# Patient Record
Sex: Male | Born: 1955
Health system: Southern US, Community
[De-identification: ages and names within clinical notes are randomized; demographics above are authoritative.]

## PROBLEM LIST (undated history)

## (undated) DIAGNOSIS — N471 Phimosis: Secondary | ICD-10-CM

## (undated) DIAGNOSIS — I10 Essential (primary) hypertension: Secondary | ICD-10-CM

## (undated) DIAGNOSIS — I509 Heart failure, unspecified: Secondary | ICD-10-CM

## (undated) DIAGNOSIS — I2510 Atherosclerotic heart disease of native coronary artery without angina pectoris: Secondary | ICD-10-CM

## (undated) DIAGNOSIS — C801 Malignant (primary) neoplasm, unspecified: Secondary | ICD-10-CM

## (undated) DIAGNOSIS — G8929 Other chronic pain: Secondary | ICD-10-CM

## (undated) HISTORY — PX: TOOTH EXTRACTION: SUR596

## (undated) HISTORY — DX: Other chronic pain: G89.29

## (undated) HISTORY — DX: Phimosis: N47.1

## (undated) HISTORY — PX: NO PAST SURGERIES: SHX2092

## (undated) HISTORY — PX: COLONOSCOPY: SHX174

---

## 2015-04-17 ENCOUNTER — Ambulatory Visit (INDEPENDENT_AMBULATORY_CARE_PROVIDER_SITE_OTHER): Payer: Self-pay | Admitting: Emergency Medicine

## 2015-04-17 VITALS — BP 152/84 | HR 76 | Temp 98.7°F | Resp 14 | Ht 74.25 in | Wt 290.4 lb

## 2015-04-17 DIAGNOSIS — R8281 Pyuria: Secondary | ICD-10-CM

## 2015-04-17 DIAGNOSIS — R350 Frequency of micturition: Secondary | ICD-10-CM

## 2015-04-17 DIAGNOSIS — N390 Urinary tract infection, site not specified: Secondary | ICD-10-CM

## 2015-04-17 DIAGNOSIS — R300 Dysuria: Secondary | ICD-10-CM

## 2015-04-17 LAB — POCT CBC
Granulocyte percent: 76.2 %G (ref 37–80)
HCT, POC: 39.7 % — AB (ref 43.5–53.7)
Hemoglobin: 14.3 g/dL (ref 14.1–18.1)
Lymph, poc: 1.2 (ref 0.6–3.4)
MCH: 29 pg (ref 27–31.2)
MCHC: 36.1 g/dL — AB (ref 31.8–35.4)
MCV: 80.3 fL (ref 80–97)
MID (CBC): 0.5 (ref 0–0.9)
MPV: 6.8 fL (ref 0–99.8)
POC Granulocyte: 5.3 (ref 2–6.9)
POC LYMPH %: 16.9 % (ref 10–50)
POC MID %: 6.9 % (ref 0–12)
Platelet Count, POC: 169 10*3/uL (ref 142–424)
RBC: 4.94 M/uL (ref 4.69–6.13)
RDW, POC: 14 %
WBC: 7 10*3/uL (ref 4.6–10.2)

## 2015-04-17 LAB — POC MICROSCOPIC URINALYSIS (UMFC): Mucus: ABSENT

## 2015-04-17 LAB — POCT URINALYSIS DIP (MANUAL ENTRY)
Nitrite, UA: POSITIVE — AB
Protein Ur, POC: 100 — AB
SPEC GRAV UA: 1.01
UROBILINOGEN UA: 2
pH, UA: 5.5

## 2015-04-17 LAB — GLUCOSE, POCT (MANUAL RESULT ENTRY): POC Glucose: 110 mg/dl — AB (ref 70–99)

## 2015-04-17 MED ORDER — CEPHALEXIN 500 MG PO CAPS: 500.0000 mg | ORAL_CAPSULE | Freq: Four times a day (QID) | ORAL | Status: DC

## 2015-04-17 NOTE — Progress Notes (Signed)
By signing my name below, I, Raven Small, attest that this documentation has been prepared under the direction and in the presence of Arlyss Queen, MD.  Electronically Signed: Thea Alken, ED Scribe. 04/17/2015. 1:57 PM.   Chief Complaint:  Chief Complaint  Patient presents with  . Dysuria    x 5 days    HPI: Frank Love is a 60 y.o. male who reports to [facility] today complaining of dysuria that began 5 days ago. Pt believes he has a UTI. He reports associated urinary frequency but states this may be due to drink a lot of water. He tried Azo with minimal relief. He denies hx of urine infections and prostate trouble. He states that it's been a long time since having a PSA. No pertinent medical hx. He denies back pain and abdominal pain.   Pt also notes some chest congestion last night. He reports having cold symptoms about 6 days ago.   He does not have primary care.   History reviewed. No pertinent past medical history. History reviewed. No pertinent past surgical history. Social History   Social History  . Marital Status: Married    Spouse Name: N/A  . Number of Children: N/A  . Years of Education: N/A   Social History Main Topics  . Smoking status: Never Smoker   . Smokeless tobacco: None  . Alcohol Use: None  . Drug Use: None  . Sexual Activity: Not Asked   Other Topics Concern  . None   Social History Narrative  . None   Family History  Problem Relation Age of Onset  . Diabetes Mother   . Heart disease Mother    Allergies not on file Prior to Admission medications   Medication Sig Start Date End Date Taking? Authorizing Provider  IBUPROFEN PO Take by mouth.   Yes Historical Provider, MD  Phenazopyridine HCl (PYRIDIUM PO) Take by mouth.   Yes Historical Provider, MD     ROS: The patient denies fevers, chills, night sweats, unintentional weight loss, chest pain, palpitations, wheezing, dyspnea on exertion, nausea, vomiting, abdominal pain, dysuria,  hematuria, melena, numbness, weakness, or tingling.   All other systems have been reviewed and were otherwise negative with the exception of those mentioned in the HPI and as above.    PHYSICAL EXAM: Filed Vitals:   04/17/15 1321  BP: 152/84  Pulse: 76  Temp: 98.7 F (37.1 C)  Resp: 14   Body mass index is 37.03 kg/(m^2).   General: Alert, no acute distress HEENT:  Normocephalic, atraumatic, oropharynx patent. Eye: [facility] Cardiovascular:  Regular rate and rhythm, no rubs murmurs or gallops.  No Carotid bruits, radial pulse intact. No pedal edema.  Respiratory: Clear to auscultation bilaterally.  No wheezes or rales.  No cyanosis, no use of accessory musculature occasional rhonchi both bases.  Abdominal: No organomegaly, abdomen is soft and non-tender, positive bowel sounds.  No masses.  Musculoskeletal: Gait intact. No edema, tenderness Skin: No rashes. Neurologic: Facial musculature symmetric. Psychiatric: Patient acts appropriately throughout our interaction. Lymphatic: No cervical or submandibular lymphadenopathy Patient is uncircumcised. He is tender over the foreskin. His prostate exam is normal.  LABS: Results for orders placed or performed in visit on 04/17/15  POCT Microscopic Urinalysis ([facility])  Result Value Ref Range   WBC,UR,HPF,POC Moderate (A) None WBC/hpf   RBC,UR,HPF,POC None None RBC/hpf   Bacteria Few (A) None, Too numerous to count   Mucus Absent Absent   Epithelial Cells, UR Per Microscopy Few (  A) None, Too numerous to count cells/hpf  POCT urinalysis dipstick  Result Value Ref Range   Color, UA orange (A) yellow   Clarity, UA clear clear   Glucose, UA =100 (A) negative   Bilirubin, UA small (A) negative   Ketones, POC UA trace (5) (A) negative   Spec Grav, UA 1.010    Blood, UA small (A) negative   pH, UA 5.5    Protein Ur, POC =100 (A) negative   Urobilinogen, UA 2.0    Nitrite, UA Positive (A) Negative   Leukocytes, UA Trace (A) Negative    POCT CBC  Result Value Ref Range   WBC 7.0 4.6 - 10.2 K/uL   Lymph, poc 1.2 0.6 - 3.4   POC LYMPH PERCENT 16.9 10 - 50 %L   MID (cbc) 0.5 0 - 0.9   POC MID % 6.9 0 - 12 %M   POC Granulocyte 5.3 2 - 6.9   Granulocyte percent 76.2 37 - 80 %G   RBC 4.94 4.69 - 6.13 M/uL   Hemoglobin 14.3 14.1 - 18.1 g/dL   HCT, POC 39.7 (A) 43.5 - 53.7 %   MCV 80.3 80 - 97 fL   MCH, POC 29.0 27 - 31.2 pg   MCHC 36.1 (A) 31.8 - 35.4 g/dL   RDW, POC 14.0 %   Platelet Count, POC 169 142 - 424 K/uL   MPV 6.8 0 - 99.8 fL  POCT glucose (manual entry)  Result Value Ref Range   POC Glucose 110 (A) 70 - 99 mg/dl    ASSESSMENT/PLAN: Patient has some tenderness over the foreskin. He denies any new sexual contacts. He is married. Will treat with cephalexin 3 times a day pending culture results.I personally performed the services described in this documentation, which was scribed in my presence. The recorded information has been reviewed and is accurate.  Gross sideeffects, risk and benefits, and alternatives of medications d/w patient. Patient is aware that all medications have potential sideeffects and we are unable to predict every sideeffect or drug-drug interaction that may occur.  Arlyss Queen MD 04/17/2015 1:54 PM

## 2015-04-17 NOTE — Patient Instructions (Addendum)
   IF you received an x-ray today, you will receive an invoice from [name]. Please contact [facility] at [phone] with questions or concerns regarding your invoice.   IF you received labwork today, you will receive an invoice from [facility]. Please contact [facility] at [phone] with questions or concerns regarding your invoice.   Our billing staff will not be able to assist you with questions regarding bills from these companies.  You will be contacted with the lab results as soon as they are available. The fastest way to get your results is to activate your My Chart account. Instructions are located on the last page of this paperwork. If you have not heard from us regarding the results in 2 weeks, please contact this office.     Urinary Tract Infection Urinary tract infections (UTIs) can develop anywhere along your urinary tract. Your urinary tract is your body's drainage system for removing wastes and extra water. Your urinary tract includes two kidneys, two ureters, a bladder, and a urethra. Your kidneys are a pair of bean-shaped organs. Each kidney is about the size of your fist. They are located below your ribs, one on each side of your spine. CAUSES Infections are caused by microbes, which are microscopic organisms, including fungi, viruses, and bacteria. These organisms are so small that they can only be seen through a microscope. Bacteria are the microbes that most commonly cause UTIs. SYMPTOMS  Symptoms of UTIs may vary by age and gender of the patient and by the location of the infection. Symptoms in young women typically include a frequent and intense urge to urinate and a painful, burning feeling in the bladder or urethra during urination. Older women and men are more likely to be tired, shaky, and weak and have muscle aches and abdominal pain. A fever may mean the infection is in your kidneys. Other symptoms of a kidney  infection include pain in your back or sides below the ribs, nausea, and vomiting. DIAGNOSIS To diagnose a UTI, your caregiver will ask you about your symptoms. Your caregiver will also ask you to provide a urine sample. The urine sample will be tested for bacteria and white blood cells. White blood cells are made by your body to help fight infection. TREATMENT  Typically, UTIs can be treated with medication. Because most UTIs are caused by a bacterial infection, they usually can be treated with the use of antibiotics. The choice of antibiotic and length of treatment depend on your symptoms and the type of bacteria causing your infection. HOME CARE INSTRUCTIONS  If you were prescribed antibiotics, take them exactly as your caregiver instructs you. Finish the medication even if you feel better after you have only taken some of the medication.  Drink enough water and fluids to keep your urine clear or pale yellow.  Avoid caffeine, tea, and carbonated beverages. They tend to irritate your bladder.  Empty your bladder often. Avoid holding urine for long periods of time.  Empty your bladder before and after sexual intercourse.  After a bowel movement, women should cleanse from front to back. Use each tissue only once. SEEK MEDICAL CARE IF:   You have back pain.  You develop a fever.  Your symptoms do not begin to resolve within 3 days. SEEK IMMEDIATE MEDICAL CARE IF:   You have severe back pain or lower abdominal pain.  You develop chills.  You have nausea or vomiting.  You have continued burning or discomfort with urination.   MAKE SURE YOU:   Understand these instructions.  Will watch your condition.  Will get help right away if you are not doing well or get worse.   This information is not intended to replace advice given to you by your health care provider. Make sure you discuss any questions you have with your health care provider.   Document Released: 10/20/2004 Document  Revised: 10/01/2014 Document Reviewed: 02/18/2011 Elsevier Interactive Patient Education 2016 Elsevier Inc.  

## 2015-04-18 LAB — PSA: PSA: 0.9 ng/mL (ref <=4.00)

## 2015-04-18 LAB — URINE CULTURE
Colony Count: NO GROWTH
Organism ID, Bacteria: NO GROWTH

## 2015-04-22 ENCOUNTER — Other Ambulatory Visit: Payer: Self-pay | Admitting: Emergency Medicine

## 2015-04-22 MED ORDER — MUPIROCIN 2 % EX OINT: TOPICAL_OINTMENT | CUTANEOUS | Status: DC

## 2015-04-27 ENCOUNTER — Other Ambulatory Visit: Payer: Self-pay | Admitting: Emergency Medicine

## 2015-04-27 ENCOUNTER — Telehealth: Payer: Self-pay

## 2015-04-27 ENCOUNTER — Telehealth: Payer: Self-pay | Admitting: Emergency Medicine

## 2015-04-27 DIAGNOSIS — N471 Phimosis: Secondary | ICD-10-CM

## 2015-04-27 NOTE — Telephone Encounter (Signed)
Patient continues to have it problems with his foreskin. The foreskin is very tight and does not want to retract. I advised he get an opinion from the urologist regarding management.

## 2015-04-27 NOTE — Telephone Encounter (Signed)
Patient request for Dr. Everlene Farrier to give him a call concerning things they talked about (UTI) [phone]

## 2015-05-01 ENCOUNTER — Telehealth: Payer: Self-pay

## 2015-05-01 NOTE — Telephone Encounter (Signed)
Dr. Daub, please review 

## 2015-05-01 NOTE — Telephone Encounter (Signed)
IC pt with message from [place] - come in next week for exam prior to writing rx as he requested. Gave pt Daub's hours on Tues, Wed and Friday. Pt concerned with price - he doesn't have ins.

## 2015-05-01 NOTE — Telephone Encounter (Signed)
Tell patient I would like to see and examine him next week before I write a prescription for stretching of the foreskin.

## 2015-05-01 NOTE — Telephone Encounter (Signed)
Msg is for Dr. Everlene Farrier, pt has an appt with urologist but the appt is 5wks from now. Pt wants to know if he can write a prescription with stretching of the foreskin? He's done research about this issue and the meds have to be prescribed.   Please advise  [phone]

## 2016-07-04 ENCOUNTER — Emergency Department (HOSPITAL_COMMUNITY)
Admission: EM | Admit: 2016-07-04 | Discharge: 2016-07-04 | Disposition: A | Discharge status: Home / Self Care | Payer: Self-pay | Attending: Emergency Medicine | Admitting: Emergency Medicine

## 2016-07-04 ENCOUNTER — Emergency Department (HOSPITAL_COMMUNITY): Payer: Self-pay

## 2016-07-04 ENCOUNTER — Encounter (HOSPITAL_COMMUNITY): Payer: Self-pay | Admitting: Emergency Medicine

## 2016-07-04 DIAGNOSIS — Z79899 Other long term (current) drug therapy: Secondary | ICD-10-CM | POA: Insufficient documentation

## 2016-07-04 DIAGNOSIS — R2240 Localized swelling, mass and lump, unspecified lower limb: Secondary | ICD-10-CM | POA: Insufficient documentation

## 2016-07-04 DIAGNOSIS — R0602 Shortness of breath: Secondary | ICD-10-CM | POA: Insufficient documentation

## 2016-07-04 LAB — CBC
HCT: 45.4 % (ref 39.0–52.0)
Hemoglobin: 15 g/dL (ref 13.0–17.0)
MCH: 28.5 pg (ref 26.0–34.0)
MCHC: 33 g/dL (ref 30.0–36.0)
MCV: 86.1 fL (ref 78.0–100.0)
PLATELETS: 178 10*3/uL (ref 150–400)
RBC: 5.27 MIL/uL (ref 4.22–5.81)
RDW: 13.9 % (ref 11.5–15.5)
WBC: 6.3 10*3/uL (ref 4.0–10.5)

## 2016-07-04 LAB — BASIC METABOLIC PANEL
Anion gap: 10 (ref 5–15)
BUN: 23 mg/dL — ABNORMAL HIGH (ref 6–20)
CHLORIDE: 105 mmol/L (ref 101–111)
CO2: 26 mmol/L (ref 22–32)
CREATININE: 1.13 mg/dL (ref 0.61–1.24)
Calcium: 9.4 mg/dL (ref 8.9–10.3)
Glucose, Bld: 115 mg/dL — ABNORMAL HIGH (ref 65–99)
POTASSIUM: 3.6 mmol/L (ref 3.5–5.1)
SODIUM: 141 mmol/L (ref 135–145)

## 2016-07-04 LAB — I-STAT TROPONIN, ED: Troponin i, poc: 0.02 ng/mL (ref 0.00–0.08)

## 2016-07-04 MED ORDER — ALBUTEROL SULFATE (2.5 MG/3ML) 0.083% IN NEBU
5.0000 mg | INHALATION_SOLUTION | Freq: Once | RESPIRATORY_TRACT | Status: AC
  Administered 2016-07-04: 5 mg via RESPIRATORY_TRACT
  Filled 2016-07-04: qty 6

## 2016-07-04 NOTE — Discharge Instructions (Signed)
Follow up with a PCP.  Weight loss could help you with these symptoms.

## 2016-07-04 NOTE — ED Triage Notes (Signed)
Pt c/o shortness of breath x 10 days mainly at night when he is sleeping. Pt has history of intermittent swelling in feet and ankles.

## 2016-07-04 NOTE — ED Notes (Signed)
Patient transported to X-ray 

## 2016-07-04 NOTE — ED Provider Notes (Signed)
[facility] DEPT Provider Note   CSN: [id number] Arrival date & time: 07/04/16  2993     History   Chief Complaint Chief Complaint  Patient presents with  . Shortness of Breath    HPI Frank Love is a 61 y.o. male.  61 yo M with a chief complaint of shortness of breath. The patient notices this right when he is falling asleep. This associated for the last 10 days or so. As he started to fall asleep he feels that he is breathing fast and this keeps him from sleeping. Had some improvement of couple days ago but then reoccurred last night. Denies orthopnea or PND. He does sleep sitting up because he has low back pain. Has done this for the past 2 years or so. Denies worsening lower extremity edema. Denies cough congestion or fever. Denies chest pain. Denies exertional symptoms.   The history is provided by the patient and the spouse.  Shortness of Breath  This is a new problem. The average episode lasts 10 days. The current episode started 2 days ago. The problem has not changed since onset.Associated symptoms include leg swelling. Pertinent negatives include no fever, no headaches, no chest pain, no vomiting, no abdominal pain and no rash. He has tried nothing for the symptoms. The treatment provided no relief.    History reviewed. No pertinent past medical history.  There are no active problems to display for this patient.   History reviewed. No pertinent surgical history.     Home Medications    Prior to Admission medications   Medication Sig Start Date End Date Taking? Authorizing Provider  cephALEXin (KEFLEX) 500 MG capsule Take 1 capsule (500 mg total) by mouth 4 (four) times daily. 04/17/15   Darlyne Russian, MD  IBUPROFEN PO Take by mouth.    [provider]  mupirocin ointment (BACTROBAN) 2 % Apply small amount to affected area twice a day 04/22/15   Darlyne Russian, MD  Phenazopyridine HCl (PYRIDIUM PO) Take by mouth.    [provider]     Family History Family History  Problem Relation Age of Onset  . Diabetes Mother   . Heart disease Mother     Social History Social History  Substance Use Topics  . Smoking status: Never Smoker  . Smokeless tobacco: Never Used  . Alcohol use No     Allergies   Patient has no known allergies.   Review of Systems Review of Systems  Constitutional: Negative for chills and fever.  HENT: Negative for congestion and facial swelling.   Eyes: Negative for discharge and visual disturbance.  Respiratory: Positive for shortness of breath.   Cardiovascular: Positive for leg swelling. Negative for chest pain and palpitations.  Gastrointestinal: Negative for abdominal pain, diarrhea and vomiting.  Musculoskeletal: Negative for arthralgias and myalgias.  Skin: Negative for color change and rash.  Neurological: Negative for tremors, syncope and headaches.  Psychiatric/Behavioral: Negative for confusion and dysphoric mood.     Physical Exam Updated Vital Signs BP 117/63   Pulse 64   Temp 97.9 F (36.6 C) (Oral)   Resp 20   Ht 6\' 3"  (1.905 m)   Wt 132.5 kg (292 lb)   SpO2 99%   BMI 36.50 kg/m   Physical Exam  Constitutional: He is oriented to person, place, and time. He appears well-developed and well-nourished.  HENT:  Head: Normocephalic and atraumatic.  Eyes: EOM are normal. Pupils are equal, round, and reactive to light.  Neck:  Normal range of motion. Neck supple. No JVD present.  Cardiovascular: Normal rate and regular rhythm.  Exam reveals no gallop and no friction rub.   No murmur heard. Pulmonary/Chest: No respiratory distress. He has no wheezes.  Abdominal: He exhibits no distension and no mass. There is no tenderness. There is no rebound and no guarding.  Musculoskeletal: Normal range of motion.  Neurological: He is alert and oriented to person, place, and time.  Skin: No rash noted. No pallor.  Psychiatric: He has a normal mood and affect. His behavior is  normal.  Nursing note and vitals reviewed.    ED Treatments / Results  Labs (all labs ordered are listed, but only abnormal results are displayed) Labs Reviewed  BASIC METABOLIC PANEL - Abnormal; Notable for the following:       Result Value   Glucose, Bld 115 (*)    BUN 23 (*)    All other components within normal limits  CBC  I-STAT TROPOININ, ED    EKG  EKG Interpretation  Date/Time:  Monday July 04 2016 06:27:44 EDT Ventricular Rate:  70 PR Interval:  158 QRS Duration: 106 QT Interval:  424 QTC Calculation: 457 R Axis:   -28 Text Interpretation:  Normal sinus rhythm Inferior infarct , age undetermined Anterolateral infarct , age undetermined Abnormal ECG No old tracing to compare Confirmed by Deno Etienne [phone]) on 07/04/2016 7:02:16 AM       Radiology Dg Chest 2 View  Result Date: 07/04/2016 CLINICAL DATA:  Acute onset of shortness of breath and chronic intermittent lower leg swelling. Initial encounter. EXAM: CHEST  2 VIEW COMPARISON:  None. FINDINGS: The lungs are well-aerated. Mild bibasilar airspace opacities may reflect atelectasis or mild interstitial edema. There is no evidence of pleural effusion or pneumothorax. The heart is borderline normal in size. No acute osseous abnormalities are seen. IMPRESSION: Mild bibasilar airspace opacities may reflect atelectasis or mild interstitial edema. Electronically Signed   By: Garald Balding M.D.   On: 07/04/2016 06:48    Procedures Procedures (including critical care time)  Medications Ordered in ED Medications  albuterol (PROVENTIL) (2.5 MG/3ML) 0.083% nebulizer solution 5 mg (5 mg Nebulization Given 07/04/16 0704)     Initial Impression / Assessment and Plan / ED Course  I have reviewed the triage vital signs and the nursing notes.  Pertinent labs & imaging results that were available during my care of the patient were reviewed by me and considered in my medical decision making (see chart for details).     61  yo M With a chief complaint of shortness of breath. This only happens right before he goes to sleep. I suspect that the patient has undiagnosed sleep apnea.. Chest x-ray here that was unremarkable. Labs are reassuring. Patient does not have a family physician. I suggested the importance of following up and discuss the hotline number that we have.  3:13 PM:  I have discussed the diagnosis/risks/treatment options with the patient and family and believe the pt to be eligible for discharge home to follow-up with PCP. We also discussed returning to the ED immediately if new or worsening sx occur. We discussed the sx which are most concerning (e.g., sudden worsening pain, fever, inability to tolerate by mouth) that necessitate immediate return. Medications administered to the patient during their visit and any new prescriptions provided to the patient are listed below.  Medications given during this visit Medications  albuterol (PROVENTIL) (2.5 MG/3ML) 0.083% nebulizer solution 5 mg (5 mg Nebulization  Given 07/04/16 0704)     The patient appears reasonably screen and/or stabilized for discharge and I doubt any other medical condition or other [facility] requiring further screening, evaluation, or treatment in the ED at this time prior to discharge.    Final Clinical Impressions(s) / ED Diagnoses   Final diagnoses:  Shortness of breath    New Prescriptions Discharge Medication List as of 07/04/2016  8:03 AM       Deno Etienne, DO 07/04/16 1513

## 2016-07-04 NOTE — ED Notes (Signed)
Returned from xray

## 2017-01-26 ENCOUNTER — Ambulatory Visit: Payer: Self-pay | Admitting: Nurse Practitioner

## 2017-01-26 VITALS — BP 135/100 | HR 67 | Temp 98.7°F | Resp 16 | Wt 295.8 lb

## 2017-01-26 DIAGNOSIS — N390 Urinary tract infection, site not specified: Secondary | ICD-10-CM

## 2017-01-26 DIAGNOSIS — R350 Frequency of micturition: Secondary | ICD-10-CM

## 2017-01-26 DIAGNOSIS — R319 Hematuria, unspecified: Secondary | ICD-10-CM

## 2017-01-26 LAB — POCT URINALYSIS DIPSTICK
Glucose, UA: NEGATIVE
Ketones, UA: NEGATIVE
NITRITE UA: POSITIVE
PROTEIN UA: NEGATIVE
SPEC GRAV UA: 1.01 (ref 1.010–1.025)
Urobilinogen, UA: 0.2 E.U./dL
pH, UA: 7 (ref 5.0–8.0)

## 2017-01-26 MED ORDER — NITROFURANTOIN MONOHYD MACRO 100 MG PO CAPS: 100.0000 mg | ORAL_CAPSULE | Freq: Two times a day (BID) | ORAL | 0 refills | Status: DC

## 2017-01-26 MED ORDER — TAMSULOSIN HCL 0.4 MG PO CAPS: 0.4000 mg | ORAL_CAPSULE | Freq: Every day | ORAL | 0 refills | Status: AC

## 2017-01-26 MED ORDER — PHENAZOPYRIDINE HCL 100 MG PO TABS: 100.0000 mg | ORAL_TABLET | Freq: Three times a day (TID) | ORAL | 0 refills | Status: AC | PRN

## 2017-01-26 NOTE — Patient Instructions (Addendum)
Urinary Tract Infection, Adult  A urinary tract infection (UTI) is an infection of any part of the urinary tract, which includes the kidneys, ureters, bladder, and urethra. These organs make, store, and get rid of urine in the body. UTI can be a bladder infection (cystitis) or kidney infection (pyelonephritis).  What are the causes?  This infection may be caused by fungi, viruses, or bacteria. Bacteria are the most common cause of UTIs. This condition can also be caused by repeated incomplete emptying of the bladder during urination.  What increases the risk?  This condition is more likely to develop if:   You ignore your need to urinate or hold urine for long periods of time.   You do not empty your bladder completely during urination.   You wipe back to front after urinating or having a bowel movement, if you are male.   You are uncircumcised, if you are male.   You are constipated.   You have a urinary catheter that stays in place (indwelling).   You have a weak defense (immune) system.   You have a medical condition that affects your bowels, kidneys, or bladder.   You have diabetes.   You take antibiotic medicines frequently or for long periods of time, and the antibiotics no longer work well against certain types of infections (antibiotic resistance).   You take medicines that irritate your urinary tract.   You are exposed to chemicals that irritate your urinary tract.   You are male.    What are the signs or symptoms?  Symptoms of this condition include:   Fever.   Frequent urination or passing small amounts of urine frequently.   Needing to urinate urgently.   Pain or burning with urination.   Urine that smells bad or unusual.   Cloudy urine.   Pain in the lower abdomen or back.   Trouble urinating.   Blood in the urine.   Vomiting or being less hungry than normal.   Diarrhea or abdominal pain.   Vaginal discharge, if you are male.    How is this diagnosed?  This condition is  diagnosed with a medical history and physical exam. You will also need to provide a urine sample to test your urine. Other tests may be done, including:   Blood tests.   Sexually transmitted disease (STD) testing.    If you have had more than one UTI, a cystoscopy or imaging studies may be done to determine the cause of the infections.  How is this treated?  Treatment for this condition often includes a combination of two or more of the following:   Antibiotic medicine.   Other medicines to treat less common causes of UTI.   Over-the-counter medicines to treat pain.   Drinking enough water to stay hydrated.    Follow these instructions at home:   Take over-the-counter and prescription medicines only as told by your health care provider.   If you were prescribed an antibiotic, take it as told by your health care provider. Do not stop taking the antibiotic even if you start to feel better.   Avoid alcohol, caffeine, tea, and carbonated beverages. They can irritate your bladder.   Drink enough fluid to keep your urine clear or pale yellow.   Keep all follow-up visits as told by your health care provider. This is important.   Make sure to:  ? Empty your bladder often and completely. Do not hold urine for long periods of time.  ?   Empty your bladder before and after sex.  ? Wipe from front to back after a bowel movement if you are male. Use each tissue one time when you wipe.  Contact a health care provider if:   You have back pain.   You have a fever.   You feel nauseous or vomit.   Your symptoms do not get better after 3 days.   Your symptoms go away and then return.  Get help right away if:   You have severe back pain or lower abdominal pain.   You are vomiting and cannot keep down any medicines or water.  This information is not intended to replace advice given to you by your health care provider. Make sure you discuss any questions you have with your health care provider.  Document Released:  10/20/2004 Document Revised: 06/24/2015 Document Reviewed: 12/01/2014  Elsevier Interactive Patient Education  2018 [employer] Inc[employer]

## 2017-01-26 NOTE — Progress Notes (Signed)
Subjective:    Frank Love is a 62 y.o. male who complains of dysuria, hesitancy, nocturia and burning after urination for 2 weeks.  Patient also complains of a narrowed urethra. Patient denies back pain, fever and stomach ache.  Patient does not have a history of recurrent UTI.  Patient does not have a history of pyelonephritis.  Patient endorses a history of phimosis, but has not seen a urologist due to the cost.  Patient admits to history of phimosis and was informed approximately a year ago that he may need to follow up with urologist.  Patient does endorse some type of d/c when he doesn't toilet regularly (exceeding 3 hours) and states it feels like "the skin is stuck together.  Patient further denies prostate issues, prostate exam last year was normal.  The following portions of the patient's history were reviewed and updated as appropriate: allergies, current medications and past medical history. Review of Systems Constitutional: negative Respiratory: negative Cardiovascular: negative Gastrointestinal: negative for abdominal pain, nausea, vomiting and back pain Genitourinary:positive for phimosis, decreased stream, dysuria, frequency, nocturia and balanitis, negative for penile discharge Behavioral/Psych: negative    Objective:    BP (!) 135/100 (BP Location: Left Arm, Patient Position: Sitting, Cuff Size: Large)   Pulse 67   Temp 98.7 F (37.1 C) (Oral)   Resp 16   Wt 295 lb 12.8 oz (134.2 kg)   SpO2 97%   BMI 36.97 kg/m  General: alert and cooperative  Abdomen: soft, non-tender, without masses or organomegaly in the entire abdomen  Back: back muscles are full ROM, no CVA tenderness bilaterally  GU: defer exam   Laboratory:  Urine dipstick shows see urinalysis results.   Micro exam: not done.    Assessment:    Acute cystitis    Plan: Plan:    1. Medications: nitrofurantoin 2. Maintain adequate hydration 3. Follow up if symptoms not improving, and prn.   4.  Pyridium for dysuria. 5. Discussed prevention of UTI with patient, frequent toileting, cotton underwear, etc.  Also discussed plans to follow up with urologist.  Patient verbalizes understanding.

## 2017-01-27 ENCOUNTER — Telehealth: Payer: Self-pay | Admitting: Emergency Medicine

## 2017-01-27 NOTE — Telephone Encounter (Signed)
Pt called to ask questions regarding his medication. Provided education and encouraged follow up at [facility] or ED if symptoms persist or worsen

## 2017-01-30 ENCOUNTER — Other Ambulatory Visit: Payer: Self-pay | Admitting: Nurse Practitioner

## 2017-01-30 MED ORDER — NITROFURANTOIN MONOHYD MACRO 100 MG PO CAPS: 100.0000 mg | ORAL_CAPSULE | Freq: Two times a day (BID) | ORAL | 0 refills | Status: AC

## 2017-01-30 NOTE — Progress Notes (Signed)
Patient called stating he still had the same urinary symptoms.  Requesting addt'l abx.  Informed patient will call addt'l Macrobid to pharmacy on file.

## 2018-04-23 ENCOUNTER — Ambulatory Visit: Payer: Self-pay | Admitting: Nurse Practitioner

## 2018-06-11 ENCOUNTER — Ambulatory Visit: Payer: Self-pay | Admitting: Nurse Practitioner

## 2018-06-12 ENCOUNTER — Ambulatory Visit: Payer: Self-pay | Attending: Nurse Practitioner | Admitting: Nurse Practitioner

## 2018-06-12 ENCOUNTER — Encounter: Payer: Self-pay | Admitting: Nurse Practitioner

## 2018-06-12 ENCOUNTER — Other Ambulatory Visit: Payer: Self-pay

## 2018-06-12 ENCOUNTER — Other Ambulatory Visit: Payer: Self-pay | Admitting: Nurse Practitioner

## 2018-06-12 VITALS — Ht 74.0 in | Wt 254.0 lb

## 2018-06-12 DIAGNOSIS — R7309 Other abnormal glucose: Secondary | ICD-10-CM

## 2018-06-12 DIAGNOSIS — E669 Obesity, unspecified: Secondary | ICD-10-CM

## 2018-06-12 DIAGNOSIS — R030 Elevated blood-pressure reading, without diagnosis of hypertension: Secondary | ICD-10-CM

## 2018-06-12 DIAGNOSIS — M1711 Unilateral primary osteoarthritis, right knee: Secondary | ICD-10-CM

## 2018-06-12 DIAGNOSIS — R2243 Localized swelling, mass and lump, lower limb, bilateral: Secondary | ICD-10-CM

## 2018-06-12 NOTE — Progress Notes (Signed)
Virtual Visit via Telephone Note Due to national recommendations of social distancing due to [place] 19, telehealth visit is felt to be most appropriate for this patient at this time.  I discussed the limitations, risks, security and privacy concerns of performing an evaluation and management service by telephone and the availability of in person appointments. I also discussed with the patient that there may be a patient responsible charge related to this service. The patient expressed understanding and agreed to proceed.    I connected with Frank Love on 06/12/18  at   9:30 AM EDT  EDT by telephone and verified that I am speaking with the correct person using two identifiers.   Consent I discussed the limitations, risks, security and privacy concerns of performing an evaluation and management service by telephone and the availability of in person appointments. I also discussed with the patient that there may be a patient responsible charge related to this service. The patient expressed understanding and agreed to proceed.   Location of Patient: Private Residence    Location of Provider: [place] [facility] [employer] Office    Persons participating in Telemedicine visit: Geryl Rankins FNP-BC [place]    History of Present Illness: Telemedicine visit for: Establish care He has a history of chronic Right knee pain (diagnosed with OA). Seen at Urgent care on 03-17-2018 and referred to Ortho for possible knee injections. Was instructed to stop NSAIDs and try tylenol for pain. Unfortunately he is uninsured and was unable to follow up with ortho.  Past Medical History:  Diagnosis Date  . Chronic pain of right knee   . Phimosis     Knee Pain Patient presents with complaints of knee pain and sweling involving the  bilateral knees and worse with right. He also endorses increased swelling in the left foot and swelling in both legs into thighs. He states  compression socks are too tight and he has not been able to find a pair that fit. Onset of the symptoms was 7 months ago with worsening over the past several weeks. Inciting event: none known. Pain is aggravated by any weight bearing.  Patient has had prior knee problems. Evaluation to date: none. Treatment to date: OTC analgesics which are ineffective. He is currently using a cane to walk. Hears "crunching" in the knee at times with ambulation. Denies any chest pain, shortness of breath, BLE erythema or warmth. BMI 32. Ht 6'2 weight 253 down from 296 with intentional weight loss over the past 5 months.    Elevated Blood Pressure States blood pressure range at home 120-151/70-80s. Denies chest pain, shortness of breath, palpitations, lightheadedness, dizziness, headaches. Endorses BLE edema and high sodium diet. Currently not taking any antihypertensives.  Will check at next office visit. May need to start HCTZ or chlorthalidone. BP Readings from Last 3 Encounters:  01/26/17 (!) 135/100  07/04/16 117/63  04/17/15 (!) 152/84    Past Surgical History:  Procedure Laterality Date  . NO PAST SURGERIES      Family History  Problem Relation Age of Onset  . Diabetes Mother   . Heart disease Mother     Social History   Socioeconomic History  . Marital status: Married    Spouse name: Not on file  . Number of children: Not on file  . Years of education: Not on file  . Highest education level: Not on file  Occupational History  . Not on file  Social Needs  . Financial  resource strain: Not on file  . Food insecurity:    Worry: Not on file    Inability: Not on file  . Transportation needs:    Medical: Not on file    Non-medical: Not on file  Tobacco Use  . Smoking status: Never Smoker  . Smokeless tobacco: Never Used  Substance and Sexual Activity  . Alcohol use: No    Alcohol/week: 0.0 standard drinks  . Drug use: No  . Sexual activity: Not on file  Lifestyle  . Physical activity:     Days per week: Not on file    Minutes per session: Not on file  . Stress: Not on file  Relationships  . Social connections:    Talks on phone: Not on file    Gets together: Not on file    Attends religious service: Not on file    Active member of club or organization: Not on file    Attends meetings of clubs or organizations: Not on file    Relationship status: Not on file  Other Topics Concern  . Not on file  Social History Narrative  . Not on file     Observations/Objective: Awake, alert and oriented x 3   ROS  Assessment and Plan: Joanne was seen today for new patient (initial visit).  Diagnoses and all orders for this visit:  Osteoarthritis of right knee, unspecified osteoarthritis type Awaiting labs  May need ortho referral   Localized swelling of both lower legs -     CBC -     CMP14+EGFR -     Brain natriuretic peptide  Elevated glucose level -     Hemoglobin A1c  Elevated blood pressure reading Recheck at next office visit  Remember to bring in your blood pressure log with you for your follow up appointment.  DASH/Mediterranean Diets are healthier choices for HTN.    Obesity (BMI 30-39.9) -     Lipid panel Discussed diet and exercise for person with BMI >30. Instructed: You must burn more calories than you eat. Losing 5 percent of your body weight should be considered a success. In the longer term, losing more than 15 percent of your body weight and staying at this weight is an extremely good result. However, keep in mind that even losing 5 percent of your body weight leads to important health benefits, so try not to get discouraged if you're not able to lose more than this. Will recheck weight in 3-6 months.     Follow Up Instructions Return in about 1 week (around 06/19/2018).     I discussed the assessment and treatment plan with the patient. The patient was provided an opportunity to ask questions and all were answered. The patient agreed with  the plan and demonstrated an understanding of the instructions.   The patient was advised to call back or seek an in-person evaluation if the symptoms worsen or if the condition fails to improve as anticipated.  I provided 35 minutes of non-face-to-face time during this encounter including median intraservice time, reviewing previous notes, labs, imaging, medications and explaining diagnosis and management.  [name], FNP-BC

## 2018-06-13 ENCOUNTER — Ambulatory Visit: Payer: Self-pay | Attending: Family Medicine

## 2018-06-13 ENCOUNTER — Other Ambulatory Visit: Payer: Self-pay

## 2018-06-14 LAB — CMP14+EGFR
ALT: 16 IU/L (ref 0–44)
AST: 15 IU/L (ref 0–40)
Albumin/Globulin Ratio: 1.4 (ref 1.2–2.2)
Albumin: 4.2 g/dL (ref 3.8–4.8)
Alkaline Phosphatase: 87 IU/L (ref 39–117)
BUN/Creatinine Ratio: 17 (ref 10–24)
BUN: 18 mg/dL (ref 8–27)
Bilirubin Total: 1.3 mg/dL — ABNORMAL HIGH (ref 0.0–1.2)
CO2: 24 mmol/L (ref 20–29)
Calcium: 9.4 mg/dL (ref 8.6–10.2)
Chloride: 105 mmol/L (ref 96–106)
Creatinine, Ser: 1.07 mg/dL (ref 0.76–1.27)
GFR calc Af Amer: 86 mL/min/{1.73_m2} (ref >59)
GFR calc non Af Amer: 74 mL/min/{1.73_m2} (ref >59)
Globulin, Total: 2.9 g/dL (ref 1.5–4.5)
Glucose: 138 mg/dL — ABNORMAL HIGH (ref 65–99)
Potassium: 4 mmol/L (ref 3.5–5.2)
Sodium: 145 mmol/L — ABNORMAL HIGH (ref 134–144)
Total Protein: 7.1 g/dL (ref 6.0–8.5)

## 2018-06-14 LAB — LIPID PANEL
Chol/HDL Ratio: 2.8 ratio (ref 0.0–5.0)
Cholesterol, Total: 144 mg/dL (ref 100–199)
HDL: 51 mg/dL (ref >39)
LDL Calculated: 83 mg/dL (ref 0–99)
Triglycerides: 49 mg/dL (ref 0–149)
VLDL Cholesterol Cal: 10 mg/dL (ref 5–40)

## 2018-06-14 LAB — CBC
Hematocrit: 41.1 % (ref 37.5–51.0)
Hemoglobin: 13.7 g/dL (ref 13.0–17.7)
MCH: 29 pg (ref 26.6–33.0)
MCHC: 33.3 g/dL (ref 31.5–35.7)
MCV: 87 fL (ref 79–97)
Platelets: 147 10*3/uL — ABNORMAL LOW (ref 150–450)
RBC: 4.73 x10E6/uL (ref 4.14–5.80)
RDW: 14.5 % (ref 11.6–15.4)
WBC: 5.2 10*3/uL (ref 3.4–10.8)

## 2018-06-14 LAB — HEMOGLOBIN A1C
Est. average glucose Bld gHb Est-mCnc: 111 mg/dL
Hgb A1c MFr Bld: 5.5 % (ref 4.8–5.6)

## 2018-06-14 LAB — BRAIN NATRIURETIC PEPTIDE: BNP: 1555.9 pg/mL — ABNORMAL HIGH (ref 0.0–100.0)

## 2018-06-15 ENCOUNTER — Encounter: Payer: Self-pay | Admitting: Nurse Practitioner

## 2018-06-15 ENCOUNTER — Other Ambulatory Visit: Payer: Self-pay | Admitting: Nurse Practitioner

## 2018-06-15 ENCOUNTER — Ambulatory Visit: Payer: Self-pay | Attending: Nurse Practitioner | Admitting: Nurse Practitioner

## 2018-06-15 ENCOUNTER — Other Ambulatory Visit: Payer: Self-pay

## 2018-06-15 DIAGNOSIS — I10 Essential (primary) hypertension: Secondary | ICD-10-CM

## 2018-06-15 DIAGNOSIS — R7989 Other specified abnormal findings of blood chemistry: Secondary | ICD-10-CM

## 2018-06-15 DIAGNOSIS — E669 Obesity, unspecified: Secondary | ICD-10-CM

## 2018-06-15 MED ORDER — LISINOPRIL 40 MG PO TABS: 40.0000 mg | ORAL_TABLET | Freq: Every day | ORAL | 1 refills | Status: DC

## 2018-06-15 MED ORDER — METOPROLOL TARTRATE 50 MG PO TABS: 50.0000 mg | ORAL_TABLET | Freq: Two times a day (BID) | ORAL | 1 refills | Status: DC

## 2018-06-15 MED ORDER — FUROSEMIDE 20 MG PO TABS: 20.0000 mg | ORAL_TABLET | Freq: Every day | ORAL | 0 refills | Status: DC

## 2018-06-15 NOTE — Progress Notes (Signed)
Virtual Visit via Telephone Note Due to national recommendations of social distancing due to [place] 19, telehealth visit is felt to be most appropriate for this patient at this time.  I discussed the limitations, risks, security and privacy concerns of performing an evaluation and management service by telephone and the availability of in person appointments. I also discussed with the patient that there may be a patient responsible charge related to this service. The patient expressed understanding and agreed to proceed.      I connected with Frank Love on 06/15/18  at  3:28 PM EDT  EDT by telephone and verified that I am speaking with the corre ct person using two identifiers.   Consent I discussed the limitations, risks, security and privacy concerns of performing an evaluation and management service by telephone and the availability of in person appointments. I also discussed with the patient that there may be a patient responsible charge related to this service. The patient expressed understanding and agreed to proceed.   Location of Patient: Private Residence   Location of Provider: [place] participating in Telemedicine visit: Geryl Rankins FNP-BC [place]    History of Present Illness: Telemedicine visit for: follow up to labs  Frank Love and I went over his labs in detail today. We also discussed possible his likely congestive heart failure and currently elevated BNP.  Dietary modifications were discussed as well as recommendations for him to wear his compression socks during the day. He currently denies chest pain, palpitations, lightheadedness, dizziness, headaches or visual disturbances. Does endorse some shortness of breath with moderate exertion. He has a blood pressure monitor and has been instructed to check his blood pressure daily and was given parameters as to when to call the office.    Essential Hypertension Chronic and not well controlled. He endorses SBP as high as 150s. He is not dietary or exercise compliant.  BP Readings from Last 3 Encounters:  01/26/17 (!) 135/100  07/04/16 117/63  04/17/15 (!) 152/84    Past Medical History:  Diagnosis Date  . Chronic pain of right knee   . Phimosis     Past Surgical History:  Procedure Laterality Date  . NO PAST SURGERIES      Family History  Problem Relation Age of Onset  . Diabetes Mother   . Heart disease Mother     Social History   Socioeconomic History  . Marital status: Married    Spouse name: Not on file  . Number of children: Not on file  . Years of education: Not on file  . Highest education level: Not on file  Occupational History  . Not on file  Social Needs  . Financial resource strain: Not on file  . Food insecurity:    Worry: Not on file    Inability: Not on file  . Transportation needs:    Medical: Not on file    Non-medical: Not on file  Tobacco Use  . Smoking status: Never Smoker  . Smokeless tobacco: Never Used  Substance and Sexual Activity  . Alcohol use: No    Alcohol/week: 0.0 standard drinks  . Drug use: No  . Sexual activity: Not on file  Lifestyle  . Physical activity:    Days per week: Not on file    Minutes per session: Not on file  . Stress: Not on file  Relationships  . Social connections:    Talks  on phone: Not on file    Gets together: Not on file    Attends religious service: Not on file    Active member of club or organization: Not on file    Attends meetings of clubs or organizations: Not on file    Relationship status: Not on file  Other Topics Concern  . Not on file  Social History Narrative  . Not on file     Observations/Objective: Awake, alert and oriented x 3   Review of Systems  Constitutional: Negative for fever, malaise/fatigue and weight loss.  HENT: Negative.  Negative for nosebleeds.   Eyes: Negative.  Negative for blurred vision,  double vision and photophobia.  Respiratory: Positive for shortness of breath. Negative for cough, hemoptysis, sputum production and wheezing.   Cardiovascular: Positive for leg swelling. Negative for chest pain and palpitations.  Gastrointestinal: Negative.  Negative for heartburn, nausea and vomiting.  Musculoskeletal: Negative.  Negative for myalgias.  Neurological: Negative.  Negative for dizziness, focal weakness, seizures and headaches.  Psychiatric/Behavioral: Negative.  Negative for suicidal ideas.    Assessment and Plan: Frank Love was evaluated today for knee pain.  Diagnoses and all orders for this visit:  Essential hypertension Continue all antihypertensives as prescribed.  Remember to bring in your blood pressure log with you for your follow up appointment.  DASH/Mediterranean Diets are healthier choices for HTN.  Lisinopril 40mg  daily  Elevated brain natriuretic peptide (BNP) level Metoprolol tartrate 50 mg BID Lasix 20mg  daily     Follow Up Instructions Return in about 4 weeks (around 07/13/2018) for BP recheck BNP EKG.     I discussed the assessment and treatment plan with the patient. The patient was provided an opportunity to ask questions and all were answered. The patient agreed with the plan and demonstrated an understanding of the instructions.   The patient was advised to call back or seek an in-person evaluation if the symptoms worsen or if the condition fails to improve as anticipated.  Total time spent with patient was 29 min. Greater than 50 % of this tele visit was spent counseling and coordinating care regarding risk factor modification, compliance importance and encouragement, education related to HTN, Obesity and heart failure.  [name], FNP-BC

## 2018-06-20 ENCOUNTER — Telehealth: Payer: Self-pay | Admitting: Nurse Practitioner

## 2018-06-20 NOTE — Telephone Encounter (Signed)
Patients call returned.  Patient identified by name and date of birth.  Patient states that he feels water is collecting in his right thigh.  Patient is taking medication as prescribed.  Patient states he is able to ambulate with can but is unable to get out of car due to pain.  Patient thus can't work.  Patient asking what is the next step.  Patient's questions answered about processes and medication.  Patient advise that PCP would be contacted and any advise would be forwarded.  Patient acknowledged understanding of advice.

## 2018-06-20 NOTE — Telephone Encounter (Signed)
Pt was offered a televisit but requested to speak to a nurse, called stating that his knee is swelling and states its urgent please follow up

## 2018-06-21 ENCOUNTER — Encounter: Payer: Self-pay | Admitting: Nurse Practitioner

## 2018-06-21 NOTE — Telephone Encounter (Signed)
Nurse called the patient's home phone number but received no answer and message was left on the voicemail for the patient to call back.  Return phone number given. 

## 2018-06-22 NOTE — Telephone Encounter (Signed)
[employer] scheduled with PCP on 06/25/2018.  Had concerns about knee pain and fluid being in knee.  Informed patient that writer was going to send out a financial counseling information. He could complete and make an appointment for virtual visits are being accommodated at this time.   Instructed to speak with PCP about concerns with leg swelling and medication- Furosemide.

## 2018-06-25 ENCOUNTER — Ambulatory Visit: Payer: Self-pay | Attending: Nurse Practitioner | Admitting: Nurse Practitioner

## 2018-06-25 ENCOUNTER — Encounter: Payer: Self-pay | Admitting: Nurse Practitioner

## 2018-06-25 ENCOUNTER — Other Ambulatory Visit: Payer: Self-pay

## 2018-06-25 DIAGNOSIS — G8929 Other chronic pain: Secondary | ICD-10-CM

## 2018-06-25 DIAGNOSIS — M25561 Pain in right knee: Secondary | ICD-10-CM

## 2018-06-25 NOTE — Progress Notes (Signed)
Virtual Visit via Telephone Note Due to national recommendations of social distancing due to [place] 19, telehealth visit is felt to be most appropriate for this patient at this time.  I discussed the limitations, risks, security and privacy concerns of performing an evaluation and management service by telephone and the availability of in person appointments. I also discussed with the patient that there may be a patient responsible charge related to this service. The patient expressed understanding and agreed to proceed.    I connected with Keeghan Bialy Quint on 06/25/18  at   2:50 PM EDT  EDT by telephone and verified that I am speaking with the correct person using two identifiers.   Consent I discussed the limitations, risks, security and privacy concerns of performing an evaluation and management service by telephone and the availability of in person appointments. I also discussed with the patient that there may be a patient responsible charge related to this service. The patient expressed understanding and agreed to proceed.   Location of Patient: Private Residence   Location of Provider: [place] participating in Telemedicine visit: Geryl Rankins FNP-BC [place]    History of Present Illness: Telemedicine visit for: Right Knee pain   Right knee pain Chronic. He has a history of chronic Right knee pain (diagnosed with OA). Seen at Urgent care on 03-17-2018 and referred to Ortho for possible knee injections. Was instructed to stop NSAIDs and try tylenol for pain. Unfortunately he is uninsured and has been unable to follow up with ortho.   Onset 8 months ago with worsening over the past several weeks. Inciting event: none. Aggravated factors: weight bearing. Other symptoms include swelling, popping sensation and crunching sound with ambulation. There is also left knee pain however worse with right knee. Previous  treatments: OTC analgesics which have been ineffective. Using a cane to ambulate. Denies any redness, warmth, or inability to weight bear.    Past Medical History:  Diagnosis Date  . Chronic pain of right knee   . Phimosis     Past Surgical History:  Procedure Laterality Date  . NO PAST SURGERIES      Family History  Problem Relation Age of Onset  . Diabetes Mother   . Heart disease Mother     Social History   Socioeconomic History  . Marital status: Married    Spouse name: Not on file  . Number of children: Not on file  . Years of education: Not on file  . Highest education level: Not on file  Occupational History  . Not on file  Social Needs  . Financial resource strain: Not on file  . Food insecurity:    Worry: Not on file    Inability: Not on file  . Transportation needs:    Medical: Not on file    Non-medical: Not on file  Tobacco Use  . Smoking status: Never Smoker  . Smokeless tobacco: Never Used  Substance and Sexual Activity  . Alcohol use: No    Alcohol/week: 0.0 standard drinks  . Drug use: No  . Sexual activity: Not on file  Lifestyle  . Physical activity:    Days per week: Not on file    Minutes per session: Not on file  . Stress: Not on file  Relationships  . Social connections:    Talks on phone: Not on file    Gets together: Not on file    Attends  religious service: Not on file    Active member of club or organization: Not on file    Attends meetings of clubs or organizations: Not on file    Relationship status: Not on file  Other Topics Concern  . Not on file  Social History Narrative  . Not on file     Observations/Objective: Awake, alert and oriented x 3   Review of Systems  Constitutional: Positive for malaise/fatigue. Negative for fever and weight loss.  HENT: Negative.  Negative for nosebleeds.   Eyes: Negative.  Negative for blurred vision, double vision and photophobia.  Respiratory: Positive for shortness of breath.  Negative for cough.   Cardiovascular: Positive for leg swelling. Negative for chest pain and palpitations.  Gastrointestinal: Negative.  Negative for heartburn, nausea and vomiting.  Musculoskeletal: Positive for joint pain. Negative for myalgias.  Neurological: Negative.  Negative for dizziness, focal weakness, seizures and headaches.  Psychiatric/Behavioral: Negative.  Negative for suicidal ideas.    Assessment and Plan: Pearce was seen today for follow-up.  Diagnoses and all orders for this visit:  Chronic pain of right knee -     DG Knee 1-2 Views Right; Future Work on losing weight to help reduce joint pain. May alternate with heat and ice application for pain relief. May also alternate with acetaminophen and OTC Ibuprofen as prescribed pain relief. Other alternatives include massage, acupuncture and water aerobics.  You must stay active and avoid a sedentary lifestyle.    Follow Up Instructions Return in about 3 weeks (around 07/16/2018) for repeat labs on lasix BMP.     I discussed the assessment and treatment plan with the patient. The patient was provided an opportunity to ask questions and all were answered. The patient agreed with the plan and demonstrated an understanding of the instructions.   The patient was advised to call back or seek an in-person evaluation if the symptoms worsen or if the condition fails to improve as anticipated.  I provided 24 minutes of non-face-to-face time during this encounter including median intraservice time, reviewing previous notes, labs, imaging, medications and explaining diagnosis and management.  [name], FNP-BC

## 2018-06-26 ENCOUNTER — Ambulatory Visit (HOSPITAL_COMMUNITY)
Admission: RE | Admit: 2018-06-26 | Discharge: 2018-06-26 | Disposition: A | Discharge status: Home / Self Care | Payer: Self-pay | Source: Ambulatory Visit | Attending: Nurse Practitioner | Admitting: Nurse Practitioner

## 2018-06-26 ENCOUNTER — Other Ambulatory Visit: Payer: Self-pay

## 2018-06-26 DIAGNOSIS — G8929 Other chronic pain: Secondary | ICD-10-CM | POA: Insufficient documentation

## 2018-06-26 DIAGNOSIS — M25561 Pain in right knee: Secondary | ICD-10-CM | POA: Insufficient documentation

## 2018-06-29 ENCOUNTER — Telehealth: Payer: Self-pay | Admitting: Nurse Practitioner

## 2018-06-29 NOTE — Telephone Encounter (Signed)
Will route to PCP 

## 2018-06-29 NOTE — Telephone Encounter (Signed)
New Message   Pt states he was diagnosed with CHF and wants to know if he can do any exercises to eliminate the fluid on his body. Please f/u

## 2018-06-30 ENCOUNTER — Encounter: Payer: Self-pay | Admitting: Nurse Practitioner

## 2018-07-03 NOTE — Telephone Encounter (Signed)
Spoke with patient via mychart.

## 2018-07-06 ENCOUNTER — Other Ambulatory Visit: Payer: Self-pay

## 2018-07-06 ENCOUNTER — Ambulatory Visit: Payer: Self-pay | Attending: Nurse Practitioner | Admitting: Nurse Practitioner

## 2018-07-06 ENCOUNTER — Encounter: Payer: Self-pay | Admitting: Nurse Practitioner

## 2018-07-06 VITALS — BP 125/73 | HR 51 | Temp 98.1°F | Resp 16 | Wt 247.0 lb

## 2018-07-06 DIAGNOSIS — I10 Essential (primary) hypertension: Secondary | ICD-10-CM | POA: Insufficient documentation

## 2018-07-06 DIAGNOSIS — R7989 Other specified abnormal findings of blood chemistry: Secondary | ICD-10-CM | POA: Insufficient documentation

## 2018-07-06 DIAGNOSIS — R600 Localized edema: Secondary | ICD-10-CM | POA: Insufficient documentation

## 2018-07-06 MED ORDER — METOPROLOL TARTRATE 25 MG PO TABS: 25.0000 mg | ORAL_TABLET | Freq: Two times a day (BID) | ORAL | 2 refills | Status: DC

## 2018-07-06 MED ORDER — LISINOPRIL 40 MG PO TABS: 40.0000 mg | ORAL_TABLET | Freq: Every day | ORAL | 1 refills | Status: DC

## 2018-07-06 MED ORDER — FUROSEMIDE 40 MG PO TABS
40.0000 mg | ORAL_TABLET | Freq: Every day | ORAL | 0 refills | Status: DC
Start: 2018-07-06 — End: 2018-08-06

## 2018-07-06 MED ORDER — METOPROLOL TARTRATE 50 MG PO TABS: 50.0000 mg | ORAL_TABLET | Freq: Two times a day (BID) | ORAL | 1 refills | Status: DC

## 2018-07-06 NOTE — Progress Notes (Signed)
Assessment & Plan:  Frank Love was seen today for follow-up.  Diagnoses and all orders for this visit:  Essential hypertension -     Basic metabolic panel -     lisinopril (ZESTRIL) 40 MG tablet; Take 1 tablet (40 mg total) by mouth daily for 30 days. Continue all antihypertensives as prescribed.  Remember to bring in your blood pressure log with you for your follow up appointment.     Elevated brain natriuretic peptide (BNP) level -     Brain natriuretic peptide -     furosemide (LASIX) 40 MG tablet; Take 1 tablet (40 mg total) by mouth daily for 30 days. -     lisinopril (ZESTRIL) 40 MG tablet; Take 1 tablet (40 mg total) by mouth daily for 30 days. -     metoprolol tartrate (LOPRESSOR) 25 MG tablet; Take 1 tablet (25 mg total) by mouth 2 (two) times daily for 30 days. DASH DIET BILATERAL COMPRESSION STOCKINGS   Patient has been counseled on age-appropriate routine health concerns for screening and prevention. These are reviewed and up-to-date. Referrals have been placed accordingly. Immunizations are up-to-date or declined.    Subjective:   Chief Complaint  Patient presents with  . Follow-up   HPI Frank Love 63 y.o. male presents to office today for BLE edema and right knee pain.   Cardiac Problem He has been experiencing increased swelling in the BLE into both thighs. He also has pain in the right knee with increased swelling (I believe this is more of a tendon/ligament concern). He was started on beta blocker (lopressor 50 mg BID), ACE (lisinopril 40 mg daily) and 20 mg of lasix a few weeks ago. Today he reports only minimal reduction of BLE swelling. Will increase lasix today and he has been instructed to decrease lopressor to 25 mg BID due to bradycardia.  He denies any chest pain or increased shortness of breath. EKG is abnormal but does not show any acute ischemia. Repeat BNP pending. He needs an ECHO and cardiology referral. He is uninsured. Patient has been advised  to apply for financial assistance and schedule to see our financial counselor.    06/13/18 1030  BNP 1,555.9*       Essential Hypertension Blood pressure is well controlled. Denies chest pain, shortness of breath, palpitations, lightheadedness, dizziness, headaches or BLE edema. We discussed dietary modifications today including sodium and fluid restriction.  BP Readings from Last 3 Encounters:  07/06/18 125/73  01/26/17 (!) 135/100  07/04/16 117/63    Review of Systems  Constitutional: Negative for fever, malaise/fatigue and weight loss.  HENT: Negative.  Negative for nosebleeds.   Eyes: Negative.  Negative for blurred vision, double vision and photophobia.  Respiratory: Negative.  Negative for cough and shortness of breath.   Cardiovascular: Positive for leg swelling. Negative for chest pain and palpitations.  Gastrointestinal: Negative.  Negative for heartburn, nausea and vomiting.  Musculoskeletal: Negative.  Negative for myalgias.  Neurological: Negative.  Negative for dizziness, focal weakness, seizures and headaches.  Psychiatric/Behavioral: Negative.  Negative for suicidal ideas.    Past Medical History:  Diagnosis Date  . Chronic pain of right knee   . Phimosis     Past Surgical History:  Procedure Laterality Date  . NO PAST SURGERIES      Family History  Problem Relation Age of Onset  . Diabetes Mother   . Heart disease Mother     Social History Reviewed with no changes to be made today.  Outpatient Medications Prior to Visit  Medication Sig Dispense Refill  . IBUPROFEN PO Take by mouth.    Marland Kitchen lisinopril (ZESTRIL) 40 MG tablet Take 1 tablet (40 mg total) by mouth daily for 30 days. 30 tablet 1  . metoprolol tartrate (LOPRESSOR) 50 MG tablet Take 1 tablet (50 mg total) by mouth 2 (two) times daily for 30 days. 60 tablet 1  . furosemide (LASIX) 20 MG tablet Take 1 tablet (20 mg total) by mouth daily for 14 days. 14 tablet 0   No facility-administered  medications prior to visit.     No Known Allergies     Objective:    BP 125/73 (BP Location: Right Arm, Patient Position: Sitting, Cuff Size: Large)   Pulse (!) 51   Temp 98.1 F (36.7 C) (Oral)   Resp 16   Wt 247 lb (112 kg)   SpO2 97%   BMI 31.71 kg/m  Wt Readings from Last 3 Encounters:  07/06/18 247 lb (112 kg)  06/12/18 254 lb (115.2 kg)  01/26/17 295 lb 12.8 oz (134.2 kg)    Physical Exam Cardiovascular:     Rate and Rhythm: Bradycardia present.  Musculoskeletal:        General: Swelling present.     Right knee: He exhibits swelling. No tenderness found.     Left knee: He exhibits swelling (R>L).     Right ankle: He exhibits swelling. No tenderness.     Left ankle: He exhibits swelling. No tenderness.     Right upper leg: He exhibits edema. He exhibits no tenderness.     Left upper leg: He exhibits edema. He exhibits no tenderness.     Right lower leg: He exhibits no tenderness. Edema (2+ pitting ) present.     Left lower leg: He exhibits no tenderness. Edema (2+ pitting ) present.     Right foot: Swelling present. No tenderness.     Left foot: Swelling present. No tenderness.        Patient has been counseled extensively about nutrition and exercise as well as the importance of adherence with medications and regular follow-up. The patient was given clear instructions to go to ER or return to medical center if symptoms don't improve, worsen or new problems develop. The patient verbalized understanding.   Follow-up: Return in about 2 weeks (around 07/20/2018) for f/u BLE edema.[name], FNP-BC [facility] and [facility] [place], [place]   07/08/2018, 7:35 PM

## 2018-07-06 NOTE — Patient Instructions (Signed)
Heart Failure  Heart failure means your heart has trouble pumping blood. This makes it hard for your body to work well. Heart failure is usually a long-term (chronic) condition. You must take good care of yourself and follow your treatment plan from your doctor. Follow these instructions at home: Medicines  Take over-the-counter and prescription medicines only as told by your doctor. ? Do not stop taking your medicine unless your doctor told you to do that. ? Do not skip any doses. ? Refill your prescriptions before you run out of medicine. You need your medicines every day. Eating and drinking   Eat heart-healthy foods. Talk with a diet and nutrition specialist (dietitian) to make an eating plan.  Choose foods that: ? Have no trans fat. ? Are low in saturated fat and cholesterol.  Choose healthy foods, like: ? Fresh or frozen fruits and vegetables. ? Fish. ? Low-fat (lean) meats. ? Legumes (like beans, peas, and lentils). ? Fat-free or low-fat dairy products. ? Whole-grain foods. ? High-fiber foods.  Limit salt (sodium) if told by your doctor. Ask your nutrition specialist to recommend heart-healthy seasonings.  Cook in healthy ways instead of frying. Healthy ways of cooking include: ? Roasting. ? Grilling. ? Broiling. ? Baking. ? Poaching. ? Steaming. ? Stir-frying.  Limit how much fluid you drink, if told by your doctor. Lifestyle  Do not smoke or use chewing tobacco. Do not use nicotine gum or patches before talking to your doctor.  Limit alcohol intake to no more than 1 drink a day for non-pregnant women and 2 drinks a day for men. One drink equals 12 oz of beer, 5 oz of wine, or 1 oz of hard liquor. ? Tell your doctor if you drink alcohol many times a week. ? Talk with your doctor about whether any alcohol is safe for you. ? You should stop drinking alcohol: ? If your heart has been damaged by alcohol. ? You have very bad heart failure.  Do not use illegal  drugs.  Lose weight if told by your doctor.  Do moderate physical activity if told by your doctor. Ask your doctor what activities are safe for you if: ? You are of older age (elderly). ? You have very bad heart failure. Keep track of important information  Weigh yourself every day. ? Weigh yourself every morning after you pee (urinate) and before breakfast. ? Wear the same amount of clothing each time. ? Write down your daily weight. Give your record to your doctor.  Check and write down your blood pressure as told by your doctor.  Check your pulse as told by your doctor. Dealing with heat and cold  If the weather is very hot: ? Avoid activity that takes a lot of energy. ? Use air conditioning or fans, or find a cooler place. ? Avoid caffeine. ? Avoid alcohol. ? Wear clothing that is loose-fitting, lightweight, and light-colored.  If the weather is very cold: ? Avoid activity that takes a lot of energy. ? Layer your clothes. ? Wear mittens or gloves, a hat, and a scarf when you go outside. ? Avoid alcohol. General instructions  Manage other conditions that you have as told by your doctor.  Learn to manage stress. If you need help, ask your doctor.  Plan rest periods for when you get tired.  Get education and support as needed.  Get rehab (rehabilitation) to help you stay independent and to help with everyday tasks.  Stay up to date with shots (  General instructions   Manage other conditions that you have as told by your doctor.   Learn to manage stress. If you need help, ask your doctor.   Plan rest periods for when you get tired.   Get education and support as needed.   Get rehab (rehabilitation) to help you stay independent and to help with everyday tasks.   Stay up to date with shots (immunizations), especially pneumococcal and flu (influenza) shots.   Keep all follow-up visits as told by your doctor. This is important.  Contact a doctor if:   You gain weight quickly.   You are more short of breath than normal.   You cannot do your normal activities.   You tire easily.   You cough more than normal, especially with activity.   You have any or more puffiness (swelling) in areas such as your hands, feet, ankles, or belly (abdomen).   You cannot sleep because it is hard to breathe.   You feel like your heart  is beating fast (palpitations).   You get dizzy or light-headed when you stand up.  Get help right away if:   You have trouble breathing.   You or someone else notices a change in your awareness. This could be trouble staying awake or trouble concentrating.   You have chest pain or discomfort.   You pass out (faint).  Summary   Heart failure means your heart has trouble pumping blood.   Make sure you refill your prescriptions before you run out of medicine. You need your medicines every day.   Keep records of your weight and blood pressure to give to your doctor.   Contact a doctor if you gain weight quickly.  This information is not intended to replace advice given to you by your health care provider. Make sure you discuss any questions you have with your health care provider.  Document Released: 10/20/2007 Document Revised: 10/04/2017 Document Reviewed: 02/02/2016  Elsevier Interactive Patient Education  2019 [employer]        Heart Failure Eating Plan  Heart failure, also called congestive heart failure, occurs when your heart does not pump blood well enough to meet your body's needs for oxygen-rich blood. Heart failure is a long-term (chronic) condition. Living with heart failure can be challenging. However, following your health care provider's instructions about a healthy lifestyle and working with a diet and nutrition specialist (dietitian) to choose the right foods may help to improve your symptoms.  What are tips for following this plan?  General guidelines   Do not eat more than 2,300 mg of salt (sodium) a day. The amount of sodium that is recommended for you may be lower, depending on your condition.   Maintain a healthy body weight as directed. Ask your health care provider what a healthy weight is for you.  ? Check your weight every day.  ? Work with your health care provider and dietitian to make a plan that is right for you to lose weight or maintain your current weight.   Limit how  much fluid you drink. Ask your health care provider or dietitian how much fluid you can have each day.   Limit or avoid alcohol as told by your health care provider or dietitian.  Reading food labels   Check food labels for the amount of sodium per serving. Choose foods that have less than 140 mg (milligrams) of sodium in each serving.   Check food labels for the number of calories   per serving. This is important if you need to limit your daily calorie intake to lose weight.   Check food labels for the serving size. If you eat more than one serving, you will be eating more sodium and calories than what is listed on the label.   Look for foods that are labeled as "sodium-free," "very low sodium," or "low sodium."  ? Foods labeled as "reduced sodium" or "lightly salted" may still have more sodium than what is recommended for you.  Cooking   Avoid adding salt when cooking. Ask your health care provider or dietitian before using salt substitutes.   Season food with salt-free seasonings, spices, or herbs. Check the label of seasoning mixes to make sure they do not contain salt.   Cook with heart-healthy oils, such as olive, canola, soybean, or sunflower oil.   Do not fry foods. Cook foods using low-fat methods, such as baking, boiling, grilling, and broiling.   Limit unhealthy fats when cooking by:  ? Removing the skin from poultry, such as chicken.  ? Removing all visible fats from meats.  ? Skimming the fat off from stews, soups, and gravies before serving them.  Meal planning     Limit your intake of:  ? Processed, canned, or pre-packaged foods.  ? Foods that are high in trans fat, such as fried foods.  ? Sweets, desserts, sugary drinks, and other foods with added sugar.  ? Full-fat dairy products, such as whole milk.   Eat a balanced diet that includes:  ? 4-5 servings of fruit each day and 4-5 servings of vegetables each day. At each meal, try to fill half of your plate with fruits and vegetables.  ? Up  to 6-8 servings of whole grains each day.  ? Up to 2 servings of lean meat, poultry, or fish each day. One serving of meat is equal to 3 oz. This is about the same size as a deck of cards.  ? 2 servings of low-fat dairy each day.  ? Heart-healthy fats. Healthy fats called omega-3 fatty acids are found in foods such as flaxseed and cold-water fish like sardines, salmon, and mackerel.   Aim to eat 25-35 g (grams) of fiber a day. Foods that are high in fiber include apples, broccoli, carrots, beans, peas, and whole grains.   Do not add salt or condiments that contain salt (such as soy sauce) to foods before eating.   When eating at a restaurant, ask that your food be prepared with less salt or no salt, if possible.   Try to eat 2 or more vegetarian meals each week.   Eat more home-cooked food and eat less restaurant, buffet, and fast food.  Recommended foods  The items listed may not be a complete list. Talk with your dietitian about what dietary choices are best for you.  Grains  Bread with less than 80 mg of sodium per slice. Whole-wheat pasta, quinoa, and brown rice. Oats and oatmeal. Barley. Millet. Grits and cream of wheat. Whole-grain and whole-wheat cold cereal.  Vegetables  All fresh vegetables. Vegetables that are frozen without sauce or added salt. Low-sodium or sodium-free canned vegetables.  Fruits  All fresh, frozen, and canned fruits. Dried fruits, such as raisins, prunes, and cranberries.  Meats and other protein foods  Lean cuts of meat. Skinless chicken and turkey. Fish with high omega-3 fatty acids, such as salmon, sardines, and other cold-water fishes. Eggs. Dried beans, peas, and edamame. Unsalted nuts and nut butters.    Dairy  Low-fat or nonfat (skim) milk and dried milk. Rice milk, soy milk, and almond milk. Low-fat or nonfat yogurt. Small amounts of reduced-sodium block cheese. Low-sodium cottage cheese.  Fats and oils  Olive, canola, soybean, flaxseed, or sunflower oil. Avocado.  Sweets and  desserts  Apple sauce. Granola bars. Sugar-free pudding and gelatin. Frozen fruit bars.  Seasoning and other foods  Fresh and dried herbs. Lemon or lime juice. Vinegar. Low-sodium ketchup. Salt-free marinades, salad dressings, sauces, and seasonings.  Foods to avoid  The items listed may not be a complete list. Talk with your dietitian about what dietary choices are best for you.  Grains  Bread with more than 80 mg of sodium per slice. Hot or cold cereal with more than 140 mg sodium per serving. Salted pretzels and crackers. Pre-packaged breadcrumbs. Bagels, croissants, and biscuits.  Vegetables  Canned vegetables. Frozen vegetables with sauce or seasonings. Creamed vegetables. French fries. Onion rings. Pickled vegetables and sauerkraut.  Fruits  Fruits that are dried with sodium-containing preservatives.  Meats and other protein foods  Ribs and chicken wings. Bacon, ham, pepperoni, bologna, salami, and packaged luncheon meats. Hot dogs, bratwurst, and sausage. Canned meat. Smoked meat and fish. Salted nuts and seeds.  Dairy  Whole milk, half-and-half, and cream. Buttermilk. Processed cheese, cheese spreads, and cheese curds. Regular cottage cheese. Feta cheese. Shredded cheese. String cheese.  Fats and oils  Butter, lard, shortening, ghee, and bacon fat. Canned and packaged gravies.  Seasoning and other foods  Onion salt, garlic salt, table salt, and sea salt. Marinades. Regular salad dressings. Relishes, pickles, and olives. Meat flavorings and tenderizers, and bouillon cubes. Horseradish, ketchup, and mustard. Worcestershire sauce. Teriyaki sauce, soy sauce (including reduced sodium). Hot sauce and Tabasco sauce. Steak sauce, fish sauce, oyster sauce, and cocktail sauce. Taco seasonings. Barbecue sauce. Tartar sauce.  Summary   A heart failure eating plan includes changes that limit your intake of sodium and unhealthy fat, and it may help you lose weight or maintain a healthy weight. Your health care provider  may also recommend limiting how much fluid you drink.   Most people with heart failure should eat no more than 2,300 mg of salt (sodium) a day. The amount of sodium that is recommended for you may be lower, depending on your condition.   Contact your health care provider or dietitian before making any major changes to your diet.  This information is not intended to replace advice given to you by your health care provider. Make sure you discuss any questions you have with your health care provider.  Document Released: 05/27/2016 Document Revised: 05/27/2016 Document Reviewed: 05/27/2016  Elsevier Interactive Patient Education  2019 [employer]

## 2018-07-06 NOTE — Progress Notes (Signed)
Follow up with right knee and results of XRay Has pain and swelling  BLE edema onset 4 months ago  Refill on Lasix- last dose was 3 -5 days ago

## 2018-07-07 LAB — BASIC METABOLIC PANEL
BUN/Creatinine Ratio: 24 (ref 10–24)
BUN: 27 mg/dL (ref 8–27)
CO2: 25 mmol/L (ref 20–29)
Calcium: 9.3 mg/dL (ref 8.6–10.2)
Chloride: 107 mmol/L — ABNORMAL HIGH (ref 96–106)
Creatinine, Ser: 1.13 mg/dL (ref 0.76–1.27)
GFR calc Af Amer: 80 mL/min/{1.73_m2} (ref >59)
GFR calc non Af Amer: 69 mL/min/{1.73_m2} (ref >59)
Glucose: 104 mg/dL — ABNORMAL HIGH (ref 65–99)
Potassium: 4 mmol/L (ref 3.5–5.2)
Sodium: 145 mmol/L — ABNORMAL HIGH (ref 134–144)

## 2018-07-07 LAB — BRAIN NATRIURETIC PEPTIDE: BNP: 1721.2 pg/mL — ABNORMAL HIGH (ref 0.0–100.0)

## 2018-07-08 ENCOUNTER — Encounter: Payer: Self-pay | Admitting: Nurse Practitioner

## 2018-07-09 ENCOUNTER — Emergency Department (HOSPITAL_COMMUNITY): Payer: Self-pay

## 2018-07-09 ENCOUNTER — Emergency Department (HOSPITAL_BASED_OUTPATIENT_CLINIC_OR_DEPARTMENT_OTHER): Payer: Self-pay

## 2018-07-09 ENCOUNTER — Telehealth: Payer: Self-pay

## 2018-07-09 ENCOUNTER — Emergency Department (HOSPITAL_COMMUNITY)
Admission: EM | Admit: 2018-07-09 | Discharge: 2018-07-09 | Disposition: A | Discharge status: Home / Self Care | Payer: Self-pay | Attending: Emergency Medicine | Admitting: Emergency Medicine

## 2018-07-09 DIAGNOSIS — R600 Localized edema: Secondary | ICD-10-CM | POA: Insufficient documentation

## 2018-07-09 DIAGNOSIS — Z79899 Other long term (current) drug therapy: Secondary | ICD-10-CM | POA: Insufficient documentation

## 2018-07-09 DIAGNOSIS — M7989 Other specified soft tissue disorders: Secondary | ICD-10-CM

## 2018-07-09 DIAGNOSIS — G8929 Other chronic pain: Secondary | ICD-10-CM | POA: Insufficient documentation

## 2018-07-09 DIAGNOSIS — M25561 Pain in right knee: Secondary | ICD-10-CM | POA: Insufficient documentation

## 2018-07-09 LAB — COMPREHENSIVE METABOLIC PANEL
ALT: 23 U/L (ref 0–44)
AST: 27 U/L (ref 15–41)
Albumin: 3.7 g/dL (ref 3.5–5.0)
Alkaline Phosphatase: 68 U/L (ref 38–126)
Anion gap: 11 (ref 5–15)
BUN: 21 mg/dL (ref 8–23)
CO2: 26 mmol/L (ref 22–32)
Calcium: 9.2 mg/dL (ref 8.9–10.3)
Chloride: 102 mmol/L (ref 98–111)
Creatinine, Ser: 1.28 mg/dL — ABNORMAL HIGH (ref 0.61–1.24)
GFR calc Af Amer: >60 mL/min (ref >60)
GFR calc non Af Amer: 59 mL/min — ABNORMAL LOW (ref >60)
Glucose, Bld: 145 mg/dL — ABNORMAL HIGH (ref 70–99)
Potassium: 3.5 mmol/L (ref 3.5–5.1)
Sodium: 139 mmol/L (ref 135–145)
Total Bilirubin: 2.4 mg/dL — ABNORMAL HIGH (ref 0.3–1.2)
Total Protein: 7.3 g/dL (ref 6.5–8.1)

## 2018-07-09 LAB — BRAIN NATRIURETIC PEPTIDE: B Natriuretic Peptide: 1077.6 pg/mL — ABNORMAL HIGH (ref 0.0–100.0)

## 2018-07-09 LAB — CBC WITH DIFFERENTIAL/PLATELET
Abs Immature Granulocytes: 0.03 10*3/uL (ref 0.00–0.07)
Basophils Absolute: 0.1 10*3/uL (ref 0.0–0.1)
Basophils Relative: 1 %
Eosinophils Absolute: 0.2 10*3/uL (ref 0.0–0.5)
Eosinophils Relative: 4 %
HCT: 45.3 % (ref 39.0–52.0)
Hemoglobin: 14.6 g/dL (ref 13.0–17.0)
Immature Granulocytes: 0 %
Lymphocytes Relative: 14 %
Lymphs Abs: 1 10*3/uL (ref 0.7–4.0)
MCH: 28.3 pg (ref 26.0–34.0)
MCHC: 32.2 g/dL (ref 30.0–36.0)
MCV: 88 fL (ref 80.0–100.0)
Monocytes Absolute: 0.4 10*3/uL (ref 0.1–1.0)
Monocytes Relative: 5 %
Neutro Abs: 5.3 10*3/uL (ref 1.7–7.7)
Neutrophils Relative %: 76 %
Platelets: 168 10*3/uL (ref 150–400)
RBC: 5.15 MIL/uL (ref 4.22–5.81)
RDW: 14.6 % (ref 11.5–15.5)
WBC: 7 10*3/uL (ref 4.0–10.5)
nRBC: 0 % (ref 0.0–0.2)

## 2018-07-09 LAB — TROPONIN I: Troponin I: <0.03 ng/mL (ref <0.03)

## 2018-07-09 NOTE — Discharge Instructions (Addendum)
Please continue taking your Lasix.  Please schedule a follow-up appointment with your doctor in the next 3 days for a recheck.  Your kidney number has been slightly elevating which can be related to the Lasix and it is important that you get this rechecked within the next 3 days.    Your BNP (heart number) is still high but improving since Friday.   If you develop shortness of breath, chest pain or have any worsening symptoms or concerns please seek additional medical care and evaluation.

## 2018-07-09 NOTE — ED Provider Notes (Signed)
[facility] EMERGENCY DEPARTMENT Provider Note   CSN: [id number] Arrival date & time: 07/09/18  1209    History   Chief Complaint Chief Complaint  Patient presents with  . excess fluid    HPI Frank Love is a 63 y.o. male with a past medical history of chronic right knee pain, who presents today for evaluation of leg swelling.  He was seen at [facility] for swelling.  His BNP had increased, and his Lasix dose was increased.  He has had 2 doses of that increased Lasix, and today he received a message reportedly telling him to come to the emergency room.  He denies any fevers.  He reports that his legs have both been swollen for many weeks however over the past few days the right thigh has become significantly more swollen.  He denies any cough or shortness of breath.  No fevers.  He has never seen a cardiologist before.  He denies any chest pain.     HPI  Past Medical History:  Diagnosis Date  . Chronic pain of right knee   . Phimosis     There are no active problems to display for this patient.   Past Surgical History:  Procedure Laterality Date  . NO PAST SURGERIES          Home Medications    Prior to Admission medications   Medication Sig Start Date End Date Taking? Authorizing Provider  furosemide (LASIX) 40 MG tablet Take 1 tablet (40 mg total) by mouth daily for 30 days. 07/06/18 08/05/18  [name], NP  IBUPROFEN PO Take by mouth.    [provider]  lisinopril (ZESTRIL) 40 MG tablet Take 1 tablet (40 mg total) by mouth daily for 30 days. 07/06/18 08/05/18  [name], NP  metoprolol tartrate (LOPRESSOR) 25 MG tablet Take 1 tablet (25 mg total) by mouth 2 (two) times daily for 30 days. 07/06/18 08/05/18  [name], NP    Family History Family History  Problem Relation Age of Onset  . Diabetes Mother   . Heart disease Mother     Social History Social History    Tobacco Use  . Smoking status: Never Smoker  . Smokeless tobacco: Never Used  Substance Use Topics  . Alcohol use: No    Alcohol/week: 0.0 standard drinks  . Drug use: No     Allergies   Patient has no known allergies.   Review of Systems Review of Systems  Constitutional: Negative for chills and fever.  Eyes: Negative for visual disturbance.  Respiratory: Negative for cough, chest tightness and shortness of breath.   Cardiovascular: Positive for leg swelling. Negative for chest pain and palpitations.  Genitourinary: Negative for dysuria.  Musculoskeletal: Negative for back pain.  Neurological: Negative for weakness and headaches.  All other systems reviewed and are negative.    Physical Exam Updated Vital Signs BP 140/88 (BP Location: Right Arm)   Pulse 71   Temp 98.2 F (36.8 C) (Oral)   Resp 18   SpO2 97%   Physical Exam Vitals signs and nursing note reviewed.  Constitutional:      General: He is not in acute distress.    Appearance: He is well-developed. He is not ill-appearing.  HENT:     Head: Normocephalic and atraumatic.  Eyes:     Conjunctiva/sclera: Conjunctivae normal.  Neck:     Musculoskeletal: Neck supple. No neck  rigidity.  Cardiovascular:     Rate and Rhythm: Normal rate and regular rhythm.     Heart sounds: Normal heart sounds. No murmur.  Pulmonary:     Effort: Pulmonary effort is normal. No respiratory distress.     Breath sounds: Normal breath sounds.  Abdominal:     General: There is no distension.     Palpations: Abdomen is soft.     Tenderness: There is no abdominal tenderness.  Musculoskeletal:     Right lower leg: 3+ Edema (RLE has induration on the posterior aspect up to mid thigh.  ) present.     Left lower leg: 2+ Edema present.  Skin:    General: Skin is warm and dry.  Neurological:     General: No focal deficit present.     Mental Status: He is alert. Mental status is at baseline.     Cranial Nerves: No cranial nerve  deficit.  Psychiatric:        Mood and Affect: Mood normal.        Behavior: Behavior normal.      ED Treatments / Results  Labs (all labs ordered are listed, but only abnormal results are displayed) Labs Reviewed  COMPREHENSIVE METABOLIC PANEL - Abnormal; Notable for the following components:      Result Value   Glucose, Bld 145 (*)    Creatinine, Ser 1.28 (*)    Total Bilirubin 2.4 (*)    GFR calc non Af Amer 59 (*)    All other components within normal limits  BRAIN NATRIURETIC PEPTIDE - Abnormal; Notable for the following components:   B Natriuretic Peptide 1,077.6 (*)    All other components within normal limits  CBC WITH DIFFERENTIAL/PLATELET  TROPONIN I    EKG EKG Interpretation  Date/Time:  Monday July 09 2018 12:35:40 EDT Ventricular Rate:  72 PR Interval:    QRS Duration: 156 QT Interval:  505 QTC Calculation: 553 R Axis:   -110 Text Interpretation:  Sinus rhythm Atrial premature complex Nonspecific IVCD with LAD Probable inferior infarct, age indeterminate Anterolateral infarct, age indeterminate Confirmed by Gerlene Fee ([id number]) on 07/09/2018 2:04:57 PM   Radiology Dg Chest 2 View  Result Date: 07/09/2018 CLINICAL DATA:  CHF.  Leg swelling EXAM: CHEST - 2 VIEW COMPARISON:  07/04/2016 FINDINGS: Cardiac enlargement. Vascularity normal. Negative for heart failure or edema. No infiltrate or effusion. IMPRESSION: Cardiac enlargement without acute abnormality. Electronically Signed   By: Franchot Gallo M.D.   On: 07/09/2018 13:08   Vas [place] Lower Extremity Venous (dvt) (only Mc & Wl 7a-7p)  Result Date: 07/09/2018  Lower Venous Study Indications: 3 months right thigh swelling.  Comparison Study: no prior Performing Technologist: June Leap RDMS, RVT  Examination Guidelines: A complete evaluation includes B-mode imaging, spectral Doppler, color Doppler, and power Doppler as needed of all accessible portions of each vessel. Bilateral testing is considered an integral  part of a complete examination. Limited examinations for reoccurring indications may be performed as noted.  +---------+---------------+---------+-----------+----------+-------+ RIGHT    CompressibilityPhasicitySpontaneityPropertiesSummary +---------+---------------+---------+-----------+----------+-------+ CFV      Full           Yes      Yes                          +---------+---------------+---------+-----------+----------+-------+ SFJ      Full                                                 +---------+---------------+---------+-----------+----------+-------+  FV Prox  Full                                                 +---------+---------------+---------+-----------+----------+-------+ FV Mid   Full                                                 +---------+---------------+---------+-----------+----------+-------+ FV DistalFull                                                 +---------+---------------+---------+-----------+----------+-------+ PFV      Full                                                 +---------+---------------+---------+-----------+----------+-------+ POP      Full           Yes      Yes                          +---------+---------------+---------+-----------+----------+-------+ PTV      Full                                                 +---------+---------------+---------+-----------+----------+-------+ PERO     Full                                                 +---------+---------------+---------+-----------+----------+-------+ GSV      Full                                                 +---------+---------------+---------+-----------+----------+-------+ SSV      Full                                                 +---------+---------------+---------+-----------+----------+-------+   +----+---------------+---------+-----------+----------+-------+  LEFTCompressibilityPhasicitySpontaneityPropertiesSummary +----+---------------+---------+-----------+----------+-------+ CFV Full           Yes      Yes                          +----+---------------+---------+-----------+----------+-------+     Summary: Right: There is no evidence of deep vein thrombosis in the lower extremity. No cystic structure found in the popliteal fossa. Left: No evidence of common femoral vein obstruction.  *See table(s) above for measurements and observations.    Preliminary     Procedures Procedures (including critical care time)  Medications Ordered in ED Medications -  No data to display   Initial Impression / Assessment and Plan / ED Course  I have reviewed the triage vital signs and the nursing notes.  Pertinent labs & imaging results that were available during my care of the patient were reviewed by me and considered in my medical decision making (see chart for details).  Clinical Course as of Jul 09 1419  Mon Jul 09, 2018  1316 Negative for DVT.     [EH]    Clinical Course User Index [EH] Lorin Glass, PA-C      Patient presents today for evaluation of bilateral leg edema right worse than left.  He was seen by his PCP on Friday and saw a inbox message today telling him to come to the emergency room.  Today his BNP is improving.  He has taken 2 doses of the increased 40 mg of Lasix since previous BNP.  His troponin is not elevated, his chest x-ray does not show any evidence of pulmonary edema.  Given the asymmetric nature of his leg swelling DVT ultrasound was obtained without evidence of DVT.  His creatinine since 06/13/2018 has increased from 1.07 up to 1.28.  This is a very slight increase, however he was recommended to follow-up with his PCP.      Given his improvement with the increased Lasix dose, without evidence of pulmonary edema, negative troponin, and generally reassuring blood work admission is not clearly indicated at this  time.  Plan to recommend outpatient close follow up in the next three days with PCP.  Given education on edema, DASH plan, and need to follow up with PCP.   This patient was seen as a shared visit with Dr. Sedonia Small.   Return precautions were discussed with patient who states their understanding.  At the time of discharge patient denied any unaddressed complaints or concerns.  Patient is agreeable for discharge home.   Final Clinical Impressions(s) / ED Diagnoses   Final diagnoses:  Bilateral leg edema    ED Discharge Orders    None       Lorin Glass, Hershal Coria 07/09/18 1427    Maudie Flakes, MD 07/10/18 [phone]

## 2018-07-09 NOTE — ED Notes (Addendum)
Pt taking lasix was increased to 40mg  Saturday. 2 days on this new med.

## 2018-07-09 NOTE — Telephone Encounter (Signed)
CMA spoke to patient to inform on lab results and to go the Emergency room to be evaluated.   Pt. Verified DOB. Pt. Stated he is currently at the ED.

## 2018-07-09 NOTE — ED Triage Notes (Signed)
Pt in with c/o R leg swelling, states hx of new CHF and swelling is primarily under R thigh and knee. Had xray recently, showed no fluid on knee, but PCP sent him to ED for further eval. Denies sob

## 2018-07-09 NOTE — ED Notes (Signed)
Patient verbalizes understanding of discharge instructions. Opportunity for questioning and answers were provided. Armband removed by staff, pt discharged from ED home via POV.  

## 2018-07-09 NOTE — Telephone Encounter (Signed)
-----   Message from [name], NP sent at 07/08/2018  6:40 PM EDT ----- Your BNP which is an indicator of heart failure has increased. I need for you to go the Emergency room and be evaluated and treated for fluid overload.

## 2018-07-09 NOTE — ED Notes (Signed)
Patient transported to X-ray 

## 2018-07-09 NOTE — Progress Notes (Signed)
RLE venous duplex       has been completed. Preliminary results can be found under CV proc through chart review. Audrielle Vankuren, BS, RDMS, RVT   

## 2018-07-10 ENCOUNTER — Telehealth: Payer: Self-pay | Admitting: Nurse Practitioner

## 2018-07-10 NOTE — Telephone Encounter (Signed)
CMA spoke to patient to inform that PCP want patient to keep his 26th appt.  Pt. Understood.

## 2018-07-10 NOTE — Telephone Encounter (Signed)
Patient called stating that he was told by the provider at the ED to be seen by PCP to check his kidney lasix within the next two days and would like to know if he should be seen within the next two days or if he should wait until the 26 . Please follow up.

## 2018-07-11 ENCOUNTER — Telehealth: Payer: Self-pay | Admitting: Nurse Practitioner

## 2018-07-11 NOTE — Telephone Encounter (Signed)
New Message  Pt dropped of parking placard form. It was place in [facility]

## 2018-07-12 ENCOUNTER — Encounter: Payer: Self-pay | Admitting: Nurse Practitioner

## 2018-07-12 NOTE — Telephone Encounter (Signed)
Will inform PCP and will notified patient when ready for pick up.

## 2018-07-18 NOTE — Telephone Encounter (Signed)
CMA attempt to reach patient to inform is parking placard is ready for him to pick up in the office. No answer.  Left a VM.

## 2018-07-20 ENCOUNTER — Encounter: Payer: Self-pay | Admitting: Nurse Practitioner

## 2018-07-20 ENCOUNTER — Other Ambulatory Visit: Payer: Self-pay

## 2018-07-20 ENCOUNTER — Ambulatory Visit: Payer: Self-pay | Attending: Nurse Practitioner | Admitting: Nurse Practitioner

## 2018-07-20 DIAGNOSIS — R600 Localized edema: Secondary | ICD-10-CM

## 2018-07-20 DIAGNOSIS — R7989 Other specified abnormal findings of blood chemistry: Secondary | ICD-10-CM

## 2018-07-20 NOTE — Progress Notes (Signed)
Virtual Visit via Telephone Note Due to national recommendations of social distancing due to [place] 19, telehealth visit is felt to be most appropriate for this patient at this time.  I discussed the limitations, risks, security and privacy concerns of performing an evaluation and management service by telephone and the availability of in person appointments. I also discussed with the patient that there may be a patient responsible charge related to this service. The patient expressed understanding and agreed to proceed.    I connected with Frank Love on 07/20/18  at   9:10 AM EDT  EDT by telephone and verified that I am speaking with the correct person using two identifiers.   Consent I discussed the limitations, risks, security and privacy concerns of performing an evaluation and management service by telephone and the availability of in person appointments. I also discussed with the patient that there may be a patient responsible charge related to this service. The patient expressed understanding and agreed to proceed.   Location of Patient: Private Residence   Location of Provider: [place] [facility] [place] participating in Telemedicine visit: Geryl Rankins FNP-BC [facility]    History of Present Illness: Telemedicine visit for:  HFU  Seen in the ED on 07-09-2018 for BLE swelling and increased BNP. He was noted for 3+ edema RLE and 2+ edema LLE. [place] negative for DVT.  Upon discharge BNP down from 1,721 to 1,077. Cxray negative for pulmonary edema. He was discharged home with instructions to decrease sodium in diet and edema education.  Recent Labs    06/13/18 1030 07/06/18 1017 07/09/18 1238  BNP 1,555.9* 1,721.2* 1,077.6*   Today he states the edema has improved however still present. He is taking lasix 40mg  daily as prescribed. Will need to be referred to Cardiology. He has been instructed on multiple occasions to apply for  the financial assistance program. We discussed wearing daily compression socks and DASH Diet today. He currently denies chest pain, shortness of breath, palpitations, lightheadedness, dizziness, headaches or orthopnea.  Continues to endorse right knee pain and swelling around the knee. Will need MRI as xray of knee negative and patient is having difficulty ambulating/weight bearing on right knee.   Past Medical History:  Diagnosis Date  . Chronic pain of right knee   . Phimosis     Past Surgical History:  Procedure Laterality Date  . NO PAST SURGERIES      Family History  Problem Relation Age of Onset  . Diabetes Mother   . Heart disease Mother     Social History   Socioeconomic History  . Marital status: Married    Spouse name: Not on file  . Number of children: Not on file  . Years of education: Not on file  . Highest education level: Not on file  Occupational History  . Not on file  Social Needs  . Financial resource strain: Not on file  . Food insecurity    Worry: Not on file    Inability: Not on file  . Transportation needs    Medical: Not on file    Non-medical: Not on file  Tobacco Use  . Smoking status: Never Smoker  . Smokeless tobacco: Never Used  Substance and Sexual Activity  . Alcohol use: No    Alcohol/week: 0.0 standard drinks  . Drug use: No  . Sexual activity: Not on file  Lifestyle  . Physical activity    Days  per week: Not on file    Minutes per session: Not on file  . Stress: Not on file  Relationships  . Social Herbalist on phone: Not on file    Gets together: Not on file    Attends religious service: Not on file    Active member of club or organization: Not on file    Attends meetings of clubs or organizations: Not on file    Relationship status: Not on file  Other Topics Concern  . Not on file  Social History Narrative  . Not on file     Observations/Objective: Awake, alert and oriented x 3   Review of Systems   Constitutional: Negative for fever, malaise/fatigue and weight loss.  HENT: Negative.  Negative for nosebleeds.   Eyes: Negative.  Negative for blurred vision, double vision and photophobia.  Respiratory: Negative.  Negative for cough and shortness of breath.   Cardiovascular: Positive for leg swelling. Negative for chest pain and palpitations.  Gastrointestinal: Negative.  Negative for heartburn, nausea and vomiting.  Musculoskeletal: Positive for joint pain (SEE HPI). Negative for myalgias.  Neurological: Negative.  Negative for dizziness, focal weakness, seizures and headaches.  Psychiatric/Behavioral: Negative.  Negative for suicidal ideas.    Assessment and Plan: [name] was evaluated today for follow-up.  Diagnoses and all orders for this visit:  Bilateral leg edema Continue lasix as prescribed  Elevated brain natriuretic peptide (BNP) level Continue lasix, metoprolol and lisinopril as prescribed.    Follow Up Instructions Return in about 4 weeks (around 08/17/2018) for edema.     I discussed the assessment and treatment plan with the patient. The patient was provided an opportunity to ask questions and all were answered. The patient agreed with the plan and demonstrated an understanding of the instructions.   The patient was advised to call back or seek an in-person evaluation if the symptoms worsen or if the condition fails to improve as anticipated.  I provided 21 minutes of non-face-to-face time during this encounter including median intraservice time, reviewing previous notes, labs, imaging, medications and explaining diagnosis and management.  Frank Pounds, FNP-BC

## 2018-07-23 ENCOUNTER — Encounter: Payer: Self-pay | Admitting: Nurse Practitioner

## 2018-07-31 ENCOUNTER — Telehealth: Payer: Self-pay

## 2018-07-31 ENCOUNTER — Other Ambulatory Visit: Payer: Self-pay

## 2018-07-31 ENCOUNTER — Ambulatory Visit: Payer: Self-pay | Attending: Family Medicine

## 2018-07-31 NOTE — Telephone Encounter (Signed)
Call placed to patient at request of Wynonia Hazard, [facility] Financial Counselor.  The patient stated that he had been working for [employer] but has not been able to work since COVID-19 pandemic. He said that he already receives $960/month from social security.  He has attempted to apply for medicaid but has not been able to get through. He also noted that he attempted to complete a medicaid application on-line.  This CM provided him with the phone number for [facility].   This CM explained to him that [employer] [facility] [place] may be able to assist him with navigating medicaid and/or other insurance options.  He provided authorization for a referral to be made to Legal Aid.  The referral was then faxed to attention of Abelino Derrick.

## 2018-08-01 ENCOUNTER — Other Ambulatory Visit: Payer: Self-pay | Admitting: Nurse Practitioner

## 2018-08-01 DIAGNOSIS — R7989 Other specified abnormal findings of blood chemistry: Secondary | ICD-10-CM

## 2018-08-03 ENCOUNTER — Other Ambulatory Visit: Payer: Self-pay

## 2018-08-03 ENCOUNTER — Ambulatory Visit: Payer: Self-pay | Attending: Family Medicine

## 2018-08-03 DIAGNOSIS — R7989 Other specified abnormal findings of blood chemistry: Secondary | ICD-10-CM

## 2018-08-04 LAB — CMP14+EGFR
ALT: 35 IU/L (ref 0–44)
AST: 36 IU/L (ref 0–40)
Albumin/Globulin Ratio: 1.4 (ref 1.2–2.2)
Albumin: 4.2 g/dL (ref 3.8–4.8)
Alkaline Phosphatase: 69 IU/L (ref 39–117)
BUN/Creatinine Ratio: 33 — ABNORMAL HIGH (ref 10–24)
BUN: 34 mg/dL — ABNORMAL HIGH (ref 8–27)
Bilirubin Total: 1.3 mg/dL — ABNORMAL HIGH (ref 0.0–1.2)
CO2: 22 mmol/L (ref 20–29)
Calcium: 9.4 mg/dL (ref 8.6–10.2)
Chloride: 106 mmol/L (ref 96–106)
Creatinine, Ser: 1.02 mg/dL (ref 0.76–1.27)
GFR calc Af Amer: 90 mL/min/{1.73_m2} (ref >59)
GFR calc non Af Amer: 78 mL/min/{1.73_m2} (ref >59)
Globulin, Total: 3 g/dL (ref 1.5–4.5)
Glucose: 145 mg/dL — ABNORMAL HIGH (ref 65–99)
Potassium: 3.6 mmol/L (ref 3.5–5.2)
Sodium: 145 mmol/L — ABNORMAL HIGH (ref 134–144)
Total Protein: 7.2 g/dL (ref 6.0–8.5)

## 2018-08-04 LAB — LIPID PANEL
Chol/HDL Ratio: 3.4 ratio (ref 0.0–5.0)
Cholesterol, Total: 182 mg/dL (ref 100–199)
HDL: 53 mg/dL (ref >39)
LDL Calculated: 116 mg/dL — ABNORMAL HIGH (ref 0–99)
Triglycerides: 64 mg/dL (ref 0–149)
VLDL Cholesterol Cal: 13 mg/dL (ref 5–40)

## 2018-08-04 LAB — BRAIN NATRIURETIC PEPTIDE: BNP: 316.2 pg/mL — ABNORMAL HIGH (ref 0.0–100.0)

## 2018-08-06 ENCOUNTER — Other Ambulatory Visit: Payer: Self-pay | Admitting: Nurse Practitioner

## 2018-08-06 DIAGNOSIS — R7989 Other specified abnormal findings of blood chemistry: Secondary | ICD-10-CM

## 2018-08-06 NOTE — Telephone Encounter (Signed)
Please fill if appropriate.  

## 2018-08-07 MED ORDER — FUROSEMIDE 40 MG PO TABS: 40.0000 mg | ORAL_TABLET | Freq: Every day | ORAL | 0 refills | Status: DC

## 2018-08-08 ENCOUNTER — Telehealth (INDEPENDENT_AMBULATORY_CARE_PROVIDER_SITE_OTHER): Payer: Self-pay

## 2018-08-08 NOTE — Telephone Encounter (Signed)
As per Frank Love, [employer] [facility] [place], they have still been trying to reach the patient.

## 2018-08-13 ENCOUNTER — Telehealth: Payer: Self-pay | Admitting: Nurse Practitioner

## 2018-08-13 NOTE — Telephone Encounter (Signed)
Patient called stating that he has received his OC and is wanting to see a cardiologist and orthopedic for his knees . Please follow up.

## 2018-08-16 ENCOUNTER — Other Ambulatory Visit: Payer: Self-pay | Admitting: Nurse Practitioner

## 2018-08-16 DIAGNOSIS — M25561 Pain in right knee: Secondary | ICD-10-CM

## 2018-08-16 DIAGNOSIS — R7989 Other specified abnormal findings of blood chemistry: Secondary | ICD-10-CM

## 2018-08-16 DIAGNOSIS — M25562 Pain in left knee: Secondary | ICD-10-CM

## 2018-08-16 NOTE — Telephone Encounter (Signed)
Referrals have been placed.  

## 2018-08-20 ENCOUNTER — Encounter: Payer: Self-pay | Admitting: Physician Assistant

## 2018-08-20 ENCOUNTER — Ambulatory Visit (INDEPENDENT_AMBULATORY_CARE_PROVIDER_SITE_OTHER): Payer: Self-pay

## 2018-08-20 ENCOUNTER — Ambulatory Visit (INDEPENDENT_AMBULATORY_CARE_PROVIDER_SITE_OTHER): Payer: Self-pay | Admitting: Physician Assistant

## 2018-08-20 DIAGNOSIS — M25561 Pain in right knee: Secondary | ICD-10-CM

## 2018-08-20 DIAGNOSIS — M1611 Unilateral primary osteoarthritis, right hip: Secondary | ICD-10-CM

## 2018-08-20 NOTE — Progress Notes (Signed)
Office Visit Note   Patient: Frank Love           Date of Birth: May 23, 1955           MRN: [id number] Visit Date: 08/20/2018              Requested by: [name], NP [place],  [place] [id number] PCP: [name], NP   Assessment & Plan: Visit Diagnoses:  1. Acute pain of right knee   2. Primary osteoarthritis of right hip     Plan: Explained to the patient that he has right hip arthritis that is end-stage.  This pain could definitely account for his knee pain.  Questions about his right hip for encouraged and answered.  Recommendation intra-articular injection of the hip which could give him some short-term relief versus right total hip arthroplasty.  Handout on anterior hip arthroplasty was given to him.  Questions about surgery and postoperative protocol were discussed with the patient.  Risk which include but are not limited to nerve or vessel injury, PE/DVT, wound healing problems, blood loss and worsening pain all discussed with patient at length.  If he does he is decided to proceed with surgery he would need cardiac clearance.  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  Therefore if he does wish to proceed with surgery would like for him to be seen by urology prior to surgery so they can make recommendations on catheterization at the time of surgery.  Patient will follow with [place] on as-needed basis.  He like to speak with his wife about what type of treatment he like to pursue.  Follow-Up Instructions: Return if symptoms worsen or fail to improve.   Orders:  Orders Placed This Encounter  Procedures  . XR HIP UNILAT W OR W/O PELVIS 2-3 VIEWS RIGHT   No orders of the defined types were placed in this encounter.     Procedures: No procedures performed   Clinical Data: No additional findings.   Subjective: Chief Complaint  Patient presents with   . Right Knee - Pain    HPI Frank Love is a 63 year old male comes in today with complaint of right knee pain.  He states that his right knee pain is been ongoing for several months.  He has had no known injury.  Did go to the ER where radiographs were obtained of his right knee and these show a well preserved right knee without any acute fractures or significant arthritic changes.  He is using a cane to ambulate.  Pain is worse with walking and getting out of the chair.  He walks with the aid of a cane.  He denies any injury to his knee.  He does have occasional right hip pain.  Denies any numbness tingling down the right leg.  Patient reports that he is recently been diagnosed with congestive heart failure.  Review of Systems Negative for fevers or chills please see HPI otherwise noncontributory  Objective: Vital Signs: There were no vitals taken for this visit.  Physical Exam Constitutional:      Appearance: He is not ill-appearing or diaphoretic.  Pulmonary:     Effort: Pulmonary effort is normal.  Neurological:     Mental Status: He is alert and oriented to person, place, and time.  Psychiatric:        Mood and Affect: Mood normal.  Ortho Exam Physical exam bilateral knees excellent range of motion.  No instability valgus varus stressing of either knee.  No tenderness medial lateral joint line of either knee.  No effusion abnormal warmth of either knee.  Left hip good range of motion without pain.  Right hip no internal or external rotation with significant pain with attempts of range of motion. Specialty Comments:  No specialty comments available.  Imaging: Xr Hip Unilat W Or W/o Pelvis 2-3 Views Right  Result Date: 08/20/2018 AP pelvis lateral view right hip: No acute fracture.  End-stage arthritis right hip.  Left hip well-preserved.  Both hips well located.    PMFS History: There are no active problems to display for this patient.  Past Medical History:   Diagnosis Date  . Chronic pain of right knee   . Phimosis     Family History  Problem Relation Age of Onset  . Diabetes Mother   . Heart disease Mother     Past Surgical History:  Procedure Laterality Date  . NO PAST SURGERIES     Social History   Occupational History  . Not on file  Tobacco Use  . Smoking status: Never Smoker  . Smokeless tobacco: Never Used  Substance and Sexual Activity  . Alcohol use: No    Alcohol/week: 0.0 standard drinks  . Drug use: No  . Sexual activity: Not on file

## 2018-08-21 ENCOUNTER — Encounter: Payer: Self-pay | Admitting: Nurse Practitioner

## 2018-08-21 ENCOUNTER — Telehealth: Payer: Self-pay | Admitting: Physician Assistant

## 2018-08-21 NOTE — Telephone Encounter (Signed)
Patient called asked what type of injection was Artis Delay referring to for him to get?  Patient ask if he can get the injection the same day of his appointment? The number to contact patient is [phone]

## 2018-08-22 ENCOUNTER — Other Ambulatory Visit: Payer: Self-pay

## 2018-08-22 DIAGNOSIS — M25551 Pain in right hip: Secondary | ICD-10-CM

## 2018-08-22 NOTE — Telephone Encounter (Signed)
Patient aware this is injection with Ernestina Patches

## 2018-08-29 ENCOUNTER — Telehealth: Payer: Self-pay

## 2018-08-29 NOTE — Telephone Encounter (Signed)
As per Frank Love, [employer] [facility] [place], they have contacted the patient and Frank Love will follow up with him regarding his medicaid questions.

## 2018-09-03 ENCOUNTER — Encounter: Payer: Self-pay | Admitting: Nurse Practitioner

## 2018-09-05 ENCOUNTER — Other Ambulatory Visit: Payer: Self-pay

## 2018-09-05 ENCOUNTER — Encounter: Payer: Self-pay | Admitting: Physical Medicine and Rehabilitation

## 2018-09-05 ENCOUNTER — Ambulatory Visit (INDEPENDENT_AMBULATORY_CARE_PROVIDER_SITE_OTHER): Payer: Self-pay | Admitting: Physical Medicine and Rehabilitation

## 2018-09-05 ENCOUNTER — Ambulatory Visit: Payer: Self-pay

## 2018-09-05 DIAGNOSIS — M25551 Pain in right hip: Secondary | ICD-10-CM

## 2018-09-05 NOTE — Progress Notes (Signed)
.  Numeric Pain Rating Scale and Functional Assessment Average Pain 8   In the last MONTH (on 0-10 scale) has pain interfered with the following?  1. General activity like being  able to carry out your everyday physical activities such as walking, climbing stairs, carrying groceries, or moving a chair?  Rating(8)    -Dye Allergies.  

## 2018-09-10 ENCOUNTER — Other Ambulatory Visit: Payer: Self-pay | Admitting: Nurse Practitioner

## 2018-09-10 DIAGNOSIS — Z412 Encounter for routine and ritual male circumcision: Secondary | ICD-10-CM

## 2018-09-11 ENCOUNTER — Encounter: Payer: Self-pay | Admitting: Nurse Practitioner

## 2018-09-11 ENCOUNTER — Other Ambulatory Visit: Payer: Self-pay | Admitting: Nurse Practitioner

## 2018-09-11 DIAGNOSIS — R7989 Other specified abnormal findings of blood chemistry: Secondary | ICD-10-CM

## 2018-09-11 DIAGNOSIS — I10 Essential (primary) hypertension: Secondary | ICD-10-CM

## 2018-09-11 MED ORDER — LISINOPRIL 40 MG PO TABS: 40.0000 mg | ORAL_TABLET | Freq: Every day | ORAL | 1 refills | Status: DC

## 2018-09-11 MED ORDER — METOPROLOL TARTRATE 25 MG PO TABS: 25.0000 mg | ORAL_TABLET | Freq: Two times a day (BID) | ORAL | 2 refills | Status: DC

## 2018-09-11 MED ORDER — FUROSEMIDE 40 MG PO TABS: 40.0000 mg | ORAL_TABLET | Freq: Every day | ORAL | 0 refills | Status: DC

## 2018-09-13 ENCOUNTER — Encounter: Payer: Self-pay | Admitting: Nurse Practitioner

## 2018-09-18 ENCOUNTER — Ambulatory Visit (INDEPENDENT_AMBULATORY_CARE_PROVIDER_SITE_OTHER): Payer: Self-pay | Admitting: Urology

## 2018-09-18 DIAGNOSIS — N471 Phimosis: Secondary | ICD-10-CM

## 2018-09-20 ENCOUNTER — Ambulatory Visit: Payer: Self-pay | Admitting: Cardiology

## 2018-09-20 NOTE — Progress Notes (Signed)
Cardiology Office Note   Date:  09/23/2018   ID:  GREYSUN BENSTON, DOB Jun 21, 1955, MRN [id number]  PCP:  [name], NP  Cardiologist:   No primary care provider on file. Referring:  [name], NP  Chief Complaint  Patient presents with  . Edema      History of Present Illness: Frank Love is a 63 y.o. male who is referred by [name], NP for evaluation of lower extremity edema and an elevated BNP.  The patient has had progressive lower extremity swelling left greater than right.  However, despite this finding he has not had any symptoms consistent with left-sided heart failure by his report.  He denies any shortness of breath, PND or orthopnea.  He denies any chest pressure, neck or arm discomfort.  However, he had a BNP level which is greater than 1500.  He was sent to the emergency room in June and I did review these records.  There was no cardiomegaly on chest x-ray with no evidence of edema.  There were no other acute findings.  I do not see his blood pressures been reported as particularly high.  However, he was started on ACE inhibitor and metoprolol.  He had improvement in his swelling with Lasix.  He does have an abnormal chest x-ray with inferior and anterior Q waves but this dates back a few years.  He has never had prior cardiac work-up.  Of note he does have limitations because of back pain.  He also has hip discomfort and knee pain.  He is apparently going to need hip surgery.  However, prior to this he needs circumcision as a would be difficulty apparently inserting the Foley.  He says that despite his age he still able to be active works part-time.  Past Medical History:  Diagnosis Date  . Chronic pain of right knee   . Phimosis     Past Surgical History:  Procedure Laterality Date  . NO PAST SURGERIES       Current Outpatient Medications  Medication Sig Dispense Refill  . furosemide (LASIX) 40 MG tablet Take 1 tablet (40 mg total)  by mouth daily. 30 tablet 0  . IBUPROFEN PO Take by mouth.    Marland Kitchen lisinopril (ZESTRIL) 40 MG tablet Take 1 tablet (40 mg total) by mouth daily. 30 tablet 1  . metoprolol tartrate (LOPRESSOR) 25 MG tablet Take 1 tablet (25 mg total) by mouth 2 (two) times daily. 60 tablet 2   No current facility-administered medications for this visit.     Allergies:   Patient has no known allergies.    Social History:  The patient  reports that he has never smoked. He has never used smokeless tobacco. He reports that he does not drink alcohol or use drugs.   Family History:  The patient's family history includes Diabetes in his mother; Heart disease in his mother.  He does not know his father's history.  ROS:  Please see the history of present illness.   Otherwise, review of systems are positive for none.   All other systems are reviewed and negative.    PHYSICAL EXAM: VS:  BP 128/72   Pulse (!) 58   Temp 99.2 F (37.3 C)   Ht 6\' 2"  (1.88 m)   Wt 220 lb (99.8 kg)   BMI 28.25 kg/m  , BMI Body mass index is 28.25 kg/m. GENERAL: He looks older than stated age. HEENT:  Pupils equal round  and reactive, fundi not visualized, oral mucosa unremarkable NECK:  No jugular venous distention, waveform within normal limits, carotid upstroke brisk and symmetric, no bruits, no thyromegaly LYMPHATICS:  No cervical, inguinal adenopathy LUNGS:  Clear to auscultation bilaterally BACK:  No CVA tenderness CHEST:  Unremarkable HEART:  PMI not displaced or sustained,S1 and S2 within normal limits, no S3, no S4, no clicks, no rubs, no murmurs ABD:  Flat, positive bowel sounds normal in frequency in pitch, no bruits, no rebound, no guarding, no midline pulsatile mass, no hepatomegaly, no splenomegaly EXT:  2 plus pulses throughout, trace edema, no cyanosis no clubbing SKIN:  No rashes no nodules NEURO:  Cranial nerves II through XII grossly intact, motor grossly intact throughout PSYCH:  Cognitively intact, oriented to  person place and time    EKG:  EKG is ordered today. The ekg ordered today demonstrates sinus rhythm, rate 58 left axis deviation, old inferior infarct, poor anterior R wave progression, old anterolateral infarct.   Recent Labs: 07/09/2018: Hemoglobin 14.6; Platelets 168 08/03/2018: ALT 35; BNP 316.2; BUN 34; Creatinine, Ser 1.02; Potassium 3.6; Sodium 145    Lipid Panel    Component Value Date/Time   CHOL 182 08/03/2018 1024   TRIG 64 08/03/2018 1024   HDL 53 08/03/2018 1024   CHOLHDL 3.4 08/03/2018 1024   LDLCALC 116 (H) 08/03/2018 1024      Wt Readings from Last 3 Encounters:  09/21/18 220 lb (99.8 kg)  07/06/18 247 lb (112 kg)  06/12/18 254 lb (115.2 kg)      Other studies Reviewed: Additional studies/ records that were reviewed today include: ED records. Review of the above records demonstrates:  Please see elsewhere in the note.     ASSESSMENT AND PLAN:  EDEMA: The patient has markedly elevated BNP that came down with diuresis.  He had cardiomegaly on CXR. He has a markedly abnormal EKG as well.  I would suspect heart failure and will start with an echocardiogram.  I will have a low threshold for ischemia work-up as well.  For now he will continue the meds as listed.  ABNORMAL EKG:  As above.  H  PREOP: The patient will need preop and cleared to have circumcision as this would be a very low risk procedure.  However, I would certainly want to see the results of the echo and consider further work-up prior to approving him for any hip surgery.  He had a discussion about this today.   Current medicines are reviewed at length with the patient today.  The patient does not have concerns regarding medicines.  The following changes have been made:  no change  Labs/ tests ordered today include:   Orders Placed This Encounter  Procedures  . ECHOCARDIOGRAM COMPLETE     Disposition:   FU with me after the echo.     Signed, [name], MD  09/23/2018 11:56 AM     [place]

## 2018-09-21 ENCOUNTER — Other Ambulatory Visit: Payer: Self-pay

## 2018-09-21 ENCOUNTER — Encounter: Payer: Self-pay | Admitting: Cardiology

## 2018-09-21 ENCOUNTER — Telehealth: Payer: Self-pay | Admitting: *Deleted

## 2018-09-21 ENCOUNTER — Ambulatory Visit (INDEPENDENT_AMBULATORY_CARE_PROVIDER_SITE_OTHER): Payer: Self-pay | Admitting: Cardiology

## 2018-09-21 VITALS — BP 128/72 | HR 58 | Temp 99.2°F | Ht 74.0 in | Wt 220.0 lb

## 2018-09-21 DIAGNOSIS — M7989 Other specified soft tissue disorders: Secondary | ICD-10-CM

## 2018-09-21 DIAGNOSIS — R9431 Abnormal electrocardiogram [ECG] [EKG]: Secondary | ICD-10-CM

## 2018-09-21 DIAGNOSIS — I517 Cardiomegaly: Secondary | ICD-10-CM

## 2018-09-21 NOTE — Patient Instructions (Signed)
Medication Instructions:  Your physician recommends that you continue on your current medications as directed. Please refer to the Current Medication list given to you today.  If you need a refill on your cardiac medications before your next appointment, please call your pharmacy.   Lab work: NONE   Testing/Procedures: Your physician has requested that you have an echocardiogram. Echocardiography is a painless test that uses sound waves to create images of your heart. It provides your doctor with information about the size and shape of your heart and how well your heart's chambers and valves are working. This procedure takes approximately one hour. There are no restrictions for this procedure. [facility] [place] [address]  Follow-Up: AFTER ECHO   Any Other Special Instructions Will Be Listed Below (If Applicable).   Echocardiogram An echocardiogram is a procedure that uses painless sound waves (ultrasound) to produce an image of the heart. Images from an echocardiogram can provide important information about:  Signs of coronary artery disease (CAD).  Aneurysm detection. An aneurysm is a weak or damaged part of an artery wall that bulges out from the normal force of blood pumping through the body.  Heart size and shape. Changes in the size or shape of the heart can be associated with certain conditions, including heart failure, aneurysm, and CAD.  Heart muscle function.  Heart valve function.  Signs of a past heart attack.  Fluid buildup around the heart.  Thickening of the heart muscle.  A tumor or infectious growth around the heart valves. Tell a health care provider about:  Any allergies you have.  All medicines you are taking, including vitamins, herbs, eye drops, creams, and over-the-counter medicines.  Any blood disorders you have.  Any surgeries you have had.  Any medical conditions you have.  Whether you are pregnant or may be pregnant. What are  the risks? Generally, this is a safe procedure. However, problems may occur, including:  Allergic reaction to dye (contrast) that may be used during the procedure. What happens before the procedure? No specific preparation is needed. You may eat and drink normally. What happens during the procedure?   An IV tube may be inserted into one of your veins.  You may receive contrast through this tube. A contrast is an injection that improves the quality of the pictures from your heart.  A gel will be applied to your chest.  A wand-like tool (transducer) will be moved over your chest. The gel will help to transmit the sound waves from the transducer.  The sound waves will harmlessly bounce off of your heart to allow the heart images to be captured in real-time motion. The images will be recorded on a computer. The procedure may vary among health care providers and hospitals. What happens after the procedure?  You may return to your normal, everyday life, including diet, activities, and medicines, unless your health care provider tells you not to do that. Summary  An echocardiogram is a procedure that uses painless sound waves (ultrasound) to produce an image of the heart.  Images from an echocardiogram can provide important information about the size and shape of your heart, heart muscle function, heart valve function, and fluid buildup around your heart.  You do not need to do anything to prepare before this procedure. You may eat and drink normally.  After the echocardiogram is completed, you may return to your normal, everyday life, unless your health care provider tells you not to do that.  This information is not intended to replace advice given to you by your health care provider. Make sure you discuss any questions you have with your health care provider. Document Released: 01/08/2000 Document Revised: 05/03/2018 Document Reviewed: 02/13/2016 Elsevier Patient Education  2020 [employer].

## 2018-09-23 ENCOUNTER — Encounter: Payer: Self-pay | Admitting: Cardiology

## 2018-09-24 ENCOUNTER — Encounter: Payer: Self-pay | Admitting: Physical Medicine and Rehabilitation

## 2018-09-27 ENCOUNTER — Other Ambulatory Visit: Payer: Self-pay | Admitting: Urology

## 2018-09-28 ENCOUNTER — Telehealth: Payer: Self-pay | Admitting: Cardiology

## 2018-09-28 NOTE — Telephone Encounter (Signed)
° °  [facility] Pre-operative Risk Assessment    Request for surgical clearance:  1. What type of surgery is being performed? circumcision   2. When is this surgery scheduled? 10/12/18  3. What type of clearance is required (medical clearance vs. Pharmacy clearance to hold med vs. Both)? Medical   4. Are there any medications that need to be held prior to surgery and how long? no  5. Practice name and name of physician performing surgery? Dr. Franchot Gallo, [facility]  6. What is your office phone number: [phone] x [phone]   7.   What is your office fax number: [phone]  8.   Anesthesia type (None, local, MAC, general) ? Local, possibly general TBD   Frank Love 09/28/2018, 8:48 AM  _________________________________________________________________   (provider comments below)

## 2018-10-02 ENCOUNTER — Ambulatory Visit (HOSPITAL_COMMUNITY): Payer: Self-pay | Attending: Cardiovascular Disease

## 2018-10-02 ENCOUNTER — Other Ambulatory Visit: Payer: Self-pay

## 2018-10-02 DIAGNOSIS — R9431 Abnormal electrocardiogram [ECG] [EKG]: Secondary | ICD-10-CM

## 2018-10-02 DIAGNOSIS — I517 Cardiomegaly: Secondary | ICD-10-CM

## 2018-10-02 NOTE — Telephone Encounter (Signed)
Dr. Rosezella Florida recent note mentioned cleared for circumcision since it is a very low risk procedure. However today's echo showed moderate LV dysfunction. Dr. Percival Spanish, would you prefer to delay the circumcision?

## 2018-10-03 NOTE — Telephone Encounter (Signed)
He is cleared to have the circumcision but he cannot have a hip surgery before I see him for further work up.

## 2018-10-03 NOTE — Telephone Encounter (Signed)
Left message to call back  

## 2018-10-04 NOTE — Telephone Encounter (Signed)
Patient returned call

## 2018-10-04 NOTE — Telephone Encounter (Signed)
Advised patient and sent clearance to [facility]  Patient would like to proceed with this procedure and just keep follow up next month as scheduled

## 2018-10-05 NOTE — Addendum Note (Signed)
Addended by: Therisa Doyne on: 10/05/2018 11:32 AM   Modules accepted: Orders

## 2018-10-09 NOTE — Patient Instructions (Signed)
Frank Love  10/09/2018     @PREFPERIOPPHARMACY @   Your procedure is scheduled on  10/12/2018 .  Report to [facility] at  1100  A.M.  Call this number if you have problems the morning of surgery:  [phone]   Remember:  Do not eat or drink after midnight.                        Take these medicines the morning of surgery with A SIP OF WATER  metoprolol    Do not wear jewelry, make-up or nail polish.  Do not wear lotions, powders, or perfumes. Please wear deodorant and brush your teeth.  Do not shave 48 hours prior to surgery.  Men may shave face and neck.  Do not bring valuables to the hospital.  [facility] is not responsible for any belongings or valuables.  Contacts, dentures or bridgework may not be worn into surgery.  Leave your suitcase in the car.  After surgery it may be brought to your room.  For patients admitted to the hospital, discharge time will be determined by your treatment team.  Patients discharged the day of surgery will not be allowed to drive home.   Name and phone number of your driver:   family Special instructions:  None  Please read over the following fact sheets that you were given. Anesthesia Post-op Instructions and Care and Recovery After Surgery       Circumcision, Adult, Care After This sheet gives you information about how to care for yourself after your procedure. Your doctor may also give you more specific instructions. If you have problems or questions, contact your doctor. Follow these instructions at home: Cut care   Follow instructions from your doctor about how to take care of your cut from surgery (incision). Make sure you: ? Wash your hands with soap and water before you change your bandage (dressing). If you cannot use soap and water, use hand sanitizer. ? Change your bandage as told by your doctor. ? Leave stitches (sutures), skin glue, or skin tape (adhesive) strips in place. They may need to stay in place  for 2 weeks or longer. If tape strips get loose and curl up, you may trim the loose edges. Do not remove tape strips completely unless your doctor says it is okay  Check your cut area every day for signs of infection. Check for: ? More redness, swelling, or pain. ? More fluid or blood. ? Warmth. ? Pus or a bad smell. Bathing  Do not take baths, swim, or use a hot tub until your doctor says it is okay. Ask your doctor if you can take showers. You may only be allowed to take sponge baths for bathing.  Do not get your cut area wet during the 24 hours after the procedure, or as long as told by your doctor.  When you can shower, do not rub the cut area. Gently pat it dry. General instructions  Avoid heavy lifting, contact sports, biking, or swimming until your doctor says it is okay.  Do not have sex until your doctor says it is okay.  Take or apply over-the-counter and prescription medicines only as told by your doctor.  Keep all follow-up visits as told by your doctor. This is important. Contact a doctor if:  Medicine does not help your pain.  You have more redness, swelling, or pain around your cut.  You have  more fluid or blood coming from your cut.  Your cut feels warm to the touch.  You have pus or a bad smell coming from your cut.  You have a fever. Get help right away if:  You cannot pee (urinate).  It hurts to pee.  Your pain is not helped by medicines.  There is redness, swelling, and soreness that spreads up the shaft of your penis, your thighs, or your lower belly (abdomen).  You have bleeding that does not stop when you press on it. Summary  Follow instructions from your doctor about how to take care of your cut from surgery (incision).  Check your cut area every day for signs of infection.  Do not have sex until your doctor says it is okay. This information is not intended to replace advice given to you by your health care provider. Make sure you discuss  any questions you have with your health care provider. Document Released: 06/29/2007 Document Revised: 12/23/2016 Document Reviewed: 01/19/2016 Elsevier Patient Education  2020 [employer] Anesthesia, Adult, Care After This sheet gives you information about how to care for yourself after your procedure. Your health care provider may also give you more specific instructions. If you have problems or questions, contact your health care provider. What can I expect after the procedure? After the procedure, the following side effects are common:  Pain or discomfort at the IV site.  Nausea.  Vomiting.  Sore throat.  Trouble concentrating.  Feeling cold or chills.  Weak or tired.  Sleepiness and fatigue.  Soreness and body aches. These side effects can affect parts of the body that were not involved in surgery. Follow these instructions at home:  For at least 24 hours after the procedure:  Have a responsible adult stay with you. It is important to have someone help care for you until you are awake and alert.  Rest as needed.  Do not: ? Participate in activities in which you could fall or become injured. ? Drive. ? Use heavy machinery. ? Drink alcohol. ? Take sleeping pills or medicines that cause drowsiness. ? Make important decisions or sign legal documents. ? Take care of children on your own. Eating and drinking  Follow any instructions from your health care provider about eating or drinking restrictions.  When you feel hungry, start by eating small amounts of foods that are soft and easy to digest (bland), such as toast. Gradually return to your regular diet.  Drink enough fluid to keep your urine pale yellow.  If you vomit, rehydrate by drinking water, juice, or clear broth. General instructions  If you have sleep apnea, surgery and certain medicines can increase your risk for breathing problems. Follow instructions from your health care provider about  wearing your sleep device: ? Anytime you are sleeping, including during daytime naps. ? While taking prescription pain medicines, sleeping medicines, or medicines that make you drowsy.  Return to your normal activities as told by your health care provider. Ask your health care provider what activities are safe for you.  Take over-the-counter and prescription medicines only as told by your health care provider.  If you smoke, do not smoke without supervision.  Keep all follow-up visits as told by your health care provider. This is important. Contact a health care provider if:  You have nausea or vomiting that does not get better with medicine.  You cannot eat or drink without vomiting.  You have pain that does not get better with medicine.  You are unable to pass urine.  You develop a skin rash.  You have a fever.  You have redness around your IV site that gets worse. Get help right away if:  You have difficulty breathing.  You have chest pain.  You have blood in your urine or stool, or you vomit blood. Summary  After the procedure, it is common to have a sore throat or nausea. It is also common to feel tired.  Have a responsible adult stay with you for the first 24 hours after general anesthesia. It is important to have someone help care for you until you are awake and alert.  When you feel hungry, start by eating small amounts of foods that are soft and easy to digest (bland), such as toast. Gradually return to your regular diet.  Drink enough fluid to keep your urine pale yellow.  Return to your normal activities as told by your health care provider. Ask your health care provider what activities are safe for you. This information is not intended to replace advice given to you by your health care provider. Make sure you discuss any questions you have with your health care provider. Document Released: 04/18/2000 Document Revised: 01/13/2017 Document Reviewed: 08/26/2016  Elsevier Patient Education  2020 [employer].

## 2018-10-10 ENCOUNTER — Encounter (HOSPITAL_COMMUNITY)
Admission: RE | Admit: 2018-10-10 | Discharge: 2018-10-10 | Disposition: A | Discharge status: Home / Self Care | Payer: Self-pay | Source: Ambulatory Visit | Attending: Urology | Admitting: Urology

## 2018-10-10 ENCOUNTER — Encounter (HOSPITAL_COMMUNITY): Payer: Self-pay

## 2018-10-10 ENCOUNTER — Other Ambulatory Visit: Payer: Self-pay

## 2018-10-10 ENCOUNTER — Other Ambulatory Visit (HOSPITAL_COMMUNITY)
Admission: RE | Admit: 2018-10-10 | Discharge: 2018-10-10 | Disposition: A | Discharge status: Home / Self Care | Payer: Self-pay | Source: Ambulatory Visit | Attending: Urology | Admitting: Urology

## 2018-10-10 ENCOUNTER — Telehealth: Payer: Self-pay

## 2018-10-10 DIAGNOSIS — Z20828 Contact with and (suspected) exposure to other viral communicable diseases: Secondary | ICD-10-CM | POA: Insufficient documentation

## 2018-10-10 DIAGNOSIS — N471 Phimosis: Secondary | ICD-10-CM | POA: Insufficient documentation

## 2018-10-10 DIAGNOSIS — Z01812 Encounter for preprocedural laboratory examination: Secondary | ICD-10-CM | POA: Insufficient documentation

## 2018-10-10 DIAGNOSIS — Z01818 Encounter for other preprocedural examination: Secondary | ICD-10-CM | POA: Insufficient documentation

## 2018-10-10 HISTORY — DX: Essential (primary) hypertension: I10

## 2018-10-10 LAB — BASIC METABOLIC PANEL
Anion gap: 11 (ref 5–15)
BUN: 27 mg/dL — ABNORMAL HIGH (ref 8–23)
CO2: 23 mmol/L (ref 22–32)
Calcium: 8.8 mg/dL — ABNORMAL LOW (ref 8.9–10.3)
Chloride: 106 mmol/L (ref 98–111)
Creatinine, Ser: 1.06 mg/dL (ref 0.61–1.24)
GFR calc Af Amer: >60 mL/min (ref >60)
GFR calc non Af Amer: >60 mL/min (ref >60)
Glucose, Bld: 104 mg/dL — ABNORMAL HIGH (ref 70–99)
Potassium: 3.5 mmol/L (ref 3.5–5.1)
Sodium: 140 mmol/L (ref 135–145)

## 2018-10-10 NOTE — Telephone Encounter (Signed)
As per Abelino Derrick, [employer], the case has been closed. They provided patient with the information needed about medicaid

## 2018-10-10 NOTE — Telephone Encounter (Signed)
I called patient and he is scheduled for first time visit with Dr. Ninfa Linden on 10/16/18.

## 2018-10-11 LAB — SARS CORONAVIRUS 2 (TAT 6-24 HRS): SARS Coronavirus 2: NEGATIVE

## 2018-10-12 ENCOUNTER — Encounter (HOSPITAL_COMMUNITY)
Admission: RE | Disposition: A | Discharge status: Home / Self Care | Payer: Self-pay | Source: Home / Self Care | Attending: Urology

## 2018-10-12 ENCOUNTER — Ambulatory Visit (HOSPITAL_COMMUNITY): Payer: Self-pay | Admitting: Anesthesiology

## 2018-10-12 ENCOUNTER — Ambulatory Visit (HOSPITAL_COMMUNITY)
Admission: RE | Admit: 2018-10-12 | Discharge: 2018-10-12 | Disposition: A | Discharge status: Home / Self Care | Payer: Self-pay | Attending: Nurse Practitioner | Admitting: Nurse Practitioner

## 2018-10-12 DIAGNOSIS — I10 Essential (primary) hypertension: Secondary | ICD-10-CM | POA: Insufficient documentation

## 2018-10-12 DIAGNOSIS — N471 Phimosis: Secondary | ICD-10-CM | POA: Insufficient documentation

## 2018-10-12 HISTORY — PX: CIRCUMCISION: SHX1350

## 2018-10-12 SURGERY — CIRCUMCISION, ADULT
Anesthesia: Monitor Anesthesia Care | Site: Penis

## 2018-10-12 MED ORDER — LIDOCAINE HCL 1 % IJ SOLN
INTRAMUSCULAR | Status: DC | PRN
  Administered 2018-10-12: 20 mL via INTRAMUSCULAR
  Administered 2018-10-12: 10 mL via INTRAMUSCULAR

## 2018-10-12 MED ORDER — PROMETHAZINE HCL 25 MG/ML IJ SOLN: 6.2500 mg | INTRAMUSCULAR | Status: DC | PRN

## 2018-10-12 MED ORDER — 0.9 % SODIUM CHLORIDE (POUR BTL) OPTIME
TOPICAL | Status: DC | PRN
  Administered 2018-10-12: 1000 mL

## 2018-10-12 MED ORDER — FENTANYL CITRATE (PF) 100 MCG/2ML IJ SOLN
INTRAMUSCULAR | Status: DC | PRN
  Administered 2018-10-12: 50 ug via INTRAVENOUS
  Administered 2018-10-12 (×2): 25 ug via INTRAVENOUS

## 2018-10-12 MED ORDER — MEPERIDINE HCL 50 MG/ML IJ SOLN: 6.2500 mg | INTRAMUSCULAR | Status: DC | PRN

## 2018-10-12 MED ORDER — PROPOFOL 10 MG/ML IV BOLUS
INTRAVENOUS | Status: AC
  Filled 2018-10-12: qty 20

## 2018-10-12 MED ORDER — LACTATED RINGERS IV SOLN
Freq: Once | INTRAVENOUS | Status: AC
  Administered 2018-10-12: 12:00:00 via INTRAVENOUS

## 2018-10-12 MED ORDER — BUPIVACAINE HCL (PF) 0.25 % IJ SOLN
INTRAMUSCULAR | Status: AC
  Filled 2018-10-12: qty 30

## 2018-10-12 MED ORDER — LIDOCAINE HCL (PF) 1 % IJ SOLN
INTRAMUSCULAR | Status: AC
  Filled 2018-10-12: qty 30

## 2018-10-12 MED ORDER — LACTATED RINGERS IV SOLN
INTRAVENOUS | Status: DC | PRN
  Administered 2018-10-12: 12:00:00 via INTRAVENOUS

## 2018-10-12 MED ORDER — PROPOFOL 10 MG/ML IV BOLUS: INTRAVENOUS | Status: DC | PRN

## 2018-10-12 MED ORDER — FENTANYL CITRATE (PF) 250 MCG/5ML IJ SOLN
INTRAMUSCULAR | Status: AC
  Filled 2018-10-12: qty 5

## 2018-10-12 MED ORDER — PROPOFOL 500 MG/50ML IV EMUL
INTRAVENOUS | Status: DC | PRN
  Administered 2018-10-12: 100 ug/kg/min via INTRAVENOUS
  Administered 2018-10-12: 25 ug/kg/min via INTRAVENOUS

## 2018-10-12 MED ORDER — HYDROMORPHONE HCL 1 MG/ML IJ SOLN: 0.2500 mg | INTRAMUSCULAR | Status: DC | PRN

## 2018-10-12 MED ORDER — PROPOFOL 10 MG/ML IV BOLUS
INTRAVENOUS | Status: DC | PRN
  Administered 2018-10-12: 40 mg via INTRAVENOUS
  Administered 2018-10-12 (×2): 20 mg via INTRAVENOUS
  Administered 2018-10-12: 10 mg via INTRAVENOUS
  Administered 2018-10-12: 20 mg via INTRAVENOUS

## 2018-10-12 MED ORDER — CEFAZOLIN SODIUM-DEXTROSE 2-4 GM/100ML-% IV SOLN
2.0000 g | INTRAVENOUS | Status: AC
  Administered 2018-10-12: 12:00:00 2 g via INTRAVENOUS
  Filled 2018-10-12: qty 100

## 2018-10-12 SURGICAL SUPPLY — 25 items
BANDAGE COBAN STERILE 2 (GAUZE/BANDAGES/DRESSINGS) ×3 IMPLANT
BNDG CONFORM 2 STRL LF (GAUZE/BANDAGES/DRESSINGS) ×3 IMPLANT
CLOTH BEACON ORANGE TIMEOUT ST (SAFETY) ×3 IMPLANT
COVER LIGHT HANDLE STERIS (MISCELLANEOUS) ×6 IMPLANT
COVER WAND RF STERILE (DRAPES) ×3 IMPLANT
DECANTER SPIKE VIAL GLASS SM (MISCELLANEOUS) ×3 IMPLANT
DRSG XEROFORM 1X8 (GAUZE/BANDAGES/DRESSINGS) ×3 IMPLANT
ELECT NEEDLE TIP 2.8 STRL (NEEDLE) ×3 IMPLANT
ELECT REM PT RETURN 9FT ADLT (ELECTROSURGICAL) ×3
ELECTRODE REM PT RTRN 9FT ADLT (ELECTROSURGICAL) ×1 IMPLANT
GAUZE PETROLATUM 1 X8 (GAUZE/BANDAGES/DRESSINGS) ×3 IMPLANT
GAUZE SPONGE 4X4 12PLY STRL (GAUZE/BANDAGES/DRESSINGS) ×3 IMPLANT
GLOVE BIO SURGEON STRL SZ7 (GLOVE) ×3 IMPLANT
GLOVE BIOGEL M 8.0 STRL (GLOVE) ×3 IMPLANT
GOWN STRL REUS W/TWL LRG LVL3 (GOWN DISPOSABLE) ×3 IMPLANT
GOWN STRL REUS W/TWL XL LVL3 (GOWN DISPOSABLE) ×3 IMPLANT
KIT TURNOVER KIT A (KITS) ×3 IMPLANT
NEEDLE HYPO 22GX1.5 SAFETY (NEEDLE) ×3 IMPLANT
NS IRRIG 1000ML POUR BTL (IV SOLUTION) ×3 IMPLANT
PACK MINOR (CUSTOM PROCEDURE TRAY) ×3 IMPLANT
SET BASIN LINEN APH (SET/KITS/TRAYS/PACK) ×3 IMPLANT
SOL PREP PROV IODINE SCRUB 4OZ (MISCELLANEOUS) ×3 IMPLANT
SUT CHROMIC 4 0 RB 1X27 (SUTURE) ×6 IMPLANT
SUT CHROMIC 5 0 RB 1 27 (SUTURE) IMPLANT
TOWEL OR 17X26 4PK STRL BLUE (TOWEL DISPOSABLE) ×3 IMPLANT

## 2018-10-12 NOTE — Anesthesia Preprocedure Evaluation (Signed)
Anesthesia Evaluation  Patient identified by MRN, date of birth, ID band Patient awake    Reviewed: Allergy & Precautions, NPO status , Patient's Chart, lab work & pertinent test results  Airway Mallampati: III  TM Distance: >3 FB Neck ROM: Full    Dental  (+) Chipped, Dental Advisory Given, Missing   Pulmonary neg pulmonary ROS,    Pulmonary exam normal breath sounds clear to auscultation       Cardiovascular hypertension, Pt. on medications Normal cardiovascular exam Rhythm:Regular Rate:Normal  IMPRESSIONS    1. 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 The cavity size was moderately dilated. There is moderately increased left ventricular wall thickness. Left ventricular diastolic Doppler  parameters are consistent with pseudonormalization.  2. The right ventricle has mildly reduced systolic function. The cavity was mildly enlarged. There is no increase in right ventricular wall thickness.  3. Left atrial size was severely dilated.  4. The mitral valve is grossly normal.  5. The tricuspid valve is grossly normal.  6. The aorta is normal unless otherwise noted.  7. The aortic root and ascending aorta are normal in size and structure.  8. The atrial septum is grossly normal.  [id number] 12:35:40 [facility]/ED ROUTINE RECORD Sinus rhythm Atrial premature complex Nonspecific IVCD with LAD Probable inferior infarct, age indeterminate Anterolateral infarct, age indeterminate Confirmed by Gerlene Fee [phone]) on 07/09/2018 2:04:57 PM   Neuro/Psych negative neurological ROS  negative psych ROS   GI/Hepatic negative GI ROS, Neg liver ROS,   Endo/Other  negative endocrine ROS  Renal/GU negative Renal ROS  negative genitourinary   Musculoskeletal negative musculoskeletal ROS (+)   Abdominal   Peds  Hematology negative hematology  ROS (+)   Anesthesia Other Findings   Reproductive/Obstetrics negative OB ROS                             Anesthesia Physical Anesthesia Plan  ASA: III  Anesthesia Plan: MAC   Post-op Pain Management:    Induction:   PONV Risk Score and Plan:   Airway Management Planned: Natural Airway and Nasal Cannula  Additional Equipment:   Intra-op Plan:   Post-operative Plan:   Informed Consent: I have reviewed the patients History and Physical, chart, labs and discussed the procedure including the risks, benefits and alternatives for the proposed anesthesia with the patient or authorized representative who has indicated his/her understanding and acceptance.     Dental advisory given  Plan Discussed with: CRNA  Anesthesia Plan Comments:         Anesthesia Quick Evaluation

## 2018-10-12 NOTE — Addendum Note (Signed)
Addendum  created 10/12/18 1335 by Mickel Baas, CRNA   Intraprocedure Meds edited

## 2018-10-12 NOTE — H&P (Signed)
H&P  Chief Complaint: Phimosis  History of Present Illness: 63 yo male presents for circumcision. HE has severe phimosis which affects his voiding.  Past Medical History:  Diagnosis Date  . Chronic pain of right knee   . Hypertension   . Phimosis     Past Surgical History:  Procedure Laterality Date  . NO PAST SURGERIES      Home Medications:    Allergies: No Known Allergies  Family History  Problem Relation Age of Onset  . Diabetes Mother   . Heart disease Mother     Social History:  reports that he has never smoked. He has never used smokeless tobacco. He reports that he does not drink alcohol or use drugs.  ROS: A complete review of systems was performed.  All systems are negative except for pertinent findings as noted.  Physical Exam:  Vital signs in last 24 hours: Temp:  [98.5 F (36.9 C)] 98.5 F (36.9 C) (09/18 1221) Pulse Rate:  [55] 55 (09/18 1221) Resp:  [18] 18 (09/18 1221) BP: (139)/(73) 139/73 (09/18 1221) SpO2:  [100 %] 100 % (09/18 1221) Constitutional:  Alert and oriented, No acute distress Cardiovascular: Regular rate  Respiratory: Normal respiratory effort GI: Abdomen is soft, nontender, nondistended, no abdominal masses. No CVAT.  Genitourinary: Phallus uncircumcised w/ severe phimosis., testes are descended bilaterally and non-tender and without masses, scrotum is normal in appearance without lesions or masses, perineum is normal on inspection. Lymphatic: No lymphadenopathy Neurologic: Grossly intact, no focal deficits Psychiatric: Normal mood and affect  Laboratory Data:  No results for input(s): WBC, HGB, HCT, PLT in the last 72 hours.  Recent Labs    10/10/18 1515  NA 140  K 3.5  CL 106  GLUCOSE 104*  BUN 27*  CALCIUM 8.8*  CREATININE 1.06     No results found for this or any previous visit (from the past 24 hour(s)). Recent Results (from the past 240 hour(s))  SARS CORONAVIRUS 2 (TAT 6-24 HRS) Nasopharyngeal Nasopharyngeal  Swab     Status: None   Collection Time: 10/10/18  7:09 AM   Specimen: Nasopharyngeal Swab  Result Value Ref Range Status   SARS Coronavirus 2 NEGATIVE NEGATIVE Final    Comment: (NOTE) SARS-CoV-2 target nucleic acids are NOT DETECTED. The SARS-CoV-2 RNA is generally detectable in upper and lower respiratory specimens during the acute phase of infection. Negative results do not preclude SARS-CoV-2 infection, do not rule out co-infections with other pathogens, and should not be used as the sole basis for treatment or other patient management decisions. Negative results must be combined with clinical observations, patient history, and epidemiological information. The expected result is Negative. Fact Sheet for Patients: SugarRoll.be Fact Sheet for Healthcare Providers: https://www.woods-mathews.com/ This test is not yet approved or cleared by the [place] FDA and  has been authorized for detection and/or diagnosis of SARS-CoV-2 by FDA under an Emergency Use Authorization (EUA). This EUA will remain  in effect (meaning this test can be used) for the duration of the COVID-19 declaration under Section 56 4(b)(1) of the Act, 21 U.S.C. section 360bbb-3(b)(1), unless the authorization is terminated or revoked sooner. Performed at [facility], [place] [address], [place], [place] [address]     Renal Function: Recent Labs    10/10/18 1515  CREATININE 1.06   Estimated Creatinine Clearance: 85.3 mL/min (by C-G formula based on SCr of 1.06 mg/dL).  Radiologic Imaging: No results found.  Impression/Assessment:  Phimosis, severe  Plan:  Circumcision

## 2018-10-12 NOTE — Transfer of Care (Signed)
Immediate Anesthesia Transfer of Care Note  Patient: Frank Love  Procedure(s) Performed: CIRCUMCISION ADULT (N/A Penis)  Patient Location: PACU  Anesthesia Type:MAC  Level of Consciousness: awake, alert , oriented and patient cooperative  Airway & Oxygen Therapy: Patient Spontanous Breathing  Post-op Assessment: Report given to RN and Post -op Vital signs reviewed and stable  Post vital signs: Reviewed and stable  Last Vitals:  Vitals Value Taken Time  BP 121/69 10/12/18 1330  Temp    Pulse 56 10/12/18 1333  Resp 15 10/12/18 1333  SpO2 96 % 10/12/18 1333  Vitals shown include unvalidated device data.  Last Pain:  Vitals:   10/12/18 1221  PainSc: 0-No pain      Patients Stated Pain Goal: 5 (37/94/32 7614)  Complications: No apparent anesthesia complications

## 2018-10-12 NOTE — Progress Notes (Signed)
In PACU. Awake. Talking. Denies pain. Wants to go home. Dressed and discharged to home from PACU.

## 2018-10-12 NOTE — Op Note (Signed)
Preoperative diagnosis: Phimosis Postoperative diagnosis: Same  Procedure: Circumcision Surgeon: Lillette Boxer. Paetyn Pietrzak, MD. Anesthesia: MAC. with penile block Specimen: foreskin Complications: none SAY:[id number]  Indications: The patient was recently evaluated for phimosis. All risks and benefits of circumcision discussed. Full informed consent obtained. The patient now presents for definitive procedure.  Technique and findings: The patient was brought to the operating room. Successful induction of MAC the patient was then prepped and draped in usual manner. Appropriate surgical timeout was performed. A penile block was then performed with 20 cc of quarter percent plain Marcaine/1% plain lidocaine.  The proximal sleeve/incision site was marked with a pen.  It was very difficult entering the distal foreskin as the phimosis was severe.  Once the fibrotic band was carefully incised, taking great care to avoid injury to the glans, the distal sleeve was marked.  The foreskin was then carefully resected using electrocautery.  Small bleeders were coagulated.  Hemostatis was achieved using electrocautery.Quadrant sutures were placed with interrupted 4-0 chromic, with the phrenular suture being a "U" stitch. In between, the same 4-0 chromic was used to re approximate the skin edges with a running simple stitch.  The incision was wrapped with Xeroform gauze, a plain guaze wrap and Coban dressing. The patient was brought to recovery room in stable condition having had no obvious complications or problems. Sponge and needle counts were correct.

## 2018-10-12 NOTE — Discharge Instructions (Signed)
Postoperative instructions for circumcision  Wound:  Remove the dressing the morning after surgery. In most cases your incision will have absorbable sutures that run along the course of your incision and will dissolve within the first 10-20 days. Some will fall out even earlier. Expect some redness as the sutures dissolved but this should occur only around the sutures. If there is generalized redness, especially with increasing pain or swelling, let us know. The penis will possibly get "black and blue" as the blood in the tissues spread. Sometimes the whole penis will turn colors. The black and blue is followed by a yellow and brown color. In time, all the discoloration will go away.  Diet:  You may return to your normal diet within 24 hours following your surgery. You may note some mild nausea and possibly vomiting the first 6-8 hours following surgery. This is usually due to the side effects of anesthesia, and will disappear quite soon. I would suggest clear liquids and a very light meal the first evening following your surgery.  Activity:  Your physical activity should be restricted the first 48 hours. During that time you should remain relatively inactive, moving about only when necessary. During the first 7-10 days following surgery he should avoid lifting any heavy objects (anything greater than 15 pounds), and avoid strenuous exercise. If you work, ask [place] specifically about your restrictions, both for work and home. We will write a note to your employer if needed.  Ice packs can be placed on and off over the penis for the first 48 hours to help relieve the pain and keep the swelling down. Frozen peas or corn in a ZipLock bag can be frozen, used and re-frozen. Fifteen minutes on and 15 minutes off is a reasonable schedule.  No sexual activity for 1 month.  Hygiene:  You may shower 24 hours after your surgery. Make sure wound is clean and dry afterwards. Tub bathing should be restricted  until the seventh day.  Medication:  If you have pain, it is okay to take Tylenol, ibuprofen or naproxen.  Problems you should report to [place]:   Fever of 101.0 degrees Fahrenheit or greater.  Moderate or severe swelling under the skin incision or involving the scrotum.  Drug reaction such as hives, a rash, nausea or vomiting.   Difficulty voiding     Monitored Anesthesia Care, Care After These instructions provide you with information about caring for yourself after your procedure. Your health care provider may also give you more specific instructions. Your treatment has been planned according to current medical practices, but problems sometimes occur. Call your health care provider if you have any problems or questions after your procedure. What can I expect after the procedure? After your procedure, you may:  Feel sleepy for several hours.  Feel clumsy and have poor balance for several hours.  Feel forgetful about what happened after the procedure.  Have poor judgment for several hours.  Feel nauseous or vomit.  Have a sore throat if you had a breathing tube during the procedure. Follow these instructions at home: For at least 24 hours after the procedure:      Have a responsible adult stay with you. It is important to have someone help care for you until you are awake and alert.  Rest as needed.  Do not: ? Participate in activities in which you could fall or become injured. ? Drive. ? Use heavy machinery. ? Drink alcohol. ? Take sleeping pills or medicines that  cause drowsiness. ? Make important decisions or sign legal documents. ? Take care of children on your own. Eating and drinking  Follow the diet that is recommended by your health care provider.  If you vomit, drink water, juice, or soup when you can drink without vomiting.  Make sure you have little or no nausea before eating solid foods. General instructions  Take over-the-counter and  prescription medicines only as told by your health care provider.  If you have sleep apnea, surgery and certain medicines can increase your risk for breathing problems. Follow instructions from your health care provider about wearing your sleep device: ? Anytime you are sleeping, including during daytime naps. ? While taking prescription pain medicines, sleeping medicines, or medicines that make you drowsy.  If you smoke, do not smoke without supervision.  Keep all follow-up visits as told by your health care provider. This is important. Contact a health care provider if:  You keep feeling nauseous or you keep vomiting.  You feel light-headed.  You develop a rash.  You have a fever. Get help right away if:  You have trouble breathing. Summary  For several hours after your procedure, you may feel sleepy and have poor judgment.  Have a responsible adult stay with you for at least 24 hours or until you are awake and alert. This information is not intended to replace advice given to you by your health care provider. Make sure you discuss any questions you have with your health care provider. Document Released: 05/03/2015 Document Revised: 04/10/2017 Document Reviewed: 05/03/2015 Elsevier Patient Education  2020 [employer].

## 2018-10-12 NOTE — Anesthesia Postprocedure Evaluation (Signed)
Anesthesia Post Note  Patient: Toriano Aikey Cibrian  Procedure(s) Performed: CIRCUMCISION ADULT (N/A Penis)  Patient location during evaluation: PACU Anesthesia Type: MAC Level of consciousness: awake and alert, oriented and patient cooperative Pain management: pain level controlled Vital Signs Assessment: post-procedure vital signs reviewed and stable Respiratory status: spontaneous breathing Cardiovascular status: stable Postop Assessment: no apparent nausea or vomiting Anesthetic complications: no     Last Vitals:  Vitals:   10/12/18 1221  BP: 139/73  Pulse: (!) 55  Resp: 18  Temp: 36.9 C  SpO2: 100%    Last Pain:  Vitals:   10/12/18 1221  PainSc: 0-No pain                 Donnalee Cellucci A

## 2018-10-12 NOTE — Anesthesia Procedure Notes (Signed)
Procedure Name: MAC Date/Time: 10/12/2018 12:40 PM Performed by: Vista Deck, CRNA Pre-anesthesia Checklist: Patient identified, Emergency Drugs available, Suction available, Timeout performed and Patient being monitored Patient Re-evaluated:Patient Re-evaluated prior to induction Oxygen Delivery Method: Nasal Cannula

## 2018-10-15 ENCOUNTER — Encounter (HOSPITAL_COMMUNITY): Payer: Self-pay | Admitting: Urology

## 2018-10-16 ENCOUNTER — Ambulatory Visit (INDEPENDENT_AMBULATORY_CARE_PROVIDER_SITE_OTHER): Payer: Self-pay | Admitting: Orthopaedic Surgery

## 2018-10-16 VITALS — Ht 75.0 in | Wt 221.0 lb

## 2018-10-16 DIAGNOSIS — M25551 Pain in right hip: Secondary | ICD-10-CM

## 2018-10-16 DIAGNOSIS — M1611 Unilateral primary osteoarthritis, right hip: Secondary | ICD-10-CM

## 2018-10-16 NOTE — Progress Notes (Signed)
Office Visit Note   Patient: Frank Love           Date of Birth: 1955-05-26           MRN: [id number] Visit Date: 10/16/2018              Requested by: [name], NP [place],  [place] [address] PCP: [name], NP   Assessment & Plan: Visit Diagnoses:  1. Primary osteoarthritis of right hip   2. Right hip pain     Plan: We will likely set him up for surgery sometime later in November for a right total hip arthroplasty.  I showed him a hip model explained in detail what the surgery involves.  We had a long and thorough discussion about the risk and the risk of surgery.  He does see Dr. Percival Spanish in the near future so we would like to have cardiac clearance for the surgery.  We would likely again schedule this in November once he is completely healed from the circumcision so we can safely consider a Foley placement for surgery given the fact that we would do this under spinal anesthesia.  All question concerns were answered addressed.  We will see him back in 3 weeks to hopefully work on getting him scheduled for surgery.  Follow-Up Instructions: Return in about 3 weeks (around 11/06/2018).   Orders:  No orders of the defined types were placed in this encounter.  No orders of the defined types were placed in this encounter.     Procedures: No procedures performed   Clinical Data: No additional findings.   Subjective: Chief Complaint  Patient presents with  . Right Hip - Pain  The patient is well-known to [place].  He has severe debilitating arthritis in his right hip.  He did try an intra-articular steroid injection by Dr. Ernestina Patches.  He said that was wonderful for 3 weeks but then his pain came back.  He has well-documented evidence of severe end-stage arthritis of the right hip.  He does ambulate using a cane.  His pain is daily in his right hip and is detrimentally affecting his mobility, his quality of life and his actives the living.  He has  been dealing with this for over 12 months now.  He has tried and failed all forms conservative treatment including activity modification and strengthening exercises as well as a steroid injection.  He is being closely followed by his cardiologist Dr. Percival Spanish who is treating him for congestive heart failure.  He recently had a urologic procedure which was a circumcision and is wanting healing with this.  He understands that we would need cardiac clearance and clearance from neurologist before surgery is scheduled on his right hip.  He would likely need to have a catheter placed because we would usually do this under spinal anesthesia and he understands this as well.  His right hip is hurting him on a daily basis and it is 10 out of 10.  HPI  Review of Systems He currently denies any headache, chest pain, shortness of breath, fever, chills, nausea, vomiting  Objective: Vital Signs: Ht 6\' 3"  (1.905 m)   Wt 221 lb (100.2 kg)   BMI 27.62 kg/m   Physical Exam He is alert and oriented x3 and in no acute distress.  He walks slowly using a cane in his opposite hand. Ortho Exam Examination of his right hip shows significant stiffness and pain on any attempts of  internal or external rotation as well as flexion.  His left hip exam is entirely normal. Specialty Comments:  No specialty comments available.  Imaging: No results found. Previous x-rays of his pelvis and right hip are shared with him again today and this shows severe end-stage arthritis of the right hip.  There is complete loss of joint space.  There is cystic changes in the femoral head and the acetabulum.  There is also sclerotic changes and osteophytes.  PMFS History: There are no active problems to display for this patient.  Past Medical History:  Diagnosis Date  . Chronic pain of right knee   . Hypertension   . Phimosis     Family History  Problem Relation Age of Onset  . Diabetes Mother   . Heart disease Mother     Past  Surgical History:  Procedure Laterality Date  . CIRCUMCISION N/A 10/12/2018   Procedure: CIRCUMCISION ADULT;  Surgeon: Franchot Gallo, MD;  Location: [facility];  Service: Urology;  Laterality: N/A;  45 mins  . NO PAST SURGERIES     Social History   Occupational History  . Not on file  Tobacco Use  . Smoking status: Never Smoker  . Smokeless tobacco: Never Used  Substance and Sexual Activity  . Alcohol use: No    Alcohol/week: 0.0 standard drinks  . Drug use: No  . Sexual activity: Yes

## 2018-10-17 ENCOUNTER — Other Ambulatory Visit: Payer: Self-pay | Admitting: Urology

## 2018-10-17 LAB — SURGICAL PATHOLOGY

## 2018-10-18 ENCOUNTER — Encounter: Payer: Self-pay | Admitting: Nurse Practitioner

## 2018-10-18 ENCOUNTER — Other Ambulatory Visit: Payer: Self-pay | Admitting: Nurse Practitioner

## 2018-10-18 DIAGNOSIS — R7989 Other specified abnormal findings of blood chemistry: Secondary | ICD-10-CM

## 2018-10-18 MED ORDER — METOPROLOL TARTRATE 25 MG PO TABS: 25.0000 mg | ORAL_TABLET | Freq: Two times a day (BID) | ORAL | 2 refills | Status: DC

## 2018-10-18 NOTE — Telephone Encounter (Signed)
Please fill if appropriate.  

## 2018-10-19 ENCOUNTER — Other Ambulatory Visit: Payer: Self-pay | Admitting: Nurse Practitioner

## 2018-10-19 DIAGNOSIS — I10 Essential (primary) hypertension: Secondary | ICD-10-CM

## 2018-10-19 DIAGNOSIS — R7989 Other specified abnormal findings of blood chemistry: Secondary | ICD-10-CM

## 2018-10-19 MED ORDER — METOPROLOL TARTRATE 25 MG PO TABS: 25.0000 mg | ORAL_TABLET | Freq: Two times a day (BID) | ORAL | 1 refills | Status: DC

## 2018-10-19 MED ORDER — METOPROLOL TARTRATE 25 MG PO TABS: 25.0000 mg | ORAL_TABLET | Freq: Two times a day (BID) | ORAL | 2 refills | Status: DC

## 2018-10-19 MED ORDER — LISINOPRIL 40 MG PO TABS: 40.0000 mg | ORAL_TABLET | Freq: Every day | ORAL | 1 refills | Status: DC

## 2018-10-22 DIAGNOSIS — M25551 Pain in right hip: Secondary | ICD-10-CM

## 2018-10-22 MED ORDER — TRIAMCINOLONE ACETONIDE 40 MG/ML IJ SUSP
60.0000 mg | INTRAMUSCULAR | Status: AC | PRN
  Administered 2018-10-22: 06:00:00 60 mg via INTRA_ARTICULAR

## 2018-10-22 MED ORDER — BUPIVACAINE HCL 0.25 % IJ SOLN
4.0000 mL | INTRAMUSCULAR | Status: AC | PRN
  Administered 2018-10-22: 06:00:00 4 mL via INTRA_ARTICULAR

## 2018-10-22 NOTE — Progress Notes (Signed)
   Frank Love - 63 y.o. male MRN [id number]  Date of birth: [birth year]  Office Visit Note: Visit Date: 09/05/2018 PCP: [name], NP Referred by: [name], NP  Subjective: Chief Complaint  Patient presents with  . Right Hip - Pain   HPI:  Frank Love is a 63 y.o. male who comes in today At the request of Dr. Jean Rosenthal for diagnostic and for therapeutic anesthetic hip arthrogram on the right.  Patient is having mostly posterior hip pain more than groin pain.  He does have pain with movement and getting in and out of the car and with standing.  No radicular pain or paresthesia.  Symptoms have been progressive for 4 months to the point where now it is fairly severe.  ROS Otherwise per HPI.  Assessment & Plan: Visit Diagnoses:  1. Pain in right hip     Plan: No additional findings.   Meds & Orders: No orders of the defined types were placed in this encounter.   Orders Placed This Encounter  Procedures  . Large Joint Inj  . XR C-ARM NO REPORT    Follow-up: No follow-ups on file.   Procedures: Large Joint Inj: R hip joint on 10/22/2018 5:47 AM Indications: pain and diagnostic evaluation Details: 22 G needle, anterior approach  Arthrogram: Yes  Medications: 4 mL bupivacaine 0.25 %; 60 mg triamcinolone acetonide 40 MG/ML Outcome: tolerated well, no immediate complications  Arthrogram demonstrated excellent flow of contrast throughout the joint surface without extravasation or obvious defect.  The patient had relief of symptoms during the anesthetic phase of the injection.  Procedure, treatment alternatives, risks and benefits explained, specific risks discussed. Consent was given by the patient. Immediately prior to procedure a time out was called to verify the correct patient, procedure, equipment, support staff and site/side marked as required. Patient was prepped and draped in the usual sterile fashion.      No notes on file    Clinical History: No specialty comments available.     Objective:  VS:  HT:    WT:   BMI:     BP:   HR: bpm  TEMP: ( )  RESP:  Physical Exam  Ortho Exam Imaging: No results found.

## 2018-10-23 ENCOUNTER — Other Ambulatory Visit: Payer: Self-pay | Admitting: Nurse Practitioner

## 2018-10-23 DIAGNOSIS — K089 Disorder of teeth and supporting structures, unspecified: Secondary | ICD-10-CM

## 2018-10-28 DIAGNOSIS — I5022 Chronic systolic (congestive) heart failure: Secondary | ICD-10-CM | POA: Insufficient documentation

## 2018-10-28 DIAGNOSIS — I5023 Acute on chronic systolic (congestive) heart failure: Secondary | ICD-10-CM | POA: Insufficient documentation

## 2018-10-28 DIAGNOSIS — Z01810 Encounter for preprocedural cardiovascular examination: Secondary | ICD-10-CM | POA: Insufficient documentation

## 2018-10-28 DIAGNOSIS — R9431 Abnormal electrocardiogram [ECG] [EKG]: Secondary | ICD-10-CM | POA: Insufficient documentation

## 2018-10-28 NOTE — Progress Notes (Signed)
Cardiology Office Note   Date:  10/29/2018   ID:  Frank Love, DOB December 25, 1955, MRN [id number]  PCP:  [name], NP  Cardiologist:   [name], MD Referring:  [name], NP  Chief Complaint  Patient presents with  . Cardiomyopathy      History of Present Illness: Frank Love is a 63 y.o. male who was referred by [name], NP for evaluation of lower extremity edema and an elevated BNP  After my initial visit with him I sent him for an echo.  He had a moderately reduced EF.  I did approve him to have a circumcision.  However, he is also to have a hip surgery and I requested that he come back to discuss possible cardiac cath vs other study prior to that given his markedly abnormal EKG, low function study and the abnormal echo.   He feels much better since he had his surgery.  He is able to urinate better.  He has had much less lower extremity swelling possibly is related to this.  He is mostly limited by his hip which causes knee pain.  Gets around slowly with a cane. The patient denies any new symptoms such as chest discomfort, neck or arm discomfort. There has been no new shortness of breath, PND or orthopnea. There have been no reported palpitations, presyncope or syncope.   Past Medical History:  Diagnosis Date  . Chronic pain of right knee   . Hypertension   . Phimosis     Past Surgical History:  Procedure Laterality Date  . CIRCUMCISION N/A 10/12/2018   Procedure: CIRCUMCISION ADULT;  Surgeon: Franchot Gallo, MD;  Location: [facility];  Service: Urology;  Laterality: N/A;  45 mins  . NO PAST SURGERIES       Current Outpatient Medications  Medication Sig Dispense Refill  . metoprolol tartrate (LOPRESSOR) 25 MG tablet Take 1 tablet (25 mg total) by mouth 2 (two) times daily. 180 tablet 1  . furosemide (LASIX) 40 MG tablet Take 1 tablet (40 mg total) by mouth daily. 30 tablet 0  . sacubitril-valsartan (ENTRESTO) 24-26 MG Take 1 tablet  by mouth 2 (two) times daily. 60 tablet 3   No current facility-administered medications for this visit.     Allergies:   Patient has no known allergies.    ROS:  Please see the history of present illness.   Otherwise, review of systems are positive for none .   All other systems are reviewed and negative.    PHYSICAL EXAM: VS:  BP 133/68   Pulse (!) 55   Temp 97.9 F (36.6 C)   Ht 6\' 3"  (1.905 m)   Wt 231 lb 12.8 oz (105.1 kg)   SpO2 98%   BMI 28.97 kg/m  , BMI Body mass index is 28.97 kg/m. GENERAL:  Well appearing NECK:  No jugular venous distention, waveform within normal limits, carotid upstroke brisk and symmetric, no bruits, no thyromegaly LUNGS:  Clear to auscultation bilaterally CHEST:  Unremarkable HEART:  PMI not displaced or sustained,S1 and S2 within normal limits, no S3, no S4, no clicks, no rubs, no murmurs ABD:  Flat, positive bowel sounds normal in frequency in pitch, no bruits, no rebound, no guarding, no midline pulsatile mass, no hepatomegaly, no splenomegaly EXT:  2 plus pulses throughout, bilateral leg edema, mild , no cyanosis no clubbin   EKG:  EKG is not ordered today.   Recent Labs: 07/09/2018: Hemoglobin 14.6;  Platelets 168 08/03/2018: ALT 35; BNP 316.2 10/10/2018: BUN 27; Creatinine, Ser 1.06; Potassium 3.5; Sodium 140    Lipid Panel    Component Value Date/Time   CHOL 182 08/03/2018 1024   TRIG 64 08/03/2018 1024   HDL 53 08/03/2018 1024   CHOLHDL 3.4 08/03/2018 1024   LDLCALC 116 (H) 08/03/2018 1024      Wt Readings from Last 3 Encounters:  10/29/18 231 lb 12.8 oz (105.1 kg)  10/16/18 221 lb (100.2 kg)  10/10/18 220 lb (99.8 kg)      Other studies Reviewed: Additional studies/ records that were reviewed today include: Echo Review of the above records demonstrates:  Please see elsewhere in the note.     ASSESSMENT AND PLAN:  CHRONIC SYSTOLIC HF:  I presume an ischemic etiology.     I am going to screen for coronary artery  disease with a coronary CT.  I would begin to titrate his medications.  His heart rate will not really allow up titration of his beta-blocker at this point.  I am to switch him to [facility] with the requisite 36-hour washout of his ACE inhibitor.  We reviewed the anatomy and physiology of this.  We talked about salt restriction.  Can come back in 2 weeks for med titration will probably need a basic metabolic profile at that time.    PREOP: I am going to hold on clearing him for hip surgery I will consider this after his CT  ABNORMAL EKG:  As above.    Current medicines are reviewed at length with the patient today.  The patient does not have concerns regarding medicines.  The following changes have been made:  As above  Labs/ tests ordered today include:   Orders Placed This Encounter  Procedures  . CT CORONARY MORPH W/CTA COR W/SCORE W/CA W/CM &/OR WO/CM  . CT CORONARY FRACTIONAL FLOW RESERVE DATA PREP  . CT CORONARY FRACTIONAL FLOW RESERVE FLUID ANALYSIS  . Basic metabolic panel     Disposition:   FU with APP in two weeks.  Ronnell Guadalajara, MD  10/29/2018 10:29 AM    [facility]

## 2018-10-29 ENCOUNTER — Ambulatory Visit (INDEPENDENT_AMBULATORY_CARE_PROVIDER_SITE_OTHER): Payer: No Typology Code available for payment source | Admitting: Cardiology

## 2018-10-29 ENCOUNTER — Encounter: Payer: Self-pay | Admitting: Cardiology

## 2018-10-29 ENCOUNTER — Other Ambulatory Visit: Payer: Self-pay

## 2018-10-29 VITALS — BP 133/68 | HR 55 | Temp 97.9°F | Ht 75.0 in | Wt 231.8 lb

## 2018-10-29 DIAGNOSIS — Z01810 Encounter for preprocedural cardiovascular examination: Secondary | ICD-10-CM

## 2018-10-29 DIAGNOSIS — R9431 Abnormal electrocardiogram [ECG] [EKG]: Secondary | ICD-10-CM

## 2018-10-29 DIAGNOSIS — I428 Other cardiomyopathies: Secondary | ICD-10-CM

## 2018-10-29 DIAGNOSIS — I5022 Chronic systolic (congestive) heart failure: Secondary | ICD-10-CM

## 2018-10-29 DIAGNOSIS — Z01812 Encounter for preprocedural laboratory examination: Secondary | ICD-10-CM

## 2018-10-29 MED ORDER — ENTRESTO 24-26 MG PO TABS: 1.0000 | ORAL_TABLET | Freq: Two times a day (BID) | ORAL | 3 refills | Status: DC

## 2018-10-29 NOTE — Patient Instructions (Addendum)
Medication Instructions:  Stop taking Lisinopril.  Start taking Entresto 24/26 twice daily on Wednesday 10/31/18.   If you need a refill on your cardiac medications before your next appointment, please call your pharmacy.   Lab work: BMET one week prior to CTA. If you have labs (blood work) drawn today and your tests are completely normal, you will receive your results only by: [place] (if you have MyChart) OR A paper copy in the mail If you have any lab test that is abnormal or we need to change your treatment, we will call you to review the results.  Testing/Procedures: Non-Cardiac CT Angiography (CTA), is a special type of CT scan that uses a computer to produce multi-dimensional views of major blood vessels throughout the body. In CT angiography, a contrast material is injected through an IV to help visualize the blood vessels   Follow-Up: At [facility], you and your health needs are our priority.  As part of our continuing mission to provide you with exceptional heart care, we have created designated Provider Care Teams.  These Care Teams include your primary Cardiologist (physician) and Advanced Practice Providers (APPs -  Physician Assistants and Nurse Practitioners) who all work together to provide you with the care you need, when you need it. You will need a follow up appointment in 2 weeks.  Please call our office 2 months in advance to schedule this appointment.  You may see [name], MD or one of the following Advanced Practice Providers on your designated Care Team:   Rosaria Ferries, PA-C Jory Sims, DNP, ANP  Any Other Special Instructions Will Be Listed Below (If Applicable). Make an appointment with Curt Bears or Suanne Marker in two weeks.   Your cardiac CT will be scheduled at one of the below locations:   [facility] [address] [place], [place] [address] [phone]   If scheduled at [facility], please arrive at the [facility] main entrance of [place] 30-45 minutes prior to test start time. Proceed to the [facility] Radiology Department (first floor) to check-in and test prep. .  Please follow these instructions carefully (unless otherwise directed):  Please do lab BMP in one week prior to test.  On the Night Before the Test: . Be sure to Drink plenty of water. . Do not consume any caffeinated/decaffeinated beverages or chocolate 12 hours prior to your test. . Do not take any antihistamines 12 hours prior to your test.  On the Day of the Test: . Drink plenty of water. Do not drink any water within one hour of the test. . Do not eat any food 4 hours prior to the test. . You may take your regular medications prior to the test.  . Take regular dose 25mg  metoprolol (Lopressor) two hours prior to test. . HOLD Furosemide/Hydrochlorothiazide morning of the test.       After the Test: . Drink plenty of water. . After receiving IV contrast, you may experience a mild flushed feeling. This is normal. . On occasion, you may experience a mild rash up to 24 hours after the test. This is not dangerous. If this occurs, you can take Benadryl 25 mg and increase your fluid intake. . If you experience trouble breathing, this can be serious. If it is severe call 911 IMMEDIATELY. If it is mild, please call our office. . If you take any of these medications: Glipizide/Metformin, Avandament, Glucavance, please do not take 48 hours after completing test  unless otherwise instructed.    Please contact the cardiac imaging nurse navigator should you have any questions/concerns Marchia Bond, RN Navigator Cardiac Imaging [place] [facility] [phone] Office  [phone] Cell

## 2018-11-06 ENCOUNTER — Ambulatory Visit: Payer: Self-pay | Admitting: Orthopaedic Surgery

## 2018-11-12 ENCOUNTER — Other Ambulatory Visit: Payer: Self-pay | Admitting: Cardiology

## 2018-11-12 ENCOUNTER — Other Ambulatory Visit: Payer: Self-pay

## 2018-11-12 ENCOUNTER — Ambulatory Visit: Payer: No Typology Code available for payment source | Admitting: Cardiology

## 2018-11-12 MED ORDER — ENTRESTO 24-26 MG PO TABS: 1.0000 | ORAL_TABLET | Freq: Two times a day (BID) | ORAL | 3 refills | Status: DC

## 2018-11-12 NOTE — Telephone Encounter (Signed)
RX sent to pharmacy  

## 2018-11-12 NOTE — Telephone Encounter (Signed)
New Message    Prescription sent to wrong location should have been sent to [facility] and [place].

## 2018-11-12 NOTE — Telephone Encounter (Signed)
°*  STAT* If patient is at the pharmacy, call can be transferred to refill team.   1. Which medications need to be refilled? (please list name of each medication and dose if known)  New prescription fro [facility]- changing pharmacy  2. Which pharmacy/location (including street and city if local pharmacy) is medication to be sent to? [facility]  3. Do they need a 30 day or 90 day supply? 30 days and refills

## 2018-11-13 ENCOUNTER — Telehealth: Payer: Self-pay | Admitting: Cardiology

## 2018-11-13 ENCOUNTER — Other Ambulatory Visit: Payer: Self-pay | Admitting: Cardiology

## 2018-11-13 LAB — BASIC METABOLIC PANEL
BUN/Creatinine Ratio: 23 (ref 10–24)
BUN: 24 mg/dL (ref 8–27)
CO2: 26 mmol/L (ref 20–29)
Calcium: 9.4 mg/dL (ref 8.6–10.2)
Chloride: 102 mmol/L (ref 96–106)
Creatinine, Ser: 1.05 mg/dL (ref 0.76–1.27)
GFR calc Af Amer: 87 mL/min/{1.73_m2} (ref >59)
GFR calc non Af Amer: 75 mL/min/{1.73_m2} (ref >59)
Glucose: 87 mg/dL (ref 65–99)
Potassium: 4.2 mmol/L (ref 3.5–5.2)
Sodium: 142 mmol/L (ref 134–144)

## 2018-11-13 MED ORDER — ENTRESTO 24-26 MG PO TABS: 1.0000 | ORAL_TABLET | Freq: Two times a day (BID) | ORAL | 3 refills | Status: DC

## 2018-11-13 MED FILL — **ENTRESTO 24-26 MG TABLET: 24-26 | 28 days supply | Qty: 56 | Fill #0

## 2018-11-13 NOTE — Telephone Encounter (Signed)
Patient calling the office for samples of medication:   1.  What medication and dosage are you requesting samples for? sacubitril-valsartan (ENTRESTO) 24-26 MG  2.  Are you currently out of this medication? yes

## 2018-11-13 NOTE — Telephone Encounter (Signed)
°*  STAT* If patient is at the pharmacy, call can be transferred to refill team.   1. Which medications need to be refilled? (please list name of each medication and dose if known) sacubitril-valsartan (ENTRESTO) 24-26 MG  2. Which pharmacy/location (including street and city if local pharmacy) is medication to be sent to? [place], [place]  3. Do they need a 30 day or 90 day supply? 90 day

## 2018-11-13 NOTE — Telephone Encounter (Signed)
New message   Per [facility] patient needs a new prescription sent for sacubitril-valsartan (ENTRESTO) 24-26 MG sent to [place], [place] [address]. Please advise

## 2018-11-13 NOTE — Telephone Encounter (Signed)
Medication sent to pharmacy  

## 2018-11-13 NOTE — Telephone Encounter (Signed)
Rx sent to pharmacy. Called pharmacy and verified that the rx was received.   Patient notified directly and voiced understanding.

## 2018-11-13 NOTE — Telephone Encounter (Signed)
Spoke with pt, his prescription has been sent to the pharmacy and he does not need samples anymore.

## 2018-11-14 ENCOUNTER — Ambulatory Visit: Payer: No Typology Code available for payment source | Admitting: Adult Health

## 2018-11-19 ENCOUNTER — Telehealth (HOSPITAL_COMMUNITY): Payer: Self-pay | Admitting: Emergency Medicine

## 2018-11-19 NOTE — Telephone Encounter (Signed)
Pt returning phone call regarding upcoming cardiac imaging study; pt verbalizes understanding of appt date/time, parking situation and where to check in, pre-test NPO status and medications ordered, and verified current allergies; name and call back number provided for further questions should they arise Emireth Cockerham RN Navigator Cardiac Imaging [facility] [phone] office [phone] cell   

## 2018-11-19 NOTE — Telephone Encounter (Signed)
Left message on voicemail with name and callback number Teyah Rossy RN Navigator Cardiac Imaging [facility] [phone] Office [phone] Cell  

## 2018-11-20 ENCOUNTER — Ambulatory Visit (HOSPITAL_COMMUNITY)
Admission: RE | Admit: 2018-11-20 | Discharge: 2018-11-20 | Disposition: A | Discharge status: Home / Self Care | Payer: Self-pay | Source: Ambulatory Visit | Attending: Cardiology | Admitting: Cardiology

## 2018-11-20 ENCOUNTER — Other Ambulatory Visit: Payer: Self-pay

## 2018-11-20 ENCOUNTER — Encounter (HOSPITAL_COMMUNITY): Payer: Self-pay

## 2018-11-20 DIAGNOSIS — I428 Other cardiomyopathies: Secondary | ICD-10-CM

## 2018-11-20 MED ORDER — NITROGLYCERIN 0.4 MG SL SUBL
SUBLINGUAL_TABLET | SUBLINGUAL | Status: AC
  Filled 2018-11-20: qty 2

## 2018-11-20 MED ORDER — IOHEXOL 350 MG/ML SOLN
80.0000 mL | Freq: Once | INTRAVENOUS | Status: AC | PRN
  Administered 2018-11-20: 09:00:00 80 mL via INTRAVENOUS

## 2018-11-20 MED ORDER — NITROGLYCERIN 0.4 MG SL SUBL
0.8000 mg | SUBLINGUAL_TABLET | Freq: Once | SUBLINGUAL | Status: AC
  Administered 2018-11-20: 0.8 mg via SUBLINGUAL

## 2018-11-20 NOTE — Progress Notes (Signed)
Patient states he feels fine and ready to go home. Patient escorted in wheelchair to main lobby due to distance of walk and his preexisting gait problem. Patient declined offer for being wheeled to car or valet services. He states he would like to walk to help "hip from getting too stiff".

## 2018-11-22 ENCOUNTER — Encounter: Payer: Self-pay | Admitting: Adult Health

## 2018-11-22 ENCOUNTER — Telehealth: Payer: Self-pay | Admitting: Cardiology

## 2018-11-22 ENCOUNTER — Telehealth (INDEPENDENT_AMBULATORY_CARE_PROVIDER_SITE_OTHER): Payer: No Typology Code available for payment source | Admitting: Adult Health

## 2018-11-22 ENCOUNTER — Other Ambulatory Visit: Payer: Self-pay | Admitting: Adult Health

## 2018-11-22 VITALS — Ht 75.0 in | Wt 231.0 lb

## 2018-11-22 DIAGNOSIS — I2510 Atherosclerotic heart disease of native coronary artery without angina pectoris: Secondary | ICD-10-CM

## 2018-11-22 DIAGNOSIS — I5022 Chronic systolic (congestive) heart failure: Secondary | ICD-10-CM

## 2018-11-22 DIAGNOSIS — R9389 Abnormal findings on diagnostic imaging of other specified body structures: Secondary | ICD-10-CM

## 2018-11-22 LAB — CBC
Hematocrit: 39.5 % (ref 37.5–51.0)
Hemoglobin: 13.8 g/dL (ref 13.0–17.7)
MCH: 31.2 pg (ref 26.6–33.0)
MCHC: 34.9 g/dL (ref 31.5–35.7)
MCV: 89 fL (ref 79–97)
Platelets: 152 10*3/uL (ref 150–450)
RBC: 4.43 x10E6/uL (ref 4.14–5.80)
RDW: 12 % (ref 11.6–15.4)
WBC: 5.1 10*3/uL (ref 3.4–10.8)

## 2018-11-22 LAB — BASIC METABOLIC PANEL
BUN/Creatinine Ratio: 24 (ref 10–24)
BUN: 24 mg/dL (ref 8–27)
CO2: 24 mmol/L (ref 20–29)
Calcium: 9.1 mg/dL (ref 8.6–10.2)
Chloride: 102 mmol/L (ref 96–106)
Creatinine, Ser: 0.98 mg/dL (ref 0.76–1.27)
GFR calc Af Amer: 94 mL/min/{1.73_m2} (ref >59)
GFR calc non Af Amer: 82 mL/min/{1.73_m2} (ref >59)
Glucose: 84 mg/dL (ref 65–99)
Potassium: 4.1 mmol/L (ref 3.5–5.2)
Sodium: 142 mmol/L (ref 134–144)

## 2018-11-22 MED ORDER — NITROGLYCERIN 0.4 MG SL SUBL: 0.4000 mg | SUBLINGUAL_TABLET | SUBLINGUAL | 3 refills | Status: DC | PRN

## 2018-11-22 MED FILL — NITROGLYCERIN 0.4 MG TAB SL: 0.4 | 25 days supply | Qty: 25 | Fill #0

## 2018-11-22 NOTE — H&P (View-Only) (Signed)
Virtual Visit via Video Note   This visit type was conducted due to national recommendations for restrictions regarding the COVID-19 Pandemic (e.g. social distancing) in an effort to limit this patient's exposure and mitigate transmission in our community.  Due to his co-morbid illnesses, this patient is at least at moderate risk for complications without adequate follow up.  This format is felt to be most appropriate for this patient at this time.  All issues noted in this document were discussed and addressed.  A limited physical exam was performed with this format.  Please refer to the patient's chart for his consent to telehealth for [facility].   Date:  11/22/2018   ID:  Frank Love, DOB 02-11-1955, MRN [id number]  Patient Location: Home Provider Location: Home  PCP:  [name], NP  Cardiologist:  [name], MD  Electrophysiologist:  None   Evaluation Performed:  Follow-Up Visit  Chief Complaint:  Follow up Cardiac CTA  History of Present Illness:    Frank Love is a 63 y.o. male we are following for ongoing assessment and management of lower extremity edema and elevated BNP.  He was seen initially by Dr. Percival Spanish on 10/29/2018.  The patient was seen for preoperative evaluation for circumcision.  The patient normally gets around walking with a cane but denies any new symptoms of chest discomfort or dyspnea on exertion.  The patient did have an echocardiogram which was completed on 10/02/2018 revealing severely reduced ejection fraction of 30% to 35% with moderately dilated cavity size.  The left atrial size was severely dilated.  The patient was started on metoprolol 25 mg twice daily, Entresto 24 mg x 26 mg twice daily, aspirin 81 mg daily, and was to use furosemide 40 mg as needed.  The patient was scheduled for coronary CTA to evaluate for coronary artery disease in the setting of chronic systolic heart failure.  CTA was completed on 11/20/2018 and was  found to be abnormal revealing 3 vessel disease, with occluded LAD, Severe RCA stenosis, and moderate stenosis of the OM1. Calcium score of 3318.   Today on virtual video visit, the patient is without complaints.  He denies chest pressure, shortness of breath, fatigue, but continues to have right hip pain and is anxious to have surgery for repair.  The patient does not have symptoms concerning for COVID-19 infection (fever, chills, cough, or new shortness of breath).    Past Medical History:  Diagnosis Date  . Chronic pain of right knee   . Hypertension   . Phimosis    Past Surgical History:  Procedure Laterality Date  . CIRCUMCISION N/A 10/12/2018   Procedure: CIRCUMCISION ADULT;  Surgeon: Franchot Gallo, MD;  Location: [facility];  Service: Urology;  Laterality: N/A;  45 mins  . NO PAST SURGERIES       Current Meds  Medication Sig  . furosemide (LASIX) 40 MG tablet Take 1 tablet (40 mg total) by mouth daily.  . metoprolol tartrate (LOPRESSOR) 25 MG tablet Take 1 tablet (25 mg total) by mouth 2 (two) times daily.  . sacubitril-valsartan (ENTRESTO) 24-26 MG Take 1 tablet by mouth 2 (two) times daily.     Allergies:   Patient has no known allergies.   Social History   Tobacco Use  . Smoking status: Never Smoker  . Smokeless tobacco: Never Used  Substance Use Topics  . Alcohol use: No    Alcohol/week: 0.0 standard drinks  . Drug use: No  Family Hx: The patient's family history includes Diabetes in his mother; Heart disease in his mother.  ROS:   Please see the history of present illness.    All other systems reviewed and are negative.   Prior CV studies:   The following studies were reviewed today: Coronary CTA 11/20/2018 IMPRESSION: 1. Coronary calcium score of 3318. This was 33 percentile for age and sex matched control.  2. Normal coronary origin with right dominance.  3. Severe 3 vessel CAD with occluded LAD, severe RCA stenosis and moderate stenosis  OM1; CADRADS-4b.  4. Recommend cardiac catheterization.  Kirk Ruths   Echocardiogram 10/02/2018 1. 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 The cavity size was moderately dilated. There is moderately increased left ventricular wall thickness. Left ventricular diastolic Doppler  parameters are consistent with pseudonormalization.  2. The right ventricle has mildly reduced systolic function. The cavity was mildly enlarged. There is no increase in right ventricular wall thickness.  3. Left atrial size was severely dilated.  4. The mitral valve is grossly normal.  5. The tricuspid valve is grossly normal.  6. The aorta is normal unless otherwise noted.  7. The aortic root and ascending aorta are normal in size and structure.  8. The atrial septum is grossly normal.  Labs/Other Tests and Data Reviewed:    EKG:  No ECG reviewed.  Recent Labs: 07/09/2018: Hemoglobin 14.6; Platelets 168 08/03/2018: ALT 35; BNP 316.2 11/13/2018: BUN 24; Creatinine, Ser 1.05; Potassium 4.2; Sodium 142   Recent Lipid Panel Lab Results  Component Value Date/Time   CHOL 182 08/03/2018 10:24 AM   TRIG 64 08/03/2018 10:24 AM   HDL 53 08/03/2018 10:24 AM   CHOLHDL 3.4 08/03/2018 10:24 AM   LDLCALC 116 (H) 08/03/2018 10:24 AM    Wt Readings from Last 3 Encounters:  11/22/18 231 lb (104.8 kg)  10/29/18 231 lb 12.8 oz (105.1 kg)  10/16/18 221 lb (100.2 kg)     Objective:    Vital Signs:  Ht 6\' 3"  (1.905 m)   Wt 231 lb (104.8 kg)   BMI 28.87 kg/m    VITAL SIGNS:  reviewed GEN:  no acute distress EYES:  sclerae anicteric, EOMI - Extraocular Movements Intact RESPIRATORY:  normal respiratory effort, symmetric expansion NEURO:  alert and oriented x 3, no obvious focal deficit PSYCH:  normal affect  ASSESSMENT & PLAN:    1.  Coronary artery disease: Status post coronary artery CTA found to be abnormal, with calcium score of 3318,  severe three-vessel CAD with occluded LAD, severe RCA stenosis, moderate stenosis of the OM1.  He was recommended for cardiac catheterization by Dr. Stanford Breed who read the study.  I have explained the results of the cardiac CTA to the patient, discussed the need to proceed with cardiac catheterization.  I have explained the procedure.The patient understands that risks include but are not limited to stroke (1 in 1000), death (1 in 70), kidney failure [usually temporary] (1 in 500), bleeding (1 in 200), allergic reaction [possibly serious] (1 in 200), and agrees to proceed.   He is scheduled for November 27, 2018 at 7:30 AM with Dr. Daneen Schick III.  I have explained to him that he will need to stay in the hospital overnight if he does have an intervention but does have the possibility of needing bypass grafting depending upon results of the catheterization.  He is also aware that he will not have any visitors with him  during his catheterization but Dr. Tamala Julian will contact his wife and give her the results after catheterization is completed.  I have also sent a message to Dr. Percival Spanish, his primary cardiologist to inform him that he has been scheduled for catheterization.  I have also ordered nitroglycerin 0.4 mg sublingual for him to take as needed for any discomfort in his chest or significant shortness of breath.  2.  Systolic dysfunction: Abnormal echocardiogram revealing ejection fraction of 30% to 35% dated 10/02/2018.  The patient is currently on Entresto, metoprolol, and aspirin.  He had been taking Lasix in the past but no longer has any lower extremity edema or dyspnea on exertion.  Medication management and adjustment per Dr. Tamala Julian post procedure.    COVID-19 Education: The signs and symptoms of COVID-19 were discussed with the patient and how to seek care for testing (follow up with PCP or arrange E-visit).  1. The importance of social distancing was discussed today.  Time:   Today, I have  spent 40 minutes with the patient with telehealth technology discussing the above problems.     Medication Adjustments/Labs and Tests Ordered: Current medicines are reviewed at length with the patient today.  Concerns regarding medicines are outlined above.   Tests Ordered: Left heart catheterization with possible coronary intervention scheduled for November 27, 2018 at 7:30 AM, labs are ordered.  Medication Changes: Nitroglycerin 0.4 mg sublingual as needed  Disposition:  Follow up Post procedure  Signed, Phill Myron. [place] Pugh, ANP, AACC  11/22/2018 10:28 AM    [facility]

## 2018-11-22 NOTE — Telephone Encounter (Signed)
Please reschedule Frank Love and let me know so I can change the  Orders for the cath.

## 2018-11-22 NOTE — Patient Instructions (Addendum)
Medication Instructions:  Continue current medications  *If you need a refill on your cardiac medications before your next appointment, please call your pharmacy*  Lab Work: CBC and BMP  If you have labs (blood work) drawn today and your tests are completely normal, you will receive your results only by: [place] MyChart Message (if you have MyChart) OR . A paper copy in the mail If you have any lab test that is abnormal or we need to change your treatment, we will call you to review the results.  Testing/Procedures: Your physician has requested that you have a cardiac catheterization. Cardiac catheterization is used to diagnose and/or treat various heart conditions. Doctors may recommend this procedure for a number of different reasons. The most common reason is to evaluate chest pain. Chest pain can be a symptom of coronary artery disease (CAD), and cardiac catheterization can show whether plaque is narrowing or blocking your heart's arteries. This procedure is also used to evaluate the valves, as well as measure the blood flow and oxygen levels in different parts of your heart. For further information please visit [email or web address]. Please follow instruction sheet, as given.  Follow-Up: At [facility], you and your health needs are our priority.  As part of our continuing mission to provide you with exceptional heart care, we have created designated Provider Care Teams.  These Care Teams include your primary Cardiologist (physician) and Advanced Practice Providers (APPs -  Physician Assistants and Nurse Practitioners) who all work together to provide you with the care you need, when you need it.  Your next appointment:   2 weeks   Other Instructions    [place] [address] Dept: [phone] Loc: [phone]  Gad Emge Kenneth  11/22/2018  You are scheduled for a  Cardiac Catheterization on Tuesday, November 3 with Dr. Daneen Schick.  1. Please arrive at the [facility] (Main Entrance A) at [facility]: [address] [place], [place] [address] at 5:30 AM (This time is two hours before your procedure to ensure your preparation). Free valet parking service is available.   Special note: Every effort is made to have your procedure done on time. Please understand that emergencies sometimes delay scheduled procedures.  2. Diet: Do not eat solid foods after midnight.  The patient may have clear liquids until 5am upon the day of the procedure.  3. Labs: You will need to have blood drawn on Friday, October 30 at [place]  Open: [place] (Lunch 12:30 - 1:30)   Phone: [phone]. You do not need to be fasting.  [id number] Test: This is a Drive Up Visit at the [facility] [address], [place]. Someone will direct you to the appropriate testing line. Stay in your car and someone will be with you shortly.   4. Medication instructions in preparation for your procedure:   Contrast Allergy: No   Stop taking, Lasix (Furosemide)  Tuesday, November 3,   On the morning of your procedure, take your Aspirin and any morning medicines NOT listed above.  You may use sips of water.  5. Plan for one night stay--bring personal belongings. 6. Bring a current list of your medications and current insurance cards. 7. You MUST have a responsible person to drive you home. 8. Someone MUST be with you the first 24 hours after you arrive home or your discharge will be  delayed. 9. Please wear clothes that are easy to get on and off and wear slip-on shoes.  Thank you for allowing [place] to care for you!   -- [place] [facility]

## 2018-11-22 NOTE — Telephone Encounter (Signed)
New message   Patient wants to know if cath procedure can be rescheduled due to a scheduling conflict. Please advise.

## 2018-11-22 NOTE — Progress Notes (Signed)
Virtual Visit via Video Note   This visit type was conducted due to national recommendations for restrictions regarding the COVID-19 Pandemic (e.g. social distancing) in an effort to limit this patient's exposure and mitigate transmission in our community.  Due to his co-morbid illnesses, this patient is at least at moderate risk for complications without adequate follow up.  This format is felt to be most appropriate for this patient at this time.  All issues noted in this document were discussed and addressed.  A limited physical exam was performed with this format.  Please refer to the patient's chart for his consent to telehealth for [facility].   Date:  11/22/2018   ID:  Frank Love, DOB Mar 17, 1955, MRN [id number]  Patient Location: Home Provider Location: Home  PCP:  [name], NP  Cardiologist:  [name], MD  Electrophysiologist:  None   Evaluation Performed:  Follow-Up Visit  Chief Complaint:  Follow up Cardiac CTA  History of Present Illness:    Frank Love is a 63 y.o. male we are following for ongoing assessment and management of lower extremity edema and elevated BNP.  He was seen initially by Dr. Percival Spanish on 10/29/2018.  The patient was seen for preoperative evaluation for circumcision.  The patient normally gets around walking with a cane but denies any new symptoms of chest discomfort or dyspnea on exertion.  The patient did have an echocardiogram which was completed on 10/02/2018 revealing severely reduced ejection fraction of 30% to 35% with moderately dilated cavity size.  The left atrial size was severely dilated.  The patient was started on metoprolol 25 mg twice daily, Entresto 24 mg x 26 mg twice daily, aspirin 81 mg daily, and was to use furosemide 40 mg as needed.  The patient was scheduled for coronary CTA to evaluate for coronary artery disease in the setting of chronic systolic heart failure.  CTA was completed on 11/20/2018 and was  found to be abnormal revealing 3 vessel disease, with occluded LAD, Severe RCA stenosis, and moderate stenosis of the OM1. Calcium score of 3318.   Today on virtual video visit, the patient is without complaints.  He denies chest pressure, shortness of breath, fatigue, but continues to have right hip pain and is anxious to have surgery for repair.  The patient does not have symptoms concerning for COVID-19 infection (fever, chills, cough, or new shortness of breath).    Past Medical History:  Diagnosis Date  . Chronic pain of right knee   . Hypertension   . Phimosis    Past Surgical History:  Procedure Laterality Date  . CIRCUMCISION N/A 10/12/2018   Procedure: CIRCUMCISION ADULT;  Surgeon: Franchot Gallo, MD;  Location: [facility];  Service: Urology;  Laterality: N/A;  45 mins  . NO PAST SURGERIES       Current Meds  Medication Sig  . furosemide (LASIX) 40 MG tablet Take 1 tablet (40 mg total) by mouth daily.  . metoprolol tartrate (LOPRESSOR) 25 MG tablet Take 1 tablet (25 mg total) by mouth 2 (two) times daily.  . sacubitril-valsartan (ENTRESTO) 24-26 MG Take 1 tablet by mouth 2 (two) times daily.     Allergies:   Patient has no known allergies.   Social History   Tobacco Use  . Smoking status: Never Smoker  . Smokeless tobacco: Never Used  Substance Use Topics  . Alcohol use: No    Alcohol/week: 0.0 standard drinks  . Drug use: No  Family Hx: The patient's family history includes Diabetes in his mother; Heart disease in his mother.  ROS:   Please see the history of present illness.    All other systems reviewed and are negative.   Prior CV studies:   The following studies were reviewed today: Coronary CTA 11/20/2018 IMPRESSION: 1. Coronary calcium score of 3318. This was 34 percentile for age and sex matched control.  2. Normal coronary origin with right dominance.  3. Severe 3 vessel CAD with occluded LAD, severe RCA stenosis and moderate stenosis  OM1; CADRADS-4b.  4. Recommend cardiac catheterization.  Kirk Ruths   Echocardiogram 10/02/2018 1. 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 The cavity size was moderately dilated. There is moderately increased left ventricular wall thickness. Left ventricular diastolic Doppler  parameters are consistent with pseudonormalization.  2. The right ventricle has mildly reduced systolic function. The cavity was mildly enlarged. There is no increase in right ventricular wall thickness.  3. Left atrial size was severely dilated.  4. The mitral valve is grossly normal.  5. The tricuspid valve is grossly normal.  6. The aorta is normal unless otherwise noted.  7. The aortic root and ascending aorta are normal in size and structure.  8. The atrial septum is grossly normal.  Labs/Other Tests and Data Reviewed:    EKG:  No ECG reviewed.  Recent Labs: 07/09/2018: Hemoglobin 14.6; Platelets 168 08/03/2018: ALT 35; BNP 316.2 11/13/2018: BUN 24; Creatinine, Ser 1.05; Potassium 4.2; Sodium 142   Recent Lipid Panel Lab Results  Component Value Date/Time   CHOL 182 08/03/2018 10:24 AM   TRIG 64 08/03/2018 10:24 AM   HDL 53 08/03/2018 10:24 AM   CHOLHDL 3.4 08/03/2018 10:24 AM   LDLCALC 116 (H) 08/03/2018 10:24 AM    Wt Readings from Last 3 Encounters:  11/22/18 231 lb (104.8 kg)  10/29/18 231 lb 12.8 oz (105.1 kg)  10/16/18 221 lb (100.2 kg)     Objective:    Vital Signs:  Ht 6\' 3"  (1.905 m)   Wt 231 lb (104.8 kg)   BMI 28.87 kg/m    VITAL SIGNS:  reviewed GEN:  no acute distress EYES:  sclerae anicteric, EOMI - Extraocular Movements Intact RESPIRATORY:  normal respiratory effort, symmetric expansion NEURO:  alert and oriented x 3, no obvious focal deficit PSYCH:  normal affect  ASSESSMENT & PLAN:    1.  Coronary artery disease: Status post coronary artery CTA found to be abnormal, with calcium score of 3318,  severe three-vessel CAD with occluded LAD, severe RCA stenosis, moderate stenosis of the OM1.  He was recommended for cardiac catheterization by Dr. Stanford Breed who read the study.  I have explained the results of the cardiac CTA to the patient, discussed the need to proceed with cardiac catheterization.  I have explained the procedure.The patient understands that risks include but are not limited to stroke (1 in 1000), death (1 in 31), kidney failure [usually temporary] (1 in 500), bleeding (1 in 200), allergic reaction [possibly serious] (1 in 200), and agrees to proceed.   He is scheduled for November 27, 2018 at 7:30 AM with Dr. Daneen Schick III.  I have explained to him that he will need to stay in the hospital overnight if he does have an intervention but does have the possibility of needing bypass grafting depending upon results of the catheterization.  He is also aware that he will not have any visitors with him  during his catheterization but Dr. Tamala Julian will contact his wife and give her the results after catheterization is completed.  I have also sent a message to Dr. Percival Spanish, his primary cardiologist to inform him that he has been scheduled for catheterization.  I have also ordered nitroglycerin 0.4 mg sublingual for him to take as needed for any discomfort in his chest or significant shortness of breath.  2.  Systolic dysfunction: Abnormal echocardiogram revealing ejection fraction of 30% to 35% dated 10/02/2018.  The patient is currently on Entresto, metoprolol, and aspirin.  He had been taking Lasix in the past but no longer has any lower extremity edema or dyspnea on exertion.  Medication management and adjustment per Dr. Tamala Julian post procedure.    COVID-19 Education: The signs and symptoms of COVID-19 were discussed with the patient and how to seek care for testing (follow up with PCP or arrange E-visit).  1. The importance of social distancing was discussed today.  Time:   Today, I have  spent 40 minutes with the patient with telehealth technology discussing the above problems.     Medication Adjustments/Labs and Tests Ordered: Current medicines are reviewed at length with the patient today.  Concerns regarding medicines are outlined above.   Tests Ordered: Left heart catheterization with possible coronary intervention scheduled for November 27, 2018 at 7:30 AM, labs are ordered.  Medication Changes: Nitroglycerin 0.4 mg sublingual as needed  Disposition:  Follow up Post procedure  Signed, Phill Myron. West Pugh, ANP, AACC  11/22/2018 10:28 AM    [facility]

## 2018-11-22 NOTE — Telephone Encounter (Signed)
Returned the call to the patient. He stated that he needs to reschedule the cardiac cath due to having an appointment that day with his urologist. The appointment is needed according to the patient and cannot be rescheduled. He wants to know if he can do Wednesday or Thursday.

## 2018-11-22 NOTE — Telephone Encounter (Signed)
Pt cath change to Wednesday November 4th with Dr Martinique at 10:00 am, pt is aware to be there at 8:00, pt will get blood work and covid test prior to cath

## 2018-11-23 ENCOUNTER — Other Ambulatory Visit (HOSPITAL_COMMUNITY)
Admission: RE | Admit: 2018-11-23 | Discharge: 2018-11-23 | Disposition: A | Discharge status: Home / Self Care | Payer: No Typology Code available for payment source | Source: Ambulatory Visit | Attending: Cardiology | Admitting: Cardiology

## 2018-11-23 DIAGNOSIS — Z01812 Encounter for preprocedural laboratory examination: Secondary | ICD-10-CM | POA: Insufficient documentation

## 2018-11-23 DIAGNOSIS — Z20828 Contact with and (suspected) exposure to other viral communicable diseases: Secondary | ICD-10-CM | POA: Insufficient documentation

## 2018-11-26 LAB — NOVEL CORONAVIRUS, NAA (HOSP ORDER, SEND-OUT TO REF LAB; TAT 18-24 HRS): SARS-CoV-2, NAA: NOT DETECTED

## 2018-11-27 ENCOUNTER — Ambulatory Visit (INDEPENDENT_AMBULATORY_CARE_PROVIDER_SITE_OTHER): Payer: No Typology Code available for payment source | Admitting: Urology

## 2018-11-27 ENCOUNTER — Telehealth: Payer: Self-pay | Admitting: *Deleted

## 2018-11-27 DIAGNOSIS — N471 Phimosis: Secondary | ICD-10-CM

## 2018-11-27 DIAGNOSIS — C609 Malignant neoplasm of penis, unspecified: Secondary | ICD-10-CM

## 2018-11-27 NOTE — Telephone Encounter (Signed)
Pt contacted pre-catheterization scheduled at [facility] for: Wednesday November 28, 2018 10 AM Verified arrival time and place: [facility] at: 8 AM   No solid food after midnight prior to cath, clear liquids until 5 AM day of procedure. Contrast allergy: no  AM meds can be  taken pre-cath with sip of water including: ASA 81 mg   Confirmed patient has responsible adult to drive home post procedure and observe 24 hours after arriving home: yes  Currently, due to Covid-19 pandemic, only one support person will be allowed with patient. Must be the same support person for that patient's entire stay, will be screened and required to wear a mask. They will be asked to wait in the waiting room for the duration of the patient's stay.  Patients are required to wear a mask when they enter the hospital.      COVID-19 Pre-Screening Questions:  . In the past 7 to 10 days have you had a cough,  shortness of breath, headache, congestion, fever (100 or greater) body aches, chills, sore throat, or sudden loss of taste or sense of smell? no . Have you been around anyone with known Covid 19? no . Have you been around anyone who is awaiting Covid 19 test results in the past 7 to 10 days? no . Have you been around anyone who has been exposed to Covid 19, or has mentioned symptoms of Covid 19 within the past 7 to 10 days? no   I reviewed procedure/mask/visitor instructions, Covid-19 screening questions with patient, he verbalized understanding, thanked me for call.

## 2018-11-28 ENCOUNTER — Encounter (HOSPITAL_COMMUNITY)
Admission: RE | Disposition: A | Discharge status: Home / Self Care | Payer: Self-pay | Source: Home / Self Care | Attending: Cardiovascular Disease

## 2018-11-28 ENCOUNTER — Ambulatory Visit (HOSPITAL_COMMUNITY)
Admission: RE | Admit: 2018-11-28 | Discharge: 2018-11-28 | Disposition: A | Discharge status: Home / Self Care | Payer: No Typology Code available for payment source | Attending: Cardiovascular Disease | Admitting: Cardiovascular Disease

## 2018-11-28 ENCOUNTER — Other Ambulatory Visit: Payer: Self-pay

## 2018-11-28 ENCOUNTER — Encounter (HOSPITAL_COMMUNITY): Payer: Self-pay | Admitting: *Deleted

## 2018-11-28 DIAGNOSIS — I2510 Atherosclerotic heart disease of native coronary artery without angina pectoris: Secondary | ICD-10-CM

## 2018-11-28 DIAGNOSIS — Z8249 Family history of ischemic heart disease and other diseases of the circulatory system: Secondary | ICD-10-CM | POA: Insufficient documentation

## 2018-11-28 DIAGNOSIS — I255 Ischemic cardiomyopathy: Secondary | ICD-10-CM

## 2018-11-28 DIAGNOSIS — I10 Essential (primary) hypertension: Secondary | ICD-10-CM | POA: Insufficient documentation

## 2018-11-28 DIAGNOSIS — Z79899 Other long term (current) drug therapy: Secondary | ICD-10-CM | POA: Insufficient documentation

## 2018-11-28 HISTORY — PX: LEFT HEART CATH AND CORONARY ANGIOGRAPHY: CATH118249

## 2018-11-28 SURGERY — LEFT HEART CATH AND CORONARY ANGIOGRAPHY
Anesthesia: LOCAL

## 2018-11-28 MED ORDER — HEPARIN (PORCINE) IN NACL 1000-0.9 UT/500ML-% IV SOLN
INTRAVENOUS | Status: AC
  Filled 2018-11-28: qty 1000

## 2018-11-28 MED ORDER — HEPARIN (PORCINE) IN NACL 1000-0.9 UT/500ML-% IV SOLN
INTRAVENOUS | Status: AC
  Filled 2018-11-28: qty 500

## 2018-11-28 MED ORDER — SODIUM CHLORIDE 0.9% FLUSH: 3.0000 mL | INTRAVENOUS | Status: DC | PRN

## 2018-11-28 MED ORDER — SODIUM CHLORIDE 0.9 % WEIGHT BASED INFUSION
3.0000 mL/kg/h | INTRAVENOUS | Status: AC
  Administered 2018-11-28: 09:00:00 3 mL/kg/h via INTRAVENOUS

## 2018-11-28 MED ORDER — VERAPAMIL HCL 2.5 MG/ML IV SOLN
INTRAVENOUS | Status: DC | PRN
  Administered 2018-11-28: 10:00:00 10 mL via INTRA_ARTERIAL

## 2018-11-28 MED ORDER — LIDOCAINE HCL (PF) 1 % IJ SOLN
INTRAMUSCULAR | Status: AC
  Filled 2018-11-28: qty 30

## 2018-11-28 MED ORDER — SODIUM CHLORIDE 0.9% FLUSH: 3.0000 mL | Freq: Two times a day (BID) | INTRAVENOUS | Status: DC

## 2018-11-28 MED ORDER — FENTANYL CITRATE (PF) 100 MCG/2ML IJ SOLN
INTRAMUSCULAR | Status: DC | PRN
  Administered 2018-11-28: 25 ug via INTRAVENOUS

## 2018-11-28 MED ORDER — FENTANYL CITRATE (PF) 100 MCG/2ML IJ SOLN
INTRAMUSCULAR | Status: AC
  Filled 2018-11-28: qty 2

## 2018-11-28 MED ORDER — HEPARIN SODIUM (PORCINE) 1000 UNIT/ML IJ SOLN
INTRAMUSCULAR | Status: DC | PRN
  Administered 2018-11-28: 5000 [IU] via INTRAVENOUS

## 2018-11-28 MED ORDER — SODIUM CHLORIDE 0.9 % WEIGHT BASED INFUSION: 1.0000 mL/kg/h | INTRAVENOUS | Status: DC

## 2018-11-28 MED ORDER — SODIUM CHLORIDE 0.9 % IV SOLN: 250.0000 mL | INTRAVENOUS | Status: DC | PRN

## 2018-11-28 MED ORDER — MIDAZOLAM HCL 2 MG/2ML IJ SOLN
INTRAMUSCULAR | Status: DC | PRN
  Administered 2018-11-28: 1 mg via INTRAVENOUS

## 2018-11-28 MED ORDER — HEPARIN (PORCINE) IN NACL 1000-0.9 UT/500ML-% IV SOLN
INTRAVENOUS | Status: DC | PRN
  Administered 2018-11-28 (×3): 500 mL

## 2018-11-28 MED ORDER — VERAPAMIL HCL 2.5 MG/ML IV SOLN
INTRAVENOUS | Status: AC
  Filled 2018-11-28: qty 2

## 2018-11-28 MED ORDER — LIDOCAINE HCL (PF) 1 % IJ SOLN
INTRAMUSCULAR | Status: DC | PRN
  Administered 2018-11-28: 2 mL

## 2018-11-28 MED ORDER — MIDAZOLAM HCL 2 MG/2ML IJ SOLN
INTRAMUSCULAR | Status: AC
  Filled 2018-11-28: qty 2

## 2018-11-28 MED ORDER — IOHEXOL 350 MG/ML SOLN
INTRAVENOUS | Status: DC | PRN
  Administered 2018-11-28: 80 mL

## 2018-11-28 MED ORDER — HEPARIN SODIUM (PORCINE) 1000 UNIT/ML IJ SOLN
INTRAMUSCULAR | Status: AC
  Filled 2018-11-28: qty 1

## 2018-11-28 SURGICAL SUPPLY — 9 items

## 2018-11-28 NOTE — Interval H&P Note (Signed)
History and Physical Interval Note:  11/28/2018 8:47 AM  Frank Love  has presented today for cardiac cath with the diagnosis of CAD, ischemic cardiomyopathy. The various methods of treatment have been discussed with the patient and family. After consideration of risks, benefits and other options for treatment, the patient has consented to  Procedure(s): LEFT HEART CATH AND CORONARY ANGIOGRAPHY (N/A) as a surgical intervention.  The patient's history has been reviewed, patient examined, no change in status, stable for surgery.  I have reviewed the patient's chart and labs.  Questions were answered to the patient's satisfaction.    Cath Lab Visit (complete for each Cath Lab visit)  Clinical Evaluation Leading to the Procedure:   ACS: No.  Non-ACS:    Anginal Classification: CCS I  Anti-ischemic medical therapy: Minimal Therapy (1 class of medications)  Non-Invasive Test Results: High risk, findings on coronary CTA  Prior CABG: No previous CABG         Lauree Chandler

## 2018-11-28 NOTE — Discharge Instructions (Signed)
Radial Site Care ° °This sheet gives you information about how to care for yourself after your procedure. Your health care provider may also give you more specific instructions. If you have problems or questions, contact your health care provider. °What can I expect after the procedure? °After the procedure, it is common to have: °· Bruising and tenderness at the catheter insertion area. °Follow these instructions at home: °Medicines °· Take over-the-counter and prescription medicines only as told by your health care provider. °Insertion site care °· Follow instructions from your health care provider about how to take care of your insertion site. Make sure you: °? Wash your hands with soap and water before you change your bandage (dressing). If soap and water are not available, use hand sanitizer. °? Change your dressing as told by your health care provider. °? Leave stitches (sutures), skin glue, or adhesive strips in place. These skin closures may need to stay in place for 2 weeks or longer. If adhesive strip edges start to loosen and curl up, you may trim the loose edges. Do not remove adhesive strips completely unless your health care provider tells you to do that. °· Check your insertion site every day for signs of infection. Check for: °? Redness, swelling, or pain. °? Fluid or blood. °? Pus or a bad smell. °? Warmth. °· Do not take baths, swim, or use a hot tub until your health care provider approves. °· You may shower 24-48 hours after the procedure, or as directed by your health care provider. °? Remove the dressing and gently wash the site with plain soap and water. °? Pat the area dry with a clean towel. °? Do not rub the site. That could cause bleeding. °· Do not apply powder or lotion to the site. °Activity ° °· For 24 hours after the procedure, or as directed by your health care provider: °? Do not flex or bend the affected arm. °? Do not push or pull heavy objects with the affected arm. °? Do not  drive yourself home from the hospital or clinic. You may drive 24 hours after the procedure unless your health care provider tells you not to. °? Do not operate machinery or power tools. °· Do not lift anything that is heavier than 10 lb (4.5 kg), or the limit that you are told, until your health care provider says that it is safe. °· Ask your health care provider when it is okay to: °? Return to work or school. °? Resume usual physical activities or sports. °? Resume sexual activity. °General instructions °· If the catheter site starts to bleed, raise your arm and put firm pressure on the site. If the bleeding does not stop, get help right away. This is a medical emergency. °· If you went home on the same day as your procedure, a responsible adult should be with you for the first 24 hours after you arrive home. °· Keep all follow-up visits as told by your health care provider. This is important. °Contact a health care provider if: °· You have a fever. °· You have redness, swelling, or yellow drainage around your insertion site. °Get help right away if: °· You have unusual pain at the radial site. °· The catheter insertion area swells very fast. °· The insertion area is bleeding, and the bleeding does not stop when you hold steady pressure on the area. °· Your arm or hand becomes pale, cool, tingly, or numb. °These symptoms may represent a serious problem   that is an emergency. Do not wait to see if the symptoms will go away. Get medical help right away. Call your local emergency services (911 in the [place] Do not drive yourself to the hospital. °Summary °· After the procedure, it is common to have bruising and tenderness at the site. °· Follow instructions from your health care provider about how to take care of your radial site wound. Check the wound every day for signs of infection. °· Do not lift anything that is heavier than 10 lb (4.5 kg), or the limit that you are told, until your health care provider says  that it is safe. °This information is not intended to replace advice given to you by your health care provider. Make sure you discuss any questions you have with your health care provider. °Document Released: 02/12/2010 Document Revised: 02/15/2017 Document Reviewed: 02/15/2017 °Elsevier Patient Education © 2020 [employer] ° °

## 2018-11-30 ENCOUNTER — Telehealth: Payer: Self-pay

## 2018-11-30 DIAGNOSIS — I2510 Atherosclerotic heart disease of native coronary artery without angina pectoris: Secondary | ICD-10-CM

## 2018-11-30 NOTE — Telephone Encounter (Signed)
-----   Message from Burnell Blanks, MD sent at 11/30/2018  1:50 PM EST ----- Thanks Collier Salina and Maylon Cos. I just spoke to him. He is ok with referral for CABG. I am going to have [place] help me place the referral today.   [place], Can we place a referral to [facility] for CABG? I will include Levonne Spiller on this as well.   Gerald Stabs ----- Message ----- From: [name], MD Sent: 11/29/2018   1:30 PM EST To: Jettie Booze, MD, #  I reviewed the films.  I would favor consult with cardiothoracic.  Looks like the LAD could be grafted and the RCA clearly could.   ----- Message ----- From: Martinique, Peter M, MD Sent: 11/29/2018   6:58 AM EST To: [name], MD, Jettie Booze, MD, #  I would favor referral for CABG. He has multivessel occlusion. The RCA territory is quite large. With his LV dysfunction I think CABG would offer more complete revascularization.  Collier Salina ----- Message ----- From: Burnell Blanks, MD Sent: 11/28/2018  10:50 AM EST To: [name], MD, Jettie Booze, MD, #  Collier Salina and Ulice Dash, Can you take a look at the cath films on Mr. Parello. He is a 63 yo patient of Minerva Fester with new finding of cardiomyopathy and systolic CHF. He has CTO of the RCA and LAD on cath today. He is a stable outpatient with no chest pain. If you can give US guidance on the possibility of a CTO PCI of the LAD and RCA that would be helpful. If that is not an option, I will ask Maylon Cos to make an outpatient referral to CT surgery for CABG.   Thanks, Gerald Stabs

## 2018-11-30 NOTE — Telephone Encounter (Signed)
Referral placed to [facility] for CABG.

## 2018-11-30 NOTE — Progress Notes (Signed)
Referral placed to [facility] for CABG. Thanks

## 2018-12-03 ENCOUNTER — Other Ambulatory Visit: Payer: Self-pay | Admitting: Nurse Practitioner

## 2018-12-03 ENCOUNTER — Encounter: Payer: Self-pay | Admitting: Nurse Practitioner

## 2018-12-03 DIAGNOSIS — K089 Disorder of teeth and supporting structures, unspecified: Secondary | ICD-10-CM

## 2018-12-05 ENCOUNTER — Other Ambulatory Visit (HOSPITAL_COMMUNITY): Payer: Self-pay | Admitting: Urology

## 2018-12-05 ENCOUNTER — Other Ambulatory Visit: Payer: Self-pay | Admitting: Urology

## 2018-12-05 DIAGNOSIS — C609 Malignant neoplasm of penis, unspecified: Secondary | ICD-10-CM

## 2018-12-07 ENCOUNTER — Other Ambulatory Visit: Payer: Self-pay

## 2018-12-07 ENCOUNTER — Institutional Professional Consult (permissible substitution): Payer: No Typology Code available for payment source | Admitting: Cardiothoracic Surgery

## 2018-12-07 ENCOUNTER — Other Ambulatory Visit: Payer: Self-pay | Admitting: *Deleted

## 2018-12-07 VITALS — BP 141/57 | HR 69 | Temp 97.3°F | Resp 20 | Ht 75.0 in | Wt 229.4 lb

## 2018-12-07 DIAGNOSIS — I2510 Atherosclerotic heart disease of native coronary artery without angina pectoris: Secondary | ICD-10-CM

## 2018-12-07 NOTE — Progress Notes (Signed)
LEFT MESSAGE WITH SELITA, PATIENT NEEDS TO BE MOVED TO [place] MAIN OR DUE TO MULTI VESSEL OCCLUSION AND REFERRED TO [facility] FOR CABG.

## 2018-12-10 ENCOUNTER — Other Ambulatory Visit (HOSPITAL_COMMUNITY)
Admission: RE | Admit: 2018-12-10 | Discharge: 2018-12-10 | Disposition: A | Discharge status: Home / Self Care | Payer: Self-pay | Source: Ambulatory Visit | Attending: Cardiothoracic Surgery | Admitting: Cardiothoracic Surgery

## 2018-12-10 ENCOUNTER — Other Ambulatory Visit: Payer: Self-pay

## 2018-12-10 ENCOUNTER — Other Ambulatory Visit (HOSPITAL_COMMUNITY): Payer: No Typology Code available for payment source

## 2018-12-10 ENCOUNTER — Ambulatory Visit (HOSPITAL_COMMUNITY)
Admission: RE | Admit: 2018-12-10 | Discharge: 2018-12-10 | Disposition: A | Discharge status: Home / Self Care | Payer: No Typology Code available for payment source | Source: Ambulatory Visit | Attending: Cardiothoracic Surgery | Admitting: Cardiothoracic Surgery

## 2018-12-10 DIAGNOSIS — Z20828 Contact with and (suspected) exposure to other viral communicable diseases: Secondary | ICD-10-CM | POA: Insufficient documentation

## 2018-12-10 DIAGNOSIS — I6523 Occlusion and stenosis of bilateral carotid arteries: Secondary | ICD-10-CM | POA: Insufficient documentation

## 2018-12-10 DIAGNOSIS — Z01818 Encounter for other preprocedural examination: Secondary | ICD-10-CM | POA: Insufficient documentation

## 2018-12-10 DIAGNOSIS — I2510 Atherosclerotic heart disease of native coronary artery without angina pectoris: Secondary | ICD-10-CM | POA: Insufficient documentation

## 2018-12-10 LAB — SARS CORONAVIRUS 2 (TAT 6-24 HRS): SARS Coronavirus 2: NEGATIVE

## 2018-12-10 MED FILL — ENTRESTO 24 MG-26 MG TABLET: 24-26 | 30 days supply | Qty: 60 | Fill #1

## 2018-12-10 NOTE — Progress Notes (Signed)
[place] [place] [address] Phone: [phone] Fax: [phone]      Your procedure is scheduled on November 18  Report to [facility] Main Entrance "A" at [email or web address]., and check in at the Admitting office.  Call this number if you have problems the morning of surgery:  [phone]  Call [phone] if you have any questions prior to your surgery date Monday-Friday 8am-4pm    Remember:  Do not eat or drink after midnight the night before your surgery     Take these medicines the morning of surgery with A SIP OF WATER  metoprolol tartrate (LOPRESSOR)  Follow your surgeon's instructions on when to stop Aspirin.  If no instructions were given by your surgeon then you will need to call the office to get those instructions.    7 days prior to surgery STOP taking any Aspirin (unless otherwise instructed by your surgeon), Aleve, Naproxen, Ibuprofen, Motrin, Advil, Goody's, BC's, all herbal medications, fish oil, and all vitamins.    The Morning of Surgery  Do not wear jewelry  Do not wear lotions, powders, or colognes, or deodorant  Men may shave face and neck.  Do not bring valuables to the hospital.  [facility] is not [facility] belongings or valuables.  If you are a smoker, DO NOT Smoke 24 hours prior to surgery IF you wear a CPAP at night please bring your mask, tubing, and machine the morning of surgery   Remember that you must have someone to transport you home after your surgery, and remain with you for 24 hours if you are discharged the same day.   Contacts, glasses, hearing aids, dentures or bridgework may not be worn into surgery.    Leave your suitcase in the car.  After surgery it may be brought to your room.  For patients admitted to the hospital, discharge time will be determined by your treatment team.  Patients discharged the day of surgery will not be allowed to drive  home.    Special instructions:   [place]- Preparing For Surgery  Before surgery, you can play an important role. Because skin is not sterile, your skin needs to be as free of germs as possible. You can reduce the number of germs on your skin by washing with CHG (chlorahexidine gluconate) Soap before surgery.  CHG is an antiseptic cleaner which kills germs and bonds with the skin to continue killing germs even after washing.    Oral Hygiene is also important to reduce your risk of infection.  Remember - BRUSH YOUR TEETH THE MORNING OF SURGERY WITH YOUR REGULAR TOOTHPASTE  Please do not use if you have an allergy to CHG or antibacterial soaps. If your skin becomes reddened/irritated stop using the CHG.  Do not shave (including legs and underarms) for at least 48 hours prior to first CHG shower. It is OK to shave your face.  Please follow these instructions carefully.   1. Shower the NIGHT BEFORE SURGERY and the MORNING OF SURGERY with CHG Soap.   2. If you chose to wash your hair, wash your hair first as usual with your normal shampoo.  3. After you shampoo, rinse your hair and body thoroughly to remove the shampoo.  4. Use CHG as you would any other liquid soap. You can apply CHG directly to the skin and wash gently with a scrungie or a clean washcloth.  5. Apply the CHG Soap to your body ONLY FROM THE NECK DOWN.  Do not use on open wounds or open sores. Avoid contact with your eyes, ears, mouth and genitals (private parts). Wash Face and genitals (private parts)  with your normal soap.   6. Wash thoroughly, paying special attention to the area where your surgery will be performed.  7. Thoroughly rinse your body with warm water from the neck down.  8. DO NOT shower/wash with your normal soap after using and rinsing off the CHG Soap.  9. Pat yourself dry with a CLEAN TOWEL.  10. Wear CLEAN PAJAMAS to bed the night before surgery, wear comfortable clothes the morning of  surgery  11. Place CLEAN SHEETS on your bed the night of your first shower and DO NOT SLEEP WITH PETS.    Day of Surgery:  Do not apply any deodorants/lotions. Please shower the morning of surgery with the CHG soap  Please wear clean clothes to the hospital/surgery center.   Remember to brush your teeth WITH YOUR REGULAR TOOTHPASTE.   Please read over the following fact sheets that you were given.

## 2018-12-10 NOTE — Progress Notes (Signed)
Per Thurmond Butts RN with Dr. Orvan Seen office, patient should continue his aspirin but not take it the day of procedure

## 2018-12-11 ENCOUNTER — Encounter (HOSPITAL_COMMUNITY)
Admission: RE | Admit: 2018-12-11 | Discharge: 2018-12-11 | Disposition: A | Discharge status: Home / Self Care | Payer: No Typology Code available for payment source | Source: Ambulatory Visit | Attending: Cardiothoracic Surgery | Admitting: Cardiothoracic Surgery

## 2018-12-11 ENCOUNTER — Encounter (HOSPITAL_COMMUNITY): Payer: Self-pay

## 2018-12-11 ENCOUNTER — Ambulatory Visit (HOSPITAL_COMMUNITY): Payer: Self-pay

## 2018-12-11 ENCOUNTER — Other Ambulatory Visit: Payer: Self-pay

## 2018-12-11 ENCOUNTER — Ambulatory Visit (HOSPITAL_COMMUNITY)
Admission: RE | Admit: 2018-12-11 | Discharge: 2018-12-11 | Disposition: A | Discharge status: Home / Self Care | Payer: No Typology Code available for payment source | Source: Ambulatory Visit | Attending: Cardiothoracic Surgery | Admitting: Cardiothoracic Surgery

## 2018-12-11 DIAGNOSIS — I2510 Atherosclerotic heart disease of native coronary artery without angina pectoris: Secondary | ICD-10-CM

## 2018-12-11 HISTORY — DX: Atherosclerotic heart disease of native coronary artery without angina pectoris: I25.10

## 2018-12-11 LAB — BLOOD GAS, ARTERIAL
Acid-Base Excess: 1.7 mmol/L (ref 0.0–2.0)
Bicarbonate: 25.6 mmol/L (ref 20.0–28.0)
Drawn by: 42180
FIO2: 21
O2 Saturation: 97.5 %
Patient temperature: 37
pCO2 arterial: 39.5 mmHg (ref 32.0–48.0)
pH, Arterial: 7.428 (ref 7.350–7.450)
pO2, Arterial: 102 mmHg (ref 83.0–108.0)

## 2018-12-11 LAB — COMPREHENSIVE METABOLIC PANEL
ALT: 16 U/L (ref 0–44)
AST: 19 U/L (ref 15–41)
Albumin: 3.5 g/dL (ref 3.5–5.0)
Alkaline Phosphatase: 50 U/L (ref 38–126)
Anion gap: 9 (ref 5–15)
BUN: 22 mg/dL (ref 8–23)
CO2: 21 mmol/L — ABNORMAL LOW (ref 22–32)
Calcium: 8.8 mg/dL — ABNORMAL LOW (ref 8.9–10.3)
Chloride: 108 mmol/L (ref 98–111)
Creatinine, Ser: 0.93 mg/dL (ref 0.61–1.24)
GFR calc Af Amer: >60 mL/min (ref >60)
GFR calc non Af Amer: >60 mL/min (ref >60)
Glucose, Bld: 114 mg/dL — ABNORMAL HIGH (ref 70–99)
Potassium: 3.9 mmol/L (ref 3.5–5.1)
Sodium: 138 mmol/L (ref 135–145)
Total Bilirubin: 0.8 mg/dL (ref 0.3–1.2)
Total Protein: 6.7 g/dL (ref 6.5–8.1)

## 2018-12-11 LAB — PROTIME-INR
INR: 1 (ref 0.8–1.2)
Prothrombin Time: 13.4 seconds (ref 11.4–15.2)

## 2018-12-11 LAB — HEMOGLOBIN A1C
Hgb A1c MFr Bld: 5 % (ref 4.8–5.6)
Mean Plasma Glucose: 96.8 mg/dL

## 2018-12-11 LAB — URINALYSIS, ROUTINE W REFLEX MICROSCOPIC
Bacteria, UA: NONE SEEN
Bilirubin Urine: NEGATIVE
Glucose, UA: NEGATIVE mg/dL
Ketones, ur: NEGATIVE mg/dL
Leukocytes,Ua: NEGATIVE
Nitrite: NEGATIVE
Protein, ur: NEGATIVE mg/dL
Specific Gravity, Urine: 1.024 (ref 1.005–1.030)
pH: 5 (ref 5.0–8.0)

## 2018-12-11 LAB — CBC
HCT: 43.9 % (ref 39.0–52.0)
Hemoglobin: 14.4 g/dL (ref 13.0–17.0)
MCH: 30.3 pg (ref 26.0–34.0)
MCHC: 32.8 g/dL (ref 30.0–36.0)
MCV: 92.4 fL (ref 80.0–100.0)
Platelets: 158 10*3/uL (ref 150–400)
RBC: 4.75 MIL/uL (ref 4.22–5.81)
RDW: 12.3 % (ref 11.5–15.5)
WBC: 5.7 10*3/uL (ref 4.0–10.5)
nRBC: 0 % (ref 0.0–0.2)

## 2018-12-11 LAB — ABO/RH: ABO/RH(D): A POS

## 2018-12-11 LAB — SURGICAL PCR SCREEN
MRSA, PCR: NEGATIVE
Staphylococcus aureus: POSITIVE — AB

## 2018-12-11 LAB — APTT: aPTT: 27 seconds (ref 24–36)

## 2018-12-11 MED ORDER — NITROGLYCERIN IN D5W 200-5 MCG/ML-% IV SOLN
2.0000 ug/min | INTRAVENOUS | Status: AC
  Administered 2018-12-12: 16.6 ug/min via INTRAVENOUS
  Filled 2018-12-11: qty 250

## 2018-12-11 MED ORDER — POTASSIUM CHLORIDE 2 MEQ/ML IV SOLN
80.0000 meq | INTRAVENOUS | Status: DC
  Filled 2018-12-11: qty 40

## 2018-12-11 MED ORDER — DOPAMINE-DEXTROSE 3.2-5 MG/ML-% IV SOLN
0.0000 ug/kg/min | INTRAVENOUS | Status: DC
  Filled 2018-12-11: qty 250

## 2018-12-11 MED ORDER — TRANEXAMIC ACID (OHS) BOLUS VIA INFUSION
15.0000 mg/kg | INTRAVENOUS | Status: AC
  Administered 2018-12-12: 1581 mg via INTRAVENOUS
  Filled 2018-12-11: qty 1581

## 2018-12-11 MED ORDER — MILRINONE LACTATE IN DEXTROSE 20-5 MG/100ML-% IV SOLN
0.3000 ug/kg/min | INTRAVENOUS | Status: AC
  Administered 2018-12-12: .25 ug/kg/min via INTRAVENOUS
  Filled 2018-12-11: qty 100

## 2018-12-11 MED ORDER — SODIUM CHLORIDE 0.9 % IV SOLN
INTRAVENOUS | Status: DC
  Filled 2018-12-11: qty 30

## 2018-12-11 MED ORDER — TRANEXAMIC ACID (OHS) PUMP PRIME SOLUTION
2.0000 mg/kg | INTRAVENOUS | Status: DC
  Filled 2018-12-11: qty 2.11

## 2018-12-11 MED ORDER — PLASMA-LYTE 148 IV SOLN
INTRAVENOUS | Status: AC
  Administered 2018-12-12: 500 mL
  Filled 2018-12-11: qty 2.5

## 2018-12-11 MED ORDER — EPINEPHRINE HCL 5 MG/250ML IV SOLN IN NS
0.0000 ug/min | INTRAVENOUS | Status: AC
  Administered 2018-12-12: 2 ug/min via INTRAVENOUS
  Filled 2018-12-11: qty 250

## 2018-12-11 MED ORDER — INSULIN REGULAR(HUMAN) IN NACL 100-0.9 UT/100ML-% IV SOLN
INTRAVENOUS | Status: AC
  Administered 2018-12-12: 2.8 [IU]/h via INTRAVENOUS
  Filled 2018-12-11: qty 100

## 2018-12-11 MED ORDER — PHENYLEPHRINE HCL-NACL 20-0.9 MG/250ML-% IV SOLN
30.0000 ug/min | INTRAVENOUS | Status: AC
  Administered 2018-12-12: 50 ug/min via INTRAVENOUS
  Filled 2018-12-11: qty 250

## 2018-12-11 MED ORDER — NOREPINEPHRINE 4 MG/250ML-% IV SOLN
0.0000 ug/min | INTRAVENOUS | Status: DC
  Filled 2018-12-11: qty 250

## 2018-12-11 MED ORDER — MAGNESIUM SULFATE 50 % IJ SOLN
40.0000 meq | INTRAMUSCULAR | Status: DC
  Filled 2018-12-11: qty 9.85

## 2018-12-11 MED ORDER — VANCOMYCIN HCL 1000 MG IV SOLR
INTRAVENOUS | Status: AC
  Administered 2018-12-12: 1000 mL
  Filled 2018-12-11: qty 1000

## 2018-12-11 MED ORDER — DEXMEDETOMIDINE HCL IN NACL 400 MCG/100ML IV SOLN
0.1000 ug/kg/h | INTRAVENOUS | Status: DC
  Filled 2018-12-11: qty 100

## 2018-12-11 MED ORDER — SODIUM CHLORIDE 0.9 % IV SOLN
1.5000 g | INTRAVENOUS | Status: AC
  Administered 2018-12-12: 1.5 g via INTRAVENOUS
  Filled 2018-12-11: qty 1.5

## 2018-12-11 MED ORDER — VANCOMYCIN HCL 10 G IV SOLR
1500.0000 mg | INTRAVENOUS | Status: AC
  Administered 2018-12-12: 1500 mg via INTRAVENOUS
  Filled 2018-12-11: qty 1500

## 2018-12-11 MED ORDER — SODIUM CHLORIDE 0.9 % IV SOLN
750.0000 mg | INTRAVENOUS | Status: DC
  Filled 2018-12-11: qty 750

## 2018-12-11 MED ORDER — TRANEXAMIC ACID 1000 MG/10ML IV SOLN
1.5000 mg/kg/h | INTRAVENOUS | Status: DC
  Filled 2018-12-11: qty 25

## 2018-12-11 NOTE — Progress Notes (Signed)
[address]       [place] [address]             [facility] REPORT  Referring Provider is Burnell Blanks* Primary Cardiologist is [name], MD PCP is [name], NP  Chief Complaint  Patient presents with  . Coronary Artery Disease    Surgical consultation    HPI:  63 yo man was in his USOH until approximately 2 mos ago when he was noted to have increased, unexplained LE edema. Labs showed elevated BNP suggesting CHF. He has been medically managed with relief of sx. In the meantime, work-up of CHF by coronary calcium CT scan, echo, and left heart catheterization shows severe multivessel CAD and depressed LV function. He was otherwise scheduled to undergo hip replacement but this is now deferred. Denies angina or angina equivalent; denies orthopnea or cough.   Past Medical History:  Diagnosis Date  . Chronic pain of right knee   . Coronary artery disease   . Hypertension   . Myocardial infarction [facility])    possible in the past was told by a doctor but no one has ever said anything else  . Phimosis     Past Surgical History:  Procedure Laterality Date  . CIRCUMCISION N/A 10/12/2018   Procedure: CIRCUMCISION ADULT;  Surgeon: Franchot Gallo, MD;  Location: [facility];  Service: Urology;  Laterality: N/A;  45 mins  . COLONOSCOPY    . LEFT HEART CATH AND CORONARY ANGIOGRAPHY N/A 11/28/2018   Procedure: LEFT HEART CATH AND CORONARY ANGIOGRAPHY;  Surgeon: Burnell Blanks, MD;  Location: [facility];  Service: Cardiovascular;  Laterality: N/A;  . TOOTH EXTRACTION      Family History  Problem Relation Age of Onset  . Diabetes Mother   . Heart disease Mother     Social History   Socioeconomic History  . Marital status: Married    Spouse name: Not on file  . Number of children: Not on file  . Years of education: Not on file  . Highest education level: Not on file   Occupational History  . Not on file  Social Needs  . Financial resource strain: Not on file  . Food insecurity    Worry: Not on file    Inability: Not on file  . Transportation needs    Medical: Not on file    Non-medical: Not on file  Tobacco Use  . Smoking status: Never Smoker  . Smokeless tobacco: Never Used  Substance and Sexual Activity  . Alcohol use: No    Alcohol/week: 0.0 standard drinks  . Drug use: No  . Sexual activity: Yes  Lifestyle  . Physical activity    Days per week: Not on file    Minutes per session: Not on file  . Stress: Not on file  Relationships  . Social Herbalist on phone: Not on file    Gets together: Not on file    Attends religious service: Not on file    Active member of club or organization: Not on file    Attends meetings of clubs or organizations: Not on file    Relationship status: Not on file  . Intimate partner violence    Fear of current or ex partner: Not on file    Emotionally abused: Not on file    Physically abused: Not on file  Forced sexual activity: Not on file  Other Topics Concern  . Not on file  Social History Narrative   Lives with wife.      Current Outpatient Medications  Medication Sig Dispense Refill  . aspirin EC 81 MG tablet Take 81 mg by mouth at bedtime.     . metoprolol tartrate (LOPRESSOR) 25 MG tablet Take 1 tablet (25 mg total) by mouth 2 (two) times daily. 180 tablet 1  . nitroGLYCERIN (NITROSTAT) 0.4 MG SL tablet Place 1 tablet (0.4 mg total) under the tongue every 5 (five) minutes as needed for chest pain. 25 tablet 3  . sacubitril-valsartan (ENTRESTO) 24-26 MG Take 1 tablet by mouth 2 (two) times daily. 60 tablet 3   No current facility-administered medications for this visit.    Facility-Administered Medications Ordered in Other Visits  Medication Dose Route Frequency Provider Last Rate Last Dose  . [START ON 12/12/2018] cefUROXime (ZINACEF) 1.5 g in sodium chloride 0.9 % 100 mL IVPB   1.5 g Intravenous To OR Tinlee Navarrette Z, MD      . cefUROXime (ZINACEF) 750 mg in sodium chloride 0.9 % 100 mL IVPB  750 mg Intravenous To OR Starlett Pehrson, Glenice Bow, MD      . Derrill Memo ON 12/12/2018] dexmedetomidine (PRECEDEX) 400 MCG/100ML (4 mcg/mL) infusion  0.1-0.7 mcg/kg/hr Intravenous To OR Yezenia Fredrick, Glenice Bow, MD      . Derrill Memo ON 12/12/2018] DOPamine (INTROPIN) 800 mg in dextrose 5 % 250 mL (3.2 mg/mL) infusion  0-10 mcg/kg/min Intravenous To OR [name], MD      . Derrill Memo ON 12/12/2018] EPINEPHrine (ADRENALIN) 4 mg in NS 250 mL (0.016 mg/mL) premix infusion  0-10 mcg/min Intravenous To OR [name], MD      . Derrill Memo ON 12/12/2018] heparin 2,500 Units, papaverine 30 mg in electrolyte-148 (PLASMALYTE-148) 500 mL irrigation   Irrigation To OR Jamecia Lerman, Glenice Bow, MD      . Derrill Memo ON 12/12/2018] heparin 30,000 units/NS 1000 mL solution for CELLSAVER   Other To OR Lyllian Gause, Glenice Bow, MD      . Derrill Memo ON 12/12/2018] insulin regular, human (MYXREDLIN) 100 units/ 100 mL infusion   Intravenous To OR [name], MD      . Derrill Memo ON 12/12/2018] magnesium sulfate (IV Push/IM) injection 40 mEq  40 mEq Other To OR Christropher Gintz, Glenice Bow, MD      . Derrill Memo ON 12/12/2018] milrinone (PRIMACOR) 20 MG/100 ML (0.2 mg/mL) infusion  0.3 mcg/kg/min Intravenous To OR [name], MD      . Derrill Memo ON 12/12/2018] nitroGLYCERIN 50 mg in dextrose 5 % 250 mL (0.2 mg/mL) infusion  2-200 mcg/min Intravenous To OR [name], MD      . Derrill Memo ON 12/12/2018] norepinephrine (LEVOPHED) 4mg  in 235mL premix infusion  0-40 mcg/min Intravenous To OR [name], MD      . Derrill Memo ON 12/12/2018] phenylephrine (NEOSYNEPHRINE) 20-0.9 MG/250ML-% infusion  30-200 mcg/min Intravenous To OR [name], MD      . Derrill Memo ON 12/12/2018] potassium chloride injection 80 mEq  80 mEq Other To OR Uriah Trueba, Glenice Bow, MD      . Derrill Memo ON 12/12/2018] tranexamic acid (CYKLOKAPRON) 2,500 mg in sodium chloride 0.9 % 250 mL  (10 mg/mL) infusion  1.5 mg/kg/hr Intravenous To OR [name], MD      . Derrill Memo ON 12/12/2018] tranexamic acid (CYKLOKAPRON) bolus via infusion - over 30 minutes 1,581 mg  15 mg/kg Intravenous To OR  [name], MD      . Derrill Memo ON 12/12/2018] tranexamic acid (CYKLOKAPRON) pump prime solution 211 mg  2 mg/kg Intracatheter To OR Ileah Falkenstein, Glenice Bow, MD      . Derrill Memo ON 12/12/2018] vancomycin (VANCOCIN) 1,000 mg in sodium chloride 0.9 % 1,000 mL irrigation   Irrigation To OR Lance Huaracha, Glenice Bow, MD      . Derrill Memo ON 12/12/2018] vancomycin (VANCOCIN) 1,500 mg in sodium chloride 0.9 % 250 mL IVPB  1,500 mg Intravenous To OR Khalila Buechner, Glenice Bow, MD        No Known Allergies    Review of Systems:   General:  No change appetite, mildly reduced energy, + weight loss--intentional, denies fever  Cardiac:  As per HPI  Respiratory:  negative shortness of breath, negative productive cough,  GI:   Denies sx  GU:   H/o recent circumcision  Vascular:  no pain suggestive of claudication,  no varicose veins/ DVT, Neuro:   No stroke/ TIA's/ seizures/ headaches,  Musculoskeletal: + arthritis-- R hip and knee,  Skin:   neg  Psych:   neg  Eyes:   neg  ENT:   neg  Hematologic:  neg  Endocrine:  no diabetes, does not check CBG's at home     Physical Exam:   BP (!) 141/57 (BP Location: Right Arm)   Pulse 69   Temp (!) 97.3 F (36.3 C) (Skin)   Resp 20   Ht 6\' 3"  (1.905 m)   Wt 104.1 kg   SpO2 97% Comment: RA  BMI 28.67 kg/m   General:   well-appearing, NAD   HEENT:  Unremarkable   Neck:   no JVD, no bruits, no adenopathy   Chest:   clear to auscultation, symmetrical breath sounds, no wheezes, no rhonchi   CV:   RRR, no  murmur   Abdomen:  soft, non-tender, no masses   Extremities:  warm, well-perfused, pulses 2+ throughout, no LE edema  Rectal/GU  Deferred  Neuro:   Grossly non-focal and symmetrical throughout  Skin:   Clean and dry, no rashes, no breakdown   Diagnostic Tests:  I  have personally reviewed his CT scans, trans-thoracic echocardiogram, and Left heart catheterization and agree with the interpretations.   Impression:  63 yo man with reduced LV function and multivessel CAD. Suggest surgical revascularization.   Plan:  CABG on 12/12/18 Pre-op testing for risk stratification   I spent in excess of 30 minutes during the conduct of this office consultation and >50% of this time involved direct face-to-face encounter with the patient for counseling and/or coordination of their care.          Level 3 Office Consult = 40 minutes         Level 4 Office Consult = 60 minutes         Level 5 Office Consult = 80 minutes  B. Murvin Natal, MD 12/11/2018 2:24 PM

## 2018-12-11 NOTE — Progress Notes (Signed)
PCP - Archie Patten Cardiologist - [place]    Chest x-ray - 12/11/18 EKG - 11/28/18 Stress Test - denies ECHO - 10/02/18 Cardiac Cath - 11/28/18  Aspirin Instructions: continue but do not take the day of surgery   COVID TEST- negative on 11/16   Anesthesia review: yes, CAD  Patient denies shortness of breath, fever, cough and chest pain at PAT appointment   All instructions explained to the patient, with a verbal understanding of the material. Patient agrees to go over the instructions while at home for a better understanding. Patient also instructed to self quarantine after being tested for COVID-19. The opportunity to ask questions was provided.

## 2018-12-11 NOTE — Progress Notes (Addendum)
   12/11/18 1223  Complete When Order for Surgery Written:  Pre-op Surgical PCR Testing (All Areas )  Swab Result Positive? Staph Aureus  Patient Notified? Yes  Date Notified 12/11/18 (Prescription called in at [facility] and wellness [phone])   Patient called at 2:23 PM stating that the [facility] closed today at 1230 pm, patient stated that he was not given a reason for the early closure.  Patient wanted to check with other pharmacy's about the price of Mupirocin and see if it would be affordable for him to have it filled somewhere else.  The patient called back about 30 minutes later and stated that it was going to cost him somewhere between 24-54$ and that he would rather [place] give it to him the morning of surgery.  Patient stated that he received financial assistance through the community health and wellness and that he could not afford the prescription at this time.  I told the patient that it would be possible for [place] to give it to him the morning of his procedure.

## 2018-12-12 ENCOUNTER — Encounter (HOSPITAL_COMMUNITY): Payer: Self-pay | Admitting: *Deleted

## 2018-12-12 ENCOUNTER — Inpatient Hospital Stay (HOSPITAL_COMMUNITY): Payer: No Typology Code available for payment source

## 2018-12-12 ENCOUNTER — Inpatient Hospital Stay (HOSPITAL_COMMUNITY): Payer: No Typology Code available for payment source | Admitting: Physician Assistant

## 2018-12-12 ENCOUNTER — Inpatient Hospital Stay (HOSPITAL_COMMUNITY)
Admission: RE | Disposition: A | Discharge status: Rehabilitation Facility | Payer: Self-pay | Source: Home / Self Care | Attending: Cardiothoracic Surgery

## 2018-12-12 ENCOUNTER — Inpatient Hospital Stay (HOSPITAL_COMMUNITY)
Admission: RE | Admit: 2018-12-12 | Discharge: 2018-12-21 | DRG: 235 | Disposition: A | Discharge status: Rehabilitation Facility | Payer: No Typology Code available for payment source | Attending: Cardiothoracic Surgery | Admitting: Cardiothoracic Surgery

## 2018-12-12 ENCOUNTER — Inpatient Hospital Stay (HOSPITAL_COMMUNITY): Payer: No Typology Code available for payment source | Admitting: Anesthesiology

## 2018-12-12 ENCOUNTER — Encounter (HOSPITAL_COMMUNITY)
Admission: RE | Disposition: A | Discharge status: Rehabilitation Facility | Payer: Self-pay | Source: Home / Self Care | Attending: Cardiothoracic Surgery

## 2018-12-12 DIAGNOSIS — I5023 Acute on chronic systolic (congestive) heart failure: Secondary | ICD-10-CM | POA: Diagnosis not present

## 2018-12-12 DIAGNOSIS — I255 Ischemic cardiomyopathy: Secondary | ICD-10-CM | POA: Diagnosis present

## 2018-12-12 DIAGNOSIS — E876 Hypokalemia: Secondary | ICD-10-CM | POA: Diagnosis not present

## 2018-12-12 DIAGNOSIS — I5082 Biventricular heart failure: Secondary | ICD-10-CM | POA: Diagnosis present

## 2018-12-12 DIAGNOSIS — I9789 Other postprocedural complications and disorders of the circulatory system, not elsewhere classified: Secondary | ICD-10-CM

## 2018-12-12 DIAGNOSIS — D62 Acute posthemorrhagic anemia: Secondary | ICD-10-CM | POA: Diagnosis not present

## 2018-12-12 DIAGNOSIS — Y832 Surgical operation with anastomosis, bypass or graft as the cause of abnormal reaction of the patient, or of later complication, without mention of misadventure at the time of the procedure: Secondary | ICD-10-CM | POA: Diagnosis not present

## 2018-12-12 DIAGNOSIS — Z20828 Contact with and (suspected) exposure to other viral communicable diseases: Secondary | ICD-10-CM | POA: Diagnosis present

## 2018-12-12 DIAGNOSIS — Z9889 Other specified postprocedural states: Secondary | ICD-10-CM

## 2018-12-12 DIAGNOSIS — I110 Hypertensive heart disease with heart failure: Secondary | ICD-10-CM | POA: Diagnosis present

## 2018-12-12 DIAGNOSIS — R579 Shock, unspecified: Secondary | ICD-10-CM | POA: Diagnosis not present

## 2018-12-12 DIAGNOSIS — D696 Thrombocytopenia, unspecified: Secondary | ICD-10-CM | POA: Diagnosis not present

## 2018-12-12 DIAGNOSIS — I4891 Unspecified atrial fibrillation: Secondary | ICD-10-CM | POA: Diagnosis not present

## 2018-12-12 DIAGNOSIS — I2510 Atherosclerotic heart disease of native coronary artery without angina pectoris: Secondary | ICD-10-CM

## 2018-12-12 DIAGNOSIS — T819XXA Unspecified complication of procedure, initial encounter: Secondary | ICD-10-CM

## 2018-12-12 DIAGNOSIS — G8929 Other chronic pain: Secondary | ICD-10-CM | POA: Diagnosis present

## 2018-12-12 DIAGNOSIS — I252 Old myocardial infarction: Secondary | ICD-10-CM

## 2018-12-12 DIAGNOSIS — Z833 Family history of diabetes mellitus: Secondary | ICD-10-CM

## 2018-12-12 DIAGNOSIS — Z951 Presence of aortocoronary bypass graft: Secondary | ICD-10-CM

## 2018-12-12 DIAGNOSIS — M25561 Pain in right knee: Secondary | ICD-10-CM | POA: Diagnosis present

## 2018-12-12 DIAGNOSIS — Z9689 Presence of other specified functional implants: Secondary | ICD-10-CM

## 2018-12-12 DIAGNOSIS — Z8249 Family history of ischemic heart disease and other diseases of the circulatory system: Secondary | ICD-10-CM

## 2018-12-12 DIAGNOSIS — J95830 Postprocedural hemorrhage and hematoma of a respiratory system organ or structure following a respiratory system procedure: Secondary | ICD-10-CM | POA: Diagnosis not present

## 2018-12-12 DIAGNOSIS — Z79899 Other long term (current) drug therapy: Secondary | ICD-10-CM

## 2018-12-12 HISTORY — PX: MEDIASTINAL EXPLORATION: SHX5065

## 2018-12-12 HISTORY — PX: TEE WITHOUT CARDIOVERSION: SHX5443

## 2018-12-12 HISTORY — PX: CORONARY ARTERY BYPASS GRAFT: SHX141

## 2018-12-12 LAB — POCT I-STAT, CHEM 8
BUN: 21 mg/dL (ref 8–23)
BUN: 17 mg/dL (ref 8–23)
BUN: 20 mg/dL (ref 8–23)
BUN: 18 mg/dL (ref 8–23)
BUN: 20 mg/dL (ref 8–23)
BUN: 18 mg/dL (ref 8–23)
BUN: 19 mg/dL (ref 8–23)
BUN: 16 mg/dL (ref 8–23)
BUN: 18 mg/dL (ref 8–23)
Calcium, Ion: 1.18 mmol/L (ref 1.15–1.40)
Calcium, Ion: 0.94 mmol/L — ABNORMAL LOW (ref 1.15–1.40)
Calcium, Ion: 1.21 mmol/L (ref 1.15–1.40)
Calcium, Ion: 1.1 mmol/L — ABNORMAL LOW (ref 1.15–1.40)
Calcium, Ion: 1.06 mmol/L — ABNORMAL LOW (ref 1.15–1.40)
Calcium, Ion: 1.27 mmol/L (ref 1.15–1.40)
Calcium, Ion: 1.09 mmol/L — ABNORMAL LOW (ref 1.15–1.40)
Calcium, Ion: 0.98 mmol/L — ABNORMAL LOW (ref 1.15–1.40)
Calcium, Ion: 1.11 mmol/L — ABNORMAL LOW (ref 1.15–1.40)
Chloride: 104 mmol/L (ref 98–111)
Chloride: 103 mmol/L (ref 98–111)
Chloride: 99 mmol/L (ref 98–111)
Chloride: 99 mmol/L (ref 98–111)
Chloride: 103 mmol/L (ref 98–111)
Chloride: 102 mmol/L (ref 98–111)
Chloride: 106 mmol/L (ref 98–111)
Chloride: 104 mmol/L (ref 98–111)
Chloride: 104 mmol/L (ref 98–111)
Creatinine, Ser: 0.8 mg/dL (ref 0.61–1.24)
Creatinine, Ser: 0.6 mg/dL — ABNORMAL LOW (ref 0.61–1.24)
Creatinine, Ser: 0.7 mg/dL (ref 0.61–1.24)
Creatinine, Ser: 0.6 mg/dL — ABNORMAL LOW (ref 0.61–1.24)
Creatinine, Ser: 0.6 mg/dL — ABNORMAL LOW (ref 0.61–1.24)
Creatinine, Ser: 0.7 mg/dL (ref 0.61–1.24)
Creatinine, Ser: 0.7 mg/dL (ref 0.61–1.24)
Creatinine, Ser: 0.8 mg/dL (ref 0.61–1.24)
Creatinine, Ser: 0.8 mg/dL (ref 0.61–1.24)
Glucose, Bld: 108 mg/dL — ABNORMAL HIGH (ref 70–99)
Glucose, Bld: 116 mg/dL — ABNORMAL HIGH (ref 70–99)
Glucose, Bld: 99 mg/dL (ref 70–99)
Glucose, Bld: 143 mg/dL — ABNORMAL HIGH (ref 70–99)
Glucose, Bld: 140 mg/dL — ABNORMAL HIGH (ref 70–99)
Glucose, Bld: 139 mg/dL — ABNORMAL HIGH (ref 70–99)
Glucose, Bld: 175 mg/dL — ABNORMAL HIGH (ref 70–99)
Glucose, Bld: 182 mg/dL — ABNORMAL HIGH (ref 70–99)
Glucose, Bld: 200 mg/dL — ABNORMAL HIGH (ref 70–99)
HCT: 37 % — ABNORMAL LOW (ref 39.0–52.0)
HCT: 31 % — ABNORMAL LOW (ref 39.0–52.0)
HCT: 34 % — ABNORMAL LOW (ref 39.0–52.0)
HCT: 30 % — ABNORMAL LOW (ref 39.0–52.0)
HCT: 32 % — ABNORMAL LOW (ref 39.0–52.0)
HCT: 30 % — ABNORMAL LOW (ref 39.0–52.0)
HCT: 25 % — ABNORMAL LOW (ref 39.0–52.0)
HCT: 20 % — ABNORMAL LOW (ref 39.0–52.0)
HCT: 24 % — ABNORMAL LOW (ref 39.0–52.0)
Hemoglobin: 12.6 g/dL — ABNORMAL LOW (ref 13.0–17.0)
Hemoglobin: 10.5 g/dL — ABNORMAL LOW (ref 13.0–17.0)
Hemoglobin: 11.6 g/dL — ABNORMAL LOW (ref 13.0–17.0)
Hemoglobin: 10.2 g/dL — ABNORMAL LOW (ref 13.0–17.0)
Hemoglobin: 10.9 g/dL — ABNORMAL LOW (ref 13.0–17.0)
Hemoglobin: 10.2 g/dL — ABNORMAL LOW (ref 13.0–17.0)
Hemoglobin: 8.5 g/dL — ABNORMAL LOW (ref 13.0–17.0)
Hemoglobin: 6.8 g/dL — CL (ref 13.0–17.0)
Hemoglobin: 8.2 g/dL — ABNORMAL LOW (ref 13.0–17.0)
Potassium: 3.7 mmol/L (ref 3.5–5.1)
Potassium: 3.9 mmol/L (ref 3.5–5.1)
Potassium: 4.3 mmol/L (ref 3.5–5.1)
Potassium: 5 mmol/L (ref 3.5–5.1)
Potassium: 4.1 mmol/L (ref 3.5–5.1)
Potassium: 3.5 mmol/L (ref 3.5–5.1)
Potassium: 4 mmol/L (ref 3.5–5.1)
Potassium: 4 mmol/L (ref 3.5–5.1)
Potassium: 4.6 mmol/L (ref 3.5–5.1)
Sodium: 141 mmol/L (ref 135–145)
Sodium: 140 mmol/L (ref 135–145)
Sodium: 141 mmol/L (ref 135–145)
Sodium: 137 mmol/L (ref 135–145)
Sodium: 138 mmol/L (ref 135–145)
Sodium: 141 mmol/L (ref 135–145)
Sodium: 141 mmol/L (ref 135–145)
Sodium: 141 mmol/L (ref 135–145)
Sodium: 142 mmol/L (ref 135–145)
TCO2: 24 mmol/L (ref 22–32)
TCO2: 25 mmol/L (ref 22–32)
TCO2: 28 mmol/L (ref 22–32)
TCO2: 26 mmol/L (ref 22–32)
TCO2: 29 mmol/L (ref 22–32)
TCO2: 23 mmol/L (ref 22–32)
TCO2: 24 mmol/L (ref 22–32)
TCO2: 22 mmol/L (ref 22–32)
TCO2: 23 mmol/L (ref 22–32)

## 2018-12-12 LAB — POCT I-STAT 7, (LYTES, BLD GAS, ICA,H+H)
Acid-Base Excess: 1 mmol/L (ref 0.0–2.0)
Acid-base deficit: 1 mmol/L (ref 0.0–2.0)
Acid-base deficit: 2 mmol/L (ref 0.0–2.0)
Acid-base deficit: 4 mmol/L — ABNORMAL HIGH (ref 0.0–2.0)
Acid-base deficit: 5 mmol/L — ABNORMAL HIGH (ref 0.0–2.0)
Acid-base deficit: 5 mmol/L — ABNORMAL HIGH (ref 0.0–2.0)
Bicarbonate: 26.4 mmol/L (ref 20.0–28.0)
Bicarbonate: 24.5 mmol/L (ref 20.0–28.0)
Bicarbonate: 23.6 mmol/L (ref 20.0–28.0)
Bicarbonate: 22 mmol/L (ref 20.0–28.0)
Bicarbonate: 22 mmol/L (ref 20.0–28.0)
Bicarbonate: 21.8 mmol/L (ref 20.0–28.0)
Calcium, Ion: 0.95 mmol/L — ABNORMAL LOW (ref 1.15–1.40)
Calcium, Ion: 1.26 mmol/L (ref 1.15–1.40)
Calcium, Ion: 1.19 mmol/L (ref 1.15–1.40)
Calcium, Ion: 1.08 mmol/L — ABNORMAL LOW (ref 1.15–1.40)
Calcium, Ion: 1.12 mmol/L — ABNORMAL LOW (ref 1.15–1.40)
Calcium, Ion: 1.08 mmol/L — ABNORMAL LOW (ref 1.15–1.40)
HCT: 28 % — ABNORMAL LOW (ref 39.0–52.0)
HCT: 28 % — ABNORMAL LOW (ref 39.0–52.0)
HCT: 30 % — ABNORMAL LOW (ref 39.0–52.0)
HCT: 18 % — ABNORMAL LOW (ref 39.0–52.0)
HCT: 24 % — ABNORMAL LOW (ref 39.0–52.0)
HCT: 27 % — ABNORMAL LOW (ref 39.0–52.0)
Hemoglobin: 9.5 g/dL — ABNORMAL LOW (ref 13.0–17.0)
Hemoglobin: 9.5 g/dL — ABNORMAL LOW (ref 13.0–17.0)
Hemoglobin: 10.2 g/dL — ABNORMAL LOW (ref 13.0–17.0)
Hemoglobin: 6.1 g/dL — CL (ref 13.0–17.0)
Hemoglobin: 8.2 g/dL — ABNORMAL LOW (ref 13.0–17.0)
Hemoglobin: 9.2 g/dL — ABNORMAL LOW (ref 13.0–17.0)
O2 Saturation: 100 %
O2 Saturation: 97 %
O2 Saturation: 99 %
O2 Saturation: 100 %
O2 Saturation: 98 %
O2 Saturation: 94 %
Patient temperature: 36.1
Patient temperature: 37.3
Potassium: 4.3 mmol/L (ref 3.5–5.1)
Potassium: 3.6 mmol/L (ref 3.5–5.1)
Potassium: 3.7 mmol/L (ref 3.5–5.1)
Potassium: 4.1 mmol/L (ref 3.5–5.1)
Potassium: 4 mmol/L (ref 3.5–5.1)
Potassium: 4.2 mmol/L (ref 3.5–5.1)
Sodium: 141 mmol/L (ref 135–145)
Sodium: 141 mmol/L (ref 135–145)
Sodium: 141 mmol/L (ref 135–145)
Sodium: 142 mmol/L (ref 135–145)
Sodium: 142 mmol/L (ref 135–145)
Sodium: 142 mmol/L (ref 135–145)
TCO2: 28 mmol/L (ref 22–32)
TCO2: 26 mmol/L (ref 22–32)
TCO2: 25 mmol/L (ref 22–32)
TCO2: 23 mmol/L (ref 22–32)
TCO2: 23 mmol/L (ref 22–32)
TCO2: 23 mmol/L (ref 22–32)
pCO2 arterial: 45 mmHg (ref 32.0–48.0)
pCO2 arterial: 40.8 mmHg (ref 32.0–48.0)
pCO2 arterial: 43.2 mmHg (ref 32.0–48.0)
pCO2 arterial: 43.8 mmHg (ref 32.0–48.0)
pCO2 arterial: 49 mmHg — ABNORMAL HIGH (ref 32.0–48.0)
pCO2 arterial: 49.4 mmHg — ABNORMAL HIGH (ref 32.0–48.0)
pH, Arterial: 7.376 (ref 7.350–7.450)
pH, Arterial: 7.386 (ref 7.350–7.450)
pH, Arterial: 7.341 — ABNORMAL LOW (ref 7.350–7.450)
pH, Arterial: 7.309 — ABNORMAL LOW (ref 7.350–7.450)
pH, Arterial: 7.259 — ABNORMAL LOW (ref 7.350–7.450)
pH, Arterial: 7.255 — ABNORMAL LOW (ref 7.350–7.450)
pO2, Arterial: 364 mmHg — ABNORMAL HIGH (ref 83.0–108.0)
pO2, Arterial: 94 mmHg (ref 83.0–108.0)
pO2, Arterial: 146 mmHg — ABNORMAL HIGH (ref 83.0–108.0)
pO2, Arterial: 312 mmHg — ABNORMAL HIGH (ref 83.0–108.0)
pO2, Arterial: 126 mmHg — ABNORMAL HIGH (ref 83.0–108.0)
pO2, Arterial: 84 mmHg (ref 83.0–108.0)

## 2018-12-12 LAB — PLATELET COUNT: Platelets: 119 10*3/uL — ABNORMAL LOW (ref 150–400)

## 2018-12-12 LAB — CBC
HCT: 30.6 % — ABNORMAL LOW (ref 39.0–52.0)
HCT: 22.9 % — ABNORMAL LOW (ref 39.0–52.0)
HCT: 31.8 % — ABNORMAL LOW (ref 39.0–52.0)
Hemoglobin: 10.3 g/dL — ABNORMAL LOW (ref 13.0–17.0)
Hemoglobin: 7.7 g/dL — ABNORMAL LOW (ref 13.0–17.0)
Hemoglobin: 10.6 g/dL — ABNORMAL LOW (ref 13.0–17.0)
MCH: 30.5 pg (ref 26.0–34.0)
MCH: 30.7 pg (ref 26.0–34.0)
MCH: 30.5 pg (ref 26.0–34.0)
MCHC: 33.7 g/dL (ref 30.0–36.0)
MCHC: 33.6 g/dL (ref 30.0–36.0)
MCHC: 33.3 g/dL (ref 30.0–36.0)
MCV: 90.5 fL (ref 80.0–100.0)
MCV: 91.2 fL (ref 80.0–100.0)
MCV: 91.6 fL (ref 80.0–100.0)
Platelets: 138 10*3/uL — ABNORMAL LOW (ref 150–400)
Platelets: 163 10*3/uL (ref 150–400)
Platelets: 120 10*3/uL — ABNORMAL LOW (ref 150–400)
RBC: 3.38 MIL/uL — ABNORMAL LOW (ref 4.22–5.81)
RBC: 2.51 MIL/uL — ABNORMAL LOW (ref 4.22–5.81)
RBC: 3.47 MIL/uL — ABNORMAL LOW (ref 4.22–5.81)
RDW: 12.2 % (ref 11.5–15.5)
RDW: 12.2 % (ref 11.5–15.5)
RDW: 13.1 % (ref 11.5–15.5)
WBC: 14.8 10*3/uL — ABNORMAL HIGH (ref 4.0–10.5)
WBC: 12.5 10*3/uL — ABNORMAL HIGH (ref 4.0–10.5)
WBC: 11 10*3/uL — ABNORMAL HIGH (ref 4.0–10.5)
nRBC: 0 % (ref 0.0–0.2)
nRBC: 0 % (ref 0.0–0.2)
nRBC: 0 % (ref 0.0–0.2)

## 2018-12-12 LAB — GLUCOSE, CAPILLARY
Glucose-Capillary: 144 mg/dL — ABNORMAL HIGH (ref 70–99)
Glucose-Capillary: 144 mg/dL — ABNORMAL HIGH (ref 70–99)
Glucose-Capillary: 147 mg/dL — ABNORMAL HIGH (ref 70–99)
Glucose-Capillary: 166 mg/dL — ABNORMAL HIGH (ref 70–99)
Glucose-Capillary: 164 mg/dL — ABNORMAL HIGH (ref 70–99)
Glucose-Capillary: 188 mg/dL — ABNORMAL HIGH (ref 70–99)

## 2018-12-12 LAB — PROTIME-INR
INR: 1.5 — ABNORMAL HIGH (ref 0.8–1.2)
INR: 1.8 — ABNORMAL HIGH (ref 0.8–1.2)
Prothrombin Time: 17.6 seconds — ABNORMAL HIGH (ref 11.4–15.2)
Prothrombin Time: 20.9 seconds — ABNORMAL HIGH (ref 11.4–15.2)

## 2018-12-12 LAB — APTT: aPTT: 32 seconds (ref 24–36)

## 2018-12-12 LAB — HEMOGLOBIN AND HEMATOCRIT, BLOOD
HCT: 30 % — ABNORMAL LOW (ref 39.0–52.0)
Hemoglobin: 10.2 g/dL — ABNORMAL LOW (ref 13.0–17.0)

## 2018-12-12 LAB — PREPARE RBC (CROSSMATCH)

## 2018-12-12 SURGERY — CORONARY ARTERY BYPASS GRAFTING (CABG)
Anesthesia: General | Site: Chest

## 2018-12-12 SURGERY — EXPLORATION, MEDIASTINUM
Anesthesia: General | Site: Chest

## 2018-12-12 MED ORDER — AMIODARONE HCL IN DEXTROSE 360-4.14 MG/200ML-% IV SOLN
60.0000 mg/h | INTRAVENOUS | Status: DC
  Administered 2018-12-12: 16:00:00 60 mg/h via INTRAVENOUS
  Filled 2018-12-12: qty 200

## 2018-12-12 MED ORDER — SODIUM CHLORIDE (PF) 0.9 % IJ SOLN
INTRAMUSCULAR | Status: AC
  Filled 2018-12-12: qty 10

## 2018-12-12 MED ORDER — SODIUM CHLORIDE 0.9% IV SOLUTION: Freq: Once | INTRAVENOUS | Status: DC

## 2018-12-12 MED ORDER — ORAL CARE MOUTH RINSE
15.0000 mL | OROMUCOSAL | Status: DC
  Administered 2018-12-12 – 2018-12-13 (×5): 15 mL via OROMUCOSAL

## 2018-12-12 MED ORDER — MILRINONE LACTATE IN DEXTROSE 20-5 MG/100ML-% IV SOLN
0.2500 ug/kg/min | INTRAVENOUS | Status: DC
  Administered 2018-12-12: 0.25 ug/kg/min via INTRAVENOUS
  Filled 2018-12-12: qty 100

## 2018-12-12 MED ORDER — FENTANYL CITRATE (PF) 250 MCG/5ML IJ SOLN
INTRAMUSCULAR | Status: DC | PRN
  Administered 2018-12-12: 100 ug via INTRAVENOUS
  Administered 2018-12-12: 150 ug via INTRAVENOUS

## 2018-12-12 MED ORDER — AMIODARONE HCL IN DEXTROSE 360-4.14 MG/200ML-% IV SOLN
60.0000 mg/h | INTRAVENOUS | Status: AC
  Administered 2018-12-12: 60 mg/h via INTRAVENOUS
  Filled 2018-12-12: qty 200

## 2018-12-12 MED ORDER — BUPIVACAINE LIPOSOME 1.3 % IJ SUSP
INTRAMUSCULAR | Status: DC | PRN
  Administered 2018-12-12: 20 mL

## 2018-12-12 MED ORDER — ASPIRIN EC 325 MG PO TBEC: 325.0000 mg | DELAYED_RELEASE_TABLET | Freq: Every day | ORAL | Status: DC

## 2018-12-12 MED ORDER — STERILE WATER FOR INJECTION IJ SOLN: INTRAMUSCULAR | Status: DC | PRN

## 2018-12-12 MED ORDER — BUPIVACAINE HCL (PF) 0.5 % IJ SOLN
INTRAMUSCULAR | Status: AC
  Filled 2018-12-12: qty 30

## 2018-12-12 MED ORDER — MIDAZOLAM HCL (PF) 5 MG/ML IJ SOLN
INTRAMUSCULAR | Status: DC | PRN
  Administered 2018-12-12: 2 mg via INTRAVENOUS
  Administered 2018-12-12: 5 mg via INTRAVENOUS
  Administered 2018-12-12: 3 mg via INTRAVENOUS

## 2018-12-12 MED ORDER — ALBUMIN HUMAN 5 % IV SOLN
250.0000 mL | INTRAVENOUS | Status: AC | PRN
  Administered 2018-12-12 (×4): 12.5 g via INTRAVENOUS
  Filled 2018-12-12: qty 500

## 2018-12-12 MED ORDER — ASPIRIN 81 MG PO CHEW: 324.0000 mg | CHEWABLE_TABLET | Freq: Every day | ORAL | Status: DC

## 2018-12-12 MED ORDER — FENTANYL CITRATE (PF) 250 MCG/5ML IJ SOLN
INTRAMUSCULAR | Status: AC
  Filled 2018-12-12: qty 10

## 2018-12-12 MED ORDER — GLYCOPYRROLATE 0.2 MG/ML IJ SOLN
INTRAMUSCULAR | Status: DC | PRN
  Administered 2018-12-12: 0.2 mg via INTRAVENOUS

## 2018-12-12 MED ORDER — TRANEXAMIC ACID 1000 MG/10ML IV SOLN
INTRAVENOUS | Status: DC | PRN
  Administered 2018-12-12: 1.5 mg/kg/h via INTRAVENOUS

## 2018-12-12 MED ORDER — DEXTROSE 50 % IV SOLN: 0.0000 mL | INTRAVENOUS | Status: DC | PRN

## 2018-12-12 MED ORDER — ACETAMINOPHEN 160 MG/5ML PO SOLN: 650.0000 mg | Freq: Once | ORAL | Status: AC

## 2018-12-12 MED ORDER — AMIODARONE HCL IN DEXTROSE 360-4.14 MG/200ML-% IV SOLN
30.0000 mg/h | INTRAVENOUS | Status: DC
  Administered 2018-12-12 – 2018-12-13 (×2): 30 mg/h via INTRAVENOUS
  Filled 2018-12-12 (×2): qty 200

## 2018-12-12 MED ORDER — PROTAMINE SULFATE 10 MG/ML IV SOLN
INTRAVENOUS | Status: DC | PRN
  Administered 2018-12-12: 30 mg via INTRAVENOUS

## 2018-12-12 MED ORDER — SODIUM CHLORIDE 0.9% FLUSH: 3.0000 mL | INTRAVENOUS | Status: DC | PRN

## 2018-12-12 MED ORDER — ACETAMINOPHEN 650 MG RE SUPP
650.0000 mg | Freq: Once | RECTAL | Status: AC
  Administered 2018-12-12: 650 mg via RECTAL

## 2018-12-12 MED ORDER — BISACODYL 5 MG PO TBEC
10.0000 mg | DELAYED_RELEASE_TABLET | Freq: Every day | ORAL | Status: DC
  Administered 2018-12-14 – 2018-12-20 (×6): 10 mg via ORAL
  Filled 2018-12-12 (×6): qty 2

## 2018-12-12 MED ORDER — SODIUM CHLORIDE 0.9 % IV SOLN
INTRAVENOUS | Status: DC | PRN
  Administered 2018-12-12 (×2): via INTRAVENOUS

## 2018-12-12 MED ORDER — SODIUM BICARBONATE 8.4 % IV SOLN
INTRAVENOUS | Status: DC | PRN
  Administered 2018-12-12: 50 meq via INTRAVENOUS

## 2018-12-12 MED ORDER — VASOPRESSIN 20 UNIT/ML IV SOLN
INTRAVENOUS | Status: AC
  Filled 2018-12-12: qty 1

## 2018-12-12 MED ORDER — HEPARIN SODIUM (PORCINE) 1000 UNIT/ML IJ SOLN
INTRAMUSCULAR | Status: DC | PRN
  Administered 2018-12-12: 30000 [IU] via INTRAVENOUS

## 2018-12-12 MED ORDER — PHENYLEPHRINE HCL-NACL 20-0.9 MG/250ML-% IV SOLN
0.0000 ug/min | INTRAVENOUS | Status: DC
  Administered 2018-12-12: 50 ug/min via INTRAVENOUS
  Administered 2018-12-12: 85 ug/min via INTRAVENOUS
  Administered 2018-12-13: 70 ug/min via INTRAVENOUS
  Administered 2018-12-13: 50 ug/min via INTRAVENOUS
  Administered 2018-12-13: 15 ug/min via INTRAVENOUS
  Filled 2018-12-12 (×5): qty 250

## 2018-12-12 MED ORDER — FAMOTIDINE IN NACL 20-0.9 MG/50ML-% IV SOLN
20.0000 mg | Freq: Two times a day (BID) | INTRAVENOUS | Status: AC
  Administered 2018-12-12 (×2): 20 mg via INTRAVENOUS
  Filled 2018-12-12: qty 50

## 2018-12-12 MED ORDER — MAGNESIUM SULFATE 4 GM/100ML IV SOLN
4.0000 g | Freq: Once | INTRAVENOUS | Status: AC
  Administered 2018-12-12: 4 g via INTRAVENOUS
  Filled 2018-12-12: qty 100

## 2018-12-12 MED ORDER — HEPARIN SODIUM (PORCINE) 1000 UNIT/ML IJ SOLN
INTRAMUSCULAR | Status: AC
  Filled 2018-12-12: qty 1

## 2018-12-12 MED ORDER — MIDAZOLAM HCL 2 MG/2ML IJ SOLN
2.0000 mg | INTRAMUSCULAR | Status: DC | PRN
  Administered 2018-12-12 – 2018-12-13 (×2): 2 mg via INTRAVENOUS
  Filled 2018-12-12 (×2): qty 2

## 2018-12-12 MED ORDER — PROPOFOL 10 MG/ML IV BOLUS
INTRAVENOUS | Status: AC
  Filled 2018-12-12: qty 20

## 2018-12-12 MED ORDER — EPINEPHRINE 1 MG/10ML IJ SOSY
PREFILLED_SYRINGE | INTRAMUSCULAR | Status: AC
  Filled 2018-12-12: qty 20

## 2018-12-12 MED ORDER — FENTANYL CITRATE (PF) 250 MCG/5ML IJ SOLN
INTRAMUSCULAR | Status: AC
  Filled 2018-12-12: qty 20

## 2018-12-12 MED ORDER — LACTATED RINGERS IV SOLN
INTRAVENOUS | Status: DC | PRN
  Administered 2018-12-12 (×4): via INTRAVENOUS

## 2018-12-12 MED ORDER — STERILE WATER FOR INJECTION IJ SOLN
INTRAMUSCULAR | Status: AC
  Filled 2018-12-12: qty 10

## 2018-12-12 MED ORDER — CHLORHEXIDINE GLUCONATE 0.12% ORAL RINSE (MEDLINE KIT): 15.0000 mL | Freq: Two times a day (BID) | OROMUCOSAL | Status: DC

## 2018-12-12 MED ORDER — NON FORMULARY
Status: DC | PRN
  Administered 2018-12-12: 30 mL

## 2018-12-12 MED ORDER — ROCURONIUM BROMIDE 10 MG/ML (PF) SYRINGE
PREFILLED_SYRINGE | INTRAVENOUS | Status: DC | PRN
  Administered 2018-12-12 (×5): 50 mg via INTRAVENOUS

## 2018-12-12 MED ORDER — HEMOSTATIC AGENTS (NO CHARGE) OPTIME
TOPICAL | Status: DC | PRN
  Administered 2018-12-12: 1 via TOPICAL

## 2018-12-12 MED ORDER — ACETAMINOPHEN 160 MG/5ML PO SOLN
1000.0000 mg | Freq: Four times a day (QID) | ORAL | Status: AC
  Administered 2018-12-13 (×2): 1000 mg
  Filled 2018-12-12 (×2): qty 40.6

## 2018-12-12 MED ORDER — METOPROLOL TARTRATE 12.5 MG HALF TABLET: 12.5000 mg | ORAL_TABLET | Freq: Two times a day (BID) | ORAL | Status: DC

## 2018-12-12 MED ORDER — SODIUM CHLORIDE 0.9 % IV SOLN
1.0000 g | Freq: Once | INTRAVENOUS | Status: AC
  Administered 2018-12-13: 1 g via INTRAVENOUS
  Filled 2018-12-12: qty 10

## 2018-12-12 MED ORDER — POTASSIUM CHLORIDE 10 MEQ/50ML IV SOLN
10.0000 meq | INTRAVENOUS | Status: AC
  Administered 2018-12-12 (×3): 10 meq via INTRAVENOUS

## 2018-12-12 MED ORDER — GLYCOPYRROLATE PF 0.2 MG/ML IJ SOSY
PREFILLED_SYRINGE | INTRAMUSCULAR | Status: AC
  Filled 2018-12-12: qty 1

## 2018-12-12 MED ORDER — MORPHINE SULFATE (PF) 2 MG/ML IV SOLN
1.0000 mg | INTRAVENOUS | Status: DC | PRN
  Administered 2018-12-13 (×2): 2 mg via INTRAVENOUS
  Filled 2018-12-12 (×2): qty 1

## 2018-12-12 MED ORDER — SODIUM BICARBONATE 8.4 % IV SOLN
50.0000 meq | Freq: Once | INTRAVENOUS | Status: AC
  Administered 2018-12-12: 50 meq via INTRAVENOUS

## 2018-12-12 MED ORDER — CHLORHEXIDINE GLUCONATE 0.12 % MT SOLN
15.0000 mL | Freq: Once | OROMUCOSAL | Status: AC
  Administered 2018-12-12: 15 mL via OROMUCOSAL
  Filled 2018-12-12: qty 15

## 2018-12-12 MED ORDER — LACTATED RINGERS IV SOLN
INTRAVENOUS | Status: DC
  Administered 2018-12-12: 19:00:00 via INTRAVENOUS

## 2018-12-12 MED ORDER — ALBUMIN HUMAN 5 % IV SOLN
INTRAVENOUS | Status: DC | PRN
  Administered 2018-12-12 (×2): via INTRAVENOUS

## 2018-12-12 MED ORDER — SODIUM CHLORIDE 0.9% FLUSH
3.0000 mL | Freq: Two times a day (BID) | INTRAVENOUS | Status: DC
  Administered 2018-12-13: 3 mL via INTRAVENOUS

## 2018-12-12 MED ORDER — CHLORHEXIDINE GLUCONATE CLOTH 2 % EX PADS
6.0000 | MEDICATED_PAD | Freq: Every day | CUTANEOUS | Status: DC
  Administered 2018-12-14 – 2018-12-16 (×2): 6 via TOPICAL

## 2018-12-12 MED ORDER — CHLORHEXIDINE GLUCONATE 0.12 % MT SOLN
15.0000 mL | OROMUCOSAL | Status: AC
  Administered 2018-12-12: 15 mL via OROMUCOSAL

## 2018-12-12 MED ORDER — SODIUM CHLORIDE 0.9 % IV SOLN
20.0000 ug | Freq: Once | INTRAVENOUS | Status: AC
  Administered 2018-12-12: 21:00:00 20 ug via INTRAVENOUS
  Filled 2018-12-12: qty 5

## 2018-12-12 MED ORDER — VANCOMYCIN HCL 1000 MG IV SOLR
INTRAVENOUS | Status: DC | PRN
  Administered 2018-12-12: 3 g

## 2018-12-12 MED ORDER — ALBUMIN HUMAN 5 % IV SOLN
12.5000 g | Freq: Once | INTRAVENOUS | Status: AC
  Administered 2018-12-12: 12.5 g via INTRAVENOUS

## 2018-12-12 MED ORDER — PROTAMINE SULFATE 10 MG/ML IV SOLN
INTRAVENOUS | Status: AC
  Filled 2018-12-12: qty 5

## 2018-12-12 MED ORDER — PROTAMINE SULFATE 10 MG/ML IV SOLN
INTRAVENOUS | Status: AC
  Filled 2018-12-12: qty 25

## 2018-12-12 MED ORDER — 0.9 % SODIUM CHLORIDE (POUR BTL) OPTIME
TOPICAL | Status: DC | PRN
  Administered 2018-12-12: 09:00:00 5000 mL

## 2018-12-12 MED ORDER — SODIUM CHLORIDE 0.9 % IV SOLN
INTRAVENOUS | Status: DC
  Administered 2018-12-12: 15:00:00 via INTRAVENOUS

## 2018-12-12 MED ORDER — DEXMEDETOMIDINE HCL IN NACL 400 MCG/100ML IV SOLN
INTRAVENOUS | Status: DC | PRN
  Administered 2018-12-12: 0.7 ug/kg/h via INTRAVENOUS

## 2018-12-12 MED ORDER — HEPARIN SODIUM (PORCINE) 1000 UNIT/ML IJ SOLN
INTRAMUSCULAR | Status: DC | PRN
  Administered 2018-12-12: 37000 [IU] via INTRAVENOUS

## 2018-12-12 MED ORDER — FENTANYL CITRATE (PF) 250 MCG/5ML IJ SOLN
INTRAMUSCULAR | Status: AC
  Filled 2018-12-12: qty 5

## 2018-12-12 MED ORDER — NITROGLYCERIN IN D5W 200-5 MCG/ML-% IV SOLN: 0.0000 ug/min | INTRAVENOUS | Status: DC

## 2018-12-12 MED ORDER — SODIUM CHLORIDE 0.9 % IV SOLN
INTRAVENOUS | Status: AC
  Filled 2018-12-12: qty 1.2

## 2018-12-12 MED ORDER — VANCOMYCIN HCL 1000 MG IV SOLR
INTRAVENOUS | Status: AC
  Filled 2018-12-12: qty 3000

## 2018-12-12 MED ORDER — 0.9 % SODIUM CHLORIDE (POUR BTL) OPTIME
TOPICAL | Status: DC | PRN
  Administered 2018-12-12: 5000 mL

## 2018-12-12 MED ORDER — PROTAMINE SULFATE 10 MG/ML IV SOLN
INTRAVENOUS | Status: AC
  Filled 2018-12-12: qty 10

## 2018-12-12 MED ORDER — DOCUSATE SODIUM 100 MG PO CAPS
200.0000 mg | ORAL_CAPSULE | Freq: Every day | ORAL | Status: DC
  Administered 2018-12-14 – 2018-12-19 (×5): 200 mg via ORAL
  Filled 2018-12-12 (×5): qty 2

## 2018-12-12 MED ORDER — SODIUM CHLORIDE 0.9% FLUSH: 10.0000 mL | INTRAVENOUS | Status: DC | PRN

## 2018-12-12 MED ORDER — FENTANYL CITRATE (PF) 250 MCG/5ML IJ SOLN
INTRAMUSCULAR | Status: DC | PRN
  Administered 2018-12-12 (×6): 250 ug via INTRAVENOUS

## 2018-12-12 MED ORDER — PHENYLEPHRINE HCL-NACL 10-0.9 MG/250ML-% IV SOLN
INTRAVENOUS | Status: DC | PRN
  Administered 2018-12-12: 10 ug/min via INTRAVENOUS

## 2018-12-12 MED ORDER — INSULIN REGULAR(HUMAN) IN NACL 100-0.9 UT/100ML-% IV SOLN
INTRAVENOUS | Status: DC
  Administered 2018-12-13: 4.6 [IU]/h via INTRAVENOUS
  Filled 2018-12-12: qty 100

## 2018-12-12 MED ORDER — SODIUM CHLORIDE 0.9% IV SOLUTION
Freq: Once | INTRAVENOUS | Status: AC
  Administered 2018-12-12: via INTRAVENOUS

## 2018-12-12 MED ORDER — METOPROLOL TARTRATE 5 MG/5ML IV SOLN: 2.5000 mg | INTRAVENOUS | Status: DC | PRN

## 2018-12-12 MED ORDER — PROTAMINE SULFATE 10 MG/ML IV SOLN
INTRAVENOUS | Status: DC | PRN
  Administered 2018-12-12 (×4): 50 mg via INTRAVENOUS
  Administered 2018-12-12 (×2): 10 mg via INTRAVENOUS
  Administered 2018-12-12: 50 mg via INTRAVENOUS

## 2018-12-12 MED ORDER — METOPROLOL TARTRATE 25 MG/10 ML ORAL SUSPENSION: 12.5000 mg | Freq: Two times a day (BID) | ORAL | Status: DC

## 2018-12-12 MED ORDER — VANCOMYCIN HCL IN DEXTROSE 1-5 GM/200ML-% IV SOLN
1000.0000 mg | Freq: Once | INTRAVENOUS | Status: AC
  Administered 2018-12-13: 1000 mg via INTRAVENOUS
  Filled 2018-12-12 (×2): qty 200

## 2018-12-12 MED ORDER — PANTOPRAZOLE SODIUM 40 MG PO TBEC
40.0000 mg | DELAYED_RELEASE_TABLET | Freq: Every day | ORAL | Status: DC
  Administered 2018-12-14 – 2018-12-18 (×5): 40 mg via ORAL
  Filled 2018-12-12 (×5): qty 1

## 2018-12-12 MED ORDER — HEMOSTATIC AGENTS (NO CHARGE) OPTIME
TOPICAL | Status: DC | PRN
  Administered 2018-12-12 (×2): 1 via TOPICAL

## 2018-12-12 MED ORDER — TRAMADOL HCL 50 MG PO TABS
50.0000 mg | ORAL_TABLET | ORAL | Status: DC | PRN
  Administered 2018-12-13 – 2018-12-17 (×6): 50 mg via ORAL
  Filled 2018-12-12 (×7): qty 1

## 2018-12-12 MED ORDER — LIDOCAINE 2% (20 MG/ML) 5 ML SYRINGE
INTRAMUSCULAR | Status: AC
  Filled 2018-12-12: qty 5

## 2018-12-12 MED ORDER — HEMOSTATIC AGENTS (NO CHARGE) OPTIME
TOPICAL | Status: DC | PRN
  Administered 2018-12-12: 3 via TOPICAL

## 2018-12-12 MED ORDER — ROCURONIUM BROMIDE 10 MG/ML (PF) SYRINGE
PREFILLED_SYRINGE | INTRAVENOUS | Status: AC
  Filled 2018-12-12: qty 10

## 2018-12-12 MED ORDER — DEXMEDETOMIDINE HCL IN NACL 400 MCG/100ML IV SOLN
0.0000 ug/kg/h | INTRAVENOUS | Status: DC
  Administered 2018-12-12 (×3): 0.7 ug/kg/h via INTRAVENOUS
  Filled 2018-12-12 (×2): qty 100

## 2018-12-12 MED ORDER — MIDAZOLAM HCL (PF) 10 MG/2ML IJ SOLN
INTRAMUSCULAR | Status: AC
  Filled 2018-12-12: qty 2

## 2018-12-12 MED ORDER — LACTATED RINGERS IV SOLN: INTRAVENOUS | Status: DC

## 2018-12-12 MED ORDER — ROCURONIUM BROMIDE 10 MG/ML (PF) SYRINGE
PREFILLED_SYRINGE | INTRAVENOUS | Status: AC
  Filled 2018-12-12: qty 30

## 2018-12-12 MED ORDER — PROPOFOL 10 MG/ML IV BOLUS
INTRAVENOUS | Status: DC | PRN
  Administered 2018-12-12: 120 mg via INTRAVENOUS

## 2018-12-12 MED ORDER — METOPROLOL TARTRATE 12.5 MG HALF TABLET: 12.5000 mg | ORAL_TABLET | Freq: Once | ORAL | Status: DC

## 2018-12-12 MED ORDER — MIDAZOLAM HCL 5 MG/5ML IJ SOLN
INTRAMUSCULAR | Status: DC | PRN
  Administered 2018-12-12: 2 mg via INTRAVENOUS

## 2018-12-12 MED ORDER — SODIUM CHLORIDE 0.9% FLUSH
10.0000 mL | Freq: Two times a day (BID) | INTRAVENOUS | Status: DC
  Administered 2018-12-13: 10 mL

## 2018-12-12 MED ORDER — PHENYLEPHRINE HCL (PRESSORS) 10 MG/ML IV SOLN
INTRAVENOUS | Status: DC | PRN
  Administered 2018-12-12: 80 ug via INTRAVENOUS

## 2018-12-12 MED ORDER — ONDANSETRON HCL 4 MG/2ML IJ SOLN
4.0000 mg | Freq: Four times a day (QID) | INTRAMUSCULAR | Status: DC | PRN
  Administered 2018-12-13 – 2018-12-18 (×15): 4 mg via INTRAVENOUS
  Filled 2018-12-12 (×16): qty 2

## 2018-12-12 MED ORDER — SODIUM CHLORIDE 0.45 % IV SOLN: INTRAVENOUS | Status: DC | PRN

## 2018-12-12 MED ORDER — ROCURONIUM BROMIDE 100 MG/10ML IV SOLN
INTRAVENOUS | Status: DC | PRN
  Administered 2018-12-12 (×2): 50 mg via INTRAVENOUS

## 2018-12-12 MED ORDER — DEXMEDETOMIDINE HCL IN NACL 200 MCG/50ML IV SOLN
INTRAVENOUS | Status: AC
  Filled 2018-12-12: qty 50

## 2018-12-12 MED ORDER — ACETAMINOPHEN 500 MG PO TABS
1000.0000 mg | ORAL_TABLET | Freq: Four times a day (QID) | ORAL | Status: AC
  Administered 2018-12-13 – 2018-12-17 (×17): 1000 mg via ORAL
  Filled 2018-12-12 (×17): qty 2

## 2018-12-12 MED ORDER — CHLORHEXIDINE GLUCONATE 4 % EX LIQD: 30.0000 mL | CUTANEOUS | Status: DC

## 2018-12-12 MED ORDER — EPINEPHRINE PF 1 MG/ML IJ SOLN
0.0000 ug/min | INTRAVENOUS | Status: DC
  Filled 2018-12-12 (×3): qty 4

## 2018-12-12 MED ORDER — MIDAZOLAM HCL 2 MG/2ML IJ SOLN
INTRAMUSCULAR | Status: AC
  Filled 2018-12-12: qty 2

## 2018-12-12 MED ORDER — BISACODYL 10 MG RE SUPP: 10.0000 mg | Freq: Every day | RECTAL | Status: DC

## 2018-12-12 MED ORDER — OXYCODONE HCL 5 MG PO TABS
5.0000 mg | ORAL_TABLET | ORAL | Status: DC | PRN
  Administered 2018-12-13 – 2018-12-19 (×11): 5 mg via ORAL
  Filled 2018-12-12 (×12): qty 1

## 2018-12-12 MED ORDER — BUPIVACAINE HCL (PF) 0.5 % IJ SOLN
INTRAMUSCULAR | Status: DC | PRN
  Administered 2018-12-12: 30 mL

## 2018-12-12 MED ORDER — SODIUM CHLORIDE 0.9 % IV SOLN: 250.0000 mL | INTRAVENOUS | Status: DC

## 2018-12-12 MED ORDER — VASOPRESSIN 20 UNIT/ML IV SOLN
0.0100 [IU]/min | INTRAVENOUS | Status: AC
  Administered 2018-12-12 – 2018-12-14 (×3): 0.04 [IU]/min via INTRAVENOUS
  Filled 2018-12-12 (×4): qty 2

## 2018-12-12 MED ORDER — CALCIUM CHLORIDE 10 % IV SOLN
INTRAVENOUS | Status: DC | PRN
  Administered 2018-12-12: 800 mg via INTRAVENOUS

## 2018-12-12 MED ORDER — LACTATED RINGERS IV SOLN: 500.0000 mL | Freq: Once | INTRAVENOUS | Status: DC | PRN

## 2018-12-12 MED ORDER — MUPIROCIN 2 % EX OINT
1.0000 "application " | TOPICAL_OINTMENT | Freq: Two times a day (BID) | CUTANEOUS | Status: DC
  Administered 2018-12-12: 1 via TOPICAL
  Filled 2018-12-12: qty 22

## 2018-12-12 MED ORDER — SODIUM CHLORIDE 0.9 % IV SOLN
1.5000 g | Freq: Two times a day (BID) | INTRAVENOUS | Status: AC
  Administered 2018-12-13 – 2018-12-14 (×4): 1.5 g via INTRAVENOUS
  Filled 2018-12-12 (×4): qty 1.5

## 2018-12-12 MED ORDER — AMIODARONE IV BOLUS ONLY 150 MG/100ML
INTRAVENOUS | Status: DC | PRN
  Administered 2018-12-12: 150 mg via INTRAVENOUS

## 2018-12-12 MED FILL — MUPIROCIN 2% OINTMENT: 2 | 5 days supply | Qty: 22 | Fill #0

## 2018-12-12 SURGICAL SUPPLY — 119 items
ADAPTER CARDIO PERF ANTE/RETRO (ADAPTER) ×3 IMPLANT
BAG DECANTER FOR FLEXI CONT (MISCELLANEOUS) ×3 IMPLANT
BASKET HEART (ORDER IN 25'S) (MISCELLANEOUS) ×1
BASKET HEART (ORDER IN 25S) (MISCELLANEOUS) ×2 IMPLANT
BATTERY MAXDRIVER (MISCELLANEOUS) ×6 IMPLANT
BLADE 11 SAFETY STRL DISP (BLADE) ×3 IMPLANT
BLADE CLIPPER SURG (BLADE) ×3 IMPLANT
BLADE MINI RND TIP GREEN BEAV (BLADE) ×3 IMPLANT
BLADE STERNUM SYSTEM 6 (BLADE) ×3 IMPLANT
BNDG ELASTIC 4X5.8 VLCR STR LF (GAUZE/BANDAGES/DRESSINGS) ×3 IMPLANT
BNDG ELASTIC 6X10 VLCR STRL LF (GAUZE/BANDAGES/DRESSINGS) ×3 IMPLANT
BNDG ELASTIC 6X5.8 VLCR STR LF (GAUZE/BANDAGES/DRESSINGS) ×3 IMPLANT
BNDG GAUZE ELAST 4 BULKY (GAUZE/BANDAGES/DRESSINGS) ×3 IMPLANT
CANISTER SUCT 3000ML PPV (MISCELLANEOUS) ×3 IMPLANT
CANNULA NON VENT 20FR 12 (CANNULA) ×3 IMPLANT
CATH CPB KIT HENDRICKSON (MISCELLANEOUS) ×3 IMPLANT
CATH RETROPLEGIA CORONARY 14FR (CATHETERS) ×3 IMPLANT
CATH ROBINSON RED A/P 10FR (CATHETERS) ×3 IMPLANT
CATH ROBINSON RED A/P 18FR (CATHETERS) ×9 IMPLANT
CLIP RETRACTION 3.0MM CORONARY (MISCELLANEOUS) ×3 IMPLANT
CLIP VESOCCLUDE SM WIDE 24/CT (CLIP) ×6 IMPLANT
CONN ST 1/4X3/8  BEN (MISCELLANEOUS) ×1
CONN ST 1/4X3/8 BEN (MISCELLANEOUS) ×2 IMPLANT
CONT SPEC 4OZ CLIKSEAL STRL BL (MISCELLANEOUS) ×3 IMPLANT
COVER WAND RF STERILE (DRAPES) IMPLANT
DERMABOND ADHESIVE PROPEN (GAUZE/BANDAGES/DRESSINGS) ×2
DERMABOND ADVANCED (GAUZE/BANDAGES/DRESSINGS) ×3
DERMABOND ADVANCED .7 DNX12 (GAUZE/BANDAGES/DRESSINGS) ×6 IMPLANT
DERMABOND ADVANCED .7 DNX6 (GAUZE/BANDAGES/DRESSINGS) ×4 IMPLANT
DRAIN CHANNEL 28F RND 3/8 FF (WOUND CARE) ×9 IMPLANT
DRAPE CARDIOVASCULAR INCISE (DRAPES) ×1
DRAPE SLUSH/WARMER DISC (DRAPES) ×3 IMPLANT
DRAPE SRG 135X102X78XABS (DRAPES) ×2 IMPLANT
DRSG AQUACEL AG ADV 3.5X14 (GAUZE/BANDAGES/DRESSINGS) ×3 IMPLANT
ELECT BLADE 6.5 EXT (BLADE) ×3 IMPLANT
ELECT CAUTERY BLADE 6.4 (BLADE) ×3 IMPLANT
ELECT REM PT RETURN 9FT ADLT (ELECTROSURGICAL) ×6
ELECTRODE REM PT RTRN 9FT ADLT (ELECTROSURGICAL) ×4 IMPLANT
FELT TEFLON 1X6 (MISCELLANEOUS) ×6 IMPLANT
GAUZE SPONGE 4X4 12PLY STRL (GAUZE/BANDAGES/DRESSINGS) ×6 IMPLANT
GAUZE SPONGE 4X4 12PLY STRL LF (GAUZE/BANDAGES/DRESSINGS) ×6 IMPLANT
GLOVE BIO SURGEON STRL SZ 6.5 (GLOVE) ×15 IMPLANT
GLOVE BIOGEL PI IND STRL 6 (GLOVE) ×8 IMPLANT
GLOVE BIOGEL PI IND STRL 6.5 (GLOVE) ×4 IMPLANT
GLOVE BIOGEL PI IND STRL 9 (GLOVE) ×2 IMPLANT
GLOVE BIOGEL PI INDICATOR 6 (GLOVE) ×4
GLOVE BIOGEL PI INDICATOR 6.5 (GLOVE) ×2
GLOVE BIOGEL PI INDICATOR 9 (GLOVE) ×1
GLOVE INDICATOR 7.0 STRL GRN (GLOVE) ×3 IMPLANT
GLOVE NEODERM STRL 7.5 LF PF (GLOVE) ×6 IMPLANT
GLOVE SURG NEODERM 7.5  LF PF (GLOVE) ×3
GOWN STRL REUS W/ TWL LRG LVL3 (GOWN DISPOSABLE) ×18 IMPLANT
GOWN STRL REUS W/TWL LRG LVL3 (GOWN DISPOSABLE) ×9
HEMOSTAT POWDER SURGIFOAM 1G (HEMOSTASIS) ×9 IMPLANT
HEMOSTAT SURGICEL 2X14 (HEMOSTASIS) ×3 IMPLANT
INSERT FOGARTY XLG (MISCELLANEOUS) ×3 IMPLANT
KIT BASIN OR (CUSTOM PROCEDURE TRAY) ×3 IMPLANT
KIT SUCTION CATH 14FR (SUCTIONS) ×3 IMPLANT
KIT TURNOVER KIT B (KITS) ×3 IMPLANT
KIT VASOVIEW HEMOPRO 2 VH 4000 (KITS) ×3 IMPLANT
LEAD PACING MYOCARDI (MISCELLANEOUS) ×3 IMPLANT
MARKER GRAFT CORONARY BYPASS (MISCELLANEOUS) ×9 IMPLANT
NEEDLE 18GX1X1/2 (RX/OR ONLY) (NEEDLE) ×9 IMPLANT
NEEDLE 22X1 1/2 (OR ONLY) (NEEDLE) ×3 IMPLANT
NS IRRIG 1000ML POUR BTL (IV SOLUTION) ×15 IMPLANT
PACK E OPEN HEART (SUTURE) ×3 IMPLANT
PACK OPEN HEART (CUSTOM PROCEDURE TRAY) ×3 IMPLANT
PACK SPY-PHI (KITS) ×6 IMPLANT
PAD ARMBOARD 7.5X6 YLW CONV (MISCELLANEOUS) ×6 IMPLANT
PAD ELECT DEFIB RADIOL ZOLL (MISCELLANEOUS) ×3 IMPLANT
PENCIL BUTTON HOLSTER BLD 10FT (ELECTRODE) ×6 IMPLANT
PLATE STERNAL 2.3X208 14H 2-PK (Plate) ×3 IMPLANT
POSITIONER HEAD DONUT 9IN (MISCELLANEOUS) ×3 IMPLANT
POWDER SURGICEL 3.0 GRAM (HEMOSTASIS) ×3 IMPLANT
PUNCH AORTIC ROTATE  4.5MM 8IN (MISCELLANEOUS) ×3 IMPLANT
SCREW LOCKING TI 2.3X11MM (Screw) ×21 IMPLANT
SCREW LOCKING TI 2.3X13MM (Screw) ×3 IMPLANT
SCREW STERNAL 2.3X17MM (Screw) ×6 IMPLANT
SCREW STERNAL LOCK 2.3MM (Screw) ×6 IMPLANT
SEALANT SURG COSEAL 4ML (VASCULAR PRODUCTS) ×3 IMPLANT
SET CARDIOPLEGIA MPS 5001102 (MISCELLANEOUS) ×3 IMPLANT
SPONGE LAP 18X18 X RAY DECT (DISPOSABLE) ×6 IMPLANT
SUT BONE WAX W31G (SUTURE) ×3 IMPLANT
SUT MNCRL AB 3-0 PS2 18 (SUTURE) ×6 IMPLANT
SUT MNCRL AB 4-0 PS2 18 (SUTURE) ×3 IMPLANT
SUT PDS AB 1 CTX 36 (SUTURE) ×6 IMPLANT
SUT PROLENE 3 0 SH DA (SUTURE) ×3 IMPLANT
SUT PROLENE 4 0 RB 1 (SUTURE) ×2
SUT PROLENE 4 0 SH DA (SUTURE) ×3 IMPLANT
SUT PROLENE 4-0 RB1 .5 CRCL 36 (SUTURE) ×4 IMPLANT
SUT PROLENE 5 0 C 1 36 (SUTURE) IMPLANT
SUT PROLENE 6 0 C 1 30 (SUTURE) ×12 IMPLANT
SUT PROLENE 8 0 BV175 6 (SUTURE) ×3 IMPLANT
SUT PROLENE BLUE 7 0 (SUTURE) ×3 IMPLANT
SUT SILK  1 MH (SUTURE) ×2
SUT SILK 1 MH (SUTURE) ×4 IMPLANT
SUT SILK 2 0 SH CR/8 (SUTURE) IMPLANT
SUT SILK 3 0 SH CR/8 (SUTURE) IMPLANT
SUT STEEL 6MS V (SUTURE) ×3 IMPLANT
SUT STEEL SZ 6 DBL 3X14 BALL (SUTURE) ×3 IMPLANT
SUT VIC AB 2-0 CT1 27 (SUTURE) ×1
SUT VIC AB 2-0 CT1 TAPERPNT 27 (SUTURE) ×2 IMPLANT
SUT VIC AB 2-0 CTX 27 (SUTURE) IMPLANT
SUT VIC AB 3-0 X1 27 (SUTURE) IMPLANT
SYR 10ML LL (SYRINGE) ×3 IMPLANT
SYR 20ML LL LF (SYRINGE) ×3 IMPLANT
SYR 30ML LL (SYRINGE) IMPLANT
SYR 3ML LL SCALE MARK (SYRINGE) ×3 IMPLANT
SYSTEM SAHARA CHEST DRAIN ATS (WOUND CARE) ×3 IMPLANT
TAPE CLOTH SURG 4X10 WHT LF (GAUZE/BANDAGES/DRESSINGS) ×3 IMPLANT
TAPE PAPER 2X10 WHT MICROPORE (GAUZE/BANDAGES/DRESSINGS) ×3 IMPLANT
TOWEL GREEN STERILE (TOWEL DISPOSABLE) ×3 IMPLANT
TOWEL GREEN STERILE FF (TOWEL DISPOSABLE) ×3 IMPLANT
TRAY FOLEY SLVR 16FR TEMP STAT (SET/KITS/TRAYS/PACK) ×3 IMPLANT
TUBING ART PRESS 48 MALE/FEM (TUBING) ×6 IMPLANT
TUBING LAP HI FLOW INSUFFLATIO (TUBING) ×3 IMPLANT
UNDERPAD 30X30 (UNDERPADS AND DIAPERS) ×3 IMPLANT
WATER STERILE IRR 1000ML POUR (IV SOLUTION) ×6 IMPLANT
WATER STERILE IRR 1000ML UROMA (IV SOLUTION) ×3 IMPLANT

## 2018-12-12 SURGICAL SUPPLY — 51 items
CANISTER SUCT 3000ML PPV (MISCELLANEOUS) ×3 IMPLANT
CANNULA NON VENT 18FR 12 (CANNULA) ×3 IMPLANT
CATH CPB KIT HENDRICKSON (MISCELLANEOUS) ×3 IMPLANT
CATH ROBINSON RED A/P 18FR (CATHETERS) ×6 IMPLANT
DERMABOND ADVANCED (GAUZE/BANDAGES/DRESSINGS) ×1
DERMABOND ADVANCED .7 DNX12 (GAUZE/BANDAGES/DRESSINGS) ×2 IMPLANT
DRAIN CHANNEL 28F RND 3/8 FF (WOUND CARE) ×9 IMPLANT
DRAPE CARDIOVASCULAR INCISE (DRAPES) ×1
DRAPE SLUSH/WARMER DISC (DRAPES) ×3 IMPLANT
DRAPE SRG 135X102X78XABS (DRAPES) ×2 IMPLANT
DRSG AQUACEL AG ADV 3.5X14 (GAUZE/BANDAGES/DRESSINGS) ×3 IMPLANT
ELECT CAUTERY BLADE 6.4 (BLADE) ×3 IMPLANT
ELECT REM PT RETURN 9FT ADLT (ELECTROSURGICAL) ×6
ELECTRODE REM PT RTRN 9FT ADLT (ELECTROSURGICAL) ×4 IMPLANT
FELT TEFLON 1X6 (MISCELLANEOUS) ×3 IMPLANT
GAUZE SPONGE 4X4 12PLY STRL (GAUZE/BANDAGES/DRESSINGS) ×3 IMPLANT
GLOVE BIO SURGEON STRL SZ 6 (GLOVE) ×6 IMPLANT
GLOVE BIO SURGEON STRL SZ 6.5 (GLOVE) ×6 IMPLANT
GLOVE NEODERM STRL 7.5 LF PF (GLOVE) ×4 IMPLANT
GLOVE SURG NEODERM 7.5  LF PF (GLOVE) ×2
GOWN STRL REUS W/ TWL LRG LVL3 (GOWN DISPOSABLE) ×8 IMPLANT
GOWN STRL REUS W/TWL LRG LVL3 (GOWN DISPOSABLE) ×4
INSERT FOGARTY XLG (MISCELLANEOUS) ×3 IMPLANT
KIT BASIN OR (CUSTOM PROCEDURE TRAY) ×3 IMPLANT
KIT PLEURX DRAIN PLEURAL 1 LTR (MISCELLANEOUS) ×3 IMPLANT
KIT SUCTION CATH 14FR (SUCTIONS) ×3 IMPLANT
KIT TURNOVER KIT B (KITS) ×3 IMPLANT
NEEDLE 18GX1X1/2 (RX/OR ONLY) (NEEDLE) ×3 IMPLANT
NS IRRIG 1000ML POUR BTL (IV SOLUTION) ×15 IMPLANT
PACK E OPEN HEART (SUTURE) ×3 IMPLANT
PACK OPEN HEART (CUSTOM PROCEDURE TRAY) ×3 IMPLANT
PENCIL BUTTON HOLSTER BLD 10FT (ELECTRODE) ×3 IMPLANT
POSITIONER HEAD DONUT 9IN (MISCELLANEOUS) ×3 IMPLANT
SEALANT SURG COSEAL 4ML (VASCULAR PRODUCTS) ×3 IMPLANT
SET CARDIOPLEGIA MPS 5001102 (MISCELLANEOUS) ×3 IMPLANT
SPONGE LAP 18X18 X RAY DECT (DISPOSABLE) ×3 IMPLANT
STAPLER VISISTAT 35W (STAPLE) ×3 IMPLANT
SUT BONE WAX W31G (SUTURE) ×3 IMPLANT
SUT MNCRL AB 3-0 PS2 18 (SUTURE) ×6 IMPLANT
SUT PDS AB 1 CTX 36 (SUTURE) ×9 IMPLANT
SUT PROLENE 3 0 SH DA (SUTURE) ×3 IMPLANT
SUT PROLENE 6 0 C 1 30 (SUTURE) ×9 IMPLANT
SUT PROLENE BLUE 7 0 (SUTURE) ×3 IMPLANT
SUT STEEL 6MS V (SUTURE) ×3 IMPLANT
SUT STEEL SZ 6 DBL 3X14 BALL (SUTURE) ×3 IMPLANT
SYR 30ML LL (SYRINGE) ×3 IMPLANT
SYR 3ML LL SCALE MARK (SYRINGE) ×3 IMPLANT
TOWEL GREEN STERILE FF (TOWEL DISPOSABLE) ×3 IMPLANT
TUBING LAP HI FLOW INSUFFLATIO (TUBING) ×3 IMPLANT
UNDERPAD 30X30 (UNDERPADS AND DIAPERS) ×3 IMPLANT
WATER STERILE IRR 1000ML POUR (IV SOLUTION) ×6 IMPLANT

## 2018-12-12 NOTE — Op Note (Signed)
CARDIOTHORACIC SURGERY OPERATIVE NOTE  Date of Procedure: 12/12/2018  Preoperative Diagnosis: Severe 3-vessel Coronary Artery Disease with depressed LV EF and systolic heart failure  Postoperative Diagnosis: Same  Procedure:    Coronary Artery Bypass Grafting x 4   Left Internal Mammary Artery to Distal Left Anterior Descending Coronary Artery; pedicled RIMA Graft to  Posterior Descending Coronary Artery; Saphenous Vein Graft to 1st Obtuse Marginal Branch of Left Circumflex Coronary Artery; Sapheonous Vein Graft to acute margional Branch Right Coronary Artery; Endoscopic Vein Harvest from right Thigh;  Bilateral IMA harvesting Completion graft surveillance with indocyanine green fluorescence imaging (SPY) Rigid sternal reconstruction with linear plating system (KLS)  Surgeon: B. Murvin Natal, MD  Assistant: Josie Saunders PA-C  Anesthesia: GET  Operative Findings:  depressed left ventricular systolic function  Good quality internal mammary artery conduits  Good quality saphenous vein conduit  good quality target vessels for grafting    BRIEF CLINICAL NOTE AND INDICATIONS FOR SURGERY 63 yo man with brief history of heart failure medically managed. Work-up included LHC showing severe 3V cad and depressed EF. Presents for CABG   DETAILS OF THE OPERATIVE PROCEDURE  Preparation:  The patient is brought to the operating room on the above mentioned date and central monitoring was established by the anesthesia team including placement of Swan-Ganz catheter and radial arterial line. The patient is placed in the supine position on the operating table.  Intravenous antibiotics are administered. General endotracheal anesthesia is induced uneventfully. A Foley catheter is placed.  Baseline transesophageal echocardiogram was performed.  Findings were notable for reduced LV function, normal RV function, and no significant valvular disease  The patient's chest, abdomen, both groins, and  both lower extremities are prepared and draped in a sterile manner. A time out procedure is performed.   Surgical Approach and Conduit Harvest:  A median sternotomy incision was performed and the left internal mammary artery is dissected from the chest wall and prepared for bypass grafting. The left internal mammary artery is notably good quality conduit. Simultaneously, the greater saphenous vein is obtained from the patient's right thigh using endoscopic vein harvest technique. The saphenous vein is notably good quality conduit. After removal of the saphenous vein, the small surgical incisions in the lower extremity are closed with absorbable suture. Next, attention is turned to the right chest wall where the RIMA is harvested. When complete, full heparinization is achieved. Following systemic heparinization, both internal mammary arteries were transected distally noted to have excellent flow.   Extracorporeal Cardiopulmonary Bypass and Myocardial Protection:  The pericardium is opened. The ascending aorta is normal in appearance. The ascending aorta and the right atrium are cannulated for cardiopulmonary bypass.  Adequate heparinization is verified.   A retrograde cardioplegia cannula is placed through the right atrium into the coronary sinus.  The entire pre-bypass portion of the operation was notable for stable hemodynamics.  Cardiopulmonary bypass was begun and the surface of the heart is inspected. Distal target vessels are selected for coronary artery bypass grafting. A cardioplegia cannula is placed in the ascending aorta.  The patient is allowed to cool passively to 34 C systemic temperature.  The aortic cross clamp is applied and cold blood cardioplegia is delivered initially in an antegrade fashion through the aortic root. Supplemental cardioplegia is given retrograde through the coronary sinus catheter.  Iced saline slush is applied for topical hypothermia.  The initial cardioplegic  arrest is rapid with early diastolic arrest.  Repeat doses of cardioplegia are administered intermittently throughout the  entire cross clamp portion of the operation through the aortic root, through the coronary sinus catheter, and through subsequently placed vein grafts in order to maintain completely flat electrocardiogram. Myocardial protection was felt to be excellent.   Coronary Artery Bypass Grafting:   The 1st obtuse marginal branch of the left circumflex coronary artery was grafted using a reversed saphenous vein graft in an end-to-side fashion.  At the site of distal anastomosis the target vessel was good quality and measured approximately 2 mm in diameter. Anastomotic patency and runoff was confirmed with indocyanine green fluorescence imaging (SPY).  The distal left anterior coronary artery was grafted with the left internal mammary artery in an end-to-side fashion.  At the site of distal anastomosis the target vessel was good quality and measured approximately 2 mm in diameter. Anastomotic patency and runoff was confirmed with indocyanine green fluorescence imaging (SPY).  The  posterior descending branch of the right coronary artery was grafted using a reversed saphenous vein graft in an end-to-side fashion.  At the site of distal anastomosis the target vessel was good quality and measured approximately 1.5 mm in diameter. Anastomotic patency and runoff was confirmed with indocyanine green fluorescence imaging (SPY).  The acute marginal branch of the right coronary artery was grafted in an end-to-side fashion using reversed saphenous vein graft. At the site of distal anastomosis the target vessel was good quality and measured approximately 1.5 mm in diameter.   All proximal vein graft anastomoses were placed directly to the ascending aorta prior to removal of the aortic cross clamp.  The septal myocardial temperature rose rapidly after reperfusion of the left internal mammary artery  graft.  The aortic cross clamp was removed after a total cross clamp time of 80 minutes.   Procedure Completion:  All proximal and distal coronary anastomoses were inspected for hemostasis and appropriate graft orientation. Epicardial pacing wires are fixed to the right ventricular outflow tract and to the right atrial appendage. The patient is rewarmed to 37C temperature. The patient is weaned and disconnected from cardiopulmonary bypass.  The patient's rhythm at separation from bypass was sinus bradycardia.  The patient was weaned from cardiopulmonary bypass with moderate inotropic support. Total cardiopulmonary bypass time for the operation was 121 minutes.  Followup transesophageal echocardiogram performed after separation from bypass revealed  no changes from the preoperative exam.  The aortic and venous cannula were removed uneventfully. Protamine was administered to reverse the anticoagulation. The mediastinum and pleural space were inspected for hemostasis and irrigated with saline solution. The mediastinum and bilateral pleural space were drained using 28 Fr fluted chest tubes placed through separate stab incisions inferiorly.  The soft tissues anterior to the aorta were reapproximated loosely. The sternum is closed in a rigid sternal fashion with linear plating system and double strength sternal wire. The soft tissues anterior to the sternum were closed in multiple layers and the skin is closed with a running subcuticular skin closure.  The post-bypass portion of the operation was notable for stable rhythm and hemodynamics.  No blood products were administered during the operation.   Disposition:  The patient tolerated the procedure well and is transported to the surgical intensive care in stable condition. There are no intraoperative complications. All sponge instrument and needle counts are verified correct at completion of the operation.    Jayme Cloud, MD 12/12/2018 2:24  PM

## 2018-12-12 NOTE — Anesthesia Procedure Notes (Signed)
Central Venous Catheter Insertion Performed by: Albertha Ghee, MD, anesthesiologist Start/End11/18/2020 8:02 AM, 12/12/2018 8:16 AM Patient location: Pre-op. Preanesthetic checklist: patient identified, IV checked, site marked, risks and benefits discussed, surgical consent, monitors and equipment checked, pre-op evaluation, timeout performed and anesthesia consent Position: Trendelenburg Lidocaine 1% used for infiltration and patient sedated Hand hygiene performed , maximum sterile barriers used  and Seldinger technique used Catheter size: 9 Fr Central line was placed.MAC introducer Swan type:thermodilation Procedure performed using ultrasound guided technique. Ultrasound Notes:anatomy identified, needle tip was noted to be adjacent to the nerve/plexus identified, no ultrasound evidence of intravascular and/or intraneural injection and image(s) printed for medical record Attempts: 1 Following insertion, line sutured, dressing applied and Biopatch. Post procedure assessment: blood return through all ports, free fluid flow and no air  Patient tolerated the procedure well with no immediate complications.

## 2018-12-12 NOTE — Brief Op Note (Signed)
12/12/2018  9:52 PM  PATIENT:  Frank Love  63 y.o. male  PRE-OPERATIVE DIAGNOSIS:  bleeding status post CABG  POST-OPERATIVE DIAGNOSIS:  Same with focus from Latty to PDA anastomosis  PROCEDURE:  Procedure(s): Mediastinal Re-Exploration after bypass graft (N/A)  Control of bleeding on cardipulmonary bypass (no ischemic time; no cross clamp)  SURGEON:  Surgeon(s) and Role:    * [name], MD - Primary  PHYSICIAN ASSISTANT:   ASSISTANTS: staff   ANESTHESIA:   general  EBL:  800 mL   BLOOD ADMINISTERED: see perfusion record  DRAINS: 3 Chest Tube(s) in the mediastinum and bilateral pleural spaces   LOCAL MEDICATIONS USED:  NONE  SPECIMEN:  No Specimen  DISPOSITION OF SPECIMEN:  N/A  COUNTS:  YES  TOURNIQUET:  * No tourniquets in log *  DICTATION: .Note written in EPIC  PLAN OF CARE: Admit to inpatient   PATIENT DISPOSITION:  ICU - intubated and critically ill.   Delay start of Pharmacological VTE agent (>24hrs) due to surgical blood loss or risk of bleeding: yes  Kristinia Leavy Z. Orvan Seen, [place]

## 2018-12-12 NOTE — Progress Notes (Signed)
  Echocardiogram Echocardiogram Transesophageal has been performed.  Frank Love 12/12/2018, 9:55 AM

## 2018-12-12 NOTE — Transfer of Care (Signed)
Immediate Anesthesia Transfer of Care Note  Patient: Frank Love  Procedure(s) Performed: Mediastinal Re-Exploration after bypass graft (N/A Chest)  Patient Location: SICU  Anesthesia Type:General  Level of Consciousness: Patient remains intubated per anesthesia plan  Airway & Oxygen Therapy: Patient placed on Ventilator (see vital sign flow sheet for setting)  Post-op Assessment: Report given to RN and Post -op Vital signs reviewed and stable  Post vital signs: Reviewed and stable  Last Vitals:  Vitals Value Taken Time  BP    Temp 37.3 C 12/12/18 2153  Pulse 80 12/12/18 2153  Resp 12 12/12/18 2153  SpO2 96 % 12/12/18 2153  Vitals shown include unvalidated device data.  Last Pain:  Vitals:   12/12/18 0719  TempSrc: Oral  PainSc:       Patients Stated Pain Goal: 3 ([id number])  Complications: No apparent anesthesia complications

## 2018-12-12 NOTE — Progress Notes (Signed)
T[facility] Evening Rounds  The patient has had excessive chest tube output since arrival despite 2 FFP, 2 PRBC, 2 platelets and 2 cryoprecipitate. Suggest return to OR. Discussed with wife, who agrees. Frank Love Z. Orvan Seen, [place]

## 2018-12-12 NOTE — Transfer of Care (Signed)
Immediate Anesthesia Transfer of Care Note  Patient: Frank Love  Procedure(s) Performed: CORONARY ARTERY BYPASS GRAFTING (CABG) x four, using bilateral internal mammary arteries and right leg greater saphenous vein harvested endoscopically (N/A Chest) INDOCYANINE GREEN FLUORESCENCE IMAGING (ICG) (N/A ) TRANSESOPHAGEAL ECHOCARDIOGRAM (TEE) (N/A )  Patient Location: SICU  Anesthesia Type:General  Level of Consciousness: sedated and unresponsive  Airway & Oxygen Therapy: Patient remains intubated per anesthesia plan and Patient placed on Ventilator (see vital sign flow sheet for setting)  Post-op Assessment: Report given to RN and Post -op Vital signs reviewed and stable  Post vital signs: Reviewed and stable  Last Vitals:  Vitals Value Taken Time  BP    Temp    Pulse    Resp    SpO2      Last Pain:  Vitals:   12/12/18 0719  TempSrc: Oral  PainSc:       Patients Stated Pain Goal: 3 ([id number])  Complications: No apparent anesthesia complications

## 2018-12-12 NOTE — Brief Op Note (Addendum)
12/12/2018  12:46 PM  PATIENT:  Frank Love  63 y.o. male  PRE-OPERATIVE DIAGNOSIS:  Coronary Artery Disease  POST-OPERATIVE DIAGNOSIS:  Coronary Artery Disease  PROCEDURE:  TRANSESOPHAGEAL ECHOCARDIOGRAM (TEE), MEDIAN STERNOTOMY for CORONARY ARTERY BYPASS GRAFTING (CABG) x 4 (LIMA to LAD, SVG to OM, SVG to ACUTE MARGINAL, RIMA to PDA)  using bilateral internal mammary arteries and right thigh greater saphenous vein harvested endoscopically, INDOCYANINE GREEN FLUORESCENCE IMAGING (ICG)   SURGEON:  Surgeon(s) and Role:    [name], MD - Primary  PHYSICIAN ASSISTANT: Lars Pinks PA-C  ASSISTANTS: Dineen Kid RNFA  ANESTHESIA:   general  EBL:  Per anesthesia and perfusion record  DRAINS: Chest tubes placed in the mediastinal and pleural spaces   LOCAL MEDICATIONS USED:  OTHER Exparel  COUNTS CORRECT:  YES  DICTATION: .Dragon Dictation  PLAN OF CARE: Admit to inpatient   PATIENT DISPOSITION:  ICU - intubated and hemodynamically stable.   Delay start of Pharmacological VTE agent (>24hrs) due to surgical blood loss or risk of bleeding: yes  BASELINE WEIGHT: 105.2 kg  Agree with B.O.N.  Frank Love, Frank Love

## 2018-12-12 NOTE — Anesthesia Preprocedure Evaluation (Signed)
Anesthesia Evaluation  Patient identified by MRN, date of birth, ID band Patient awake    Reviewed: Allergy & Precautions, H&P , NPO status , Patient's Chart, lab work & pertinent test results  Airway Mallampati: II   Neck ROM: full    Dental   Pulmonary neg pulmonary ROS,    breath sounds clear to auscultation       Cardiovascular hypertension, + CAD, + Past MI and +CHF   Rhythm:regular Rate:Normal  EF 30-35%   Neuro/Psych    GI/Hepatic   Endo/Other    Renal/GU      Musculoskeletal   Abdominal   Peds  Hematology   Anesthesia Other Findings   Reproductive/Obstetrics                             Anesthesia Physical Anesthesia Plan  ASA: III  Anesthesia Plan: General   Post-op Pain Management:    Induction: Intravenous  PONV Risk Score and Plan: 2 and Ondansetron, Dexamethasone, Midazolam and Treatment may vary due to age or medical condition  Airway Management Planned: Oral ETT  Additional Equipment: Arterial line, CVP, PA Cath, TEE and Ultrasound Guidance Line Placement  Intra-op Plan:   Post-operative Plan: Post-operative intubation/ventilation  Informed Consent: I have reviewed the patients History and Physical, chart, labs and discussed the procedure including the risks, benefits and alternatives for the proposed anesthesia with the patient or authorized representative who has indicated his/her understanding and acceptance.       Plan Discussed with: CRNA, Anesthesiologist and Surgeon  Anesthesia Plan Comments:         Anesthesia Quick Evaluation

## 2018-12-12 NOTE — Anesthesia Procedure Notes (Signed)
Procedure Name: Intubation Date/Time: 12/12/2018 9:02 AM Performed by: Mariea Clonts, CRNA Pre-anesthesia Checklist: Patient identified, Emergency Drugs available, Suction available and Patient being monitored Patient Re-evaluated:Patient Re-evaluated prior to induction Oxygen Delivery Method: Circle System Utilized Preoxygenation: Pre-oxygenation with 100% oxygen Induction Type: IV induction Ventilation: Mask ventilation without difficulty Laryngoscope Size: Mac and 4 Grade View: Grade I Tube type: Oral Tube size: 8.0 mm Number of attempts: 1 Airway Equipment and Method: Stylet and Oral airway Placement Confirmation: ETT inserted through vocal cords under direct vision,  positive ETCO2 and breath sounds checked- equal and bilateral Tube secured with: Tape Dental Injury: Teeth and Oropharynx as per pre-operative assessment

## 2018-12-12 NOTE — Anesthesia Preprocedure Evaluation (Signed)
Anesthesia Evaluation  Patient identified by MRN, date of birth, ID band Patient unresponsive    Reviewed: Patient's Chart, lab work & pertinent test resultsPreop documentation limited or incomplete due to emergent nature of procedure.  Airway Mallampati: Intubated       Dental   Pulmonary    + rhonchi        Cardiovascular hypertension,  Rhythm:Regular Rate:Normal     Neuro/Psych    GI/Hepatic   Endo/Other    Renal/GU      Musculoskeletal   Abdominal   Peds  Hematology   Anesthesia Other Findings   Reproductive/Obstetrics                             Anesthesia Physical Anesthesia Plan  ASA: IV and emergent  Anesthesia Plan: General   Post-op Pain Management:    Induction: Intravenous  PONV Risk Score and Plan:   Airway Management Planned: Oral ETT  Additional Equipment: Arterial line, CVP and PA Cath  Intra-op Plan:   Post-operative Plan: Post-operative intubation/ventilation  Informed Consent: I have reviewed the patients History and Physical, chart, labs and discussed the procedure including the risks, benefits and alternatives for the proposed anesthesia with the patient or authorized representative who has indicated his/her understanding and acceptance.       Plan Discussed with: CRNA and Anesthesiologist  Anesthesia Plan Comments:         Anesthesia Quick Evaluation

## 2018-12-12 NOTE — Anesthesia Procedure Notes (Signed)
Arterial Line Insertion Start/End11/18/2020 7:35 AM, 12/12/2018 7:40 AM Performed by: CRNA  Patient location: Pre-op. Preanesthetic checklist: patient identified, IV checked, site marked, risks and benefits discussed, surgical consent, monitors and equipment checked, pre-op evaluation, timeout performed and anesthesia consent Lidocaine 1% used for infiltration Right, radial was placed Catheter size: 20 G Hand hygiene performed  and maximum sterile barriers used   Attempts: 2 Procedure performed without using ultrasound guided technique. Following insertion, Biopatch and dressing applied. Post procedure assessment: normal  Patient tolerated the procedure well with no immediate complications.

## 2018-12-12 NOTE — Progress Notes (Signed)
  Amiodarone Drug - Drug Interaction Consult Note  Recommendations: Monitor HR on metoprolol; monitor QTc on ondansetron (consider alternative anti-emetic)  Amiodarone is metabolized by the cytochrome P450 system and therefore has the potential to cause many drug interactions. Amiodarone has an average plasma half-life of 50 days (range 20 to 100 days).   There is potential for drug interactions to occur several weeks or months after stopping treatment and the onset of drug interactions may be slow after initiating amiodarone.   []  Statins: Increased risk of myopathy. Simvastatin- restrict dose to 20mg  daily. Other statins: counsel patients to report any muscle pain or weakness immediately.  []  Anticoagulants: Amiodarone can increase anticoagulant effect. Consider warfarin dose reduction. Patients should be monitored closely and the dose of anticoagulant altered accordingly, remembering that amiodarone levels take several weeks to stabilize.  []  Antiepileptics: Amiodarone can increase plasma concentration of phenytoin, the dose should be reduced. Note that small changes in phenytoin dose can result in large changes in levels. Monitor patient and counsel on signs of toxicity.  [x]  Beta blockers: increased risk of bradycardia, AV block and myocardial depression. Sotalol - avoid concomitant use.  []   Calcium channel blockers (diltiazem and verapamil): increased risk of bradycardia, AV block and myocardial depression.  []   Cyclosporine: Amiodarone increases levels of cyclosporine. Reduced dose of cyclosporine is recommended.  []  Digoxin dose should be halved when amiodarone is started.  []  Diuretics: increased risk of cardiotoxicity if hypokalemia occurs.  []  Oral hypoglycemic agents (glyburide, glipizide, glimepiride): increased risk of hypoglycemia. Patient's glucose levels should be monitored closely when initiating amiodarone therapy.   [x]  Drugs that prolong the QT interval:  Torsades de  pointes risk may be increased with concurrent use - avoid if possible.  Monitor QTc, also keep magnesium/potassium WNL if concurrent therapy can't be avoided. Marland Kitchen Antibiotics: e.g. fluoroquinolones, erythromycin. . Antiarrhythmics: e.g. quinidine, procainamide, disopyramide, sotalol. . Antipsychotics: e.g. phenothiazines, haloperidol.  . Lithium, tricyclic antidepressants, and methadone.  Ondansetron  Gillermina Hu, PharmD, BCPS, [facility] Clinical Pharmacist 12/12/2018 4:21 PM

## 2018-12-12 NOTE — H&P (Signed)
History and Physical Interval Note:  12/12/2018 8:29 AM  Frank Love  has presented today for surgery, with the diagnosis of Coronary Artery Disease.  The various methods of treatment have been discussed with the patient and family. After consideration of risks, benefits and other options for treatment, the patient has consented to  Procedure(s): CORONARY ARTERY BYPASS GRAFTING (CABG) (N/A) INDOCYANINE GREEN FLUORESCENCE IMAGING (ICG) (N/A) TRANSESOPHAGEAL ECHOCARDIOGRAM (TEE) (N/A) as a surgical intervention.  The patient's history has been reviewed, patient examined, no change in status, stable for surgery.  I have reviewed the patient's chart and labs.  Questions were answered to the patient's satisfaction.     [address]       [place] [address]             [phone]     CARDIOTHORACIC SURGERY CONSULTATION REPORT  Referring Provider is No ref. provider found Primary Cardiologist is Frank Breeding, MD PCP is Frank Pounds, NP  No chief complaint on file.   HPI:  63 yo man was in his USOH until approximately 2 mos ago when he was noted to have increased, unexplained LE edema. Labs showed elevated BNP suggesting CHF. He has been medically managed with relief of sx. In the meantime, work-up of CHF by coronary calcium CT scan, echo, and left heart catheterization shows severe multivessel CAD and depressed LV function. He was otherwise scheduled to undergo hip replacement but this is now deferred. Denies angina or angina equivalent; denies orthopnea or cough.   Past Medical History:  Diagnosis Date  . Chronic pain of right knee   . Coronary artery disease   . Hypertension   . Myocardial infarction [facility])    possible in the past was told by a doctor but no one has ever said anything else  . Phimosis     Past Surgical History:  Procedure Laterality Date  . CIRCUMCISION N/A 10/12/2018   Procedure: CIRCUMCISION ADULT;   Surgeon: Franchot Gallo, MD;  Location: [facility];  Service: Urology;  Laterality: N/A;  45 mins  . COLONOSCOPY    . LEFT HEART CATH AND CORONARY ANGIOGRAPHY N/A 11/28/2018   Procedure: LEFT HEART CATH AND CORONARY ANGIOGRAPHY;  Surgeon: Burnell Blanks, MD;  Location: [facility];  Service: Cardiovascular;  Laterality: N/A;  . TOOTH EXTRACTION      Family History  Problem Relation Age of Onset  . Diabetes Mother   . Heart disease Mother     Social History   Socioeconomic History  . Marital status: Married    Spouse name: Not on file  . Number of children: Not on file  . Years of education: Not on file  . Highest education level: Not on file  Occupational History  . Not on file  Social Needs  . Financial resource strain: Not on file  . Food insecurity    Worry: Not on file    Inability: Not on file  . Transportation needs    Medical: Not on file    Non-medical: Not on file  Tobacco Use  . Smoking status: Never Smoker  . Smokeless tobacco: Never Used  Substance and Sexual Activity  . Alcohol use: No    Alcohol/week: 0.0 standard drinks  . Drug use: No  . Sexual activity: Yes  Lifestyle  . Physical activity    Days per week: Not on file    Minutes per session: Not  on file  . Stress: Not on file  Relationships  . Social Herbalist on phone: Not on file    Gets together: Not on file    Attends religious service: Not on file    Active member of club or organization: Not on file    Attends meetings of clubs or organizations: Not on file    Relationship status: Not on file  . Intimate partner violence    Fear of current or ex partner: Not on file    Emotionally abused: Not on file    Physically abused: Not on file    Forced sexual activity: Not on file  Other Topics Concern  . Not on file  Social History Narrative   Lives with wife.      Current Facility-Administered Medications  Medication Dose Route Frequency Provider Last Rate Last  Dose  . cefUROXime (ZINACEF) 1.5 g in sodium chloride 0.9 % 100 mL IVPB  1.5 g Intravenous To OR Gabriela Giannelli Z, MD      . cefUROXime (ZINACEF) 750 mg in sodium chloride 0.9 % 100 mL IVPB  750 mg Intravenous To OR Machelle Raybon, Glenice Bow, MD      . chlorhexidine (HIBICLENS) 4 % liquid 2 application  30 mL Topical UD Youssef Footman Z, MD      . dexmedetomidine (PRECEDEX) 400 MCG/100ML (4 mcg/mL) infusion  0.1-0.7 mcg/kg/hr Intravenous To OR Jaiyanna Safran, Glenice Bow, MD      . DOPamine (INTROPIN) 800 mg in dextrose 5 % 250 mL (3.2 mg/mL) infusion  0-10 mcg/kg/min Intravenous To OR Kween Bacorn Z, MD      . EPINEPHrine (ADRENALIN) 4 mg in NS 250 mL (0.016 mg/mL) premix infusion  0-10 mcg/min Intravenous To OR Benna Arno Z, MD      . heparin 2,500 Units, papaverine 30 mg in electrolyte-148 (PLASMALYTE-148) 500 mL irrigation   Irrigation To OR Baya Lentz Z, MD      . heparin 30,000 units/NS 1000 mL solution for CELLSAVER   Other To OR Cortney Beissel, Glenice Bow, MD      . insulin regular, human (MYXREDLIN) 100 units/ 100 mL infusion   Intravenous To OR Autry Prust Z, MD      . magnesium sulfate (IV Push/IM) injection 40 mEq  40 mEq Other To OR Dicky Boer, Glenice Bow, MD      . metoprolol tartrate (LOPRESSOR) tablet 12.5 mg  12.5 mg Oral Once Tichina Koebel, Glenice Bow, MD      . milrinone (PRIMACOR) 20 MG/100 ML (0.2 mg/mL) infusion  0.3 mcg/kg/min Intravenous To OR Kassadee Carawan, Glenice Bow, MD      . mupirocin ointment (BACTROBAN) 2 % 1 application  1 application Topical BID [name], MD   1 application at [phone]  . nitroGLYCERIN 50 mg in dextrose 5 % 250 mL (0.2 mg/mL) infusion  2-200 mcg/min Intravenous To OR Mykaylah Ballman, Glenice Bow, MD      . norepinephrine (LEVOPHED) 4mg  in 233mL premix infusion  0-40 mcg/min Intravenous To OR Cindy Brindisi Z, MD      . phenylephrine (NEOSYNEPHRINE) 20-0.9 MG/250ML-% infusion  30-200 mcg/min Intravenous To OR Iridian Reader Z, MD      . potassium chloride injection 80 mEq   80 mEq Other To OR Diona Peregoy, Glenice Bow, MD      . tranexamic acid (CYKLOKAPRON) 2,500 mg in sodium chloride 0.9 % 250 mL (10 mg/mL) infusion  1.5 mg/kg/hr Intravenous To OR Orvan Seen, Glenice Bow, MD      .  tranexamic acid (CYKLOKAPRON) bolus via infusion - over 30 minutes 1,581 mg  15 mg/kg Intravenous To OR Gladyes Kudo, Glenice Bow, MD      . tranexamic acid (CYKLOKAPRON) pump prime solution 211 mg  2 mg/kg Intracatheter To OR Tayveon Lombardo, Glenice Bow, MD      . vancomycin (VANCOCIN) 1,000 mg in sodium chloride 0.9 % 1,000 mL irrigation   Irrigation To OR  Shellhammer, Glenice Bow, MD      . vancomycin (VANCOCIN) 1,500 mg in sodium chloride 0.9 % 250 mL IVPB  1,500 mg Intravenous To OR Alexsander Cavins, Glenice Bow, MD        No Known Allergies    Review of Systems:   General:  No change appetite, mildly reduced energy, + weight loss--intentional, denies fever  Cardiac:  As per HPI  Respiratory:  negative shortness of breath, negative productive cough,  GI:   Denies sx  GU:   H/o recent circumcision  Vascular:  no pain suggestive of claudication,  no varicose veins/ DVT, Neuro:   No stroke/ TIA's/ seizures/ headaches,  Musculoskeletal: + arthritis-- R hip and knee,  Skin:   neg  Psych:   neg  Eyes:   neg  ENT:   neg  Hematologic:  neg  Endocrine:  no diabetes, does not check CBG's at home     Physical Exam:   Temp 98.1 F (36.7 C) (Oral)   Resp 18   Ht 6\' 3"  (1.905 m)   Wt 105.2 kg   SpO2 99%   BMI 29.00 kg/m   General:   well-appearing, NAD   HEENT:  Unremarkable   Neck:   no JVD, no bruits, no adenopathy   Chest:   clear to auscultation, symmetrical breath sounds, no wheezes, no rhonchi   CV:   RRR, no  murmur   Abdomen:  soft, non-tender, no masses   Extremities:  warm, well-perfused, pulses 2+ throughout, no LE edema  Rectal/GU  Deferred  Neuro:   Grossly non-focal and symmetrical throughout  Skin:   Clean and dry, no rashes, no breakdown   Diagnostic Tests:  I have personally reviewed his CT  scans, trans-thoracic echocardiogram, and Left heart catheterization and agree with the interpretations.   Impression:  63 yo man with reduced LV function and multivessel CAD. Suggest surgical revascularization.   Plan:  CABG on 12/12/18 Pre-op testing for risk stratification   I spent in excess of 30 minutes during the conduct of this office consultation and >50% of this time involved direct face-to-face encounter with the patient for counseling and/or coordination of their care.          Level 3 Office Consult = 40 minutes         Level 4 Office Consult = 60 minutes         Level 5 Office Consult = 80 minutes  B. Murvin Natal, MD 12/12/2018 8:30 AM

## 2018-12-12 NOTE — Progress Notes (Signed)
Pt with excessive chest tube output since arrival. Blood products given per MD. 2 FFP, 2 Plasma, 2 cryo, 2 PRBCs given. Blood products infused rapidly due to bleeding and blood pressure issues. MD aware and at bedside.

## 2018-12-12 NOTE — Op Note (Signed)
Procedure(s): Mediastinal Re-Exploration after bypass graft Procedure Note  Frank Love male 63 y.o. 12/12/2018  Procedure(s) and Anesthesia Type:    * Mediastinal Re-Exploration after bypass graft - General  Surgeon(s) and Role:    [name], MD - Primary   Indications: The patient underwent CABG x 4 earlier today and had persistent chest tube output despite appropriate resuscitation. Taken back to OR for re-exploration.     Surgeon: [name]   Assistants: staff  Anesthesia: General endotracheal anesthesia  ASA Class: 4    Procedure Detail  Mediastinal Re-Exploration after bypass graft  After informed consent, he was taken to the OR urgently; there was no period of hemodynamic compromise. He was placed in the supine position on the OR table; anesthesia was confirmed by GET. The anterior chest, abdomen, and groins were cleansed and draped sterilely with betadine solutions. Preoperative pause was performed. The prior sternal wound was reopened and the sternal wires were removed. There was no anterior mediastinal hematoma, but there was a hematoma at the diaphragm surface near the right heart border. Due to this cardiomegaly, he would not tolerate lifting the heart for inspection. Therefore, the decision was made to go on bypass. Full heparinization was adminstered and verified by ACT. The aorta and right atrium were cannulated and he was placed on bypass. This allowed exposure of the LV and in doing so a pinpoint focus of bleeding was identified at the heel of the RIMA to PDA graft. The graft was not under tension. A single suture of 7-0 prolene stopped the bleeding. Full examination of the other portions of the heart was completed without other findings. The patient was weaned from CPB without difficulty. The heart was decannulated; protamine was given to reverse heparin. Chest tubes were returned to the mediastinum and bilateral pleural spaces. The sternum was  reapproximated. The wound was closed in layers. All sponge, instruments and needle counts were correct.     Estimated Blood Loss: 300 Drains: 3 Fluted tubes         Total IV Fluids: per anes record  Blood Given: per anes record         Specimens: none         Implants: none        Complications:  * No complications entered in OR log *         Disposition: to ICU, stable, intubated         Condition: stable  Frank Love Z. Orvan Seen, [name]

## 2018-12-13 ENCOUNTER — Ambulatory Visit: Admit: 2018-12-13 | Payer: Self-pay | Admitting: Urology

## 2018-12-13 ENCOUNTER — Inpatient Hospital Stay (HOSPITAL_COMMUNITY): Payer: No Typology Code available for payment source

## 2018-12-13 ENCOUNTER — Encounter (HOSPITAL_COMMUNITY): Payer: Self-pay | Admitting: Cardiothoracic Surgery

## 2018-12-13 LAB — ECHO INTRAOPERATIVE TEE
Height: 75 in
Weight: 3718.01 oz

## 2018-12-13 LAB — MAGNESIUM
Magnesium: 2.4 mg/dL (ref 1.7–2.4)
Magnesium: 2.3 mg/dL (ref 1.7–2.4)

## 2018-12-13 LAB — BPAM PLATELET PHERESIS
Blood Product Expiration Date: 202011192359
Blood Product Expiration Date: 202011212359
Blood Product Expiration Date: 202011192359
Blood Product Expiration Date: 202011192359
Blood Product Expiration Date: 202011192359
ISSUE DATE / TIME: 202011181456
ISSUE DATE / TIME: 202011181456
ISSUE DATE / TIME: 202011181926
ISSUE DATE / TIME: 202011181926
ISSUE DATE / TIME: 202011182200
Unit Type and Rh: 5100
Unit Type and Rh: 6200
Unit Type and Rh: 5100
Unit Type and Rh: 5100
Unit Type and Rh: 6200

## 2018-12-13 LAB — PREPARE FRESH FROZEN PLASMA
Unit division: 0
Unit division: 0
Unit division: 0

## 2018-12-13 LAB — POCT I-STAT 7, (LYTES, BLD GAS, ICA,H+H)
Acid-base deficit: 3 mmol/L — ABNORMAL HIGH (ref 0.0–2.0)
Acid-base deficit: 3 mmol/L — ABNORMAL HIGH (ref 0.0–2.0)
Bicarbonate: 21.5 mmol/L (ref 20.0–28.0)
Bicarbonate: 22.2 mmol/L (ref 20.0–28.0)
Calcium, Ion: 1.15 mmol/L (ref 1.15–1.40)
Calcium, Ion: 1.15 mmol/L (ref 1.15–1.40)
HCT: 26 % — ABNORMAL LOW (ref 39.0–52.0)
HCT: 25 % — ABNORMAL LOW (ref 39.0–52.0)
Hemoglobin: 8.8 g/dL — ABNORMAL LOW (ref 13.0–17.0)
Hemoglobin: 8.5 g/dL — ABNORMAL LOW (ref 13.0–17.0)
O2 Saturation: 99 %
O2 Saturation: 98 %
Patient temperature: 37.8
Patient temperature: 37.5
Potassium: 4.2 mmol/L (ref 3.5–5.1)
Potassium: 4.1 mmol/L (ref 3.5–5.1)
Sodium: 141 mmol/L (ref 135–145)
Sodium: 142 mmol/L (ref 135–145)
TCO2: 23 mmol/L (ref 22–32)
TCO2: 23 mmol/L (ref 22–32)
pCO2 arterial: 36.6 mmHg (ref 32.0–48.0)
pCO2 arterial: 40.3 mmHg (ref 32.0–48.0)
pH, Arterial: 7.38 (ref 7.350–7.450)
pH, Arterial: 7.351 (ref 7.350–7.450)
pO2, Arterial: 148 mmHg — ABNORMAL HIGH (ref 83.0–108.0)
pO2, Arterial: 121 mmHg — ABNORMAL HIGH (ref 83.0–108.0)

## 2018-12-13 LAB — PREPARE PLATELET PHERESIS
Unit division: 0
Unit division: 0
Unit division: 0
Unit division: 0
Unit division: 0

## 2018-12-13 LAB — BPAM FFP
Blood Product Expiration Date: 202011222359
Blood Product Expiration Date: 202011222359
Blood Product Expiration Date: 202011222359
Blood Product Expiration Date: 202011222359
ISSUE DATE / TIME: 202011181459
ISSUE DATE / TIME: 202011181459
ISSUE DATE / TIME: 202011182341
ISSUE DATE / TIME: 202011182341
Unit Type and Rh: 6200
Unit Type and Rh: 6200
Unit Type and Rh: 6200
Unit Type and Rh: 6200

## 2018-12-13 LAB — PREPARE CRYOPRECIPITATE
Unit division: 0
Unit division: 0

## 2018-12-13 LAB — CBC
HCT: 30.1 % — ABNORMAL LOW (ref 39.0–52.0)
HCT: 27.2 % — ABNORMAL LOW (ref 39.0–52.0)
Hemoglobin: 10.1 g/dL — ABNORMAL LOW (ref 13.0–17.0)
Hemoglobin: 9.1 g/dL — ABNORMAL LOW (ref 13.0–17.0)
MCH: 30.3 pg (ref 26.0–34.0)
MCH: 29.9 pg (ref 26.0–34.0)
MCHC: 33.6 g/dL (ref 30.0–36.0)
MCHC: 33.5 g/dL (ref 30.0–36.0)
MCV: 90.4 fL (ref 80.0–100.0)
MCV: 89.5 fL (ref 80.0–100.0)
Platelets: 135 10*3/uL — ABNORMAL LOW (ref 150–400)
Platelets: 81 10*3/uL — ABNORMAL LOW (ref 150–400)
RBC: 3.33 MIL/uL — ABNORMAL LOW (ref 4.22–5.81)
RBC: 3.04 MIL/uL — ABNORMAL LOW (ref 4.22–5.81)
RDW: 13.2 % (ref 11.5–15.5)
RDW: 13.7 % (ref 11.5–15.5)
WBC: 8.9 10*3/uL (ref 4.0–10.5)
WBC: 7 10*3/uL (ref 4.0–10.5)
nRBC: 0 % (ref 0.0–0.2)
nRBC: 0 % (ref 0.0–0.2)

## 2018-12-13 LAB — BPAM CRYOPRECIPITATE
Blood Product Expiration Date: 202011182210
Blood Product Expiration Date: 202011182210
ISSUE DATE / TIME: 202011181638
ISSUE DATE / TIME: 202011181638
Unit Type and Rh: 6200
Unit Type and Rh: 6200

## 2018-12-13 LAB — GLUCOSE, CAPILLARY
Glucose-Capillary: 166 mg/dL — ABNORMAL HIGH (ref 70–99)
Glucose-Capillary: 206 mg/dL — ABNORMAL HIGH (ref 70–99)
Glucose-Capillary: 174 mg/dL — ABNORMAL HIGH (ref 70–99)
Glucose-Capillary: 181 mg/dL — ABNORMAL HIGH (ref 70–99)
Glucose-Capillary: 187 mg/dL — ABNORMAL HIGH (ref 70–99)
Glucose-Capillary: 161 mg/dL — ABNORMAL HIGH (ref 70–99)
Glucose-Capillary: 169 mg/dL — ABNORMAL HIGH (ref 70–99)
Glucose-Capillary: 138 mg/dL — ABNORMAL HIGH (ref 70–99)
Glucose-Capillary: 163 mg/dL — ABNORMAL HIGH (ref 70–99)
Glucose-Capillary: 172 mg/dL — ABNORMAL HIGH (ref 70–99)
Glucose-Capillary: 163 mg/dL — ABNORMAL HIGH (ref 70–99)
Glucose-Capillary: 135 mg/dL — ABNORMAL HIGH (ref 70–99)
Glucose-Capillary: 113 mg/dL — ABNORMAL HIGH (ref 70–99)
Glucose-Capillary: 101 mg/dL — ABNORMAL HIGH (ref 70–99)
Glucose-Capillary: 101 mg/dL — ABNORMAL HIGH (ref 70–99)
Glucose-Capillary: 105 mg/dL — ABNORMAL HIGH (ref 70–99)
Glucose-Capillary: 129 mg/dL — ABNORMAL HIGH (ref 70–99)
Glucose-Capillary: 99 mg/dL (ref 70–99)
Glucose-Capillary: 106 mg/dL — ABNORMAL HIGH (ref 70–99)
Glucose-Capillary: 104 mg/dL — ABNORMAL HIGH (ref 70–99)

## 2018-12-13 LAB — BASIC METABOLIC PANEL
Anion gap: 11 (ref 5–15)
Anion gap: 7 (ref 5–15)
BUN: 19 mg/dL (ref 8–23)
BUN: 24 mg/dL — ABNORMAL HIGH (ref 8–23)
CO2: 22 mmol/L (ref 22–32)
CO2: 24 mmol/L (ref 22–32)
Calcium: 8 mg/dL — ABNORMAL LOW (ref 8.9–10.3)
Calcium: 7.9 mg/dL — ABNORMAL LOW (ref 8.9–10.3)
Chloride: 107 mmol/L (ref 98–111)
Chloride: 109 mmol/L (ref 98–111)
Creatinine, Ser: 1.35 mg/dL — ABNORMAL HIGH (ref 0.61–1.24)
Creatinine, Ser: 0.98 mg/dL (ref 0.61–1.24)
GFR calc Af Amer: >60 mL/min (ref >60)
GFR calc Af Amer: >60 mL/min (ref >60)
GFR calc non Af Amer: 55 mL/min — ABNORMAL LOW (ref >60)
GFR calc non Af Amer: >60 mL/min (ref >60)
Glucose, Bld: 200 mg/dL — ABNORMAL HIGH (ref 70–99)
Glucose, Bld: 127 mg/dL — ABNORMAL HIGH (ref 70–99)
Potassium: 4.2 mmol/L (ref 3.5–5.1)
Potassium: 4.4 mmol/L (ref 3.5–5.1)
Sodium: 140 mmol/L (ref 135–145)
Sodium: 140 mmol/L (ref 135–145)

## 2018-12-13 LAB — PREPARE RBC (CROSSMATCH)

## 2018-12-13 LAB — COOXEMETRY PANEL
Carboxyhemoglobin: 1.5 % (ref 0.5–1.5)
Methemoglobin: 1.6 % — ABNORMAL HIGH (ref 0.0–1.5)
O2 Saturation: 74.1 %
Total hemoglobin: 10.3 g/dL — ABNORMAL LOW (ref 12.0–16.0)

## 2018-12-13 SURGERY — BIOPSY, PENIS
Anesthesia: Monitor Anesthesia Care

## 2018-12-13 MED ORDER — DOPAMINE-DEXTROSE 3.2-5 MG/ML-% IV SOLN
1.5000 ug/kg/min | INTRAVENOUS | Status: DC
  Administered 2018-12-13 – 2018-12-15 (×2): 2.5 ug/kg/min via INTRAVENOUS
  Administered 2018-12-16: 3 ug/kg/min via INTRAVENOUS
  Filled 2018-12-13 (×3): qty 250

## 2018-12-13 MED ORDER — SODIUM CHLORIDE 0.9% IV SOLUTION
Freq: Once | INTRAVENOUS | Status: AC
  Administered 2018-12-13: 10:00:00 via INTRAVENOUS

## 2018-12-13 MED ORDER — WHITE PETROLATUM EX OINT: TOPICAL_OINTMENT | CUTANEOUS | Status: DC | PRN

## 2018-12-13 MED ORDER — LACTATED RINGERS IV SOLN
INTRAVENOUS | Status: DC
  Administered 2018-12-13 – 2018-12-14 (×3): via INTRAVENOUS

## 2018-12-13 MED ORDER — ORAL CARE MOUTH RINSE
15.0000 mL | Freq: Two times a day (BID) | OROMUCOSAL | Status: DC
  Administered 2018-12-13 – 2018-12-16 (×7): 15 mL via OROMUCOSAL

## 2018-12-13 MED ORDER — PROMETHAZINE HCL 25 MG/ML IJ SOLN
12.5000 mg | Freq: Four times a day (QID) | INTRAMUSCULAR | Status: DC | PRN
  Administered 2018-12-13 – 2018-12-16 (×4): 12.5 mg via INTRAVENOUS
  Filled 2018-12-13 (×3): qty 1

## 2018-12-13 NOTE — Progress Notes (Signed)
EVENING ROUNDS NOTE :     [address]       [place] [address]             [phone]                 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 (N/A)   Total Length of Stay:  LOS: 1 day  Events:  No events Resting in chair    BP 107/64   Pulse 89   Temp 98.1 F (36.7 C)   Resp 15   Ht 6\' 3"  (1.905 m)   Wt 114.1 kg   SpO2 92%   BMI 31.44 kg/m   PAP: (22-33)/(8-19) 25/13 CO:  [5.1 L/min-7.3 L/min] 5.1 L/min CI:  [2.2 L/min/m2-3.1 L/min/m2] 2.2 L/min/m2  Vent Mode: CPAP;PSV FiO2 (%):  [40 %-50 %] 40 % Set Rate:  [4 bmp-16 bmp] 4 bmp Vt Set:  [id number] mL] 670 mL PEEP:  [5 cmH20] 5 cmH20 Pressure Support:  [10 cmH20] 10 cmH20  . sodium chloride    . sodium chloride    . sodium chloride 20 mL/hr at 12/12/18 1450  . amiodarone Stopped (12/13/18 1323)  . cefUROXime (ZINACEF)  IV Stopped (12/13/18 0948)  . DOPamine 2.5 mcg/kg/min (12/13/18 1600)  . EPINEPHrine 4 mg in dextrose 5% 250 mL infusion (16 mcg/mL) Stopped (12/13/18 1207)  . insulin 1.2 mL/hr at 12/13/18 1600  . lactated ringers    . lactated ringers    . lactated ringers 75 mL/hr at 12/13/18 0750  . lactated ringers 75 mL/hr at 12/13/18 1600  . phenylephrine (NEO-SYNEPHRINE) Adult infusion 35 mcg/min (12/13/18 1600)  . vasopressin (PITRESSIN) infusion - *FOR SHOCK* 0.04 Units/min (12/13/18 1600)    I/O last 3 completed shifts: In: 11855.1 [I.V.:6497.1; Blood:4180; IV [id number] Out: [id number] [Urine:3110; Blood:1350; Chest Tube:3130]   CBC Latest Ref Rng & Units 12/13/2018 12/13/2018 12/13/2018  WBC 4.0 - 10.5 K/uL - - 8.9  Hemoglobin 13.0 - 17.0 g/dL 8.5(L) 8.8(L) 10.1(L)  Hematocrit 39.0 - 52.0 % 25.0(L) 26.0(L) 30.1(L)  Platelets 150 - 400 K/uL - - 135(L)    BMP Latest Ref Rng & Units 12/13/2018 12/13/2018 12/13/2018  Glucose 70 - 99 mg/dL - - 200(H)  BUN 8 - 23 mg/dL - - 19  Creatinine 0.61 - 1.24 mg/dL - - 1.35(H)  BUN/Creat Ratio 10 -  24 - - -  Sodium 135 - 145 mmol/L 142 141 140  Potassium 3.5 - 5.1 mmol/L 4.1 4.2 4.2  Chloride 98 - 111 mmol/L - - 107  CO2 22 - 32 mmol/L - - 22  Calcium 8.9 - 10.3 mg/dL - - 8.0(L)    ABG    Component Value Date/Time   PHART 7.351 12/13/2018 0659   PCO2ART 40.3 12/13/2018 0659   PO2ART 121.0 (H) 12/13/2018 0659   HCO3 22.2 12/13/2018 0659   TCO2 23 12/13/2018 0659   ACIDBASEDEF 3.0 (H) 12/13/2018 0659   O2SAT 98.0 12/13/2018 [id number]       Melodie Bouillon, MD 12/13/2018 5:45 PM

## 2018-12-13 NOTE — Discharge Instructions (Signed)

## 2018-12-13 NOTE — Anesthesia Postprocedure Evaluation (Signed)
Anesthesia Post Note  Patient: Frank Love  Procedure(s) Performed: CORONARY ARTERY BYPASS GRAFTING (CABG) x four, using bilateral internal mammary arteries and right leg greater saphenous vein harvested endoscopically (N/A Chest) INDOCYANINE GREEN FLUORESCENCE IMAGING (ICG) (N/A ) TRANSESOPHAGEAL ECHOCARDIOGRAM (TEE) (N/A )     Patient location during evaluation: SICU Anesthesia Type: General Level of consciousness: sedated Pain management: pain level controlled Vital Signs Assessment: post-procedure vital signs reviewed and stable Respiratory status: patient remains intubated per anesthesia plan Cardiovascular status: stable Postop Assessment: no apparent nausea or vomiting Anesthetic complications: no    Last Vitals:  Vitals:   12/13/18 2200 12/13/18 2215  BP: 103/60   Pulse: 89 89  Resp: 19 19  Temp:    SpO2: 93% 95%    Last Pain:  Vitals:   12/13/18 2125  TempSrc:   PainSc: 9    Pain Goal: Patients Stated Pain Goal: 4 (12/13/18 2125)                 [name]

## 2018-12-13 NOTE — Progress Notes (Signed)
PT Cancellation Note  Patient Details Name: DANIIL BOERMAN MRN: [id number] DOB: November 25, 1955   Cancelled Treatment:    Reason Eval/Treat Not Completed: Patient not medically ready; checked on pt this am.  Not quite ready yet for PT.  Will attempt again later today.  Thanks   Reginia Naas 12/13/2018, 9:22 AM  Magda Kiel, [place] [phone] 12/13/2018

## 2018-12-13 NOTE — Progress Notes (Signed)
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 (N/A) Subjective: Awake, no complaints  Objective: Vital signs in last 24 hours: Temp:  [95.9 F (35.5 C)-100.2 F (37.9 C)] 99.5 F (37.5 C) (11/19 0700) Pulse Rate:  [71-86] 80 (11/19 0700) Cardiac Rhythm: Atrial paced (11/19 0400) Resp:  [8-28] 23 (11/19 0700) BP: (83-120)/(54-75) 114/54 (11/19 0700) SpO2:  [92 %-100 %] 100 % (11/19 0700) Arterial Line BP: (84-149)/(28-60) 135/55 (11/19 0700) FiO2 (%):  [40 %-50 %] 40 % (11/19 0516) Weight:  [114.1 kg] 114.1 kg (11/19 0300)  Hemodynamic parameters for last 24 hours: PAP: (23-33)/(3-19) 25/12 CVP:  [1 mmHg-25 mmHg] 6 mmHg CO:  [3.8 L/min-7.3 L/min] 5.1 L/min CI:  [1.6 L/min/m2-3.1 L/min/m2] 2.2 L/min/m2  Intake/Output from previous day: 11/18 0701 - 11/19 0700 In: 11855.1 [I.V.:6497.1; Blood:4180; IV [id number] Out: [id number] [Urine:3110; Blood:1350; Chest Tube:3130] Intake/Output this shift: No intake/output data recorded.  General appearance: alert, cooperative and no distress Neurologic: intact Heart: regular rate and rhythm, S1, S2 normal, no murmur, click, rub or gallop Lungs: diminished breath sounds bibasilar Abdomen: soft, non-tender; bowel sounds normal; no masses,  no organomegaly Extremities: extremities normal, atraumatic, no cyanosis or edema Wound: dressed, dry  Lab Results: Recent Labs    12/12/18 2206 12/13/18 0411 12/13/18 0537 12/13/18 0659  WBC 11.0* 8.9  --   --   HGB 10.6* 10.1* 8.8* 8.5*  HCT 31.8* 30.1* 26.0* 25.0*  PLT 120* 135*  --   --    BMET:  Recent Labs    12/11/18 0800  12/12/18 2100  12/13/18 0411 12/13/18 0537 12/13/18 0659  NA 138   < > 141   < > 140 141 142  K 3.9   < > 4.0   < > 4.2 4.2 4.1  CL 108   < > 104  --  107  --   --   CO2 21*  --   --   --  22  --   --   GLUCOSE 114*   < > 182*  --  200*  --   --   BUN 22   < > 18  --  19  --   --   CREATININE 0.93   < > 0.80  --  1.35*  --    --   CALCIUM 8.8*  --   --   --  8.0*  --   --    < > = values in this interval not displayed.    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

## 2018-12-13 NOTE — Anesthesia Postprocedure Evaluation (Signed)
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 (N/A Chest)     Patient location during evaluation: SICU Anesthesia Type: General Level of consciousness: awake and alert and patient cooperative Pain management: pain level controlled Vital Signs Assessment: post-procedure vital signs reviewed and stable Respiratory status: spontaneous breathing, nonlabored ventilation, respiratory function stable and patient connected to nasal cannula oxygen Cardiovascular status: blood pressure returned to baseline Anesthetic complications: no    Last Vitals:  Vitals:   12/13/18 1900 12/13/18 2000  BP: 104/81 (!) 101/59  Pulse: 89 88  Resp: 19 (!) 27  Temp:    SpO2: 95% 91%    Last Pain:  Vitals:   12/13/18 1100  TempSrc:   PainSc: 6                  Seung Nidiffer COKER

## 2018-12-13 NOTE — Discharge Summary (Signed)
Physician Discharge Summary       [address]       [place] [address]             [phone]    Patient ID: Frank Love MRN: [id number] DOB/AGE: [birth year] 62 y.o.  Admit date: 12/12/2018 Discharge date: 12/19/2018  Admission Diagnoses: Coronary artery disease  Discharge Diagnoses:  1. S/P CABG x 4 2. Expected ABL anemia 3. History of hypertension 4. History of chronic pain in the right knee 5. History of phimosis  Consults: Heart failure  Procedure (s):   Coronary Artery Bypass Grafting x 4              Left Internal Mammary Artery to Distal Left Anterior Descending Coronary Artery; pedicled RIMA Graft to  Posterior Descending Coronary Artery; Saphenous Vein Graft to 1st Obtuse Marginal Branch of Left Circumflex Coronary Artery; Sapheonous Vein Graft to acute margional Branch Right Coronary Artery; Endoscopic Vein Harvest from right Thigh;  Bilateral IMA harvesting Completion graft surveillance with indocyanine green fluorescence imaging (SPY) Rigid sternal reconstruction with linear plating system (KLS) by Dr. Orvan Seen on 12/12/2018.  Mediastinal Re-Exploration by Dr. Orvan Seen late evening 11/18  History of Presenting Illness: This is a 63 yo man was in his USOH until approximately 2 mos ago when he was noted to have increased, unexplained LE edema. Labs showed elevated BNP suggesting CHF. He has been medically managed with relief of sx. In the meantime, work-up of CHF by coronary calcium CT scan, echo, and left heart catheterization shows severe multivessel CAD and depressed LV function. He was otherwise scheduled to undergo hip replacement but this is now deferred. Denies angina or angina equivalent; denies orthopnea or cough.  Dr. Orvan Seen discussed the need for coronary artery bypass grafting surgery. Potential risks, benefits, and complications of the surgery were discussed with the patient and he agreed to proceed with surgery.  Pre operative  carotid duplex US showed no significant left internal carotid artery stenosis and 40-59% right internal carotid artery stenosis. Patient underwent a CABG x 4 on 12/12/2018.  Brief Hospital Course:  Patient was taken back to the OR the evening of 11/18 for mediastinal re exploration related to bleeding post op.  The patient was then extubated on 11/19 without difficulty. He remained afebrile and hemodynamically stable. He was weaned off of Dopamine and Vasopressin drips. Gordy Councilman, a line, chest tubes, and foley were removed early in the post operative course. He was started on Coreg. He was volume over loaded and diuresed. He had ABL anemia. He did not require a post op transfusion. Last H and H was 7.9 and 24.5. He also had thrombocytopenia. His platelet count went as low as 60,000. This did resolve as last platelet count was up to 122,000 on 11/23.Marland Kitchen He was started on Midodrine on 11/24 for hypotension. He was weaned off the insulin drip.  The patient's HGA1C pre op was 5. The patient was felt surgically stable for transfer from the ICU to PCTU for further convalescence on 11/24. He continues to progress with cardiac rehab. He was ambulating on room air. He has been tolerating a diet and has had a bowel movement. Epicardial pacing wires were removed on 11/25.  Chest tube sutures will be removed on 11/30 . PT and OT were consulted. CIR was recommended. Patient will be started on a statin. Heart failure has been consulted to assist with medications recommendations. Patient initially seen by medical resident, awaiting heart failure attending's note.  As discussed with Dr. Orvan Seen, the patient is felt surgically stable for discharge to CIR today. Heart failure may make their changes once upstairs.   Latest Vital Signs: Blood pressure 114/69, pulse 75, temperature 98.9 F (37.2 C), temperature source Oral, resp. rate 19, height 6\' 3"  (1.905 m), weight 107 kg, SpO2 99 %.  Physical Exam: Cardiovascular:  RRR Pulmonary: Slightly diminished breath sounds on left Abdomen: Soft, non tender, bowel sounds present. Extremities: Mild bilateral lower extremity edema. Wounds: Clean and dry.  No erythema or signs of infection.  Discharge Condition: Stable and discharge to CIR.  Recent laboratory studies:  Lab Results  Component Value Date   WBC 5.9 12/17/2018   HGB 7.9 (L) 12/17/2018   HCT 24.5 (L) 12/17/2018   MCV 93.5 12/17/2018   PLT 122 (L) 12/17/2018   Lab Results  Component Value Date   NA 142 12/19/2018   K 3.6 12/19/2018   CL 105 12/19/2018   CO2 30 12/19/2018   CREATININE 1.15 12/19/2018   GLUCOSE 100 (H) 12/19/2018      Diagnostic Studies: Dg Chest 1 View  Result Date: 12/14/2018 CLINICAL DATA:  Chest 2 present status post open heart surgery. Chest soreness. EXAM: CHEST  1 VIEW COMPARISON:  Chest radiograph 12/13/2018 FINDINGS: Redemonstrated right IJ approach introducer sheath. The Swan-Ganz catheter has been removed in the interim. Sequela of prior CABG. Bilateral chest tubes are unchanged in position. Stable cardiomegaly. Aortic atherosclerosis. Shallow inspiration radiograph. Unchanged left basilar opacity consistent with atelectasis and probable pleural effusion. Persistent mild right basilar incomplete atelectasis. There is a small right apical pneumothorax, new from prior. Impression #2 will be called to the ordering clinician or representative by the Radiologist Assistant, and communication documented in the PACS or zVision Dashboard. IMPRESSION: Lines and tubes as detailed with unchanged position of bilateral chest tubes. New small right apical pneumothorax. Persistent left basilar opacity consistent with atelectasis and probable pleural effusion. Pneumonia not excluded. Persistent mild right basilar atelectasis. Electronically Signed   By: Kellie Simmering DO   On: 12/14/2018 08:42   Dg Chest 2 View  Result Date: 12/18/2018 CLINICAL DATA:  63 year old male status post CABG.   Mid chest pain. EXAM: CHEST - 2 VIEW COMPARISON:  Chest x-ray 12/17/2018. FINDINGS: Right IJ Cordis with tip terminating in the proximal superior vena cava. Lung volumes are low. Opacity in the left lower lobe compatible with residual postoperative subsegmental atelectasis. Small left pleural effusion. Right lung is clear. No right pleural effusion. Trace residual left apical pneumothorax, decreased compared to the prior. No right pneumothorax. No evidence of pulmonary edema. Heart size is borderline enlarged. The patient is rotated to the left on today's exam, resulting in distortion of the mediastinal contours and reduced diagnostic sensitivity and specificity for mediastinal pathology. Aortic atherosclerosis. Status post median sternotomy for CABG. IMPRESSION: 1. Previously noted right-sided pneumothorax is no longer identified. Trace residual left apical pneumothorax (less than 5% of the volume of the left hemithorax) has decreased slightly. Persistent left lower lobe postoperative subsegmental atelectasis appears slightly improved. 2. Aortic atherosclerosis. Electronically Signed   By: Vinnie Langton M.D.   On: 12/18/2018 09:29   Dg Chest 2 View  Result Date: 12/11/2018 CLINICAL DATA:  Preop for coronary artery bypass graft. Coronary artery disease. EXAM: CHEST - 2 VIEW COMPARISON:  July 09, 2018. FINDINGS: The heart size and mediastinal contours are within normal limits. Both lungs are clear. The visualized skeletal structures are unremarkable. IMPRESSION: No active cardiopulmonary disease. Electronically Signed  By: Marijo Conception M.D.   On: 12/11/2018 10:46   Ct Coronary Morph W/cta Cor W/score W/ca W/cm &/or Wo/cm  Addendum Date: 11/20/2018   ADDENDUM REPORT: 11/20/2018 16:29 CLINICAL DATA:  63 yo male with cardiomyopathy; CTA to excluded CAD. EXAM: Cardiac/Coronary  CT TECHNIQUE: The patient was scanned on a [employer]. FINDINGS: A 120 kV prospective scan was triggered in the  descending thoracic aorta at 111 HU's. Axial non-contrast 3 mm slices were carried out through the heart. The data set was analyzed on a dedicated work station and scored using the [place]. Gantry rotation speed was 250 msecs and collimation was .6 mm. No beta blockade and 0.8 mg of sl NTG was given. The 3D data set was reconstructed in 5% intervals of the 67-82 % of the R-R cycle. Diastolic phases were analyzed on a dedicated work station using MPR, MIP and VRT modes. The patient received 80 cc of contrast. Aorta: Normal size. Aortic calcification noted in ascending and descending thoracic aorta. No dissection. Aortic Valve:  Trileaflet.  No calcifications. Coronary Arteries:  Normal coronary origin.  Right dominance. RCA is a large dominant artery that gives rise to PDA. Mild 1-24% calcified plaque followed by severe 70-99% mixed plaque stenosis proximal RCA; 50-69% stenosis in mid RCA; 70-99 mixed plaque stenosis in distal RCA; moderate 50-69% stenosis proximal PDA. Left main is a large artery that gives rise to LAD and LCX arteries. Mild 1-24% calcified plaque. LAD is a large vessel that gives rise to 4 small diagonals. 25-49% followed by 50-69% proximal LAD; Vessel appears to be occluded in the mid vessel. LCX is a non-dominant artery that gives rise to one OM1 branch and terminates in large OM 2. 25-49% calcified plaque; 50-69% ostial OM1; mild plaque 1-24% OM2. Other findings: Normal pulmonary vein drainage into the left atrium. Normal let atrial appendage without a thrombus. Mildly dilated pulmonary artery. IMPRESSION: 1. Coronary calcium score of 3318. This was 41 percentile for age and sex matched control. 2. Normal coronary origin with right dominance. 3. Severe 3 vessel CAD with occluded LAD, severe RCA stenosis and moderate stenosis OM1; CADRADS-4b. 4. Recommend cardiac catheterization. Kirk Ruths Electronically Signed   By: Kirk Ruths M.D.   On: 11/20/2018 16:29   Result Date:  11/20/2018 EXAM: OVER-READ INTERPRETATION  CT CHEST The following report is an over-read performed by radiologist Dr. Vinnie Langton of [facility], [place] on 11/20/2018. This over-read does not include interpretation of cardiac or coronary anatomy or pathology. The coronary calcium score/coronary CTA interpretation by the cardiologist is attached. COMPARISON:  None. FINDINGS: Aortic atherosclerosis. Within the visualized portions of the thorax there are no suspicious appearing pulmonary nodules or masses, there is no acute consolidative airspace disease, no pleural effusions, no pneumothorax and no lymphadenopathy. Visualized portions of the upper abdomen are unremarkable. There are no aggressive appearing lytic or blastic lesions noted in the visualized portions of the skeleton. IMPRESSION: 1.  Aortic Atherosclerosis (ICD10-I70.0). Electronically Signed: By: Vinnie Langton M.D. On: 11/20/2018 09:03   Dg Chest Port 1 View In Am  Result Date: 12/17/2018 CLINICAL DATA:  Chest soreness, open-heart surgery. EXAM: PORTABLE CHEST 1 VIEW COMPARISON:  12/16/2018 and CT chest 11/20/2018. FINDINGS: Right IJ catheter sheath terminates at the junction with the right subclavian vein. Trachea is midline. Heart is enlarged, stable. Thoracic aorta is calcified. Chest tubes and mediastinal drain have been removed. Left lower lobe collapse/consolidation, increased, with a small left pleural effusion. Tiny biapical pneumothoraces.  Minimal volume loss at the right lung base. IMPRESSION: 1. Interval removal of bilateral chest tubes with tiny biapical pneumothoraces. 2. Increasing left lower lobe collapse/consolidation, likely due to atelectasis, with a small left pleural effusion. 3. Mild right basilar atelectasis. Electronically Signed   By: Lorin Picket M.D.   On: 12/17/2018 07:49   Dg Chest Port 1 View In Am  Result Date: 12/16/2018 CLINICAL DATA:  Status post CABG. EXAM: PORTABLE CHEST 1 VIEW COMPARISON:   12/15/2018 FINDINGS: 0459 hours. The cardio pericardial silhouette is enlarged. Persistent retrocardiac collapse/consolidation. Bilateral chest tubes remain in place with tiny right apical pneumothorax, similar to prior. Telemetry leads overlie the chest. IMPRESSION: 1. No substantial interval change in exam. Tiny right apical pneumothorax with bilateral chest tubes in place. 2. Persistent retrocardiac collapse/consolidation. Electronically Signed   By: Misty Stanley M.D.   On: 12/16/2018 09:49   Dg Chest Port 1 View  Result Date: 12/15/2018 CLINICAL DATA:  Open heart surgery. EXAM: PORTABLE CHEST 1 VIEW COMPARISON:  12/14/2018 FINDINGS: 0623 hours. Low volumes with bilateral chest tubes in situ. Tiny right apical pneumothorax is stable. Right IJ sheath remains in place. Retrocardiac left base atelectasis/infiltrate is unchanged. The visualized bony structures of the thorax are intact. Insert wire IMPRESSION: 1. No substantial change exam. Tiny right apical pneumothorax with bilateral chest tubes in situ. 2. Stable retrocardiac left base atelectasis/infiltrate. Electronically Signed   By: Misty Stanley M.D.   On: 12/15/2018 10:06   Dg Chest Port 1 View  Result Date: 12/13/2018 CLINICAL DATA:  Status post coronary bypass graft. EXAM: PORTABLE CHEST 1 VIEW COMPARISON:  December 12, 2018. FINDINGS: Stable cardiomegaly. Endotracheal and nasogastric tubes have been removed. Right internal jugular Swan-Ganz catheter is noted directed toward right pulmonary artery. No pneumothorax is noted. Stable left basilar atelectasis or infiltrate is noted with probable effusion. Mild right basilar subsegmental atelectasis is noted. Bony thorax is unremarkable. IMPRESSION: Stable left basilar opacity as described above. Mild right basilar subsegmental atelectasis. Endotracheal and nasogastric tubes have been removed. Electronically Signed   By: Marijo Conception M.D.   On: 12/13/2018 07:43   Dg Chest Port 1 View  Result  Date: 12/12/2018 CLINICAL DATA:  64 year old male status post CABG. EXAM: PORTABLE CHEST 1 VIEW COMPARISON:  Chest radiograph dated 12/11/2018. FINDINGS: Endotracheal tube approximately 4 cm above the carina. Enteric tube extends below the diaphragm with tip beyond the inferior margin of the image. A Swan-Ganz catheter with tip likely in the main pulmonary trunk and an inferiorly approached tube with tip over the right mid lung field noted. Left lung base densities may represent atelectasis. A small left pleural effusion may be present. No pneumothorax. There is enlargement of the cardiopericardial silhouette. Median sternotomy wires and postsurgical changes of CABG. Small amount of pneumomediastinum, postsurgical. IMPRESSION: 1. Postsurgical changes of CABG with support lines and tubes as described. No pneumothorax. 2. Left lung base densities may represent atelectasis. Small left pleural effusion may be present. Electronically Signed   By: Anner Crete M.D.   On: 12/12/2018 16:12   Doppler Pre Cabg  Result Date: 12/11/2018 PREOPERATIVE VASCULAR EVALUATION  Risk Factors:     Hypertension, coronary artery disease. Comparison Study: No priors Performing Technologist: Oda Cogan Chief Tech  Examination Guidelines: A complete evaluation includes B-mode imaging, spectral Doppler, color Doppler, and power Doppler as needed of all accessible portions of each vessel. Bilateral testing is considered an integral part of a complete examination. Limited examinations for reoccurring indications may be  performed as noted.  Right Carotid Findings: +----------+--------+--------+--------+------------+--------+             PSV cm/s EDV cm/s Stenosis Describe     Comments  +----------+--------+--------+--------+------------+--------+  CCA Prox   67       13                                       +----------+--------+--------+--------+------------+--------+  CCA Distal 58       14                                        +----------+--------+--------+--------+------------+--------+  ICA Prox   132      40       40-59%   heterogenous           +----------+--------+--------+--------+------------+--------+  ICA Mid    92       24                                       +----------+--------+--------+--------+------------+--------+  ICA Distal 65       17                                       +----------+--------+--------+--------+------------+--------+  ECA        155      19                                       +----------+--------+--------+--------+------------+--------+ Portions of this table do not appear on this page. +----------+--------+-------+----------------+------------+             PSV cm/s EDV cms Describe         Arm Pressure  +----------+--------+-------+----------------+------------+  Subclavian 94               Multiphasic, WNL 148           +----------+--------+-------+----------------+------------+ +---------+--------+--+--------+--+---------+  Vertebral PSV cm/s 40 EDV cm/s 12 Antegrade  +---------+--------+--+--------+--+---------+ Left Carotid Findings: +----------+--------+--------+--------+--------+--------+             PSV cm/s EDV cm/s Stenosis Describe Comments  +----------+--------+--------+--------+--------+--------+  CCA Prox   82       14                                   +----------+--------+--------+--------+--------+--------+  CCA Distal 58       13                                   +----------+--------+--------+--------+--------+--------+  ICA Prox   90       23       1-39%                       +----------+--------+--------+--------+--------+--------+  ICA Distal 67       19  tortuous  +----------+--------+--------+--------+--------+--------+  ECA        144      15                                   +----------+--------+--------+--------+--------+--------+ +----------+--------+--------+----------------+------------+  Subclavian PSV cm/s EDV cm/s Describe         Arm Pressure   +----------+--------+--------+----------------+------------+             187               Multiphasic, WNL 144           +----------+--------+--------+----------------+------------+ +---------+--------+--+--------+--+---------+  Vertebral PSV cm/s 35 EDV cm/s 14 Antegrade  +---------+--------+--+--------+--+---------+  ABI Findings: +---------+------------------+-----+---------+----------------+  Right     Rt Pressure (mmHg) Index Waveform  Comment           +---------+------------------+-----+---------+----------------+  Brachial  148                      triphasic                   +---------+------------------+-----+---------+----------------+  ATA       140                0.95  triphasic                   +---------+------------------+-----+---------+----------------+  PTA                                triphasic non compressible  +---------+------------------+-----+---------+----------------+  Great Toe 116                0.78                              +---------+------------------+-----+---------+----------------+ +---------+------------------+-----+---------+----------------+  Left      Lt Pressure (mmHg) Index Waveform  Comment           +---------+------------------+-----+---------+----------------+  Brachial  144                      triphasic                   +---------+------------------+-----+---------+----------------+  ATA                                triphasic non compressible  +---------+------------------+-----+---------+----------------+  PTA                                triphasic Non compressible  +---------+------------------+-----+---------+----------------+  Great Toe 124                0.84                              +---------+------------------+-----+---------+----------------+ +-------+------------------------+----------------+  ABI/TBI Today's ABI/TBI          Previous ABI/TBI  +-------+------------------------+----------------+  Right   0.95                                        +-------+------------------------+----------------+  Left  Non compressible vessels                   +-------+------------------------+----------------+  Right Doppler Findings: +-----------+--------+-----+---------+--------+  Site        Pressure Index Doppler   Comments  +-----------+--------+-----+---------+--------+  Brachial    148            triphasic           +-----------+--------+-----+---------+--------+  Radial                     triphasic           +-----------+--------+-----+---------+--------+  Ulnar                      triphasic           +-----------+--------+-----+---------+--------+  Palmar Arch                WNL                 +-----------+--------+-----+---------+--------+  Left Doppler Findings: +-----------+--------+-----+---------+--------+  Site        Pressure Index Doppler   Comments  +-----------+--------+-----+---------+--------+  Brachial    144            triphasic           +-----------+--------+-----+---------+--------+  Radial                     triphasic           +-----------+--------+-----+---------+--------+  Ulnar                      triphasic           +-----------+--------+-----+---------+--------+  Palmar Arch                WNL                 +-----------+--------+-----+---------+--------+  Summary: Right Carotid: Velocities in the right ICA are consistent with a 40-59%                stenosis. Left Carotid: Velocities in the left ICA are consistent with a 1-39% stenosis. Vertebrals: Bilateral vertebral arteries demonstrate antegrade flow. Right ABI: Resting right ankle-brachial index is within normal range. No evidence of significant right lower extremity arterial disease. The right toe-brachial index is normal. Left ABI: Resting left ankle-brachial index indicates noncompressible left lower extremity arteries. The left toe-brachial index is normal. Bilateral Extremity: Doppler waveforms remain within normal limits with compression bilaterally for the radial  arteries. Doppler waveforms remain within normal limits with compression bilaterally for the ulnar arteries.  Electronically signed by Deitra Mayo MD on 12/11/2018 at 1:09:57 PM.    Final    Discharge Medications: Allergies as of 12/19/2018   No Known Allergies     Medication List    STOP taking these medications   Entresto 24-26 MG Generic drug: sacubitril-valsartan   metoprolol tartrate 25 MG tablet Commonly known as: LOPRESSOR   nitroGLYCERIN 0.4 MG SL tablet Commonly known as: NITROSTAT     TAKE these medications   aspirin EC 81 MG tablet Take 81 mg by mouth at bedtime.   carvedilol 3.125 MG tablet Commonly known as: COREG Take 1 tablet (3.125 mg total) by mouth 2 (two) times daily with a meal.   clopidogrel 75 MG tablet Commonly known as: PLAVIX Take 1 tablet (75 mg total) by mouth daily. Start taking on: December 20, 2018  colchicine 0.6 MG tablet Take 1 tablet (0.6 mg total) by mouth daily. For one month then stop. Start taking on: December 20, 2018   furosemide 40 MG tablet Commonly known as: LASIX Take 1 tablet (40 mg total) by mouth daily. For 5 days then stop. Start taking on: December 20, 2018   midodrine 5 MG tablet Commonly known as: PROAMATINE Take 1 tablet (5 mg total) by mouth 3 (three) times daily with meals.   oxyCODONE 5 MG immediate release tablet Commonly known as: Oxy IR/ROXICODONE Take 1 tablet (5 mg total) by mouth every 4 (four) hours as needed for severe pain.   potassium chloride SA 20 MEQ tablet Commonly known as: KLOR-CON Take 1 tablet (20 mEq total) by mouth daily. For 5 days then stop. Start taking on: December 20, 2018      The patient has been discharged on:   1.Beta Blocker:  Yes [ x  ]                              No   [   ]                              If No, reason:  2.Ace Inhibitor/ARB: Yes [   ]                                     No  [    ]                                     If No, reason:Labile BP    3.Statin:   Yes [ x  ]                  No  [   ]                  If No, reason:  4.Ecasa:  Yes  [ x  ]                  No   [   ]                  If No, reason:  Follow Up Appointments: Follow-up Information    [name], MD. Go on 01/03/2019.   Specialty: Cardiology Why: Appointment time is at 1:40 pm Contact information: [address] [place] [address] [phone]        [name], MD. Go on 01/07/2019.   Specialty: Cardiothoracic Surgery Why: Appointment time is at 10:30 am Contact information: [address] [place] [address] [phone]           Signed: Sharalyn Ink [facility] 12/19/2018, 2:03 PM

## 2018-12-13 NOTE — Evaluation (Signed)
Physical Therapy Evaluation Patient Details Name: Frank Love MRN: [id number] DOB: 01-20-1956 Today's Date: 12/13/2018   History of Present Illness  Pt adm for CABG x 4 on 11/18. Underwent urgent mediastinal exploration same day and found small bleed from one graft which was repaired with 1 suture. 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 Expect pt's progress may be slow and will ask CIR to consider. If progress is faster and pt can go home then will update as appropriate.    Follow Up Recommendations CIR;Supervision/Assistance - 24 hour    Equipment Recommendations  Other (comment)(To be determined)    Recommendations for Other Services       Precautions / Restrictions Precautions Precautions: Fall;Sternal      Mobility  Bed Mobility Overal bed mobility: Needs Assistance Bed Mobility: Supine to Sit     Supine to sit: +2 for physical assistance;Max assist     General bed mobility comments: Assist to bring legs off of bed, elevate trunk into sitting, and bring hips to EOB.   Transfers Overall transfer level: Needs assistance Equipment used: 2 person hand held assist Transfers: Sit to/from [facility] Sit to Stand: +2 physical assistance;Max assist Stand pivot transfers: +2 physical assistance;Mod assist       General transfer comment: Heavy assist to bring hips and trunk up and for balance. Very slow rise and flexed posture. Stand pirvot toward lt with small shuflling steps. 3rd person present to manage multiple lines/tubes.  Ambulation/Gait                Stairs            Wheelchair Mobility    Modified Rankin (Stroke Patients Only)       Balance Overall balance assessment: Needs assistance Sitting-balance support: Bilateral upper extremity supported;Feet supported Sitting  balance-Leahy Scale: Poor Sitting balance - Comments: UE support and min guard for static sitting EOB.   Standing balance support: Bilateral upper extremity supported Standing balance-Leahy Scale: Poor Standing balance comment: +2 mod assist for static standing                             Pertinent Vitals/Pain Pain Assessment: 0-10 Pain Score: 9  Pain Location: chest, sternal incision Pain Descriptors / Indicators: Aching Pain Intervention(s): Limited activity within patient's tolerance;Monitored during session;Repositioned    Home Living Family/patient expects to be discharged to:: Private residence Living Arrangements: Spouse/significant other Available Help at Discharge: Family;Available 24 hours/day Type of Home: Apartment Home Access: Stairs to enter Entrance Stairs-Rails: Right Entrance Stairs-Number of Steps: 3+5 Home Layout: One level Home Equipment: Walker - 2 wheels;Cane - single point Additional Comments: Pt sleeps in armchair with feet on ottoman    Prior Function Level of Independence: Independent with assistive device(s)         Comments: Used cane due to bad rt hip. Wife reports several falls at home     Hand Dominance   Dominant Hand: Right    Extremity/Trunk Assessment   Upper Extremity Assessment Upper Extremity Assessment: Defer to OT evaluation    Lower Extremity Assessment Lower Extremity Assessment: Generalized weakness;RLE deficits/detail RLE Deficits / Details: Limited strength and ROM due to rt hip pain       Communication   Communication: No difficulties  Cognition  Arousal/Alertness: Awake/alert Behavior During Therapy: WFL for tasks assessed/performed Overall Cognitive Status: Within Functional Limits for tasks assessed                                        General Comments General comments (skin integrity, edema, etc.): VSS throughout    Exercises     Assessment/Plan    PT Assessment Patient  needs continued PT services  PT Problem List Decreased strength;Decreased range of motion;Decreased activity tolerance;Decreased balance;Decreased mobility;Decreased knowledge of use of DME;Decreased knowledge of precautions;Pain       PT Treatment Interventions DME instruction;Gait training;Functional mobility training;Therapeutic activities;Therapeutic exercise;Balance training;Patient/family education;Stair training    PT Goals (Current goals can be found in the Care Plan section)  Acute Rehab PT Goals Patient Stated Goal: get his hip done PT Goal Formulation: With patient/family Time For Goal Achievement: 12/27/18 Potential to Achieve Goals: Good    Frequency Min 3X/week   Barriers to discharge Inaccessible home environment multiple steps to enter    Co-evaluation               AM-PAC PT "6 Clicks" Mobility  Outcome Measure Help needed turning from your back to your side while in a flat bed without using bedrails?: Total Help needed moving from lying on your back to sitting on the side of a flat bed without using bedrails?: Total Help needed moving to and from a bed to a chair (including a wheelchair)?: Total Help needed standing up from a chair using your arms (e.g., wheelchair or bedside chair)?: Total Help needed to walk in hospital room?: Total Help needed climbing 3-5 steps with a railing? : Total 6 Click Score: 6    End of Session   Activity Tolerance: Patient tolerated treatment well Patient left: in chair;with call bell/phone within reach;with family/visitor present Nurse Communication: Mobility status PT Visit Diagnosis: Other abnormalities of gait and mobility (R26.89);Muscle weakness (generalized) (M62.81);Difficulty in walking, not elsewhere classified (R26.2)    Time: 1445-1520 PT Time Calculation (min) (ACUTE ONLY): 35 min   Charges:   PT Evaluation $PT Eval Moderate Complexity: 1 Mod PT Treatments $Therapeutic Activity: 8-22 mins         [place] Pager [phone] Office [place] 12/13/2018, 4:05 PM

## 2018-12-13 NOTE — Procedures (Signed)
Extubation Procedure Note  Patient Details:   Name: Frank Love DOB: 05-10-1955 MRN: [id number]   Airway Documentation:    Vent end date: 12/13/18 Vent end time: 0538   Evaluation  O2 sats: stable throughout Complications: No apparent complications Patient did tolerate procedure well. Bilateral Breath Sounds: Clear   Yes   Pt extubated per SICU wean protocol. ABG within normal limits. Pt placed on 4l [place] with sats of 100%.  NIF -32, VC 2.2L with good pt effort.  RT will continue to monitor. RN at bedside.   Pierre Bali 12/13/2018, 5:43 AM

## 2018-12-14 ENCOUNTER — Inpatient Hospital Stay (HOSPITAL_COMMUNITY): Payer: No Typology Code available for payment source

## 2018-12-14 LAB — CBC
HCT: 26.1 % — ABNORMAL LOW (ref 39.0–52.0)
Hemoglobin: 8.8 g/dL — ABNORMAL LOW (ref 13.0–17.0)
MCH: 30.3 pg (ref 26.0–34.0)
MCHC: 33.7 g/dL (ref 30.0–36.0)
MCV: 90 fL (ref 80.0–100.0)
Platelets: 71 10*3/uL — ABNORMAL LOW (ref 150–400)
RBC: 2.9 MIL/uL — ABNORMAL LOW (ref 4.22–5.81)
RDW: 13.8 % (ref 11.5–15.5)
WBC: 6.6 10*3/uL (ref 4.0–10.5)
nRBC: 0 % (ref 0.0–0.2)

## 2018-12-14 LAB — GLUCOSE, CAPILLARY
Glucose-Capillary: 113 mg/dL — ABNORMAL HIGH (ref 70–99)
Glucose-Capillary: 115 mg/dL — ABNORMAL HIGH (ref 70–99)
Glucose-Capillary: 123 mg/dL — ABNORMAL HIGH (ref 70–99)
Glucose-Capillary: 86 mg/dL (ref 70–99)
Glucose-Capillary: 89 mg/dL (ref 70–99)

## 2018-12-14 LAB — BASIC METABOLIC PANEL
Anion gap: 8 (ref 5–15)
BUN: 24 mg/dL — ABNORMAL HIGH (ref 8–23)
CO2: 25 mmol/L (ref 22–32)
Calcium: 8.1 mg/dL — ABNORMAL LOW (ref 8.9–10.3)
Chloride: 106 mmol/L (ref 98–111)
Creatinine, Ser: 0.91 mg/dL (ref 0.61–1.24)
GFR calc Af Amer: >60 mL/min (ref >60)
GFR calc non Af Amer: >60 mL/min (ref >60)
Glucose, Bld: 123 mg/dL — ABNORMAL HIGH (ref 70–99)
Potassium: 4.2 mmol/L (ref 3.5–5.1)
Sodium: 139 mmol/L (ref 135–145)

## 2018-12-14 LAB — POTASSIUM: Potassium: 3.6 mmol/L (ref 3.5–5.1)

## 2018-12-14 MED ORDER — CLOPIDOGREL BISULFATE 75 MG PO TABS
75.0000 mg | ORAL_TABLET | Freq: Every day | ORAL | Status: DC
  Administered 2018-12-14 – 2018-12-21 (×8): 75 mg via ORAL
  Filled 2018-12-14 (×8): qty 1

## 2018-12-14 MED ORDER — COLCHICINE 0.6 MG PO TABS
0.6000 mg | ORAL_TABLET | Freq: Every day | ORAL | Status: DC
  Administered 2018-12-14 – 2018-12-21 (×8): 0.6 mg via ORAL
  Filled 2018-12-14 (×8): qty 1

## 2018-12-14 MED ORDER — INSULIN ASPART 100 UNIT/ML ~~LOC~~ SOLN
0.0000 [IU] | SUBCUTANEOUS | Status: DC
  Administered 2018-12-14: 2 [IU] via SUBCUTANEOUS

## 2018-12-14 MED ORDER — AMIODARONE HCL 200 MG PO TABS
400.0000 mg | ORAL_TABLET | Freq: Two times a day (BID) | ORAL | Status: DC
  Administered 2018-12-14 – 2018-12-16 (×6): 400 mg via ORAL
  Filled 2018-12-14 (×6): qty 2

## 2018-12-14 MED ORDER — FUROSEMIDE 10 MG/ML IJ SOLN
60.0000 mg | Freq: Two times a day (BID) | INTRAMUSCULAR | Status: DC
  Administered 2018-12-14 (×2): 60 mg via INTRAVENOUS
  Filled 2018-12-14 (×3): qty 6

## 2018-12-14 MED ORDER — ALBUMIN HUMAN 5 % IV SOLN
12.5000 g | Freq: Once | INTRAVENOUS | Status: AC
  Administered 2018-12-14: 12.5 g via INTRAVENOUS

## 2018-12-14 MED ORDER — CARVEDILOL 3.125 MG PO TABS
3.1250 mg | ORAL_TABLET | Freq: Two times a day (BID) | ORAL | Status: DC
  Administered 2018-12-14 – 2018-12-16 (×6): 3.125 mg via ORAL
  Filled 2018-12-14 (×6): qty 1

## 2018-12-14 MED ORDER — ASPIRIN EC 81 MG PO TBEC
81.0000 mg | DELAYED_RELEASE_TABLET | Freq: Every day | ORAL | Status: DC
  Administered 2018-12-14 – 2018-12-21 (×8): 81 mg via ORAL
  Filled 2018-12-14 (×8): qty 1

## 2018-12-14 MED ORDER — POTASSIUM CHLORIDE 10 MEQ/50ML IV SOLN
10.0000 meq | INTRAVENOUS | Status: AC
  Administered 2018-12-14 – 2018-12-15 (×3): 10 meq via INTRAVENOUS
  Filled 2018-12-14 (×3): qty 50

## 2018-12-14 MED ORDER — ALBUMIN HUMAN 5 % IV SOLN
INTRAVENOUS | Status: AC
  Filled 2018-12-14: qty 250

## 2018-12-14 NOTE — Progress Notes (Signed)
2 Days Post-Op Procedure(s) (LRB): Mediastinal Re-Exploration after bypass graft (N/A) Subjective: Feeling better; no specific complaints except general pain  Objective: Vital signs in last 24 hours: Temp:  [98.1 F (36.7 C)-98.4 F (36.9 C)] 98.2 F (36.8 C) (11/20 0800) Pulse Rate:  [80-90] 89 (11/20 0700) Cardiac Rhythm: Atrial paced (11/20 0000) Resp:  [0-27] 19 (11/20 0700) BP: (96-122)/(55-81) 106/59 (11/20 0700) SpO2:  [89 %-100 %] 95 % (11/20 0700) Arterial Line BP: (90-141)/(42-59) 108/51 (11/20 0700) Weight:  [113.3 kg] 113.3 kg (11/20 0500)  Hemodynamic parameters for last 24 hours: PAP: (22-25)/(12-13) 25/13  Intake/Output from previous day: 11/19 0701 - 11/20 0700 In: 3328.6 [I.V.:3005.4; Blood:120; IV Piggyback:203.2] Out: 3400 [Urine:2520; Chest Tube:880] Intake/Output this shift: No intake/output data recorded.  General appearance: alert and cooperative Neurologic: intact Heart: regular rate and rhythm, S1, S2 normal, no murmur, click, rub or gallop Lungs: clear to auscultation bilaterally Abdomen: soft, non-tender; bowel sounds normal; no masses,  no organomegaly Extremities: extremities normal, atraumatic, no cyanosis or edema Wound: dressed/dry  Lab Results: Recent Labs    12/13/18 1842 12/14/18 0451  WBC 7.0 6.6  HGB 9.1* 8.8*  HCT 27.2* 26.1*  PLT 81* 71*   BMET:  Recent Labs    12/13/18 1842 12/14/18 0451  NA 140 139  K 4.4 4.2  CL 109 106  CO2 24 25  GLUCOSE 127* 123*  BUN 24* 24*  CREATININE 0.98 0.91  CALCIUM 7.9* 8.1*    PT/INR:  Recent Labs    12/12/18 2234  LABPROT 20.9*  INR 1.8*   ABG    Component Value Date/Time   PHART 7.351 12/13/2018 0659   HCO3 22.2 12/13/2018 0659   TCO2 23 12/13/2018 0659   ACIDBASEDEF 3.0 (H) 12/13/2018 0659   O2SAT 98.0 12/13/2018 0659   CBG (last 3)  Recent Labs    12/13/18 2327 12/14/18 0030 12/14/18 0507  GLUCAP 104* 89 113*    Assessment/Plan: S/P Procedure(s)  (LRB): Mediastinal Re-Exploration after bypass graft (N/A) Mobilize Diuresis pt/ot eval   LOS: 2 days    [name] 12/14/2018

## 2018-12-14 NOTE — Progress Notes (Signed)
Inpatient Rehab Admissions:  Inpatient Rehab Consult received.  I met with pt at the bedside for rehabilitation assessment. We discussed program details and what he would expected if he were to be admitted to CIR. Pt does show an interest in CIR at this time. He does have concerns over payment for CIR as he is currently uninsured. He asked that I follow up with his Development worker, community. He gave me permission to speak with his wife to confirm discharge support. We discussed that he is early on in his recovery process and we will continue to follow him as he progresses on the acute side to see if he still needs an IP Rehab program once he is medically ready.   All questions answered at this time. Will continue to follow.   Jhonnie Garner, OTR/L  Rehab Admissions Coordinator  [phone] 12/14/2018 10:44 AM

## 2018-12-14 NOTE — Plan of Care (Signed)
  Problem: Cardiac: Goal: Will achieve and/or maintain hemodynamic stability Outcome: Progressing   Problem: Respiratory: Goal: Respiratory status will improve Outcome: Progressing   Problem: Urinary Elimination: Goal: Ability to achieve and maintain adequate renal perfusion and functioning will improve Outcome: Progressing

## 2018-12-14 NOTE — Progress Notes (Signed)
Patient ID: Frank Love, male   DOB: 1955/03/29, 63 y.o.   MRN: [id number] EVENING ROUNDS NOTE :     [address]       [place] [address]             [phone]                 2 Days Post-Op Procedure(s) (LRB): Mediastinal Re-Exploration after bypass graft (N/A)  Total Length of Stay:  LOS: 2 days  BP 101/60   Pulse 90   Temp 98 F (36.7 C) (Oral)   Resp 16   Ht 6\' 3"  (1.905 m)   Wt 113.3 kg   SpO2 98%   BMI 31.22 kg/m   .Intake/Output      11/19 0701 - 11/20 0700 11/20 0701 - 11/21 0700   I.V. (mL/kg) 3005.4 (26.5) 675.1 (6)   Blood 120    IV Piggyback 203.2 100   Total Intake(mL/kg) 3328.6 (29.4) 775.1 (6.8)   Urine (mL/kg/hr) 2520 (0.9) 2455 (2)   Blood     Chest Tube 880 395   Total Output 3400 2850   Net -71.4 -2074.9          . sodium chloride    . sodium chloride    . sodium chloride 20 mL/hr at 12/12/18 1450  . amiodarone Stopped (12/13/18 1323)  . cefUROXime (ZINACEF)  IV Stopped (12/14/18 1044)  . DOPamine 2.5 mcg/kg/min (12/14/18 1500)  . lactated ringers    . lactated ringers    . lactated ringers 75 mL/hr at 12/13/18 0750  . lactated ringers 10 mL/hr at 12/14/18 1651  . vasopressin (PITRESSIN) infusion - *FOR SHOCK* 0.02 Units/min (12/14/18 1500)     Lab Results  Component Value Date   WBC 6.6 12/14/2018   HGB 8.8 (L) 12/14/2018   HCT 26.1 (L) 12/14/2018   PLT 71 (L) 12/14/2018   GLUCOSE 123 (H) 12/14/2018   CHOL 182 08/03/2018   TRIG 64 08/03/2018   HDL 53 08/03/2018   LDLCALC 116 (H) 08/03/2018   ALT 16 12/11/2018   AST 19 12/11/2018   NA 139 12/14/2018   K 4.2 12/14/2018   CL 106 12/14/2018   CREATININE 0.91 12/14/2018   BUN 24 (H) 12/14/2018   CO2 25 12/14/2018   PSA 0.90 04/17/2015   INR 1.8 (H) 12/12/2018   HGBA1C 5.0 12/11/2018   Stable day On vasopressin and dopamine neurointact   [name] MD  Beeper [phone] Office [phone] 12/14/2018 6:00 PM

## 2018-12-14 NOTE — Evaluation (Signed)
Occupational Therapy Evaluation Patient Details Name: Frank Love MRN: [id number] DOB: 05/23/1955 Today's Date: 12/14/2018    History of Present Illness Pt adm for CABG x 4 on 11/18. Underwent urgent mediastinal exploration same day and found small bleed from one graft which was repaired with 1 suture. PMH - severe rt hip arthritis (was scheduled for THR), CAD, HTN   Clinical Impression   This 63 yo male admitted and underwent above presents to acute OT with pain, sternal precautions, decreased mobility, decreased balance all affecting his safety and independence with basic ADLs. PTA pt was independent with basic ADLs and now he is setup/S-total A. He will benefit from acute OT with follow up on CIR to get to a Mod I level.     Follow Up Recommendations  CIR;Supervision/Assistance - 24 hour    Equipment Recommendations  Other (comment)(TBD next venue)       Precautions / Restrictions Precautions Precautions: Fall;Sternal Restrictions Weight Bearing Restrictions: No      Mobility Bed Mobility               General bed mobility comments: Pt up in recliner upon arrival  Transfers Overall transfer level: Needs assistance Equipment used: 2 person hand held assist Transfers: Sit to/from Stand Sit to Stand: Max assist;+2 physical assistance         General transfer comment: Pt with Mod A +2 sit<>stand from recliner with rocking momentum. However pt could not move his legs to turn to bed without putting too much pressure on his arms on RW, so had to move recliner so when he stood 2nd time we could move reclienr and moved bed up behind him. Sit>stand x2 from recliner and x1 from raised bed    Balance Overall balance assessment: Needs assistance Sitting-balance support: No upper extremity supported;Feet supported Sitting balance-Leahy Scale: Fair     Standing balance support: Bilateral upper extremity supported Standing balance-Leahy Scale: Poor                              ADL either performed or assessed with clinical judgement   ADL Overall ADL's : Needs assistance/impaired Eating/Feeding: Set up;Sitting   Grooming: Minimal assistance;Sitting   Upper Body Bathing: Moderate assistance;Sitting   Lower Body Bathing: Total assistance Lower Body Bathing Details (indicate cue type and reason): Mod A +2 sit<>stand from recliner with momentum Upper Body Dressing : Maximal assistance;Sitting   Lower Body Dressing: Total assistance Lower Body Dressing Details (indicate cue type and reason): Mod A +2 sit<>stand from recliner with momentum   Toilet Transfer Details (indicate cue type and reason): sit<>stand from recliner and bring bed up behind him due to pt could not stand turn with RW due to putting too much pressure through arms to try and advance feet Toileting- Clothing Manipulation and Hygiene: Total assistance Toileting - Clothing Manipulation Details (indicate cue type and reason): Mod A +2 sit<>stand from recliner with momentum             Vision Patient Visual Report: No change from baseline              Pertinent Vitals/Pain Pain Assessment: Faces Faces Pain Scale: Hurts even more Pain Location: chest, sternal incision Pain Descriptors / Indicators: Aching;Sore Pain Intervention(s): Limited activity within patient's tolerance;Monitored during session;Repositioned     Hand Dominance Right   Extremity/Trunk Assessment Upper Extremity Assessment Upper Extremity Assessment: Generalized weakness  Communication Communication Communication: No difficulties   Cognition Arousal/Alertness: Awake/alert Behavior During Therapy: WFL for tasks assessed/performed Overall Cognitive Status: Within Functional Limits for tasks assessed                                                Home Living Family/patient expects to be discharged to:: Private residence Living Arrangements:  Spouse/significant other Available Help at Discharge: Family;Available 24 hours/day Type of Home: Apartment Home Access: Stairs to enter Entrance Stairs-Number of Steps: 3+5 Entrance Stairs-Rails: Right Home Layout: One level     Bathroom Shower/Tub: Tub/shower unit         Home Equipment: Environmental consultant - 2 wheels;Cane - single point   Additional Comments: Pt sleeps in armchair with feet on ottoman      Prior Functioning/Environment Level of Independence: Independent with assistive device(s)        Comments: Used cane due to bad rt hip. Wife reports several falls at home        OT Problem List: Decreased strength;Decreased range of motion;Impaired balance (sitting and/or standing);Pain;Decreased knowledge of precautions;Decreased safety awareness      OT Treatment/Interventions: Self-care/ADL training;DME and/or AE instruction;Patient/family education;Balance training    OT Goals(Current goals can be found in the care plan section) Acute Rehab OT Goals Patient Stated Goal: to get over this so I can have my hip surgery OT Goal Formulation: With patient Time For Goal Achievement: 12/28/18 Potential to Achieve Goals: Good  OT Frequency: Min 2X/week              AM-PAC OT "6 Clicks" Daily Activity     Outcome Measure Help from another person eating meals?: A Little Help from another person taking care of personal grooming?: A Lot Help from another person toileting, which includes using toliet, bedpan, or urinal?: Total Help from another person bathing (including washing, rinsing, drying)?: A Lot Help from another person to put on and taking off regular upper body clothing?: A Lot Help from another person to put on and taking off regular lower body clothing?: Total 6 Click Score: 8   End of Session Equipment Utilized During Treatment: Gait belt;Rolling walker Nurse Communication: Mobility status  Activity Tolerance: Patient tolerated treatment well Patient left: in  bed;with call bell/phone within reach;with bed alarm set  OT Visit Diagnosis: Unsteadiness on feet (R26.81);Other abnormalities of gait and mobility (R26.89);Muscle weakness (generalized) (M62.81);Pain Pain - Right/Left: (chest-sternal and right hip)                Time: [id number] OT Time Calculation (min): 31 min Charges:  OT General Charges $OT Visit: 1 Visit OT Evaluation $OT Eval Moderate Complexity: 1 Mod OT Treatments $Self Care/Home Management : 8-22 mins  [place], OTR/L Acute [employer] Pager [phone] Office [phone]     Almon Register 12/14/2018, 5:17 PM

## 2018-12-14 NOTE — Progress Notes (Signed)
CRITICAL VALUE ALERT  Critical Value: R Apical Pneumothorax  Date & Time Notied:  12/14/18 08:50  Provider Notified: Dr. Orvan Seen  Orders Received/Actions taken: no orders received

## 2018-12-15 ENCOUNTER — Inpatient Hospital Stay (HOSPITAL_COMMUNITY): Payer: No Typology Code available for payment source

## 2018-12-15 LAB — BASIC METABOLIC PANEL
Anion gap: 9 (ref 5–15)
BUN: 27 mg/dL — ABNORMAL HIGH (ref 8–23)
CO2: 29 mmol/L (ref 22–32)
Calcium: 8 mg/dL — ABNORMAL LOW (ref 8.9–10.3)
Chloride: 101 mmol/L (ref 98–111)
Creatinine, Ser: 1.04 mg/dL (ref 0.61–1.24)
GFR calc Af Amer: >60 mL/min (ref >60)
GFR calc non Af Amer: >60 mL/min (ref >60)
Glucose, Bld: 114 mg/dL — ABNORMAL HIGH (ref 70–99)
Potassium: 4 mmol/L (ref 3.5–5.1)
Sodium: 139 mmol/L (ref 135–145)

## 2018-12-15 LAB — CBC WITH DIFFERENTIAL/PLATELET
Abs Immature Granulocytes: 0.05 10*3/uL (ref 0.00–0.07)
Basophils Absolute: 0 10*3/uL (ref 0.0–0.1)
Basophils Relative: 1 %
Eosinophils Absolute: 0.3 10*3/uL (ref 0.0–0.5)
Eosinophils Relative: 4 %
HCT: 23.6 % — ABNORMAL LOW (ref 39.0–52.0)
Hemoglobin: 7.8 g/dL — ABNORMAL LOW (ref 13.0–17.0)
Immature Granulocytes: 1 %
Lymphocytes Relative: 10 %
Lymphs Abs: 0.6 10*3/uL — ABNORMAL LOW (ref 0.7–4.0)
MCH: 30.1 pg (ref 26.0–34.0)
MCHC: 33.1 g/dL (ref 30.0–36.0)
MCV: 91.1 fL (ref 80.0–100.0)
Monocytes Absolute: 0.4 10*3/uL (ref 0.1–1.0)
Monocytes Relative: 6 %
Neutro Abs: 4.8 10*3/uL (ref 1.7–7.7)
Neutrophils Relative %: 78 %
Platelets: 72 10*3/uL — ABNORMAL LOW (ref 150–400)
RBC: 2.59 MIL/uL — ABNORMAL LOW (ref 4.22–5.81)
RDW: 13.4 % (ref 11.5–15.5)
WBC: 6.2 10*3/uL (ref 4.0–10.5)
nRBC: 0 % (ref 0.0–0.2)

## 2018-12-15 LAB — GLUCOSE, CAPILLARY
Glucose-Capillary: 102 mg/dL — ABNORMAL HIGH (ref 70–99)
Glucose-Capillary: 105 mg/dL — ABNORMAL HIGH (ref 70–99)
Glucose-Capillary: 106 mg/dL — ABNORMAL HIGH (ref 70–99)
Glucose-Capillary: 106 mg/dL — ABNORMAL HIGH (ref 70–99)
Glucose-Capillary: 118 mg/dL — ABNORMAL HIGH (ref 70–99)
Glucose-Capillary: 89 mg/dL (ref 70–99)
Glucose-Capillary: 99 mg/dL (ref 70–99)

## 2018-12-15 MED ORDER — ALBUMIN HUMAN 5 % IV SOLN
12.5000 g | Freq: Once | INTRAVENOUS | Status: AC
  Administered 2018-12-15: 19:00:00 12.5 g via INTRAVENOUS

## 2018-12-15 MED ORDER — ALBUMIN HUMAN 5 % IV SOLN
INTRAVENOUS | Status: AC
  Administered 2018-12-15: 12.5 g via INTRAVENOUS
  Filled 2018-12-15: qty 250

## 2018-12-15 MED ORDER — INSULIN ASPART 100 UNIT/ML ~~LOC~~ SOLN: 0.0000 [IU] | Freq: Three times a day (TID) | SUBCUTANEOUS | Status: DC

## 2018-12-15 MED ORDER — PHENYLEPHRINE HCL-NACL 10-0.9 MG/250ML-% IV SOLN
0.0000 ug/min | INTRAVENOUS | Status: DC
  Administered 2018-12-15: 1 ug/min via INTRAVENOUS

## 2018-12-15 NOTE — Plan of Care (Signed)
  Problem: Education: Goal: Knowledge of General Education information will improve Description: Including pain rating scale, medication(s)/side effects and non-pharmacologic comfort measures Outcome: Progressing   Problem: Health Behavior/Discharge Planning: Goal: Ability to manage health-related needs will improve Outcome: Progressing   Problem: Clinical Measurements: Goal: Ability to maintain clinical measurements within normal limits will improve Outcome: Progressing Goal: Will remain free from infection Outcome: Progressing Goal: Diagnostic test results will improve Outcome: Progressing Goal: Respiratory complications will improve Outcome: Progressing Goal: Cardiovascular complication will be avoided Outcome: Progressing   Problem: Nutrition: Goal: Adequate nutrition will be maintained Outcome: Progressing   Problem: Elimination: Goal: Will not experience complications related to bowel motility Outcome: Progressing Goal: Will not experience complications related to urinary retention Outcome: Progressing   Problem: Pain Managment: Goal: General experience of comfort will improve Outcome: Progressing   Problem: Safety: Goal: Ability to remain free from injury will improve Outcome: Progressing   Problem: Skin Integrity: Goal: Risk for impaired skin integrity will decrease Outcome: Progressing   Problem: Education: Goal: Knowledge of disease or condition will improve Outcome: Progressing Goal: Knowledge of the prescribed therapeutic regimen will improve Outcome: Progressing   Problem: Clinical Measurements: Goal: Postoperative complications will be avoided or minimized Outcome: Progressing   Problem: Respiratory: Goal: Respiratory status will improve Outcome: Progressing   Problem: Skin Integrity: Goal: Wound healing without signs and symptoms of infection Outcome: Progressing Goal: Risk for impaired skin integrity will decrease Outcome: Progressing    Problem: Activity: Goal: Risk for activity intolerance will decrease Outcome: Not Progressing Note: DT pain/injury to pt's R hip   Problem: Coping: Goal: Level of anxiety will decrease Outcome: Not Progressing   Problem: Cardiac: Goal: Will achieve and/or maintain hemodynamic stability Outcome: Not Progressing Note: Increased need for blood pressure support at night

## 2018-12-15 NOTE — Progress Notes (Signed)
Patient ID: Frank Love, male   DOB: [birth year], 63 y.o.   MRN: [id number] EVENING ROUNDS NOTE :     [address]       [place] [address]             [phone]                 3 Days Post-Op Procedure(s) (LRB): Mediastinal Re-Exploration after bypass graft (N/A)  Total Length of Stay:  LOS: 3 days  BP (!) 93/53   Pulse (!) 54   Temp 98.5 F (36.9 C) (Oral)   Resp 20   Ht 6\' 3"  (1.905 m)   Wt 109.9 kg   SpO2 99%   BMI 30.28 kg/m   .Intake/Output      11/21 0701 - 11/22 0700   P.O.    I.V. (mL/kg) 257.2 (2.3)   IV Piggyback    Total Intake(mL/kg) 257.2 (2.3)   Urine (mL/kg/hr) 715 (0.5)   Chest Tube 200   Total Output 915   Net -657.8         . sodium chloride    . sodium chloride    . sodium chloride 20 mL/hr at 12/12/18 1450  . amiodarone Stopped (12/13/18 1323)  . DOPamine 3 mcg/kg/min (12/15/18 1900)  . lactated ringers    . lactated ringers    . lactated ringers 75 mL/hr at 12/13/18 0750  . lactated ringers 10 mL/hr at 12/15/18 1900     Lab Results  Component Value Date   WBC 6.2 12/15/2018   HGB 7.8 (L) 12/15/2018   HCT 23.6 (L) 12/15/2018   PLT 72 (L) 12/15/2018   GLUCOSE 114 (H) 12/15/2018   CHOL 182 08/03/2018   TRIG 64 08/03/2018   HDL 53 08/03/2018   LDLCALC 116 (H) 08/03/2018   ALT 16 12/11/2018   AST 19 12/11/2018   NA 139 12/15/2018   K 4.0 12/15/2018   CL 101 12/15/2018   CREATININE 1.04 12/15/2018   BUN 27 (H) 12/15/2018   CO2 29 12/15/2018   PSA 0.90 04/17/2015   INR 1.8 (H) 12/12/2018   HGBA1C 5.0 12/11/2018   On doapmine, bp better after some volume    [name] MD  Beeper [phone] Office [phone] 12/15/2018 7:11 PM

## 2018-12-15 NOTE — Progress Notes (Signed)
Patient ID: Frank Love, male   DOB: [birth year], 63 y.o.   MRN: [id number] TCTS DAILY ICU PROGRESS NOTE                   [address]            [place] [address]          [phone]   3 Days Post-Op Procedure(s) (LRB): Mediastinal Re-Exploration after bypass graft (N/A)  Coronary Artery Bypass Grafting x 4   Left Internal Mammary Artery to Distal Left Anterior Descending Coronary Artery; pedicled RIMA Graft to  Posterior Descending Coronary Artery; Saphenous Vein Graft to 1st Obtuse Marginal Branch of Left Circumflex Coronary Artery; Sapheonous Vein Graft to acute margional Branch Right Coronary Artery; Endoscopic Vein Harvest from right Thigh;  Bilateral IMA harvesting Completion graft surveillance with indocyanine green fluorescence imaging (SPY) Rigid sternal reconstruction with linear plating system (KLS)  Total Length of Stay:  LOS: 3 days   Subjective: Patient awake alert neurologically intact, had good urine output with IV Lasix 60 mg twice yesterday, dropped his blood pressure during the night and required fluid bolus.  Objective: Vital signs in last 24 hours: Temp:  [98 F (36.7 C)-98.6 F (37 C)] 98.2 F (36.8 C) (11/21 0817) Pulse Rate:  [33-92] 49 (11/21 0800) Cardiac Rhythm: Atrial fibrillation (11/21 0730) Resp:  [12-27] 27 (11/21 0800) BP: (80-122)/(47-75) 90/61 (11/21 0800) SpO2:  [91 %-100 %] 100 % (11/21 0800) Arterial Line BP: (84-121)/(42-60) 89/50 (11/21 0800) Weight:  [109.9 kg] 109.9 kg (11/21 0500)  Filed Weights   12/13/18 0300 12/14/18 0500 12/15/18 0500  Weight: 114.1 kg 113.3 kg 109.9 kg    Weight change: -3.4 kg   Hemodynamic parameters for last 24 hours:    Intake/Output from previous day: 11/20 0701 - 11/21 0700 In: 1787 [P.O.:260; I.V.:986.2; IV Piggyback:540.9] Out: 5340 [Urine:4595; Chest Tube:745]  Intake/Output this shift: Total I/O In: 45.7 [I.V.:45.7] Out: 100 [Urine:100]  Current Meds:  Scheduled Meds: . sodium chloride   Intravenous Once  . sodium chloride   Intravenous Once  . sodium chloride   Intravenous Once  . sodium chloride   Intravenous Once  . acetaminophen  1,000 mg Oral Q6H   Or  . acetaminophen (TYLENOL) oral liquid 160 mg/5 mL  1,000 mg Per Tube Q6H  . amiodarone  400 mg Oral BID  . aspirin EC  81 mg Oral Daily  . bisacodyl  10 mg Oral Daily   Or  . bisacodyl  10 mg Rectal Daily  . carvedilol  3.125 mg Oral BID WC  . Chlorhexidine Gluconate Cloth  6 each Topical Daily  . clopidogrel  75 mg Oral Daily  . colchicine  0.6 mg Oral Daily  . docusate sodium  200 mg Oral Daily  . furosemide  60 mg Intravenous BID  . insulin aspart  0-24 Units Subcutaneous Q4H  . mouth rinse  15 mL Mouth Rinse BID  . pantoprazole  40 mg Oral Daily   Continuous Infusions: . sodium chloride    . sodium chloride    . sodium chloride 20 mL/hr at 12/12/18 1450  . albumin human    . amiodarone Stopped (12/13/18 1323)  . DOPamine 2.5 mcg/kg/min (12/15/18 0800)  . lactated ringers    . lactated ringers    . lactated ringers 75 mL/hr at 12/13/18 0750  . lactated ringers 10 mL/hr at 12/15/18 0800  . vasopressin (PITRESSIN) infusion - *FOR SHOCK* 0.02 Units/min (12/15/18  0800)   PRN Meds:.sodium chloride, lactated ringers, ondansetron (ZOFRAN) IV, oxyCODONE, promethazine, sodium chloride flush, sodium chloride flush, traMADol, white petrolatum  General appearance: alert, cooperative and no distress Neurologic: intact Heart: Currently in atrial fib controlled rate S1, S2 normal, no murmur, click, rub or gallop Lungs: diminished breath sounds bibasilar Abdomen: soft, non-tender; bowel sounds normal; no masses,  no organomegaly Extremities: extremities normal, atraumatic, no cyanosis or edema and Homans sign is negative, no sign of DVT Wound: Sternal dressing intact  Lab Results: CBC: Recent Labs    12/14/18 0451 12/15/18 0406  WBC 6.6 6.2  HGB 8.8* 7.8*  HCT 26.1*  23.6*  PLT 71* 72*   BMET:  Recent Labs    12/14/18 0451 12/14/18 2231 12/15/18 0406  NA 139  --  139  K 4.2 3.6 4.0  CL 106  --  101  CO2 25  --  29  GLUCOSE 123*  --  114*  BUN 24*  --  27*  CREATININE 0.91  --  1.04  CALCIUM 8.1*  --  8.0*    CMET: Lab Results  Component Value Date   WBC 6.2 12/15/2018   HGB 7.8 (L) 12/15/2018   HCT 23.6 (L) 12/15/2018   PLT 72 (L) 12/15/2018   GLUCOSE 114 (H) 12/15/2018   CHOL 182 08/03/2018   TRIG 64 08/03/2018   HDL 53 08/03/2018   LDLCALC 116 (H) 08/03/2018   ALT 16 12/11/2018   AST 19 12/11/2018   NA 139 12/15/2018   K 4.0 12/15/2018   CL 101 12/15/2018   CREATININE 1.04 12/15/2018   BUN 27 (H) 12/15/2018   CO2 29 12/15/2018   PSA 0.90 04/17/2015   INR 1.8 (H) 12/12/2018   HGBA1C 5.0 12/11/2018      PT/INR:  Recent Labs    12/12/18 2234  LABPROT 20.9*  INR 1.8*   Radiology: No results found.   Assessment/Plan: S/P Procedure(s) (LRB): Mediastinal Re-Exploration after bypass graft (N/A) Mobilize Thrombocytopenia-77,000 Expected Acute  Blood - loss Anemia- continue to monitor  Chest tube output 350 overnight, will leave in currently Urine output for 24 hours was greater than 4 L, cut back on Lasix dosage Wean vasopressin, continue dopamine for now Renal function stable  Frank Love 12/15/2018 9:47 AM

## 2018-12-16 ENCOUNTER — Inpatient Hospital Stay (HOSPITAL_COMMUNITY): Payer: No Typology Code available for payment source

## 2018-12-16 LAB — BASIC METABOLIC PANEL
Anion gap: 9 (ref 5–15)
BUN: 34 mg/dL — ABNORMAL HIGH (ref 8–23)
CO2: 27 mmol/L (ref 22–32)
Calcium: 8 mg/dL — ABNORMAL LOW (ref 8.9–10.3)
Chloride: 104 mmol/L (ref 98–111)
Creatinine, Ser: 0.91 mg/dL (ref 0.61–1.24)
GFR calc Af Amer: >60 mL/min (ref >60)
GFR calc non Af Amer: >60 mL/min (ref >60)
Glucose, Bld: 100 mg/dL — ABNORMAL HIGH (ref 70–99)
Potassium: 4 mmol/L (ref 3.5–5.1)
Sodium: 140 mmol/L (ref 135–145)

## 2018-12-16 LAB — CBC
HCT: 24.6 % — ABNORMAL LOW (ref 39.0–52.0)
Hemoglobin: 7.9 g/dL — ABNORMAL LOW (ref 13.0–17.0)
MCH: 30.6 pg (ref 26.0–34.0)
MCHC: 32.1 g/dL (ref 30.0–36.0)
MCV: 95.3 fL (ref 80.0–100.0)
Platelets: 60 10*3/uL — ABNORMAL LOW (ref 150–400)
RBC: 2.58 MIL/uL — ABNORMAL LOW (ref 4.22–5.81)
RDW: 13.6 % (ref 11.5–15.5)
WBC: 5.8 10*3/uL (ref 4.0–10.5)
nRBC: 0 % (ref 0.0–0.2)

## 2018-12-16 LAB — GLUCOSE, CAPILLARY
Glucose-Capillary: 99 mg/dL (ref 70–99)
Glucose-Capillary: 100 mg/dL — ABNORMAL HIGH (ref 70–99)
Glucose-Capillary: 95 mg/dL (ref 70–99)
Glucose-Capillary: 108 mg/dL — ABNORMAL HIGH (ref 70–99)

## 2018-12-16 MED ORDER — FUROSEMIDE 10 MG/ML IJ SOLN
40.0000 mg | Freq: Once | INTRAMUSCULAR | Status: AC
  Administered 2018-12-16: 40 mg via INTRAVENOUS
  Filled 2018-12-16: qty 4

## 2018-12-16 NOTE — Progress Notes (Signed)
Patient ID: Frank Love, male   DOB: 1955/03/26, 63 y.o.   MRN: [id number] EVENING ROUNDS NOTE :     [address]       [place], [address]             [phone]                 4 Days Post-Op Procedure(s) (LRB): Mediastinal Re-Exploration after bypass graft (N/A)  Total Length of Stay:  LOS: 4 days  BP (!) 102/55   Pulse 64   Temp 98.5 F (36.9 C)   Resp (!) 21   Ht 6\' 3"  (1.905 m)   Wt 109.3 kg   SpO2 100%   BMI 30.11 kg/m   .Intake/Output      11/21 0701 - 11/22 0700 11/22 0701 - 11/23 0700   P.O. 220 480   I.V. (mL/kg) 478.2 (4.4) 189.3 (1.7)   IV Piggyback     Total Intake(mL/kg) 698.2 (6.4) 669.3 (6.1)   Urine (mL/kg/hr) 1235 (0.5) 1810 (1.5)   Stool 0    Chest Tube 400 170   Total Output 1635 1980   Net -936.9 -1310.7        Stool Occurrence 1 x      . DOPamine 3 mcg/kg/min (12/16/18 1800)  . lactated ringers 75 mL/hr at 12/13/18 0750  . lactated ringers 10 mL/hr at 12/16/18 1800  . phenylephrine (NEO-SYNEPHRINE) Adult infusion Stopped (12/15/18 2334)     Lab Results  Component Value Date   WBC 5.8 12/16/2018   HGB 7.9 (L) 12/16/2018   HCT 24.6 (L) 12/16/2018   PLT 60 (L) 12/16/2018   GLUCOSE 100 (H) 12/16/2018   CHOL 182 08/03/2018   TRIG 64 08/03/2018   HDL 53 08/03/2018   LDLCALC 116 (H) 08/03/2018   ALT 16 12/11/2018   AST 19 12/11/2018   NA 140 12/16/2018   K 4.0 12/16/2018   CL 104 12/16/2018   CREATININE 0.91 12/16/2018   BUN 34 (H) 12/16/2018   CO2 27 12/16/2018   PSA 0.90 04/17/2015   INR 1.8 (H) 12/12/2018   HGBA1C 5.0 12/11/2018   Chest tubes out bp stable Good uop with lasix    [name] MD  Beeper [phone] Office [phone] 12/16/2018 6:14 PM

## 2018-12-16 NOTE — Progress Notes (Signed)
Patient ID: Frank Love, male   DOB: 1955-03-15, 63 y.o.   MRN: [id number] TCTS DAILY ICU PROGRESS NOTE                   Frank [address]            [place] [address]          [phone]   4 Days Post-Op Procedure(s) (LRB): Mediastinal Re-Exploration after bypass graft (N/A)  Total Length of Stay:  LOS: 4 days   Subjective: Patient up in chair feels much better today, blood pressure stable, returned to sinus rhythm  Objective: Vital signs in last 24 hours: Temp:  [98.3 F (36.8 C)-98.5 F (36.9 C)] 98.3 F (36.8 C) (11/22 0000) Pulse Rate:  [38-143] 59 (11/22 1015) Cardiac Rhythm: Normal sinus rhythm (11/22 0730) Resp:  [13-45] 17 (11/22 1015) BP: (76-117)/(50-70) 106/58 (11/22 1000) SpO2:  [90 %-100 %] 100 % (11/22 1015) Arterial Line BP: (87-137)/(37-54) 128/53 (11/22 1015)  Filed Weights   12/13/18 0300 12/14/18 0500 12/15/18 0500  Weight: 114.1 kg 113.3 kg 109.9 kg    Weight change:    Hemodynamic parameters for last 24 hours:    Intake/Output from previous day: 11/21 0701 - 11/22 0700 In: 698.2 [P.O.:220; I.V.:478.2] Out: [id number] [Urine:1235; Chest Tube:400]  Intake/Output this shift: Total I/O In: 175.7 [P.O.:120; I.V.:55.7] Out: 235 [Urine:135; Chest Tube:100]  Current Meds: Scheduled Meds: . acetaminophen  1,000 mg Oral Q6H   Or  . acetaminophen (TYLENOL) oral liquid 160 mg/5 mL  1,000 mg Per Tube Q6H  . amiodarone  400 mg Oral BID  . aspirin EC  81 mg Oral Daily  . bisacodyl  10 mg Oral Daily   Or  . bisacodyl  10 mg Rectal Daily  . carvedilol  3.125 mg Oral BID WC  . Chlorhexidine Gluconate Cloth  6 each Topical Daily  . clopidogrel  75 mg Oral Daily  . colchicine  0.6 mg Oral Daily  . docusate sodium  200 mg Oral Daily  . insulin aspart  0-24 Units Subcutaneous TID AC & HS  . mouth rinse  15 mL Mouth Rinse BID  . pantoprazole  40 mg Oral Daily   Continuous Infusions: . DOPamine 4 mcg/kg/min (12/16/18 1000)  . lactated  ringers 75 mL/hr at 12/13/18 0750  . lactated ringers 10 mL/hr at 12/16/18 1000  . phenylephrine (NEO-SYNEPHRINE) Adult infusion Stopped (12/15/18 2334)   PRN Meds:.ondansetron (ZOFRAN) IV, oxyCODONE, promethazine, sodium chloride flush, sodium chloride flush, traMADol, white petrolatum  General appearance: alert, cooperative and no distress Neurologic: intact Heart: regular rate and rhythm, S1, S2 normal, no murmur, click, rub or gallop Lungs: diminished breath sounds bibasilar Abdomen: soft, non-tender; bowel sounds normal; no masses,  no organomegaly Extremities: extremities normal, atraumatic, no cyanosis or edema and Homans sign is negative, no sign of DVT Wound: Dressings intact  Lab Results: CBC: Recent Labs    12/15/18 0406 12/16/18 0454  WBC 6.2 5.8  HGB 7.8* 7.9*  HCT 23.6* 24.6*  PLT 72* 60*   BMET:  Recent Labs    12/15/18 0406 12/16/18 0454  NA 139 140  K 4.0 4.0  CL 101 104  CO2 29 27  GLUCOSE 114* 100*  BUN 27* 34*  CREATININE 1.04 0.91  CALCIUM 8.0* 8.0*    CMET: Lab Results  Component Value Date   WBC 5.8 12/16/2018   HGB 7.9 (L) 12/16/2018   HCT 24.6 (L) 12/16/2018   PLT 60 (  L) 12/16/2018   GLUCOSE 100 (H) 12/16/2018   CHOL 182 08/03/2018   TRIG 64 08/03/2018   HDL 53 08/03/2018   LDLCALC 116 (H) 08/03/2018   ALT 16 12/11/2018   AST 19 12/11/2018   NA 140 12/16/2018   K 4.0 12/16/2018   CL 104 12/16/2018   CREATININE 0.91 12/16/2018   BUN 34 (H) 12/16/2018   CO2 27 12/16/2018   PSA 0.90 04/17/2015   INR 1.8 (H) 12/12/2018   HGBA1C 5.0 12/11/2018      PT/INR: No results for input(s): LABPROT, INR in the last 72 hours. Radiology: Dg Chest Port 1 View In Am  Result Date: 12/16/2018 CLINICAL DATA:  Status post CABG. EXAM: PORTABLE CHEST 1 VIEW COMPARISON:  12/15/2018 FINDINGS: 0459 hours. The cardio pericardial silhouette is enlarged. Persistent retrocardiac collapse/consolidation. Bilateral chest tubes remain in place with tiny  right apical pneumothorax, similar to prior. Telemetry leads overlie the chest. IMPRESSION: 1. No substantial interval change in exam. Tiny right apical pneumothorax with bilateral chest tubes in place. 2. Persistent retrocardiac collapse/consolidation. Electronically Signed   By: Misty Stanley M.D.   On: 12/16/2018 09:49     Assessment/Plan: S/P Procedure(s) (LRB): Mediastinal Re-Exploration after bypass graft (N/A) Mobilize Diuresis d/c tubes/lines DC Foley Renal function stable Holding sinus rhythm blood pressure improved, will wean dopamine as tolerated    Frank Love 12/16/2018 10:44 AM

## 2018-12-16 NOTE — Progress Notes (Deleted)
Patient ID: Frank Love, male   DOB: [birth year], 63 y.o.   MRN: [id number] EVENING ROUNDS NOTE :     [address]       [place] [address]             [phone]                 4 Days Post-Op Procedure(s) (LRB): Mediastinal Re-Exploration after bypass graft (N/A)  Total Length of Stay:  LOS: 4 days  BP (!) 102/55   Pulse 64   Temp 98.5 F (36.9 C)   Resp (!) 21   Ht 6\' 3"  (1.905 m)   Wt 109.3 kg   SpO2 100%   BMI 30.11 kg/m   .Intake/Output      11/21 0701 - 11/22 0700 11/22 0701 - 11/23 0700   P.O. 220 480   I.V. (mL/kg) 478.2 (4.4) 189.3 (1.7)   IV Piggyback     Total Intake(mL/kg) 698.2 (6.4) 669.3 (6.1)   Urine (mL/kg/hr) 1235 (0.5) 1810 (1.5)   Stool 0    Chest Tube 400 170   Total Output 1635 1980   Net -936.9 -1310.7        Stool Occurrence 1 x      . DOPamine 3 mcg/kg/min (12/16/18 1800)  . lactated ringers 75 mL/hr at 12/13/18 0750  . lactated ringers 10 mL/hr at 12/16/18 1800  . phenylephrine (NEO-SYNEPHRINE) Adult infusion Stopped (12/15/18 2334)     Lab Results  Component Value Date   WBC 5.8 12/16/2018   HGB 7.9 (L) 12/16/2018   HCT 24.6 (L) 12/16/2018   PLT 60 (L) 12/16/2018   GLUCOSE 100 (H) 12/16/2018   CHOL 182 08/03/2018   TRIG 64 08/03/2018   HDL 53 08/03/2018   LDLCALC 116 (H) 08/03/2018   ALT 16 12/11/2018   AST 19 12/11/2018   NA 140 12/16/2018   K 4.0 12/16/2018   CL 104 12/16/2018   CREATININE 0.91 12/16/2018   BUN 34 (H) 12/16/2018   CO2 27 12/16/2018   PSA 0.90 04/17/2015   INR 1.8 (H) 12/12/2018   HGBA1C 5.0 12/11/2018   Today reanl care has been suboptimal- no nurses to do dislysis so cvvh done only 500 ml off all day Needs dialysis    [name] MD  Beeper [phone] Office [phone] 12/16/2018 6:18 PM

## 2018-12-17 ENCOUNTER — Inpatient Hospital Stay (HOSPITAL_COMMUNITY): Payer: No Typology Code available for payment source

## 2018-12-17 DIAGNOSIS — I361 Nonrheumatic tricuspid (valve) insufficiency: Secondary | ICD-10-CM

## 2018-12-17 LAB — BPAM RBC
Blood Product Expiration Date: 202012132359
Blood Product Expiration Date: 202012152359
Blood Product Expiration Date: 202012152359
Blood Product Expiration Date: 202012152359
Blood Product Expiration Date: 202012152359
Blood Product Expiration Date: 202012162359
Blood Product Expiration Date: 202012162359
Blood Product Expiration Date: 202012162359
Blood Product Expiration Date: 202012232359
Blood Product Expiration Date: 202012232359
ISSUE DATE / TIME: 202011181758
ISSUE DATE / TIME: 202011181758
ISSUE DATE / TIME: 202011181957
ISSUE DATE / TIME: 202011181957
ISSUE DATE / TIME: 202011182033
ISSUE DATE / TIME: 202011190941
ISSUE DATE / TIME: 202011191439
ISSUE DATE / TIME: 202011221310
ISSUE DATE / TIME: 202011222136
Unit Type and Rh: 5100
Unit Type and Rh: 5100
Unit Type and Rh: 6200
Unit Type and Rh: 6200
Unit Type and Rh: 6200
Unit Type and Rh: 6200
Unit Type and Rh: 6200
Unit Type and Rh: 6200
Unit Type and Rh: 6200
Unit Type and Rh: 6200

## 2018-12-17 LAB — TYPE AND SCREEN
ABO/RH(D): A POS
Antibody Screen: NEGATIVE
Unit division: 0
Unit division: 0
Unit division: 0
Unit division: 0
Unit division: 0
Unit division: 0
Unit division: 0
Unit division: 0
Unit division: 0
Unit division: 0

## 2018-12-17 LAB — CBC
HCT: 24.5 % — ABNORMAL LOW (ref 39.0–52.0)
Hemoglobin: 7.9 g/dL — ABNORMAL LOW (ref 13.0–17.0)
MCH: 30.2 pg (ref 26.0–34.0)
MCHC: 32.2 g/dL (ref 30.0–36.0)
MCV: 93.5 fL (ref 80.0–100.0)
Platelets: 122 10*3/uL — ABNORMAL LOW (ref 150–400)
RBC: 2.62 MIL/uL — ABNORMAL LOW (ref 4.22–5.81)
RDW: 13.3 % (ref 11.5–15.5)
WBC: 5.9 10*3/uL (ref 4.0–10.5)
nRBC: 0 % (ref 0.0–0.2)

## 2018-12-17 LAB — BASIC METABOLIC PANEL
Anion gap: 5 (ref 5–15)
BUN: 32 mg/dL — ABNORMAL HIGH (ref 8–23)
CO2: 34 mmol/L — ABNORMAL HIGH (ref 22–32)
Calcium: 7.6 mg/dL — ABNORMAL LOW (ref 8.9–10.3)
Chloride: 102 mmol/L (ref 98–111)
Creatinine, Ser: 0.99 mg/dL (ref 0.61–1.24)
GFR calc Af Amer: >60 mL/min (ref >60)
GFR calc non Af Amer: >60 mL/min (ref >60)
Glucose, Bld: 105 mg/dL — ABNORMAL HIGH (ref 70–99)
Potassium: 3.4 mmol/L — ABNORMAL LOW (ref 3.5–5.1)
Sodium: 141 mmol/L (ref 135–145)

## 2018-12-17 LAB — ECHOCARDIOGRAM LIMITED
Height: 75 in
Weight: 3802.49 oz

## 2018-12-17 LAB — GLUCOSE, CAPILLARY
Glucose-Capillary: 101 mg/dL — ABNORMAL HIGH (ref 70–99)
Glucose-Capillary: 99 mg/dL (ref 70–99)
Glucose-Capillary: 85 mg/dL (ref 70–99)
Glucose-Capillary: 105 mg/dL — ABNORMAL HIGH (ref 70–99)
Glucose-Capillary: 90 mg/dL (ref 70–99)

## 2018-12-17 MED ORDER — AMIODARONE HCL 200 MG PO TABS
200.0000 mg | ORAL_TABLET | Freq: Two times a day (BID) | ORAL | Status: DC
  Administered 2018-12-17 (×2): 200 mg via ORAL
  Filled 2018-12-17 (×2): qty 1

## 2018-12-17 MED ORDER — ALBUMIN HUMAN 25 % IV SOLN
12.5000 g | Freq: Once | INTRAVENOUS | Status: AC
  Administered 2018-12-17: 12.5 g via INTRAVENOUS
  Filled 2018-12-17: qty 50

## 2018-12-17 MED ORDER — MIDODRINE HCL 5 MG PO TABS
5.0000 mg | ORAL_TABLET | Freq: Three times a day (TID) | ORAL | Status: DC
  Administered 2018-12-17 (×2): 5 mg via ORAL
  Filled 2018-12-17 (×2): qty 1

## 2018-12-17 MED ORDER — POTASSIUM CHLORIDE CRYS ER 20 MEQ PO TBCR
20.0000 meq | EXTENDED_RELEASE_TABLET | ORAL | Status: AC
  Administered 2018-12-17 (×2): 20 meq via ORAL
  Filled 2018-12-17 (×3): qty 1

## 2018-12-17 MED ORDER — CHLORHEXIDINE GLUCONATE CLOTH 2 % EX PADS
6.0000 | MEDICATED_PAD | Freq: Every day | CUTANEOUS | Status: DC
  Administered 2018-12-17 – 2018-12-18 (×3): 6 via TOPICAL

## 2018-12-17 MED ORDER — PERFLUTREN LIPID MICROSPHERE
1.0000 mL | INTRAVENOUS | Status: AC | PRN
  Administered 2018-12-17: 4 mL via INTRAVENOUS
  Filled 2018-12-17: qty 10

## 2018-12-17 MED FILL — Potassium Chloride Inj 2 mEq/ML: INTRAVENOUS | Qty: 40 | Status: AC

## 2018-12-17 MED FILL — Cefuroxime Sodium For Inj 750 MG: INTRAMUSCULAR | Qty: 750 | Status: AC

## 2018-12-17 MED FILL — Heparin Sodium (Porcine) Inj 1000 Unit/ML: INTRAMUSCULAR | Qty: 30 | Status: AC

## 2018-12-17 MED FILL — Magnesium Sulfate Inj 50%: INTRAMUSCULAR | Qty: 10 | Status: AC

## 2018-12-17 NOTE — Progress Notes (Signed)
5 Days Post-Op Procedure(s) (LRB): Mediastinal Re-Exploration after bypass graft (N/A) Subjective: No complaints  Objective: Vital signs in last 24 hours: Temp:  [98.1 F (36.7 C)-98.5 F (36.9 C)] 98.2 F (36.8 C) (11/23 0821) Pulse Rate:  [57-72] 62 (11/23 1100) Cardiac Rhythm: Normal sinus rhythm (11/23 1100) Resp:  [13-33] 18 (11/23 1100) BP: (82-108)/(47-63) 98/55 (11/23 1100) SpO2:  [90 %-100 %] 92 % (11/23 1100) Arterial Line BP: (122)/(53) 122/53 (11/22 1200) Weight:  [107.8 kg] 107.8 kg (11/23 0600)  Hemodynamic parameters for last 24 hours:    Intake/Output from previous day: 11/22 0701 - 11/23 0700 In: 981.6 [P.O.:580; I.V.:401.6] Out: 2430 [Urine:2260; Chest Tube:170] Intake/Output this shift: Total I/O In: 32.6 [I.V.:32.6] Out: -   General appearance: alert and cooperative Neurologic: intact Heart: regular rate and rhythm, S1, S2 normal, no murmur, click, rub or gallop Lungs: clear to auscultation bilaterally Abdomen: soft, non-tender; bowel sounds normal; no masses,  no organomegaly Extremities: extremities normal, atraumatic, no cyanosis or edema Wound: c/d/i  Lab Results: Recent Labs    12/16/18 0454 12/17/18 0403  WBC 5.8 5.9  HGB 7.9* 7.9*  HCT 24.6* 24.5*  PLT 60* 122*   BMET:  Recent Labs    12/16/18 0454 12/17/18 0403  NA 140 141  K 4.0 3.4*  CL 104 102  CO2 27 34*  GLUCOSE 100* 105*  BUN 34* 32*  CREATININE 0.91 0.99  CALCIUM 8.0* 7.6*    PT/INR: No results for input(s): LABPROT, INR in the last 72 hours. ABG    Component Value Date/Time   PHART 7.351 12/13/2018 0659   HCO3 22.2 12/13/2018 0659   TCO2 23 12/13/2018 0659   ACIDBASEDEF 3.0 (H) 12/13/2018 0659   O2SAT 98.0 12/13/2018 0659   CBG (last 3)  Recent Labs    12/16/18 1613 12/16/18 2229 12/17/18 0646  GLUCAP 95 108* 99    Assessment/Plan: S/P Procedure(s) (LRB): Mediastinal Re-Exploration after bypass graft (N/A) try to wean DA  Start  midodrine Consider more diuresis later today   LOS: 5 days    [name] 12/17/2018

## 2018-12-17 NOTE — Plan of Care (Signed)
  Problem: Education: Goal: Knowledge of General Education information will improve Description: Including pain rating scale, medication(s)/side effects and non-pharmacologic comfort measures Outcome: Progressing   Problem: Health Behavior/Discharge Planning: Goal: Ability to manage health-related needs will improve Outcome: Progressing   Problem: Clinical Measurements: Goal: Ability to maintain clinical measurements within normal limits will improve Outcome: Progressing Goal: Will remain free from infection Outcome: Progressing Goal: Diagnostic test results will improve Outcome: Progressing Goal: Respiratory complications will improve Outcome: Progressing Goal: Cardiovascular complication will be avoided Outcome: Progressing   Problem: Activity: Goal: Risk for activity intolerance will decrease Outcome: Not Progressing Note: Pt mobility severely limited by pain/weakness in Rt hip and back, needs heavy assistance   Problem: Nutrition: Goal: Adequate nutrition will be maintained Outcome: Progressing   Problem: Coping: Goal: Level of anxiety will decrease Outcome: Progressing   Problem: Elimination: Goal: Will not experience complications related to bowel motility Outcome: Completed/Met Goal: Will not experience complications related to urinary retention Outcome: Completed/Met   Problem: Pain Managment: Goal: General experience of comfort will improve Outcome: Progressing   Problem: Safety: Goal: Ability to remain free from injury will improve Outcome: Progressing   Problem: Skin Integrity: Goal: Risk for impaired skin integrity will decrease Outcome: Progressing   Problem: Education: Goal: Knowledge of disease or condition will improve Outcome: Progressing Goal: Knowledge of the prescribed therapeutic regimen will improve Outcome: Progressing Note: Needs reminders but generally participates in plan of care   Problem: Activity: Goal: Risk for activity  intolerance will decrease Outcome: Not Progressing   Problem: Cardiac: Goal: Will achieve and/or maintain hemodynamic stability Outcome: Progressing   Problem: Clinical Measurements: Goal: Postoperative complications will be avoided or minimized Outcome: Progressing   Problem: Respiratory: Goal: Respiratory status will improve Outcome: Progressing   Problem: Skin Integrity: Goal: Wound healing without signs and symptoms of infection Outcome: Progressing Goal: Risk for impaired skin integrity will decrease Outcome: Progressing    Problem: Urinary Elimination: Goal: Ability to achieve and maintain adequate renal perfusion and functioning will improve Outcome: Progressing

## 2018-12-17 NOTE — Progress Notes (Signed)
Titrated dopamine gtt down to 1.5 per Atkins MD. Midodrine added, carvedilol discontinued, amiodarone dose decreased, and albumin ordered to be given. BP goal MAP greater than 70 per MD.

## 2018-12-17 NOTE — Progress Notes (Signed)
Physical Therapy Treatment Patient Details Name: Frank Love MRN: [id number] DOB: February 02, 1955 Today's Date: 12/17/2018    History of Present Illness Pt adm for CABG x 4 on 11/18. Underwent urgent mediastinal exploration same day and found small bleed from one graft which was repaired with 1 suture. PMH - severe rt hip arthritis (was scheduled for THR), CAD, HTN    PT Comments    Patient progressing well towards PT goals. Tolerated gait training with Min A of 2 for balance/safety with increased effort and difficulty due to right hip DJD and pain through sternum. Fatigues and noted to have weakness in BLEs. VSS throughout on RA. Requires Mod A of 2 to stand from all surfaces. Able to recall sternal precautions. Continue to recommend CIR as pt highly motivated. Will follow.    Follow Up Recommendations  CIR;Supervision/Assistance - 24 hour     Equipment Recommendations  Other (comment)(defer)    Recommendations for Other Services       Precautions / Restrictions Precautions Precautions: Fall;Sternal Precaution Booklet Issued: No Precaution Comments: right hip DJD Restrictions Weight Bearing Restrictions: Yes Other Position/Activity Restrictions: sternal precautions    Mobility  Bed Mobility Overal bed mobility: Needs Assistance Bed Mobility: Sit to Sidelying         Sit to sidelying: Mod assist;+2 for physical assistance;HOB elevated General bed mobility comments: Assist to bring LEs into bed to return to supine. Cues to adhere to sternal precautions  Transfers Overall transfer level: Needs assistance Equipment used: Rolling walker (2 wheeled)(Eva walker) Transfers: Sit to/from Stand Sit to Stand: Mod assist;+2 physical assistance Stand pivot transfers: +2 physical assistance;Mod assist       General transfer comment: Assist of 2 to power to standing with cues for hands on knees and use of momentum; stood from chair x2, from [facility] x1, SPT chair to [facility] and chair  to bed with Mod A of 2 for RW management and balance,  Ambulation/Gait Ambulation/Gait assistance: Min assist;+2 safety/equipment Gait Distance (Feet): 16 Feet Assistive device: [place]on Pier walker) Gait Pattern/deviations: Wide base of support;Step-to pattern;Trunk flexed;Antalgic Gait velocity: decreased   General Gait Details: Slow unsteady and antalgic like gait with wide BoS and flexed trunk. VSS. Limited due to fatigue, right hip paina nd incisional pain.   Stairs             Wheelchair Mobility    Modified Rankin (Stroke Patients Only)       Balance Overall balance assessment: Needs assistance Sitting-balance support: Feet supported;No upper extremity supported Sitting balance-Leahy Scale: Fair Sitting balance - Comments: UE support and min guard for static sitting EOB.   Standing balance support: During functional activity Standing balance-Leahy Scale: Poor Standing balance comment: Requires UE support in standing; able to perform pericare with Min A for balance.                            Cognition Arousal/Alertness: Awake/alert Behavior During Therapy: WFL for tasks assessed/performed Overall Cognitive Status: Within Functional Limits for tasks assessed                                        Exercises      General Comments General comments (skin integrity, edema, etc.): BP pre activity 82/51, post activity BP 96/55. Other VSS, left on RA during session and Sp02 in 90s.  Pertinent Vitals/Pain Pain Assessment: 0-10 Pain Score: 8  Pain Location: chest, sternal incision and RLE with WB Pain Descriptors / Indicators: Aching;Sore Pain Intervention(s): Repositioned;Monitored during session    Home Living                      Prior Function            PT Goals (current goals can now be found in the care plan section) Progress towards PT goals: Progressing toward goals    Frequency    Min 3X/week      PT  Plan Current plan remains appropriate    Co-evaluation              AM-PAC PT "6 Clicks" Mobility   Outcome Measure  Help needed turning from your back to your side while in a flat bed without using bedrails?: A Lot Help needed moving from lying on your back to sitting on the side of a flat bed without using bedrails?: A Lot Help needed moving to and from a bed to a chair (including a wheelchair)?: A Lot Help needed standing up from a chair using your arms (e.g., wheelchair or bedside chair)?: A Lot Help needed to walk in hospital room?: A Little Help needed climbing 3-5 steps with a railing? : Total 6 Click Score: 12    End of Session Equipment Utilized During Treatment: Gait belt Activity Tolerance: Patient tolerated treatment well Patient left: in bed;with call bell/phone within reach Nurse Communication: Mobility status PT Visit Diagnosis: Other abnormalities of gait and mobility (R26.89);Muscle weakness (generalized) (M62.81);Difficulty in walking, not elsewhere classified (R26.2);Pain Pain - Right/Left: Right Pain - part of body: Hip(chest)     Time: [id number] PT Time Calculation (min) (ACUTE ONLY): 30 min  Charges:  $Gait Training: 8-22 mins $Therapeutic Activity: 8-22 mins                     Marisa Severin, PT, DPT Acute Rehabilitation Services Pager [phone] Office [place] 12/17/2018, 10:47 AM

## 2018-12-17 NOTE — Progress Notes (Signed)
Atkins MD notified of continued MAPs low to mid 60s with dopamine titrated down. Ordered to continue to monitor and not to transfer patient.

## 2018-12-17 NOTE — Progress Notes (Signed)
      [address]       [place] [address]             [phone]      POD # 5   BP (!) 98/58 (BP Location: Right Arm)   Pulse 67   Temp 98.7 F (37.1 C) (Oral)   Resp 18   Ht 6\' 3"  (1.905 m)   Wt 107.8 kg   SpO2 92%   BMI 29.70 kg/m   dopamine at 1.5 mcg/kg/min   Intake/Output Summary (Last 24 hours) at 12/17/2018 1739 Last data filed at 12/17/2018 1600 Gross per 24 hour  Intake 393.63 ml  Output 600 ml  Net -206.37 ml   Stable day  Remo Lipps C. Roxan Hockey, MD [facility] [phone]

## 2018-12-17 NOTE — Progress Notes (Signed)
  Echocardiogram 2D Echocardiogram limited with definity has been performed.  Darlina Sicilian M 12/17/2018, 4:23 PM

## 2018-12-17 NOTE — Plan of Care (Signed)
  Problem: Education: Goal: Knowledge of General Education information will improve Description: Including pain rating scale, medication(s)/side effects and non-pharmacologic comfort measures Outcome: Progressing   Problem: Health Behavior/Discharge Planning: Goal: Ability to manage health-related needs will improve Outcome: Progressing   Problem: Clinical Measurements: Goal: Ability to maintain clinical measurements within normal limits will improve Outcome: Progressing Goal: Will remain free from infection Outcome: Progressing Goal: Diagnostic test results will improve Outcome: Progressing Goal: Respiratory complications will improve Outcome: Progressing Goal: Cardiovascular complication will be avoided Outcome: Progressing   Problem: Nutrition: Goal: Adequate nutrition will be maintained Outcome: Progressing   Problem: Coping: Goal: Level of anxiety will decrease Outcome: Progressing   Problem: Pain Managment: Goal: General experience of comfort will improve Outcome: Progressing   Problem: Safety: Goal: Ability to remain free from injury will improve Outcome: Progressing   Problem: Skin Integrity: Goal: Risk for impaired skin integrity will decrease Outcome: Progressing   Problem: Education: Goal: Will demonstrate proper wound care and an understanding of methods to prevent future damage Outcome: Progressing Goal: Knowledge of disease or condition will improve Outcome: Progressing Goal: Knowledge of the prescribed therapeutic regimen will improve Outcome: Progressing Goal: Individualized Educational Video(s) Outcome: Progressing   Problem: Activity: Goal: Risk for activity intolerance will decrease Outcome: Progressing   Problem: Cardiac: Goal: Will achieve and/or maintain hemodynamic stability Outcome: Progressing   Problem: Clinical Measurements: Goal: Postoperative complications will be avoided or minimized Outcome: Progressing   Problem:  Respiratory: Goal: Respiratory status will improve Outcome: Progressing   Problem: Skin Integrity: Goal: Wound healing without signs and symptoms of infection Outcome: Progressing Goal: Risk for impaired skin integrity will decrease Outcome: Progressing   Problem: Urinary Elimination: Goal: Ability to achieve and maintain adequate renal perfusion and functioning will improve Outcome: Progressing

## 2018-12-17 NOTE — PMR Pre-admission (Signed)
PMR Admission Coordinator Pre-Admission Assessment  Patient: Frank Love is an 63 y.o., male MRN: [id number] DOB: [birth year] Height: 6' 3"  (190.5 cm) Weight: 105.2 kg  Insurance Information HMO:     PPO:      PCP:      IPA:     80/20:      OTHER:  PRIMARY: Pt is uninsured (self pay)      Policy#:       Subscriber:  CM Name:       Phone#:      Fax#:  Pre-Cert#:      Employer:  Benefits:  Phone #:      Name:  Eff. Date:      Deduct:       Out of Pocket Max:      Life Max:  CIR: Pt is aware of estimated daily cost of care for IP Rehab ($3,500). [Per Financial Counselor, Toniann Ket, this patient has been approved for financial assistance of 75% off his [facility] bills; Per Jenny Reichmann this would include CIR] SNF: Outpatient:      Co-Pay:  Home Health:       Co-Pay:  DME:      Co-Pay:  Providers:   SECONDARY:      Policy#:       Subscriber:  CM Name:       Phone#:      Fax#: Pre-Cert#:       Employer:  Benefits:  Phone #:      Name:  Eff. Date:      Deduct:       Out of Pocket Max      Life Max:  CIR:       SNF:  Outpatient:      Co-Pay:  Home Health:       Co-Pay:  DME:     Co-Pay:   Medicaid Application Date: Retro/ongoing MAD app pending. Date of app 81/08/2991  Disability Application Date:       Case Worker: N. Whitsett ([email or web address])  The "Data Collection Information Summary" for patients in Inpatient Rehabilitation Facilities with attached "Privacy Act [place] Records" was provided and verbally reviewed with: N/A  Emergency Contact Information Contact Information    Name Relation Home Work Frank Love Spouse   [place] Daughter   [phone]      Current Medical History  Patient Admitting Diagnosis: Mediastinal re-exploration after bypass graft  History of Present Illness: Frank Love is a 63 year old male with history of HTN, CAD with systolic CHF who was admitted on 12/12/18 for CABG X 4  by Dr.Adkins. Post op had excessive output from chest tube requiring FFP, cryoprecipitate and 2 units PRBC. He was taken back to OR later that evening for mediastinal reexploration. He has had issues with fluid overload requiring diuresis with dopamine as well as neosynephrine due to hypotension. Midodrine added for BP support and dopamine discontinued 11/24. Follow up echo 11/23 showed EF 30-35% with global hypokinesis and LV thrombus excluded with contrast. Pt has progressed to [facility] since surgery and, per cardiology, may eventually need RHC. IV lasix was switched to PO. Pt has been evaluated by therapies with recommendation for CIR. Pt is to admit to CIR on 12/21/2018.     Patient's medical record from [facility] has been reviewed by the rehabilitation admission coordinator and physician.  Past Medical History  Past Medical History:  Diagnosis Date  . Chronic pain of right knee   .  Coronary artery disease   . Hypertension   . Myocardial infarction [facility])    possible in the past was told by a doctor but no one has ever said anything else  . Phimosis     Family History   family history includes Diabetes in his mother; Heart disease in his mother.  Prior Rehab/Hospitalizations Has the patient had prior rehab or hospitalizations prior to admission? Yes  Has the patient had major surgery during 100 days prior to admission? Yes   Current Medications  Current Facility-Administered Medications:  .  aspirin EC tablet 81 mg, 81 mg, Oral, Daily, Atkins, Glenice Bow, MD, 81 mg at 12/21/18 1048 .  atorvastatin (LIPITOR) tablet 40 mg, 40 mg, Oral, q1800, Nani Skillern, PA-C, 40 mg at 12/20/18 1658 .  bisacodyl (DULCOLAX) EC tablet 10 mg, 10 mg, Oral, Daily, 10 mg at 12/20/18 0848 **OR** bisacodyl (DULCOLAX) suppository 10 mg, 10 mg, Rectal, Daily, Atkins, Broadus Z, MD .  carvedilol (COREG) tablet 3.125 mg, 3.125 mg, Oral, BID WC, Atkins, Glenice Bow, MD, 3.125 mg at 12/21/18  0821 .  Chlorhexidine Gluconate Cloth 2 % PADS 6 each, 6 each, Topical, Daily, [name], MD, 6 each at 12/18/18 2023 .  clopidogrel (PLAVIX) tablet 75 mg, 75 mg, Oral, Daily, Atkins, Glenice Bow, MD, 75 mg at 12/21/18 1047 .  colchicine tablet 0.6 mg, 0.6 mg, Oral, Daily, Atkins, Glenice Bow, MD, 0.6 mg at 12/21/18 1047 .  digoxin (LANOXIN) tablet 0.125 mg, 0.125 mg, Oral, Daily, Clegg, Amy D, NP, 0.125 mg at 12/21/18 1048 .  docusate sodium (COLACE) capsule 200 mg, 200 mg, Oral, Daily, Orvan Seen, Glenice Bow, MD, 200 mg at 12/19/18 0835 .  ferrous [id number] C-folic acid (TRINSICON / FOLTRIN) capsule 1 capsule, 1 capsule, Oral, BID PC, Atkins, Glenice Bow, MD, 1 capsule at 12/21/18 0821 .  furosemide (LASIX) tablet 40 mg, 40 mg, Oral, Daily, Clegg, Amy D, NP, 40 mg at 12/21/18 1050 .  multivitamins with iron tablet 1 tablet, 1 tablet, Oral, Daily, Orvan Seen, Glenice Bow, MD, 1 tablet at 12/21/18 1048 .  ondansetron (ZOFRAN) injection 4 mg, 4 mg, Intravenous, Q6H PRN, Orvan Seen, Glenice Bow, MD, 4 mg at 12/18/18 1344 .  oxyCODONE (Oxy IR/ROXICODONE) immediate release tablet 5 mg, 5 mg, Oral, Q4H PRN, [name], MD, 5 mg at 12/19/18 0417 .  pantoprazole (PROTONIX) EC tablet 20 mg, 20 mg, Oral, Daily, Lars Pinks M, PA-C, 20 mg at 12/21/18 1047 .  potassium chloride SA (KLOR-CON) CR tablet 20 mEq, 20 mEq, Oral, Daily, Lars Pinks M, PA-C, 20 mEq at 12/21/18 1051 .  promethazine (PHENERGAN) injection 6.25 mg, 6.25 mg, Intravenous, Q6H PRN, [name], MD, 6.25 mg at 12/18/18 1553 .  sodium chloride flush (NS) 0.9 % injection 3 mL, 3 mL, Intravenous, PRN, Atkins, Broadus Z, MD .  spironolactone (ALDACTONE) tablet 25 mg, 25 mg, Oral, Daily, Larey Dresser, MD, 25 mg at 12/21/18 1047 .  traMADol (ULTRAM) tablet 50 mg, 50 mg, Oral, Q4H PRN, [name], MD, 50 mg at 12/17/18 1506 .  white petrolatum (VASELINE) gel, , Topical, PRN, Orvan Seen, Glenice Bow, MD  Patients  Current Diet:  Diet Order            Diet heart healthy/carb modified Room service appropriate? Yes; Fluid consistency: Thin  Diet effective now              Precautions / Restrictions Precautions Precautions: Fall, Sternal Precaution Booklet Issued: Yes (comment) Precaution  Comments: right hip DJD Restrictions Weight Bearing Restrictions: Yes RLE Weight Bearing: Weight bearing as tolerated Other Position/Activity Restrictions: sternal precautions    Has the patient had 2 or more falls or a fall with injury in the past year? 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 Pt Independent with use of straight cane (does have Right hip pain at baseline)  Prior Functional Level Self Care: Did the patient need help bathing, dressing, using the toilet or eating? Independent  Indoor Mobility: Did the patient need assistance with walking from room to room (with or without device)? Independent  Stairs: Did the patient need assistance with internal or external stairs (with or without device)? Independent  Functional Cognition: Did the patient need help planning regular tasks such as shopping or remembering to take medications? [place] Devices/Equipment: Cane (specify quad or straight), Eyeglasses Home Equipment: Walker - 2 wheels, Cane - single point  Prior Device Use: Indicate devices/aids used by the patient prior to current illness, exacerbation or injury? single point cane  Current Functional Level Cognition  Overall Cognitive Status: Within Functional Limits for tasks assessed Orientation Level: Oriented X4    Extremity Assessment (includes Sensation/Coordination)  Upper Extremity Assessment: Generalized weakness  Lower Extremity Assessment: Generalized weakness, RLE deficits/detail RLE Deficits / Details: Limited strength and ROM due to rt hip pain     ADLs  Overall ADL's : Needs assistance/impaired Eating/Feeding: Set up, Sitting Grooming: Set up, Sitting, Oral care Grooming Details (indicate cue type and reason): seated in recliner  Upper Body Bathing: Moderate assistance, Sitting Lower Body Bathing: Total assistance Lower Body Bathing Details (indicate cue type and reason): Mod A +2 sit<>stand from recliner with momentum Upper Body Dressing : Maximal assistance, Sitting Lower Body Dressing: Total assistance, Sit to/from stand Lower Body Dressing Details (indicate cue type and reason): requires assist to adjust socks, mod-max assist sit to stand  Toilet Transfer: Moderate assistance, Maximal assistance, Stand-pivot, RW Toilet Transfer Details (indicate cue type and reason): simulated to recliner Toileting- Clothing Manipulation and Hygiene: Total assistance Toileting - Clothing Manipulation Details (indicate cue type and reason): Mod A +2 sit<>stand from recliner with momentum Functional mobility during ADLs: Moderate assistance, Maximal assistance, Rolling walker, Cueing for safety, Cueing for sequencing General ADL Comments: provided handout for sternal precautions for pt to review, verbally educated precautions but required cueing to adhere functioanlly    Mobility  Overal bed mobility: Needs Assistance Bed Mobility: Supine to Sit Supine to sit: Mod assist, HOB elevated Sit to sidelying: Mod assist, HOB elevated General bed mobility comments: pt able to bring legs off bed, mod assist to ascend trunk; increased time and effort with cueing for technique    Transfers  Overall transfer level: Needs assistance Equipment used: Rolling walker (2 wheeled) Transfers: Sit to/from Stand Sit to Stand: Mod assist, Max assist, From elevated surface Stand pivot transfers: Min assist General transfer comment: from elevated EOB mod assist, from recliner max assist using momentum holding onto heart pillow to ascend into standing, with support  to power up and steady; small shuffling steps to recliner givne min assist    Ambulation / Gait / Stairs / Wheelchair Mobility  Ambulation/Gait Ambulation/Gait assistance: Min assist, +2 safety/equipment Gait Distance (Feet): 26 Feet(+30') Assistive device: Rolling walker (2 wheeled) Gait Pattern/deviations: Wide base of support, Step-to pattern, Trunk flexed, Antalgic, Shuffle General Gait Details: Slow, unsteady and antalgic like gait with short step  lengths bilaterally. Wide BoS. 1 seated rest break. VSS. Gait velocity: decreased    Posture / Balance Dynamic Sitting Balance Sitting balance - Comments: UE support and min guard for static sitting EOB. Balance Overall balance assessment: Needs assistance Sitting-balance support: Feet supported, No upper extremity supported Sitting balance-Leahy Scale: Fair Sitting balance - Comments: UE support and min guard for static sitting EOB. Standing balance support: Bilateral upper extremity supported, During functional activity Standing balance-Leahy Scale: Poor Standing balance comment: Requires UE support in standing    Special needs/care consideration BiPAP/CPAP : no CPM : no Continuous Drip IV : no Dialysis : no        Days : no Life Vest : no Oxygen : no Special Bed : no Trach Size : no Wound Vac (area) : no      Location : no Skin: right and left lip ecchymosis, chest incision, right leg incision                    Bowel mgmt: last BM: 12/20/2018; continent Bladder mgmt: continent  Diabetic mgmt: no Behavioral consideration : no Chemo/radiation : no   Previous Home Environment (from acute therapy documentation) Living Arrangements: Spouse/significant other Available Help at Discharge: Family, Available 24 hours/day Type of Home: Apartment Home Layout: One level Home Access: Stairs to enter Entrance Stairs-Rails: Right Entrance Stairs-Number of Steps: 3+5 Bathroom Shower/Tub: Chiropodist: Standard Home  Care Services: No Additional Comments: Pt sleeps in armchair with feet on ottoman  Discharge Living Setting Plans for Discharge Living Setting: Patient's home, Lives with (comment)(wife) Type of Home at Discharge: Apartment Discharge Home Layout: One level Discharge Home Access: Stairs to enter Entrance Stairs-Rails: Right Entrance Stairs-Number of Steps: 1 (no rails), then 4 then 5 Discharge Bathroom Shower/Tub: Tub/shower unit Discharge Bathroom Toilet: Standard Discharge Bathroom Accessibility: Yes How Accessible: Accessible via walker Does the patient have any problems obtaining your medications?: No  Social/Family/Support Systems Patient Roles: Spouse Contact Information: [name] (wife): [phone] Anticipated Caregiver: wife Anticipated Caregiver's Contact Information: see above Ability/Limitations of Caregiver: supervision Caregiver Availability: 24/7 Discharge Plan Discussed with Primary Caregiver: Yes Is Caregiver In Agreement with Plan?: Yes Does Caregiver/Family have Issues with Lodging/Transportation while Pt is in Rehab?: No  Goals/Additional Needs Patient/Family Goal for Rehab: PT/OT: Supervision; SLP: NA Expected length of stay: 7-10 days Cultural Considerations: NA Dietary Needs: heart healthy/carb modified; thin liquids  Equipment Needs: TBD Pt/Family Agrees to Admission and willing to participate: Yes Program Orientation Provided & Reviewed with Pt/Caregiver Including Roles  & Responsibilities: Yes(pt and wife)  Barriers to Discharge: Home environment access/layout, Lack of/limited family support, Insurance for SNF coverage  Barriers to Discharge Comments: steps to enter; wife can only do supervision/light min A. uninsured.   Decrease burden of Care through IP rehab admission: NA  Possible need for SNF placement upon discharge: Not anticipated; pt is uninsured. Pt will have 24/7 Supervision at DC. Anticipate pt can get to supervision level after CIR stay. Pt  and family aware plan is to DC home after CIR.   Patient Condition: I have reviewed medical records from [facility], spoken with PA, and patient and spouse. I met with patient at the bedside for inpatient rehabilitation assessment.  Patient will benefit from ongoing PT and OT, can actively participate in 3 hours of therapy a day 5 days of the week, and can make measurable gains during the admission.  Patient will also benefit from the coordinated team approach during an  Inpatient Acute Rehabilitation admission.  The patient will receive intensive therapy as well as Rehabilitation physician, nursing, social worker, and care management interventions.  Due to safety, skin/wound care, disease management, medication administration, pain management and patient education the patient requires 24 hour a day rehabilitation nursing.  The patient is currently Min A + 2 with mobility and set up A to total A for basic ADLs.  Discharge setting and therapy post discharge at home with home health is anticipated.  Patient has agreed to participate in the Acute Inpatient Rehabilitation Program and will admit 12/21/2018.  Preadmission Screen Completed By:  Raechel Ache, 12/21/2018 10:55 AM ______________________________________________________________________   Discussed status with Dr. Naaman Plummer at 10:55AM on 12/21/2018 and received approval for admission today.  Admission Coordinator:  Raechel Ache, OT, time 10:55AM/Date 12/21/2018   Assessment/Plan: Diagnosis: Impaired mobility and ADLs following CABG x4.  1. Does the need for close, 24 hr/day Medical supervision in concert with the patient's rehab needs make it unreasonable for this patient to be served in a less intensive setting? Yes 2. Co-Morbidities requiring supervision/potential complications: anemia, HTN, chronic right knee pain, phimosis 3. Due to bowel management, safety, skin/wound care, disease management, medication administration, pain  management and patient education, does the patient require 24 hr/day rehab nursing? Yes 4. Does the patient require coordinated care of a physician, rehab nurse, PT, OT, and SLP to address physical and functional deficits in the context of the above medical diagnosis(es)? Yes Addressing deficits in the following areas: balance, endurance, locomotion, strength, transferring, bowel/bladder control, bathing, dressing, feeding, grooming, toileting and psychosocial support 5. Can the patient actively participate in an intensive therapy program of at least 3 hrs of therapy 5 days a week? Yes 6. The potential for patient to make measurable gains while on inpatient rehab is excellent 7. Anticipated functional outcomes upon discharge from inpatient rehab: modified independent PT, modified independent OT, independent SLP 8. Estimated rehab length of stay to reach the above functional goals is: 7-11 days 9. Anticipated discharge destination: Home 10. Overall Rehab/Functional Prognosis: excellent   MD Signature: Meredith Staggers, MD, [facility] 12/21/2018

## 2018-12-17 NOTE — Progress Notes (Signed)
Inpatient Rehabilitation-Admissions Coordinator   Followed up with pt at the bedside for continued discussion regarding CIR and post acute rehab. Pt remains very interested in the program and would like to pursue once medically ready. I have confirmed DC support from his wife.   Will follow for medical readiness.   Jhonnie Garner, OTR/L  Rehab Admissions Coordinator  [phone] 12/17/2018 12:17 PM

## 2018-12-18 ENCOUNTER — Inpatient Hospital Stay (HOSPITAL_COMMUNITY): Payer: No Typology Code available for payment source

## 2018-12-18 LAB — BASIC METABOLIC PANEL
Anion gap: 3 — ABNORMAL LOW (ref 5–15)
BUN: 28 mg/dL — ABNORMAL HIGH (ref 8–23)
CO2: 32 mmol/L (ref 22–32)
Calcium: 8 mg/dL — ABNORMAL LOW (ref 8.9–10.3)
Chloride: 105 mmol/L (ref 98–111)
Creatinine, Ser: 0.92 mg/dL (ref 0.61–1.24)
GFR calc Af Amer: >60 mL/min (ref >60)
GFR calc non Af Amer: >60 mL/min (ref >60)
Glucose, Bld: 214 mg/dL — ABNORMAL HIGH (ref 70–99)
Potassium: 3.5 mmol/L (ref 3.5–5.1)
Sodium: 140 mmol/L (ref 135–145)

## 2018-12-18 LAB — GLUCOSE, CAPILLARY
Glucose-Capillary: 100 mg/dL — ABNORMAL HIGH (ref 70–99)
Glucose-Capillary: 88 mg/dL (ref 70–99)
Glucose-Capillary: 93 mg/dL (ref 70–99)
Glucose-Capillary: 85 mg/dL (ref 70–99)

## 2018-12-18 MED ORDER — PROMETHAZINE HCL 25 MG/ML IJ SOLN
6.2500 mg | Freq: Four times a day (QID) | INTRAMUSCULAR | Status: DC | PRN
  Administered 2018-12-18: 6.25 mg via INTRAVENOUS
  Filled 2018-12-18: qty 1

## 2018-12-18 MED ORDER — CARVEDILOL 3.125 MG PO TABS
3.1250 mg | ORAL_TABLET | Freq: Two times a day (BID) | ORAL | Status: DC
  Administered 2018-12-19 – 2018-12-21 (×4): 3.125 mg via ORAL
  Filled 2018-12-18 (×5): qty 1

## 2018-12-18 MED ORDER — TAB-A-VITE/IRON PO TABS
1.0000 | ORAL_TABLET | Freq: Every day | ORAL | Status: DC
  Administered 2018-12-18 – 2018-12-21 (×4): 1 via ORAL
  Filled 2018-12-18 (×4): qty 1

## 2018-12-18 MED ORDER — MIDODRINE HCL 5 MG PO TABS
5.0000 mg | ORAL_TABLET | Freq: Three times a day (TID) | ORAL | Status: DC
  Administered 2018-12-19 (×2): 5 mg via ORAL
  Filled 2018-12-18 (×3): qty 1

## 2018-12-18 MED ORDER — CHLORPROMAZINE HCL 10 MG PO TABS
10.0000 mg | ORAL_TABLET | Freq: Once | ORAL | Status: AC
  Administered 2018-12-18: 10 mg via ORAL
  Filled 2018-12-18: qty 1

## 2018-12-18 MED ORDER — FUROSEMIDE 10 MG/ML IJ SOLN
40.0000 mg | Freq: Once | INTRAMUSCULAR | Status: AC
  Administered 2018-12-18: 40 mg via INTRAVENOUS
  Filled 2018-12-18: qty 4

## 2018-12-18 MED ORDER — POTASSIUM CHLORIDE 10 MEQ/50ML IV SOLN
10.0000 meq | Freq: Once | INTRAVENOUS | Status: AC
  Administered 2018-12-18: 10 meq via INTRAVENOUS
  Filled 2018-12-18: qty 50

## 2018-12-18 MED ORDER — POTASSIUM CHLORIDE 10 MEQ/50ML IV SOLN
10.0000 meq | INTRAVENOUS | Status: AC
  Administered 2018-12-18 (×2): 10 meq via INTRAVENOUS
  Filled 2018-12-18 (×2): qty 50

## 2018-12-18 MED ORDER — MIDODRINE HCL 5 MG PO TABS
10.0000 mg | ORAL_TABLET | Freq: Three times a day (TID) | ORAL | Status: DC
  Administered 2018-12-18 (×3): 10 mg via ORAL
  Filled 2018-12-18 (×3): qty 2

## 2018-12-18 MED ORDER — FE FUMARATE-B12-VIT C-FA-IFC PO CAPS
1.0000 | ORAL_CAPSULE | Freq: Two times a day (BID) | ORAL | Status: DC
  Administered 2018-12-18 – 2018-12-21 (×7): 1 via ORAL
  Filled 2018-12-18 (×7): qty 1

## 2018-12-18 NOTE — Progress Notes (Signed)
Patient ID: KHAMARION LEAK, male   DOB: 1955/07/15, 63 y.o.   MRN: [id number] EVENING ROUNDS NOTE :     [address]       [place] [address]             [phone]                 6 Days Post-Op Procedure(s) (LRB): Mediastinal Re-Exploration after bypass graft (N/A)  Total Length of Stay:  LOS: 6 days  BP 107/61   Pulse 64   Temp 98.3 F (36.8 C) (Oral)   Resp 20   Ht 6\' 3"  (1.905 m)   Wt 105.4 kg   SpO2 98%   BMI 29.04 kg/m   .Intake/Output      11/24 0701 - 11/25 0700   P.O. 360   I.V. (mL/kg) 8.5 (0.1)   Total Intake(mL/kg) 368.5 (3.5)   Urine (mL/kg/hr) 1650 (1.3)   Stool 0   Total Output 1650   Net -1281.5       Urine Occurrence 1 x   Stool Occurrence 2 x     . potassium chloride 10 mEq (12/18/18 1830)     Lab Results  Component Value Date   WBC 5.9 12/17/2018   HGB 7.9 (L) 12/17/2018   HCT 24.5 (L) 12/17/2018   PLT 122 (L) 12/17/2018   GLUCOSE 214 (H) 12/18/2018   CHOL 182 08/03/2018   TRIG 64 08/03/2018   HDL 53 08/03/2018   LDLCALC 116 (H) 08/03/2018   ALT 16 12/11/2018   AST 19 12/11/2018   NA 140 12/18/2018   K 3.5 12/18/2018   CL 105 12/18/2018   CREATININE 0.92 12/18/2018   BUN 28 (H) 12/18/2018   CO2 32 12/18/2018   PSA 0.90 04/17/2015   INR 1.8 (H) 12/12/2018   HGBA1C 5.0 12/11/2018   Waiting for transfer   [name] MD  Beeper [id number] Office [phone] 12/18/2018 7:17 PM

## 2018-12-18 NOTE — Progress Notes (Signed)
CARDIAC REHAB PHASE I   PRE:  Rate/Rhythm: 68 SR    BP: sitting 101/54    SaO2: 97 RA  MODE:  Ambulation: 92 ft (46 ft x2)   POST:  Rate/Rhythm: 90 SR    BP: sitting 111/57     SaO2: 95 RA  Pt willing to walk. Mod assist x2 with gait belt to stand. Assist x2 with gait belt and rollator first 46 ft. Pt with difficulty moving right leg/hip. Has it protrude to right outside of rollator leg. Attempts to stand tall but does bend over rollator. Pt sat to rest on rollator seat (difficulty turning, max assist x2). Pt walked back to room with RW as he liked it better than rollator. To recliner.  [place], ACSM 12/18/2018 2:27 PM

## 2018-12-18 NOTE — Progress Notes (Signed)
Physical Therapy Treatment Patient Details Name: Frank Love MRN: [id number] DOB: [birth year] Today's Date: 12/18/2018    History of Present Illness Pt adm for CABG x 4 on 11/18. Underwent urgent mediastinal exploration same day and found small bleed from one graft which was repaired with 1 suture. PMH - severe rt hip arthritis (was scheduled for THR), CAD, HTN    PT Comments    Patient progressing well towards PT goals. Improved ambulation distance today with Min A for balance/safety. Requires 1 seated rest break due to fatigue. Requires Mod A for standing transfers. Does a good job of adhering to sternal precautions and using heart pillow. Would be a great CIR candidate. VSS throughout. Will follow.    Follow Up Recommendations  CIR;Supervision/Assistance - 24 hour     Equipment Recommendations  Other (comment)(defer)    Recommendations for Other Services       Precautions / Restrictions Precautions Precautions: Fall;Sternal Precaution Booklet Issued: No Precaution Comments: right hip DJD Restrictions Weight Bearing Restrictions: Yes Other Position/Activity Restrictions: sternal precautions    Mobility  Bed Mobility Overal bed mobility: Needs Assistance           Sit to sidelying: Mod assist;HOB elevated General bed mobility comments: Assist to bring LEs into bed to return to supine.  Transfers Overall transfer level: Needs assistance Equipment used: Rolling walker (2 wheeled) Transfers: Sit to/from Stand Sit to Stand: Mod assist;+2 physical assistance Stand pivot transfers: Min assist       General transfer comment: Assist of 2 to power to standing with cues for hands on knees and use of momentum; stood from chair x2, from [facility] x1, SPT chair to bed with Min A  Ambulation/Gait Ambulation/Gait assistance: Min assist;+2 safety/equipment Gait Distance (Feet): 26 Feet(+30') Assistive device: Rolling walker (2 wheeled) Gait Pattern/deviations: Wide base of  support;Step-to pattern;Trunk flexed;Antalgic;Shuffle Gait velocity: decreased   General Gait Details: Slow, unsteady and antalgic like gait with short step lengths bilaterally. Wide BoS. 1 seated rest break. VSS.   Stairs             Wheelchair Mobility    Modified Rankin (Stroke Patients Only)       Balance Overall balance assessment: Needs assistance Sitting-balance support: Feet supported;No upper extremity supported Sitting balance-Leahy Scale: Fair Sitting balance - Comments: UE support and min guard for static sitting EOB.   Standing balance support: During functional activity Standing balance-Leahy Scale: Poor Standing balance comment: Requires UE support in standing                            Cognition Arousal/Alertness: Awake/alert Behavior During Therapy: WFL for tasks assessed/performed Overall Cognitive Status: Within Functional Limits for tasks assessed                                        Exercises      General Comments General comments (skin integrity, edema, etc.): VSS throughout.      Pertinent Vitals/Pain Pain Assessment: 0-10 Pain Score: 1  Pain Location: sternum Pain Descriptors / Indicators: Sore Pain Intervention(s): Monitored during session    Home Living                      Prior Function            PT Goals (current goals can now be found  in the care plan section) Progress towards PT goals: Progressing toward goals    Frequency    Min 3X/week      PT Plan Current plan remains appropriate    Co-evaluation              AM-PAC PT "6 Clicks" Mobility   Outcome Measure  Help needed turning from your back to your side while in a flat bed without using bedrails?: A Little Help needed moving from lying on your back to sitting on the side of a flat bed without using bedrails?: A Lot Help needed moving to and from a bed to a chair (including a wheelchair)?: A Lot Help needed  standing up from a chair using your arms (e.g., wheelchair or bedside chair)?: A Lot Help needed to walk in hospital room?: A Little Help needed climbing 3-5 steps with a railing? : Total 6 Click Score: 13    End of Session Equipment Utilized During Treatment: Gait belt Activity Tolerance: Patient tolerated treatment well Patient left: in bed;with call bell/phone within reach Nurse Communication: Mobility status PT Visit Diagnosis: Other abnormalities of gait and mobility (R26.89);Muscle weakness (generalized) (M62.81);Difficulty in walking, not elsewhere classified (R26.2);Pain Pain - part of body: (sternum)     Time: [id number] PT Time Calculation (min) (ACUTE ONLY): 24 min  Charges:  $Gait Training: 8-22 mins $Therapeutic Activity: 8-22 mins                     Marisa Severin, PT, DPT Acute Rehabilitation Services Pager [phone] Office [place] 12/18/2018, 9:45 AM

## 2018-12-18 NOTE — Progress Notes (Signed)
Inpatient Rehabilitation-Admissions Coordinator   Met with pt bedside this AM. He is still interested in CIR. Noted pt now off Dopamine infusion. Anticipate possible admission to IP Rehab tomorrow, pending medical stability.   Jhonnie Garner, OTR/L  Rehab Admissions Coordinator  [phone] 12/18/2018 12:02 PM

## 2018-12-18 NOTE — Progress Notes (Signed)
6 Days Post-Op Procedure(s) (LRB): Mediastinal Re-Exploration after bypass graft (N/A) Subjective: Mild nausea Objective: Vital signs in last 24 hours: Temp:  [97.9 F (36.6 C)-98.7 F (37.1 C)] 98.2 F (36.8 C) (11/24 0500) Pulse Rate:  [58-72] 63 (11/24 0700) Cardiac Rhythm: Normal sinus rhythm (11/24 0330) Resp:  [10-33] 22 (11/24 0700) BP: (82-118)/(49-72) 100/63 (11/24 0700) SpO2:  [88 %-100 %] 98 % (11/24 0700) Weight:  [105.4 kg] 105.4 kg (11/24 0500)  Hemodynamic parameters for last 24 hours:    Intake/Output from previous day: 11/23 0701 - 11/24 0700 In: 93.6 [I.V.:93.6] Out: 600 [Urine:600] Intake/Output this shift: No intake/output data recorded.  General appearance: alert and cooperative Neurologic: intact Heart: regular rate and rhythm, S1, S2 normal, no murmur, click, rub or gallop Lungs: clear to auscultation bilaterally Abdomen: soft, non-tender; bowel sounds normal; no masses,  no organomegaly Extremities: edema minimal Wound: c/d/i neuro intact  Lab Results: Recent Labs    12/16/18 0454 12/17/18 0403  WBC 5.8 5.9  HGB 7.9* 7.9*  HCT 24.6* 24.5*  PLT 60* 122*   BMET:  Recent Labs    12/16/18 0454 12/17/18 0403  NA 140 141  K 4.0 3.4*  CL 104 102  CO2 27 34*  GLUCOSE 100* 105*  BUN 34* 32*  CREATININE 0.91 0.99  CALCIUM 8.0* 7.6*    PT/INR: No results for input(s): LABPROT, INR in the last 72 hours. ABG    Component Value Date/Time   PHART 7.351 12/13/2018 0659   HCO3 22.2 12/13/2018 0659   TCO2 23 12/13/2018 0659   ACIDBASEDEF 3.0 (H) 12/13/2018 0659   O2SAT 98.0 12/13/2018 0659   CBG (last 3)  Recent Labs    12/17/18 1518 12/17/18 2232 12/18/18 0655  GLUCAP 105* 90 100*    Assessment/Plan: S/P Procedure(s) (LRB): Mediastinal Re-Exploration after bypass graft (N/A) Diuresis Plan for transfer to step-down: see transfer orders  D/c DA; increase midodrine Iron supp   LOS: 6 days    [name] 12/18/2018

## 2018-12-18 NOTE — Progress Notes (Signed)
Pt BP maintaining SBP>100, MAP >70 off Dopamine infusion x2 hours. RN will continue to monitor.

## 2018-12-18 NOTE — H&P (Signed)
Physical Medicine and Rehabilitation Admission H&P    CC: Debility   HPI: Frank Love is a 63 year old male with history of HTN, endstage DJD right hip, CAD with systolic CHF who was admitted on 12/12/18 for CABG X 4 by Dr.Adkins. Post op had excessive output from chest tube requiring FFP, cryoprecipitate and 2 units PRBC. He was taken back to OR later that evening for mediastinal reexploration due to post op rebleeding. He has had issues with fluid overload requiring diuresis with dopamine as well as neosynephrine due to hypotension. Midodrine added for BP support and dopamine discontinued 11/24. Follow up echo 11/23 showed EF 30-35% with global hypokinesis and LV thrombus excluded with contrast.  Blood pressures stabilizing with resolution of dizziness therefore midodrine weaned off. Dr. Rayann Heman following to assist with acute on chronic systolic failure with peripheral edema being treated with addition of spironolactone. ABLA and thrombocytopenia being monitored. Hypokalemia resolved with supplementation and pre-renal azotemia stable. Therapy ongoing and CIR recommended due to debility.    Review of Systems  Constitutional: Negative for chills and fever.  HENT: Negative for hearing loss and tinnitus.   Eyes: Negative for blurred vision and double vision.  Respiratory: Negative for cough and shortness of breath.   Cardiovascular: Negative for chest pain and palpitations.  Gastrointestinal: Negative for heartburn and nausea.  Genitourinary: Positive for urgency (has problems getting there. ). Negative for dysuria.  Musculoskeletal: Positive for falls (due to right hip giving out) and joint pain (bone on bone right hip with pain).  Skin: Negative for rash.  Neurological: Positive for dizziness (with quick movements), focal weakness (RLE weakness due to right hip OA) and weakness.  Psychiatric/Behavioral: Negative for depression. The patient is not nervous/anxious.      Past Medical  History:  Diagnosis Date   Chronic pain of right knee    Coronary artery disease    Hypertension    Myocardial infarction [facility])    possible in the past was told by a doctor but no one has ever said anything else   Phimosis     Past Surgical History:  Procedure Laterality Date   CIRCUMCISION N/A 10/12/2018   Procedure: CIRCUMCISION ADULT;  Surgeon: Franchot Gallo, MD;  Location: [facility];  Service: Urology;  Laterality: N/A;  45 mins   COLONOSCOPY     CORONARY ARTERY BYPASS GRAFT N/A 12/12/2018   Procedure: CORONARY ARTERY BYPASS GRAFTING (CABG) x four, using bilateral internal mammary arteries and right leg greater saphenous vein harvested endoscopically;  Surgeon: [name], MD;  Location: [place];  Service: Open Heart Surgery;  Laterality: N/A;   LEFT HEART CATH AND CORONARY ANGIOGRAPHY N/A 11/28/2018   Procedure: LEFT HEART CATH AND CORONARY ANGIOGRAPHY;  Surgeon: Burnell Blanks, MD;  Location: [place] [facility];  Service: Cardiovascular;  Laterality: N/A;   MEDIASTINAL EXPLORATION N/A 12/12/2018   Procedure: Mediastinal Re-Exploration after bypass graft;  Surgeon: [name], MD;  Location: [place];  Service: Open Heart Surgery;  Laterality: N/A;   TEE WITHOUT CARDIOVERSION N/A 12/12/2018   Procedure: TRANSESOPHAGEAL ECHOCARDIOGRAM (TEE);  Surgeon: [name], MD;  Location: [place];  Service: Open Heart Surgery;  Laterality: N/A;   TOOTH EXTRACTION      Family History  Problem Relation Age of Onset   Diabetes Mother    Heart disease Mother     Social History:  Married. 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 He reports that he has never smoked.  He has never used smokeless tobacco. He reports that he does not drink alcohol or use drugs.   Allergies: No Known Allergies    Medications Prior to Admission  Medication Sig Dispense Refill   aspirin EC 81 MG tablet Take 81 mg by mouth at bedtime.      metoprolol tartrate  (LOPRESSOR) 25 MG tablet Take 1 tablet (25 mg total) by mouth 2 (two) times daily. 180 tablet 1   sacubitril-valsartan (ENTRESTO) 24-26 MG Take 1 tablet by mouth 2 (two) times daily. 60 tablet 3   nitroGLYCERIN (NITROSTAT) 0.4 MG SL tablet Place 1 tablet (0.4 mg total) under the tongue every 5 (five) minutes as needed for chest pain. 25 tablet 3    Drug Regimen Review  Drug regimen was reviewed and remains appropriate with no significant issues identified  Home: Home Living Family/patient expects to be discharged to:: Private residence Living Arrangements: Spouse/significant other Available Help at Discharge: Family, Available 24 hours/day Type of Home: Apartment Home Access: Stairs to enter [employer] of Steps: 3+5 Entrance Stairs-Rails: Right Home Layout: One level Bathroom Shower/Tub: Chiropodist: Standard Home Equipment: Environmental consultant - 2 wheels, Cane - single point Additional Comments: Pt sleeps in armchair with feet on ottoman   Functional History: Prior Function Level of Independence: Independent with assistive device(s) Comments: Used cane due to bad rt hip. Wife reports several falls at home  Functional Status:  Mobility: Bed Mobility Overal bed mobility: Needs Assistance Bed Mobility: Supine to Sit Supine to sit: Mod assist, HOB elevated Sit to sidelying: Mod assist, HOB elevated General bed mobility comments: pt able to bring legs off bed, mod assist to ascend trunk; increased time and effort with cueing for technique Transfers Overall transfer level: Needs assistance Equipment used: Rolling walker (2 wheeled) Transfers: Sit to/from Stand Sit to Stand: Mod assist, Max assist, From elevated surface Stand pivot transfers: Min assist General transfer comment: from elevated EOB mod assist, from recliner max assist using momentum holding onto heart pillow to ascend into standing, with support to power up and steady; small shuffling steps to  recliner givne min assist Ambulation/Gait Ambulation/Gait assistance: Min assist, +2 safety/equipment Gait Distance (Feet): 26 Feet(+30') Assistive device: Rolling walker (2 wheeled) Gait Pattern/deviations: Wide base of support, Step-to pattern, Trunk flexed, Antalgic, Shuffle General Gait Details: Slow, unsteady and antalgic like gait with short step lengths bilaterally. Wide BoS. 1 seated rest break. VSS. Gait velocity: decreased    ADL: ADL Overall ADL's : Needs assistance/impaired Eating/Feeding: Set up, Sitting Grooming: Set up, Sitting, Oral care Grooming Details (indicate cue type and reason): seated in recliner  Upper Body Bathing: Moderate assistance, Sitting Lower Body Bathing: Total assistance Lower Body Bathing Details (indicate cue type and reason): Mod A +2 sit<>stand from recliner with momentum Upper Body Dressing : Maximal assistance, Sitting Lower Body Dressing: Total assistance, Sit to/from stand Lower Body Dressing Details (indicate cue type and reason): requires assist to adjust socks, mod-max assist sit to stand  Toilet Transfer: Moderate assistance, Maximal assistance, Stand-pivot, RW Toilet Transfer Details (indicate cue type and reason): simulated to recliner Toileting- Clothing Manipulation and Hygiene: Total assistance Toileting - Clothing Manipulation Details (indicate cue type and reason): Mod A +2 sit<>stand from recliner with momentum Functional mobility during ADLs: Moderate assistance, Maximal assistance, Rolling walker, Cueing for safety, Cueing for sequencing General ADL Comments: provided handout for sternal precautions for pt to review, verbally educated precautions but required cueing to adhere functioanlly  Cognition: Cognition Overall Cognitive Status:  Within Functional Limits for tasks assessed Orientation Level: Oriented X4 Cognition Arousal/Alertness: Awake/alert Behavior During Therapy: WFL for tasks assessed/performed Overall Cognitive  Status: Within Functional Limits for tasks assessed   Blood pressure 113/64, pulse 69, temperature 98.7 F (37.1 C), temperature source Oral, resp. rate 20, height 6\' 3"  (1.905 m), weight 105.2 kg, SpO2 96 %. Physical Exam  Nursing note and vitals reviewed. Constitutional: He is oriented to person, place, and time. He appears well-developed and well-nourished. No distress.  HENT:  Head: Normocephalic and atraumatic.  Eyes: Pupils are equal, round, and reactive to light.  Neck: Normal range of motion.  Cardiovascular: Normal rate and regular rhythm.  Respiratory: Effort normal. Stridor present. No respiratory distress. He has decreased breath sounds in the right lower field and the left lower field. He exhibits tenderness.  Chest wall with staples in place.   GI: He exhibits no distension. There is no abdominal tenderness.  Genitourinary:    Genitourinary Comments: Scrotal edema   Musculoskeletal:     Comments: RLE weakness with decreased ROM due to right hip pain and min edema right knee.   Neurological: He is alert and oriented to person, place, and time. No cranial nerve deficit. Coordination normal.  UE motor 4/5. LE 4- to 4/5 prox to distal. No sensory findings. Cognitively appropriate.   Skin: No rash noted. He is not diaphoretic. No erythema.  Psychiatric: He has a normal mood and affect. His behavior is normal. Judgment and thought content normal.    Results for orders placed or performed during the hospital encounter of 12/12/18 (from the past 48 hour(s))  Basic metabolic panel     Status: Abnormal   Collection Time: 12/20/18  8:46 AM  Result Value Ref Range   Sodium 141 135 - 145 mmol/L   Potassium 3.9 3.5 - 5.1 mmol/L   Chloride 103 98 - 111 mmol/L   CO2 29 22 - 32 mmol/L   Glucose, Bld 130 (H) 70 - 99 mg/dL   BUN 29 (H) 8 - 23 mg/dL   Creatinine, Ser 1.17 0.61 - 1.24 mg/dL   Calcium 8.4 (L) 8.9 - 10.3 mg/dL   GFR calc non Af Amer >60 >60 mL/min   GFR calc Af Amer >60  >60 mL/min   Anion gap 9 5 - 15    Comment: Performed at [facility], [place] [address] [place], [place] [id number]  Basic metabolic panel     Status: Abnormal   Collection Time: 12/21/18  3:01 AM  Result Value Ref Range   Sodium 144 135 - 145 mmol/L   Potassium 3.7 3.5 - 5.1 mmol/L   Chloride 105 98 - 111 mmol/L   CO2 28 22 - 32 mmol/L   Glucose, Bld 103 (H) 70 - 99 mg/dL   BUN 28 (H) 8 - 23 mg/dL   Creatinine, Ser 1.06 0.61 - 1.24 mg/dL   Calcium 8.1 (L) 8.9 - 10.3 mg/dL   GFR calc non Af Amer >60 >60 mL/min   GFR calc Af Amer >60 >60 mL/min   Anion gap 11 5 - 15    Comment: Performed at [facility], [place] [address], [place], [place] [address]   Dg Chest 2 View  Result Date: 12/19/2018 CLINICAL DATA:  Postop complication. Status post CABG on 12/12/2018. EXAM: CHEST - 2 VIEW COMPARISON:  12/18/2018 FINDINGS: Numerous leads and wires project over the chest. Prior median sternotomy. Midline trachea. Mild cardiomegaly. Atherosclerosis in the transverse aorta. Left larger  than right pleural effusions are small, likely similar given differences in technique. The tiny left apical pneumothorax has improved to resolved. Pleural thickening versus a less than 5% left apical pneumothorax. No right-sided pneumothorax identified. Improved left and similar right base atelectasis. IMPRESSION: Slightly improved aeration, with decreased bibasilar atelectasis. Small bilateral pleural effusions are not significantly changed. Improved to resolved tiny left apical pneumothorax. Aortic Atherosclerosis (ICD10-I70.0). Electronically Signed   By: Abigail Miyamoto M.D.   On: 12/19/2018 16:40   Nm Pulmonary Perfusion  Result Date: 12/19/2018 CLINICAL DATA:  Heart failure, lower extremity edema, elevated BNP EXAM: NUCLEAR MEDICINE PERFUSION LUNG SCAN TECHNIQUE: Perfusion images were obtained in multiple projections after intravenous injection of radiopharmaceutical. Ventilation scans intentionally deferred if  perfusion scan and chest x-ray adequate for interpretation during COVID 19 epidemic. RADIOPHARMACEUTICALS:  1.45 mCi Tc-36m MAA IV COMPARISON:  Correlation with chest radiographs dated 12/19/2018 FINDINGS: No wedge-shaped perfusion defects to suggest pulmonary embolism. Ventilation imaging was deferred. When correlating with chest radiographs, there is a small left pleural effusion. IMPRESSION: Negative for pulmonary embolism. Electronically Signed   By: Julian Hy M.D.   On: 12/19/2018 18:13       Medical Problem List and Plan: 1.  Functional and mobility deficits secondary to CABG x4 and associated post-op complications with subsequent debility  -patient may  shower  -ELOS/Goals: 7-11 days, mod I goals 2.  Antithrombotics: -DVT/anticoagulation:  Mechanical: Sequential compression devices, below knee Bilateral lower extremities  -antiplatelet therapy: ASA/PLavix 3. Chronic right hip pain/Pain Management: tylenol prn.  Has not used any oxycodone in 48 hrs--continue prn.  4. Mood: LCSW to follow for evaluation and support.   -antipsychotic agents: N/A 5. Neuropsych: This patient is capable of making decisions on his own behalf. 6. Skin/Wound Care: Monitor incisions for healing. Continue MVI. Add protein supplement. Per CVTA- pacing sutures to come out Monday and staples to be d/c 12/1.  7. Fluids/Electrolytes/Nutrition: Strict  I/O. Check lytes in am.  8. Acute on chronic systolic CHF: TEDs for peripheral edema. Monitor weight daily. ON ASA, Lipitor,  coreg bid, lanoxin, lasix 40 mg daily and Spironolactone to 25 mg on 11/27 9. ABLA:  Recheck in am. Continue iron supplement.  10. Pre-renal azotemia: stable 34-->28. Will monitor for now--recheck labs tomorrow 11. Thrombocytopenia: Resolving.       Bary Leriche, PA-C 12/21/2018

## 2018-12-19 ENCOUNTER — Inpatient Hospital Stay (HOSPITAL_COMMUNITY): Payer: No Typology Code available for payment source

## 2018-12-19 DIAGNOSIS — I5023 Acute on chronic systolic (congestive) heart failure: Secondary | ICD-10-CM

## 2018-12-19 DIAGNOSIS — Z951 Presence of aortocoronary bypass graft: Secondary | ICD-10-CM

## 2018-12-19 LAB — GLUCOSE, CAPILLARY: Glucose-Capillary: 97 mg/dL (ref 70–99)

## 2018-12-19 LAB — BASIC METABOLIC PANEL
Anion gap: 7 (ref 5–15)
BUN: 31 mg/dL — ABNORMAL HIGH (ref 8–23)
CO2: 30 mmol/L (ref 22–32)
Calcium: 7.8 mg/dL — ABNORMAL LOW (ref 8.9–10.3)
Chloride: 105 mmol/L (ref 98–111)
Creatinine, Ser: 1.15 mg/dL (ref 0.61–1.24)
GFR calc Af Amer: >60 mL/min (ref >60)
GFR calc non Af Amer: >60 mL/min (ref >60)
Glucose, Bld: 100 mg/dL — ABNORMAL HIGH (ref 70–99)
Potassium: 3.6 mmol/L (ref 3.5–5.1)
Sodium: 142 mmol/L (ref 135–145)

## 2018-12-19 MED ORDER — TECHNETIUM TO 99M ALBUMIN AGGREGATED
1.4500 | Freq: Once | INTRAVENOUS | Status: AC | PRN
  Administered 2018-12-19: 1.45 via INTRAVENOUS

## 2018-12-19 MED ORDER — FUROSEMIDE 10 MG/ML IJ SOLN
40.0000 mg | Freq: Once | INTRAMUSCULAR | Status: AC
  Administered 2018-12-19: 40 mg via INTRAVENOUS
  Filled 2018-12-19: qty 4

## 2018-12-19 MED ORDER — POTASSIUM CHLORIDE CRYS ER 20 MEQ PO TBCR
20.0000 meq | EXTENDED_RELEASE_TABLET | Freq: Every day | ORAL | Status: DC
  Administered 2018-12-20 – 2018-12-21 (×2): 20 meq via ORAL
  Filled 2018-12-19 (×2): qty 1

## 2018-12-19 MED ORDER — ATORVASTATIN CALCIUM 40 MG PO TABS: 40.0000 mg | ORAL_TABLET | Freq: Every day | ORAL | Status: DC

## 2018-12-19 MED ORDER — MIDODRINE HCL 5 MG PO TABS: 5.0000 mg | ORAL_TABLET | Freq: Three times a day (TID) | ORAL | Status: DC

## 2018-12-19 MED ORDER — COLCHICINE 0.6 MG PO TABS: 0.6000 mg | ORAL_TABLET | Freq: Every day | ORAL | Status: DC

## 2018-12-19 MED ORDER — CLOPIDOGREL BISULFATE 75 MG PO TABS: 75.0000 mg | ORAL_TABLET | Freq: Every day | ORAL | Status: DC

## 2018-12-19 MED ORDER — CARVEDILOL 3.125 MG PO TABS: 3.1250 mg | ORAL_TABLET | Freq: Two times a day (BID) | ORAL | Status: DC

## 2018-12-19 MED ORDER — PANTOPRAZOLE SODIUM 20 MG PO TBEC
20.0000 mg | DELAYED_RELEASE_TABLET | Freq: Every day | ORAL | Status: DC
  Administered 2018-12-19 – 2018-12-21 (×3): 20 mg via ORAL
  Filled 2018-12-19 (×3): qty 1

## 2018-12-19 MED ORDER — FUROSEMIDE 40 MG PO TABS
40.0000 mg | ORAL_TABLET | Freq: Every day | ORAL | Status: DC
  Administered 2018-12-19 – 2018-12-21 (×3): 40 mg via ORAL
  Filled 2018-12-19 (×3): qty 1

## 2018-12-19 MED ORDER — MIDODRINE HCL 2.5 MG PO TABS
2.5000 mg | ORAL_TABLET | Freq: Three times a day (TID) | ORAL | Status: DC
  Administered 2018-12-19: 2.5 mg via ORAL
  Filled 2018-12-19 (×3): qty 1

## 2018-12-19 MED ORDER — FUROSEMIDE 40 MG PO TABS: 40.0000 mg | ORAL_TABLET | Freq: Every day | ORAL | Status: DC

## 2018-12-19 MED ORDER — DIGOXIN 125 MCG PO TABS
0.1250 mg | ORAL_TABLET | Freq: Every day | ORAL | Status: DC
  Administered 2018-12-19 – 2018-12-21 (×3): 0.125 mg via ORAL
  Filled 2018-12-19 (×3): qty 1

## 2018-12-19 MED ORDER — OXYCODONE HCL 5 MG PO TABS: 5.0000 mg | ORAL_TABLET | ORAL | 0 refills | Status: DC | PRN

## 2018-12-19 MED ORDER — ATORVASTATIN CALCIUM 40 MG PO TABS
40.0000 mg | ORAL_TABLET | Freq: Every day | ORAL | Status: DC
  Administered 2018-12-19 – 2018-12-20 (×2): 40 mg via ORAL
  Filled 2018-12-19 (×2): qty 1

## 2018-12-19 MED ORDER — POTASSIUM CHLORIDE CRYS ER 20 MEQ PO TBCR
30.0000 meq | EXTENDED_RELEASE_TABLET | Freq: Two times a day (BID) | ORAL | Status: AC
  Administered 2018-12-19 (×2): 30 meq via ORAL
  Filled 2018-12-19 (×2): qty 1

## 2018-12-19 MED ORDER — POTASSIUM CHLORIDE CRYS ER 20 MEQ PO TBCR: 20.0000 meq | EXTENDED_RELEASE_TABLET | Freq: Every day | ORAL | Status: DC

## 2018-12-19 NOTE — Progress Notes (Signed)
CARDIAC REHAB PHASE I   D/c education completed with pt and wife. Pt educated on importance of site care and monitoring incisions daily. Encouraged continued IS use, walks, and sternal precautions. Pt given in-the-tube sheet along with heart healthy diet. Reviewed restrictions and exercise guidelines. Will refer to CRP II [place], pt may need to delay attendance due to limited hip mobility.  [place], RN BSN 12/19/2018 2:28 PM

## 2018-12-19 NOTE — Progress Notes (Signed)
Inpatient Rehabilitation-Admissions Coordinator   [facility] discussed case with PM&R MD, Dr. Ranell Patrick. Due to soft BP, admitting MD would like to hold on admission.  Due to holiday, will follow up with pt Friday, 11/27 for possible admit to CIR.   Jhonnie Garner, OTR/L  Rehab Admissions Coordinator  [phone] 12/19/2018 2:54 PM

## 2018-12-19 NOTE — Progress Notes (Signed)
Pt voided 280 ml urine out put night shift. Bladder scan post voided found residual urine retained 130 ml. Will continue to monitor.   [name] [id number]

## 2018-12-19 NOTE — Consult Note (Addendum)
CARDIOLOGY CONSULT NOTE   Referring Physician: Dr. Orvan Seen, MD Primary Physician: Frank Rankins, NP Primary Cardiologist: Dr. Percival Spanish, MD Reason for Consultation: New Heart Failure   HPI:  Frank Love. Bergeman is a 63yo male with PMH HTN, LE edema, and elevated BNP who presented to [facility] 11/18 for CABGx4. He initially was seen by PCP 5/22 with elevated BNP and was started on lasix with cardiology referral, lasix was subsequently increased to 40 mg bid due to ongoing LE edema. He underwent cardiac evaluation for right hip surgery in September with continued symptoms of LE edema on lasix 40 bid. Echo showed systolic HF with EF 99991111. He was started on entresto, metoprolol at this time. He underwent cath 11/04 which showed multivessel disease, now s/p CABGx4 11/18. He was returned to OR for reexploration due to increased sanguinous o/p in his chest tube with resolution of pinpoint bleeding with suture.  Diuresis after surgery initially limited by hypotension requiring NE and dopamine. These were weaned off with addition of midodrine. Dopamine stopped 11/24. Possible transfer to CIR tomorrow.  Repeat echo done 11/23 showed severely decreased right ventricular systolic function with severe enlargement in addition to small pericardial effusion, otherwise similar to previous. Heart failure was consulted for management of patients new onset heart failure and soft blood pressure.  Today he denies shortness of breath or chest pain except for minor pain since surgery. He endorses continued swelling in his ankles. He has had intermittent nausea improved with zofran. He is able to lie flat. He denies dizziness. He has right hip pain that limits his movement and ambulation. 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   Currently blood pressures soft, 100/57. Given 40 IV lasix yesterday with net negative 1L. Creatinine has been stable at 1.07 today.      Review of Systems:     Cardiac Review of Systems: {Y] = yes [ ]  = no  Chest Pain [    ]  Resting SOB [   ] Exertional SOB  [  ]  Orthopnea [  ]   Pedal Edema [  y ]    Palpitations [  ] Syncope  [  ]   Presyncope [   ]  General Review of Systems: [Y] = yes [  ]=no Constitional: recent weight change [  ]; anorexia [  ]; fatigue Blue.Reese  ]; nausea [  ]; night sweats [  ]; fever [  ]; or chills [  ];                                                                     Eyes : blurred vision [  ]; diplopia [   ]; vision changes [  ];  Amaurosis fugax[  ]; Resp: cough [  ];  wheezing[  ];  hemoptysis[  ];  PND [  ];  GI:  gallstones[  ], vomiting[  ];  dysphagia[  ]; melena[  ];  hematochezia [  ]; heartburn[  ];   GU: kidney stones [  ]; hematuria[  ];   dysuria [  ];  nocturia[  ]; incontinence [  ];  Skin: rash, swelling[  ];, hair loss[  ];  peripheral edema[  ];  or itching[  ]; Musculosketetal: myalgias[  ];  joint swelling[  ];  joint erythema[  ];  joint pain[  ];  back pain[  ];  Heme/Lymph: bruising[  ];  bleeding[  ];  anemia[  ];  Neuro: TIA[  ];  headaches[  ];  stroke[  ];  vertigo[  ];  seizures[  ];   paresthesias[  ];  difficulty walking[  ];  Psych:depression[  ]; anxiety[  ];  Endocrine: diabetes[  ];  thyroid dysfunction[  ];  Other:  Past Medical History:  Diagnosis Date   Chronic pain of right knee    Coronary artery disease    Hypertension    Myocardial infarction [facility])    possible in the past was told by a doctor but no one has ever said anything else   Phimosis     Medications Prior to Admission  Medication Sig Dispense Refill   aspirin EC 81 MG tablet Take 81 mg by mouth at bedtime.      metoprolol tartrate (LOPRESSOR) 25 MG tablet Take 1 tablet (25 mg total) by mouth 2 (two) times daily. 180 tablet 1   sacubitril-valsartan (ENTRESTO) 24-26 MG Take 1 tablet by mouth 2 (two) times daily. 60 tablet 3   nitroGLYCERIN (NITROSTAT) 0.4 MG SL tablet  Place 1 tablet (0.4 mg total) under the tongue every 5 (five) minutes as needed for chest pain. 25 tablet 3      aspirin EC  81 mg Oral Daily   bisacodyl  10 mg Oral Daily   Or   bisacodyl  10 mg Rectal Daily   carvedilol  3.125 mg Oral BID WC   Chlorhexidine Gluconate Cloth  6 each Topical Daily   clopidogrel  75 mg Oral Daily   colchicine  0.6 mg Oral Daily   docusate sodium  200 mg Oral Daily   ferrous [id number] C-folic acid  1 capsule Oral BID PC   furosemide  40 mg Oral Daily   midodrine  5 mg Oral TID WC   multivitamins with iron  1 tablet Oral Daily   pantoprazole  20 mg Oral Daily   potassium chloride  30 mEq Oral BID   [START ON 12/20/2018] potassium chloride  20 mEq Oral Daily    Infusions:   No Known Allergies  Social History   Socioeconomic History   Marital status: Married    Spouse name: Not on file   Number of children: Not on file   Years of education: Not on file   Highest education level: Not on file  Occupational History   Not on file  Social Needs   Financial resource strain: Not on file   Food insecurity    Worry: Not on file    Inability: Not on file   Transportation needs    Medical: Not on file    Non-medical: Not on file  Tobacco Use   Smoking status: Never Smoker   Smokeless tobacco: Never Used  Substance and Sexual Activity   Alcohol use: No    Alcohol/week: 0.0 standard drinks   Drug use: No   Sexual activity: Yes  Lifestyle   Physical activity    Days per week: Not on file    Minutes per session: Not on file   Stress: Not on file  Relationships   Social connections    Talks on phone: Not on file  Gets together: Not on file    Attends religious service: Not on file    Active member of club or organization: Not on file    Attends meetings of clubs or organizations: Not on file    Relationship status: Not on file   Intimate partner violence    Fear of current or ex partner: Not on  file    Emotionally abused: Not on file    Physically abused: Not on file    Forced sexual activity: Not on file  Other Topics Concern   Not on file  Social History Narrative   Lives with wife.      Family History  Problem Relation Age of Onset   Diabetes Mother    Heart disease Mother     PHYSICAL EXAM: Vitals:   12/19/18 0741 12/19/18 0955  BP: (!) 97/56 (!) 100/57  Pulse: 79 70  Resp: 20 17  Temp: 97.8 F (36.6 C)   SpO2: 99% 95%     Intake/Output Summary (Last 24 hours) at 12/19/2018 1006 Last data filed at 12/19/2018 0500 Gross per 24 hour  Intake 640 ml  Output 1130 ml  Net -490 ml    General:  NAD, No respiratory difficulty HEENT: central line removed, dressing c/d/i Neck: supple. mild JVD. Carotids 2+ bilat; no bruits. No lymphadenopathy or thryomegaly appreciated. Cor: PMI nondisplaced. Regular rate & rhythm. No rubs, gallops or murmurs. Lungs: CTA, no w/r/r  Abdomen: soft, nontender, nondistended. No hepatosplenomegaly. No bruits or masses. Good bowel sounds. Extremities: no cyanosis, clubbing, rash, edema Neuro: alert & oriented x 3, cranial nerves grossly intact. moves all 4 extremities w/o difficulty. Affect pleasant.  ECG:  Results for orders placed or performed during the hospital encounter of 12/12/18 (from the past 24 hour(s))  Glucose, capillary     Status: None   Collection Time: 12/18/18 11:30 AM  Result Value Ref Range   Glucose-Capillary 88 70 - 99 mg/dL  Glucose, capillary     Status: None   Collection Time: 12/18/18  4:58 PM  Result Value Ref Range   Glucose-Capillary 93 70 - 99 mg/dL  Glucose, capillary     Status: None   Collection Time: 12/18/18  9:50 PM  Result Value Ref Range   Glucose-Capillary 85 70 - 99 mg/dL   Comment 1 Notify RN    Comment 2 Document in Chart   Basic metabolic panel     Status: Abnormal   Collection Time: 12/19/18  2:40 AM  Result Value Ref Range   Sodium 142 135 - 145 mmol/L   Potassium 3.6 3.5 -  5.1 mmol/L   Chloride 105 98 - 111 mmol/L   CO2 30 22 - 32 mmol/L   Glucose, Bld 100 (H) 70 - 99 mg/dL   BUN 31 (H) 8 - 23 mg/dL   Creatinine, Ser 1.15 0.61 - 1.24 mg/dL   Calcium 7.8 (L) 8.9 - 10.3 mg/dL   GFR calc non Af Amer >60 >60 mL/min   GFR calc Af Amer >60 >60 mL/min   Anion gap 7 5 - 15  Glucose, capillary     Status: None   Collection Time: 12/19/18  6:24 AM  Result Value Ref Range   Glucose-Capillary 97 70 - 99 mg/dL   Comment 1 Notify RN    Comment 2 Document in Chart    Dg Chest 2 View  Result Date: 12/18/2018 CLINICAL DATA:  63 year old male status post CABG.  Mid chest pain. EXAM: CHEST - 2  VIEW COMPARISON:  Chest x-ray 12/17/2018. FINDINGS: Right IJ Cordis with tip terminating in the proximal superior vena cava. Lung volumes are low. Opacity in the left lower lobe compatible with residual postoperative subsegmental atelectasis. Small left pleural effusion. Right lung is clear. No right pleural effusion. Trace residual left apical pneumothorax, decreased compared to the prior. No right pneumothorax. No evidence of pulmonary edema. Heart size is borderline enlarged. The patient is rotated to the left on today's exam, resulting in distortion of the mediastinal contours and reduced diagnostic sensitivity and specificity for mediastinal pathology. Aortic atherosclerosis. Status post median sternotomy for CABG. IMPRESSION: 1. Previously noted right-sided pneumothorax is no longer identified. Trace residual left apical pneumothorax (less than 5% of the volume of the left hemithorax) has decreased slightly. Persistent left lower lobe postoperative subsegmental atelectasis appears slightly improved. 2. Aortic atherosclerosis. Electronically Signed   By: Vinnie Langton M.D.   On: 12/18/2018 09:29   Cath 11/28/18  Mid RCA lesion is 100% stenosed.  Ost LM to Mid LM lesion is 20% stenosed.  2nd Mrg lesion is 100% stenosed.  Mid LAD-1 lesion is 99% stenosed.  Mid LAD-2 lesion is  100% stenosed.   1. The LAD is a large caliber vessel that courses the apex. There is a severe mid stenosis just after the first diagonal branch. The mid vessel is then occluded beyond the third diagonal branch. The distal LAD fills from left to left collaterals.  2. The Circumflex is a large caliber vessel. There is mild diffuse disease. The second obtuse marginal branch appears to be small in caliber and is chronically occluded, filling from left to left collaterals.  4. The RCA is a large dominant vessel with a long segment of chronic occlusion in the mid vessel. The distal vessel fills from right to right collaterals and left to right collaterals.  5. LVEDP 15 mmHg  ECHO 12/12/2018 Severely reduced LV systolic function, 99991111, mild dilation, no increased thickness  CABG 11/18 Left Internal Mammary Artery to Distal Left Anterior Descending Coronary Artery; pedicled RIMAGraft to Posterior Descending Coronary Artery;Saphenous Vein Graft to1stObtuse Marginal Branch of Left Circumflex Coronary Artery;Sapheonous Vein Graft toacute margionalBranchRightCoronary Artery;Endoscopic Vein Harvest fromrightThigh;  Bilateral IMA harvesting Completion graft surveillance with indocyanine green fluorescence imaging (SPY) Rigid sternal reconstruction with linear plating system (KLS) by Dr. Orvan Seen on 12/12/2018  ECHO 12/17/2018  1. Left ventricular ejection fraction, by visual estimation, is 30 to 35%. The left ventricle has severely decreased function. There is no left ventricular hypertrophy.  2. Definity contrast agent was given IV to delineate the left ventricular endocardial borders.  3. Mild to moderately dilated left ventricular internal cavity size.  4. The left ventricle demonstrates global hypokinesis.  5. Global right ventricle has severely reduced systolic function.The right ventricular size is severely enlarged. Right vetricular wall thickness was not assessed.  6. Left atrial size  was not assessed.  7. Right atrial size was not assessed.  8. Small pericardial effusion.  9. The mitral valve is grossly normal. Trace mitral valve regurgitation. 10. The tricuspid valve is normal in structure. Tricuspid valve regurgitation is mild. 11. The aortic valve is normal in structure. Aortic valve regurgitation is not visualized. 12. The pulmonic valve was grossly normal. Pulmonic valve regurgitation is not visualized. 13. RV appears to be moderately-severely enlarged (comparable to LV) with severely reduced function. Significantly worse compared to images from 10/02/18. 14. The interatrial septum was not assessed.   ASSESSMENT:  Frank Love. Frank Love is a 63yo male  with PMH HTN, LE edema, and elevated BNP with recent new diagnosis of LV systolic HF on echo [id number] and multivessel disease on cath who presented to [facility] 11/18 for CABGx4, now with signs of right systolic HF.   PLAN/DISCUSSION:  New Onset Right Sided Heart Failure Left Systolic Heart Failure [id number] Echo with LV EF 30-35% and normal RV function. He was started on entresto, metoprolol at this time. Now S/P cabgx4 11/18 with intraoperative ECHO showing normal RV function. He now has progression to right sided HF with severe RV dysfunction and hypokinesis on echo 11/23. Currently HD stable but has been requiring midodrine to maintain bp, which continues to be soft.  - progression to [facility] since surgery, may eventually need RHC.   - mild LE edema, diuresing well with IV lasix 40 mg today, near baseline weight ~125.  - cont. Coreg 3.125 mg bid, cannot do dig 2/2 HR 70s.  - switch IV lasix to po 40 mg po tomorrow - start 12.5 mg spiro today  - cont. To monitor renal function.  - decrease midodrine to 2.5 mg tid with plan to wean.   CAD s/p CABG  cath 11/04 which showed multivessel disease, now s/p CABGx4 11/18. On asa and plavix.  - plan for transfer to CIR tomorrow.  - cont. Asa and plavix.   Acute Anemia s/p CABG Last hgb  7.9 11/23. Repeat CBC, transfuse <7.   Seawell, Jaimie A, DO 12/19/18  Patient seen with resident, agree with the above note.   He is now post-op CABG.  Echo post-op on 11/23 showed EF 30-35% with moderate to severe RV dysfunction.  LV function stable compared to pre-op, but RV looked worse (mildly hypokinetic pre-op). Interestingly, there is RV apical preservation with free wall hypokinesis concerning for McConnell's sign.   He feels good, has been walking in the halls.  He has been on midodrine for low BP.  Denies lightheadedness.   General: NAD Neck: JVP 8 cm, no thyromegaly or thyroid nodule.  Lungs: Clear to auscultation bilaterally with normal respiratory effort. CV: Nondisplaced PMI.  Heart regular S1/S2, no S3/S4, no murmur.  Trace ankle edema.   Abdomen: Soft, nontender, no hepatosplenomegaly, no distention.  Skin: Intact without lesions or rashes.  Neurologic: Alert and oriented x 3.  Psych: Normal affect. Extremities: No clubbing or cyanosis.  HEENT: Normal.   1. CAD: S/p CABG.   - Continue ASA 81 + Plavix.  - Continue statin.  2. Acute on chronic systolic CHF: Ischemic cardiomyopathy.  Post-op echo was reviewed, showed EF 30-35%, moderate-severe RV dysfunction.  On exam, he is probably mildly volume overloaded.  He has been on midodrine for low BP but SBP currently in the 100-110 range.  - Continue Lasix 40 mg daily.  - Decrease midodrine to 2.5 mg tid.  - Add spironolactone 12.5 daily.  - Add digoxin 0.125 daily.  - With preserved RV apical function and RV free wall severe hypokinesis, concern for McConnell's sign with PE. RV pre-op and intra-op looked significantly better.  However, normal oxygen saturation argues against PE.  Will obtain V/Q scan today to rule out PE.  3. Anemia: Post-op blood loss.   Plan to go to CIR.  We will follow to see if we can titrate off midodrine and onto HF meds.   [name] 12/19/2018 3:35 PM

## 2018-12-19 NOTE — Progress Notes (Signed)
Epicardial pacing wires were removed without difficulty. Patient tolerated procedure well. Patient and nurse informed no ambulation until after 10:30 this am.

## 2018-12-19 NOTE — Progress Notes (Addendum)
      [address]       [place] [address]             [phone]        7 Days Post-Op Procedure(s) (LRB): Mediastinal Re-Exploration after bypass graft (N/A)  Subjective: Patient without specific complaints this am  Objective: Vital signs in last 24 hours: Temp:  [98.3 F (36.8 C)-98.7 F (37.1 C)] 98.6 F (37 C) (11/25 0405) Pulse Rate:  [63-86] 70 (11/25 0600) Cardiac Rhythm: Normal sinus rhythm (11/25 0405) Resp:  [14-36] 20 (11/25 0600) BP: (97-127)/(41-72) 100/67 (11/25 0405) SpO2:  [92 %-100 %] 92 % (11/25 0600) Weight:  [107 kg] 107 kg (11/25 0500)  Pre op weight 105.2 kg Current Weight  12/19/18 107 kg      Intake/Output from previous day: 11/24 0701 - 11/25 0700 In: 768.5 [P.O.:610; I.V.:8.5; IV Piggyback:150] Out: 1930 [Urine:1930]   Physical Exam:  Cardiovascular: RRR Pulmonary: Slightly diminished breath sounds on left Abdomen: Soft, non tender, bowel sounds present. Extremities: Mild bilateral lower extremity edema. Wounds: Clean and dry.  No erythema or signs of infection.  Lab Results: CBC: Recent Labs    12/17/18 0403  WBC 5.9  HGB 7.9*  HCT 24.5*  PLT 122*   BMET:  Recent Labs    12/18/18 0840 12/19/18 0240  NA 140 142  K 3.5 3.6  CL 105 105  CO2 32 30  GLUCOSE 214* 100*  BUN 28* 31*  CREATININE 0.92 1.15  CALCIUM 8.0* 7.8*    PT/INR:  Lab Results  Component Value Date   INR 1.8 (H) 12/12/2018   INR 1.5 (H) 12/12/2018   INR 1.0 12/11/2018   ABG:  INR: Will add last result for INR, ABG once components are confirmed Will add last 4 CBG results once components are confirmed  Assessment/Plan:  1. CV - SR with HR in the 70's. On Coreg 3.125 mg bid, Midodrine 5 mg tid, Plavix 75 mg daily. Unsure why on BB and Midodrine. Await heart failure evaluation 2.  Pulmonary - On room air. Encourage incentive spirometer. 3. Volume Overload - Will give 40 mg of oral Lasix 4.  Acute blood loss anemia - Last H  and H stable at 7.9 and 24.5. Continue Trinsicon 5. CBGs 93/85/97. Pre op HGA1C 5.0. Will stop accu checks and SS PRN. 6. Supplement potassium 7. Mild thrombocytopenia-last platelets up to 122,000 8. I will return to remove EPW 9. Remove central line 10. Await evaluation for CIR;per Dr. Orvan Seen, patient is stable for [facility] 12/19/2018,7:24 AM

## 2018-12-19 NOTE — Progress Notes (Signed)
Transferred from [place] to 4E02 at 07:46 pm. Alert and oriented x 4. CHG bath given, call bell within reach. CCMD called with 2nd person verified, EKG was sinus rhythm on monitor, HR 60s-70s, BP 101/53- 127/62 mmHg.  On room air SPO2 97-100%, , afebrile. Denied pain on sternal wound, but he expressed his right hip was the most painful especially when ambulated. He was able to tolerated well, refused to take pain med at this time. Pacing wires rolled with tape and gauze, not in use. No acute distress noted. Will continue to monitor.  Chelly Dombeck [id number]

## 2018-12-19 NOTE — Evaluation (Signed)
Occupational Therapy Evaluation Patient Details Name: Frank Love MRN: [id number] DOB: 05/16/1955 Today's Date: 12/19/2018    History of Present Illness Pt adm for CABG x 4 on 11/18. Underwent urgent mediastinal exploration same day and found small bleed from one graft which was repaired with 1 suture. PMH - severe rt hip arthritis (was scheduled for THR), CAD, HTN   Clinical Impression   Patient supine in bed and agreeable to OT session.  Progressing slowly towards OT goals.  Mod-max assist for transfers using recliner, total assist for LB ADLs and setup for grooming seated in recliner.  Patient requires cueing to adhere to sternal precautions, provided handout to reinforce.  Pt eager and hopeful for CIR soon.  Continue to recommend CIR.  Will follow acutely.     Follow Up Recommendations  CIR;Supervision/Assistance - 24 hour    Equipment Recommendations  Other (comment)(TBD at next venue of care)    Recommendations for Other Services       Precautions / Restrictions Precautions Precautions: Fall;Sternal Precaution Booklet Issued: Yes (comment) Precaution Comments: right hip DJD Restrictions Weight Bearing Restrictions: Yes RLE Weight Bearing: Weight bearing as tolerated Other Position/Activity Restrictions: sternal precautions       Mobility Bed Mobility Overal bed mobility: Needs Assistance Bed Mobility: Supine to Sit     Supine to sit: Mod assist;HOB elevated     General bed mobility comments: pt able to bring legs off bed, mod assist to ascend trunk; increased time and effort with cueing for technique  Transfers Overall transfer level: Needs assistance Equipment used: Rolling walker (2 wheeled) Transfers: Sit to/from Stand Sit to Stand: Mod assist;Max assist;From elevated surface Stand pivot transfers: Min assist       General transfer comment: from elevated EOB mod assist, from recliner max assist using momentum holding onto heart pillow to ascend  into standing, with support to power up and steady; small shuffling steps to recliner givne min assist    Balance Overall balance assessment: Needs assistance Sitting-balance support: Feet supported;No upper extremity supported Sitting balance-Leahy Scale: Fair     Standing balance support: Bilateral upper extremity supported;During functional activity Standing balance-Leahy Scale: Poor Standing balance comment: Requires UE support in standing                           ADL either performed or assessed with clinical judgement   ADL Overall ADL's : Needs assistance/impaired     Grooming: Set up;Sitting;Oral care Grooming Details (indicate cue type and reason): seated in recliner              Lower Body Dressing: Total assistance;Sit to/from stand Lower Body Dressing Details (indicate cue type and reason): requires assist to adjust socks, mod-max assist sit to stand  Toilet Transfer: Moderate assistance;Maximal assistance;Stand-pivot;RW Armed forces technical officer Details (indicate cue type and reason): simulated to recliner         Functional mobility during ADLs: Moderate assistance;Maximal assistance;Rolling walker;Cueing for safety;Cueing for sequencing General ADL Comments: provided handout for sternal precautions for pt to review, verbally educated precautions but required cueing to adhere functioanlly     Vision         Perception     Praxis      Pertinent Vitals/Pain Pain Assessment: Faces Faces Pain Scale: Hurts a little bit Pain Location: sternum Pain Descriptors / Indicators: Sore Pain Intervention(s): Monitored during session;Repositioned     Hand Dominance     Extremity/Trunk Assessment  Communication     Cognition Arousal/Alertness: Awake/alert Behavior During Therapy: WFL for tasks assessed/performed Overall Cognitive Status: Within Functional Limits for tasks assessed                                      General Comments  VSS    Exercises     Shoulder Instructions      Home Living                                          Prior Functioning/Environment                   OT Problem List:        OT Treatment/Interventions:      OT Goals(Current goals can be found in the care plan section) Acute Rehab OT Goals Patient Stated Goal: to get over this so I can have my hip surgery OT Goal Formulation: With patient  OT Frequency: Min 2X/week   Barriers to D/C:            Co-evaluation              AM-PAC OT "6 Clicks" Daily Activity     Outcome Measure Help from another person eating meals?: None Help from another person taking care of personal grooming?: A Little Help from another person toileting, which includes using toliet, bedpan, or urinal?: Total Help from another person bathing (including washing, rinsing, drying)?: A Lot Help from another person to put on and taking off regular upper body clothing?: A Lot Help from another person to put on and taking off regular lower body clothing?: Total 6 Click Score: 13   End of Session Equipment Utilized During Treatment: Gait belt;Rolling walker Nurse Communication: Mobility status  Activity Tolerance: Patient tolerated treatment well Patient left: in chair;with call bell/phone within reach;with chair alarm set  OT Visit Diagnosis: Unsteadiness on feet (R26.81);Other abnormalities of gait and mobility (R26.89);Muscle weakness (generalized) (M62.81);Pain Pain - part of body: (chest)                Time: [id number] OT Time Calculation (min): 29 min Charges:  OT General Charges $OT Visit: 1 Visit OT Treatments $Self Care/Home Management : 23-37 mins  Delight Stare, [place] Pager [phone] Office [phone]   Delight Stare 12/19/2018, 12:48 PM

## 2018-12-20 DIAGNOSIS — I5022 Chronic systolic (congestive) heart failure: Secondary | ICD-10-CM

## 2018-12-20 LAB — BASIC METABOLIC PANEL
Anion gap: 9 (ref 5–15)
BUN: 29 mg/dL — ABNORMAL HIGH (ref 8–23)
CO2: 29 mmol/L (ref 22–32)
Calcium: 8.4 mg/dL — ABNORMAL LOW (ref 8.9–10.3)
Chloride: 103 mmol/L (ref 98–111)
Creatinine, Ser: 1.17 mg/dL (ref 0.61–1.24)
GFR calc Af Amer: >60 mL/min (ref >60)
GFR calc non Af Amer: >60 mL/min (ref >60)
Glucose, Bld: 130 mg/dL — ABNORMAL HIGH (ref 70–99)
Potassium: 3.9 mmol/L (ref 3.5–5.1)
Sodium: 141 mmol/L (ref 135–145)

## 2018-12-20 MED ORDER — SPIRONOLACTONE 12.5 MG HALF TABLET
12.5000 mg | ORAL_TABLET | Freq: Every day | ORAL | Status: DC
  Administered 2018-12-20: 12.5 mg via ORAL
  Filled 2018-12-20 (×2): qty 1

## 2018-12-20 NOTE — Plan of Care (Signed)
  Problem: Education: Goal: Knowledge of General Education information will improve Description: Including pain rating scale, medication(s)/side effects and non-pharmacologic comfort measures Outcome: Not Progressing   Problem: Health Behavior/Discharge Planning: Goal: Ability to manage health-related needs will improve Outcome: Not Progressing   Problem: Clinical Measurements: Goal: Ability to maintain clinical measurements within normal limits will improve Outcome: Not Progressing Goal: Will remain free from infection Outcome: Not Progressing Goal: Diagnostic test results will improve Outcome: Not Progressing Goal: Respiratory complications will improve Outcome: Not Progressing Goal: Cardiovascular complication will be avoided Outcome: Not Progressing   Problem: Activity: Goal: Risk for activity intolerance will decrease Outcome: Not Progressing   Problem: Nutrition: Goal: Adequate nutrition will be maintained Outcome: Not Progressing   Problem: Coping: Goal: Level of anxiety will decrease Outcome: Not Progressing   Problem: Pain Managment: Goal: General experience of comfort will improve Outcome: Not Progressing   Problem: Safety: Goal: Ability to remain free from injury will improve Outcome: Not Progressing   Problem: Skin Integrity: Goal: Risk for impaired skin integrity will decrease Outcome: Not Progressing   Problem: Education: Goal: Will demonstrate proper wound care and an understanding of methods to prevent future damage Outcome: Not Progressing Goal: Knowledge of disease or condition will improve Outcome: Not Progressing Goal: Knowledge of the prescribed therapeutic regimen will improve Outcome: Not Progressing Goal: Individualized Educational Video(s) Outcome: Not Progressing   Problem: Activity: Goal: Risk for activity intolerance will decrease Outcome: Not Progressing   Problem: Cardiac: Goal: Will achieve and/or maintain hemodynamic  stability Outcome: Not Progressing   Problem: Clinical Measurements: Goal: Postoperative complications will be avoided or minimized Outcome: Not Progressing   Problem: Respiratory: Goal: Respiratory status will improve Outcome: Not Progressing   Problem: Skin Integrity: Goal: Wound healing without signs and symptoms of infection Outcome: Not Progressing Goal: Risk for impaired skin integrity will decrease Outcome: Not Progressing   Problem: Urinary Elimination: Goal: Ability to achieve and maintain adequate renal perfusion and functioning will improve Outcome: Not Progressing

## 2018-12-20 NOTE — Progress Notes (Addendum)
      [address]       [place] [address]             [phone]      8 Days Post-Op Procedure(s) (LRB): Mediastinal Re-Exploration after bypass graft (N/A) Subjective: His left IV may need replaced due to extreme pain when using for medication administration.   Objective: Vital signs in last 24 hours: Temp:  [97.8 F (36.6 C)-98.9 F (37.2 C)] 98.7 F (37.1 C) (11/26 0415) Pulse Rate:  [60-83] 71 (11/26 0415) Cardiac Rhythm: Normal sinus rhythm (11/26 0035) Resp:  [17-20] 19 (11/26 0415) BP: (97-114)/(56-69) 106/58 (11/26 0415) SpO2:  [95 %-99 %] 96 % (11/26 0415) Weight:  [107.9 kg] 107.9 kg (11/26 0415)      Intake/Output from previous day: 11/25 0701 - 11/26 0700 In: 240 [P.O.:240] Out: 1800 [Urine:1800] Intake/Output this shift: No intake/output data recorded.  General appearance: alert, cooperative and no distress Heart: regular rate and rhythm, S1, S2 normal, no murmur, click, rub or gallop Lungs: clear to auscultation bilaterally Abdomen: soft, non-tender; bowel sounds normal; no masses,  no organomegaly Extremities: extremities normal, atraumatic, no cyanosis or edema Wound: clean and dry  Lab Results: No results for input(s): WBC, HGB, HCT, PLT in the last 72 hours. BMET:  Recent Labs    12/18/18 0840 12/19/18 0240  NA 140 142  K 3.5 3.6  CL 105 105  CO2 32 30  GLUCOSE 214* 100*  BUN 28* 31*  CREATININE 0.92 1.15  CALCIUM 8.0* 7.8*    PT/INR: No results for input(s): LABPROT, INR in the last 72 hours. ABG    Component Value Date/Time   PHART 7.351 12/13/2018 0659   HCO3 22.2 12/13/2018 0659   TCO2 23 12/13/2018 0659   ACIDBASEDEF 3.0 (H) 12/13/2018 0659   O2SAT 98.0 12/13/2018 0659   CBG (last 3)  Recent Labs    12/18/18 1658 12/18/18 2150 12/19/18 0624  GLUCAP 93 85 97    Assessment/Plan: S/P Procedure(s) (LRB): Mediastinal Re-Exploration after bypass graft (N/A)  1. CV - SR with HR in the 70's. On Coreg  3.125 mg bid, Midodrine 2.5 mg tid, Plavix 75 mg daily.  HF following and added Spirolactone and Digoxin.  2.  Pulmonary - On room air. Encourage incentive spirometer. 3. Volume Overload - Continue 40 mg of oral Lasix 4.  Acute blood loss anemia - Last H and H stable at 7.9 and 24.5. Continue Trinsicon 5. CBGs well controlled. Pre op HGA1C 5.0. SSI PRN 6. Potassium 3.6 yesterday-Supplemented 7. Mild thrombocytopenia-last platelets up to 122,000  Plan: CIR admission tomorrow. HF working on titrating off midodrine and onto HF medications.    LOS: 8 days    Elgie Collard 12/20/2018 Agree with assessment and plan. Appreciate Dr. Aundra Dubin input for medical management. Jarika Robben Z. Orvan Seen, [place]

## 2018-12-20 NOTE — Progress Notes (Signed)
Patient ID: CALLUM KUBICEK, male   DOB: 11/16/1955, 63 y.o.   MRN: [id number]     Advanced Heart Failure Rounding Note  PCP-Cardiologist: [name], MD   Subjective:    No complaints this morning, SBP 100-110.  Denies lightheadedness.  No chest pain.    Objective:   Weight Range: 107.9 kg Body mass index is 29.73 kg/m.   Vital Signs:   Temp:  [98.4 F (36.9 C)-98.9 F (37.2 C)] 98.4 F (36.9 C) (11/26 0758) Pulse Rate:  [60-83] 75 (11/26 0758) Resp:  [17-19] 19 (11/26 0758) BP: (100-114)/(57-69) 110/63 (11/26 0758) SpO2:  [95 %-99 %] 96 % (11/26 0758) Weight:  [107.9 kg] 107.9 kg (11/26 0415) Last BM Date: 12/18/18  Weight change: Filed Weights   12/18/18 0500 12/19/18 0500 12/20/18 0415  Weight: 105.4 kg 107 kg 107.9 kg    Intake/Output:   Intake/Output Summary (Last 24 hours) at 12/20/2018 0801 Last data filed at 12/20/2018 0421 Gross per 24 hour  Intake 120 ml  Output 1800 ml  Net -1680 ml      Physical Exam    General:  Well appearing. No resp difficulty HEENT: Normal Neck: Supple. JVP not elevated. Carotids 2+ bilat; no bruits. No lymphadenopathy or thyromegaly appreciated. Cor: PMI nondisplaced. Regular rate & rhythm. No rubs, gallops or murmurs. Lungs: Clear Abdomen: Soft, nontender, nondistended. No hepatosplenomegaly. No bruits or masses. Good bowel sounds. Extremities: No cyanosis, clubbing, rash, trace ankle edema.  Neuro: Alert & orientedx3, cranial nerves grossly intact. moves all 4 extremities w/o difficulty. Affect pleasant   Telemetry   NSR 70s (personally reviewed)  Labs    CBC No results for input(s): WBC, NEUTROABS, HGB, HCT, MCV, PLT in the last 72 hours. Basic Metabolic Panel Recent Labs    12/18/18 0840 12/19/18 0240  NA 140 142  K 3.5 3.6  CL 105 105  CO2 32 30  GLUCOSE 214* 100*  BUN 28* 31*  CREATININE 0.92 1.15  CALCIUM 8.0* 7.8*   Liver Function Tests No results for input(s): AST, ALT, ALKPHOS,  BILITOT, PROT, ALBUMIN in the last 72 hours. No results for input(s): LIPASE, AMYLASE in the last 72 hours. Cardiac Enzymes No results for input(s): CKTOTAL, CKMB, CKMBINDEX, TROPONINI in the last 72 hours.  BNP: BNP (last 3 results) Recent Labs    07/06/18 1017 07/09/18 1238 08/03/18 1024  BNP 1,721.2* 1,077.6* 316.2*    ProBNP (last 3 results) No results for input(s): PROBNP in the last 8760 hours.   D-Dimer No results for input(s): DDIMER in the last 72 hours. Hemoglobin A1C No results for input(s): HGBA1C in the last 72 hours. Fasting Lipid Panel No results for input(s): CHOL, HDL, LDLCALC, TRIG, CHOLHDL, LDLDIRECT in the last 72 hours. Thyroid Function Tests No results for input(s): TSH, T4TOTAL, T3FREE, THYROIDAB in the last 72 hours.  Invalid input(s): FREET3  Other results:   Imaging    Dg Chest 2 View  Result Date: 12/19/2018 CLINICAL DATA:  Postop complication. Status post CABG on 12/12/2018. EXAM: CHEST - 2 VIEW COMPARISON:  12/18/2018 FINDINGS: Numerous leads and wires project over the chest. Prior median sternotomy. Midline trachea. Mild cardiomegaly. Atherosclerosis in the transverse aorta. Left larger than right pleural effusions are small, likely similar given differences in technique. The tiny left apical pneumothorax has improved to resolved. Pleural thickening versus a less than 5% left apical pneumothorax. No right-sided pneumothorax identified. Improved left and similar right base atelectasis. IMPRESSION: Slightly improved aeration, with decreased bibasilar atelectasis. Small  bilateral pleural effusions are not significantly changed. Improved to resolved tiny left apical pneumothorax. Aortic Atherosclerosis (ICD10-I70.0). Electronically Signed   By: Abigail Miyamoto M.D.   On: 12/19/2018 16:40   Nm Pulmonary Perfusion  Result Date: 12/19/2018 CLINICAL DATA:  Heart failure, lower extremity edema, elevated BNP EXAM: NUCLEAR MEDICINE PERFUSION LUNG SCAN  TECHNIQUE: Perfusion images were obtained in multiple projections after intravenous injection of radiopharmaceutical. Ventilation scans intentionally deferred if perfusion scan and chest x-ray adequate for interpretation during COVID 19 epidemic. RADIOPHARMACEUTICALS:  1.45 mCi Tc-34m MAA IV COMPARISON:  Correlation with chest radiographs dated 12/19/2018 FINDINGS: No wedge-shaped perfusion defects to suggest pulmonary embolism. Ventilation imaging was deferred. When correlating with chest radiographs, there is a small left pleural effusion. IMPRESSION: Negative for pulmonary embolism. Electronically Signed   By: Julian Hy M.D.   On: 12/19/2018 18:13      Medications:     Scheduled Medications: . aspirin EC  81 mg Oral Daily  . atorvastatin  40 mg Oral q1800  . bisacodyl  10 mg Oral Daily   Or  . bisacodyl  10 mg Rectal Daily  . carvedilol  3.125 mg Oral BID WC  . Chlorhexidine Gluconate Cloth  6 each Topical Daily  . clopidogrel  75 mg Oral Daily  . colchicine  0.6 mg Oral Daily  . digoxin  0.125 mg Oral Daily  . docusate sodium  200 mg Oral Daily  . ferrous [id number] C-folic acid  1 capsule Oral BID PC  . furosemide  40 mg Oral Daily  . multivitamins with iron  1 tablet Oral Daily  . pantoprazole  20 mg Oral Daily  . potassium chloride  20 mEq Oral Daily  . spironolactone  12.5 mg Oral Daily     Infusions:   PRN Medications:  ondansetron (ZOFRAN) IV, oxyCODONE, promethazine, sodium chloride flush, traMADol, white petrolatum   Assessment/Plan   1. CAD: S/p CABG.   - Continue ASA 81 + Plavix.  - Continue statin.  2. Acute on chronic systolic CHF: Ischemic cardiomyopathy.  Post-op echo was reviewed, showed EF 30-35%, moderate-severe RV dysfunction.  RV function worse post-op, V/Q scan not suggestive of PE.  He has been on midodrine for low BP but SBP currently in the 100-110 range. Volume status looks ok today.  - Continue Lasix 40 mg daily.  - Midodrine  decreased yesterday, will try to stop today. Wear ted hose.  - Add spironolactone 12.5 daily.  - Continue digoxin 0.125 daily.  - Continue Coreg 3.125 mg bid.  3. Anemia: Post-op blood loss.   Ready for CIR when bed available.   Length of Stay: 8  [name], MD  12/20/2018, 8:01 AM  Advanced Heart Failure Team Pager [phone] (M-F; 7a - 4p)  Please contact [facility] for night-coverage after hours (4p -7a ) and weekends on [email or web address]

## 2018-12-21 ENCOUNTER — Inpatient Hospital Stay (HOSPITAL_COMMUNITY): Payer: Self-pay

## 2018-12-21 ENCOUNTER — Encounter (HOSPITAL_COMMUNITY): Payer: Self-pay | Admitting: *Deleted

## 2018-12-21 ENCOUNTER — Inpatient Hospital Stay (HOSPITAL_COMMUNITY)
Admission: RE | Admit: 2018-12-21 | Discharge: 2018-12-28 | DRG: 945 | Disposition: A | Discharge status: Home Health | Payer: Self-pay | Source: Intra-hospital | Attending: Physical Medicine and Rehabilitation | Admitting: Physical Medicine and Rehabilitation

## 2018-12-21 ENCOUNTER — Other Ambulatory Visit: Payer: Self-pay

## 2018-12-21 DIAGNOSIS — I5022 Chronic systolic (congestive) heart failure: Secondary | ICD-10-CM | POA: Diagnosis present

## 2018-12-21 DIAGNOSIS — R5381 Other malaise: Principal | ICD-10-CM | POA: Diagnosis present

## 2018-12-21 DIAGNOSIS — R7989 Other specified abnormal findings of blood chemistry: Secondary | ICD-10-CM

## 2018-12-21 DIAGNOSIS — I110 Hypertensive heart disease with heart failure: Secondary | ICD-10-CM | POA: Diagnosis present

## 2018-12-21 DIAGNOSIS — Z79899 Other long term (current) drug therapy: Secondary | ICD-10-CM

## 2018-12-21 DIAGNOSIS — Z951 Presence of aortocoronary bypass graft: Secondary | ICD-10-CM

## 2018-12-21 DIAGNOSIS — I5023 Acute on chronic systolic (congestive) heart failure: Secondary | ICD-10-CM

## 2018-12-21 DIAGNOSIS — I5043 Acute on chronic combined systolic (congestive) and diastolic (congestive) heart failure: Secondary | ICD-10-CM

## 2018-12-21 DIAGNOSIS — Z833 Family history of diabetes mellitus: Secondary | ICD-10-CM

## 2018-12-21 DIAGNOSIS — D62 Acute posthemorrhagic anemia: Secondary | ICD-10-CM

## 2018-12-21 DIAGNOSIS — M1611 Unilateral primary osteoarthritis, right hip: Secondary | ICD-10-CM | POA: Diagnosis present

## 2018-12-21 DIAGNOSIS — H539 Unspecified visual disturbance: Secondary | ICD-10-CM | POA: Diagnosis present

## 2018-12-21 DIAGNOSIS — I255 Ischemic cardiomyopathy: Secondary | ICD-10-CM | POA: Diagnosis present

## 2018-12-21 DIAGNOSIS — Z8249 Family history of ischemic heart disease and other diseases of the circulatory system: Secondary | ICD-10-CM

## 2018-12-21 DIAGNOSIS — R0602 Shortness of breath: Secondary | ICD-10-CM

## 2018-12-21 DIAGNOSIS — I2510 Atherosclerotic heart disease of native coronary artery without angina pectoris: Secondary | ICD-10-CM | POA: Diagnosis present

## 2018-12-21 DIAGNOSIS — G8929 Other chronic pain: Secondary | ICD-10-CM | POA: Diagnosis present

## 2018-12-21 DIAGNOSIS — D696 Thrombocytopenia, unspecified: Secondary | ICD-10-CM | POA: Diagnosis present

## 2018-12-21 LAB — BASIC METABOLIC PANEL
Anion gap: 11 (ref 5–15)
BUN: 28 mg/dL — ABNORMAL HIGH (ref 8–23)
CO2: 28 mmol/L (ref 22–32)
Calcium: 8.1 mg/dL — ABNORMAL LOW (ref 8.9–10.3)
Chloride: 105 mmol/L (ref 98–111)
Creatinine, Ser: 1.06 mg/dL (ref 0.61–1.24)
GFR calc Af Amer: >60 mL/min (ref >60)
GFR calc non Af Amer: >60 mL/min (ref >60)
Glucose, Bld: 103 mg/dL — ABNORMAL HIGH (ref 70–99)
Potassium: 3.7 mmol/L (ref 3.5–5.1)
Sodium: 144 mmol/L (ref 135–145)

## 2018-12-21 MED ORDER — ATORVASTATIN CALCIUM 40 MG PO TABS
40.0000 mg | ORAL_TABLET | Freq: Every day | ORAL | Status: DC
  Administered 2018-12-21 – 2018-12-27 (×7): 40 mg via ORAL
  Filled 2018-12-21 (×7): qty 1

## 2018-12-21 MED ORDER — COLCHICINE 0.6 MG PO TABS
0.6000 mg | ORAL_TABLET | Freq: Every day | ORAL | Status: DC
  Administered 2018-12-22 – 2018-12-28 (×7): 0.6 mg via ORAL
  Filled 2018-12-21 (×7): qty 1

## 2018-12-21 MED ORDER — CARVEDILOL 3.125 MG PO TABS
3.1250 mg | ORAL_TABLET | Freq: Two times a day (BID) | ORAL | Status: DC
  Administered 2018-12-21 – 2018-12-28 (×13): 3.125 mg via ORAL
  Filled 2018-12-21 (×14): qty 1

## 2018-12-21 MED ORDER — TRAZODONE HCL 50 MG PO TABS
25.0000 mg | ORAL_TABLET | Freq: Every evening | ORAL | Status: DC | PRN
  Administered 2018-12-21 – 2018-12-27 (×5): 50 mg via ORAL
  Filled 2018-12-21 (×5): qty 1

## 2018-12-21 MED ORDER — POTASSIUM CHLORIDE CRYS ER 20 MEQ PO TBCR
20.0000 meq | EXTENDED_RELEASE_TABLET | Freq: Every day | ORAL | Status: DC
  Administered 2018-12-22 – 2018-12-28 (×7): 20 meq via ORAL
  Filled 2018-12-21 (×7): qty 1

## 2018-12-21 MED ORDER — PROCHLORPERAZINE 25 MG RE SUPP: 12.5000 mg | Freq: Four times a day (QID) | RECTAL | Status: DC | PRN

## 2018-12-21 MED ORDER — ALUM & MAG HYDROXIDE-SIMETH 200-200-20 MG/5ML PO SUSP
30.0000 mL | Freq: Once | ORAL | Status: AC
  Administered 2018-12-21: 16:00:00 30 mL via ORAL

## 2018-12-21 MED ORDER — SPIRONOLACTONE 25 MG PO TABS
25.0000 mg | ORAL_TABLET | Freq: Every day | ORAL | Status: DC
  Administered 2018-12-22 – 2018-12-28 (×7): 25 mg via ORAL
  Filled 2018-12-21 (×7): qty 1

## 2018-12-21 MED ORDER — SPIRONOLACTONE 25 MG PO TABS
25.0000 mg | ORAL_TABLET | Freq: Every day | ORAL | Status: DC
  Administered 2018-12-21: 25 mg via ORAL
  Filled 2018-12-21: qty 1

## 2018-12-21 MED ORDER — TRAMADOL HCL 50 MG PO TABS: 50.0000 mg | ORAL_TABLET | ORAL | Status: DC | PRN

## 2018-12-21 MED ORDER — SPIRONOLACTONE 25 MG PO TABS: 25.0000 mg | ORAL_TABLET | Freq: Every day | ORAL | Status: DC

## 2018-12-21 MED ORDER — SENNOSIDES-DOCUSATE SODIUM 8.6-50 MG PO TABS
2.0000 | ORAL_TABLET | Freq: Every day | ORAL | Status: DC
  Administered 2018-12-21 – 2018-12-27 (×4): 2 via ORAL
  Filled 2018-12-21 (×7): qty 2

## 2018-12-21 MED ORDER — SPIRONOLACTONE 25 MG PO TABS: 12.5000 mg | ORAL_TABLET | Freq: Every day | ORAL | Status: DC

## 2018-12-21 MED ORDER — NITROGLYCERIN 0.4 MG SL SUBL: 0.4000 mg | SUBLINGUAL_TABLET | SUBLINGUAL | Status: DC | PRN

## 2018-12-21 MED ORDER — ACETAMINOPHEN 325 MG PO TABS: 325.0000 mg | ORAL_TABLET | ORAL | Status: DC | PRN

## 2018-12-21 MED ORDER — ALUM & MAG HYDROXIDE-SIMETH 200-200-20 MG/5ML PO SUSP
30.0000 mL | ORAL | Status: DC | PRN
  Administered 2018-12-22: 11:00:00 30 mL via ORAL
  Filled 2018-12-21 (×2): qty 30

## 2018-12-21 MED ORDER — WHITE PETROLATUM EX OINT: TOPICAL_OINTMENT | CUTANEOUS | Status: DC | PRN

## 2018-12-21 MED ORDER — ONDANSETRON 4 MG PO TBDP: 4.0000 mg | ORAL_TABLET | Freq: Four times a day (QID) | ORAL | Status: DC | PRN

## 2018-12-21 MED ORDER — FUROSEMIDE 40 MG PO TABS
40.0000 mg | ORAL_TABLET | Freq: Every day | ORAL | Status: DC
  Administered 2018-12-22 – 2018-12-27 (×6): 40 mg via ORAL
  Filled 2018-12-21 (×7): qty 1

## 2018-12-21 MED ORDER — DIPHENHYDRAMINE HCL 12.5 MG/5ML PO ELIX: 12.5000 mg | ORAL_SOLUTION | Freq: Four times a day (QID) | ORAL | Status: DC | PRN

## 2018-12-21 MED ORDER — FE FUMARATE-B12-VIT C-FA-IFC PO CAPS
1.0000 | ORAL_CAPSULE | Freq: Two times a day (BID) | ORAL | Status: DC
  Administered 2018-12-21 – 2018-12-28 (×14): 1 via ORAL
  Filled 2018-12-21 (×16): qty 1

## 2018-12-21 MED ORDER — POLYETHYLENE GLYCOL 3350 17 G PO PACK: 17.0000 g | PACK | Freq: Every day | ORAL | Status: DC | PRN

## 2018-12-21 MED ORDER — CLOPIDOGREL BISULFATE 75 MG PO TABS
75.0000 mg | ORAL_TABLET | Freq: Every day | ORAL | Status: DC
  Administered 2018-12-22 – 2018-12-28 (×7): 75 mg via ORAL
  Filled 2018-12-21 (×7): qty 1

## 2018-12-21 MED ORDER — POTASSIUM CHLORIDE CRYS ER 20 MEQ PO TBCR
30.0000 meq | EXTENDED_RELEASE_TABLET | Freq: Once | ORAL | Status: AC
  Administered 2018-12-21: 30 meq via ORAL
  Filled 2018-12-21: qty 1

## 2018-12-21 MED ORDER — BLOOD PRESSURE CONTROL BOOK
Freq: Once | Status: AC
  Administered 2018-12-21: 20:00:00
  Filled 2018-12-21: qty 1

## 2018-12-21 MED ORDER — PROCHLORPERAZINE MALEATE 5 MG PO TABS
5.0000 mg | ORAL_TABLET | Freq: Four times a day (QID) | ORAL | Status: DC | PRN
  Administered 2018-12-22 – 2018-12-24 (×4): 10 mg via ORAL
  Administered 2018-12-25: 5 mg via ORAL
  Administered 2018-12-27 – 2018-12-28 (×2): 10 mg via ORAL
  Filled 2018-12-21: qty 2
  Filled 2018-12-21: qty 1
  Filled 2018-12-21 (×5): qty 2

## 2018-12-21 MED ORDER — ONDANSETRON 4 MG PO TBDP: 4.0000 mg | ORAL_TABLET | Freq: Four times a day (QID) | ORAL | 0 refills | Status: DC | PRN

## 2018-12-21 MED ORDER — DIGOXIN 125 MCG PO TABS
0.1250 mg | ORAL_TABLET | Freq: Every day | ORAL | Status: DC
  Administered 2018-12-22 – 2018-12-28 (×7): 0.125 mg via ORAL
  Filled 2018-12-21 (×7): qty 1

## 2018-12-21 MED ORDER — PANTOPRAZOLE SODIUM 20 MG PO TBEC
20.0000 mg | DELAYED_RELEASE_TABLET | Freq: Every day | ORAL | Status: DC
  Administered 2018-12-22 – 2018-12-28 (×7): 20 mg via ORAL
  Filled 2018-12-21 (×7): qty 1

## 2018-12-21 MED ORDER — LIVING BETTER WITH HEART FAILURE BOOK
Freq: Once | Status: AC
  Administered 2018-12-21: 20:00:00

## 2018-12-21 MED ORDER — PROCHLORPERAZINE EDISYLATE 10 MG/2ML IJ SOLN: 5.0000 mg | Freq: Four times a day (QID) | INTRAMUSCULAR | Status: DC | PRN

## 2018-12-21 MED ORDER — PROMETHAZINE HCL 12.5 MG PO TABS
6.2500 mg | ORAL_TABLET | Freq: Four times a day (QID) | ORAL | Status: DC | PRN
  Filled 2018-12-21: qty 1

## 2018-12-21 MED ORDER — ASPIRIN EC 81 MG PO TBEC
81.0000 mg | DELAYED_RELEASE_TABLET | Freq: Every day | ORAL | Status: DC
  Administered 2018-12-22 – 2018-12-28 (×7): 81 mg via ORAL
  Filled 2018-12-21 (×6): qty 1

## 2018-12-21 MED ORDER — OXYCODONE HCL 5 MG PO TABS
5.0000 mg | ORAL_TABLET | ORAL | Status: DC | PRN
  Administered 2018-12-25: 5 mg via ORAL
  Filled 2018-12-21: qty 1

## 2018-12-21 MED ORDER — PROMETHAZINE HCL 12.5 MG PO TABS: 6.2500 mg | ORAL_TABLET | Freq: Four times a day (QID) | ORAL | 0 refills | Status: DC | PRN

## 2018-12-21 MED ORDER — FLEET ENEMA 7-19 GM/118ML RE ENEM: 1.0000 | ENEMA | Freq: Once | RECTAL | Status: DC | PRN

## 2018-12-21 MED ORDER — GUAIFENESIN-DM 100-10 MG/5ML PO SYRP: 5.0000 mL | ORAL_SOLUTION | Freq: Four times a day (QID) | ORAL | Status: DC | PRN

## 2018-12-21 MED ORDER — TAB-A-VITE/IRON PO TABS
1.0000 | ORAL_TABLET | Freq: Every day | ORAL | Status: DC
  Administered 2018-12-22 – 2018-12-28 (×7): 1 via ORAL
  Filled 2018-12-21 (×7): qty 1

## 2018-12-21 MED ORDER — BISACODYL 10 MG RE SUPP: 10.0000 mg | Freq: Every day | RECTAL | Status: DC | PRN

## 2018-12-21 MED ORDER — DIGOXIN 125 MCG PO TABS: 0.1250 mg | ORAL_TABLET | Freq: Every day | ORAL | Status: DC

## 2018-12-21 MED ORDER — SPIRONOLACTONE 25 MG PO TABS: 25.0000 mg | ORAL_TABLET | Freq: Every day | ORAL | 3 refills | Status: DC

## 2018-12-21 MED FILL — Heparin Sodium (Porcine) Inj 1000 Unit/ML: INTRAMUSCULAR | Qty: 10 | Status: AC

## 2018-12-21 MED FILL — Electrolyte-R (PH 7.4) Solution: INTRAVENOUS | Qty: 3000 | Status: AC

## 2018-12-21 MED FILL — Mannitol IV Soln 20%: INTRAVENOUS | Qty: 500 | Status: AC

## 2018-12-21 MED FILL — Sodium Chloride IV Soln 0.9%: INTRAVENOUS | Qty: 2000 | Status: AC

## 2018-12-21 MED FILL — Sodium Bicarbonate IV Soln 8.4%: INTRAVENOUS | Qty: 50 | Status: AC

## 2018-12-21 MED FILL — Lidocaine HCl Local Soln Prefilled Syringe 100 MG/5ML (2%): INTRAMUSCULAR | Qty: 5 | Status: AC

## 2018-12-21 MED FILL — Electrolyte-R (PH 7.4) Solution: INTRAVENOUS | Qty: 4000 | Status: AC

## 2018-12-21 MED FILL — Sodium Chloride IV Soln 0.9%: INTRAVENOUS | Qty: 3000 | Status: AC

## 2018-12-21 MED FILL — Calcium Chloride Inj 10%: INTRAVENOUS | Qty: 10 | Status: AC

## 2018-12-21 MED FILL — Heparin Sodium (Porcine) Inj 1000 Unit/ML: INTRAMUSCULAR | Qty: 30 | Status: AC

## 2018-12-21 NOTE — Progress Notes (Signed)
Physical Therapy Treatment Patient Details Name: Frank Love MRN: [id number] DOB: [birth year] Today's Date: 12/21/2018    History of Present Illness Pt adm for CABG x 4 on 11/18. Underwent urgent mediastinal exploration same day and found small bleed from one graft which was repaired with 1 suture. PMH - severe rt hip arthritis (was scheduled for THR), CAD, HTN    PT Comments    Patient received in bed, very pleasant and cooperative with PT and motivated to continue to walk. Reviewed sternal precautions, then performed bed mobility with min guard-MinAx2, functional transfers with MinAx2, and gait approximately 56f with RW and min guard x2 for safety with gait and balance impairments as listed below. Tends to place R foot outside of RW to assist with offloading due to pain, however this results in an odd shuffling gait pattern with RW. Mobility and balance much improved overall this session, patient excited about progress and motivated to continue rehab with CIR. Continues to require assist of +2 for safety. He was left up in the chair eating lunch with all needs met this afternoon.    Follow Up Recommendations  CIR;Supervision/Assistance - 24 hour     Equipment Recommendations  Other (comment)(defer)    Recommendations for Other Services       Precautions / Restrictions Precautions Precautions: Fall;Sternal Precaution Comments: right hip DJD Restrictions Weight Bearing Restrictions: Yes RLE Weight Bearing: Weight bearing as tolerated Other Position/Activity Restrictions: sternal precautions     Mobility  Bed Mobility Overal bed mobility: Needs Assistance Bed Mobility: Rolling;Sidelying to Sit Rolling: Min guard Sidelying to sit: Min assist;+2 for physical assistance       General bed mobility comments: increased time and effort but improving overall  Transfers Overall transfer level: Needs assistance Equipment used: Rolling walker (2 wheeled) Transfers: Sit  to/from Stand Sit to Stand: Min assist;+2 physical assistance Stand pivot transfers: Min guard;+2 physical assistance       General transfer comment: MinA and rocking with sternal heart pillow to stand, light boost but overall much improved; able to transfer with min guardx2 with RW  Ambulation/Gait Ambulation/Gait assistance: Min guard Gait Distance (Feet): 24 Feet Assistive device: Rolling walker (2 wheeled) Gait Pattern/deviations: Wide base of support;Step-to pattern;Trunk flexed;Antalgic;Shuffle Gait velocity: decreased   General Gait Details: antalgic gait with wide BOS due to R hip pain, cues to remain inside RW, VSS   Stairs             Wheelchair Mobility    Modified Rankin (Stroke Patients Only)       Balance Overall balance assessment: Needs assistance Sitting-balance support: Feet supported;No upper extremity supported Sitting balance-Leahy Scale: Good Sitting balance - Comments: able to maintain static sitting at EOB with S, improved balance today   Standing balance support: Bilateral upper extremity supported;During functional activity Standing balance-Leahy Scale: Poor Standing balance comment: Requires UE support in standing                            Cognition Arousal/Alertness: Awake/alert Behavior During Therapy: WFL for tasks assessed/performed Overall Cognitive Status: Within Functional Limits for tasks assessed                                 General Comments: very pleasant and cooperative      Exercises      General Comments General comments (skin integrity, edema, etc.): VSS  Pertinent Vitals/Pain Pain Assessment: No/denies pain Pain Score: 0-No pain Pain Intervention(s): Limited activity within patient's tolerance;Monitored during session    Home Living                      Prior Function            PT Goals (current goals can now be found in the care plan section) Acute Rehab PT  Goals Patient Stated Goal: to get over this so I can have my hip surgery PT Goal Formulation: With patient/family Time For Goal Achievement: 12/27/18 Potential to Achieve Goals: Good Progress towards PT goals: Progressing toward goals    Frequency    Min 3X/week      PT Plan Current plan remains appropriate    Co-evaluation              AM-PAC PT "6 Clicks" Mobility   Outcome Measure  Help needed turning from your back to your side while in a flat bed without using bedrails?: A Little Help needed moving from lying on your back to sitting on the side of a flat bed without using bedrails?: A Little Help needed moving to and from a bed to a chair (including a wheelchair)?: A Little Help needed standing up from a chair using your arms (e.g., wheelchair or bedside chair)?: A Little Help needed to walk in hospital room?: A Little Help needed climbing 3-5 steps with a railing? : A Lot 6 Click Score: 17    End of Session Equipment Utilized During Treatment: Gait belt Activity Tolerance: Patient tolerated treatment well Patient left: in chair;with call bell/phone within reach   PT Visit Diagnosis: Other abnormalities of gait and mobility (R26.89);Muscle weakness (generalized) (M62.81);Difficulty in walking, not elsewhere classified (R26.2);Pain Pain - Right/Left: Right Pain - part of body: (sternum)     Time: 8776-5486 PT Time Calculation (min) (ACUTE ONLY): 18 min  Charges:  $Gait Training: 8-22 mins                     Windell Norfolk, DPT, PN1   Supplemental Physical Therapist [place]    Pager [phone] Acute Rehab Office [phone]

## 2018-12-21 NOTE — Progress Notes (Signed)
Meredith Staggers, MD  Physician  Physical Medicine and Rehabilitation  PMR Pre-admission  Signed  Date of Service:  12/17/2018 5:06 PM      Related encounter: Admission (Discharged) from 12/12/2018 in [facility] 4E CV [place]      Signed         PMR Admission Coordinator Pre-Admission Assessment  Patient: Frank Love is an 63 y.o., male MRN: [id number] DOB: Jul 12, 1955 Height: 6' 3"  (190.5 cm) Weight: 105.2 kg  Insurance Information HMO:     PPO:      PCP:      IPA:     80/20:      OTHER:  PRIMARY: Pt is uninsured (self pay)      Policy#:       Subscriber:  CM Name:       Phone#:      Fax#:  Pre-Cert#:      Employer:  Benefits:  Phone #:      Name:  Eff. Date:      Deduct:       Out of Pocket Max:      Life Max:  CIR: Pt is aware of estimated daily cost of care for IP Rehab ($3,500). [Per Financial Counselor, Toniann Ket, this patient has been approved for financial assistance of 75% off his [facility] bills; Per Jenny Reichmann this would include CIR] SNF: Outpatient:      Co-Pay:  Home Health:       Co-Pay:  DME:      Co-Pay:  Providers:   SECONDARY:      Policy#:       Subscriber:  CM Name:       Phone#:      Fax#: Pre-Cert#:       Employer:  Benefits:  Phone #:      Name:  Eff. Date:      Deduct:       Out of Pocket Max      Life Max:  CIR:       SNF:  Outpatient:      Co-Pay:  Home Health:       Co-Pay:  DME:     Co-Pay:   Medicaid Application Date: Retro/ongoing MAD app pending. Date of app 88/04/1658  Disability Application Date:       Case Worker: N. Whitsett ([email or web address])  The "Data Collection Information Summary" for patients in Inpatient Rehabilitation Facilities with attached "Privacy Act [place] Records" was provided and verbally reviewed with: N/A  Emergency Contact Information         Contact Information    Name Relation Home Work [place] Spouse   [place] Daughter   [phone]      Current Medical History  Patient Admitting Diagnosis: Mediastinal re-exploration after bypass graft  History of Present Illness: Frank Love is a 63 year old male with history of HTN, CAD with systolic CHF who was admitted on 12/12/18 for CABG X 4 by Dr.Adkins. Post op had excessive output from chest tube requiring FFP, cryoprecipitate and 2 units PRBC. He was taken back to OR later that evening for mediastinal reexploration. He has had issues with fluid overload requiring diuresis with dopamine as well as neosynephrine due to hypotension. Midodrine added for BP support and dopamine discontinued 11/24. Follow up echo 11/23 showed EF 30-35% with global hypokinesis and LV thrombus excluded with contrast.Pt has progressed to [facility] since surgery and, per cardiology, may eventually need RHC. IV  lasix was switched to PO. Pt has been evaluated by therapies with recommendation for CIR. Pt is to admit to CIR on 12/21/2018.   Patient's medical record from [facility] has been reviewed by the rehabilitation admission coordinator and physician.  Past Medical History      Past Medical History:  Diagnosis Date  . Chronic pain of right knee   . Coronary artery disease   . Hypertension   . Myocardial infarction [facility])    possible in the past was told by a doctor but no one has ever said anything else  . Phimosis     Family History   family history includes Diabetes in his mother; Heart disease in his mother.  Prior Rehab/Hospitalizations Has the patient had prior rehab or hospitalizations prior to admission? Yes  Has the patient had major surgery during 100 days prior to admission? Yes             Current Medications  Current Facility-Administered Medications:  .  aspirin EC tablet 81 mg, 81 mg, Oral, Daily, Atkins, Glenice Bow, MD, 81 mg at 12/21/18 1048 .  atorvastatin (LIPITOR) tablet 40 mg, 40 mg,  Oral, q1800, Nani Skillern, PA-C, 40 mg at 12/20/18 1658 .  bisacodyl (DULCOLAX) EC tablet 10 mg, 10 mg, Oral, Daily, 10 mg at 12/20/18 0848 **OR** bisacodyl (DULCOLAX) suppository 10 mg, 10 mg, Rectal, Daily, Atkins, Broadus Z, MD .  carvedilol (COREG) tablet 3.125 mg, 3.125 mg, Oral, BID WC, Atkins, Glenice Bow, MD, 3.125 mg at 12/21/18 0821 .  Chlorhexidine Gluconate Cloth 2 % PADS 6 each, 6 each, Topical, Daily, [name], MD, 6 each at 12/18/18 2023 .  clopidogrel (PLAVIX) tablet 75 mg, 75 mg, Oral, Daily, Atkins, Glenice Bow, MD, 75 mg at 12/21/18 1047 .  colchicine tablet 0.6 mg, 0.6 mg, Oral, Daily, Atkins, Glenice Bow, MD, 0.6 mg at 12/21/18 1047 .  digoxin (LANOXIN) tablet 0.125 mg, 0.125 mg, Oral, Daily, Clegg, Amy D, NP, 0.125 mg at 12/21/18 1048 .  docusate sodium (COLACE) capsule 200 mg, 200 mg, Oral, Daily, Orvan Seen, Glenice Bow, MD, 200 mg at 12/19/18 0835 .  ferrous [id number] C-folic acid (TRINSICON / FOLTRIN) capsule 1 capsule, 1 capsule, Oral, BID PC, Atkins, Glenice Bow, MD, 1 capsule at 12/21/18 0821 .  furosemide (LASIX) tablet 40 mg, 40 mg, Oral, Daily, Clegg, Amy D, NP, 40 mg at 12/21/18 1050 .  multivitamins with iron tablet 1 tablet, 1 tablet, Oral, Daily, Orvan Seen, Glenice Bow, MD, 1 tablet at 12/21/18 1048 .  ondansetron (ZOFRAN) injection 4 mg, 4 mg, Intravenous, Q6H PRN, Orvan Seen, Glenice Bow, MD, 4 mg at 12/18/18 1344 .  oxyCODONE (Oxy IR/ROXICODONE) immediate release tablet 5 mg, 5 mg, Oral, Q4H PRN, [name], MD, 5 mg at 12/19/18 0417 .  pantoprazole (PROTONIX) EC tablet 20 mg, 20 mg, Oral, Daily, Lars Pinks M, PA-C, 20 mg at 12/21/18 1047 .  potassium chloride SA (KLOR-CON) CR tablet 20 mEq, 20 mEq, Oral, Daily, Lars Pinks M, PA-C, 20 mEq at 12/21/18 1051 .  promethazine (PHENERGAN) injection 6.25 mg, 6.25 mg, Intravenous, Q6H PRN, [name], MD, 6.25 mg at 12/18/18 1553 .  sodium chloride flush (NS) 0.9 % injection 3 mL, 3  mL, Intravenous, PRN, Atkins, Broadus Z, MD .  spironolactone (ALDACTONE) tablet 25 mg, 25 mg, Oral, Daily, Larey Dresser, MD, 25 mg at 12/21/18 1047 .  traMADol (ULTRAM) tablet 50 mg, 50 mg, Oral, Q4H PRN, Atkins, Broadus Z,  MD, 50 mg at 12/17/18 1506 .  white petrolatum (VASELINE) gel, , Topical, PRN, Orvan Seen, Glenice Bow, MD  Patients Current Diet:     Diet Order                  Diet heart healthy/carb modified Room service appropriate? Yes; Fluid consistency: Thin  Diet effective now               Precautions / Restrictions Precautions Precautions: Fall, Sternal Precaution Booklet Issued: Yes (comment) Precaution Comments: right hip DJD Restrictions Weight Bearing Restrictions: Yes RLE Weight Bearing: Weight bearing as tolerated Other Position/Activity Restrictions: sternal precautions    Has the patient had 2 or more falls or a fall with injury in the past year? 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 Pt Independent with use of straight cane (does have Right hip pain at baseline)  Prior Functional Level Self Care: Did the patient need help bathing, dressing, using the toilet or eating? Independent  Indoor Mobility: Did the patient need assistance with walking from room to room (with or without device)? Independent  Stairs: Did the patient need assistance with internal or external stairs (with or without device)? Independent  Functional Cognition: Did the patient need help planning regular tasks such as shopping or remembering to take medications? [place] / [place] Devices/Equipment: Cane (specify quad or straight), Eyeglasses Home Equipment: Walker - 2 wheels, Cane - single point  Prior Device Use: Indicate devices/aids used by the patient prior to current illness, exacerbation or injury? single point cane  Current Functional Level  Cognition  Overall Cognitive Status: Within Functional Limits for tasks assessed Orientation Level: Oriented X4    Extremity Assessment (includes Sensation/Coordination)  Upper Extremity Assessment: Generalized weakness  Lower Extremity Assessment: Generalized weakness, RLE deficits/detail RLE Deficits / Details: Limited strength and ROM due to rt hip pain    ADLs  Overall ADL's : Needs assistance/impaired Eating/Feeding: Set up, Sitting Grooming: Set up, Sitting, Oral care Grooming Details (indicate cue type and reason): seated in recliner  Upper Body Bathing: Moderate assistance, Sitting Lower Body Bathing: Total assistance Lower Body Bathing Details (indicate cue type and reason): Mod A +2 sit<>stand from recliner with momentum Upper Body Dressing : Maximal assistance, Sitting Lower Body Dressing: Total assistance, Sit to/from stand Lower Body Dressing Details (indicate cue type and reason): requires assist to adjust socks, mod-max assist sit to stand  Toilet Transfer: Moderate assistance, Maximal assistance, Stand-pivot, RW Toilet Transfer Details (indicate cue type and reason): simulated to recliner Toileting- Clothing Manipulation and Hygiene: Total assistance Toileting - Clothing Manipulation Details (indicate cue type and reason): Mod A +2 sit<>stand from recliner with momentum Functional mobility during ADLs: Moderate assistance, Maximal assistance, Rolling walker, Cueing for safety, Cueing for sequencing General ADL Comments: provided handout for sternal precautions for pt to review, verbally educated precautions but required cueing to adhere functioanlly    Mobility  Overal bed mobility: Needs Assistance Bed Mobility: Supine to Sit Supine to sit: Mod assist, HOB elevated Sit to sidelying: Mod assist, HOB elevated General bed mobility comments: pt able to bring legs off bed, mod assist to ascend trunk; increased time and effort with cueing for technique     Transfers  Overall transfer level: Needs assistance Equipment used: Rolling walker (2 wheeled) Transfers: Sit to/from Stand Sit to Stand: Mod assist, Max assist, From elevated surface Stand pivot transfers: Min assist General transfer comment:  from elevated EOB mod assist, from recliner max assist using momentum holding onto heart pillow to ascend into standing, with support to power up and steady; small shuffling steps to recliner givne min assist    Ambulation / Gait / Stairs / Wheelchair Mobility  Ambulation/Gait Ambulation/Gait assistance: Min assist, +2 safety/equipment Gait Distance (Feet): 26 Feet(+30') Assistive device: Rolling walker (2 wheeled) Gait Pattern/deviations: Wide base of support, Step-to pattern, Trunk flexed, Antalgic, Shuffle General Gait Details: Slow, unsteady and antalgic like gait with short step lengths bilaterally. Wide BoS. 1 seated rest break. VSS. Gait velocity: decreased    Posture / Balance Dynamic Sitting Balance Sitting balance - Comments: UE support and min guard for static sitting EOB. Balance Overall balance assessment: Needs assistance Sitting-balance support: Feet supported, No upper extremity supported Sitting balance-Leahy Scale: Fair Sitting balance - Comments: UE support and min guard for static sitting EOB. Standing balance support: Bilateral upper extremity supported, During functional activity Standing balance-Leahy Scale: Poor Standing balance comment: Requires UE support in standing    Special needs/care consideration BiPAP/CPAP : no CPM : no Continuous Drip IV : no Dialysis : no        Days : no Life Vest : no Oxygen : no Special Bed : no Trach Size : no Wound Vac (area) : no      Location : no Skin: right and left lip ecchymosis, chest incision, right leg incision                    Bowel mgmt: last BM: 12/20/2018; continent Bladder mgmt: continent  Diabetic mgmt: no Behavioral consideration : no Chemo/radiation : no    Previous Home Environment (from acute therapy documentation) Living Arrangements: Spouse/significant other Available Help at Discharge: Family, Available 24 hours/day Type of Home: Apartment Home Layout: One level Home Access: Stairs to enter Entrance Stairs-Rails: Right Entrance Stairs-Number of Steps: 3+5 Bathroom Shower/Tub: Chiropodist: Standard Home Care Services: No Additional Comments: Pt sleeps in armchair with feet on ottoman  Discharge Living Setting Plans for Discharge Living Setting: Patient's home, Lives with (comment)(wife) Type of Home at Discharge: Apartment Discharge Home Layout: One level Discharge Home Access: Stairs to enter Entrance Stairs-Rails: Right Entrance Stairs-Number of Steps: 1 (no rails), then 4 then 5 Discharge Bathroom Shower/Tub: Tub/shower unit Discharge Bathroom Toilet: Standard Discharge Bathroom Accessibility: Yes How Accessible: Accessible via walker Does the patient have any problems obtaining your medications?: No  Social/Family/Support Systems Patient Roles: Spouse Contact Information: [name] (wife): [phone] Anticipated Caregiver: wife Anticipated Caregiver's Contact Information: see above Ability/Limitations of Caregiver: supervision Caregiver Availability: 24/7 Discharge Plan Discussed with Primary Caregiver: Yes Is Caregiver In Agreement with Plan?: Yes Does Caregiver/Family have Issues with Lodging/Transportation while Pt is in Rehab?: No  Goals/Additional Needs Patient/Family Goal for Rehab: PT/OT: Supervision; SLP: NA Expected length of stay: 7-10 days Cultural Considerations: NA Dietary Needs: heart healthy/carb modified; thin liquids  Equipment Needs: TBD Pt/Family Agrees to Admission and willing to participate: Yes Program Orientation Provided & Reviewed with Pt/Caregiver Including Roles  & Responsibilities: Yes(pt and wife)  Barriers to Discharge: Home environment access/layout, Lack  of/limited family support, Insurance for SNF coverage  Barriers to Discharge Comments: steps to enter; wife can only do supervision/light min A. uninsured.   Decrease burden of Care through IP rehab admission: NA  Possible need for SNF placement upon discharge: Not anticipated; pt is uninsured. Pt will have 24/7 Supervision at DC. Anticipate pt can get to supervision level after CIR  stay. Pt and family aware plan is to DC home after CIR.   Patient Condition: I have reviewed medical records from [facility], spoken with PA, and patient and spouse. I met with patient at the bedside for inpatient rehabilitation assessment.  Patient will benefit from ongoing PT and OT, can actively participate in 3 hours of therapy a day 5 days of the week, and can make measurable gains during the admission.  Patient will also benefit from the coordinated team approach during an Inpatient Acute Rehabilitation admission.  The patient will receive intensive therapy as well as Rehabilitation physician, nursing, social worker, and care management interventions.  Due to safety, skin/wound care, disease management, medication administration, pain management and patient education the patient requires 24 hour a day rehabilitation nursing.  The patient is currently Min A + 2 with mobility and set up A to total A for basic ADLs.  Discharge setting and therapy post discharge at home with home health is anticipated.  Patient has agreed to participate in the Acute Inpatient Rehabilitation Program and will admit 12/21/2018.  Preadmission Screen Completed By:  Raechel Ache, 12/21/2018 10:55 AM ______________________________________________________________________   Discussed status with Dr. Naaman Plummer at 10:55AM on 12/21/2018 and received approval for admission today.  Admission Coordinator:  Raechel Ache, OT, time 10:55AM/Date 12/21/2018   Assessment/Plan: Diagnosis: Impaired mobility and ADLs following CABG x4.   1. Does the need for close, 24 hr/day Medical supervision in concert with the patient's rehab needs make it unreasonable for this patient to be served in a less intensive setting? Yes 2. Co-Morbidities requiring supervision/potential complications: anemia, HTN, chronic right knee pain, phimosis 3. Due to bowel management, safety, skin/wound care, disease management, medication administration, pain management and patient education, does the patient require 24 hr/day rehab nursing? Yes 4. Does the patient require coordinated care of a physician, rehab nurse, PT, OT, and SLP to address physical and functional deficits in the context of the above medical diagnosis(es)? Yes Addressing deficits in the following areas: balance, endurance, locomotion, strength, transferring, bowel/bladder control, bathing, dressing, feeding, grooming, toileting and psychosocial support 5. Can the patient actively participate in an intensive therapy program of at least 3 hrs of therapy 5 days a week? Yes 6. The potential for patient to make measurable gains while on inpatient rehab is excellent 7. Anticipated functional outcomes upon discharge from inpatient rehab: modified independent PT, modified independent OT, independent SLP 8. Estimated rehab length of stay to reach the above functional goals is: 7-11 days 9. Anticipated discharge destination: Home 10. Overall Rehab/Functional Prognosis: excellent   MD Signature: Meredith Staggers, MD, [place] [facility] 12/21/2018         Revision History Date/Time User Provider Type Action  12/21/2018 11:09 AM Meredith Staggers, MD Physician Sign  12/21/2018 10:56 AM Raechel Ache, OT Rehab Admission Coordinator Share  12/21/2018 8:59 AM Raechel Ache, OT Rehab Admission Coordinator Share  12/21/2018 8:47 AM Raechel Ache, OT Rehab Admission Coordinator Share  12/18/2018 3:32 PM Raechel Ache, OT Rehab Admission Coordinator Share   12/18/2018 8:56 AM Ranell Patrick, Clide Deutscher, MD Physician Share  12/17/2018 5:10 PM Raechel Ache, [place] Rehab Admission Coordinator [facility] Details Report

## 2018-12-21 NOTE — Progress Notes (Signed)
      [address]       [place] [address]             [phone]        9 Days Post-Op Procedure(s) (LRB): Mediastinal Re-Exploration after bypass graft (N/A)  Subjective: Patient unable to sleep last night.  Objective: Vital signs in last 24 hours: Temp:  [98.4 F (36.9 C)-98.9 F (37.2 C)] 98.9 F (37.2 C) (11/27 0321) Pulse Rate:  [37-77] 72 (11/27 0321) Cardiac Rhythm: Normal sinus rhythm (11/26 1900) Resp:  [16-24] 24 (11/27 0321) BP: (103-117)/(59-93) 110/59 (11/27 0321) SpO2:  [96 %-99 %] 96 % (11/27 0321) Weight:  [105.2 kg] 105.2 kg (11/27 0311)  Pre op weight 105.2 kg Current Weight  12/21/18 105.2 kg      Intake/Output from previous day: 11/26 0701 - 11/27 0700 In: 660 [P.O.:660] Out: 1250 [Urine:1250]   Physical Exam:  Cardiovascular: RRR Pulmonary: Slightly diminished breath sounds on left Abdomen: Soft, non tender, bowel sounds present. Extremities: Trace bilateral lower extremity edema. Wounds: Clean and dry.  No erythema or signs of infection.  Lab Results: CBC: No results for input(s): WBC, HGB, HCT, PLT in the last 72 hours. BMET:  Recent Labs    12/20/18 0846 12/21/18 0301  NA 141 144  K 3.9 3.7  CL 103 105  CO2 29 28  GLUCOSE 130* 103*  BUN 29* 28*  CREATININE 1.17 1.06  CALCIUM 8.4* 8.1*    PT/INR:  Lab Results  Component Value Date   INR 1.8 (H) 12/12/2018   INR 1.5 (H) 12/12/2018   INR 1.0 12/11/2018   ABG:  INR: Will add last result for INR, ABG once components are confirmed Will add last 4 CBG results once components are confirmed  Assessment/Plan:  1. CV - SR with HR in the 60-70's. On Coreg 3.125 mg bid, Digoxin 0.125 mg daily, Spironolactone 12.5 mg daily, Plavix 75 mg daily 2.  Pulmonary - On room air. Encourage incentive spirometer. 3. Volume Overload - Will give 40 mg of oral Lasix 4.  Acute blood loss anemia - Last H and H stable at 7.9 and 24.5. Continue Trinsicon 5. Mild  thrombocytopenia-last platelets up to 122,000 6. Supplement potassium 7. Benadryl PRN sleep 8. Hopefully, to CIR. Will ask nurse in CIR to remove chest tube sutures 11/30 and chest staples on 12/01.  Donielle M ZimmermanPA-C 12/21/2018,7:06 AM

## 2018-12-21 NOTE — Progress Notes (Signed)
Placement of compression stockings delayed by size out of stock.  Continuing to work with materials to fill the order.

## 2018-12-21 NOTE — Discharge Summary (Signed)
      [address]       [place] [address]             [phone]        Please see previously dictated discharge summary.  Frank Love [id number] 21-Feb-1955    Medications have been adjusted since last discharge summary.  He is medically stable for discharge to CIR today.  Update medication list is posted below.  Allergies as of 12/21/2018   No Known Allergies     Medication List    STOP taking these medications   Entresto 24-26 MG Generic drug: sacubitril-valsartan   metoprolol tartrate 25 MG tablet Commonly known as: LOPRESSOR   nitroGLYCERIN 0.4 MG SL tablet Commonly known as: NITROSTAT     TAKE these medications   aspirin EC 81 MG tablet Take 81 mg by mouth at bedtime.   atorvastatin 40 MG tablet Commonly known as: LIPITOR Take 1 tablet (40 mg total) by mouth daily at 6 PM.   carvedilol 3.125 MG tablet Commonly known as: COREG Take 1 tablet (3.125 mg total) by mouth 2 (two) times daily with a meal.   clopidogrel 75 MG tablet Commonly known as: PLAVIX Take 1 tablet (75 mg total) by mouth daily.   colchicine 0.6 MG tablet Take 1 tablet (0.6 mg total) by mouth daily. For one month then stop.   digoxin 0.125 MG tablet Commonly known as: LANOXIN Take 1 tablet (0.125 mg total) by mouth daily.   furosemide 40 MG tablet Commonly known as: LASIX Take 1 tablet (40 mg total) by mouth daily. For 5 days then stop.   oxyCODONE 5 MG immediate release tablet Commonly known as: Oxy IR/ROXICODONE Take 1 tablet (5 mg total) by mouth every 4 (four) hours as needed for severe pain.   potassium chloride SA 20 MEQ tablet Commonly known as: KLOR-CON Take 1 tablet (20 mEq total) by mouth daily. For 5 days then stop.   spironolactone 25 MG tablet Commonly known as: ALDACTONE Take 1 tablet (25 mg total) by mouth daily. Start taking on: December 22, 2018        Ellwood Handler, [place]

## 2018-12-21 NOTE — Progress Notes (Signed)
Patient ID: Frank Love, male   DOB: 1955/08/28, 63 y.o.   MRN: [id number]     Advanced Heart Failure Rounding Note  PCP-Cardiologist: [name], MD   Subjective:    No complaints this morning, SBP 110s.  Denies lightheadedness.  No chest pain.    Objective:   Weight Range: 105.2 kg Body mass index is 28.99 kg/m.   Vital Signs:   Temp:  [98.4 F (36.9 C)-98.9 F (37.2 C)] 98.7 F (37.1 C) (11/27 0758) Pulse Rate:  [37-77] 69 (11/27 0758) Resp:  [16-24] 20 (11/27 0758) BP: (103-117)/(59-93) 113/64 (11/27 0758) SpO2:  [96 %-99 %] 96 % (11/27 0321) Weight:  [105.2 kg] 105.2 kg (11/27 0311) Last BM Date: 12/20/18  Weight change: Filed Weights   12/19/18 0500 12/20/18 0415 12/21/18 0311  Weight: 107 kg 107.9 kg 105.2 kg    Intake/Output:   Intake/Output Summary (Last 24 hours) at 12/21/2018 0946 Last data filed at 12/21/2018 0819 Gross per 24 hour  Intake 420 ml  Output 1150 ml  Net -730 ml      Physical Exam    General: NAD Neck: No JVD, no thyromegaly or thyroid nodule.  Lungs: Clear to auscultation bilaterally with normal respiratory effort. CV: Nondisplaced PMI.  Heart regular S1/S2, no S3/S4, no murmur. 1+ edema to knees.   Abdomen: Soft, nontender, no hepatosplenomegaly, no distention.  Skin: Intact without lesions or rashes.  Neurologic: Alert and oriented x 3.  Psych: Normal affect. Extremities: No clubbing or cyanosis.  HEENT: Normal.    Telemetry   NSR 70s (personally reviewed)  Labs    CBC No results for input(s): WBC, NEUTROABS, HGB, HCT, MCV, PLT in the last 72 hours. Basic Metabolic Panel Recent Labs    12/20/18 0846 12/21/18 0301  NA 141 144  K 3.9 3.7  CL 103 105  CO2 29 28  GLUCOSE 130* 103*  BUN 29* 28*  CREATININE 1.17 1.06  CALCIUM 8.4* 8.1*   Liver Function Tests No results for input(s): AST, ALT, ALKPHOS, BILITOT, PROT, ALBUMIN in the last 72 hours. No results for input(s): LIPASE, AMYLASE in the last 72  hours. Cardiac Enzymes No results for input(s): CKTOTAL, CKMB, CKMBINDEX, TROPONINI in the last 72 hours.  BNP: BNP (last 3 results) Recent Labs    07/06/18 1017 07/09/18 1238 08/03/18 1024  BNP 1,721.2* 1,077.6* 316.2*    ProBNP (last 3 results) No results for input(s): PROBNP in the last 8760 hours.   D-Dimer No results for input(s): DDIMER in the last 72 hours. Hemoglobin A1C No results for input(s): HGBA1C in the last 72 hours. Fasting Lipid Panel No results for input(s): CHOL, HDL, LDLCALC, TRIG, CHOLHDL, LDLDIRECT in the last 72 hours. Thyroid Function Tests No results for input(s): TSH, T4TOTAL, T3FREE, THYROIDAB in the last 72 hours.  Invalid input(s): FREET3  Other results:   Imaging    No results found.   Medications:     Scheduled Medications: . aspirin EC  81 mg Oral Daily  . atorvastatin  40 mg Oral q1800  . bisacodyl  10 mg Oral Daily   Or  . bisacodyl  10 mg Rectal Daily  . carvedilol  3.125 mg Oral BID WC  . Chlorhexidine Gluconate Cloth  6 each Topical Daily  . clopidogrel  75 mg Oral Daily  . colchicine  0.6 mg Oral Daily  . digoxin  0.125 mg Oral Daily  . docusate sodium  200 mg Oral Daily  . ferrous [id number] C-folic acid  1 capsule Oral BID PC  . furosemide  40 mg Oral Daily  . multivitamins with iron  1 tablet Oral Daily  . pantoprazole  20 mg Oral Daily  . potassium chloride  20 mEq Oral Daily  . spironolactone  25 mg Oral Daily    Infusions:   PRN Medications: ondansetron (ZOFRAN) IV, oxyCODONE, promethazine, sodium chloride flush, traMADol, white petrolatum   Assessment/Plan   1. CAD: S/p CABG.   - Continue ASA 81 + Plavix.  - Continue statin.  2. Acute on chronic systolic CHF: Ischemic cardiomyopathy.  Post-op echo was reviewed, showed EF 30-35%, moderate-severe RV dysfunction.  RV function worse post-op, V/Q scan not suggestive of PE.  He had been on midodrine for low BP but SBP currently in the 110s range.  No JVD but still has significant peripheral edema.  Weight is back down to pre-op baseline.  - Continue Lasix 40 mg daily.  Suspect primarily venous insufficiency given no JVD, really needs to wear compression stockings.  - Midodrine stopped yesterday and stable off it.   - Increase spironolactone to 25 mg daily.   - Continue digoxin 0.125 daily.  - Continue Coreg 3.125 mg bid.  - Next step will be low dose losartan and will see if he can eventually transition to [facility].  3. Anemia: Post-op blood loss.   Ready for CIR when bed available from my standpoint.   Length of Stay: 9  [name], MD  12/21/2018, 9:46 AM  Advanced Heart Failure Team Pager [phone] (M-F; 7a - 4p)  Please contact [facility] for night-coverage after hours (4p -7a ) and weekends on [email or web address]

## 2018-12-21 NOTE — H&P (Signed)
Physical Medicine and Rehabilitation Admission H&P     CC: Debility    HPI: Frank Love is a 63 year old male with history of HTN, endstage DJD right hip, CAD with systolic CHF who was admitted on 12/12/18 for CABG X 4 by Dr.Adkins. Post op had excessive output from chest tube requiring FFP, cryoprecipitate and 2 units PRBC. He was taken back to OR later that evening for mediastinal reexploration due to post op rebleeding. He has had issues with fluid overload requiring diuresis with dopamine as well as neosynephrine due to hypotension. Midodrine added for BP support and dopamine discontinued 11/24. Follow up echo 11/23 showed EF 30-35% with global hypokinesis and LV thrombus excluded with contrast.  Blood pressures stabilizing with resolution of dizziness therefore midodrine weaned off. Dr. Rayann Heman following to assist with acute on chronic systolic failure with peripheral edema being treated with addition of spironolactone. ABLA and thrombocytopenia being monitored. Hypokalemia resolved with supplementation and pre-renal azotemia stable. Therapy ongoing and CIR recommended due to debility.      Review of Systems  Constitutional: Negative for chills and fever.  HENT: Negative for hearing loss and tinnitus.   Eyes: Negative for blurred vision and double vision.  Respiratory: Negative for cough and shortness of breath.   Cardiovascular: Negative for chest pain and palpitations.  Gastrointestinal: Negative for heartburn and nausea.  Genitourinary: Positive for urgency (has problems getting there. ). Negative for dysuria.  Musculoskeletal: Positive for falls (due to right hip giving out) and joint pain (bone on bone right hip with pain).  Skin: Negative for rash.  Neurological: Positive for dizziness (with quick movements), focal weakness (RLE weakness due to right hip OA) and weakness.  Psychiatric/Behavioral: Negative for depression. The patient is not nervous/anxious.            Past Medical History:  Diagnosis Date  . Chronic pain of right knee    . Coronary artery disease    . Hypertension    . Myocardial infarction [facility])      possible in the past was told by a doctor but no one has ever said anything else  . Phimosis             Past Surgical History:  Procedure Laterality Date  . CIRCUMCISION N/A 10/12/2018    Procedure: CIRCUMCISION ADULT;  Surgeon: Franchot Gallo, MD;  Location: [facility];  Service: Urology;  Laterality: N/A;  45 mins  . COLONOSCOPY      . CORONARY ARTERY BYPASS GRAFT N/A 12/12/2018    Procedure: CORONARY ARTERY BYPASS GRAFTING (CABG) x four, using bilateral internal mammary arteries and right leg greater saphenous vein harvested endoscopically;  Surgeon: [name], MD;  Location: [place];  Service: Open Heart Surgery;  Laterality: N/A;  . LEFT HEART CATH AND CORONARY ANGIOGRAPHY N/A 11/28/2018    Procedure: LEFT HEART CATH AND CORONARY ANGIOGRAPHY;  Surgeon: Burnell Blanks, MD;  Location: [facility];  Service: Cardiovascular;  Laterality: N/A;  . MEDIASTINAL EXPLORATION N/A 12/12/2018    Procedure: Mediastinal Re-Exploration after bypass graft;  Surgeon: [name], MD;  Location: [facility];  Service: Open Heart Surgery;  Laterality: N/A;  . TEE WITHOUT CARDIOVERSION N/A 12/12/2018    Procedure: TRANSESOPHAGEAL ECHOCARDIOGRAM (TEE);  Surgeon: [name], MD;  Location: [facility];  Service: Open Heart Surgery;  Laterality: N/A;  . TOOTH EXTRACTION  Family History  Problem Relation Age of Onset  . Diabetes Mother    . Heart disease Mother        Social History:  Married. 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 He reports that he has never smoked. He has never used smokeless tobacco. He reports that he does not drink alcohol or use drugs.    Allergies: No Known Allergies            Medications Prior to Admission  Medication Sig Dispense Refill  . aspirin EC 81 MG tablet  Take 81 mg by mouth at bedtime.       . metoprolol tartrate (LOPRESSOR) 25 MG tablet Take 1 tablet (25 mg total) by mouth 2 (two) times daily. 180 tablet 1  . sacubitril-valsartan (ENTRESTO) 24-26 MG Take 1 tablet by mouth 2 (two) times daily. 60 tablet 3  . nitroGLYCERIN (NITROSTAT) 0.4 MG SL tablet Place 1 tablet (0.4 mg total) under the tongue every 5 (five) minutes as needed for chest pain. 25 tablet 3      Drug Regimen Review  Drug regimen was reviewed and remains appropriate with no significant issues identified   Home: Home Living Family/patient expects to be discharged to:: Private residence Living Arrangements: Spouse/significant other Available Help at Discharge: Family, Available 24 hours/day Type of Home: Apartment Home Access: Stairs to enter [employer] of Steps: 3+5 Entrance Stairs-Rails: Right Home Layout: One level Bathroom Shower/Tub: Chiropodist: Standard Home Equipment: Environmental consultant - 2 wheels, Cane - single point Additional Comments: Pt sleeps in armchair with feet on ottoman   Functional History: Prior Function Level of Independence: Independent with assistive device(s) Comments: Used cane due to bad rt hip. Wife reports several falls at home   Functional Status:  Mobility: Bed Mobility Overal bed mobility: Needs Assistance Bed Mobility: Supine to Sit Supine to sit: Mod assist, HOB elevated Sit to sidelying: Mod assist, HOB elevated General bed mobility comments: pt able to bring legs off bed, mod assist to ascend trunk; increased time and effort with cueing for technique Transfers Overall transfer level: Needs assistance Equipment used: Rolling walker (2 wheeled) Transfers: Sit to/from Stand Sit to Stand: Mod assist, Max assist, From elevated surface Stand pivot transfers: Min assist General transfer comment: from elevated EOB mod assist, from recliner max assist using momentum holding onto heart pillow to ascend into  standing, with support to power up and steady; small shuffling steps to recliner givne min assist Ambulation/Gait Ambulation/Gait assistance: Min assist, +2 safety/equipment Gait Distance (Feet): 26 Feet(+30') Assistive device: Rolling walker (2 wheeled) Gait Pattern/deviations: Wide base of support, Step-to pattern, Trunk flexed, Antalgic, Shuffle General Gait Details: Slow, unsteady and antalgic like gait with short step lengths bilaterally. Wide BoS. 1 seated rest break. VSS. Gait velocity: decreased   ADL: ADL Overall ADL's : Needs assistance/impaired Eating/Feeding: Set up, Sitting Grooming: Set up, Sitting, Oral care Grooming Details (indicate cue type and reason): seated in recliner  Upper Body Bathing: Moderate assistance, Sitting Lower Body Bathing: Total assistance Lower Body Bathing Details (indicate cue type and reason): Mod A +2 sit<>stand from recliner with momentum Upper Body Dressing : Maximal assistance, Sitting Lower Body Dressing: Total assistance, Sit to/from stand Lower Body Dressing Details (indicate cue type and reason): requires assist to adjust socks, mod-max assist sit to stand  Toilet Transfer: Moderate assistance, Maximal assistance, Stand-pivot, RW Toilet Transfer Details (indicate cue type and reason): simulated to recliner Toileting- Clothing Manipulation and Hygiene: Total assistance Toileting - Clothing  Manipulation Details (indicate cue type and reason): Mod A +2 sit<>stand from recliner with momentum Functional mobility during ADLs: Moderate assistance, Maximal assistance, Rolling walker, Cueing for safety, Cueing for sequencing General ADL Comments: provided handout for sternal precautions for pt to review, verbally educated precautions but required cueing to adhere functioanlly   Cognition: Cognition Overall Cognitive Status: Within Functional Limits for tasks assessed Orientation Level: Oriented X4 Cognition Arousal/Alertness: Awake/alert  Behavior During Therapy: WFL for tasks assessed/performed Overall Cognitive Status: Within Functional Limits for tasks assessed     Blood pressure 113/64, pulse 69, temperature 98.7 F (37.1 C), temperature source Oral, resp. rate 20, height 6\' 3"  (1.905 m), weight 105.2 kg, SpO2 96 %. Physical Exam  Nursing note and vitals reviewed. Constitutional: He is oriented to person, place, and time. He appears well-developed and well-nourished. No distress.  HENT:  Head: Normocephalic and atraumatic.  Eyes: Pupils are equal, round, and reactive to light.  Neck: Normal range of motion.  Cardiovascular: Normal rate and regular rhythm.  Respiratory: Effort normal. Stridor present. No respiratory distress. He has decreased breath sounds in the right lower field and the left lower field. He exhibits tenderness.  Chest wall with staples in place.   GI: He exhibits no distension. There is no abdominal tenderness.  Genitourinary:    Genitourinary Comments: Scrotal edema   Musculoskeletal:     Comments: RLE weakness with decreased ROM due to right hip pain and min edema right knee.   Neurological: He is alert and oriented to person, place, and time. No cranial nerve deficit. Coordination normal.  UE motor 4/5. LE 4- to 4/5 prox to distal. No sensory findings. Cognitively appropriate.   Skin: No rash noted. He is not diaphoretic. No erythema.  Psychiatric: He has a normal mood and affect. His behavior is normal. Judgment and thought content normal.      Lab Results Last 48 Hours        Results for orders placed or performed during the hospital encounter of 12/12/18 (from the past 48 hour(s))  Basic metabolic panel     Status: Abnormal    Collection Time: 12/20/18  8:46 AM  Result Value Ref Range    Sodium 141 135 - 145 mmol/L    Potassium 3.9 3.5 - 5.1 mmol/L    Chloride 103 98 - 111 mmol/L    CO2 29 22 - 32 mmol/L    Glucose, Bld 130 (H) 70 - 99 mg/dL    BUN 29 (H) 8 - 23 mg/dL    Creatinine,  Ser 1.17 0.61 - 1.24 mg/dL    Calcium 8.4 (L) 8.9 - 10.3 mg/dL    GFR calc non Af Amer >60 >60 mL/min    GFR calc Af Amer >60 >60 mL/min    Anion gap 9 5 - 15      Comment: Performed at [facility], [place] [address], [place], [place] [id number]  Basic metabolic panel     Status: Abnormal    Collection Time: 12/21/18  3:01 AM  Result Value Ref Range    Sodium 144 135 - 145 mmol/L    Potassium 3.7 3.5 - 5.1 mmol/L    Chloride 105 98 - 111 mmol/L    CO2 28 22 - 32 mmol/L    Glucose, Bld 103 (H) 70 - 99 mg/dL    BUN 28 (H) 8 - 23 mg/dL    Creatinine, Ser 1.06 0.61 - 1.24 mg/dL    Calcium 8.1 (L) 8.9 -  10.3 mg/dL    GFR calc non Af Amer >60 >60 mL/min    GFR calc Af Amer >60 >60 mL/min    Anion gap 11 5 - 15      Comment: Performed at [facility] [address], [place], [place] [address]      Imaging Results (Last 48 hours)  Dg Chest 2 View   Result Date: 12/19/2018 CLINICAL DATA:  Postop complication. Status post CABG on 12/12/2018. EXAM: CHEST - 2 VIEW COMPARISON:  12/18/2018 FINDINGS: Numerous leads and wires project over the chest. Prior median sternotomy. Midline trachea. Mild cardiomegaly. Atherosclerosis in the transverse aorta. Left larger than right pleural effusions are small, likely similar given differences in technique. The tiny left apical pneumothorax has improved to resolved. Pleural thickening versus a less than 5% left apical pneumothorax. No right-sided pneumothorax identified. Improved left and similar right base atelectasis. IMPRESSION: Slightly improved aeration, with decreased bibasilar atelectasis. Small bilateral pleural effusions are not significantly changed. Improved to resolved tiny left apical pneumothorax. Aortic Atherosclerosis (ICD10-I70.0). Electronically Signed   By: Abigail Miyamoto M.D.   On: 12/19/2018 16:40    Nm Pulmonary Perfusion   Result Date: 12/19/2018 CLINICAL DATA:  Heart failure, lower extremity edema, elevated BNP EXAM: NUCLEAR  MEDICINE PERFUSION LUNG SCAN TECHNIQUE: Perfusion images were obtained in multiple projections after intravenous injection of radiopharmaceutical. Ventilation scans intentionally deferred if perfusion scan and chest x-ray adequate for interpretation during COVID 19 epidemic. RADIOPHARMACEUTICALS:  1.45 mCi Tc-2m MAA IV COMPARISON:  Correlation with chest radiographs dated 12/19/2018 FINDINGS: No wedge-shaped perfusion defects to suggest pulmonary embolism. Ventilation imaging was deferred. When correlating with chest radiographs, there is a small left pleural effusion. IMPRESSION: Negative for pulmonary embolism. Electronically Signed   By: Julian Hy M.D.   On: 12/19/2018 18:13            Medical Problem List and Plan: 1.  Functional and mobility deficits secondary to CABG x4 and associated post-op complications with subsequent debility             -patient may  shower             -ELOS/Goals: 7-11 days, mod I goals 2.  Antithrombotics: -DVT/anticoagulation:  Mechanical: Sequential compression devices, below knee Bilateral lower extremities             -antiplatelet therapy: ASA/PLavix 3. Chronic right hip pain/Pain Management: tylenol prn.  Has not used any oxycodone in 48 hrs--continue prn.  4. Mood: LCSW to follow for evaluation and support.              -antipsychotic agents: N/A 5. Neuropsych: This patient is capable of making decisions on his own behalf. 6. Skin/Wound Care: Monitor incisions for healing. Continue MVI. Add protein supplement. Per CVTA- pacing sutures to come out Monday and staples to be d/c 12/1.  7. Fluids/Electrolytes/Nutrition: Strict  I/O. Check lytes in am.  8. Acute on chronic systolic CHF: TEDs for peripheral edema.   ON ASA, Lipitor,  coreg bid, lanoxin, lasix 40 mg daily and Spironolactone to 25 mg on 11/27  -check daily weights 9. ABLA:  Recheck in am. Continue iron supplement.  10. Pre-renal azotemia: stable 34-->28. Will monitor for now--recheck labs  tomorrow 11. Thrombocytopenia: Resolving.          Bary Leriche, PA-C 12/21/2018  I have personally performed a face to face diagnostic evaluation of this patient and formulated the key components of the plan.  Additionally, I have  personally reviewed laboratory data, imaging studies, as well as relevant notes and concur with the physician assistant's documentation above.  The patient's status has not changed from the original H&P.  Any changes in documentation from the acute care chart have been noted above.  Meredith Staggers, MD, [facility]rown

## 2018-12-21 NOTE — Progress Notes (Signed)
Inpatient Rehabilitation-Admissions Coordinator   Received medical clearance from attending service for admit to CIR today. Pt is in agreement for pursuit of CIR admission today. We reviewed insurance benefit letter and consent form. RN and [facility] team aware of plan for today.   Please call if questions.   Jhonnie Garner, OTR/L  Rehab Admissions Coordinator  [phone] 12/21/2018 10:58 AM

## 2018-12-21 NOTE — Progress Notes (Signed)
Patient ID: Frank Love, male   DOB: [birth year], 63 y.o.   MRN: [id number] Admit to unit, reviewed therapy schedule, medications and plan of care. Margarito Liner

## 2018-12-22 ENCOUNTER — Inpatient Hospital Stay (HOSPITAL_COMMUNITY): Payer: No Typology Code available for payment source | Admitting: Occupational Therapy

## 2018-12-22 ENCOUNTER — Inpatient Hospital Stay (HOSPITAL_COMMUNITY): Payer: No Typology Code available for payment source

## 2018-12-22 DIAGNOSIS — R5381 Other malaise: Principal | ICD-10-CM

## 2018-12-22 LAB — CBC WITH DIFFERENTIAL/PLATELET
Abs Immature Granulocytes: 0.13 10*3/uL — ABNORMAL HIGH (ref 0.00–0.07)
Basophils Absolute: 0.1 10*3/uL (ref 0.0–0.1)
Basophils Relative: 1 %
Eosinophils Absolute: 0.7 10*3/uL — ABNORMAL HIGH (ref 0.0–0.5)
Eosinophils Relative: 9 %
HCT: 28.5 % — ABNORMAL LOW (ref 39.0–52.0)
Hemoglobin: 9.1 g/dL — ABNORMAL LOW (ref 13.0–17.0)
Immature Granulocytes: 2 %
Lymphocytes Relative: 14 %
Lymphs Abs: 1.1 10*3/uL (ref 0.7–4.0)
MCH: 29.6 pg (ref 26.0–34.0)
MCHC: 31.9 g/dL (ref 30.0–36.0)
MCV: 92.8 fL (ref 80.0–100.0)
Monocytes Absolute: 0.6 10*3/uL (ref 0.1–1.0)
Monocytes Relative: 7 %
Neutro Abs: 5.7 10*3/uL (ref 1.7–7.7)
Neutrophils Relative %: 67 %
Platelets: 227 10*3/uL (ref 150–400)
RBC: 3.07 MIL/uL — ABNORMAL LOW (ref 4.22–5.81)
RDW: 14.4 % (ref 11.5–15.5)
WBC: 8.3 10*3/uL (ref 4.0–10.5)
nRBC: 0 % (ref 0.0–0.2)

## 2018-12-22 LAB — COMPREHENSIVE METABOLIC PANEL
ALT: 22 U/L (ref 0–44)
AST: 22 U/L (ref 15–41)
Albumin: 2.8 g/dL — ABNORMAL LOW (ref 3.5–5.0)
Alkaline Phosphatase: 57 U/L (ref 38–126)
Anion gap: 12 (ref 5–15)
BUN: 23 mg/dL (ref 8–23)
CO2: 27 mmol/L (ref 22–32)
Calcium: 8.3 mg/dL — ABNORMAL LOW (ref 8.9–10.3)
Chloride: 103 mmol/L (ref 98–111)
Creatinine, Ser: 1.18 mg/dL (ref 0.61–1.24)
GFR calc Af Amer: >60 mL/min (ref >60)
GFR calc non Af Amer: >60 mL/min (ref >60)
Glucose, Bld: 104 mg/dL — ABNORMAL HIGH (ref 70–99)
Potassium: 4.1 mmol/L (ref 3.5–5.1)
Sodium: 142 mmol/L (ref 135–145)
Total Bilirubin: 1.3 mg/dL — ABNORMAL HIGH (ref 0.3–1.2)
Total Protein: 5.6 g/dL — ABNORMAL LOW (ref 6.5–8.1)

## 2018-12-22 NOTE — Evaluation (Signed)
Physical Therapy Assessment and Plan  Patient Details  Name: Frank Love MRN: [id number] Date of Birth: Jul 27, 1955  PT Diagnosis: Abnormal posture, Abnormality of gait, Difficulty walking, Edema, Low back pain, Muscle weakness and Pain in joint Rehab Potential: Good ELOS: 8-12 days   Today's Date: 12/22/2018 PT Individual Time: 1430-1535 PT Individual Time Calculation (min): 65 min    Problem List:  Patient Active Problem List   Diagnosis Date Noted  . Debility 12/21/2018  . 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 Coronary artery disease 12/12/2018  . Coronary artery disease involving native coronary artery of native heart without angina pectoris   . Ischemic cardiomyopathy   . Chronic systolic HF (heart failure) ([place]) 10/28/2018  . Preop cardiovascular exam 10/28/2018  . Abnormal EKG 10/28/2018    Past Medical History:  Past Medical History:  Diagnosis Date  . Chronic pain of right knee   . Coronary artery disease   . Hypertension   . Phimosis    Past Surgical History:  Past Surgical History:  Procedure Laterality Date  . CIRCUMCISION N/A 10/12/2018   Procedure: CIRCUMCISION ADULT;  Surgeon: Franchot Gallo, MD;  Location: [facility];  Service: Urology;  Laterality: N/A;  45 mins  . COLONOSCOPY    . CORONARY ARTERY BYPASS GRAFT N/A 12/12/2018   Procedure: CORONARY ARTERY BYPASS GRAFTING (CABG) x four, using bilateral internal mammary arteries and right leg greater saphenous vein harvested endoscopically;  Surgeon: [name], MD;  Location: [facility];  Service: Open Heart Surgery;  Laterality: N/A;  . LEFT HEART CATH AND CORONARY ANGIOGRAPHY N/A 11/28/2018   Procedure: LEFT HEART CATH AND CORONARY ANGIOGRAPHY;  Surgeon: Burnell Blanks, MD;  Location: [facility];  Service: Cardiovascular;  Laterality: N/A;  . MEDIASTINAL EXPLORATION N/A 12/12/2018   Procedure: Mediastinal Re-Exploration after bypass graft;  Surgeon: [name], MD;  Location: [facility];   Service: Open Heart Surgery;  Laterality: N/A;  . TEE WITHOUT CARDIOVERSION N/A 12/12/2018   Procedure: TRANSESOPHAGEAL ECHOCARDIOGRAM (TEE);  Surgeon: [name], MD;  Location: [place];  Service: Open Heart Surgery;  Laterality: N/A;  . TOOTH EXTRACTION      Assessment & Plan Clinical Impression: Patient is a 63 y.o. year old male with history of HTN,endstage DJD right hip,CAD with systolic CHF who was admitted on 12/12/18 for CABG X 4 by Dr.Adkins. Post op had excessive output from chest tube requiring FFP, cryoprecipitate and 2 units PRBC. He was taken back to OR later that evening for mediastinal reexplorationdue to post op rebleeding. He has had issues with fluid overload requiring diuresis with dopamine as well as neosynephrine due to hypotension. Midodrine added for BP support and dopamine discontinued 11/24. Follow up echo 11/23 showed EF 30-35% with global hypokinesis and LV thrombus excluded with contrast.Blood pressures stabilizing with resolution of dizziness therefore midodrine weaned off. Dr. Rayann Heman following to assist with acute on chronic systolic failure with peripheral edema being treated with addition of spironolactone. ABLA and thrombocytopenia being monitored. Hypokalemia resolved with supplementation and pre-renal azotemia stable. Therapy ongoing and CIR recommended due to debility.  Patient transferred to CIR on 12/21/2018 .   Patient currently requires mod with mobility secondary to muscle weakness and muscle joint tightness, decreased cardiorespiratoy endurance, intermittent "zig zags" across his vision and decreased sitting balance, decreased standing balance, decreased postural control, decreased balance strategies and difficulty maintaining precautions.  Prior to hospitalization, patient was modified independent  with mobility and lived with Spouse in a  Apartment home.  Home access is 1 no rails and 3+5 with R railStairs to enter.  Patient will benefit from skilled  PT intervention to maximize safe functional mobility, minimize fall risk and decrease caregiver burden for planned discharge home with 24 hour supervision.  Anticipate patient will benefit from follow up [place] at discharge.  PT - End of Session Activity Tolerance: Tolerates 30+ min activity with multiple rests Endurance Deficit: Yes Endurance Deficit Description: Requires frequent rest breaks between activity PT Assessment Rehab Potential (ACUTE/IP ONLY): Good PT Barriers to Discharge: Home environment access/layout;Lack of/limited family support PT Barriers to Discharge Comments: Patient has 9 STE his 1 level apartment and his wife can only provide supervision at d/c PT Patient demonstrates impairments in the following area(s): Balance;Edema;Endurance;Motor;Nutrition;Pain;Perception;Safety;Skin Integrity PT Transfers Functional Problem(s): Bed Mobility;Bed to Chair;Car;Furniture;Floor PT Locomotion Functional Problem(s): Ambulation;Wheelchair Mobility;Stairs PT Plan PT Intensity: Minimum of 1-2 x/day ,45 to 90 minutes PT Frequency: 5 out of 7 days PT Duration Estimated Length of Stay: 8-12 days PT Treatment/Interventions: Ambulation/gait training;Discharge planning;Functional mobility training;Psychosocial support;Therapeutic Activities;Visual/perceptual remediation/compensation;Balance/vestibular training;Disease management/prevention;Neuromuscular re-education;Skin care/wound management;Therapeutic Exercise;Wheelchair propulsion/positioning;DME/adaptive equipment instruction;Pain management;Splinting/orthotics;UE/LE Strength taining/ROM;Community reintegration;Functional electrical stimulation;Patient/family education;Stair training;UE/LE Coordination activities PT Transfers Anticipated Outcome(s): mod I with LRAD PT Locomotion Anticipated Outcome(s): supervision 100 feet with LRAD PT Recommendation Follow Up Recommendations: Home health PT Patient destination: Home Equipment Recommended: To  be determined Equipment Details: Patient has a [facility]  Skilled Therapeutic Intervention Session 1: In addition to the PT evaluation below, the patient performed the following skilled PT interventions: Patient in bed upon PT arrival. Patient alert and agreeable to PT session. Patient denied pain, but reported mild nausea during session. Noted B pitting edema at beginning of session. B TED hose and non-skid socks donned with total A at beginning of session.   Therapeutic Activity: Bed Mobility: Patient performed bed mobility as below in a flat bed without use of bed rails. Provided verbal cues for pushing with the opposite LE to roll. Transfers: Patient performed sit to/from stand and stand pivot x1 without AD with mod A for physical assist and balance and with RW x2 with min A. Provided verbal cues for use of RW, hands on thighs or chest to maintain sternal precautions, and leaning forward to stand.  Gait Training:  Patient ambulated 25 feet using RW with CGA. Ambulated with forward trunk lean, significant R abducted gait, step-to gait pattern leading with R, R knee flexion in stance, decreased R weight shift, and decreased B foot clearance and step length. Provided verbal cues for erect posture and decreased BOS.  Wheelchair Mobility:  Patient was transported in the w/c with total A throughout session to maintain sternal precautions, energy conservation and time management.  Patient in recliner in room at end of session with breaks locked, chair alarm set, and all needs within reach.   Instructed pt in results of PT evaluation as detailed below, PT POC, rehab potential, rehab goals, and discharge recommendations. Additionally discussed CIR's policies regarding fall safety and use of chair alarm and/or quick release belt. Pt verbalized understanding and in agreement. Will update pt's family members as they become available.   Session 2: Patient in bed upon PT arrival. Patient alert and agreeable  to PT session. Patient denied pain throughout session, except as noted below.   Therapeutic Activity: Bed Mobility: Patient performed supine to sit in a flat bed without use of bed rails and supine to/from sit on a mat table with min A for trunk support. Provided verbal  cues for not using UEs to push up to maintain sternal precautions and sliding LEs off the bed before sitting up. Transfers: Patient performed sit to/from stand x4 with min A progressing to CGA using the RW. Provided verbal cues for hands on thighs or chest to maintain sternal precautions, leaning far forward to stand, and controlling descent when sitting.  Gait Training:  Patient ambulated 62 feet using RW with CGA. Ambulated with mildly narrower BOS and improved posture following NMR, otherwise as above.  Patient attempted to ascend a 3" step holding a R rail, but was unable to shift his weight to his R LE to lift his L LE to the top of the step without increased UE support, instructed patient not to lean on his arms to maintain weight bearing precautions.   Neuromuscular Re-ed: Patient performed standing balance 2x2 min using the RW with supervision using a tall mirror as visual feedback focusing on improved symmetry in standing. Patient was able to decreased his BOS and increased R weight shift without increase in R hip pain and progress to B weight shifts x10.  Had patient lie in supine and extend his R LE focusing on extending it down beneath his hip and stretching out his hip flexor and hamstrings 2x1 min, patient has great difficulty preventing R abduction and is unable to obtain neutral hip and knee extension  Patient in recliner in the room at end of session with breaks locked, chair alarm set, and all needs within reach.     PT Evaluation Precautions/Restrictions Precautions Precautions: Fall;Sternal Precaution Comments: right hip DJD Restrictions Weight Bearing Restrictions: Yes RLE Weight Bearing: Weight bearing  as tolerated Other Position/Activity Restrictions: sternal precautions  Home Living/Prior Functioning Home Living Available Help at Discharge: Family;Available 24 hours/day Type of Home: Apartment Home Access: Stairs to enter Entrance Stairs-Number of Steps: 1 no rails and 3+5 with R rail Entrance Stairs-Rails: Right Home Layout: One level Bathroom Shower/Tub: Tub/shower unit Additional Comments: Pt sleeps in armchair with feet on ottoman for the past 7 years, would like to try to sleep in the bed again at d/c  Lives With: Spouse Prior Function Level of Independence: Independent with basic ADLs;Independent with homemaking with ambulation;Independent with gait;Independent with transfers;Requires assistive device for independence  Able to Take Stairs?: Yes Driving: Yes Vocation: Full time employment Vocation Requirements: Provides snacks to buisiness Comments: Used cane due to bad rt hip. Wife reports several falls at home, patient states that he had 2 falls this year to lower himself to the ground, no injuries Vision/Perception  Vision - Assessment Eye Alignment: Within Functional Limits Ocular Range of Motion: Within Functional Limits Alignment/Gaze Preference: Within Defined Limits Tracking/Visual Pursuits: Able to track stimulus in all quads without difficulty Saccades: Within functional limits Convergence: Within functional limits Perception Perception: Within Functional Limits Praxis Praxis: Intact  Cognition Overall Cognitive Status: Within Functional Limits for tasks assessed Arousal/Alertness: Awake/alert Attention: Focused;Sustained Focused Attention: Appears intact Sustained Attention: Appears intact Memory: Appears intact Awareness: Appears intact Problem Solving: Appears intact Safety/Judgment: Appears intact Sensation Sensation Light Touch: Appears Intact Proprioception: Appears Intact Coordination Gross Motor Movements are Fluid and Coordinated: No Fine  Motor Movements are Fluid and Coordinated: Yes Coordination and Movement Description: Compensates for R hip DJD with significant R hip abduction and knee flexionin stance with decreased weight shift onto R LE, decreased activity tolerance and functional mobility limited by sternal precautions Motor  Motor Motor: Other (comment) Motor - Skilled Clinical Observations: Compensates for R hip DJD with  significant R hip abduction and knee flexionin stance with decreased weight shift onto R LE, decreased activity tolerance and functional mobility limited by sternal precautions  Mobility Bed Mobility Bed Mobility: Rolling Right;Rolling Left;Supine to Sit;Sit to Supine Rolling Right: Supervision/verbal cueing Rolling Left: Moderate Assistance - Patient 50-74% Supine to Sit: Minimal Assistance - Patient > 75% Sit to Supine: Minimal Assistance - Patient > 75% Transfers Transfers: Sit to Stand;Stand to Sit;Stand Pivot Transfers Sit to Stand: Minimal Assistance - Patient > 75% Stand to Sit: Minimal Assistance - Patient > 75% Stand Pivot Transfers: Minimal Assistance - Patient > 75% Transfer (Assistive device): Rolling walker Locomotion  Gait Ambulation: Yes Gait Assistance: Contact Guard/Touching assist Gait Distance (Feet): 25 Feet Assistive device: Rolling walker Gait Gait: Yes Gait Pattern: Decreased stance time - right;Decreased step length - right;Decreased stride length;Decreased hip/knee flexion - right;Step-to pattern;Decreased weight shift to right;Right flexed knee in stance;Antalgic;Decreased trunk rotation;Abducted- right;Wide base of support;Trunk rotated posteriorly on right Gait velocity: significantly decreased Stairs / Additional Locomotion Stairs: No(Patient unable to weight shift to R LE to step up with L) Wheelchair Mobility Wheelchair Mobility: No(due to sternal precautions)  Trunk/Postural Assessment  Cervical Assessment Cervical Assessment: Within Functional  Limits Thoracic Assessment Thoracic Assessment: Within Functional Limits(sternal precautions) Lumbar Assessment Lumbar Assessment: Within Functional Limits Postural Control Postural Control: Within Functional Limits  Balance Static Sitting Balance Static Sitting - Balance Support: No upper extremity supported;Feet supported Static Sitting - Level of Assistance: 5: Stand by assistance Dynamic Sitting Balance Dynamic Sitting - Balance Support: No upper extremity supported;Feet supported;During functional activity Dynamic Sitting - Level of Assistance: 5: Stand by assistance Dynamic Sitting - Balance Activities: Forward lean/weight shifting;Lateral lean/weight shifting Static Standing Balance Static Standing - Balance Support: Right upper extremity supported;Left upper extremity supported;During functional activity Static Standing - Level of Assistance: 5: Stand by assistance Dynamic Standing Balance Dynamic Standing - Balance Support: Bilateral upper extremity supported;During functional activity Dynamic Standing - Level of Assistance: 4: Min assist Dynamic Standing - Balance Activities: Lateral lean/weight shifting;Forward lean/weight shifting Extremity Assessment  RUE Assessment General Strength Comments: sternal precautions - WFL LUE Assessment General Strength Comments: sternal precautions - WFL RLE Assessment RLE Assessment: Exceptions to [facility] Active Range of Motion (AROM) Comments: Decreased L hip extension, flexion, and internal rotation (unable to reach neutral with firm and painful end feel), and tight hamstrings and gastrocs General Strength Comments: Grossly in sitting: 4/5 throughout LLE Assessment LLE Assessment: Within Functional Limits Active Range of Motion (AROM) Comments: WFL for all functional mobility General Strength Comments: Grossly in sitting: 5/5 throughout except DF/PF 4/5    Refer to Care Plan for Long Term Goals  Recommendations for other services:  None   Discharge Criteria: Patient will be discharged from PT if patient refuses treatment 3 consecutive times without medical reason, if treatment goals not met, if there is a change in medical status, if patient makes no progress towards goals or if patient is discharged from hospital.  The above assessment, treatment plan, treatment alternatives and goals were discussed and mutually agreed upon: by patient  Doreene Burke PT, DPT  12/22/2018, 4:45 PM

## 2018-12-22 NOTE — Evaluation (Signed)
Occupational Therapy Assessment and Plan  Patient Details  Name: Frank Love MRN: [id number] Date of Birth: 12/28/1955  OT Diagnosis: muscle weakness (generalized) Rehab Potential: Rehab Potential (ACUTE ONLY): Good ELOS: 10-12 days   Today's Date: 12/22/2018 OT Individual Time: 7793-9030 OT Individual Time Calculation (min): 60 min     Problem List:  Patient Active Problem List   Diagnosis Date Noted  . Debility 12/21/2018  . 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 Coronary artery disease 12/12/2018  . Coronary artery disease involving native coronary artery of native heart without angina pectoris   . Ischemic cardiomyopathy   . Chronic systolic HF (heart failure) ([place]) 10/28/2018  . Preop cardiovascular exam 10/28/2018  . Abnormal EKG 10/28/2018    Past Medical History:  Past Medical History:  Diagnosis Date  . Chronic pain of right knee   . Coronary artery disease   . Hypertension   . Phimosis    Past Surgical History:  Past Surgical History:  Procedure Laterality Date  . CIRCUMCISION N/A 10/12/2018   Procedure: CIRCUMCISION ADULT;  Surgeon: Franchot Gallo, MD;  Location: [facility];  Service: Urology;  Laterality: N/A;  45 mins  . COLONOSCOPY    . CORONARY ARTERY BYPASS GRAFT N/A 12/12/2018   Procedure: CORONARY ARTERY BYPASS GRAFTING (CABG) x four, using bilateral internal mammary arteries and right leg greater saphenous vein harvested endoscopically;  Surgeon: [name], MD;  Location: [facility];  Service: Open Heart Surgery;  Laterality: N/A;  . LEFT HEART CATH AND CORONARY ANGIOGRAPHY N/A 11/28/2018   Procedure: LEFT HEART CATH AND CORONARY ANGIOGRAPHY;  Surgeon: Burnell Blanks, MD;  Location: [facility];  Service: Cardiovascular;  Laterality: N/A;  . MEDIASTINAL EXPLORATION N/A 12/12/2018   Procedure: Mediastinal Re-Exploration after bypass graft;  Surgeon: [name], MD;  Location: [facility];  Service: Open Heart Surgery;  Laterality: N/A;  .  TEE WITHOUT CARDIOVERSION N/A 12/12/2018   Procedure: TRANSESOPHAGEAL ECHOCARDIOGRAM (TEE);  Surgeon: [name], MD;  Location: [facility];  Service: Open Heart Surgery;  Laterality: N/A;  . TOOTH EXTRACTION      Assessment & Plan Clinical Impression: Frank Love is a 63 year old male with history of HTN, endstage DJD right hip, CAD with systolic CHF who was admitted on 12/12/18 for CABG X 4 by Dr.Adkins. Post op had excessive output from chest tube requiring FFP, cryoprecipitate and 2 units PRBC. He was taken back to OR later that evening for mediastinal reexploration due to post op rebleeding. He has had issues with fluid overload requiring diuresis with dopamine as well as neosynephrine due to hypotension. Midodrine added for BP support and dopamine discontinued 11/24. Follow up echo 11/23 showed EF 30-35% with global hypokinesis and LV thrombus excluded with contrast.  Blood pressures stabilizing with resolution of dizziness therefore midodrine weaned off. Dr. Rayann Heman following to assist with acute on chronic systolic failure with peripheral edema being treated with addition of spironolactone. ABLA and thrombocytopenia being monitored. Hypokalemia resolved with supplementation and pre-renal azotemia stable. Therapy ongoing and CIR recommended due to debility.   Patient transferred to CIR on 12/21/2018 .    Patient currently requires max with lower body basic self-care skills secondary to muscle weakness, decreased cardiorespiratoy endurance and decreased standing balance and decreased balance strategies.  Prior to hospitalization, patient was fully independent but did have difficulty with ambulation due to arthritis in his R hip.  He had a unique gait pattern in which he walks with  his leg out to the side in excessive abduction.   Patient will benefit from skilled intervention to increase independence with basic self-care skills prior to discharge home with care partner.  Anticipate patient  will require 24 hour supervision and follow up home health.  OT - End of Session Activity Tolerance: Tolerates 10 - 20 min activity with multiple rests Endurance Deficit: Yes OT Assessment Rehab Potential (ACUTE ONLY): Good OT Barriers to Discharge: Home environment access/layout OT Barriers to Discharge Comments: several stairs to enter home OT Patient demonstrates impairments in the following area(s): Balance;Endurance;Motor OT Basic ADL's Functional Problem(s): Bathing;Dressing;Toileting OT Transfers Functional Problem(s): Toilet;Tub/Shower OT Additional Impairment(s): None OT Plan OT Intensity: Minimum of 1-2 x/day, 45 to 90 minutes OT Frequency: 5 out of 7 days OT Duration/Estimated Length of Stay: 10-12 days OT Treatment/Interventions: Balance/vestibular training;Discharge planning;DME/adaptive equipment instruction;Functional mobility training;Psychosocial support;Patient/family education;Self Care/advanced ADL retraining;Therapeutic Activities;UE/LE Strength taining/ROM;Therapeutic Exercise OT Self Feeding Anticipated Outcome(s): no goal, pt is Independent OT Basic Self-Care Anticipated Outcome(s): S OT Toileting Anticipated Outcome(s): S OT Bathroom Transfers Anticipated Outcome(s): S OT Recommendation Patient destination: Home Follow Up Recommendations: Home health OT Equipment Recommended: Tub/shower bench;To be determined   Skilled Therapeutic Intervention Pt seen for initial evaluation and ADL training, reviewed role of OT and discussed pt's goals.  Pt completed self care as indicated below in ADL documentation. Overall s/u UB self care and mod-max LB self care and mod A transfers as he has sternal precautions and cannot use arms to push up to stand.  Due to weak R hip, he relied on his arms prior to admission. He did ambulate 15 ft in room with RW with mod A from recliner to w/c and stood several times. Overall, good participation. Discussed OT plan of care, LOS, goals.  Pt  resting in w.c with seat alarm on and all needs met.    OT Evaluation Precautions/Restrictions  Precautions Precautions: Fall;Sternal Precaution Comments: right hip DJD Restrictions Weight Bearing Restrictions: Yes RLE Weight Bearing: Weight bearing as tolerated Other Position/Activity Restrictions: sternal precautions  Pain Pain Assessment Pain Score: 0-No pain Home Living/Prior Functioning Home Living Family/patient expects to be discharged to:: Private residence Living Arrangements: Spouse/significant other Available Help at Discharge: Family, Available 24 hours/day Type of Home: Apartment Home Access: Stairs to enter [employer] of Steps: 1 no rails and 3+5 with R rail Entrance Stairs-Rails: Right Home Layout: One level Bathroom Shower/Tub: Chiropodist: Standard Additional Comments: Pt sleeps in armchair with feet on ottoman for the past 7 years, would like to try to sleep in the bed again at d/c  Lives With: Spouse Prior Function Level of Independence: Independent with basic ADLs, Independent with homemaking with ambulation, Independent with gait, Independent with transfers, Requires assistive device for independence  Able to Take Stairs?: Yes Driving: Yes Vocation: Full time employment Vocation Requirements: Provides snacks to buisiness Comments: Used cane due to bad rt hip. Wife reports several falls at home, patient states that he had 2 falls this year to lower himself to the ground, no injuries ADL ADL Eating: Independent Grooming: Setup Where Assessed-Grooming: Wheelchair, Sitting at sink Upper Body Bathing: Setup Where Assessed-Upper Body Bathing: Wheelchair, Sitting at sink Lower Body Bathing: Moderate assistance Where Assessed-Lower Body Bathing: Sitting at sink, Wheelchair, Standing at sink Upper Body Dressing: Setup Where Assessed-Upper Body Dressing: Wheelchair Lower Body Dressing: Maximal assistance Where Assessed-Lower  Body Dressing: Wheelchair, Standing at sink, Sitting at sink Toileting: Moderate assistance Where Assessed-Toileting: Glass blower/designer: Moderate assistance  Toilet Transfer Method: Arts development officer: Raised toilet seat Vision Baseline Vision/History: Wears glasses Wears Glasses: Reading only Patient Visual Report: Other (comment) Vision Assessment?: No apparent visual deficits Additional Comments: Reports seeing "zig-zags" since surgery that come and go Perception  Perception: Within Functional Limits Praxis Praxis: Intact Cognition Overall Cognitive Status: Within Functional Limits for tasks assessed Arousal/Alertness: Awake/alert Orientation Level: Person;Place;Situation Person: Oriented Place: Oriented Situation: Oriented Year: 2020 Month: November Day of Week: Correct Memory: Appears intact Immediate Memory Recall: Sock;Blue;Bed Memory Recall Sock: Without Cue Memory Recall Blue: Without Cue Memory Recall Bed: Without Cue Attention: Focused;Sustained Focused Attention: Appears intact Sustained Attention: Appears intact Awareness: Appears intact Problem Solving: Appears intact Safety/Judgment: Appears intact Sensation Sensation Light Touch: Appears Intact Hot/Cold: Appears Intact Proprioception: Appears Intact Stereognosis: Appears Intact Coordination Gross Motor Movements are Fluid and Coordinated: No Fine Motor Movements are Fluid and Coordinated: Yes Coordination and Movement Description: large steps with RLE - pt compensates for hip DJD Motor  Motor Motor - Skilled Clinical Observations: generalized weakness Mobility     Trunk/Postural Assessment  Cervical Assessment Cervical Assessment: Within Functional Limits Thoracic Assessment Thoracic Assessment: Within Functional Limits(sternal precautions) Lumbar Assessment Lumbar Assessment: Within Functional Limits Postural Control Postural Control: Within Functional Limits   Balance Static Sitting Balance Static Sitting - Level of Assistance: 5: Stand by assistance Dynamic Sitting Balance Dynamic Sitting - Level of Assistance: 4: Min assist Sitting balance - Comments: unable to reach to feet to don pants Static Standing Balance Static Standing - Level of Assistance: 4: Min assist Dynamic Standing Balance Dynamic Standing - Level of Assistance: 3: Mod assist Extremity/Trunk Assessment RUE Assessment General Strength Comments: sternal precautions - WFL LUE Assessment General Strength Comments: sternal precautions - WFL     Refer to Care Plan for Long Term Goals  Recommendations for other services: None    Discharge Criteria: Patient will be discharged from OT if patient refuses treatment 3 consecutive times without medical reason, if treatment goals not met, if there is a change in medical status, if patient makes no progress towards goals or if patient is discharged from hospital.  The above assessment, treatment plan, treatment alternatives and goals were discussed and mutually agreed upon: by patient  [facility] 12/22/2018, 12:36 PM

## 2018-12-22 NOTE — Progress Notes (Signed)
[facility] PHYSICAL MEDICINE & REHABILITATION PROGRESS NOTE   Subjective/Complaints:  Having zig zag patterns he's seeing intermittently in his vision- no HA- for the last 2 days or so.  Using ICS regularly, but not every hour.   ROS- denies CP, SOB, abd pain, N/V/D, and HA.  Objective:   Dg Chest 2 View  Result Date: 12/21/2018 CLINICAL DATA:  Hypertension. EXAM: CHEST - 2 VIEW COMPARISON:  December 19, 2018 FINDINGS: Small effusions remain with underlying atelectasis in the left base. The cardiomediastinal silhouette is stable. No pneumothorax. No nodules or masses. No suspicious infiltrates. IMPRESSION: Small bilateral effusions with underlying atelectasis, best seen on the lateral view. No other acute abnormalities. Electronically Signed   By: Dorise Bullion III M.D   On: 12/21/2018 17:35   Recent Labs    12/22/18 0532  WBC 8.3  HGB 9.1*  HCT 28.5*  PLT 227   Recent Labs    12/21/18 0301 12/22/18 0532  NA 144 142  K 3.7 4.1  CL 105 103  CO2 28 27  GLUCOSE 103* 104*  BUN 28* 23  CREATININE 1.06 1.18  CALCIUM 8.1* 8.3*    Intake/Output Summary (Last 24 hours) at 12/22/2018 1249 Last data filed at 12/22/2018 0811 Gross per 24 hour  Intake 200 ml  Output 1450 ml  Net -1250 ml     Physical Exam: Vital Signs Blood pressure 91/70, pulse 97, temperature 98.8 F (37.1 C), temperature source Oral, resp. rate 18, height 6\' 3"  (1.905 m), weight 105.3 kg, SpO2 96 %.  Physical Exam Nursing note, labs, and vitalsreviewed. Constitutional: awake, alert, appropriate, sitting up in bed; is 57ft 3inches, NAD  HENT:  Head:Normocephalicand atraumatic.  Eyes:Pupils are equal, round, and reactive to light.  Neck:Normal range of motion.  Cardiovascular:Normal rateand regular rhythm.  Respiratory:Effort normal. No respiratory distress. He is CTA B/L  Chest wall with staples in place. GI: He exhibitsno distension. There isno abdominal tenderness.   Genitourinary: Genitourinary Comments: Scrotal edema Musculoskeletal:  Comments: RLE weakness with decreased ROM due to right hip pain and min edema right knee. Neurological: He is alertand oriented to person, place, and time. Nocranial nerve deficit.Coordinationnormal.  UE motor 4/5. LE 4- to 4/5 prox to distal. No sensory findings. Cognitively appropriate. Skin:No rashnoted. He is not diaphoretic. Noerythema.  Psychiatric: He has anormal mood and affect.   Assessment/Plan: 1. Functional deficits secondary to debility from CABG x4 which require 3+ hours per day of interdisciplinary therapy in a comprehensive inpatient rehab setting.  Physiatrist is providing close team supervision and 24 hour management of active medical problems listed below.  Physiatrist and rehab team continue to assess barriers to discharge/monitor patient progress toward functional and medical goals  Care Tool:  Bathing    Body parts bathed by patient: Right arm, Left arm, Chest, Abdomen, Front perineal area, Right upper leg, Left upper leg, Face   Body parts bathed by helper: Buttocks, Right lower leg, Left lower leg     Bathing assist Assist Level: Moderate Assistance - Patient 50 - 74%     Upper Body Dressing/Undressing Upper body dressing   What is the patient wearing?: Pull over shirt    Upper body assist Assist Level: Set up assist    Lower Body Dressing/Undressing Lower body dressing      What is the patient wearing?: Underwear/pull up, Pants     Lower body assist Assist for lower body dressing: Maximal Assistance - Patient 25 - 49%     Toileting  Toileting    Toileting assist Assist for toileting: Moderate Assistance - Patient 50 - 74%     Transfers Chair/bed transfer  Transfers assist     Chair/bed transfer assist level: Moderate Assistance - Patient 50 - 74%     Locomotion Ambulation   Ambulation assist              Walk 10 feet  activity   Assist           Walk 50 feet activity   Assist           Walk 150 feet activity   Assist           Walk 10 feet on uneven surface  activity   Assist           Wheelchair     Assist               Wheelchair 50 feet with 2 turns activity    Assist            Wheelchair 150 feet activity     Assist          Blood pressure 91/70, pulse 97, temperature 98.8 F (37.1 C), temperature source Oral, resp. rate 18, height 6\' 3"  (1.905 m), weight 105.3 kg, SpO2 96 %.  Medical Problem List and Plan: 1.Functional and mobility deficitssecondary to CABG x4 and associated post-op complications with subsequent debility -patient may shower -ELOS/Goals: 7-11 days, mod I goals 2. Antithrombotics: -DVT/anticoagulation:Mechanical:Sequential compression devices, below kneeBilateral lower extremities -antiplatelet therapy: ASA/PLavix 3.Chronic right hip pain/Pain Management:tylenol prn. Has not used any oxycodone in 48 hrs--continue prn.  4. Mood:LCSW to follow for evaluation and support. -antipsychotic agents: N/A 5. Neuropsych: This patientiscapable of making decisions onhisown behalf. 6. Skin/Wound Care:Monitor incisions for healing. Continue MVI. Add protein supplement.Per CVTA- pacing sutures to come out Monday and staples to be d/c 12/1. 7. Fluids/Electrolytes/Nutrition:Strict I/O. Check lytes in am.  8. Acute on chronic systolic CHF: TEDs for peripheral edema.   ON ASA,Lipitor,coreg bid, lanoxin, lasix 40 mg daily and Spironolactone to 25 mg on 11/27             -check daily weights   Filed Weights   12/22/18 0419  Weight: 105.3 kg   11/28- will monitor for trends and monitor daily 9. ABLA: Recheck in am. Continue iron supplement.  10. Pre-renal azotemia: stable 34-->28. Will monitor for now--recheck labs tomorrow  11/28- BUN 23- improved 11.  Thrombocytopenia: Resolving.  11/28- Plts in 220s       LOS: 1 days A FACE TO FACE EVALUATION WAS PERFORMED  Lysbeth Dicola 12/22/2018, 12:49 PM

## 2018-12-22 NOTE — Progress Notes (Signed)
Patient transferred from 4W to 4MW11 alert and oriented patient per wheelchair accompanied by LPN/ RN.

## 2018-12-23 NOTE — Progress Notes (Signed)
[facility] PHYSICAL MEDICINE & REHABILITATION PROGRESS NOTE   Subjective/Complaints:  Having visual disturbances still with zig zag impressions in visual fields- less than was as of yesterday but still intermittent.  No associated HA  ROS- denies CP, SOB, abd pain, N/V/D, and HA.  Objective:   Dg Chest 2 View  Result Date: 12/21/2018 CLINICAL DATA:  Hypertension. EXAM: CHEST - 2 VIEW COMPARISON:  December 19, 2018 FINDINGS: Small effusions remain with underlying atelectasis in the left base. The cardiomediastinal silhouette is stable. No pneumothorax. No nodules or masses. No suspicious infiltrates. IMPRESSION: Small bilateral effusions with underlying atelectasis, best seen on the lateral view. No other acute abnormalities. Electronically Signed   By: Dorise Bullion III M.D   On: 12/21/2018 17:35   Recent Labs    12/22/18 0532  WBC 8.3  HGB 9.1*  HCT 28.5*  PLT 227   Recent Labs    12/21/18 0301 12/22/18 0532  NA 144 142  K 3.7 4.1  CL 105 103  CO2 28 27  GLUCOSE 103* 104*  BUN 28* 23  CREATININE 1.06 1.18  CALCIUM 8.1* 8.3*    Intake/Output Summary (Last 24 hours) at 12/23/2018 1130 Last data filed at 12/23/2018 1046 Gross per 24 hour  Intake 820 ml  Output 625 ml  Net 195 ml     Physical Exam: Vital Signs Blood pressure (!) 104/57, pulse 66, temperature 97.9 F (36.6 C), resp. rate 16, height 6\' 3"  (1.905 m), weight 105.8 kg, SpO2 97 %.  Physical Exam Nursing note, labs, and vitalsreviewed. Constitutional: awake, alert, appropriate, sitting up in bed; watching TV NAD  HENT:  Head:Normocephalicand atraumatic.  Eyes:EOMI B/L; conjugate gaze  Neck:Normal range of motion.  Cardiovascular:Normal rateand regular rhythm.  Respiratory:Effort normal. No respiratory distress. He is CTA B/L  Chest wall with staples in place. GI: He exhibitsno distension. There isno abdominal tenderness.  Genitourinary: Genitourinary Comments: Scrotal  edema Musculoskeletal:  Comments: RLE weakness with decreased ROM due to right hip pain and min edema right knee. Neurological: He is alertand oriented to person, place, and time. Nocranial nerve deficit.Coordinationnormal.  UE motor 4/5. LE 4- to 4/5 prox to distal. No sensory findings. Cognitively appropriate. Skin:No rashnoted. He is not diaphoretic. Noerythema.  Psychiatric: He has anormal mood and affect.   Assessment/Plan: 1. Functional deficits secondary to debility from CABG x4 which require 3+ hours per day of interdisciplinary therapy in a comprehensive inpatient rehab setting.  Physiatrist is providing close team supervision and 24 hour management of active medical problems listed below.  Physiatrist and rehab team continue to assess barriers to discharge/monitor patient progress toward functional and medical goals  Care Tool:  Bathing    Body parts bathed by patient: Right arm, Left arm, Chest, Abdomen, Front perineal area, Right upper leg, Left upper leg, Face   Body parts bathed by helper: Buttocks, Right lower leg, Left lower leg     Bathing assist Assist Level: Moderate Assistance - Patient 50 - 74%     Upper Body Dressing/Undressing Upper body dressing   What is the patient wearing?: Pull over shirt    Upper body assist Assist Level: Set up assist    Lower Body Dressing/Undressing Lower body dressing      What is the patient wearing?: Underwear/pull up, Pants     Lower body assist Assist for lower body dressing: Independent     Toileting Toileting    Toileting assist Assist for toileting: Supervision/Verbal cueing     Transfers Chair/bed  transfer  Transfers assist     Chair/bed transfer assist level: Moderate Assistance - Patient 50 - 74%     Locomotion Ambulation   Ambulation assist      Assist level: Contact Guard/Touching assist Assistive device: Walker-rolling Max distance: 25'   Walk 10 feet  activity   Assist     Assist level: Contact Guard/Touching assist Assistive device: Walker-rolling   Walk 50 feet activity   Assist Walk 50 feet with 2 turns activity did not occur: Safety/medical concerns(decreased strength/activity tolerance)         Walk 150 feet activity   Assist Walk 150 feet activity did not occur: Safety/medical concerns(decreased strength/activity tolerance)         Walk 10 feet on uneven surface  activity   Assist Walk 10 feet on uneven surfaces activity did not occur: Safety/medical concerns(decreased strength/activity tolerance)         Wheelchair     Assist     Wheelchair activity did not occur: Safety/medical concerns(sternal precautions)         Wheelchair 50 feet with 2 turns activity    Assist    Wheelchair 50 feet with 2 turns activity did not occur: Safety/medical concerns(sternal precautions)       Wheelchair 150 feet activity     Assist  Wheelchair 150 feet activity did not occur: Safety/medical concerns(sternal precautions)       Blood pressure (!) 104/57, pulse 66, temperature 97.9 F (36.6 C), resp. rate 16, height 6\' 3"  (1.905 m), weight 105.8 kg, SpO2 97 %.  Medical Problem List and Plan: 1.Functional and mobility deficitssecondary to CABG x4 and associated post-op complications with subsequent debility -patient may shower -ELOS/Goals: 7-11 days, mod I goals 2. Antithrombotics: -DVT/anticoagulation:Mechanical:Sequential compression devices, below kneeBilateral lower extremities -antiplatelet therapy: ASA/PLavix 3.Chronic right hip pain/Pain Management:tylenol prn. Has not used any oxycodone in 48 hrs--continue prn.  4. Mood:LCSW to follow for evaluation and support. -antipsychotic agents: N/A 5. Neuropsych: This patientiscapable of making decisions onhisown behalf. 6. Skin/Wound Care:Monitor incisions for healing. Continue  MVI. Add protein supplement.Per CVTA- pacing sutures to come out Monday and staples to be d/c 12/1. 7. Fluids/Electrolytes/Nutrition:Strict I/O. Check lytes in am.  8. Acute on chronic systolic CHF: TEDs for peripheral edema.   ON ASA,Lipitor,coreg bid, lanoxin, lasix 40 mg daily and Spironolactone to 25 mg on 11/27             -check daily weights   Filed Weights   12/22/18 0419 12/23/18 0624  Weight: 105.3 kg 105.8 kg   11/29- will monitor for trends and monitor daily 9. ABLA: Recheck in am. Continue iron supplement.  10. Pre-renal azotemia: stable 34-->28. Will monitor for now--recheck labs tomorrow  11/28- BUN 23- improved 11. Thrombocytopenia: Resolving.  11/28- Plts in 220s       LOS: 2 days A FACE TO FACE EVALUATION WAS PERFORMED  Kamica Florance 12/23/2018, 11:30 AM

## 2018-12-23 NOTE — IPOC Note (Signed)
Overall Plan of Care [facility]) Patient Details Name: Frank Love MRN: [id number] DOB: 12-04-1955  Admitting Diagnosis: Debility  Hospital Problems: Principal Problem:   Debility Active Problems:   Chronic systolic HF (heart failure) (HCC)   Coronary artery disease involving native coronary artery of native heart without angina pectoris   S/P CABG x 4     Functional Problem List: Nursing Medication Management, Edema, Endurance, Safety  PT Balance, Edema, Endurance, Motor, Nutrition, Pain, Perception, Safety, Skin Integrity  OT Balance, Endurance, Motor  SLP    TR         Basic ADL's: OT Bathing, Dressing, Toileting     Advanced  ADL's: OT       Transfers: PT Bed Mobility, Bed to Chair, Car, Furniture, Floor  OT Toilet, Metallurgist: PT Ambulation, Emergency planning/management officer, Stairs     Additional Impairments: OT None  SLP        TR      Anticipated Outcomes Item Anticipated Outcome  Self Feeding no goal, pt is Independent  Swallowing      Basic self-care  S  Toileting  S   Bathroom Transfers S  Bowel/Bladder  n/a  Transfers  mod I with LRAD  Locomotion  supervision 100 feet with LRAD  Communication     Cognition     Pain  n;/a  Safety/Judgment  maintain with cues/reminders   Therapy Plan: PT Intensity: Minimum of 1-2 x/day ,45 to 90 minutes PT Frequency: 5 out of 7 days PT Duration Estimated Length of Stay: 8-12 days OT Intensity: Minimum of 1-2 x/day, 45 to 90 minutes OT Frequency: 5 out of 7 days OT Duration/Estimated Length of Stay: 10-12 days     Due to the current state of emergency, patients may not be receiving their 3-hours of Medicare-mandated therapy.   Team Interventions: Nursing Interventions Patient/Family Education, Skin Care/Wound Management, Discharge Planning, Medication Management, Disease Management/Prevention  PT interventions Ambulation/gait training, Discharge planning, Functional mobility training,  Psychosocial support, Therapeutic Activities, Visual/perceptual remediation/compensation, Balance/vestibular training, Disease management/prevention, Neuromuscular re-education, Skin care/wound management, Therapeutic Exercise, Wheelchair propulsion/positioning, DME/adaptive equipment instruction, Pain management, Splinting/orthotics, UE/LE Strength taining/ROM, Community reintegration, Technical sales engineer stimulation, Patient/family education, IT trainer, UE/LE Coordination activities  OT Interventions Training and development officer, Discharge planning, DME/adaptive equipment instruction, Functional mobility training, Psychosocial support, Patient/family education, Self Care/advanced ADL retraining, Therapeutic Activities, UE/LE Strength taining/ROM, Therapeutic Exercise  SLP Interventions    TR Interventions    SW/CM Interventions Discharge Planning, Psychosocial Support, Patient/Family Education   Barriers to Discharge MD  Medical stability, Home enviroment access/loayout, Wound care, Lack of/limited family support, Weight and Weight bearing restrictions  Nursing      PT Home environment access/layout, Lack of/limited family support Patient has 9 STE his 1 level apartment and his wife can only provide supervision at d/c  OT Home environment access/layout several stairs to enter home  SLP      SW       Team Discharge Planning: Destination: PT-Home ,OT- Home , SLP-  Projected Follow-up: PT-Home health PT, OT-  Home health OT, SLP-  Projected Equipment Needs: PT-To be determined, OT- Tub/shower bench, To be determined, SLP-  Equipment Details: PT-Patient has a SPC, OT-  Patient/family involved in discharge planning: PT- Patient,  OT-Patient, SLP-   MD ELOS: 10-12 days Medical Rehab Prognosis:  Excellent Assessment: Pt is a 63 yr old male with chronic R hip pain on pain meds s/p CABG x4 with sternal precautions, acute on chronic Systolic  CHF- checking daily weights, ABLA; and azotemia  that's improving.  Goals are supervision to Mod I in 10-12 days  See Team Conference Notes for weekly updates to the plan of care

## 2018-12-24 ENCOUNTER — Inpatient Hospital Stay (HOSPITAL_COMMUNITY): Payer: No Typology Code available for payment source

## 2018-12-24 DIAGNOSIS — I5022 Chronic systolic (congestive) heart failure: Secondary | ICD-10-CM

## 2018-12-24 LAB — CBC
HCT: 28.1 % — ABNORMAL LOW (ref 39.0–52.0)
Hemoglobin: 9 g/dL — ABNORMAL LOW (ref 13.0–17.0)
MCH: 30 pg (ref 26.0–34.0)
MCHC: 32 g/dL (ref 30.0–36.0)
MCV: 93.7 fL (ref 80.0–100.0)
Platelets: 201 10*3/uL (ref 150–400)
RBC: 3 MIL/uL — ABNORMAL LOW (ref 4.22–5.81)
RDW: 14.2 % (ref 11.5–15.5)
WBC: 7.8 10*3/uL (ref 4.0–10.5)
nRBC: 0 % (ref 0.0–0.2)

## 2018-12-24 LAB — BASIC METABOLIC PANEL
Anion gap: 11 (ref 5–15)
BUN: 25 mg/dL — ABNORMAL HIGH (ref 8–23)
CO2: 24 mmol/L (ref 22–32)
Calcium: 8.4 mg/dL — ABNORMAL LOW (ref 8.9–10.3)
Chloride: 105 mmol/L (ref 98–111)
Creatinine, Ser: 1.21 mg/dL (ref 0.61–1.24)
GFR calc Af Amer: >60 mL/min (ref >60)
GFR calc non Af Amer: >60 mL/min (ref >60)
Glucose, Bld: 115 mg/dL — ABNORMAL HIGH (ref 70–99)
Potassium: 3.9 mmol/L (ref 3.5–5.1)
Sodium: 140 mmol/L (ref 135–145)

## 2018-12-24 MED ORDER — LOSARTAN POTASSIUM 25 MG PO TABS
12.5000 mg | ORAL_TABLET | Freq: Every day | ORAL | Status: DC
  Administered 2018-12-24 – 2018-12-27 (×4): 12.5 mg via ORAL
  Filled 2018-12-24 (×4): qty 1

## 2018-12-24 MED FILL — ?SPIRONOLACTONE 25 MG TABLE: 25 | 30 days supply | Qty: 30 | Fill #0

## 2018-12-24 MED FILL — ?ONDANSETRON 4 MG TBDP: 4 | 5 days supply | Qty: 20 | Fill #0

## 2018-12-24 MED FILL — PROMETHAZINE 12.5 MG TABLET: 12.5 | 15 days supply | Qty: 30 | Fill #0

## 2018-12-24 NOTE — Plan of Care (Signed)
  Problem: Consults Goal: RH SPINAL CORD INJURY PATIENT EDUCATION Description:  See Patient Education module for education specifics.  Outcome: Progressing   Problem: SCI BOWEL ELIMINATION Goal: RH STG MANAGE BOWEL WITH ASSISTANCE Description: STG Manage Bowel with  mod I Assistance. Outcome: Progressing Goal: RH STG SCI MANAGE BOWEL WITH MEDICATION WITH ASSISTANCE Description: STG SCI Manage bowel with medication with  mod I assistance. Outcome: Progressing   Problem: SCI BLADDER ELIMINATION Goal: RH STG MANAGE BLADDER WITH ASSISTANCE Description: STG Manage Bladder With mod I Assistance Outcome: Progressing Goal: RH STG MANAGE BLADDER WITH EQUIPMENT WITH ASSISTANCE Description: STG Manage Bladder With Equipment With mod I Assistance Outcome: Progressing   Problem: RH SKIN INTEGRITY Goal: RH STG SKIN FREE OF INFECTION/BREAKDOWN Description: With mod I assist Outcome: Progressing Goal: RH STG MAINTAIN SKIN INTEGRITY WITH ASSISTANCE Description: STG Maintain Skin Integrity With  mod I Assistance. Outcome: Progressing Goal: RH STG ABLE TO PERFORM INCISION/WOUND CARE W/ASSISTANCE Description: STG Able To Perform Incision/Wound Care With  min Assistance. Outcome: Progressing   Problem: RH SAFETY Goal: RH STG ADHERE TO SAFETY PRECAUTIONS W/ASSISTANCE/DEVICE Description: STG Adhere to Safety Precautions With cues/reminders  Assistance/Device. Outcome: Progressing   Problem: RH PAIN MANAGEMENT Goal: RH STG PAIN MANAGED AT OR BELOW PT'S PAIN GOAL Description: At or below level 5 Outcome: Progressing   Problem: RH KNOWLEDGE DEFICIT SCI Goal: RH STG INCREASE KNOWLEDGE OF SELF CARE AFTER SCI Description: Patient and wife will be able to manage care at discharge independently using handouts and educational materials  Outcome: Progressing   Problem: Consults Goal: RH GENERAL PATIENT EDUCATION Description: See Patient Education module for education specifics. Outcome:  Progressing Goal: Skin Care Protocol Initiated - if Braden Score 18 or less Description: If consults are not indicated, leave blank or document N/A Outcome: Progressing Goal: Nutrition Consult-if indicated Outcome: Progressing   Problem: RH BOWEL ELIMINATION Goal: RH STG MANAGE BOWEL WITH ASSISTANCE Description: STG Manage Bowel with  min Assistance. Outcome: Progressing Goal: RH STG MANAGE BOWEL W/MEDICATION W/ASSISTANCE Description: STG Manage Bowel with Medication with Assistance. Outcome: Progressing   Problem: RH BLADDER ELIMINATION Goal: RH STG MANAGE BLADDER WITH ASSISTANCE Description: STG Manage Bladder With min Assistance Outcome: Progressing   Problem: RH SKIN INTEGRITY Goal: RH STG SKIN FREE OF INFECTION/BREAKDOWN Description: Skin free from infection entire stay on rehab Outcome: Progressing   Problem: RH SAFETY Goal: RH STG ADHERE TO SAFETY PRECAUTIONS W/ASSISTANCE/DEVICE Description: STG Adhere to Safety Precautions With Assistance/Device. Outcome: Progressing   Problem: RH PAIN MANAGEMENT Goal: RH STG PAIN MANAGED AT OR BELOW PT'S PAIN GOAL Description: Pain less than 2 Outcome: Progressing

## 2018-12-24 NOTE — Progress Notes (Addendum)
Patient ID: Frank Love, male   DOB: 02-24-1955, 63 y.o.   MRN: [id number]     Advanced Heart Failure Rounding Note  PCP-Cardiologist: Frank Breeding, MD   Subjective:    Denies SOB. Says he is intermittently seeing small "floaters". Says he has had this in past and it went away spontaneously. Denies dizziness.    Objective:   Weight Range: 106.6 kg Body mass index is 29.37 kg/m.   Vital Signs:   Temp:  [98.1 F (36.7 C)-98.7 F (37.1 C)] 98.6 F (37 C) (11/30 0528) Pulse Rate:  [62-70] 64 (11/30 0808) Resp:  [16-19] 17 (11/30 0528) BP: (106-111)/(56-59) 106/59 (11/30 0807) SpO2:  [96 %-99 %] 98 % (11/30 0807) Weight:  [106.6 kg] 106.6 kg (11/30 0528) Last BM Date: 12/23/18  Weight change: Filed Weights   12/22/18 0419 12/23/18 0624 12/24/18 0528  Weight: 105.3 kg 105.8 kg 106.6 kg    Intake/Output:   Intake/Output Summary (Last 24 hours) at 12/24/2018 0819 Last data filed at 12/23/2018 1850 Gross per 24 hour  Intake 720 ml  Output 1225 ml  Net -505 ml      Physical Exam   General:  Sitting on the side of the bed. No resp difficulty HEENT: normal Neck: supple. no JVD. Carotids 2+ bilat; no bruits. No lymphadenopathy or thryomegaly appreciated. Cor: PMI nondisplaced. Regular rate & rhythm. No rubs, gallops or murmurs. Sternal incision approximated.  Lungs: clear on room air.  Abdomen: soft, nontender, nondistended. No hepatosplenomegaly. No bruits or masses. Good bowel sounds. Extremities: no cyanosis, clubbing, rash, edema Neuro: alert & orientedx3, cranial nerves grossly intact. moves all 4 extremities w/o difficulty. Affect pleasant   Labs    CBC Recent Labs    12/22/18 0532 12/24/18 0713  WBC 8.3 7.8  NEUTROABS 5.7  --   HGB 9.1* 9.0*  HCT 28.5* 28.1*  MCV 92.8 93.7  PLT 227 [id number]   Basic Metabolic Panel Recent Labs    12/22/18 0532  NA 142  K 4.1  CL 103  CO2 27  GLUCOSE 104*  BUN 23  CREATININE 1.18  CALCIUM 8.3*   Liver  Function Tests Recent Labs    12/22/18 0532  AST 22  ALT 22  ALKPHOS 57  BILITOT 1.3*  PROT 5.6*  ALBUMIN 2.8*   No results for input(s): LIPASE, AMYLASE in the last 72 hours. Cardiac Enzymes No results for input(s): CKTOTAL, CKMB, CKMBINDEX, TROPONINI in the last 72 hours.  BNP: BNP (last 3 results) Recent Labs    07/06/18 1017 07/09/18 1238 08/03/18 1024  BNP 1,721.2* 1,077.6* 316.2*    ProBNP (last 3 results) No results for input(s): PROBNP in the last 8760 hours.   D-Dimer No results for input(s): DDIMER in the last 72 hours. Hemoglobin A1C No results for input(s): HGBA1C in the last 72 hours. Fasting Lipid Panel No results for input(s): CHOL, HDL, LDLCALC, TRIG, CHOLHDL, LDLDIRECT in the last 72 hours. Thyroid Function Tests No results for input(s): TSH, T4TOTAL, T3FREE, THYROIDAB in the last 72 hours.  Invalid input(s): FREET3  Other results:   Imaging    No results found.   Medications:     Scheduled Medications: . aspirin EC  81 mg Oral Daily  . atorvastatin  40 mg Oral q1800  . carvedilol  3.125 mg Oral BID WC  . clopidogrel  75 mg Oral Daily  . colchicine  0.6 mg Oral Daily  . digoxin  0.125 mg Oral Daily  . ferrous [id number] C-folic  acid  1 capsule Oral BID PC  . furosemide  40 mg Oral Daily  . multivitamins with iron  1 tablet Oral Daily  . pantoprazole  20 mg Oral Daily  . potassium chloride  20 mEq Oral Daily  . senna-docusate  2 tablet Oral QHS  . spironolactone  25 mg Oral Daily    Infusions:   PRN Medications: acetaminophen, alum & mag hydroxide-simeth, bisacodyl, diphenhydrAMINE, guaiFENesin-dextromethorphan, nitroGLYCERIN, oxyCODONE, polyethylene glycol, prochlorperazine **OR** prochlorperazine **OR** prochlorperazine, sodium phosphate, traMADol, traZODone, white petrolatum   Assessment/Plan   1. CAD: S/p CABG.   - Continue ASA 81 + Plavix.  - Continue statin.  2. Acute on chronic systolic CHF: Ischemic  cardiomyopathy.  Post-op echo was reviewed, showed EF 30-35%, moderate-severe RV dysfunction.  RV function worse post-op, V/Q scan not suggestive of PE. - Volume status stable on Lasix 40 mg daily.   Off midodrine.   - Add 12.5 mg losartan daily at bed time.  - Continue spironolactone 12.5 daily.  - Continue digoxin 0.125 daily.  - Continue Coreg 3.125 mg bid.  - Check  BMET in am.  3. Anemia: Post-op blood loss.  4. Deconditioning: Aggressive therapy ongoing .   We will set up follow up HF clinic.   Length of Stay: 3  Frank Clegg, NP  12/24/2018, 8:19 AM  Advanced Heart Failure Team Pager [phone] (M-F; 7a - 4p)  Please contact [facility] for night-coverage after hours (4p -7a ) and weekends on [email or web address]  Patient seen with NP, agree with the above note.   Doing well in rehab.  No significant dyspnea.  Volume status looks ok.  We will start him on losartan 12.5 mg qhs and continue the rest of his cardiac regimen.  Probably no further medication titration for now with soft BP.  We will sign off but will arrange followup in CHF clinic.   Frank Love 12/24/2018 8:43 AM

## 2018-12-24 NOTE — Progress Notes (Signed)
Inpatient Rehabilitation  Patient information reviewed and entered into eRehab system by Asjah Rauda M. Conroy Goracke, M.A., CCC/SLP, PPS Coordinator.  Information including medical coding, functional ability and quality indicators will be reviewed and updated through discharge.    

## 2018-12-24 NOTE — Progress Notes (Signed)
Occupational Therapy Session Note  Patient Details  Name: Frank Love MRN: [id number] Date of Birth: 30-Aug-1955  Today's Date: 12/24/2018 OT Individual Time: [id number] OT Individual Time Calculation (min): 70 min    Short Term Goals: Week 1:  OT Short Term Goal 1 (Week 1): Pt will be able to don pants over his feet with S with or without AE. OT Short Term Goal 2 (Week 1): Pt will be able to pull pants up/down over hips with S in standing. OT Short Term Goal 3 (Week 1): Pt will be able to transfer to toilet with RW with CGA. OT Short Term Goal 4 (Week 1): pt will be able to rise to stand from elevated toilet with CGA.  Skilled Therapeutic Interventions/Progress Updates:    Pt standing at recliner upon arrival with NT present.  Pt performing peri hygiene after using hygiene. Pt declined bathing/dressing this morning.  OT intervention with focus on functional amb with RW, sit<>stand following sternal precautions, tub bench transfers, bed mobility, DME recommendations, and discharge planning to increase independence with BADLs.  Reviewed OT goals.  Sit<>stand with min A from lower surfaces and CGA on slightly higher surfaces. Pt practiced tub bench transfers and pt given info on equipment in addition info on BSC. Pt practiced bed mobility with CGA. Pt states he has been sleeping in standard chair with ottoman for BLE but would like to return to sleeping in bed.  Pt amb with RW in bathroom and ADL apartment with CGA. Pt returned to room and requested to get back in bed.  Pt remained in bed with all needs within reach and bed alarm activated.   Therapy Documentation Precautions:  Precautions Precautions: Fall, Sternal Precaution Comments: right hip DJD Restrictions Weight Bearing Restrictions: Yes RLE Weight Bearing: Weight bearing as tolerated Other Position/Activity Restrictions: sternal precautions  Pain:  Pt denies pain this morning   Therapy/Group: Individual Therapy  Leroy Libman 12/24/2018, 10:56 AM

## 2018-12-24 NOTE — Progress Notes (Addendum)
Physical Therapy Session Note  Patient Details  Name: Frank Love MRN: [id number] Date of Birth: 02-24-1955  Today's Date: 12/24/2018 PT Individual Time: 0800-0900 and  1420-1505 PT Individual Time Calculation (min): 60 min and 45 min   Short Term Goals: Week 1:  PT Short Term Goal 1 (Week 1): STG=LTG due to short ELOS.  Skilled Therapeutic Interventions/Progress Updates:     Session 1: Patient in bed upon PT arrival. Patient alert and agreeable to PT session. Patient denied pain during session. Reported that he had a difficult time sleeping last night.   Therapeutic Activity: Bed Mobility: Patient performed supine to sit with min A for trunk support in a flat bed without use of bed rails. Provided verbal cues for sliding LEs off the bed then using his abdominals to come to sitting. Transfers: Patient performed sit to/from stand x4 and stand pivot x1 with min A for forward weight shift without use of UEs using a RW to maintain sternal precautions. Provided verbal cues for forward weight shift and scooting forward in the chair prior to standing.  Gait Training:  Patient ambulated 10 feet forwards and backwards and 60 feet and 12 feet forwards using RW with CGA for safety. Ambulated with R hip abducted with wide BOS and increased B hip and knee flexion with very small step length and decreased foot clearance. Provided verbal cues for erect posture, narrow BOS, and increased step length.  Wheelchair Mobility:  Patient was transported in the w/c with total A throughout session for energy conservation, to maintain sternal precautions, and time management.  Neuromuscular Re-ed: Patient performed standing balance working on decreased BOS and equal weight bearing 2x2 min and weight shifts to promote R LE weight bearing 1x2 min.   Patient in recliner in the room at end of session with breaks locked, chair alarm set, and all needs within reach. Educated on [place] and goals for PT throughout  session.   Session 2: Patient in bed upon PT arrival. Patient alert and agreeable to PT session. Patient denied pain during session, however, reported R LE soreness from previous session. Patient also stated that he is very tired this afternoon from not sleeping well last night.   Therapeutic Activity: Bed Mobility: Patient performed supine to sit with min A for trunk support and sit to supine with supervision for safety in a flat bed without use of bed rails. He also performed rolling to L side-lying with min A Provided verbal cues for bending LEs and rolling both LEs to the side while crossing R arm over and turning his head down to roll. Transfers: Patient performed sit to/from stand x1 and toilet transfer with BSC over the toilet x1 with min A for forward weight shift. Provided verbal cues as above. Required supervision for peri-care and LB dressing during toileting.  Gait Training:  Patient ambulated 12 feet to/from the bathrrom using RW with close supervision-CGA for safety. Ambulated as above.   Therapeutic Exercise: Patient performed the following exercises with verbal and tactile cues for proper technique. -bridging 2x8 -B SLR L 2x10 R 4x5 -B isometric hip adduction 2x10 with 5 sec holds -B hip abduction with orange band 2x10 -B glut sets 2x10 with 5 sec holds  Patient in reported increased fatigue and asked to end session on time rather than running over due to PT being late. Patient missed 15 min of skilled PT due to fatigue. He was in bed at end of session with breaks locked, bed alarm set,  and all needs within reach.    Therapy Documentation Precautions:  Precautions Precautions: Fall, Sternal Precaution Comments: right hip DJD Restrictions Weight Bearing Restrictions: Yes RLE Weight Bearing: Weight bearing as tolerated Other Position/Activity Restrictions: sternal precautions  General: PT Amount of Missed Time (min): 15 Minutes PT Missed Treatment Reason: Patient  fatigue    Therapy/Group: Individual Therapy  Samaiya Awadallah L Janiylah Hannis PT, DPT  12/24/2018, 3:37 PM

## 2018-12-24 NOTE — Progress Notes (Signed)
Social Work Assessment and Plan   Patient Details  Name: Frank Love MRN: [id number] Date of Birth: 05/26/1955  Today's Date: 12/24/2018  Problem List:  Patient Active Problem List   Diagnosis Date Noted  . Debility 12/21/2018  . 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 Coronary artery disease 12/12/2018  . Coronary artery disease involving native coronary artery of native heart without angina pectoris   . Ischemic cardiomyopathy   . Chronic systolic HF (heart failure) ([place]) 10/28/2018  . Preop cardiovascular exam 10/28/2018  . Abnormal EKG 10/28/2018   Past Medical History:  Past Medical History:  Diagnosis Date  . Chronic pain of right knee   . Coronary artery disease   . Hypertension   . Phimosis    Past Surgical History:  Past Surgical History:  Procedure Laterality Date  . CIRCUMCISION N/A 10/12/2018   Procedure: CIRCUMCISION ADULT;  Surgeon: Franchot Gallo, MD;  Location: [facility];  Service: Urology;  Laterality: N/A;  45 mins  . COLONOSCOPY    . CORONARY ARTERY BYPASS GRAFT N/A 12/12/2018   Procedure: CORONARY ARTERY BYPASS GRAFTING (CABG) x four, using bilateral internal mammary arteries and right leg greater saphenous vein harvested endoscopically;  Surgeon: [name], MD;  Location: [place];  Service: Open Heart Surgery;  Laterality: N/A;  . LEFT HEART CATH AND CORONARY ANGIOGRAPHY N/A 11/28/2018   Procedure: LEFT HEART CATH AND CORONARY ANGIOGRAPHY;  Surgeon: Burnell Blanks, MD;  Location: [place] [facility];  Service: Cardiovascular;  Laterality: N/A;  . MEDIASTINAL EXPLORATION N/A 12/12/2018   Procedure: Mediastinal Re-Exploration after bypass graft;  Surgeon: [name], MD;  Location: [place];  Service: Open Heart Surgery;  Laterality: N/A;  . TEE WITHOUT CARDIOVERSION N/A 12/12/2018   Procedure: TRANSESOPHAGEAL ECHOCARDIOGRAM (TEE);  Surgeon: [name], MD;  Location: [place];  Service: Open Heart Surgery;  Laterality: N/A;  . TOOTH  EXTRACTION     Social History:  reports that he has never smoked. He has never used smokeless tobacco. He reports that he does not drink alcohol or use drugs.  Family / Support Systems Marital Status: Married Patient Roles: Spouse Spouse/Significant Other: wife, Frank Love @ [phone] Children: adult daughter, Frank Love @ [phone] Anticipated Caregiver: wife Ability/Limitations of Caregiver: supervision Caregiver Availability: 24/7 Family Dynamics: pt notes wife is very supportive and denies any concerns about assist at home.  Social History Preferred language: English Religion: [employer] Cultural Background: NA Read: Yes Employment Status: Employed Return to Work Plans: Pt hopeful he might be able to return, however, concerned due to level of physical acitivity it requires Legal History/Current Legal Issues: None Guardian/Conservator: None - per MD, pt is capable of making decisions on his own behalf.   Abuse/Neglect Abuse/Neglect Assessment Can Be Completed: Yes Physical Abuse: Denies Verbal Abuse: Denies Sexual Abuse: Denies Exploitation of patient/patient's resources: Denies Self-Neglect: Denies  Emotional Status Pt's affect, behavior and adjustment status: Pt sitting up in w/c and completes assessment interview without any difficulty.  Denies any emotional distress - will monitor. Recent Psychosocial Issues: None Psychiatric History: None Substance Abuse History: None  Patient / Family Perceptions, Expectations & Goals Pt/Family understanding of illness & functional limitations: Pt and wife with good, general understanding of surgery performed and current functional limitations/ need for CIR. Premorbid pt/family roles/activities: Pt was independent, however, mobility was declining. Anticipated changes in roles/activities/participation: little change anticipated if pt able to reach supervision goals Pt/family expectations/goals: Pt and wife hopeful he can regain some level  of  independence.  Community Resources [employer]: None Premorbid Home Care/DME Agencies: None Transportation available at discharge: yes  Discharge Planning Living Arrangements: Spouse/significant other Support Systems: Spouse/significant other, Children Type of Residence: Private residence [employer] Resources: Teacher, adult education Resources: Employment Financial Screen Referred: Previously completed Living Expenses: Education officer, community Management: Patient Does the patient have any problems obtaining your medications?: No Home Management: pt and wife share responsibilities Patient/Family Preliminary Plans: Pt to return home with spouse providing any needed assistance Social Work Anticipated Follow Up Needs: HH/OP Expected length of stay: 8-10 days  Clinical Impression Pleasant gentleman here for debility following CABG.  Good support from wife who can provide 24/7 assist.  Denies any emotional distress, however, will monitor.  Follow for support and d/c planning.  Frank Love 12/24/2018, 4:47 PM

## 2018-12-25 ENCOUNTER — Inpatient Hospital Stay (HOSPITAL_COMMUNITY): Payer: No Typology Code available for payment source

## 2018-12-25 LAB — DIGOXIN LEVEL: Digoxin Level: 0.4 ng/mL — ABNORMAL LOW (ref 0.8–2.0)

## 2018-12-25 NOTE — Progress Notes (Addendum)
Physical Therapy Session Note  Patient Details  Name: Frank Love MRN: [id number] Date of Birth: 1955-11-15  Today's Date: 12/25/2018 PT Individual Time: 0910-1000 and 1355-1500 PT Individual Time Calculation (min): 50 min   Short Term Goals: Week 1:  PT Short Term Goal 1 (Week 1): STG=LTG due to short ELOS.  Skilled Therapeutic Interventions/Progress Updates:     Session 1: Patient in bed upon PT arrival. PT arrived late to session due to patient care. Patient missed 10 min of skilled PT. Patient alert and agreeable to PT session. Patient reported 5/10 R hip pain with weight bearing during session, RN made aware and provided pain medicine during session. PT provided repositioning, rest breaks, and distraction as pain interventions throughout session.   Therapeutic Activity: Bed Mobility: Patient performed supine to/from sit with min a for trunk support to come to sitting, otherwise supervision for safety. Provided verbal cues for performing side-lying to sitting for increased abdominal activation and reduced UE use to maintain sternal precautions. Transfers: Patient performed sit to/from stand x4 with min A for forward weight shift. Provided verbal cues for forward weight shift and hands on thighs to maintain sternal precautions.  Gait Training:  Patient ambulated 72 feet using RW with close supervision for safety. Ambulated with antalgic gait on R with step-to gait pattern leading with R, wide BOS, and forward trunk lean. Provided verbal cues for erect posture and narrow BOS. Changed patient to a standard size RW, form bariatric RW, to encourage narrow BOS and simulate available home equipment.  He went up/down 8-3" steps using B rails with CGA, then 4-6" steps using B rails with CGA, then 4-6" steps using R rail only with B UE support and side-stepping technique with min A. Performed a step-to gait pattern leading with L while ascending and R while descending on all stair trials.  Provided cues for sequencing, technique, increased weight shift to the R, and light use of UEs to maintain sternal precautions.  Wheelchair Mobility:  Patient was transported in the w/c with total A throughout session for energy conservation and time management.  Therapeutic Exercise: Patient performed the following exercises with verbal and tactile cues for proper technique. Educated on performing exercises when seated at least 3x per day.  -Seated marching 2x20 -B LAQ 2x10 -B seated isometric hip adduction with pillow between knees 2x10 with 5 sec holds   Patient in recliner in room at end of session with breaks locked, chair alarm set, and all needs within reach.   Session 2: Patient in bed upon PT arrival. Patient alert and agreeable to PT session. Patient denied pain during session.   Therapeutic Activity: Bed Mobility: Patient performed supine to/from sit as above.  Transfers: Patient performed sit to/from stand x5 with with min A-CGA using a RW and a furniture transfer from the ADL recliner with 2 pillows in the seat for increased elevation with min A for forward weight shift. Provided verbal cues as above for all transfers.  Gait Training:  Patient ambulated 135 feet x2 using RW with close supervision. Ambulated as above. RPE 5-6/10 and HR 74-76 after, recovered to low 60's <2 min.   Wheelchair Mobility:  Patient was transported in the w/c with total A throughout session for energy conservation and time management.  Patient in bed at end of session with breaks locked, bed alarm set, and all needs within reach. Discussed ELOS and PT goals at end of session.      Therapy Documentation Precautions:  Precautions  Precautions: Fall, Sternal Precaution Comments: right hip DJD Restrictions Weight Bearing Restrictions: Yes RLE Weight Bearing: Weight bearing as tolerated Other Position/Activity Restrictions: sternal precautions     Therapy/Group: Individual Therapy  [name]  L Skyelyn Scruggs PT, DPT  12/25/2018, 4:17 PM

## 2018-12-25 NOTE — Patient Care Conference (Signed)
Inpatient RehabilitationTeam Conference and Plan of Care Update Date: 12/25/2018   Time: 11:45 AM    Patient Name: Frank Love      Medical Record Number: [id number]  Date of Birth: 10/22/1955 Sex: Male         Room/Bed: 4M11C/4M11C-01 Payor Info: Payor: /    Admit Date/Time:  12/21/2018  2:26 PM  Primary Diagnosis:  Debility  Patient Active Problem List   Diagnosis Date Noted  . Debility 12/21/2018  . 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 Coronary artery disease 12/12/2018  . Coronary artery disease involving native coronary artery of native heart without angina pectoris   . Ischemic cardiomyopathy   . Chronic systolic HF (heart failure) ([place]) 10/28/2018  . Preop cardiovascular exam 10/28/2018  . Abnormal EKG 10/28/2018    Expected Discharge Date: Expected Discharge Date: 12/28/18  Team Members Present: Physician leading conference: Dr. Courtney Heys Social Worker Present: Ovidio Kin, LCSW Nurse Present: Rozetta Nunnery, RN Case Manager: Karene Fry, RN PT Present: Apolinar Junes, PT OT Present: Roanna Epley, [place], OT SLP Present: Weston Anna, SLP PPS Coordinator present : Gunnar Fusi, SLP     Current Status/Progress Goal Weekly Team Focus  Bowel/Bladder   pt continent of B&B, LBM 11/30  remain continent  assess toileting q shift and prn   Swallow/Nutrition/ Hydration             ADL's   functional transfers-min A/CGA; sit<>stand-CGA/min A; LB dressing-min A; bathing-min A;  supervision overall  BADL training; functional tranfsers, safety awareness, educaiton   Mobility   Min A bed mobility and transfers, CGA-supervision gait 70 feet with bariatric RW, total A w/c, unable to perform stairs  Supervision bed mobility transfers and gait 100 feet, CGA 1 step with LRAD and 8 steps with R rail  LE strengthening/ROM, functional mobility, gait and stair training, balance, activity tolerance, patient/caregiver education   Communication              Safety/Cognition/ Behavioral Observations            Pain   pt no c/o pain  remain pain free  assess pain q shift and prn   Skin   chest incision w/ staples (to be removed 12/1), abd sutures removed 11/30, RLE skin glue incision  prevent new/further skin breakdown  assess skin q shift and prn    Rehab Goals Patient on target to meet rehab goals: Yes *See Care Plan and progress notes for long and short-term goals.     Barriers to Discharge  Current Status/Progress Possible Resolutions Date Resolved   Nursing                  PT  Home environment access/layout;Lack of/limited family support  9 STE apartment; patient's wife cane only provide supervision at home  Patient is unable to perform steps at this time           OT Home environment access/layout  several stairs to enter home             SLP                SW                Discharge Planning/Teaching Needs:  Pt to d/c home with wife who can provide 24/7 assistance.  Teaching needs TBD   Team Discussion: CABG X 4, staples out today, vision issues.  RN - eating, cont B/B, nausea, given compazine, to DC  staples later.  OT S goals, CGA/min A now, is self pay.  PT S goals, currently min/CGA for transfers and amb 70', goals 4 steps min A with 1 rail.   Revisions to Treatment Plan: N/A     Medical Summary Current Status: s/p 4 vessel CABG- Weekly Focus/Goal: supervision goals- CGA-min assist currently- sternal precautions  Barriers to Discharge: Decreased family/caregiver support;Wound care;Other (comments);Medical stability;Home enviroment access/layout;Weight bearing restrictions  Barriers to Discharge Comments: s/p 4 vessel CABG Possible Resolutions to Barriers: PT- superviison goals- CGA for stairs; was able to go up/down 4 stairs with 1 rail with min assist this AM; has RW; abnormal gait pattern for hip pain/DJD   Continued Need for Acute Rehabilitation Level of Care: The patient requires daily medical management by a  physician with specialized training in physical medicine and rehabilitation for the following reasons: Direction of a multidisciplinary physical rehabilitation program to maximize functional independence : Yes Medical management of patient stability for increased activity during participation in an intensive rehabilitation regime.: Yes Analysis of laboratory values and/or radiology reports with any subsequent need for medication adjustment and/or medical intervention. : Yes   I attest that I was present, lead the team conference, and concur with the assessment and plan of the team.   Retta Diones 12/25/2018, 5:11 PM  Team conference was held via web/ teleconference due to [place] - 83

## 2018-12-25 NOTE — Progress Notes (Signed)
[facility] PHYSICAL MEDICINE & REHABILITATION PROGRESS NOTE   Subjective/Complaints:  Having visual disturbances still with zig zag impressions in visual fields- last time had it was yesterday.  To get staples out today out of sternum/chest  ROS- denies CP, SOB, abd pain, N/V/D, and HA.  Objective:   No results found. Recent Labs    12/24/18 0713  WBC 7.8  HGB 9.0*  HCT 28.1*  PLT 201   Recent Labs    12/24/18 0900  NA 140  K 3.9  CL 105  CO2 24  GLUCOSE 115*  BUN 25*  CREATININE 1.21  CALCIUM 8.4*    Intake/Output Summary (Last 24 hours) at 12/25/2018 0943 Last data filed at 12/25/2018 0645 Gross per 24 hour  Intake 360 ml  Output 2 ml  Net 358 ml     Physical Exam: Vital Signs Blood pressure 118/64, pulse 66, temperature 98.1 F (36.7 C), temperature source Oral, resp. rate 19, height 6\' 3"  (1.905 m), weight 102.5 kg, SpO2 96 %.  Physical Exam Nursing note, labs, and vitalsreviewed. Constitutional: awake, alert, appropriate, lying in bed; with nursing at bedside NAD  HENT:  Head:Normocephalicand atraumatic.  Eyes:EOMI B/L; conjugate gaze  Neck:Normal range of motion.  Cardiovascular:Normal rateand regular rhythm.  Respiratory:Effort normal. No respiratory distress. He is CTA B/L  Chest wall with staples in place- betadine still over sternum- staples to be removed today. GI: He exhibitsno distension. There isno abdominal tenderness.  Genitourinary: Genitourinary Comments: Scrotal edema Musculoskeletal:  Comments: RLE weakness with decreased ROM due to right hip pain and min edema right knee. Neurological: He is alertand oriented to person, place, and time. Nocranial nerve deficit.Coordinationnormal.  UE motor 4/5. LE 4- to 4/5 prox to distal. No sensory findings. Cognitively appropriate. Skin:No rashnoted. He is not diaphoretic. Noerythema.  Psychiatric: He has anormal mood and affect.   Assessment/Plan: 1.  Functional deficits secondary to debility from CABG x4 which require 3+ hours per day of interdisciplinary therapy in a comprehensive inpatient rehab setting.  Physiatrist is providing close team supervision and 24 hour management of active medical problems listed below.  Physiatrist and rehab team continue to assess barriers to discharge/monitor patient progress toward functional and medical goals  Care Tool:  Bathing    Body parts bathed by patient: Right arm, Chest, Left arm, Abdomen, Front perineal area, Right upper leg, Left upper leg, Face   Body parts bathed by helper: Buttocks, Right lower leg, Left lower leg     Bathing assist Assist Level: Minimal Assistance - Patient > 75%     Upper Body Dressing/Undressing Upper body dressing   What is the patient wearing?: Pull over shirt    Upper body assist Assist Level: Set up assist    Lower Body Dressing/Undressing Lower body dressing      What is the patient wearing?: Underwear/pull up, Pants     Lower body assist Assist for lower body dressing: Contact Guard/Touching assist     Toileting Toileting    Toileting assist Assist for toileting: Minimal Assistance - Patient > 75%     Transfers Chair/bed transfer  Transfers assist     Chair/bed transfer assist level: Minimal Assistance - Patient > 75% Chair/bed transfer assistive device: Programmer, multimedia   Ambulation assist      Assist level: Contact Guard/Touching assist Assistive device: Walker-rolling Max distance: 60'   Walk 10 feet activity   Assist     Assist level: Contact Guard/Touching assist Assistive device: Walker-rolling  Walk 50 feet activity   Assist Walk 50 feet with 2 turns activity did not occur: Safety/medical concerns(decreased strength/activity tolerance)  Assist level: Contact Guard/Touching assist Assistive device: Walker-rolling    Walk 150 feet activity   Assist Walk 150 feet activity did not occur:  Safety/medical concerns(decreased strength/activity tolerance)         Walk 10 feet on uneven surface  activity   Assist Walk 10 feet on uneven surfaces activity did not occur: Safety/medical concerns(decreased strength/activity tolerance)         Wheelchair     Assist     Wheelchair activity did not occur: Safety/medical concerns(sternal precautions)         Wheelchair 50 feet with 2 turns activity    Assist    Wheelchair 50 feet with 2 turns activity did not occur: Safety/medical concerns(sternal precautions)       Wheelchair 150 feet activity     Assist  Wheelchair 150 feet activity did not occur: Safety/medical concerns(sternal precautions)       Blood pressure 118/64, pulse 66, temperature 98.1 F (36.7 C), temperature source Oral, resp. rate 19, height 6\' 3"  (1.905 m), weight 102.5 kg, SpO2 96 %.  Medical Problem List and Plan: 1.Functional and mobility deficitssecondary to CABG x4 and associated post-op complications with subsequent debility -patient may shower -ELOS/Goals: 7-11 days, mod I goals 2. Antithrombotics: -DVT/anticoagulation:Mechanical:Sequential compression devices, below kneeBilateral lower extremities -antiplatelet therapy: ASA/PLavix 3.Chronic right hip pain/Pain Management:tylenol prn. Has not used any oxycodone in 48 hrs--continue prn.  4. Mood:LCSW to follow for evaluation and support. -antipsychotic agents: N/A 5. Neuropsych: This patientiscapable of making decisions onhisown behalf. 6. Skin/Wound Care:Monitor incisions for healing. Continue MVI. Add protein supplement.Per CVTA- pacing sutures to come out Monday and staples to be d/c 12/1. 7. Fluids/Electrolytes/Nutrition:Strict I/O. Check lytes in am.  8. Acute on chronic systolic CHF: TEDs for peripheral edema.   ON ASA,Lipitor,coreg bid, lanoxin, lasix 40 mg daily and Spironolactone to 25 mg on  11/27             -check daily weights   Filed Weights   12/23/18 0624 12/24/18 0528 12/25/18 0500  Weight: 105.8 kg 106.6 kg 102.5 kg   11/29- will monitor for trends and monitor daily 9. ABLA: Recheck in am. Continue iron supplement.  10. Pre-renal azotemia: stable 34-->28. Will monitor for now--recheck labs tomorrow  11/28- BUN 23- improved  12/1- BUN 25- will encourage fluid intake 11. Thrombocytopenia: Resolving.  11/28- Plts in 220s       LOS: 4 days A FACE TO FACE EVALUATION WAS PERFORMED  Frank Love 12/25/2018, 9:43 AM

## 2018-12-25 NOTE — Progress Notes (Signed)
All staples removed from chest incision. Patient tolerated well. 2 steri strips applied at lower section of wound. No bleeding/drainage noted.

## 2018-12-25 NOTE — Progress Notes (Signed)
Occupational Therapy Session Note  Patient Details  Name: KAYLUM FOXHOVEN MRN: [id number] Date of Birth: 1955/09/05  Today's Date: 12/25/2018 OT Individual Time: [id number] OT Individual Time Calculation (min): 55 min    Short Term Goals: Week 1:  OT Short Term Goal 1 (Week 1): Pt will be able to don pants over his feet with S with or without AE. OT Short Term Goal 2 (Week 1): Pt will be able to pull pants up/down over hips with S in standing. OT Short Term Goal 3 (Week 1): Pt will be able to transfer to toilet with RW with CGA. OT Short Term Goal 4 (Week 1): pt will be able to rise to stand from elevated toilet with CGA.  Skilled Therapeutic Interventions/Progress Updates:    Pt resting in recliner upon arrival.  Pt demonstrated doffing/donning socks and doffing/donning pants with supervision. Pt amb with RW to w/c in room and transitioned tub room to practiced tub bench transfers and [facility] transfers.  Pt requires min A for sit<>stand from tub bench. Pt transitioned to gym and practiced sit<>stand from various heights.  Pt performs sit<>stand from height he estimates his chair at home is with supervision.  Functional amb with RW at supervision level. Pt returned to room and requested to return to bed. Pt remained in bed with all needs within reach and bed alarm activated.   Therapy Documentation Precautions:  Precautions Precautions: Fall, Sternal Precaution Comments: right hip DJD Restrictions Weight Bearing Restrictions: Yes RLE Weight Bearing: Weight bearing as tolerated Other Position/Activity Restrictions: sternal precautions    Pain: Pain Assessment Pain Scale: 0-10 Pain Score: 0-No pain   Therapy/Group: Individual Therapy  Leroy Libman 12/25/2018, 12:14 PM

## 2018-12-26 ENCOUNTER — Telehealth (HOSPITAL_COMMUNITY): Payer: Self-pay

## 2018-12-26 ENCOUNTER — Inpatient Hospital Stay (HOSPITAL_COMMUNITY): Payer: No Typology Code available for payment source

## 2018-12-26 NOTE — Progress Notes (Signed)
Occupational Therapy Session Note  Patient Details  Name: Frank Love MRN: [id number] Date of Birth: 01/21/1956  Today's Date: 12/26/2018 OT Individual Time: 1000-1100 OT Individual Time Calculation (min): 60 min    Short Term Goals: Week 1:  OT Short Term Goal 1 (Week 1): Pt will be able to don pants over his feet with S with or without AE. OT Short Term Goal 2 (Week 1): Pt will be able to pull pants up/down over hips with S in standing. OT Short Term Goal 3 (Week 1): Pt will be able to transfer to toilet with RW with CGA. OT Short Term Goal 4 (Week 1): pt will be able to rise to stand from elevated toilet with CGA.  Skilled Therapeutic Interventions/Progress Updates:    1:1. Pt received in bed agreeable to OT. Per PA pt able to shower. OT applies occlusives to sternum. Pt completes all transfers at ambulatory level with RW and CGA fading to S with VC for RW managmet. Pt bathes seated iwht VC for lateral leans to wash buttocks and LHSS to wash BLE and back. Pt completes transfer from shower to [facility] over toilet and voids bladder with CGA seated on toilet. Pt requires min VC for sternal precautions during mobility. Pt completes dressing with set up for UB and S/VC for LB dressing using dressing stick and sock aide to don underwear/pants/non skid socks. OT dons teds. Exited session with pt seated in bed, exit alarm on and call light in reach  Therapy Documentation Precautions:  Precautions Precautions: Fall, Sternal Precaution Comments: right hip DJD Restrictions Weight Bearing Restrictions: Yes RLE Weight Bearing: Weight bearing as tolerated Other Position/Activity Restrictions: sternal precautions  General:   Vital Signs: Therapy Vitals Pulse Rate: 60 BP: (!) 91/58 Pain:   ADL: ADL Eating: Independent Grooming: Setup Where Assessed-Grooming: Wheelchair, Sitting at sink Upper Body Bathing: Setup Where Assessed-Upper Body Bathing: Wheelchair, Sitting at sink Lower Body  Bathing: Moderate assistance Where Assessed-Lower Body Bathing: Sitting at sink, Wheelchair, Standing at sink Upper Body Dressing: Setup Where Assessed-Upper Body Dressing: Wheelchair Lower Body Dressing: Maximal assistance Where Assessed-Lower Body Dressing: Wheelchair, Standing at sink, Sitting at sink Toileting: Moderate assistance Where Assessed-Toileting: Glass blower/designer: Moderate assistance Toilet Transfer Method: Stand pivot Toilet Transfer Equipment: Raised toilet seat Vision   Perception    Praxis   Exercises:   Other Treatments:     Therapy/Group: Individual Therapy  Tonny Branch 12/26/2018, 10:34 AM

## 2018-12-26 NOTE — Telephone Encounter (Signed)
Please contact patient to schedule a hos. Fu with DM   Thanks

## 2018-12-26 NOTE — Progress Notes (Signed)
Physical Therapy Discharge Summary  Patient Details  Name: Frank Love MRN: [id number] Date of Birth: 02-05-1955  Today's Date: 12/27/2018 PT Individual Time: 0812-0925 PT Individual Time Calculation (min): 73 min  Denies pain this morning. Functional bed mobility with extra time with overall supervision during session from hospital bed and flat mat with cues for precautions and technique. Educated on option to use wedge for support at home as pt was unable to tolerate laying flat PTA and slept in sofa chair/recliner. 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 Performed transfers throughout session with mainly supervision and occasional CGA for anterior weightshift during lower surface transfers with RW. Simulated car transfer with supervision using RW to simulated van height. Ramp negotiation with RW and ambulation over mulched surface to simulate community mobility at supervision level. During gait requires cues to keep RLE inside of RW to decrease fall risk. Pt able to ambulate x 100 and x 120' during session but limited due to fatigue. Gait at supervision level with RW with cues as described above and antalgic gait pattern noted and wide BOS, decreased stance time on R (chronic compensation from R hip DJD). Stair negotiation training for home entry practice with curb step and then with rail on R x 4 more steps at CGA level overall with independent recall of step sequencing. Instructed in supine HEP issued by primary PT including supine bridges, supine marches, supine clamshells with resistance, hip abduction, hamstring stretch (3 reps x 30 sec hold each side, limited ROM on R due to decrease tolerance), and LAQ x 10 reps each side BLE. Discussed importance of continued HEP to continue to build strength and endurance functionally but also in preparation for hip replacement surgery in the future. Pt  denies concerns in regards to upcoming d/ c tomorrow.    Patient has met 9 of 10 long term goals due to improved activity tolerance, improved balance, improved postural control, increased strength, decreased pain and ability to compensate for deficits.  Patient to discharge at an ambulatory level Supervision.   Patient's care partner unavailable for family education, but able to provide the necessary physical assistance at discharge with patient instruction.  Reasons goals not met: did not meet mod I sit <> stand goal. Requires supervision to occasional CGA (lower surfaces) due to sternal precautions  Recommendation:  Patient will benefit from ongoing skilled PT services in home health setting to continue to advance safe functional mobility, address ongoing impairments in functional mobility, strength/ROM, balance, activity tolerance, patient/caregiver education, and minimize fall risk.  Equipment: No equipment provided, patient has RW  Reasons for discharge: treatment goals met  Patient/family agrees with progress made and goals achieved: Yes  PT Discharge Precautions/Restrictions Precautions Precautions: Fall;Sternal Precaution Comments: right hip DJD Restrictions RLE Weight Bearing: Weight bearing as tolerated Other Position/Activity Restrictions: sternal precautions   Vision/Perception  Vision - Assessment Eye Alignment: Within Functional Limits Perception Perception: Within Functional Limits Praxis Praxis: Intact  Cognition Overall Cognitive Status: Within Functional Limits for tasks assessed Arousal/Alertness: Awake/alert Focused Attention: Appears intact Sustained Attention: Appears intact Memory: Appears intact Awareness: Appears intact Problem Solving: Appears intact Safety/Judgment: Appears intact Sensation Sensation Light Touch: Appears Intact Proprioception: Appears Intact Coordination Gross Motor Movements are Fluid and Coordinated: No Fine Motor Movements  are Fluid and Coordinated: Yes Coordination and Movement Description: Compensates for R hip DJD, improved gait since admission, decreased activity  tolerance and functional mobility limited by sternal precautions Motor  Motor Motor - Discharge Observations: Compensates for R hip DJD, improved gait since admission, decreased activity tolerance and functional mobility limited by sternal precautions  Mobility Transfers Transfers: Sit to Stand;Stand to Sit;Stand Pivot Transfers Sit to Stand: Supervision/Verbal cueing;Contact Guard/Touching assist Stand to Sit: Supervision/Verbal cueing Stand Pivot Transfers: Supervision/Verbal cueing Stand Pivot Transfer Details: Verbal cues for precautions/safety Stand Pivot Transfer Details (indicate cue type and reason): at times requires CGA for sit <> stand from lower surfaces Transfer (Assistive device): Rolling walker Locomotion  Gait Ambulation: Yes Gait Assistance: Supervision/Verbal cueing Gait Distance (Feet): 100 Feet Assistive device: Rolling walker Gait Assistance Details: Verbal cues for technique Gait Gait: Yes Gait Pattern: Step-to pattern;Decreased step length - left;Decreased stance time - right;Decreased hip/knee flexion - right;Decreased hip/knee flexion - left;Decreased weight shift to right;Antalgic;Lateral trunk lean to right;Decreased trunk rotation;Trunk rotated posteriorly on right;Abducted- right Gait velocity: decreased Stairs / Additional Locomotion Stairs: Yes Stairs Assistance: Contact Guard/Touching assist Stair Management Technique: One rail Right Number of Stairs: 8 Height of Stairs: 6 Curb: Contact Guard/Touching assist(with RW) Wheelchair Mobility Wheelchair Mobility: No  Trunk/Postural Assessment  Cervical Assessment Cervical Assessment: Within Functional Limits Thoracic Assessment Thoracic Assessment: Within Functional Limits(sternal precautions) Lumbar Assessment Lumbar Assessment: Within Functional  Limits Postural Control Postural Control: Within Functional Limits  Balance Static Sitting Balance Static Sitting - Level of Assistance: 7: Independent Dynamic Sitting Balance Dynamic Sitting - Level of Assistance: 7: Independent Static Standing Balance Static Standing - Level of Assistance: 6: Modified independent (Device/Increase time) Dynamic Standing Balance Dynamic Standing - Level of Assistance: 6: Modified independent (Device/Increase time) Extremity Assessment      RLE Assessment RLE Assessment: Exceptions to [facility] General Strength Comments: grossly 3+/5 hip, 3/5 knee ext and 4/5 ankle DF/PF LLE Assessment LLE Assessment: Within Functional Limits General Strength Comments: grossly WFL    Cherie L Grunenberg 12/26/2018, 4:51 PM   Lars Masson, PT, DPT, CBIS 12/27/18; 10:11 AM

## 2018-12-26 NOTE — Progress Notes (Signed)
[facility] PHYSICAL MEDICINE & REHABILITATION PROGRESS NOTE   Subjective/Complaints:  BP is 90/58 this AM- asked RN to hold carvedilol but give Digoxin, wasn't feeling orthostatic- feels good.   ROS- denies CP, SOB, abd pain, N/V/D, and HA.  Objective:   No results found. Recent Labs    12/24/18 0713  WBC 7.8  HGB 9.0*  HCT 28.1*  PLT 201   Recent Labs    12/24/18 0900  NA 140  K 3.9  CL 105  CO2 24  GLUCOSE 115*  BUN 25*  CREATININE 1.21  CALCIUM 8.4*    Intake/Output Summary (Last 24 hours) at 12/26/2018 0933 Last data filed at 12/26/2018 0730 Gross per 24 hour  Intake 960 ml  Output -  Net 960 ml     Physical Exam: Vital Signs Blood pressure (!) 91/58, pulse 60, temperature 97.8 F (36.6 C), resp. rate 16, height 6\' 3"  (1.905 m), weight 105.1 kg, SpO2 96 %.  Physical Exam Nursing note, labs, and vitalsreviewed. Constitutional: awake, alert, appropriate, sitting up in manual w/c, OT in room with nursing at bedside NAD  HENT:  Head:Normocephalicand atraumatic.  Eyes:EOMI B/L; conjugate gaze  Neck:Normal range of motion.  Cardiovascular:Normal rateand regular rhythm.  Respiratory:Effort normal. No respiratory distress. He is CTA B/L  Chest wall with staples out and steristrips  in place-  GI: He exhibitsno distension. There isno abdominal tenderness.  Genitourinary: Genitourinary Comments: Scrotal edema Musculoskeletal:  Comments: RLE weakness with decreased ROM due to right hip pain and min edema right knee. Neurological: He is alertand oriented to person, place, and time. Nocranial nerve deficit.Coordinationnormal.  UE motor 4/5. LE 4- to 4/5 prox to distal. No sensory findings. Cognitively appropriate. Skin:No rashnoted. He is not diaphoretic. Noerythema.  Psychiatric: He has anormal mood and affect.   Assessment/Plan: 1. 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  Physiatrist is providing close team supervision and 24 hour management of active medical problems listed below.  Physiatrist and rehab team continue to assess barriers to discharge/monitor patient progress toward functional and medical goals  Care Tool:  Bathing    Body parts bathed by patient: Right arm, Chest, Left arm, Abdomen, Front perineal area, Right upper leg, Left upper leg, Face   Body parts bathed by helper: Buttocks, Right lower leg, Left lower leg     Bathing assist Assist Level: Minimal Assistance - Patient > 75%     Upper Body Dressing/Undressing Upper body dressing   What is the patient wearing?: Pull over shirt    Upper body assist Assist Level: Set up assist    Lower Body Dressing/Undressing Lower body dressing      What is the patient wearing?: Underwear/pull up, Pants     Lower body assist Assist for lower body dressing: Contact Guard/Touching assist     Toileting Toileting    Toileting assist Assist for toileting: Minimal Assistance - Patient > 75%     Transfers Chair/bed transfer  Transfers assist     Chair/bed transfer assist level: Minimal Assistance - Patient > 75% Chair/bed transfer assistive device: Programmer, multimedia   Ambulation assist      Assist level: Supervision/Verbal cueing Assistive device: Walker-rolling Max distance: 135'   Walk 10 feet activity   Assist     Assist level: Supervision/Verbal cueing Assistive device: Walker-rolling   Walk 50 feet activity   Assist Walk 50 feet  with 2 turns activity did not occur: Safety/medical concerns(decreased strength/activity tolerance)  Assist level: Supervision/Verbal cueing Assistive device: Walker-rolling    Walk 150 feet activity   Assist Walk 150 feet activity did not occur: Safety/medical concerns(decreased strength/activity tolerance)         Walk 10 feet on uneven  surface  activity   Assist Walk 10 feet on uneven surfaces activity did not occur: Safety/medical concerns(decreased strength/activity tolerance)         Wheelchair     Assist     Wheelchair activity did not occur: Safety/medical concerns(sternal precautions)         Wheelchair 50 feet with 2 turns activity    Assist    Wheelchair 50 feet with 2 turns activity did not occur: Safety/medical concerns(sternal precautions)       Wheelchair 150 feet activity     Assist  Wheelchair 150 feet activity did not occur: Safety/medical concerns(sternal precautions)       Blood pressure (!) 91/58, pulse 60, temperature 97.8 F (36.6 C), resp. rate 16, height 6\' 3"  (1.905 m), weight 105.1 kg, SpO2 96 %.  Medical Problem List and Plan: 1.Functional and mobility deficitssecondary to CABG x4 and associated post-op complications with subsequent debility -patient may shower -ELOS/Goals: 7-11 days, mod I goals 2. Antithrombotics: -DVT/anticoagulation:Mechanical:Sequential compression devices, below kneeBilateral lower extremities -antiplatelet therapy: ASA/PLavix 3.Chronic right hip pain/Pain Management:tylenol prn. Has not used any oxycodone in 48 hrs--continue prn.  4. Mood:LCSW to follow for evaluation and support. -antipsychotic agents: N/A 5. Neuropsych: This patientiscapable of making decisions onhisown behalf. 6. Skin/Wound Care:Monitor incisions for healing. Continue MVI. Add protein supplement.Per CVTA- pacing sutures to come out Monday and staples to be d/c 12/1. 7. Fluids/Electrolytes/Nutrition:Strict I/O. Check lytes in am.  8. Acute on chronic systolic CHF: TEDs for peripheral edema.   ON ASA,Lipitor,coreg bid, lanoxin, lasix 40 mg daily and Spironolactone to 25 mg on 11/27             -check daily weights   Filed Weights   12/24/18 0528 12/25/18 0500 12/26/18 0553  Weight: 106.6 kg  102.5 kg 105.1 kg   11/29- will monitor for trends and monitor daily  12/2- weight 105kg- lower than when got here. Will con't Meds but hold carvedilol today due to low BP. 9. ABLA: Recheck in am. Continue iron supplement.  10. Pre-renal azotemia: stable 34-->28. Will monitor for now--recheck labs tomorrow  11/28- BUN 23- improved  12/1- BUN 25- will encourage fluid intake 11. Thrombocytopenia: Resolving.  11/28- Plts in 220s       LOS: 5 days A FACE TO FACE EVALUATION WAS PERFORMED  Courtney Fenlon 12/26/2018, 9:33 AM

## 2018-12-26 NOTE — Progress Notes (Signed)
Occupational Therapy Note  Patient Details  Name: Frank Love MRN: [id number] Date of Birth: 03/07/1955  Attempted to follow up with pt to complete 15 min OT to make up for missed time, however pt politely declines stating, "I think I just want to rest." Exited with pt remaining in bed with all needs in reach   [facility] 12/26/2018, 11:51 AM

## 2018-12-26 NOTE — Progress Notes (Signed)
Physical Therapy Session Note  Patient Details  Name: Frank Love MRN: [id number] Date of Birth: 1955/08/06  Today's Date: 12/26/2018 PT Individual Time: 0800-0915 PT Individual Time Calculation (min): 75 min   Short Term Goals: Week 1:  PT Short Term Goal 1 (Week 1): STG=LTG due to short ELOS.  Skilled Therapeutic Interventions/Progress Updates:     Patient in bed upon PT arrival. Patient alert and agreeable to PT session. Patient denied pain throughout session.   Therapeutic Activity: Bed Mobility: Patient performed supine to sit with min A-CGA for trunk support. Provided verbal cues for rolling to side lying to sit up with decreased UE support to maintain sternal precautions. Transfers: Patient performed sit to/from stand x4 with min a-CGA. Provided verbal cues for scooting forward and leaning far forward to stand.  Gait Training:  Patient ambulated 100 feet using RW with supervision for safety. Ambulated with antalgic gait on R with step-to gait pattern leading with R, wide BOS, and forward trunk lean. Provided verbal cues for erect posture and narrow BOS. Stair training: He went up/down a 7" curb step using a RW then 4-6" steps using R rail only with B UE support and side-stepping technique with CGA. RPE 7/10 and HR 97 and required a seated rest break. To simulate entry to his apartment, he then performed going up/down a 7" curb step x2 using a RW then 4-6" steps x2 using R rail only with B UE support and side-stepping technique with CGA. Required 2 standing rest breaks during second trial, RPE 7/10 HR 98 after.  Performed a step-to gait pattern leading with L while ascending and R while descending on all stair trials. Provided cues for sequencing, technique, increased weight shift to the R, and light use of UEs to maintain sternal precautions.  Wheelchair Mobility:  Patient was transported in the w/c with total A throughout session to maintain sternal precautions, energy  conservation, and time management.  Neuromuscular Re-ed: Patient performed blocked practice for sit to/form stand from bed at lowest setting. First performed pre-standing to lean far forward to lift hips off the bed x4 then performed sit to/from stand x3 with very minimal facilitation for forward weight shift, otherwise CGA using the RW.  Therapeutic Exercise: Provided patient with HEP handout, reviewed exercises, but did not perform them this session. Instructed patient to performed exercises x2 today and write down questions to be answered in tomorrows session.   Patient in bed at end of session with breaks locked, bed alarm set, and all needs within reach. Educated on fall risk prevention, home modifications, and activation of emergency response in the event of a fall during session. Patient receptive to education.    Therapy Documentation Precautions:  Precautions Precautions: Fall, Sternal Precaution Comments: right hip DJD Restrictions Weight Bearing Restrictions: Yes RLE Weight Bearing: Weight bearing as tolerated Other Position/Activity Restrictions: sternal precautions     Therapy/Group: Individual Therapy  Cadi Rhinehart L Matha Masse PT, DPT  12/26/2018, 12:53 PM

## 2018-12-26 NOTE — Telephone Encounter (Signed)
-----   Message from Larey Dresser, MD sent at 12/21/2018  9:49 AM EST ----- Needs hospital followup with me in about 2 wks.

## 2018-12-26 NOTE — Progress Notes (Signed)
Occupational Therapy Session Note  Patient Details  Name: Frank Love MRN: [id number] Date of Birth: 1955/08/22  Today's Date: 12/26/2018 OT Individual Time: 1300-1400 OT Individual Time Calculation (min): 60 min    Short Term Goals: Week 1:  OT Short Term Goal 1 (Week 1): Pt will be able to don pants over his feet with S with or without AE. OT Short Term Goal 2 (Week 1): Pt will be able to pull pants up/down over hips with S in standing. OT Short Term Goal 3 (Week 1): Pt will be able to transfer to toilet with RW with CGA. OT Short Term Goal 4 (Week 1): pt will be able to rise to stand from elevated toilet with CGA.  Skilled Therapeutic Interventions/Progress Updates:    Pt received supine with no c/o pain. Pt completed bed mobility with min A for lifting A. Pt completed sit > stand from EOB with CGA. Pt able to complete stand pivot transfer with RW with CGA to w/c. Pt transported to therapy gym. Pt completed sit > stand from EOM with wedge placed under toes to challenge dynamic standing balance. Pt able to complete BUE coordination task in standing for increasing functional activity tolerance. Pt completed 2x trials. Pt then completed 3x intervals of standing level BUE strengthening circuit within sternal precautions limitations- holding a 1 kg medicine ball and remaining "in the tube" for all exercises. Pt able to complete mini squats with very limited range, 2/2 pre-existing hip limitations, with CGA. Pt then completed 85 ft of functional mobility with close (S) before needing a seated rest break. Pt able to complete another 100 ft before returning to bed. Pt was left supine with all needs met, bed alarm set.   Therapy Documentation Precautions:  Precautions Precautions: Fall, Sternal Precaution Comments: right hip DJD Restrictions Weight Bearing Restrictions: Yes RLE Weight Bearing: Weight bearing as tolerated Other Position/Activity Restrictions: sternal precautions     Therapy/Group: Individual Therapy  Curtis Sites 12/26/2018, 7:28 AM

## 2018-12-27 ENCOUNTER — Inpatient Hospital Stay (HOSPITAL_COMMUNITY): Payer: No Typology Code available for payment source

## 2018-12-27 MED ORDER — FUROSEMIDE 40 MG PO TABS
40.0000 mg | ORAL_TABLET | Freq: Once | ORAL | Status: AC
  Administered 2018-12-27: 40 mg via ORAL
  Filled 2018-12-27: qty 1

## 2018-12-27 NOTE — Progress Notes (Signed)
Occupational Therapy Session Note  Patient Details  Name: BARETTA ZIPKIN MRN: [id number] Date of Birth: 1955-11-22  Today's Date: 12/27/2018 OT Individual Time: 1345-1425 OT Individual Time Calculation (min): 40 min    Short Term Goals: Week 1:  OT Short Term Goal 1 (Week 1): Pt will be able to don pants over his feet with S with or without AE. OT Short Term Goal 2 (Week 1): Pt will be able to pull pants up/down over hips with S in standing. OT Short Term Goal 3 (Week 1): Pt will be able to transfer to toilet with RW with CGA. OT Short Term Goal 4 (Week 1): pt will be able to rise to stand from elevated toilet with CGA.  Skilled Therapeutic Interventions/Progress Updates:    Pt resting in bed upon arrival and agreeable to therapy. OT intervention with focus on functional transfers, energy conservation strategies, home safety education, standing balance, and discharge planning in preparation for discharge home tomorrow.  Pt supervision for all functional amb with RW in home envirionment.  Pt does not display any unsafe behaviors. Standing balance at sink with supervision.  Sit<>stand from [facility] over toilet with supervision.  Pt returned to bed and remained in bed with bed alarm activated and all needs within reach.   Therapy Documentation Precautions:  Precautions Precautions: Fall, Sternal Precaution Comments: right hip DJD Restrictions Weight Bearing Restrictions: No RLE Weight Bearing: Weight bearing as tolerated Other Position/Activity Restrictions: sternal precautions    Pain:  Pt denies pain this afternoon   Therapy/Group: Individual Therapy  Leroy Libman 12/27/2018, 2:33 PM

## 2018-12-27 NOTE — Care Management (Signed)
[facility] Individual Statement of Services  Patient Name:  Frank Love  Date:  12/24/2018  Welcome to the [facility].  Our goal is to provide you with an individualized program based on your diagnosis and situation, designed to meet your specific needs.  With this comprehensive rehabilitation program, you will be expected to participate in at least 3 hours of rehabilitation therapies Monday-Friday, with modified therapy programming on the weekends.  Your rehabilitation program will include the following services:  Physical Therapy (PT), Occupational Therapy (OT), 24 hour per day rehabilitation nursing, Case Management (Social Worker), Rehabilitation Medicine, Nutrition Services and Pharmacy Services  Weekly team conferences will be held on Tuesdays to discuss your progress.  Your Social Worker will talk with you frequently to get your input and to update you on team discussions.  Team conferences with you and your family in attendance may also be held.  Expected length of stay: 8-12 days   Overall anticipated outcome: supervision  Depending on your progress and recovery, your program may change. Your Social Worker will coordinate services and will keep you informed of any changes. Your Social Worker's name and contact numbers are listed  below.  The following services may also be recommended but are not provided by the [facility] will be made to provide these services after discharge if needed.  Arrangements include referral to agencies that provide these services.  Your insurance has been verified to be:  None (Medicaid app pending) Your primary doctor is:  Geryl Rankins, NP  Pertinent information will be shared with your doctor and your insurance company.  Social Worker:  [place], [place] or  [phone]   Information discussed with and copy given to patient by: Lennart Pall, 12/24/2018, 1:53 PM

## 2018-12-27 NOTE — Discharge Summary (Signed)
Physician Discharge Summary  Patient ID: Frank Love MRN: [id number] DOB/AGE: 06-28-1955 63 y.o.  Admit date: 12/21/2018 Discharge date: 12/28/2018  Discharge Diagnoses:  Principal Problem:   Debility Active Problems:   Acute on chronic systolic heart failure (HCC)   Coronary artery disease involving native coronary artery of native heart without angina pectoris   S/P CABG x 4   Postoperative anemia due to acute blood loss   Discharged Condition:  Stable   Significant Diagnostic Studies: N/A   Labs:  Basic Metabolic Panel: BMP Latest Ref Rng & Units 12/24/2018 12/22/2018 12/21/2018  Glucose 70 - 99 mg/dL 115(H) 104(H) 103(H)  BUN 8 - 23 mg/dL 25(H) 23 28(H)  Creatinine 0.61 - 1.24 mg/dL 1.21 1.18 1.06  BUN/Creat Ratio 10 - 24 - - -  Sodium 135 - 145 mmol/L 140 142 144  Potassium 3.5 - 5.1 mmol/L 3.9 4.1 3.7  Chloride 98 - 111 mmol/L 105 103 105  CO2 22 - 32 mmol/L 24 27 28   Calcium 8.9 - 10.3 mg/dL 8.4(L) 8.3(L) 8.1(L)    CBC: CBC Latest Ref Rng & Units 12/24/2018 12/22/2018 12/17/2018  WBC 4.0 - 10.5 K/uL 7.8 8.3 5.9  Hemoglobin 13.0 - 17.0 g/dL 9.0(L) 9.1(L) 7.9(L)  Hematocrit 39.0 - 52.0 % 28.1(L) 28.5(L) 24.5(L)  Platelets 150 - 400 K/uL 201 227 122(L)    CBG: No results for input(s): GLUCAP in the last 168 hours.   Brief HPI:   Frank Love is a 63 y.o. male with history of HTN, end-stage DJD right hip, CAD with systolic CHF was admitted on 12/12/2022 CABG x4 by Dr. Julien Girt.  Postop had excessive output from chest tube requiring FFP, cryoprecipitate and 2 units PRBC.  He was taken back to the OR that evening for mediastinal reexploration due to postop bleeding.  Hospital course significant for issues with fluid overload requiring diuresis as well as persistent hypotension.  Follow-up echo done showing EF of 30 to 35% with global hypokinesis and no LV thrombus.  Blood pressures are slowly stabilizing and acute on chronic systolic failure with peripheral  edema has been managed with cardiology assistance.  A BLA, thrombocytopenia and hypokalemia is slowly improving.  Therapy has been ongoing and CIR was recommended due to debility.   Hospital Course: Frank Love was admitted to rehab 12/21/2018 for inpatient therapies to consist of PT, ST and OT at least three hours five days a week. Past admission physiatrist, therapy team and rehab RN have worked together to provide customized collaborative inpatient rehab. His blood pressures were monitored on TID basis and has been reasonably controlled. Acute on chronic systolic CHF has been monitored with daily weights as well as for additional symptoms.  He developed peripheral edema with upward trend in weigh to 106 lbs. He was treated with extra doses of lasix with weight down to 97 kg at discharge. Pre renal azotemia has been monitored with serial checks. He was advised to increase intake of water and BUN has improved from 34--> 25. ABLA is slowly improving and thrombocytopenia has resolved. Patient and wife have been educated on heart healthy diet and well as importance of monitoring daily weights.  Staples were removed on 12/1, midline sternal incision is C/D/I and is healing well.   His mood has been stable and he is continent of bowel and bladder.  He has made gains during rehab stay but continues to be limited by endstage DJD right hip and requires extra time with mobility. He is currently  at supervision level. He will continue to receive follow up HHPT by [facility]  after discharge.   Rehab course: During patient's stay in rehab weekly team conferences were held to monitor patient's progress, set goals and discuss barriers to discharge. At admission, patient required mod assist with mobility and max assist with basic self care tasks.  He  has had improvement in activity tolerance, balance, postural control as well as ability to compensate for deficits. He is able to complete ADL tasks with  supervision. He requires supervision for transfers and to ambulate 100-200' with RW--limited by fatigue and right hip pain due to DJD. Wife was unavailable for family education but reports will be able to provide care needed.    Disposition: Home  Diet: Heart healthy.   Special Instructions: 1. Continue sternal precautions, no strenuous activity and no driving till cleared by MD.   Discharge Instructions    Ambulatory referral to Physical Medicine Rehab   Complete by: As directed    1-2 weeks transitional care appt     Allergies as of 12/28/2018   No Known Allergies     Medication List    STOP taking these medications   ondansetron 4 MG disintegrating tablet Commonly known as: ZOFRAN-ODT   oxyCODONE 5 MG immediate release tablet Commonly known as: Oxy IR/ROXICODONE     TAKE these medications   aspirin EC 81 MG tablet Take 81 mg by mouth at bedtime.   atorvastatin 40 MG tablet Commonly known as: LIPITOR Take 1 tablet (40 mg total) by mouth daily at 6 PM.   carvedilol 3.125 MG tablet Commonly known as: COREG Take 1 tablet (3.125 mg total) by mouth 2 (two) times daily with a meal.   clopidogrel 75 MG tablet Commonly known as: PLAVIX Take 1 tablet (75 mg total) by mouth daily.   colchicine 0.6 MG tablet Take 1 tablet (0.6 mg total) by mouth daily. What changed: additional instructions   digoxin 0.125 MG tablet Commonly known as: LANOXIN Take 1 tablet (0.125 mg total) by mouth daily.   ferrous [id number] C-folic acid capsule Commonly known as: TRINSICON / FOLTRIN Take 1 capsule by mouth 2 (two) times daily after a meal.   furosemide 40 MG tablet Commonly known as: LASIX Take 1 tablet (40 mg total) by mouth daily. . What changed: additional instructions   losartan 25 MG tablet Commonly known as: COZAAR Take 0.5 tablets (12.5 mg total) by mouth at bedtime.   multivitamins with iron Tabs tablet Take 1 tablet by mouth daily.   nitroGLYCERIN 0.4  MG SL tablet Commonly known as: NITROSTAT Place 1 tablet (0.4 mg total) under the tongue every 5 (five) minutes as needed for chest pain. NO more than 3 pills--if you have the need to take 2 pills or more call MD   pantoprazole 20 MG tablet Commonly known as: PROTONIX Take 1 tablet (20 mg total) by mouth daily.   potassium chloride SA 20 MEQ tablet Commonly known as: KLOR-CON Take 1 tablet (20 mEq total) by mouth daily. What changed: additional instructions   promethazine 12.5 MG tablet Commonly known as: PHENERGAN Take 0.5 tablets (6.25 mg total) by mouth every 8 (eight) hours as needed for nausea or vomiting. What changed: when to take this   senna-docusate 8.6-50 MG tablet Commonly known as: Senokot-S Take 2 tablets by mouth at bedtime as needed for mild constipation.   spironolactone 25 MG tablet Commonly known as: ALDACTONE Take 1 tablet (25 mg total) by mouth  daily.   traZODone 50 MG tablet Commonly known as: DESYREL Take 0.5 tablets (25 mg total) by mouth at bedtime as needed for sleep.      Follow-up Information    Lovorn, Jinny Blossom, MD Follow up.   Specialty: Physical Medicine and Rehabilitation Why: Office will call you with follow up appointment Contact information: [id number] [place] [address] [phone]        [name], MD Follow up on 01/07/2019.   Specialty: Cardiothoracic Surgery Why: Appointment at 10:30 am Contact information: [address] [place] [address] [phone]        Larey Dresser, MD Follow up.   Specialty: Cardiology Why: Will be seeing the PA/Appointmet at 10 am-- Garage Code [phone] Contact information: [place] [address] [place] Follow up on 01/29/2019.   Why: Telephone#[phone]--to set up for primary care/Medication refills 10:30 am. Contact information: [address] [place]  [phone]          Signed: Bary Leriche 12/31/2018, 10:00 AM

## 2018-12-27 NOTE — Progress Notes (Signed)
PHYSICAL MEDICINE & REHABILITATION PROGRESS NOTE   Subjective/Complaints:   Complains of chronic R hip pain.  Asking if "insurance" covers BP cuff- explained it doesn't.    ROS- denies CP, SOB, abd pain, N/V/D, and HA.  Objective:   No results found. No results for input(s): WBC, HGB, HCT, PLT in the last 72 hours. No results for input(s): NA, K, CL, CO2, GLUCOSE, BUN, CREATININE, CALCIUM in the last 72 hours.  Intake/Output Summary (Last 24 hours) at 12/27/2018 1032 Last data filed at 12/27/2018 0720 Gross per 24 hour  Intake 480 ml  Output -  Net 480 ml     Physical Exam: Vital Signs Blood pressure 110/60, pulse 66, temperature 98.4 F (36.9 C), temperature source Oral, resp. rate 14, height 6\' 3"  (1.905 m), weight 106.8 kg, SpO2 97 %.  Physical Exam Nursing note, labs, and vitalsreviewed. Constitutional: awake, alert, appropriate, walking down hall; antalgic gait due to R hip pain; NAD  HENT:  Head:Normocephalicand atraumatic.  Eyes:EOMI B/L; conjugate gaze  Neck:Normal range of motion.  Cardiovascular:Normal rateand regular rhythm.  Respiratory:Effort normal. No respiratory distress. He is CTA B/L  Chest wall with staples out and steristrips  in place-  GI: He exhibitsno distension. There isno abdominal tenderness.  Genitourinary: Genitourinary Comments: Scrotal edema Musculoskeletal:  Comments: RLE weakness with decreased ROM due to right hip pain and min edema right knee. Neurological: He is alertand oriented to person, place, and time. Nocranial nerve deficit.Coordinationnormal.  UE motor 4/5. LE 4- to 4/5 prox to distal. No sensory findings. Cognitively appropriate. Skin:No rashnoted. He is not diaphoretic. Noerythema.  Psychiatric: He has anormal mood and affect.   Assessment/Plan: 1. Functional deficits secondary to debility from CABG x4 which require 3+ hours per day of interdisciplinary therapy in a  comprehensive inpatient rehab setting.  Physiatrist is providing close team supervision and 24 hour management of active medical problems listed below.  Physiatrist and rehab team continue to assess barriers to discharge/monitor patient progress toward functional and medical goals  Care Tool:  Bathing    Body parts bathed by patient: Right arm, Chest, Left arm, Abdomen, Front perineal area, Right upper leg, Left upper leg, Face, Buttocks, Right lower leg, Left lower leg   Body parts bathed by helper: Buttocks, Right lower leg, Left lower leg     Bathing assist Assist Level: Supervision/Verbal cueing     Upper Body Dressing/Undressing Upper body dressing   What is the patient wearing?: Pull over shirt    Upper body assist Assist Level: Set up assist    Lower Body Dressing/Undressing Lower body dressing      What is the patient wearing?: Underwear/pull up, Pants     Lower body assist Assist for lower body dressing: Supervision/Verbal cueing     Toileting Toileting    Toileting assist Assist for toileting: Contact Guard/Touching assist     Transfers Chair/bed transfer  Transfers assist     Chair/bed transfer assist level: Supervision/Verbal cueing Chair/bed transfer assistive device: Armrests, Programmer, multimedia   Ambulation assist      Assist level: Supervision/Verbal cueing Assistive device: Walker-rolling Max distance: 120'   Walk 10 feet activity   Assist     Assist level: Supervision/Verbal cueing Assistive device: Walker-rolling   Walk 50 feet activity   Assist Walk 50 feet with 2 turns activity did not occur: Safety/medical concerns(decreased strength/activity tolerance)  Assist level: Supervision/Verbal cueing Assistive device: Walker-rolling    Walk 150 feet activity  Assist Walk 150 feet activity did not occur: Safety/medical concerns(endurance)         Walk 10 feet on uneven surface  activity   Assist  Walk 10 feet on uneven surfaces activity did not occur: Safety/medical concerns(decreased strength/activity tolerance)   Assist level: Contact Guard/Touching assist Assistive device: Aeronautical engineer Will patient use wheelchair at discharge?: No(primary ambulator; s/p CABG)   Wheelchair activity did not occur: Safety/medical concerns(sternal precautions)  Wheelchair assist level: Dependent - Patient 0%      Wheelchair 50 feet with 2 turns activity    Assist    Wheelchair 50 feet with 2 turns activity did not occur: Safety/medical concerns(sternal precautions)   Assist Level: Dependent - Patient 0%   Wheelchair 150 feet activity     Assist  Wheelchair 150 feet activity did not occur: Safety/medical concerns(sternal precautions)   Assist Level: Dependent - Patient 0%   Blood pressure 110/60, pulse 66, temperature 98.4 F (36.9 C), temperature source Oral, resp. rate 14, height 6\' 3"  (1.905 m), weight 106.8 kg, SpO2 97 %.  Medical Problem List and Plan: 1.Functional and mobility deficitssecondary to CABG x4 and associated post-op complications with subsequent debility -patient may shower -ELOS/Goals: 7-11 days, mod I goals 2. Antithrombotics: -DVT/anticoagulation:Mechanical:Sequential compression devices, below kneeBilateral lower extremities -antiplatelet therapy: ASA/PLavix 3.Chronic right hip pain/Pain Management:tylenol prn. Has not used any oxycodone in 48 hrs--continue prn.  4. Mood:LCSW to follow for evaluation and support. -antipsychotic agents: N/A 5. Neuropsych: This patientiscapable of making decisions onhisown behalf. 6. Skin/Wound Care:Monitor incisions for healing. Continue MVI. Add protein supplement.Per CVTA- pacing sutures to come out Monday and staples to be d/c 12/1. 7. Fluids/Electrolytes/Nutrition:Strict I/O. Check lytes in am.  8. Acute on chronic  systolic CHF: TEDs for peripheral edema.   ON ASA,Lipitor,coreg bid, lanoxin, lasix 40 mg daily and Spironolactone to 25 mg on 11/27             -check daily weights   Filed Weights   12/25/18 0500 12/26/18 0553 12/27/18 0435  Weight: 102.5 kg 105.1 kg 106.8 kg   11/29- will monitor for trends and monitor daily  12/2- weight 105kg- lower than when got here. Will con't Meds but hold carvedilol today due to low BP.  12/3- will give 1 dose additional Lasix due to weight up to 106.8 kg today.  9. ABLA: Recheck in am. Continue iron supplement.  10. Pre-renal azotemia: stable 34-->28. Will monitor for now--recheck labs tomorrow  11/28- BUN 23- improved  12/1- BUN 25- will encourage fluid intake 11. Thrombocytopenia: Resolving.  11/28- Plts in 220s       LOS: 6 days A FACE TO FACE EVALUATION WAS PERFORMED  Larna Capelle 12/27/2018, 10:32 AM

## 2018-12-27 NOTE — Progress Notes (Signed)
Occupational Therapy Discharge Summary  Patient Details  Name: Frank Love MRN: [id number] Date of Birth: [birth year]   Patient has met 8 of 8 long term goals due to improved activity tolerance, improved balance, ability to compensate for deficits, improved coordination and adherence to sternal precautions. Pt made excellent progress with BADLs, functional transfers, and functional amb with RW during this admission. Pt is supervision for bathing and dressing tasks.  Pt performs tub bench transfers and toilet transfers with supervisioin.  Pt verbalizes understanding of home safety recommendations and recommendation for supervision at home. Pt pleased with progress and ready for discharge home tomorrow.  Patient to discharge at overall Supervision level.  Patient's care partner is independent to provide the necessary supervision assistance at discharge.    Recommendation:  No follow up recommended   Equipment: BSC and tub tranfser bench  Reasons for discharge: treatment goals met  Patient/family agrees with progress made and goals achieved: Yes  OT Discharge Vision Baseline Vision/History: No visual deficits Wears Glasses: Reading only Patient Visual Report: Other (comment)(intermittent zig zags) Vision Assessment?: No apparent visual deficits Eye Alignment: Within Functional Limits Ocular Range of Motion: Within Functional Limits Alignment/Gaze Preference: Within Defined Limits Tracking/Visual Pursuits: Able to track stimulus in all quads without difficulty Saccades: Within functional limits Convergence: Within functional limits Perception  Perception: Within Functional Limits Praxis Praxis: Intact Cognition Overall Cognitive Status: Within Functional Limits for tasks assessed Arousal/Alertness: Awake/alert Orientation Level: Oriented X4 Attention: Focused;Sustained Focused Attention: Appears intact Sustained Attention: Appears intact Memory: Appears intact Immediate  Memory Recall: Sock;Blue;Bed Memory Recall Sock: Without Cue Memory Recall Blue: Without Cue Memory Recall Bed: Without Cue Awareness: Appears intact Problem Solving: Appears intact Safety/Judgment: Appears intact Sensation Sensation Light Touch: Appears Intact Hot/Cold: Appears Intact Proprioception: Appears Intact Stereognosis: Appears Intact Coordination Gross Motor Movements are Fluid and Coordinated: Yes Fine Motor Movements are Fluid and Coordinated: Yes Motor  Motor Motor: Other (comment) Motor - Discharge Observations: Compensates for R hip DJD, improved gait since admission, decreased activity tolerance and functional mobility limited by sternal precautions    Trunk/Postural Assessment  Cervical Assessment Cervical Assessment: Within Functional Limits Thoracic Assessment Thoracic Assessment: Within Functional Limits Lumbar Assessment Lumbar Assessment: Within Functional Limits Postural Control Postural Control: Within Functional Limits  Balance Static Sitting Balance Static Sitting - Balance Support: No upper extremity supported Static Sitting - Level of Assistance: 7: Independent Dynamic Sitting Balance Dynamic Sitting - Balance Support: No upper extremity supported;Feet supported;During functional activity Dynamic Sitting - Level of Assistance: 7: Independent Extremity/Trunk Assessment RUE Assessment RUE Assessment: Within Functional Limits General Strength Comments: sternal precautions - WFL LUE Assessment LUE Assessment: Within Functional Limits General Strength Comments: sternal precautions - WFL   Leroy Libman 12/27/2018, 11:58 AM

## 2018-12-27 NOTE — Plan of Care (Signed)
Nutrition Education Note  RD consulted for nutrition education regarding a heart healthy diet. Per RN note, pt requesting information regarding foods to eat s/p CABG.  RD working remotely.  Unable to reach pt via phone call to room despite multiple attempts.  RD has attached "Low Sodium Nutrition Therapy" handout and "Sodium Free Flavoring Tips" handout from the Academy of [employer] and Dietetics to AVS/Discharge Instructions. Handouts provide examples on ways to decrease sodium intake in diet. Handouts discourage intake of processed foods and use of salt shaker and encourage fresh fruits and vegetables as well as whole grain sources of carbohydrates to maximize fiber intake.   Handouts emphasize the role of fluids, foods to avoid, and importance of weighing self daily.  Expect fair compliance.  Body mass index is 29.43 kg/m. Pt meets criteria for overweight based on current BMI.  Current diet order is Heart Healthy/Carb Modified, patient is consuming approximately 100% of meals at this time. Labs and medications reviewed. No further nutrition interventions warranted at this time. RD contact information provided. If additional nutrition issues arise, please re-consult RD.    Frank Face, MS, RD, LDN Inpatient Clinical Dietitian Pager: [phone] Weekend/After Hours: [phone]

## 2018-12-27 NOTE — Progress Notes (Signed)
Patient was asking for dietary consult re: food to eat after CABG. PAM PA notified. Product/process development scientist given.

## 2018-12-27 NOTE — Progress Notes (Signed)
Occupational Therapy Session Note  Patient Details  Name: Frank Love MRN: [id number] Date of Birth: 03/02/1955  Today's Date: 12/27/2018 OT Individual Time: [id number] OT Individual Time Calculation (min): 44 min    Short Term Goals: Week 1:  OT Short Term Goal 1 (Week 1): Pt will be able to don pants over his feet with S with or without AE. OT Short Term Goal 2 (Week 1): Pt will be able to pull pants up/down over hips with S in standing. OT Short Term Goal 3 (Week 1): Pt will be able to transfer to toilet with RW with CGA. OT Short Term Goal 4 (Week 1): pt will be able to rise to stand from elevated toilet with CGA.  Skilled Therapeutic Interventions/Progress Updates:    Pt resting in w/c upon arrival.  OT intervention with focus on functional amb with RW in room to access bathroom, toilet transfers, toleting, bed mobility, discharge planning, and activity tolerance to prepare for discharge home tomorrow.  Pt initially wanted to try sitting on standard toilet but after discussion pt decided he should BSC over toilet for now.  Pt states his toilet at home is standard height and he will need the additional height to be sate. Pt amb in room to bathroom and completed all toilting tasks at supervision level.  Pt returned to room and remained standing at sink to wash hands.  Pt c/o increased nausea with activity and requested to return to bed.  Bed mobility with supervision.  Pt able to reposition in bed without assistance.  Pt called nursing to request nausea medicine. Continued energy conservation education and home safety educaiton. Pt remained in bed with all needs within reach and bed alarm activated.   Therapy Documentation Precautions:  Precautions Precautions: Fall, Sternal Precaution Comments: right hip DJD Restrictions Weight Bearing Restrictions: No RLE Weight Bearing: Weight bearing as tolerated Other Position/Activity Restrictions: sternal precautions    Pain:  Pt denies  pain this morning  Therapy/Group: Individual Therapy  Leroy Libman 12/27/2018, 12:01 PM

## 2018-12-27 NOTE — Progress Notes (Signed)
Occupational Therapy Session Note  Patient Details  Name: Frank Love MRN: [id number] Date of Birth: 01/16/1956  Today's Date: 12/27/2018 OT Individual Time: 1022-1046 OT Individual Time Calculation (min): 24 min    Short Term Goals: Week 1:  OT Short Term Goal 1 (Week 1): Pt will be able to don pants over his feet with S with or without AE. OT Short Term Goal 2 (Week 1): Pt will be able to pull pants up/down over hips with S in standing. OT Short Term Goal 3 (Week 1): Pt will be able to transfer to toilet with RW with CGA. OT Short Term Goal 4 (Week 1): pt will be able to rise to stand from elevated toilet with CGA.  Skilled Therapeutic Interventions/Progress Updates:    1;1. Pt received in w/c with no pain reported. Pt ambulates from room to ADL apartment with RW and completes TTB transfer with S and VC for RW management. Pt provided with energy conservation handout and educated on strategies with adaptations for ADL/IADL. Pt verbalized understanding and able to name specific examples he would use at home. Exited session with pt seated in w/c, call light in reach and exit alarm on  Therapy Documentation Precautions:  Precautions Precautions: Fall, Sternal Precaution Comments: right hip DJD Restrictions Weight Bearing Restrictions: No RLE Weight Bearing: Weight bearing as tolerated Other Position/Activity Restrictions: sternal precautions  General:   Vital Signs:   Pain:   ADL: ADL Eating: Independent Grooming: Setup Where Assessed-Grooming: Wheelchair, Sitting at sink Upper Body Bathing: Setup Where Assessed-Upper Body Bathing: Wheelchair, Sitting at sink Lower Body Bathing: Moderate assistance Where Assessed-Lower Body Bathing: Sitting at sink, Wheelchair, Standing at sink Upper Body Dressing: Setup Where Assessed-Upper Body Dressing: Wheelchair Lower Body Dressing: Maximal assistance Where Assessed-Lower Body Dressing: Wheelchair, Standing at sink, Sitting at  sink Toileting: Moderate assistance Where Assessed-Toileting: Glass blower/designer: Moderate assistance Toilet Transfer Method: Stand pivot Toilet Transfer Equipment: Raised toilet seat Vision   Perception    Praxis   Exercises:   Other Treatments:     Therapy/Group: Individual Therapy  Tonny Branch 12/27/2018, 10:47 AM

## 2018-12-28 ENCOUNTER — Ambulatory Visit: Payer: No Typology Code available for payment source | Admitting: Cardiothoracic Surgery

## 2018-12-28 MED ORDER — COLCHICINE 0.6 MG PO TABS: 0.6000 mg | ORAL_TABLET | Freq: Every day | ORAL | 0 refills | Status: DC

## 2018-12-28 MED ORDER — CARVEDILOL 3.125 MG PO TABS: 3.1250 mg | ORAL_TABLET | Freq: Two times a day (BID) | ORAL | 1 refills | Status: DC

## 2018-12-28 MED ORDER — CLOPIDOGREL BISULFATE 75 MG PO TABS: 75.0000 mg | ORAL_TABLET | Freq: Every day | ORAL | 1 refills | Status: DC

## 2018-12-28 MED ORDER — FE FUMARATE-B12-VIT C-FA-IFC PO CAPS: 1.0000 | ORAL_CAPSULE | Freq: Two times a day (BID) | ORAL | 0 refills | Status: DC

## 2018-12-28 MED ORDER — SPIRONOLACTONE 25 MG PO TABS: 25.0000 mg | ORAL_TABLET | Freq: Every day | ORAL | 3 refills | Status: DC

## 2018-12-28 MED ORDER — POTASSIUM CHLORIDE CRYS ER 20 MEQ PO TBCR: 20.0000 meq | EXTENDED_RELEASE_TABLET | Freq: Every day | ORAL | 1 refills | Status: DC

## 2018-12-28 MED ORDER — TAB-A-VITE/IRON PO TABS: 1.0000 | ORAL_TABLET | Freq: Every day | ORAL | 1 refills | Status: DC

## 2018-12-28 MED ORDER — SENNOSIDES-DOCUSATE SODIUM 8.6-50 MG PO TABS: 2.0000 | ORAL_TABLET | Freq: Every evening | ORAL | 0 refills | Status: DC | PRN

## 2018-12-28 MED ORDER — FUROSEMIDE 40 MG PO TABS: 40.0000 mg | ORAL_TABLET | Freq: Every day | ORAL | 1 refills | Status: DC

## 2018-12-28 MED ORDER — DIGOXIN 125 MCG PO TABS: 0.1250 mg | ORAL_TABLET | Freq: Every day | ORAL | 1 refills | Status: DC

## 2018-12-28 MED ORDER — ATORVASTATIN CALCIUM 40 MG PO TABS: 40.0000 mg | ORAL_TABLET | Freq: Every day | ORAL | 1 refills | Status: DC

## 2018-12-28 MED ORDER — PROMETHAZINE HCL 12.5 MG PO TABS: 6.2500 mg | ORAL_TABLET | Freq: Three times a day (TID) | ORAL | 0 refills | Status: DC | PRN

## 2018-12-28 MED ORDER — TRAZODONE HCL 50 MG PO TABS: 25.0000 mg | ORAL_TABLET | Freq: Every evening | ORAL | 0 refills | Status: DC | PRN

## 2018-12-28 MED ORDER — PANTOPRAZOLE SODIUM 20 MG PO TBEC: 20.0000 mg | DELAYED_RELEASE_TABLET | Freq: Every day | ORAL | 1 refills | Status: DC

## 2018-12-28 MED ORDER — LOSARTAN POTASSIUM 25 MG PO TABS: 12.5000 mg | ORAL_TABLET | Freq: Every day | ORAL | 1 refills | Status: DC

## 2018-12-28 MED ORDER — NITROGLYCERIN 0.4 MG SL SUBL: 0.4000 mg | SUBLINGUAL_TABLET | SUBLINGUAL | 0 refills | Status: DC | PRN

## 2018-12-28 MED FILL — PANTOPRAZOLE SOD DR 20 MG T: 20 | 30 days supply | Qty: 30 | Fill #0

## 2018-12-28 MED FILL — ?FUROSEMIDE 40 MG TABLET: 40 | 30 days supply | Qty: 30 | Fill #0

## 2018-12-28 MED FILL — CLOPIDOGREL 75 MG TABLET: 75 | 30 days supply | Qty: 30 | Fill #0

## 2018-12-28 MED FILL — !COLCRYS 0.6 MG TABLET: 0.6 MG | 15 days supply | Qty: 15 | Fill #0

## 2018-12-28 MED FILL — ?ATORVASTATIN 40MG TABLET: 40 | 30 days supply | Qty: 30 | Fill #0

## 2018-12-28 MED FILL — ?DIGITEK 125 MCG TABLET: 125 | 30 days supply | Qty: 30 | Fill #0

## 2018-12-28 MED FILL — LOSARTAN POTASSIUM 25 MG TA: 25 | 30 days supply | Qty: 30 | Fill #0

## 2018-12-28 MED FILL — PROMETHAZINE 12.5 MG TABLET: 12.5 | 20 days supply | Qty: 30 | Fill #0

## 2018-12-28 MED FILL — ?CARVEDILOL 3.125 MG TABLET: 3.125 | 30 days supply | Qty: 60 | Fill #0

## 2018-12-28 MED FILL — POTASSIUM CL ER 20 MEQ TAB: 20 | 30 days supply | Qty: 30 | Fill #0

## 2018-12-28 MED FILL — NITROGLYCERIN 0.4 MG TAB SL: 0.4 | 10 days supply | Qty: 25 | Fill #0

## 2018-12-28 MED FILL — ?traZODONE HCL 50MG TAB: 50 | 30 days supply | Qty: 15 | Fill #0

## 2018-12-28 NOTE — Progress Notes (Signed)
Nutrition Education Note  RD consulted for nutrition education regarding a heart healthy, low sodium diet.  Spoke with pt and wife at bedside. Both pt and wife with multiple questions. All questions answered.  Lipid Panel     Component Value Date/Time   CHOL 182 08/03/2018 1024   TRIG 64 08/03/2018 1024   HDL 53 08/03/2018 1024   CHOLHDL 3.4 08/03/2018 1024   LDLCALC 116 (H) 08/03/2018 1024    RD provided "Heart Healthy Low Sodium Nutrition Therapy" handout from the Academy of Nutrition and Dietetics. Reviewed patient's dietary recall. Provided examples on ways to decrease sodium and fat intake in diet. Discouraged intake of processed foods and use of salt shaker. Encouraged fresh fruits and vegetables as well as whole grain sources of carbohydrates to maximize fiber intake. Teach back method used.  Provided examples on ways to decrease sodium intake in diet. Discouraged intake of processed foods and use of salt shaker. Encouraged fresh fruits and vegetables as well as whole grain sources of carbohydrates to maximize fiber intake.   RD discussed why it is important for patient to adhere to diet recommendations, and emphasized the role of fluids, foods to avoid, and importance of weighing self daily. Teach back method used.  Expect good compliance.  Body mass index is 26.84 kg/m. Pt meets criteria for overweight based on current BMI.  Current diet order is Heart Healthy/Carb Modified, patient is consuming approximately 100% of meals at this time. Labs and medications reviewed. No further nutrition interventions warranted at this time. RD contact information provided. If additional nutrition issues arise, please re-consult RD.    Gaynell Face, MS, RD, LDN Inpatient Clinical Dietitian Pager: [phone] Weekend/After Hours: [phone]

## 2018-12-28 NOTE — Discharge Instructions (Signed)
Inpatient Rehab Discharge Instructions  [place] Discharge date and time:  12/28/18  Activities/Precautions/ Functional Status: Activity: no lifting, driving, or strenuous exercise  till cleared by MD Diet: cardiac diet Wound Care: keep wound clean and dry Contact MD if you develop any problems with your incision/wound--redness, swelling, increase in pain, drainage or if you develop fever or chills.    Functional status:  ___ No restrictions     ___ Walk up steps independently _X__ 24/7 supervision/assistance   ___ Walk up steps with assistance ___ Intermittent supervision/assistance  ___ Bathe/dress independently ___ Walk with walker     ___ Bathe/dress with assistance ___ Walk Independently    ___ Shower independently ___ Walk with assistance    _X__ Shower with assistance _X__ No alcohol     ___ Return to work/school ________   COMMUNITY REFERRALS UPON DISCHARGE:    Home Health:   PT                     Agency:  [facility] Phone: [phone]    Medical Equipment/Items Ordered:  Commode and tub bench                                                       Agency/Supplier:  [facility] @ [phone]  Special Instructions: 1. Continue sternal precautions--no lifting/pushing/pulling items over 10 lbs. No raising arms over shoulder.   2. NO driving   My questions have been answered and I understand these instructions. I will adhere to these goals and the provided educational materials after my discharge from the hospital.  Patient/Caregiver Signature _______________________________ Date __________  Clinician Signature _______________________________________ Date __________  Please bring this form and your medication list with you to all your follow-up doctor's appointments.                               Low Sodium Nutrition Therapy   Eating less sodium can help you if you have high blood pressure, heart failure, or kidney or  liver disease.   Your body needs a little sodium, but too much sodium can cause your body to hold onto extra water. This extra water will raise your blood pressure and can cause damage to your heart, kidneys, or liver as they are forced to work harder.   Sometimes you can see how the extra fluid affects you because your hands, legs, or belly swell. You may also hold water around your heart and lungs, which makes it hard to breathe.   Even if you take medication for blood pressure or a water pill (diuretic) to remove fluid, it is still important to have less salt in your diet.   Check with your primary care provider before drinking alcohol since it may affect the amount of fluid in your body and how your heart, kidneys, or liver work.  Sodium in Food A low-sodium meal plan limits the sodium that you get from food and beverages to 1,500-2,000 milligrams (mg) per day. Salt is the main source of sodium. Read the nutrition label on the package to find out how much sodium is in one serving of a food.   Select foods with 140 milligrams (mg) of sodium or less per serving.   You may be  able to eat one or two servings of foods with a little more than 140 milligrams (mg) of sodium if you are closely watching how much sodium you eat in a day.   Check the serving size on the label. The amount of sodium listed on the label shows the amount in one serving of the food. So, if you eat more than one serving, you will get more sodium than the amount listed.  Tips Cutting Back on Sodium  Eat more fresh foods.   Fresh fruits and vegetables are low in sodium, as well as frozen vegetables and fruits that have no added juices or sauces.   Fresh meats are lower in sodium than processed meats, such as bacon, sausage, and hotdogs.   Not all processed foods are unhealthy, but some processed foods may have too much sodium.   Eat less salt at the table and when cooking. One of the ingredients in salt is sodium.    One teaspoon of table salt has 2,300 milligrams of sodium.   Leave the salt out of recipes for pasta, casseroles, and soups.  Be a Paramedic.   Food packages that say Salt-free, sodium-free, very low sodium, and low sodium have less than 140 milligrams of sodium per serving.   Beware of products identified as Unsalted, No Salt Added, Reduced Sodium, or Lower Sodium. These items may still be high in sodium. You should always check the nutrition label.  Add flavors to your food without adding sodium.   Try lemon juice, lime juice, or vinegar.   Dry or fresh herbs add flavor.   Buy a sodium-free seasoning blend or make your own at home.  You can purchase salt-free or sodium-free condiments like barbeque sauce in stores and online. Ask your registered dietitian nutritionist for recommendations and where to find them.   Eating in Restaurants  Choose foods carefully when you eat outside your home. Restaurant foods can be very high in sodium. Many restaurants provide nutrition facts on their menus or their websites. If you cannot find that information, ask your server. Let your server know that you want your food to be cooked without salt and that you would like your salad dressing and sauces to be served on the side.    Foods Recommended Food Group Foods Recommended  Grains Bread, bagels, rolls without salted tops Homemade bread made with reduced-sodium baking powder Cold cereals, especially shredded wheat and puffed rice Oats, grits, or cream of wheat Pastas, quinoa, and rice Popcorn, pretzels or crackers without salt Corn tortillas  Protein Foods Fresh meats and fish; [place] bacon (check the nutrition labels - make sure they are not packaged in a sodium solution) Canned or packed tuna (no more than 4 ounces at 1 serving) Beans and peas Soybeans) and tofu Eggs Nuts or nut butters without salt  Dairy Milk or milk powder Plant milks, such as rice and  soy Yogurt, including Greek yogurt Small amounts of natural cheese (blocks of cheese) or reduced-sodium cheese can be used in moderation. (Swiss, ricotta, and fresh mozzarella cheese are lower in sodium than the others) Cream Cheese Low sodium cottage cheese  Vegetables Fresh and frozen vegetables without added sauces or salt Homemade soups (without salt) Low-sodium, salt-free or sodium-free canned vegetables and soups  Fruit Fresh and canned fruits Dried fruits, such as raisins, cranberries, and prunes  Oils Tub or liquid margarine, regular or without salt Canola, corn, peanut, olive, safflower, or sunflower oils  Condiments Fresh or dried herbs  such as basil, bay leaf, dill, mustard (dry), nutmeg, paprika, parsley, rosemary, sage, or thyme.  Low sodium ketchup Vinegar  Lemon or lime juice Pepper, red pepper flakes, and cayenne. Hot sauce contains sodium, but if you use just a drop or two, it will not add up to much.  Salt-free or sodium-free seasoning mixes and marinades Simple salad dressings: vinegar and oil   Foods Not Recommended Food Group Foods Not Recommended  Grains Breads or crackers topped with salt Cereals (hot/cold) with more than 300 mg sodium per serving Biscuits, cornbread, and other quick breads prepared with baking soda Pre-packaged bread crumbs Seasoned and packaged rice and pasta mixes Self-rising flours  Protein Foods Cured meats: Bacon, ham, sausage, pepperoni and hot dogs Canned meats (chili, vienna sausage, or sardines) Smoked fish and meats Frozen meals that have more than 600 mg of sodium per serving Egg substitute (with added sodium)  Dairy Buttermilk Processed cheese spreads Cottage cheese (1 cup may have over 500 mg of sodium; look for low-sodium.) American or feta cheese Shredded Cheese has more sodium than blocks of cheese String cheese  Vegetables Canned vegetables (unless they are salt-free, sodium-free or low sodium) Frozen vegetables with  seasoning and sauces Sauerkraut and pickled vegetables Canned or dried soups (unless they are salt-free, sodium-free, or low sodium) [place] fries and onion rings  Fruit Dried fruits preserved with additives that have sodium  Oils Salted butter or margarine, all types of olives  Condiments Salt, sea salt, kosher salt, onion salt, and garlic salt Seasoning mixes with salt Bouillon cubes Ketchup Barbeque sauce and Worcestershire sauce unless low sodium Soy sauce Salsa, pickles, olives, relish Salad dressings: ranch, blue cheese, [place], and [place].   Low Sodium Sample 1-Day Menu  Breakfast  1 cup cooked oatmeal   1 slice whole wheat bread toast   1 tablespoon peanut butter without salt   1 banana   1 cup 1% milk  Lunch  Tacos made with: 2 corn tortillas    cup black beans, low sodium    cup roasted or grilled chicken (without skin)    avocado   Squeeze of lime juice   1 cup salad greens   1 tablespoon low-sodium salad dressing    cup strawberries   1 orange  Afternoon Snack  1/3 cup grapes   6 ounces yogurt  Evening Meal  3 ounces herb-baked fish   1 baked potato   2 teaspoons olive oil    cup cooked carrots   2 thick slices tomatoes on:   2 lettuce leaves   1 teaspoon olive oil   1 teaspoon balsamic vinegar   1 cup 1% milk  Evening Snack  1 apple    cup almonds without salt   Low-Sodium Vegetarian (Lacto-Ovo) Sample 1-Day Menu  Breakfast  1 cup cooked oatmeal   1 slice whole wheat toast   1 tablespoon peanut butter without salt   1 banana   1 cup 1% milk  Lunch  Tacos made with: 2 corn tortillas    cup black beans, low sodium    cup roasted or grilled chicken (without skin)    avocado   Squeeze of lime juice   1 cup salad greens   1 tablespoon low-sodium salad dressing    cup strawberries   1 orange  Evening Meal  Stir fry made with:  cup tofu   1 cup brown rice    cup broccoli    cup green beans  cup peppers    tablespoon peanut oil   1 orange   1 cup 1% milk  Evening Snack  4 strips celery   2 tablespoons hummus   1 hard-boiled egg   Low-Sodium Vegan Sample 1-Day Menu  Breakfast  1 cup cooked oatmeal   1 tablespoon peanut butter without salt   1 cup blueberries   1 cup soymilk fortified with calcium, vitamin B12, and vitamin D  Lunch  1 small whole wheat pita    cup cooked lentils   2 tablespoons hummus   4 carrot sticks   1 medium apple   1 cup soymilk fortified with calcium, vitamin B12, and vitamin D  Evening Meal  Stir fry made with:  cup tofu   1 cup brown rice    cup broccoli    cup green beans    cup peppers    tablespoon peanut oil   1 cup cantaloupe  Evening Snack  1 cup soy yogurt    cup mixed nuts  Copyright 2020  Academy of Nutrition and Dietetics. All rights reserved     Sodium Free Flavoring Tips   When cooking, the following items may be used for flavoring instead of salt or seasonings that contain sodium.  Remember: A little bit of spice goes a long way! Be careful not to overseason.  Spice Blend Recipe (makes about ? cup)  5 teaspoons onion powder   2 teaspoons garlic powder   2 teaspoons paprika   2 teaspoon dry mustard   1 teaspoon crushed thyme leaves    teaspoon white pepper    teaspoon celery seed  Food Item Flavorings  Beef Basil, bay leaf, caraway, curry, dill, dry mustard, garlic, grape jelly, green pepper, mace, marjoram, mushrooms (fresh), nutmeg, onion or onion powder, parsley, pepper, rosemary, sage  Chicken Basil, cloves, cranberries, mace, mushrooms (fresh), nutmeg, oregano, paprika, parsley, pineapple, saffron, sage, savory, tarragon, thyme, tomato, turmeric  Egg Chervil, curry, dill, dry mustard, garlic or garlic powder, green pepper, jelly, mushrooms (fresh), nutmeg, onion powder, paprika, parsley, rosemary, tarragon, tomato  Fish Basil, bay leaf, chervil, curry, dill,  dry mustard, green pepper, lemon juice, marjoram, mushrooms (fresh), paprika, pepper, tarragon, tomato, turmeric  Lamb Cloves, curry, dill, garlic or garlic powder, mace, mint, mint jelly, onion, oregano, parsley, pineapple, rosemary, tarragon, thyme  Pork Applesauce, basil, caraway, chives, cloves, garlic or garlic powder, onion or onion powder, rosemary, thyme  Veal Apricots, basil, bay leaf, currant jelly, curry, ginger, marjoram, mushrooms (fresh), oregano, paprika  Vegetables Basil, dill, garlic or garlic powder, ginger, lemon juice, mace, marjoram, nutmeg, onion or onion powder, tarragon, tomato, sugar or sugar substitute, salt-free salad dressing, vinegar  Desserts Allspice, anise, cinnamon, cloves, ginger, mace, nutmeg, vanilla extract, other extracts   Copyright 2020  Academy of Nutrition and Dietetics. All rights reserved

## 2018-12-28 NOTE — Plan of Care (Signed)
Problem: Consults Goal: RH SPINAL CORD INJURY PATIENT EDUCATION Description:  See Patient Education module for education specifics.  12/28/2018 1114 by Rodolph Bong, LPN Outcome: Adequate for Discharge 12/28/2018 1114 by Rodolph Bong, LPN Outcome: Progressing   Problem: SCI BOWEL ELIMINATION Goal: RH STG MANAGE BOWEL WITH ASSISTANCE Description: STG Manage Bowel with  mod I Assistance. 12/28/2018 1114 by Rodolph Bong, LPN Outcome: Adequate for Discharge 12/28/2018 1114 by Rodolph Bong, LPN Outcome: Progressing Goal: RH STG SCI MANAGE BOWEL WITH MEDICATION WITH ASSISTANCE Description: STG SCI Manage bowel with medication with  mod I assistance. 12/28/2018 1114 by Rodolph Bong, LPN Outcome: Adequate for Discharge 12/28/2018 1114 by Rodolph Bong, LPN Outcome: Progressing   Problem: SCI BLADDER ELIMINATION Goal: RH STG MANAGE BLADDER WITH ASSISTANCE Description: STG Manage Bladder With mod I Assistance 12/28/2018 1114 by Rodolph Bong, LPN Outcome: Adequate for Discharge 12/28/2018 1114 by Rodolph Bong, LPN Outcome: Progressing Goal: RH STG MANAGE BLADDER WITH EQUIPMENT WITH ASSISTANCE Description: STG Manage Bladder With Equipment With mod I Assistance 12/28/2018 1114 by Rodolph Bong, LPN Outcome: Adequate for Discharge 12/28/2018 1114 by Rodolph Bong, LPN Outcome: Progressing   Problem: RH SKIN INTEGRITY Goal: RH STG SKIN FREE OF INFECTION/BREAKDOWN Description: With mod I assist 12/28/2018 1114 by Rodolph Bong, LPN Outcome: Adequate for Discharge 12/28/2018 1114 by Rodolph Bong, LPN Outcome: Progressing Goal: RH STG MAINTAIN SKIN INTEGRITY WITH ASSISTANCE Description: STG Maintain Skin Integrity With  mod I Assistance. 12/28/2018 1114 by Rodolph Bong, LPN Outcome: Adequate for Discharge 12/28/2018 1114 by Rodolph Bong, LPN Outcome: Progressing Goal: RH STG ABLE TO PERFORM INCISION/WOUND CARE W/ASSISTANCE Description: STG Able To Perform Incision/Wound  Care With  min Assistance. 12/28/2018 1114 by Rodolph Bong, LPN Outcome: Adequate for Discharge 12/28/2018 1114 by Rodolph Bong, LPN Outcome: Progressing   Problem: RH SAFETY Goal: RH STG ADHERE TO SAFETY PRECAUTIONS W/ASSISTANCE/DEVICE Description: STG Adhere to Safety Precautions With cues/reminders  Assistance/Device. 12/28/2018 1114 by Rodolph Bong, LPN Outcome: Adequate for Discharge 12/28/2018 1114 by Rodolph Bong, LPN Outcome: Progressing   Problem: RH PAIN MANAGEMENT Goal: RH STG PAIN MANAGED AT OR BELOW PT'S PAIN GOAL Description: At or below level 5 12/28/2018 1114 by Rodolph Bong, LPN Outcome: Adequate for Discharge 12/28/2018 1114 by Rodolph Bong, LPN Outcome: Progressing   Problem: RH KNOWLEDGE DEFICIT SCI Goal: RH STG INCREASE KNOWLEDGE OF SELF CARE AFTER SCI Description: Patient and wife will be able to manage care at discharge independently using handouts and educational materials  12/28/2018 1114 by Rodolph Bong, LPN Outcome: Adequate for Discharge 12/28/2018 1114 by Rodolph Bong, LPN Outcome: Progressing   Problem: Consults Goal: RH GENERAL PATIENT EDUCATION Description: See Patient Education module for education specifics. 12/28/2018 1114 by Rodolph Bong, LPN Outcome: Adequate for Discharge 12/28/2018 1114 by Rodolph Bong, LPN Outcome: Progressing Goal: Skin Care Protocol Initiated - if Braden Score 18 or less Description: If consults are not indicated, leave blank or document N/A 12/28/2018 1114 by Rodolph Bong, LPN Outcome: Adequate for Discharge 12/28/2018 1114 by Rodolph Bong, LPN Outcome: Progressing Goal: Nutrition Consult-if indicated 12/28/2018 1114 by Rodolph Bong, LPN Outcome: Adequate for Discharge 12/28/2018 1114 by Rodolph Bong, LPN Outcome: Progressing   Problem: RH BOWEL ELIMINATION Goal: RH STG MANAGE BOWEL WITH ASSISTANCE Description: STG Manage Bowel with  min Assistance. 12/28/2018 1114 by Rodolph Bong,  LPN Outcome: Adequate for Discharge 12/28/2018  1114 by Rodolph Bong, LPN Outcome: Progressing Goal: RH STG MANAGE BOWEL W/MEDICATION W/ASSISTANCE Description: STG Manage Bowel with Medication with Assistance. 12/28/2018 1114 by Rodolph Bong, LPN Outcome: Adequate for Discharge 12/28/2018 1114 by Rodolph Bong, LPN Outcome: Progressing   Problem: RH BLADDER ELIMINATION Goal: RH STG MANAGE BLADDER WITH ASSISTANCE Description: STG Manage Bladder With min Assistance 12/28/2018 1114 by Rodolph Bong, LPN Outcome: Adequate for Discharge 12/28/2018 1114 by Rodolph Bong, LPN Outcome: Progressing   Problem: RH SKIN INTEGRITY Goal: RH STG SKIN FREE OF INFECTION/BREAKDOWN Description: Skin free from infection entire stay on rehab 12/28/2018 1114 by Rodolph Bong, LPN Outcome: Adequate for Discharge 12/28/2018 1114 by Rodolph Bong, LPN Outcome: Progressing   Problem: RH SAFETY Goal: RH STG ADHERE TO SAFETY PRECAUTIONS W/ASSISTANCE/DEVICE Description: STG Adhere to Safety Precautions With Assistance/Device. 12/28/2018 1114 by Rodolph Bong, LPN Outcome: Adequate for Discharge 12/28/2018 1114 by Rodolph Bong, LPN Outcome: Progressing   Problem: RH PAIN MANAGEMENT Goal: RH STG PAIN MANAGED AT OR BELOW PT'S PAIN GOAL Description: Pain less than 2 12/28/2018 1114 by Rodolph Bong, LPN Outcome: Adequate for Discharge 12/28/2018 1114 by Rodolph Bong, LPN Outcome: Progressing   Problem: RH KNOWLEDGE DEFICIT GENERAL Goal: RH STG INCREASE KNOWLEDGE OF SELF CARE AFTER HOSPITALIZATION 12/28/2018 1114 by Rodolph Bong, LPN Outcome: Adequate for Discharge 12/28/2018 1114 by Rodolph Bong, LPN Outcome: Progressing   Problem: Sit to Stand Goal: LTG:  Patient will perform sit to stand with assistance level (PT) Description: LTG:  Patient will perform sit to stand with assistance level (PT) Outcome: Adequate for Discharge   Problem: Food- and Nutrition-Related Knowledge Deficit  (NB-1.1) Goal: Nutrition education Description: Formal process to instruct or train a patient/client in a skill or to impart knowledge to help patients/clients voluntarily manage or modify food choices and eating behavior to maintain or improve health. Outcome: Adequate for Discharge

## 2018-12-28 NOTE — Plan of Care (Signed)
  Problem: Consults Goal: RH SPINAL CORD INJURY PATIENT EDUCATION Description:  See Patient Education module for education specifics.  Outcome: Progressing   Problem: SCI BOWEL ELIMINATION Goal: RH STG MANAGE BOWEL WITH ASSISTANCE Description: STG Manage Bowel with  mod I Assistance. Outcome: Progressing Goal: RH STG SCI MANAGE BOWEL WITH MEDICATION WITH ASSISTANCE Description: STG SCI Manage bowel with medication with  mod I assistance. Outcome: Progressing   Problem: SCI BLADDER ELIMINATION Goal: RH STG MANAGE BLADDER WITH ASSISTANCE Description: STG Manage Bladder With mod I Assistance Outcome: Progressing Goal: RH STG MANAGE BLADDER WITH EQUIPMENT WITH ASSISTANCE Description: STG Manage Bladder With Equipment With mod I Assistance Outcome: Progressing   Problem: RH SKIN INTEGRITY Goal: RH STG SKIN FREE OF INFECTION/BREAKDOWN Description: With mod I assist Outcome: Progressing Goal: RH STG MAINTAIN SKIN INTEGRITY WITH ASSISTANCE Description: STG Maintain Skin Integrity With  mod I Assistance. Outcome: Progressing Goal: RH STG ABLE TO PERFORM INCISION/WOUND CARE W/ASSISTANCE Description: STG Able To Perform Incision/Wound Care With  min Assistance. Outcome: Progressing   Problem: RH SAFETY Goal: RH STG ADHERE TO SAFETY PRECAUTIONS W/ASSISTANCE/DEVICE Description: STG Adhere to Safety Precautions With cues/reminders  Assistance/Device. Outcome: Progressing   Problem: RH PAIN MANAGEMENT Goal: RH STG PAIN MANAGED AT OR BELOW PT'S PAIN GOAL Description: At or below level 5 Outcome: Progressing   Problem: RH KNOWLEDGE DEFICIT SCI Goal: RH STG INCREASE KNOWLEDGE OF SELF CARE AFTER SCI Description: Patient and wife will be able to manage care at discharge independently using handouts and educational materials  Outcome: Progressing   Problem: Consults Goal: RH GENERAL PATIENT EDUCATION Description: See Patient Education module for education specifics. Outcome:  Progressing Goal: Skin Care Protocol Initiated - if Braden Score 18 or less Description: If consults are not indicated, leave blank or document N/A Outcome: Progressing Goal: Nutrition Consult-if indicated Outcome: Progressing   Problem: RH BOWEL ELIMINATION Goal: RH STG MANAGE BOWEL WITH ASSISTANCE Description: STG Manage Bowel with  min Assistance. Outcome: Progressing Goal: RH STG MANAGE BOWEL W/MEDICATION W/ASSISTANCE Description: STG Manage Bowel with Medication with Assistance. Outcome: Progressing   Problem: RH BLADDER ELIMINATION Goal: RH STG MANAGE BLADDER WITH ASSISTANCE Description: STG Manage Bladder With min Assistance Outcome: Progressing   Problem: RH SKIN INTEGRITY Goal: RH STG SKIN FREE OF INFECTION/BREAKDOWN Description: Skin free from infection entire stay on rehab Outcome: Progressing   Problem: RH SAFETY Goal: RH STG ADHERE TO SAFETY PRECAUTIONS W/ASSISTANCE/DEVICE Description: STG Adhere to Safety Precautions With Assistance/Device. Outcome: Progressing   Problem: RH PAIN MANAGEMENT Goal: RH STG PAIN MANAGED AT OR BELOW PT'S PAIN GOAL Description: Pain less than 2 Outcome: Progressing   Problem: RH KNOWLEDGE DEFICIT GENERAL Goal: RH STG INCREASE KNOWLEDGE OF SELF CARE AFTER HOSPITALIZATION Outcome: Progressing

## 2018-12-28 NOTE — Progress Notes (Signed)
[facility] PHYSICAL MEDICINE & REHABILITATION PROGRESS NOTE   Subjective/Complaints:   Ready for d/c today- was asking about nutrition- had 5+ lbs. weight loss from Lasix yesterday.   ROS- denies CP, SOB, abd pain, N/V/D, and HA.  Objective:   No results found. No results for input(s): WBC, HGB, HCT, PLT in the last 72 hours. No results for input(s): NA, K, CL, CO2, GLUCOSE, BUN, CREATININE, CALCIUM in the last 72 hours.  Intake/Output Summary (Last 24 hours) at 12/28/2018 0847 Last data filed at 12/28/2018 0700 Gross per 24 hour  Intake 480 ml  Output -  Net 480 ml     Physical Exam: Vital Signs Blood pressure (!) 107/57, pulse 60, temperature 99 F (37.2 C), temperature source Oral, resp. rate 17, height 6\' 3"  (1.905 m), weight 97.4 kg, SpO2 96 %.  Physical Exam Nursing note, labs, and vitalsreviewed. Constitutional: awake, alert, appropriate, in room; sitting up in bed; NAD  HENT:  Head:Normocephalicand atraumatic.  Eyes:EOMI B/L; conjugate gaze  Neck:Normal range of motion.  Cardiovascular:Normal rateand regular rhythm.  Respiratory:Effort normal. No respiratory distress. He is CTA B/L  Chest wall with staples out and steristrips  in place-  GI: He exhibitsno distension. There isno abdominal tenderness.  Genitourinary: Genitourinary Comments: Scrotal edema Musculoskeletal:  Comments: RLE weakness with decreased ROM due to right hip pain and min edema right knee. Neurological: He is alertand oriented to person, place, and time. Nocranial nerve deficit.Coordinationnormal.  UE motor 4/5. LE 4- to 4/5 prox to distal. No sensory findings. Cognitively appropriate. Skin:No rashnoted. He is not diaphoretic. Noerythema.  Psychiatric: He has anormal mood and affect.   Assessment/Plan: 1. 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  Physiatrist is providing close team supervision and 24 hour management of active medical problems listed below.  Physiatrist and rehab team continue to assess barriers to discharge/monitor patient progress toward functional and medical goals  Care Tool:  Bathing    Body parts bathed by patient: Right arm, Chest, Left arm, Abdomen, Front perineal area, Right upper leg, Left upper leg, Face, Buttocks, Right lower leg, Left lower leg   Body parts bathed by helper: Buttocks, Right lower leg, Left lower leg     Bathing assist Assist Level: Supervision/Verbal cueing     Upper Body Dressing/Undressing Upper body dressing   What is the patient wearing?: Pull over shirt    Upper body assist Assist Level: Independent    Lower Body Dressing/Undressing Lower body dressing      What is the patient wearing?: Underwear/pull up, Pants     Lower body assist Assist for lower body dressing: Supervision/Verbal cueing     Toileting Toileting    Toileting assist Assist for toileting: Supervision/Verbal cueing     Transfers Chair/bed transfer  Transfers assist     Chair/bed transfer assist level: Supervision/Verbal cueing Chair/bed transfer assistive device: Armrests, Programmer, multimedia   Ambulation assist      Assist level: Supervision/Verbal cueing Assistive device: Walker-rolling Max distance: 120'   Walk 10 feet activity   Assist     Assist level: Supervision/Verbal cueing Assistive device: Walker-rolling   Walk 50 feet activity   Assist Walk 50 feet with 2 turns activity did not occur: Safety/medical concerns(decreased strength/activity tolerance)  Assist level: Supervision/Verbal cueing Assistive device: Walker-rolling    Walk 150 feet activity   Assist Walk 150 feet activity did not  occur: Safety/medical concerns(endurance)         Walk 10 feet on uneven surface  activity   Assist Walk 10 feet on uneven surfaces  activity did not occur: Safety/medical concerns(decreased strength/activity tolerance)   Assist level: Contact Guard/Touching assist Assistive device: Aeronautical engineer Will patient use wheelchair at discharge?: No(primary ambulator; s/p CABG)   Wheelchair activity did not occur: Safety/medical concerns(sternal precautions)  Wheelchair assist level: Dependent - Patient 0%      Wheelchair 50 feet with 2 turns activity    Assist    Wheelchair 50 feet with 2 turns activity did not occur: Safety/medical concerns(sternal precautions)   Assist Level: Dependent - Patient 0%   Wheelchair 150 feet activity     Assist  Wheelchair 150 feet activity did not occur: Safety/medical concerns(sternal precautions)   Assist Level: Dependent - Patient 0%   Blood pressure (!) 107/57, pulse 60, temperature 99 F (37.2 C), temperature source Oral, resp. rate 17, height 6\' 3"  (1.905 m), weight 97.4 kg, SpO2 96 %.  Medical Problem List and Plan: 1.Functional and mobility deficitssecondary to CABG x4 and associated post-op complications with subsequent debility -patient may shower -ELOS/Goals: 7-11 days, mod I goals 2. Antithrombotics: -DVT/anticoagulation:Mechanical:Sequential compression devices, below kneeBilateral lower extremities -antiplatelet therapy: ASA/PLavix 3.Chronic right hip pain/Pain Management:tylenol prn. Has not used any oxycodone in 48 hrs--continue prn.  4. Mood:LCSW to follow for evaluation and support. -antipsychotic agents: N/A 5. Neuropsych: This patientiscapable of making decisions onhisown behalf. 6. Skin/Wound Care:Monitor incisions for healing. Continue MVI. Add protein supplement.Per CVTA- pacing sutures to come out Monday and staples to be d/c 12/1. 7. Fluids/Electrolytes/Nutrition:Strict I/O. Check lytes in am.  8. Acute on chronic systolic CHF: TEDs for peripheral  edema.   ON ASA,Lipitor,coreg bid, lanoxin, lasix 40 mg daily and Spironolactone to 25 mg on 11/27             -check daily weights   Filed Weights   12/26/18 0553 12/27/18 0435 12/28/18 0520  Weight: 105.1 kg 106.8 kg 97.4 kg   11/29- will monitor for trends and monitor daily  12/2- weight 105kg- lower than when got here. Will con't Meds but hold carvedilol today due to low BP.  12/3- will give 1 dose additional Lasix due to weight up to 106.8 kg today.   12/4- down 9kg in 1 day with Lasix 40 mg  9. ABLA: Recheck in am. Continue iron supplement.  10. Pre-renal azotemia: stable 34-->28. Will monitor for now--recheck labs tomorrow  11/28- BUN 23- improved  12/1- BUN 25- will encourage fluid intake 11. Thrombocytopenia: Resolving.  11/28- Plts in 220s       LOS: 7 days A FACE TO FACE EVALUATION WAS PERFORMED  Frank Love 12/28/2018, 8:47 AM

## 2018-12-28 NOTE — Progress Notes (Signed)
Social Work Discharge Note   The overall goal for the admission was met for:   Discharge location: Yes - home with wife who can provide 24/7 support  Length of Stay: Yes - 7 days  Discharge activity level: Yes - supervision  Home/community participation: Yes  Services provided included: MD, RD, PT, OT, RN, Pharmacy and SW  Financial Services: None - Medicaid application pending   Follow-up services arranged: Home Health: PT via [facility], DME: 3n1 commode and tub bench via [facility] and Patient/Family has no preference for HH/DME agencies  Comments (or additional information):    Contact person:  Pt @ [phone] or wife, Sylvia @ [phone]  Patient/Family verbalized understanding of follow-up arrangements: Yes  Individual responsible for coordination of the follow-up plan: pt  Confirmed correct DME delivered: ,  12/28/2018    ,  

## 2018-12-31 ENCOUNTER — Ambulatory Visit: Payer: No Typology Code available for payment source | Admitting: Cardiothoracic Surgery

## 2018-12-31 DIAGNOSIS — D62 Acute posthemorrhagic anemia: Secondary | ICD-10-CM

## 2018-12-31 NOTE — Plan of Care (Signed)
Problem: SCI BOWEL ELIMINATION Goal: RH STG MANAGE BOWEL WITH ASSISTANCE Description: STG Manage Bowel with  mod I Assistance. 12/31/2018 1152 by Brita Romp, RN Outcome: Completed/Met 12/31/2018 1151 by Brita Romp, RN Reactivated Goal: RH STG SCI MANAGE BOWEL WITH MEDICATION WITH ASSISTANCE Description: STG SCI Manage bowel with medication with  mod I assistance. 12/31/2018 1152 by Brita Romp, RN Outcome: Completed/Met 12/31/2018 1151 by Brita Romp, RN Reactivated   Problem: SCI BLADDER ELIMINATION Goal: RH STG MANAGE BLADDER WITH ASSISTANCE Description: STG Manage Bladder With mod I Assistance 12/31/2018 1152 by Brita Romp, RN Outcome: Completed/Met 12/31/2018 1151 by Brita Romp, RN Reactivated Goal: RH STG MANAGE BLADDER WITH EQUIPMENT WITH ASSISTANCE Description: STG Manage Bladder With Equipment With mod I Assistance 12/31/2018 1152 by Brita Romp, RN Outcome: Completed/Met 12/31/2018 1151 by Brita Romp, RN Reactivated   Problem: RH SKIN INTEGRITY Goal: RH STG SKIN FREE OF INFECTION/BREAKDOWN Description: With mod I assist 12/31/2018 1152 by Brita Romp, RN Outcome: Completed/Met 12/31/2018 1151 by Brita Romp, RN Reactivated Goal: RH STG MAINTAIN SKIN INTEGRITY WITH ASSISTANCE Description: STG Maintain Skin Integrity With  mod I Assistance. 12/31/2018 1152 by Brita Romp, RN Outcome: Completed/Met 12/31/2018 1151 by Brita Romp, RN Reactivated Goal: RH STG ABLE TO PERFORM INCISION/WOUND CARE W/ASSISTANCE Description: STG Able To Perform Incision/Wound Care With  min Assistance. 12/31/2018 1152 by Brita Romp, RN Outcome: Completed/Met 12/31/2018 1151 by Brita Romp, RN Reactivated   Problem: RH SAFETY Goal: RH STG ADHERE TO SAFETY PRECAUTIONS W/ASSISTANCE/DEVICE Description: STG Adhere to Safety Precautions With cues/reminders  Assistance/Device. 12/31/2018 1152 by  Brita Romp, RN Outcome: Completed/Met 12/31/2018 1151 by Brita Romp, RN Reactivated   Problem: RH PAIN MANAGEMENT Goal: RH STG PAIN MANAGED AT OR BELOW PT'S PAIN GOAL Description: At or below level 5 12/31/2018 1152 by Brita Romp, RN Outcome: Completed/Met 12/31/2018 1151 by Brita Romp, RN Reactivated   Problem: RH KNOWLEDGE DEFICIT SCI Goal: RH STG INCREASE KNOWLEDGE OF SELF CARE AFTER SCI Description: Patient and wife will be able to manage care at discharge independently using handouts and educational materials  12/31/2018 1152 by Brita Romp, RN Outcome: Completed/Met 12/31/2018 1151 by Brita Romp, RN Reactivated   Problem: RH BOWEL ELIMINATION Goal: RH STG MANAGE BOWEL WITH ASSISTANCE Description: STG Manage Bowel with  min Assistance. 12/31/2018 1152 by Brita Romp, RN Outcome: Completed/Met 12/31/2018 1151 by Brita Romp, RN Reactivated Goal: RH STG MANAGE BOWEL W/MEDICATION W/ASSISTANCE Description: STG Manage Bowel with Medication with Assistance. 12/31/2018 1152 by Brita Romp, RN Outcome: Completed/Met 12/31/2018 1151 by Brita Romp, RN Reactivated   Problem: RH BLADDER ELIMINATION Goal: RH STG MANAGE BLADDER WITH ASSISTANCE Description: STG Manage Bladder With min Assistance 12/31/2018 1152 by Brita Romp, RN Outcome: Completed/Met 12/31/2018 1151 by Brita Romp, RN Reactivated   Problem: RH SKIN INTEGRITY Goal: RH STG SKIN FREE OF INFECTION/BREAKDOWN Description: Skin free from infection entire stay on rehab 12/31/2018 1152 by Brita Romp, RN Outcome: Completed/Met 12/31/2018 1151 by Brita Romp, RN Reactivated   Problem: RH SAFETY Goal: RH STG ADHERE TO SAFETY PRECAUTIONS W/ASSISTANCE/DEVICE Description: STG Adhere to Safety Precautions With Assistance/Device. 12/31/2018 1152 by Brita Romp, RN Outcome: Completed/Met 12/31/2018 1151 by Brita Romp, RN Reactivated   Problem: RH PAIN MANAGEMENT Goal: RH STG PAIN MANAGED AT OR BELOW PT'S PAIN GOAL Description: Pain less than 2 12/31/2018 1152  by Brita Romp, RN Outcome: Completed/Met 12/31/2018 1151 by Brita Romp, RN Reactivated   Problem: RH KNOWLEDGE DEFICIT GENERAL Goal: RH STG INCREASE KNOWLEDGE OF SELF CARE AFTER HOSPITALIZATION 12/31/2018 1152 by Brita Romp, RN Outcome: Completed/Met 12/31/2018 1151 by Brita Romp, RN Reactivated

## 2019-01-01 ENCOUNTER — Telehealth: Payer: Self-pay

## 2019-01-01 NOTE — Telephone Encounter (Signed)
  TRANSITIONAL CARE CALL  Appointment Date and Time: 9:00am for 9:20am on 12.21.2020 With: Dr Dagoberto Ligas   Questions for our staff to ask patients on Transitional care 48 hour phone call:   1. Are you/is patient experiencing any problems since coming home?  NO     Are there any questions regarding any aspect of care?  NO  2. Are there any questions regarding medications administration/dosing?  NO     Are meds being taken as prescribed? YES  3. Have there been any falls? NO  4. Has Home Health (Home Health: PT via [facility], ) been to the house and/or have they contacted you?      If not, have you tried to contact them? NA     Can we help you contact them? NA  5. Are bowels and bladder emptying properly? YES     Are there any unexpected incontinence issues? NO     If applicable, is patient following bowel/bladder programs? NA  6. Any fevers, problems with breathing, unexpected pain? NO  7. Are there any skin problems or new areas of breakdown? NO  8. Has the patient/family member arranged specialty MD follow up (ie cardiology/neurology/renal/surgical/etc)?      Can we help arrange? YES  9. Does the patient need any other services or support that we can help arrange? NO  10. Are caregivers following through as expected in assisting the patient? YES  11. Has the patient quit smoking, drinking alcohol, or using drugs as recommended? NA  [facility] [address] [phone]

## 2019-01-02 DIAGNOSIS — I6529 Occlusion and stenosis of unspecified carotid artery: Secondary | ICD-10-CM | POA: Insufficient documentation

## 2019-01-02 DIAGNOSIS — Z7189 Other specified counseling: Secondary | ICD-10-CM | POA: Insufficient documentation

## 2019-01-02 NOTE — Progress Notes (Signed)
Cardiology Office Note   Date:  01/03/2019   ID:  Frank Love, DOB 1955-11-29, MRN [id number]  PCP:  Courtney Heys, MD  Cardiologist:   [name], MD   Chief Complaint  Patient presents with   Coronary Artery Disease      History of Present Illness: Frank Love is a 63 y.o. male who presents for follow up of CAD and cardiomyopathy.  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  I reviewed these records for this visit.  He did have increased RV dysfunction noted at the time of surgery.  He did have hypotension but was able to be taken off of midodrine and med for HF titrated prior to transfer to rehab.    He has done remarkably well.  He is home with his wife.  He is getting around with a walker.  He is very limited by his hip pain and hopes to have that fixed in the not too distant future.  He has not had any presyncope or syncope.  He has not had any chest pressure, neck or arm discomfort.  He has had nausea that he thinks is related to one of his medicines that he is not sure which one.  He has not had any vomiting.  He has had some slow weight loss although he is eating.  He sleeps chronically in a chair because of back pain but he is not describing any new PND or orthopnea.  Has had no palpitations.  He has some mild incisional chest pain but he is not having any fevers or chills.   Past Medical History:  Diagnosis Date   Chronic pain of right knee    Coronary artery disease    Hypertension    Phimosis     Past Surgical History:  Procedure Laterality Date   CIRCUMCISION N/A 10/12/2018   Procedure: CIRCUMCISION ADULT;  Surgeon: Franchot Gallo, MD;  Location: [facility];  Service: Urology;  Laterality: N/A;  45 mins   COLONOSCOPY     CORONARY ARTERY BYPASS GRAFT N/A 12/12/2018   Procedure: CORONARY ARTERY BYPASS GRAFTING (CABG) x four, using bilateral internal mammary arteries and right leg greater saphenous vein harvested  endoscopically;  Surgeon: [name], MD;  Location: [facility];  Service: Open Heart Surgery;  Laterality: N/A;   LEFT HEART CATH AND CORONARY ANGIOGRAPHY N/A 11/28/2018   Procedure: LEFT HEART CATH AND CORONARY ANGIOGRAPHY;  Surgeon: Burnell Blanks, MD;  Location: [facility];  Service: Cardiovascular;  Laterality: N/A;   MEDIASTINAL EXPLORATION N/A 12/12/2018   Procedure: Mediastinal Re-Exploration after bypass graft;  Surgeon: [name], MD;  Location: [facility];  Service: Open Heart Surgery;  Laterality: N/A;   TEE WITHOUT CARDIOVERSION N/A 12/12/2018   Procedure: TRANSESOPHAGEAL ECHOCARDIOGRAM (TEE);  Surgeon: [name], MD;  Location: [facility];  Service: Open Heart Surgery;  Laterality: N/A;   TOOTH EXTRACTION       Current Outpatient Medications  Medication Sig Dispense Refill   aspirin EC 81 MG tablet Take 81 mg by mouth at bedtime.      atorvastatin (LIPITOR) 40 MG tablet Take 1 tablet (40 mg total) by mouth daily at 6 PM. 30 tablet 1   carvedilol (COREG) 6.25 MG tablet Take 1 tablet (6.25 mg total) by mouth 2 (two) times daily with a meal. 180 tablet 3   clopidogrel (PLAVIX) 75 MG tablet Take 1 tablet (75 mg total)  by mouth daily. 30 tablet 1   digoxin (LANOXIN) 0.125 MG tablet Take 1 tablet (0.125 mg total) by mouth daily. 30 tablet 1   ferrous [id number] C-folic acid (TRINSICON / FOLTRIN) capsule Take 1 capsule by mouth 2 (two) times daily after a meal. 60 capsule 0   furosemide (LASIX) 40 MG tablet Take 1 tablet (40 mg total) by mouth daily. . 30 tablet 1   losartan (COZAAR) 25 MG tablet Take 0.5 tablets (12.5 mg total) by mouth at bedtime. 30 tablet 1   Multiple Vitamins-Iron (MULTIVITAMINS WITH IRON) TABS tablet Take 1 tablet by mouth daily. 30 tablet 1   nitroGLYCERIN (NITROSTAT) 0.4 MG SL tablet Place 1 tablet (0.4 mg total) under the tongue every 5 (five) minutes as needed for chest pain. NO more than 3 pills--if you have the  need to take 2 pills or more call MD 30 tablet 0   pantoprazole (PROTONIX) 20 MG tablet Take 1 tablet (20 mg total) by mouth daily. 30 tablet 1   potassium chloride SA (KLOR-CON) 20 MEQ tablet Take 1 tablet (20 mEq total) by mouth daily. 30 tablet 1   promethazine (PHENERGAN) 12.5 MG tablet Take 0.5 tablets (6.25 mg total) by mouth every 8 (eight) hours as needed for nausea or vomiting. 30 tablet 0   senna-docusate (SENOKOT-S) 8.6-50 MG tablet Take 2 tablets by mouth at bedtime as needed for mild constipation. 60 tablet 0   spironolactone (ALDACTONE) 25 MG tablet Take 1 tablet (25 mg total) by mouth daily. 30 tablet 3   traZODone (DESYREL) 50 MG tablet Take 0.5 tablets (25 mg total) by mouth at bedtime as needed for sleep. 30 tablet 0   No current facility-administered medications for this visit.    Allergies:   Patient has no known allergies.    ROS:  Please see the history of present illness.   Otherwise, review of systems are positive for none.   All other systems are reviewed and negative.    PHYSICAL EXAM: VS:  BP 109/64    Pulse 79    Temp (!) 96.1 F (35.6 C)    Ht 6\' 3"  (1.905 m)    Wt 214 lb 6.4 oz (97.3 kg)    SpO2 97%    BMI 26.80 kg/m  , BMI Body mass index is 26.8 kg/m. GENERAL:  Well appearing NECK:  No jugular venous distention, waveform within normal limits, carotid upstroke brisk and symmetric, no bruits, no thyromegaly LUNGS: Pleuritic rub on the right posterior with some dullness at the bases CHEST: Healing sternotomy scar without drainage. HEART:  PMI not displaced or sustained,S1 and S2 within normal limits, no S3, no S4, no clicks, no rubs, no murmurs ABD:  Flat, positive bowel sounds normal in frequency in pitch, no bruits, no rebound, no guarding, no midline pulsatile mass, no hepatomegaly, no splenomegaly EXT:  2 plus pulses throughout, no edema, no cyanosis no clubbing   EKG:  EKG is not ordered today.    Recent Labs: 08/03/2018: BNP  316.2 12/13/2018: Magnesium 2.3 12/22/2018: ALT 22 12/24/2018: BUN 25; Creatinine, Ser 1.21; Hemoglobin 9.0; Platelets 201; Potassium 3.9; Sodium 140    Lipid Panel    Component Value Date/Time   CHOL 182 08/03/2018 1024   TRIG 64 08/03/2018 1024   HDL 53 08/03/2018 1024   CHOLHDL 3.4 08/03/2018 1024   LDLCALC 116 (H) 08/03/2018 1024      Wt Readings from Last 3 Encounters:  01/03/19 214 lb 6.4 oz (97.3 kg)  12/28/18 214 lb 11.7 oz (97.4 kg)  12/21/18 231 lb 14.4 oz (105.2 kg)     Intervention    Other studies Reviewed: Additional studies/ records that were reviewed today include: Hospital records. Review of the above records demonstrates:  Please see elsewhere in the note.     ASSESSMENT AND PLAN:  CABG:   The patient's done remarkably well given his slightly frail state going into.  I am going adjust his meds as listed.  He was sent home on colchicine which may be contributing to his nausea so I will discontinue this.  Do not suspect any pericarditis .  CAROTID STENOSIS:  He has moderate carotid stenosis and I will follow this in one year with a Doppler.   ISCHEMIC CARDIOMYOPATHY: I think I can gently titrate his beta-blocker by increasing carvedilol 3.125 mg twice daily.  Check a basic metabolic profile and a CBC today.  COVID EDUCATION: He does not like to take the flu shot but he thinks he would take the Covid vaccine.  We talked about vaccination.   Current medicines are reviewed at length with the patient today.  The patient does not have concerns regarding medicines.  The following changes have been made:  As above  Labs/ tests ordered today include:   Orders Placed This Encounter  Procedures   CBC   Basic metabolic panel     Disposition:   FU with me in one month.     Signed, [name], MD  01/03/2019 2:54 PM    [facility]

## 2019-01-03 ENCOUNTER — Other Ambulatory Visit: Payer: Self-pay

## 2019-01-03 ENCOUNTER — Ambulatory Visit (INDEPENDENT_AMBULATORY_CARE_PROVIDER_SITE_OTHER): Payer: Self-pay | Admitting: Cardiology

## 2019-01-03 ENCOUNTER — Telehealth (HOSPITAL_COMMUNITY): Payer: Self-pay | Admitting: Student

## 2019-01-03 ENCOUNTER — Encounter: Payer: Self-pay | Admitting: Cardiology

## 2019-01-03 ENCOUNTER — Other Ambulatory Visit: Payer: Self-pay | Admitting: Cardiothoracic Surgery

## 2019-01-03 VITALS — BP 109/64 | HR 79 | Temp 96.1°F | Ht 75.0 in | Wt 214.4 lb

## 2019-01-03 DIAGNOSIS — I2510 Atherosclerotic heart disease of native coronary artery without angina pectoris: Secondary | ICD-10-CM

## 2019-01-03 DIAGNOSIS — Z951 Presence of aortocoronary bypass graft: Secondary | ICD-10-CM

## 2019-01-03 DIAGNOSIS — Z7189 Other specified counseling: Secondary | ICD-10-CM

## 2019-01-03 DIAGNOSIS — I255 Ischemic cardiomyopathy: Secondary | ICD-10-CM

## 2019-01-03 DIAGNOSIS — I6529 Occlusion and stenosis of unspecified carotid artery: Secondary | ICD-10-CM

## 2019-01-03 MED ORDER — CARVEDILOL 6.25 MG PO TABS: 6.2500 mg | ORAL_TABLET | Freq: Two times a day (BID) | ORAL | 3 refills | Status: DC

## 2019-01-03 NOTE — Telephone Encounter (Signed)
   Received page from Answering Service that patient had question about Colchicine following office visit today. Called and spoke with patient. Patient did not see a prescription of Colchicine at home and was wondering if there was another name for it. He states he does have a prescription for Colcrys and I told him that was the brand name for Colchcine. Advised him to stop this as directed at office visit with Dr. Percival Spanish earlier today. Patient voice understanding and thanked me for calling back.  Darreld Mclean, PA-C 01/03/2019 6:42 PM

## 2019-01-03 NOTE — Patient Instructions (Signed)
Medication Instructions:  Your physician has recommended you make the following change in your medication:   STOP TAKING YOUR COLCHICINE  INCREASE YOUR CARVEDILOL TO 6.25 MG BY MOUTH TWICE A DAY  *If you need a refill on your cardiac medications before your next appointment, please call your pharmacy*  Lab Work: Your physician recommends that you return for lab work TODAY: [place]    If you have labs (blood work) drawn today and your tests are completely normal, you will receive your results only by: Marland Kitchen MyChart Message (if you have MyChart) OR . A paper copy in the mail If you have any lab test that is abnormal or we need to change your treatment, we will call you to review the results.  Testing/Procedures: NONE  Follow-Up: At [facility], you and your health needs are our priority.  As part of our continuing mission to provide you with exceptional heart care, we have created designated Provider Care Teams.  These Care Teams include your primary Cardiologist (physician) and Advanced Practice Providers (APPs -  Physician Assistants and Nurse Practitioners) who all work together to provide you with the care you need, when you need it.  Your next appointment:   1 month(s)  The format for your next appointment:   Either In Person or Virtual  Provider:   You may see one of the following Advanced Practice Providers on your designated Care Team:    Rosaria Ferries, PA-C  Jory Sims, DNP, ANP

## 2019-01-04 LAB — CBC
Hematocrit: 34.1 % — ABNORMAL LOW (ref 37.5–51.0)
Hemoglobin: 11.4 g/dL — ABNORMAL LOW (ref 13.0–17.7)
MCH: 28.6 pg (ref 26.6–33.0)
MCHC: 33.4 g/dL (ref 31.5–35.7)
MCV: 86 fL (ref 79–97)
Platelets: 287 10*3/uL (ref 150–450)
RBC: 3.98 x10E6/uL — ABNORMAL LOW (ref 4.14–5.80)
RDW: 13.1 % (ref 11.6–15.4)
WBC: 8.9 10*3/uL (ref 3.4–10.8)

## 2019-01-04 LAB — BASIC METABOLIC PANEL
BUN/Creatinine Ratio: 26 — ABNORMAL HIGH (ref 10–24)
BUN: 36 mg/dL — ABNORMAL HIGH (ref 8–27)
CO2: 20 mmol/L (ref 20–29)
Calcium: 9 mg/dL (ref 8.6–10.2)
Chloride: 103 mmol/L (ref 96–106)
Creatinine, Ser: 1.37 mg/dL — ABNORMAL HIGH (ref 0.76–1.27)
GFR calc Af Amer: 63 mL/min/{1.73_m2} (ref >59)
GFR calc non Af Amer: 55 mL/min/{1.73_m2} — ABNORMAL LOW (ref >59)
Glucose: 112 mg/dL — ABNORMAL HIGH (ref 65–99)
Potassium: 5.1 mmol/L (ref 3.5–5.2)
Sodium: 141 mmol/L (ref 134–144)

## 2019-01-07 ENCOUNTER — Other Ambulatory Visit: Payer: Self-pay

## 2019-01-07 ENCOUNTER — Ambulatory Visit
Admission: RE | Admit: 2019-01-07 | Discharge: 2019-01-07 | Disposition: A | Discharge status: Home / Self Care | Payer: No Typology Code available for payment source | Source: Ambulatory Visit | Attending: Cardiothoracic Surgery | Admitting: Cardiothoracic Surgery

## 2019-01-07 ENCOUNTER — Ambulatory Visit (INDEPENDENT_AMBULATORY_CARE_PROVIDER_SITE_OTHER): Payer: Self-pay | Admitting: Cardiothoracic Surgery

## 2019-01-07 ENCOUNTER — Encounter: Payer: Self-pay | Admitting: Cardiothoracic Surgery

## 2019-01-07 ENCOUNTER — Encounter: Payer: Self-pay | Admitting: *Deleted

## 2019-01-07 VITALS — BP 95/53 | HR 85 | Temp 97.8°F | Resp 20 | Ht 75.0 in | Wt 213.0 lb

## 2019-01-07 DIAGNOSIS — Z951 Presence of aortocoronary bypass graft: Secondary | ICD-10-CM

## 2019-01-07 DIAGNOSIS — I2510 Atherosclerotic heart disease of native coronary artery without angina pectoris: Secondary | ICD-10-CM

## 2019-01-09 ENCOUNTER — Telehealth (HOSPITAL_COMMUNITY): Payer: Self-pay | Admitting: Cardiology

## 2019-01-09 NOTE — Telephone Encounter (Signed)
Pt aware of lab results ./cy 

## 2019-01-09 NOTE — Telephone Encounter (Signed)
COVID-19 pre-appointment screening questions: °  °Do you have a history of COVID-19 or a positive test result in the past 7-10 days? NO ° °To the best of your knowledge, have you been in close contact with anyone with a confirmed diagnosis of COVID 19? NO ° °Have you had any one or more of the following: Fever, chills, cough, shortness of breath (out of the normal for you) or any flu-like symptoms? NO ° °Are you experiencing any of the following symptoms that is new or out of usual for you: NO ° °• Ear, nose or throat discomfort °• Sore throat °• Headache °• Muscle Pain °• Diarrhea °• Loss of taste or smell ° ° °Reviewed all the following with patient: °• Use of hand sanitizer when entering the building °• Everyone is required to wear a mask in the building, if you do not have a mask we are happy to provide you with one when you arrive °• NO Visitor guidelines ° ° °If patient answers YES to any of questions they must change to a virtual visit and place note in comments about symptoms ° °

## 2019-01-10 ENCOUNTER — Other Ambulatory Visit: Payer: Self-pay

## 2019-01-10 ENCOUNTER — Ambulatory Visit (HOSPITAL_COMMUNITY)
Admission: RE | Admit: 2019-01-10 | Discharge: 2019-01-10 | Disposition: A | Discharge status: Home / Self Care | Payer: No Typology Code available for payment source | Source: Ambulatory Visit | Attending: Adult Health | Admitting: Adult Health

## 2019-01-10 ENCOUNTER — Encounter (HOSPITAL_COMMUNITY): Payer: Self-pay

## 2019-01-10 ENCOUNTER — Telehealth (HOSPITAL_COMMUNITY): Payer: Self-pay | Admitting: Cardiology

## 2019-01-10 VITALS — Wt 213.0 lb

## 2019-01-10 DIAGNOSIS — I2510 Atherosclerotic heart disease of native coronary artery without angina pectoris: Secondary | ICD-10-CM

## 2019-01-10 DIAGNOSIS — Z833 Family history of diabetes mellitus: Secondary | ICD-10-CM

## 2019-01-10 DIAGNOSIS — D500 Iron deficiency anemia secondary to blood loss (chronic): Secondary | ICD-10-CM

## 2019-01-10 DIAGNOSIS — I110 Hypertensive heart disease with heart failure: Secondary | ICD-10-CM

## 2019-01-10 DIAGNOSIS — Z951 Presence of aortocoronary bypass graft: Secondary | ICD-10-CM

## 2019-01-10 DIAGNOSIS — Z7189 Other specified counseling: Secondary | ICD-10-CM

## 2019-01-10 DIAGNOSIS — I5022 Chronic systolic (congestive) heart failure: Secondary | ICD-10-CM

## 2019-01-10 DIAGNOSIS — I255 Ischemic cardiomyopathy: Secondary | ICD-10-CM

## 2019-01-10 DIAGNOSIS — Z7982 Long term (current) use of aspirin: Secondary | ICD-10-CM

## 2019-01-10 DIAGNOSIS — Z8249 Family history of ischemic heart disease and other diseases of the circulatory system: Secondary | ICD-10-CM

## 2019-01-10 DIAGNOSIS — Z7902 Long term (current) use of antithrombotics/antiplatelets: Secondary | ICD-10-CM

## 2019-01-10 NOTE — Patient Instructions (Signed)
CHANGE Lasix and Potassium to AS NEEDED for weight greater than 213 lb  Your physician recommends that you schedule a follow-up appointment in: 8 weeks with Dr Aundra Dubin  Do the following things EVERYDAY: 1) Weigh yourself in the morning before breakfast. Write it down and keep it in a log. 2) Take your medicines as prescribed 3) Eat low salt foods--Limit salt (sodium) to 2000 mg per day.  4) Stay as active as you can everyday 5) Limit all fluids for the day to less than 2 liters  At the [facility], you and your health needs are our priority. As part of our continuing mission to provide you with exceptional heart care, we have created designated Provider Care Teams. These Care Teams include your primary Cardiologist (physician) and Advanced Practice Providers (APPs- Physician Assistants and Nurse Practitioners) who all work together to provide you with the care you need, when you need it.   You may see any of the following providers on your designated Care Team at your next follow up: Marland Kitchen Dr Glori Bickers . Dr [name] . Darrick Grinder, NP . Lyda Jester, PA . Audry Riles, PharmD   Please be sure to bring in all your medications bottles to every appointment.

## 2019-01-10 NOTE — Telephone Encounter (Addendum)
Verbally reviewed updates/changes AVS printed and mailed to patient  ----- Message from Conrad Bloom[place], NP sent at 01/10/2019 10:27 AM EST ----- Regarding: AVS . Change lasix and potassium to as needed for weight 213 or greater on his home scale.  Recommended follow-up: 8 weeks with Dr Aundra Dubin

## 2019-01-10 NOTE — Addendum Note (Signed)
Encounter addended by: Kerry Dory, CMA on: 01/10/2019 10:45 AM  Actions taken: Clinical Note Signed

## 2019-01-10 NOTE — Progress Notes (Signed)
Heart Failure TeleHealth Note  Due to national recommendations of social distancing due to [place] 19, Audio/video telehealth visit is felt to be most appropriate for this patient at this time.  See MyChart message from today for patient consent regarding telehealth for [facility].  Date:  01/10/2019   ID:  Frank Love, DOB 31-May-1955, MRN [id number]  Location: Home  Provider location: [place] [facility] Failure Type of Visit: Established patient   PCP:  Courtney Heys, MD  Cardiologist:  [name], MD Primary HF: Dr Aundra Dubin   Chief Complaint: Heart Failure    History of Present Illness: Frank Love is a 63 y.o. male with a history of HTN, LE edema, and elevated BNP who presented to [facility] 11/18 for CABGx4.  He initially was seen by PCP 5/22 with elevated BNP and was started on lasix with cardiology referral, lasix was subsequently increased to 40 mg bid due to ongoing LE edema. He underwent cardiac evaluation for right hip surgery in September with continued symptoms of LE edema on lasix 40 bid. Echo showed systolic HF with EF 99991111. He was started on entresto, metoprolol at this time.   He underwent cath 11/04 which showed multivessel disease, now s/p CABGx4 11/18. He was returned to OR for reexploration due to increased sanguinous o/p in his chest tube with resolution of pinpoint bleeding with suture. Repeat echo done 11/23 showed severely decreased right ventricular systolic function with severe enlargement in addition to small pericardial effusion, otherwise similar to previous. VQ scan was negative for PE. Transferred to CIR for rehab.   He presents via Psychiatric nurse for a telehealth visit today for HF follow up. Earlier this week he was instructed to cut back carvedilol 3.125 twice a day and to change lasix/potassium to as needed. Overall feeling fine. Denies SOB/PND/Orthopnea. Mild SOB after walking 90 feet. No bleeding issues.  Appetite ok. No fever  or chills. Weight at home 208 pounds. Taking all medications. Followed by [facility].     he denies symptoms worrisome for COVID 19.   Past Medical History:  Diagnosis Date  . Chronic pain of right knee   . Coronary artery disease   . Hypertension   . Phimosis    Past Surgical History:  Procedure Laterality Date  . CIRCUMCISION N/A 10/12/2018   Procedure: CIRCUMCISION ADULT;  Surgeon: Franchot Gallo, MD;  Location: [facility];  Service: Urology;  Laterality: N/A;  45 mins  . COLONOSCOPY    . CORONARY ARTERY BYPASS GRAFT N/A 12/12/2018   Procedure: CORONARY ARTERY BYPASS GRAFTING (CABG) x four, using bilateral internal mammary arteries and right leg greater saphenous vein harvested endoscopically;  Surgeon: [name], MD;  Location: [place];  Service: Open Heart Surgery;  Laterality: N/A;  . LEFT HEART CATH AND CORONARY ANGIOGRAPHY N/A 11/28/2018   Procedure: LEFT HEART CATH AND CORONARY ANGIOGRAPHY;  Surgeon: Burnell Blanks, MD;  Location: [place] [facility];  Service: Cardiovascular;  Laterality: N/A;  . MEDIASTINAL EXPLORATION N/A 12/12/2018   Procedure: Mediastinal Re-Exploration after bypass graft;  Surgeon: [name], MD;  Location: [place];  Service: Open Heart Surgery;  Laterality: N/A;  . TEE WITHOUT CARDIOVERSION N/A 12/12/2018   Procedure: TRANSESOPHAGEAL ECHOCARDIOGRAM (TEE);  Surgeon: [name], MD;  Location: [place];  Service: Open Heart Surgery;  Laterality: N/A;  . TOOTH EXTRACTION       Current Outpatient Medications  Medication Sig Dispense Refill  . aspirin EC 81 MG  tablet Take 81 mg by mouth at bedtime.     Marland Kitchen atorvastatin (LIPITOR) 40 MG tablet Take 1 tablet (40 mg total) by mouth daily at 6 PM. 30 tablet 1  . carvedilol (COREG) 3.125 MG tablet Take 3.125 mg by mouth 2 (two) times daily with a meal.    . clopidogrel (PLAVIX) 75 MG tablet Take 1 tablet (75 mg total) by mouth daily. 30 tablet 1  . digoxin (LANOXIN) 0.125 MG tablet Take 1 tablet  (0.125 mg total) by mouth daily. 30 tablet 1  . ferrous Q000111Q C-folic acid (TRINSICON / FOLTRIN) capsule Take 1 capsule by mouth 2 (two) times daily after a meal. 60 capsule 0  . losartan (COZAAR) 25 MG tablet Take 0.5 tablets (12.5 mg total) by mouth at bedtime. 30 tablet 1  . Multiple Vitamins-Iron (MULTIVITAMINS WITH IRON) TABS tablet Take 1 tablet by mouth daily. 30 tablet 1  . pantoprazole (PROTONIX) 20 MG tablet Take 1 tablet (20 mg total) by mouth daily. 30 tablet 1  . promethazine (PHENERGAN) 12.5 MG tablet Take 0.5 tablets (6.25 mg total) by mouth every 8 (eight) hours as needed for nausea or vomiting. 30 tablet 0  . senna-docusate (SENOKOT-S) 8.6-50 MG tablet Take 2 tablets by mouth at bedtime as needed for mild constipation. 60 tablet 0  . spironolactone (ALDACTONE) 25 MG tablet Take 1 tablet (25 mg total) by mouth daily. 30 tablet 3  . traZODone (DESYREL) 50 MG tablet Take 0.5 tablets (25 mg total) by mouth at bedtime as needed for sleep. 30 tablet 0  . furosemide (LASIX) 40 MG tablet Take 1 tablet (40 mg total) by mouth daily. . (Patient not taking: Reported on 01/10/2019) 30 tablet 1  . nitroGLYCERIN (NITROSTAT) 0.4 MG SL tablet Place 1 tablet (0.4 mg total) under the tongue every 5 (five) minutes as needed for chest pain. NO more than 3 pills--if you have the need to take 2 pills or more call MD (Patient not taking: Reported on 01/10/2019) 30 tablet 0  . potassium chloride SA (KLOR-CON) 20 MEQ tablet Take 1 tablet (20 mEq total) by mouth daily. (Patient not taking: Reported on 01/10/2019) 30 tablet 1   No current facility-administered medications for this encounter.    Allergies:   Patient has no known allergies.   Social History:  The patient  reports that he has never smoked. He has never used smokeless tobacco. He reports that he does not drink alcohol or use drugs.   Family History:  The patient's family history includes Diabetes in his mother; Heart disease in  his mother.   ROS:  Please see the history of present illness.   All other systems are personally reviewed and negative.   Exam: Tele Health Call; Exam is subjective General:  Speaks in full sentences. No resp difficulty. Lungs: Normal respiratory effort with conversation.  Abdomen: Non-distended per patient report Extremities: Pt denies edema. Neuro: Alert & oriented x 3.   Recent Labs: 08/03/2018: BNP 316.2 12/13/2018: Magnesium 2.3 12/22/2018: ALT 22 01/03/2019: BUN 36; Creatinine, Ser 1.37; Hemoglobin 11.4; Platelets 287; Potassium 5.1; Sodium 141  Personally reviewed   Wt Readings from Last 3 Encounters:  01/10/19 96.6 kg (213 lb)  01/07/19 96.6 kg (213 lb)  01/03/19 97.3 kg (214 lb 6.4 oz)      ASSESSMENT AND PLAN:   1. CAD: S/p CABG - No chest pain. .  - Continue ASA 81 + Plavix.  - Continue statin.   2. Chronic systolic CHF: Ischemic  cardiomyopathy. Post-op echo was reviewed, showed EF 30-35%, moderate-severe RV dysfunction. RV function worse post-op, V/Q scan not suggestive of PE. - NYHA II-III.  Volume status stable. Change lasix to as needed 40 mg + 20 meq potassium if weight 213 pounds or greater at home. I reviewed BMET from 01/03/19. Lasix was stopped.    - Continue 12.5 mg losartan daily at bed time.  - Continue spironolactone 12.5 daily.  - Continue digoxin 0.125 daily.  - Continue Coreg 3.125 mg bid.  - Plan to repeat ECHO in 3 months after HF medications optimized.   3. Anemia: Post-op blood loss.  Resolved Hgb up to 11/4 on 01/03/19   COVID screen The patient does not have any symptoms that suggest any further testing/ screening at this time.  Social distancing reinforced today.  Patient Risk: After full review of this patients clinical status, I feel that they are at moderate risk for cardiac decompensation at this time.  Relevant cardiac medications were reviewed at length with the patient today. The patient does not have concerns regarding  their medications at this time.   The following changes were made today:  Update medications list. Change lasix and potassium to as needed for weight 213 or greater on his home scale.  Recommended follow-up: 8 weeks with Dr Aundra Dubin   Today, I have spent 15 minutes with the patient with telehealth technology discussing the above issues .    Jeanmarie Hubert, NP  01/10/2019 10:19 AM  [place] [address] [phone] (office) [phone] (fax)

## 2019-01-11 NOTE — Progress Notes (Signed)
[address]       [place] [address]             [phone]     CARDIOTHORACIC SURGERY OFFICE NOTE  Referring Provider is [name], MD Primary Cardiologist is [name], MD PCP is Lovorn, Jinny Blossom, MD   HPI:  63 yo man presents for elective f/u after recent hospitalization for CABG followed by inpatient rehab stint due to chronic right hip pain associated with arthritis. He has done very well. Denies chest pain, but he does continue to have occasional nausea.    Current Outpatient Medications  Medication Sig Dispense Refill  . aspirin EC 81 MG tablet Take 81 mg by mouth at bedtime.     Marland Kitchen atorvastatin (LIPITOR) 40 MG tablet Take 1 tablet (40 mg total) by mouth daily at 6 PM. 30 tablet 1  . clopidogrel (PLAVIX) 75 MG tablet Take 1 tablet (75 mg total) by mouth daily. 30 tablet 1  . digoxin (LANOXIN) 0.125 MG tablet Take 1 tablet (0.125 mg total) by mouth daily. 30 tablet 1  . ferrous Q000111Q C-folic acid (TRINSICON / FOLTRIN) capsule Take 1 capsule by mouth 2 (two) times daily after a meal. 60 capsule 0  . furosemide (LASIX) 40 MG tablet Take 1 tablet (40 mg total) by mouth daily. . (Patient not taking: Reported on 01/10/2019) 30 tablet 1  . losartan (COZAAR) 25 MG tablet Take 0.5 tablets (12.5 mg total) by mouth at bedtime. 30 tablet 1  . Multiple Vitamins-Iron (MULTIVITAMINS WITH IRON) TABS tablet Take 1 tablet by mouth daily. 30 tablet 1  . nitroGLYCERIN (NITROSTAT) 0.4 MG SL tablet Place 1 tablet (0.4 mg total) under the tongue every 5 (five) minutes as needed for chest pain. NO more than 3 pills--if you have the need to take 2 pills or more call MD (Patient not taking: Reported on 01/10/2019) 30 tablet 0  . pantoprazole (PROTONIX) 20 MG tablet Take 1 tablet (20 mg total) by mouth daily. 30 tablet 1  . potassium chloride SA (KLOR-CON) 20 MEQ tablet Take 1 tablet (20 mEq total) by mouth daily. (Patient not taking: Reported on 01/10/2019)  30 tablet 1  . promethazine (PHENERGAN) 12.5 MG tablet Take 0.5 tablets (6.25 mg total) by mouth every 8 (eight) hours as needed for nausea or vomiting. 30 tablet 0  . senna-docusate (SENOKOT-S) 8.6-50 MG tablet Take 2 tablets by mouth at bedtime as needed for mild constipation. 60 tablet 0  . spironolactone (ALDACTONE) 25 MG tablet Take 1 tablet (25 mg total) by mouth daily. 30 tablet 3  . traZODone (DESYREL) 50 MG tablet Take 0.5 tablets (25 mg total) by mouth at bedtime as needed for sleep. 30 tablet 0  . carvedilol (COREG) 3.125 MG tablet Take 3.125 mg by mouth 2 (two) times daily with a meal.     No current facility-administered medications for this visit.      Physical Exam:   BP (!) 95/53   Pulse 85   Temp 97.8 F (36.6 C) (Skin)   Resp 20   Ht 6\' 3"  (1.905 m)   Wt 96.6 kg   SpO2 96% Comment: RA  BMI 26.62 kg/m   General:  Well-appearing, NAD  Chest:   CTA  CV:   rrr  Incisions:  Well-healed  Abdomen:  Sntnd, no masses  Extremities:  No edema  Diagnostic Tests:  CXR with clear lung fields   Impression:  Doing very well after CABG  for low EF/heart failure  Plan:  F/u with thoracic surgery as needed He is safe to undergo orthopedic surgery at any point that he feels up to it.   I spent in excess of 20 minutes during the conduct of this office consultation and >50% of this time involved direct face-to-face encounter with the patient for counseling and/or coordination of their care.  Level 2                 10 minutes Level 3                 15 minutes Level 4                 25 minutes Level 5                 40 minutes  B. Murvin Natal, MD 01/11/2019 6:16 AM

## 2019-01-14 ENCOUNTER — Encounter: Payer: Self-pay | Admitting: Physical Medicine and Rehabilitation

## 2019-01-14 ENCOUNTER — Telehealth: Payer: Self-pay | Admitting: *Deleted

## 2019-01-14 ENCOUNTER — Encounter
Payer: No Typology Code available for payment source | Attending: Physical Medicine and Rehabilitation | Admitting: Physical Medicine and Rehabilitation

## 2019-01-14 ENCOUNTER — Other Ambulatory Visit: Payer: Self-pay

## 2019-01-14 VITALS — Ht 75.0 in | Wt 212.0 lb

## 2019-01-14 DIAGNOSIS — R5381 Other malaise: Secondary | ICD-10-CM

## 2019-01-14 DIAGNOSIS — Z951 Presence of aortocoronary bypass graft: Secondary | ICD-10-CM

## 2019-01-14 DIAGNOSIS — Z09 Encounter for follow-up examination after completed treatment for conditions other than malignant neoplasm: Secondary | ICD-10-CM

## 2019-01-14 DIAGNOSIS — I5023 Acute on chronic systolic (congestive) heart failure: Secondary | ICD-10-CM

## 2019-01-14 DIAGNOSIS — D62 Acute posthemorrhagic anemia: Secondary | ICD-10-CM

## 2019-01-14 NOTE — Telephone Encounter (Signed)
   [facility] Pre-operative Risk Assessment    Request for surgical clearance:  1. What type of surgery is being performed? TOTAL HIP ARTHROPLASTY   2. When is this surgery scheduled? TBD   3. What type of clearance is required (medical clearance vs. Pharmacy clearance to hold med vs. Both)? MEDICAL  4. Are there any medications that need to be held prior to surgery and how long? ASA AND PLAVIX   5. Practice name and name of physician performing surgery? [facility]; DR. Jean Rosenthal   6. What is your office phone number [phone]    7.   What is your office fax number [phone] ATTN: SHERRIE  8.   Anesthesia type (None, local, MAC, general) ? SPINAL vs GENERAL    Frank Love 01/14/2019, 3:20 PM  _________________________________________________________________   (provider comments below)

## 2019-01-14 NOTE — Patient Instructions (Signed)
Patient is a 63 yr old male s/p CABG x4- done 12/11/2018 1. Referral for PT- 2-3x/week-  2.  Pt needs to be doing small tasks to care for himself as well as to help around the home- doing dishes, making breakfast/lunch, but don't break sternal precautions.  3. Will see pt back in 4 weeks to see that he's progressing to my satisfaction as well as wife's.  4. Will have staff call WIFE to make 4 week f/u appointment

## 2019-01-14 NOTE — Progress Notes (Signed)
Subjective:    Patient ID: Frank Love, male    DOB: 23-Jun-1955, 63 y.o.   MRN: [id number]  HPI   Things going well-   Cardiologist /Cardiothoracic surgery clears patient for surgery.  No issues currently. Not driving yet because hasn't been cleared.  Doing H/H 1x/week- PT- and has 1 more scheduled visit.  Walking well with them- got some HEP from them. Difficult to walk sometimes- uses RW and cane.  Walking mainly around the house and then 2-3x outside the house- 200 ft to car and down/up stairs- 6-7 stairs, then 3-4 more stairs to do and navigating well.  Not having any pain.  Saw Cardiologist - had some blood work- Told to stop Lasix so that probably can explain why Cr was up slightly.  No swelling in legs.  Doesn't get around all that well per wife-  PT told him to go up every 1-2 hours- wife thinks should be every 1 hour-  Wife said pt not getting up as much- spends most of time on couch- not fixing own meals or doing anything for himself. Wife is concerned about that.  Pain Inventory Average Pain 0 Pain Right Now 0 My pain is no pain  In the last 24 hours, has pain interfered with the following? General activity 0 Relation with others 0 Enjoyment of life 0 What TIME of day is your pain at its worst? no pain Sleep (in general) Good  Pain is worse with: no pain Pain improves with: no pain Relief from Meds: no pain  Mobility walk with assistance use a cane use a walker ability to climb steps?  yes do you drive?  no  Function retired  Neuro/Psych trouble walking  Prior Studies transitional care  Physicians involved in your care transitional care   Family History  Problem Relation Age of Onset  . Diabetes Mother   . Heart disease Mother    Social History   Socioeconomic History  . Marital status: Married    Spouse name: Not on file  . Number of children: Not on file  . Years of education: Not on file  . Highest education level:  Not on file  Occupational History  . Not on file  Tobacco Use  . Smoking status: Never Smoker  . Smokeless tobacco: Never Used  Substance and Sexual Activity  . Alcohol use: No    Alcohol/week: 0.0 standard drinks  . Drug use: No  . Sexual activity: Yes  Other Topics Concern  . Not on file  Social History Narrative   Lives with wife.     Social Determinants of Health   Financial Resource Strain:   . Difficulty of Paying Living Expenses: Not on file  Food Insecurity:   . Worried About Charity fundraiser in the Last Year: Not on file  . Ran Out of Food in the Last Year: Not on file  Transportation Needs:   . Lack of Transportation (Medical): Not on file  . Lack of Transportation (Non-Medical): Not on file  Physical Activity:   . Days of Exercise per Week: Not on file  . Minutes of Exercise per Session: Not on file  Stress:   . Feeling of Stress : Not on file  Social Connections:   . Frequency of Communication with Friends and Family: Not on file  . Frequency of Social Gatherings with Friends and Family: Not on file  . Attends Religious Services: Not on file  . Active Member of Clubs  or Organizations: Not on file  . Attends Archivist Meetings: Not on file  . Marital Status: Not on file   Past Surgical History:  Procedure Laterality Date  . CIRCUMCISION N/A 10/12/2018   Procedure: CIRCUMCISION ADULT;  Surgeon: Franchot Gallo, MD;  Location: [facility];  Service: Urology;  Laterality: N/A;  45 mins  . COLONOSCOPY    . CORONARY ARTERY BYPASS GRAFT N/A 12/12/2018   Procedure: CORONARY ARTERY BYPASS GRAFTING (CABG) x four, using bilateral internal mammary arteries and right leg greater saphenous vein harvested endoscopically;  Surgeon: [name], MD;  Location: [place];  Service: Open Heart Surgery;  Laterality: N/A;  . LEFT HEART CATH AND CORONARY ANGIOGRAPHY N/A 11/28/2018   Procedure: LEFT HEART CATH AND CORONARY ANGIOGRAPHY;  Surgeon: Burnell Blanks, MD;  Location: [place] [facility];  Service: Cardiovascular;  Laterality: N/A;  . MEDIASTINAL EXPLORATION N/A 12/12/2018   Procedure: Mediastinal Re-Exploration after bypass graft;  Surgeon: [name], MD;  Location: [place];  Service: Open Heart Surgery;  Laterality: N/A;  . TEE WITHOUT CARDIOVERSION N/A 12/12/2018   Procedure: TRANSESOPHAGEAL ECHOCARDIOGRAM (TEE);  Surgeon: [name], MD;  Location: [place];  Service: Open Heart Surgery;  Laterality: N/A;  . TOOTH EXTRACTION     Past Medical History:  Diagnosis Date  . Chronic pain of right knee   . Coronary artery disease   . Hypertension   . Phimosis    There were no vitals taken for this visit.  Opioid Risk Score:   Fall Risk Score:  `1  Depression screen PHQ 2/9  Depression screen [facility] 2/9 07/06/2018 06/12/2018 04/17/2015  Decreased Interest 0 0 0  Down, Depressed, Hopeless 0 0 0  PHQ - 2 Score 0 0 0  Altered sleeping 0 1 -  Tired, decreased energy 0 0 -  Change in appetite 0 0 -  Feeling bad or failure about yourself  0 0 -  Trouble concentrating 0 0 -  Moving slowly or fidgety/restless 0 0 -  Suicidal thoughts 0 0 -  PHQ-9 Score 0 1 -    Review of Systems  Constitutional: Negative.   HENT: Negative.   Eyes: Negative.   Respiratory: Negative.   Cardiovascular: Negative.   Gastrointestinal: Positive for nausea.  Endocrine: Negative.   Genitourinary: Negative.   Musculoskeletal: Positive for gait problem.  Skin: Negative.   Allergic/Immunologic: Negative.   Hematological: Bruises/bleeds easily.  Psychiatric/Behavioral: Negative.        Objective:   Physical Exam  Awake, alert, appropriate, wife on his side, NAD Saw on telehealth Chest was healed- crusty stuff around it.A little scabby     Assessment & Plan:   Patient is a 63 yr old male s/p CABG x4- done 12/11/2018 1. Referral for PT- 2-3x/week-  2.  Pt needs to be doing small tasks to care for himself as well as to help around the  home- doing dishes, making breakfast/lunch, but don't break sternal precautions.  3. Will see pt back in 4 weeks to see that he's progressing to my satisfaction as well as wife's.  4. Will have staff call WIFE to make 4 week f/u appointment

## 2019-01-15 NOTE — Telephone Encounter (Signed)
Appt note reflect surgery clearance is needed. I will forward clearance note to Dr. Percival Spanish for appt. I will send note to surgeon Dr. Ninfa Linden as Juluis Rainier. I will remove from the per op call back pool.

## 2019-01-15 NOTE — Telephone Encounter (Signed)
Pt has hx of  CABG in 11/2016 with subsequent reduced LV and RV function. Currenlty being followed by Dr. Percival Spanish and AHF for optimization of HF meds. He has a follow up with Dr. Percival Spanish on 1/12 and pre-op clearance can be addressed at that appt.  Preop call back, Please add pre-op clearance to his appt note.  I will forward this preop request to Dr. Percival Spanish.

## 2019-01-23 MED FILL — ?CARVEDILOL 3.125 MG TABLET: 3.125 | 30 days supply | Qty: 60 | Fill #1

## 2019-01-23 MED FILL — ?DIGITEK 125 MCG TABLET: 125 | 30 days supply | Qty: 30 | Fill #1

## 2019-01-23 MED FILL — ?SPIRONOLACTONE 25 MG TABLE: 25 | 30 days supply | Qty: 30 | Fill #1

## 2019-01-23 MED FILL — ?ATORVASTATIN 40MG TABLET: 40 | 30 days supply | Qty: 30 | Fill #1

## 2019-01-23 MED FILL — PANTOPRAZOLE SOD DR 20 MG T: 20 | 30 days supply | Qty: 30 | Fill #1

## 2019-01-28 ENCOUNTER — Ambulatory Visit: Payer: Self-pay | Attending: Physical Medicine and Rehabilitation | Admitting: Physical Therapy

## 2019-01-28 ENCOUNTER — Telehealth (HOSPITAL_COMMUNITY): Payer: Self-pay

## 2019-01-28 ENCOUNTER — Other Ambulatory Visit: Payer: Self-pay

## 2019-01-28 DIAGNOSIS — M6281 Muscle weakness (generalized): Secondary | ICD-10-CM | POA: Insufficient documentation

## 2019-01-28 DIAGNOSIS — M25551 Pain in right hip: Secondary | ICD-10-CM | POA: Insufficient documentation

## 2019-01-28 DIAGNOSIS — R2689 Other abnormalities of gait and mobility: Secondary | ICD-10-CM | POA: Insufficient documentation

## 2019-01-28 NOTE — Patient Instructions (Signed)
Access Code: [email or web address]  URL: [email or web address]  Date: 01/28/2019  Prepared by: Hilda Blades   Exercises Supine March - 10 reps - 2-3x daily - 7x weekly Bent Knee Fallouts - 20 reps - 2-3x daily - 7x weekly Hooklying Clamshell with Resistance - 20 reps - 2-3x daily - 7x weekly Supine Bridge - 10 reps - 2-3x daily - 7x weekly Seated Hamstring Stretch - 3 reps - 20-30 seconds hold - 2-3x daily - 7x weekly

## 2019-01-28 NOTE — Telephone Encounter (Signed)
Have attempted to contact PT several times to schedule follow-up appt.the patient has voicemail greeting that states that he is having heart surgery and a hip replacement surgery.  Have left messages awaiting return call.

## 2019-01-28 NOTE — Therapy (Signed)
[place], [place], [address] Phone: [phone]   Fax:  [phone]  Physical Therapy Evaluation  Patient Details  Name: Frank Love MRN: [id number] Date of Birth: [birth year] Referring Provider (PT): Courtney Heys, MD   Encounter Date: 01/28/2019  PT End of Session - 01/28/19 1345    Visit Number  1    Number of Visits  8    Date for PT Re-Evaluation  03/25/19    Authorization Type  [employer] DISCOUNT    PT Start Time  1001    PT Stop Time  1045    PT Time Calculation (min)  44 min    Activity Tolerance  Patient tolerated treatment well    Behavior During Therapy  [facility] for tasks assessed/performed       Past Medical History:  Diagnosis Date  . Chronic pain of right knee   . Coronary artery disease   . Hypertension   . Phimosis     Past Surgical History:  Procedure Laterality Date  . CIRCUMCISION N/A 10/12/2018   Procedure: CIRCUMCISION ADULT;  Surgeon: Franchot Gallo, MD;  Location: [facility];  Service: Urology;  Laterality: N/A;  45 mins  . COLONOSCOPY    . CORONARY ARTERY BYPASS GRAFT N/A 12/12/2018   Procedure: CORONARY ARTERY BYPASS GRAFTING (CABG) x four, using bilateral internal mammary arteries and right leg greater saphenous vein harvested endoscopically;  Surgeon: [name], MD;  Location: [place];  Service: Open Heart Surgery;  Laterality: N/A;  . LEFT HEART CATH AND CORONARY ANGIOGRAPHY N/A 11/28/2018   Procedure: LEFT HEART CATH AND CORONARY ANGIOGRAPHY;  Surgeon: Burnell Blanks, MD;  Location: [place] [facility];  Service: Cardiovascular;  Laterality: N/A;  . MEDIASTINAL EXPLORATION N/A 12/12/2018   Procedure: Mediastinal Re-Exploration after bypass graft;  Surgeon: [name], MD;  Location: [place];  Service: Open Heart Surgery;  Laterality: N/A;  . TEE WITHOUT CARDIOVERSION N/A 12/12/2018   Procedure: TRANSESOPHAGEAL ECHOCARDIOGRAM (TEE);  Surgeon: [name], MD;  Location: [place];  Service: Open Heart Surgery;  Laterality: N/A;  . TOOTH EXTRACTION      There were no vitals filed for this visit.   Subjective Assessment - 01/28/19 1007    Subjective  Patient reports CABG surgery on 11/17 and went to CIR for a few days. Since he was discharged from the hospital his main issue has been right hip pain which has impaired his ability to move around. He has had chronic right hip pain is in planning to have a right THA in the near future once he gets clearance. He is still doing exercises he was given from the hospital and with HHPT. He notes that after the last 4-5 days he feels his hip has changed now and now rather than trailing behind his left leg wants to be ahead of him. He is using a cane where he holds on to it with both hands in front of him for improved stability. He is still abiding by his sternal precautions.    Pertinent History  CABG x4 on 12/11/2018 sternal precautions    Limitations  Standing;Walking;Sitting;House hold activities;Lifting    How long can you sit comfortably?  No limitation    How long can you stand comfortably?  5 or less minutes    How long can you walk comfortably?  1000 ft    Diagnostic tests  X-ray    Patient Stated Goals  Return to sleeping  in bed, improve walking    Currently in Pain?  Yes    Pain Score  0-No pain   8/10 with weight bearing   Pain Location  Hip    Pain Orientation  Right    Pain Descriptors / Indicators  Aching;Sharp    Pain Type  Chronic pain    Pain Onset  More than a month ago    Pain Frequency  Intermittent    Aggravating Factors   Weight bearing activity    Pain Relieving Factors  Rest    Effect of Pain on Daily Activities  Patient is not able to work, walking and standing is limited         [facility] PT Assessment - 01/28/19 0001      Assessment   Medical Diagnosis  s/p CABG, chronic right hip pain    Referring Provider (PT)  Courtney Heys, MD    Onset Date/Surgical Date  --    12/12/2018   Hand Dominance  Right    Next MD Visit  02/05/2019    Prior Therapy  Yes - CIR and HHPT      Precautions   Precautions  Sternal      Restrictions   Weight Bearing Restrictions  No      Balance Screen   Has the patient fallen in the past 6 months  No    Has the patient had a decrease in activity level because of a fear of falling?   No    Is the patient reluctant to leave their home because of a fear of falling?   No      Home Film/video editor residence    Living Arrangements  Spouse/significant other      Prior Function   Level of Independence  Independent with basic ADLs    Vocation  Full time employment    Vocation Requirements  Patient works in [employer] loss [employer], requires walkin, carry ~20 lb box      Cognition   Overall Cognitive Status  Within Functional Limits for tasks assessed      Observation/Other Assessments   Observations  Patient appears in no apparent distress    Focus on Therapeutic Outcomes (FOTO)   NA      Sensation   Light Touch  Appears Intact      ROM / Strength   AROM / PROM / Strength  AROM;PROM;Strength      PROM   Overall PROM Comments  Patient reports increased pain with all hip motion    PROM Assessment Site  Hip    Right/Left Hip  Right    Right Hip Extension  -15   lacking from neutral   Right Hip Flexion  100    Right Hip External Rotation   40    Right Hip Internal Rotation   0    Right Hip ABduction  35    Right Hip ADduction  0      Strength   Strength Assessment Site  Hip;Knee    Right/Left Hip  Right;Left    Right Hip Flexion  2/5    Right Hip Extension  3-/5    Right Hip ABduction  3+/5    Left Hip Flexion  4/5    Left Hip Extension  4-/5    Left Hip ABduction  3+/5    Right/Left Knee  Right;Left    Right Knee Flexion  4/5    Right Knee Extension  4+/5  Left Knee Flexion  4/5    Left Knee Extension  4+/5      Flexibility   Soft Tissue Assessment /Muscle Length  yes    Hamstrings  Right  side limited      Palpation   Palpation comment  TTP over right lateral hip, greater trochanter region      Special Tests    Special Tests  Hip Special Tests    Hip Special Tests   Hip Scouring      Hip Scouring   Findings  Positive      Transfers   Transfers  Independent with all Transfers   with use of SPC      Ambulation/Gait   Ambulation/Gait  Yes    Ambulation/Gait Assistance  6: Modified independent (Device/Increase time)    Assistive device  Straight cane    Gait Comments  Patient ambulates with cane held with both hands in front of him, severely antalgic gait on right, step-to pattern on right, right hip is abducted and knee remains flexed throughout gait cycle,                  Objective measurements completed on examination: See above findings.      [facility] PT Treatment/Exercise - 01/28/19 0001      Exercises   Exercises  Knee/Hip      Knee/Hip Exercises: Stretches   Active Hamstring Stretch  3 reps;30 seconds    Active Hamstring Stretch Limitations  seated edge of mat      Knee/Hip Exercises: Supine   Heel Slides  10 reps    Heel Slides Limitations  alternating marching, VC for technique    Bridges  10 reps    Bridges Limitations  VC for technique    Other Supine Knee/Hip Exercises  Bent knee fall out x20    Other Supine Knee/Hip Exercises  Clamshell with red band x20             PT Education - 01/28/19 1033    Education Details  Exam findings, POC, HEP, walking, getting up every hour ~ 10 minutes    Person(s) Educated  Patient    Methods  Explanation;Demonstration;Verbal cues;Handout;Tactile cues    Comprehension  Verbalized understanding;Returned demonstration;Verbal cues required;Tactile cues required;Need further instruction       PT Short Term Goals - 01/28/19 1621      PT SHORT TERM GOAL #1   Title  Patient will be independent in HEP to make progress in PT.    Time  4    Period  Weeks    Status  New    Target Date   02/25/19      PT SHORT TERM GOAL #2   Title  Patient will be able to stand from standard height chair without use of UEs for support.    Time  4    Period  Weeks    Status  New    Target Date  02/25/19      PT SHORT TERM GOAL #3   Title  Patient will report ability to stand >/=10 minutes so he can perform tasks to improve heart health.    Time  4    Period  Weeks    Status  New    Target Date  02/25/19        PT Long Term Goals - 01/28/19 1624      PT LONG TERM GOAL #1   Title  Patient will report right hip  pain </= 5/10 with weight bearing tasks to allow for improved mobility.    Time  8    Period  Weeks    Status  New    Target Date  03/25/19      PT LONG TERM GOAL #2   Title  Patient will exhibit improved strength of right hip to grossly >/= 4/5 MMT to allow for improved walking and transfer ability.    Time  8    Period  Weeks    Status  New    Target Date  03/25/19      PT LONG TERM GOAL #3   Title  Patient will exhibit improved hip motion to neutral hip extension and adduction of >/= 10 deg to allow for improved walking ability.    Time  8    Period  Weeks    Status  New    Target Date  03/25/19      PT LONG TERM GOAL #4   Title  Patient will be able to stand >/= 15 minutes with min-mod pain level to allow him to perform tasks around the home and meal prep.    Time  8    Period  Weeks    Status  New    Target Date  03/25/19             Plan - 01/28/19 1348    Clinical Impression Statement  Patient presents to PT with report of chronic right hip pain and s/p CABG on 12/12/2018. He seems to be doing well following the surgery but he is severely limited in his mobility secondary to hip pain. He exhibits significant gait deviations while using a cane and is unable to perform standing daily tasks or exercise due to the hip pain. He exhibits limited right hip mobility and weakness that is also contributing to his mobility deficit. He is planning to have a THA  in the near future, but he would benefit from continued skilled PT to improve his hip strength and motion in order to improve ambulatory ability and preparation for surgery.    Personal Factors and Comorbidities  Fitness;Past/Current Experience;Time since onset of injury/illness/exacerbation;Comorbidity 1    Comorbidities  recent CABG    Examination-Activity Limitations  Locomotion Level;Transfers;Sleep;Squat;Stairs;Stand;Lift;Hygiene/Grooming;Dressing;Carry;Bend    Examination-Participation Restrictions  Meal Prep;Cleaning;Community Activity;Laundry;Yard Work    [employer]chant [employer] officer  Evolving/Moderate complexity    Clinical Decision Making  Moderate    Rehab Potential  Fair    PT Frequency  1x / week    PT Duration  8 weeks    PT Treatment/Interventions  ADLs/Self Care Home Management;Electrical Stimulation;Moist Heat;Cryotherapy;Neuromuscular re-education;Balance training;Therapeutic exercise;Therapeutic activities;Functional mobility training;Stair training;Gait training;Patient/family education;Manual techniques;Dry needling;Passive range of motion;Taping;Spinal Manipulations;Joint Manipulations    PT Next Visit Plan  Sternal precautions*: Assess HEP and progress PRN, NuStep or recumbent bike as able, right hip mobility and strengthening exercises, gait training as able    PT Home Exercise Plan  Supine marching, bent knee fall outs, clamshell with red, bridge, seated hamstring stretch    Recommended Other Services  --    Consulted and Agree with Plan of Care  Patient       Patient will benefit from skilled therapeutic intervention in order to improve the following deficits and impairments:  Abnormal gait, Difficulty walking, Decreased range of motion, Cardiopulmonary status limiting activity, Decreased activity tolerance, Pain, Decreased balance, Impaired flexibility, Improper body mechanics, Decreased mobility, Decreased strength, Postural dysfunction  Visit Diagnosis: Pain in  right hip  Muscle weakness (generalized)  Other abnormalities of gait and mobility     Problem List Patient Active Problem List   Diagnosis Date Noted  . Stenosis of carotid artery 01/02/2019  . Educated about COVID-19 virus infection 01/02/2019  . Postoperative anemia due to acute blood loss 12/31/2018  . Debility 12/21/2018  . 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 Coronary artery disease 12/12/2018  . Coronary artery disease involving native coronary artery of native heart without angina pectoris   . Ischemic cardiomyopathy   . Acute on chronic systolic heart failure ([place]) 10/28/2018  . Preop cardiovascular exam 10/28/2018  . Abnormal EKG 10/28/2018    Hilda Blades, PT, DPT, LAT, ATC 01/28/19  4:34 PM Phone: [phone] Fax: [facility] [address] [place], [place], [address] Phone: [phone]   Fax:  [phone]  Name: NATANEL ANGOTTI MRN: [id number] Date of Birth: 1955/12/01

## 2019-01-29 ENCOUNTER — Ambulatory Visit: Payer: No Typology Code available for payment source

## 2019-02-04 NOTE — Progress Notes (Signed)
Cardiology Office Note   Date:  02/05/2019   ID:  Frank Love, DOB May 16, 1955, MRN [id number]  PCP:  Courtney Heys, MD  Cardiologist:   [name], MD   Chief Complaint  Patient presents with  . Cardiomyopathy      History of Present Illness: Frank Love is a 64 y.o. male who presents for follow up of CAD and cardiomyopathy.  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 He had reduced RV function.  At the last visit I did titrate his beta-blocker slightly.  However, when he went back to see her surgeon his blood pressure was 95/53 and his carvedilol was reduced back down.  He feels okay.  He is not having any presyncope or syncope.  Is not having any chest pressure, neck or arm discomfort.  He has a little discomfort related to the sternal incision.  He is not having any new shortness of breath, PND or orthopnea.  He is not having any cough fevers or chills.  He is very limited because of hip discomfort and he needs to have surgery on this.  Of note he is no longer taking the Plavix.  He is not requiring currently Lasix and so stopped the potassium.   Past Medical History:  Diagnosis Date  . Chronic pain of right knee   . Coronary artery disease   . Hypertension   . Phimosis     Past Surgical History:  Procedure Laterality Date  . CIRCUMCISION N/A 10/12/2018   Procedure: CIRCUMCISION ADULT;  Surgeon: Franchot Gallo, MD;  Location: [facility];  Service: Urology;  Laterality: N/A;  45 mins  . COLONOSCOPY    . CORONARY ARTERY BYPASS GRAFT N/A 12/12/2018   Procedure: CORONARY ARTERY BYPASS GRAFTING (CABG) x four, using bilateral internal mammary arteries and right leg greater saphenous vein harvested endoscopically;  Surgeon: [name], MD;  Location: [place];  Service: Open Heart Surgery;  Laterality: N/A;  . LEFT HEART CATH AND CORONARY ANGIOGRAPHY N/A 11/28/2018   Procedure: LEFT HEART CATH AND CORONARY ANGIOGRAPHY;  Surgeon:  Burnell Blanks, MD;  Location: [place] [facility];  Service: Cardiovascular;  Laterality: N/A;  . MEDIASTINAL EXPLORATION N/A 12/12/2018   Procedure: Mediastinal Re-Exploration after bypass graft;  Surgeon: [name], MD;  Location: [place];  Service: Open Heart Surgery;  Laterality: N/A;  . TEE WITHOUT CARDIOVERSION N/A 12/12/2018   Procedure: TRANSESOPHAGEAL ECHOCARDIOGRAM (TEE);  Surgeon: [name], MD;  Location: [place];  Service: Open Heart Surgery;  Laterality: N/A;  . TOOTH EXTRACTION       Current Outpatient Medications  Medication Sig Dispense Refill  . aspirin EC 81 MG tablet Take 81 mg by mouth at bedtime.     Marland Kitchen atorvastatin (LIPITOR) 40 MG tablet Take 1 tablet (40 mg total) by mouth daily at 6 PM. 30 tablet 1  . carvedilol (COREG) 3.125 MG tablet Take 3.125 mg by mouth 2 (two) times daily with a meal.    . losartan (COZAAR) 25 MG tablet Take 0.5 tablets (12.5 mg total) by mouth at bedtime. 30 tablet 1  . Multiple Vitamins-Iron (MULTIVITAMINS WITH IRON) TABS tablet Take 1 tablet by mouth daily. 30 tablet 1  . pantoprazole (PROTONIX) 20 MG tablet Take 1 tablet (20 mg total) by mouth daily. 30 tablet 1  . promethazine (PHENERGAN) 12.5 MG tablet Take 0.5 tablets (6.25 mg total) by mouth every 8 (eight) hours as needed  for nausea or vomiting. 30 tablet 0  . senna-docusate (SENOKOT-S) 8.6-50 MG tablet Take 2 tablets by mouth at bedtime as needed for mild constipation. 60 tablet 0  . spironolactone (ALDACTONE) 25 MG tablet Take 1 tablet (25 mg total) by mouth daily. 30 tablet 3  . traZODone (DESYREL) 50 MG tablet Take 0.5 tablets (25 mg total) by mouth at bedtime as needed for sleep. 30 tablet 0  . clopidogrel (PLAVIX) 75 MG tablet Take 1 tablet (75 mg total) by mouth daily. (Patient not taking: Reported on 02/05/2019) 30 tablet 1  . digoxin (LANOXIN) 0.125 MG tablet Take 1 tablet (0.125 mg total) by mouth daily. 30 tablet 1  . ferrous [id number] C-folic  acid (TRINSICON / FOLTRIN) capsule Take 1 capsule by mouth 2 (two) times daily after a meal. (Patient not taking: Reported on 02/05/2019) 60 capsule 0  . furosemide (LASIX) 40 MG tablet Take 1 tablet (40 mg total) by mouth daily. . (Patient not taking: Reported on 01/28/2019) 30 tablet 1  . nitroGLYCERIN (NITROSTAT) 0.4 MG SL tablet Place 1 tablet (0.4 mg total) under the tongue every 5 (five) minutes as needed for chest pain. NO more than 3 pills--if you have the need to take 2 pills or more call MD 30 tablet 0  . potassium chloride SA (KLOR-CON) 20 MEQ tablet Take 1 tablet (20 mEq total) by mouth daily. (Patient not taking: Reported on 01/28/2019) 30 tablet 1   No current facility-administered medications for this visit.    Allergies:   Patient has no known allergies.    ROS:  Please see the history of present illness.   Otherwise, review of systems are positive for none.   All other systems are reviewed and negative.    PHYSICAL EXAM: VS:  BP 120/65   Pulse 69   Temp 97.9 F (36.6 C)   Wt 222 lb 3.2 oz (100.8 kg)   SpO2 97%   BMI 27.77 kg/m  , BMI Body mass index is 27.77 kg/m. GEN:  No distress NECK:  No jugular venous distention at 90 degrees, waveform within normal limits, carotid upstroke brisk and symmetric, no bruits, no thyromegaly LYMPHATICS:  No cervical adenopathy LUNGS:  Clear to auscultation bilaterally BACK:  No CVA tenderness CHEST: Well healed sternotomy scar. HEART:  S1 and S2 within normal limits, no S3, no S4, no clicks, no rubs, no murmurs ABD:  Positive bowel sounds normal in frequency in pitch, no bruits, no rebound, no guarding, unable to assess midline mass or bruit with the patient seated. EXT:  2 plus pulses throughout, moderate edema, no cyanosis no clubbing SKIN:  No rashes no nodules NEURO:  Cranial nerves II through XII grossly intact, motor grossly intact throughout PSYCH:  Cognitively intact, oriented to person place and time   EKG:  EKG  ordered  today. Sinus rhythm, rate 69, left axis deviation, old inferior infarct, old anteroseptal infarct, no acute ST-T wave changes, premature atrial contractions.   Recent Labs: 08/03/2018: BNP 316.2 12/13/2018: Magnesium 2.3 12/22/2018: ALT 22 01/03/2019: BUN 36; Creatinine, Ser 1.37; Hemoglobin 11.4; Platelets 287; Potassium 5.1; Sodium 141    Lipid Panel    Component Value Date/Time   CHOL 182 08/03/2018 1024   TRIG 64 08/03/2018 1024   HDL 53 08/03/2018 1024   CHOLHDL 3.4 08/03/2018 1024   LDLCALC 116 (H) 08/03/2018 1024      Wt Readings from Last 3 Encounters:  02/05/19 222 lb 3.2 oz (100.8 kg)  01/14/19 212 lb (  96.2 kg)  01/10/19 213 lb (96.6 kg)     Intervention    Other studies Reviewed: Additional studies/ records that were reviewed today include: None Review of the above records demonstrates:  NA  ASSESSMENT AND PLAN:  CABG:     The patient has no new sypmtoms.  No further cardiovascular testing is indicated.  We will continue with aggressive risk reduction and meds as listed.he did have some mild renal insufficiency on December 10.  He is doing okay with no Lasix and the potassium was stopped.  No change in therapy.  CAROTID STENOSIS:   This was moderate and I will follow this up in 1 year.   ISCHEMIC CARDIOMYOPATHY: I am going to continue the meds as listed.  His blood pressure will not allow med titration.  He is euvolemic.  No change in therapy.   PREOP:   The patient is at acceptable risk for the planned surgery.  He would need to have a careful assessment of his volume and blood pressures at the time of surgery.    Current medicines are reviewed at length with the patient today.  The patient does not have concerns regarding medicines.  The following changes have been made:  None  Labs/ tests ordered today include: None  No orders of the defined types were placed in this encounter.    Disposition:   FU with me APP in six weeks.     Signed, [name], MD  02/05/2019 5:14 PM    [facility]

## 2019-02-05 ENCOUNTER — Ambulatory Visit (INDEPENDENT_AMBULATORY_CARE_PROVIDER_SITE_OTHER): Payer: No Typology Code available for payment source | Admitting: Cardiology

## 2019-02-05 ENCOUNTER — Encounter: Payer: Self-pay | Admitting: Cardiology

## 2019-02-05 ENCOUNTER — Other Ambulatory Visit: Payer: Self-pay

## 2019-02-05 VITALS — BP 120/65 | HR 69 | Temp 97.9°F | Wt 222.2 lb

## 2019-02-05 DIAGNOSIS — I2510 Atherosclerotic heart disease of native coronary artery without angina pectoris: Secondary | ICD-10-CM

## 2019-02-05 DIAGNOSIS — I6529 Occlusion and stenosis of unspecified carotid artery: Secondary | ICD-10-CM

## 2019-02-05 DIAGNOSIS — I255 Ischemic cardiomyopathy: Secondary | ICD-10-CM

## 2019-02-05 NOTE — Patient Instructions (Signed)
Medication Instructions:  NO CHANGES *If you need a refill on your cardiac medications before your next appointment, please call your pharmacy*  Lab Work: NONE  Testing/Procedures: NONE  Follow-Up: At [facility], you and your health needs are our priority.  As part of our continuing mission to provide you with exceptional heart care, we have created designated Provider Care Teams.  These Care Teams include your primary Cardiologist (physician) and Advanced Practice Providers (APPs -  Physician Assistants and Nurse Practitioners) who all work together to provide you with the care you need, when you need it.  Your next appointment:   6 week(s)  The format for your next appointment:   In Person  Provider:   You may see one of the following Advanced Practice Providers on your designated Care Team:    Rosaria Ferries, PA-C  Jory Sims, DNP, ANP  Cadence Kathlen Mody, NP

## 2019-02-06 ENCOUNTER — Ambulatory Visit: Payer: Self-pay | Attending: Family Medicine

## 2019-02-06 ENCOUNTER — Ambulatory Visit: Payer: Self-pay | Admitting: Physical Therapy

## 2019-02-06 ENCOUNTER — Encounter: Payer: Self-pay | Admitting: Physical Therapy

## 2019-02-06 DIAGNOSIS — M6281 Muscle weakness (generalized): Secondary | ICD-10-CM

## 2019-02-06 DIAGNOSIS — R2689 Other abnormalities of gait and mobility: Secondary | ICD-10-CM

## 2019-02-06 DIAGNOSIS — M25551 Pain in right hip: Secondary | ICD-10-CM

## 2019-02-06 NOTE — Therapy (Addendum)
[place], [place], [address] Phone: [phone]   Fax:  [phone]  Physical Therapy Treatment  Patient Details  Name: Frank Love MRN: [id number] Date of Birth: [birth year] Referring Provider (PT): Courtney Heys, MD   Encounter Date: 02/06/2019  PT End of Session - 02/06/19 0953    Visit Number  2    Number of Visits  8    Date for PT Re-Evaluation  03/25/19    Authorization Type  [employer] DISCOUNT    PT Start Time  6160   PT late   PT Stop Time  1014    PT Time Calculation (min)  29 min       Past Medical History:  Diagnosis Date  . Chronic pain of right knee   . Coronary artery disease   . Hypertension   . Phimosis     Past Surgical History:  Procedure Laterality Date  . CIRCUMCISION N/A 10/12/2018   Procedure: CIRCUMCISION ADULT;  Surgeon: Franchot Gallo, MD;  Location: [facility];  Service: Urology;  Laterality: N/A;  45 mins  . COLONOSCOPY    . CORONARY ARTERY BYPASS GRAFT N/A 12/12/2018   Procedure: CORONARY ARTERY BYPASS GRAFTING (CABG) x four, using bilateral internal mammary arteries and right leg greater saphenous vein harvested endoscopically;  Surgeon: [name], MD;  Location: [facility];  Service: Open Heart Surgery;  Laterality: N/A;  . LEFT HEART CATH AND CORONARY ANGIOGRAPHY N/A 11/28/2018   Procedure: LEFT HEART CATH AND CORONARY ANGIOGRAPHY;  Surgeon: Burnell Blanks, MD;  Location: [facility];  Service: Cardiovascular;  Laterality: N/A;  . MEDIASTINAL EXPLORATION N/A 12/12/2018   Procedure: Mediastinal Re-Exploration after bypass graft;  Surgeon: [name], MD;  Location: [facility];  Service: Open Heart Surgery;  Laterality: N/A;  . TEE WITHOUT CARDIOVERSION N/A 12/12/2018   Procedure: TRANSESOPHAGEAL ECHOCARDIOGRAM (TEE);  Surgeon: [name], MD;  Location: [facility];  Service: Open Heart Surgery;  Laterality: N/A;  . TOOTH EXTRACTION       There were no vitals filed for this visit.  Subjective Assessment - 02/06/19 0951    Subjective  Pt arrives late for treatment. He is using Two UE on his [facility] and holding in in front of him to bear weight. He reports pain is worse since last visit. He has been cleared by cardiologist for Hip surgery and is waiting to schedule.    Currently in Pain?  Yes    Pain Score  9    when walking   Pain Location  Hip    Pain Orientation  Right    Pain Descriptors / Indicators  Aching    Pain Type  Chronic pain    Aggravating Factors   weight bearing, and advancing leg    Pain Relieving Factors  rest .sit                       [facility] Adult PT Treatment/Exercise - 02/06/19 0001      Knee/Hip Exercises: Stretches   Active Hamstring Stretch  3 reps;30 seconds    Active Hamstring Stretch Limitations  seated edge of mat    Other Knee/Hip Stretches  small LTR x 10       Knee/Hip Exercises: Seated   Long Arc Quad  10 reps;2 sets    Marching  2 sets;10 reps      Knee/Hip Exercises: Supine   Heel Slides Limitations  alternating marching,  VC for technique    Bridges  10 reps    Bridges Limitations  VC for technique    Other Supine Knee/Hip Exercises  Bent knee fall out x20    Other Supine Knee/Hip Exercises  Clamshell with red band x20               PT Short Term Goals - 01/28/19 1621      PT SHORT TERM GOAL #1   Title  Patient will be independent in HEP to make progress in PT.    Time  4    Period  Weeks    Status  New    Target Date  02/25/19      PT SHORT TERM GOAL #2   Title  Patient will be able to stand from standard height chair without use of UEs for support.    Time  4    Period  Weeks    Status  New    Target Date  02/25/19      PT SHORT TERM GOAL #3   Title  Patient will report ability to stand >/=10 minutes so he can perform tasks to improve heart health.    Time  4    Period  Weeks    Status  New    Target Date  02/25/19        PT Long  Term Goals - 01/28/19 1624      PT LONG TERM GOAL #1   Title  Patient will report right hip pain </= 5/10 with weight bearing tasks to allow for improved mobility.    Time  8    Period  Weeks    Status  New    Target Date  03/25/19      PT LONG TERM GOAL #2   Title  Patient will exhibit improved strength of right hip to grossly >/= 4/5 MMT to allow for improved walking and transfer ability.    Time  8    Period  Weeks    Status  New    Target Date  03/25/19      PT LONG TERM GOAL #3   Title  Patient will exhibit improved hip motion to neutral hip extension and adduction of >/= 10 deg to allow for improved walking ability.    Time  8    Period  Weeks    Status  New    Target Date  03/25/19      PT LONG TERM GOAL #4   Title  Patient will be able to stand >/= 15 minutes with min-mod pain level to allow him to perform tasks around the home and meal prep.    Time  8    Period  Weeks    Status  New    Target Date  03/25/19            Plan - 02/06/19 1016    Clinical Impression Statement  Pt arrived reporting worsening hip pain and pressure in right knee. He is cleared for surgery and is waiting to be scheduled. He arrives late and with SPC, using it in front of him bearing weight through bilat UE. He was educated on need for RW for safety until after surgery. He verbalized understanding. Time spent reviewing HEP which he hd increased pain with all motions. He is comfortable continuing HEP at home until surgery and requests to cancel PT appointments.    PT Next Visit Plan  HOLD PT for now, having surgery  PT Home Exercise Plan  Supine marching, bent knee fall outs, clamshell with red, bridge, seated hamstring stretch       Patient will benefit from skilled therapeutic intervention in order to improve the following deficits and impairments:  Abnormal gait, Difficulty walking, Decreased range of motion, Cardiopulmonary status limiting activity, Decreased activity tolerance, Pain,  Decreased balance, Impaired flexibility, Improper body mechanics, Decreased mobility, Decreased strength, Postural dysfunction  Visit Diagnosis: Pain in right hip  Muscle weakness (generalized)  Other abnormalities of gait and mobility     Problem List Patient Active Problem List   Diagnosis Date Noted  . Stenosis of carotid artery 01/02/2019  . Educated about COVID-19 virus infection 01/02/2019  . Postoperative anemia due to acute blood loss 12/31/2018  . Debility 12/21/2018  . 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 Coronary artery disease 12/12/2018  . Coronary artery disease involving native coronary artery of native heart without angina pectoris   . Ischemic cardiomyopathy   . Acute on chronic systolic heart failure ([place]) 10/28/2018  . Preop cardiovascular exam 10/28/2018  . Abnormal EKG 10/28/2018    Frank Love , PTA 02/06/2019, 10:31 AM  [facility] [address] [place], [place], [address] Phone: [phone]   Fax:  [phone]  Name: Frank Love MRN: [id number] Date of Birth: August 06, 1955    PHYSICAL THERAPY DISCHARGE SUMMARY  Visits from Start of Care: 2  Current functional level related to goals / functional outcomes: Patient did not meet functional goals, he was scheduled for surgery   Remaining deficits: Strength, motion, gait, pain   Education / Equipment: HEP  Plan: Patient agrees to discharge.  Patient goals were not met. Patient is being discharged due to a change in medical status.  ?????    Hilda Blades, PT, DPT 03/07/19  3:53 PM Phone: [phone] Fax: [phone]

## 2019-02-08 ENCOUNTER — Encounter: Payer: Self-pay | Admitting: Physical Medicine and Rehabilitation

## 2019-02-08 ENCOUNTER — Other Ambulatory Visit: Payer: Self-pay

## 2019-02-08 ENCOUNTER — Encounter
Payer: No Typology Code available for payment source | Attending: Physical Medicine and Rehabilitation | Admitting: Physical Medicine and Rehabilitation

## 2019-02-08 VITALS — BP 120/65 | HR 69 | Temp 98.0°F | Ht 75.0 in | Wt 218.0 lb

## 2019-02-08 DIAGNOSIS — M1611 Unilateral primary osteoarthritis, right hip: Secondary | ICD-10-CM

## 2019-02-08 DIAGNOSIS — Z951 Presence of aortocoronary bypass graft: Secondary | ICD-10-CM

## 2019-02-08 NOTE — Progress Notes (Signed)
Subjective:    Patient ID: Frank Love, male    DOB: [birth year], 64 y.o.   MRN: [id number]  HPI  Patient is a 64 yr old male s/p CABG x4- done 12/11/2018  Pt went to therapy 2x but they said he was doing so well, can do HEP at home.  Doing HEP 1-2x/day- wife"doesn't usually see him do it" , but pt assures me he does it regularly.  No chest pain or any other pain except R hip- was trying to get R hip replaced, however new surgeries are on hold for elective surgeries.  Pt has been cleared to drive for 2+ weeks now.     Pain Inventory Average Pain 0 Pain Right Now 0 My pain is na  In the last 24 hours, has pain interfered with the following? General activity 0 Relation with others 0 Enjoyment of life 0 What TIME of day is your pain at its worst? na Sleep (in general) Good  Pain is worse with: na Pain improves with: na Relief from Meds: na  Mobility walk with assistance use a cane use a walker ability to climb steps?  yes do you drive?  yes  Function not employed: date last employed .  Neuro/Psych numbness tingling trouble walking  Prior Studies Any changes since last visit?  no  Physicians involved in your care Any changes since last visit?  no   Family History  Problem Relation Age of Onset  . Diabetes Mother   . Heart disease Mother    Social History   Socioeconomic History  . Marital status: Married    Spouse name: Not on file  . Number of children: Not on file  . Years of education: Not on file  . Highest education level: Not on file  Occupational History  . Not on file  Tobacco Use  . Smoking status: Never Smoker  . Smokeless tobacco: Never Used  Substance and Sexual Activity  . Alcohol use: No    Alcohol/week: 0.0 standard drinks  . Drug use: No  . Sexual activity: Yes  Other Topics Concern  . Not on file  Social History Narrative   Lives with wife.     Social Determinants of Health   Financial Resource Strain:   .  Difficulty of Paying Living Expenses: Not on file  Food Insecurity:   . Worried About Charity fundraiser in the Last Year: Not on file  . Ran Out of Food in the Last Year: Not on file  Transportation Needs:   . Lack of Transportation (Medical): Not on file  . Lack of Transportation (Non-Medical): Not on file  Physical Activity:   . Days of Exercise per Week: Not on file  . Minutes of Exercise per Session: Not on file  Stress:   . Feeling of Stress : Not on file  Social Connections:   . Frequency of Communication with Friends and Family: Not on file  . Frequency of Social Gatherings with Friends and Family: Not on file  . Attends Religious Services: Not on file  . Active Member of Clubs or Organizations: Not on file  . Attends Archivist Meetings: Not on file  . Marital Status: Not on file   Past Surgical History:  Procedure Laterality Date  . CIRCUMCISION N/A 10/12/2018   Procedure: CIRCUMCISION ADULT;  Surgeon: Franchot Gallo, MD;  Location: [facility];  Service: Urology;  Laterality: N/A;  45 mins  . COLONOSCOPY    . CORONARY  ARTERY BYPASS GRAFT N/A 12/12/2018   Procedure: CORONARY ARTERY BYPASS GRAFTING (CABG) x four, using bilateral internal mammary arteries and right leg greater saphenous vein harvested endoscopically;  Surgeon: [name], MD;  Location: [facility];  Service: Open Heart Surgery;  Laterality: N/A;  . LEFT HEART CATH AND CORONARY ANGIOGRAPHY N/A 11/28/2018   Procedure: LEFT HEART CATH AND CORONARY ANGIOGRAPHY;  Surgeon: Burnell Blanks, MD;  Location: [facility];  Service: Cardiovascular;  Laterality: N/A;  . MEDIASTINAL EXPLORATION N/A 12/12/2018   Procedure: Mediastinal Re-Exploration after bypass graft;  Surgeon: [name], MD;  Location: [facility] [place];  Service: Open Heart Surgery;  Laterality: N/A;  . TEE WITHOUT CARDIOVERSION N/A 12/12/2018   Procedure: TRANSESOPHAGEAL ECHOCARDIOGRAM (TEE);  Surgeon: [name], MD;   Location: [facility];  Service: Open Heart Surgery;  Laterality: N/A;  . TOOTH EXTRACTION     Past Medical History:  Diagnosis Date  . Chronic pain of right knee   . Coronary artery disease   . Hypertension   . Phimosis    There were no vitals taken for this visit.  Opioid Risk Score:   Fall Risk Score:  `1  Depression screen PHQ 2/9  Depression screen [facility] 2/9 07/06/2018 06/12/2018 04/17/2015  Decreased Interest 0 0 0  Down, Depressed, Hopeless 0 0 0  PHQ - 2 Score 0 0 0  Altered sleeping 0 1 -  Tired, decreased energy 0 0 -  Change in appetite 0 0 -  Feeling bad or failure about yourself  0 0 -  Trouble concentrating 0 0 -  Moving slowly or fidgety/restless 0 0 -  Suicidal thoughts 0 0 -  PHQ-9 Score 0 1 -     Review of Systems  Constitutional: Negative.   HENT: Negative.   Eyes: Negative.   Respiratory: Negative.   Cardiovascular: Negative.   Gastrointestinal: Negative.   Endocrine: Negative.   Genitourinary: Negative.   Musculoskeletal: Positive for gait problem.  Skin: Negative.   Allergic/Immunologic: Negative.   Neurological: Positive for numbness.  Hematological: Negative.   Psychiatric/Behavioral: Negative.   All other systems reviewed and are negative.      Objective:   Physical Exam  [place] Appears well; no puffiness or face Looks like did in pt rehab rehab      Assessment & Plan:   Patient is a 64 yr old male s/p CABG x4- done 12/11/2018  1. Pt is discharged from our office- can call if needs [place] as needed, but continue his HEP at least daily and help wife around house please.  2. Done with sternal precautions and can drive again.  3. No f/u necessary.

## 2019-02-08 NOTE — Patient Instructions (Signed)
Patient is a 64 yr old male s/p CABG x4- done 12/11/2018  1. Pt is discharged from our office- can call if needs [place] as needed, but continue his HEP at least daily and help wife around house please.  2. Done with sternal precautions and can drive again.  3. No f/u necessary.

## 2019-02-11 MED FILL — LOSARTAN POTASSIUM 25 MG TA: 25 | 30 days supply | Qty: 30 | Fill #1

## 2019-02-12 ENCOUNTER — Ambulatory Visit: Payer: Self-pay | Admitting: Nurse Practitioner

## 2019-02-12 ENCOUNTER — Other Ambulatory Visit: Payer: Self-pay

## 2019-02-13 ENCOUNTER — Ambulatory Visit (INDEPENDENT_AMBULATORY_CARE_PROVIDER_SITE_OTHER): Payer: Self-pay | Admitting: Orthopaedic Surgery

## 2019-02-13 ENCOUNTER — Other Ambulatory Visit: Payer: Self-pay

## 2019-02-13 ENCOUNTER — Encounter: Payer: Self-pay | Admitting: Orthopaedic Surgery

## 2019-02-13 VITALS — Ht 73.0 in | Wt 221.2 lb

## 2019-02-13 DIAGNOSIS — M25551 Pain in right hip: Secondary | ICD-10-CM

## 2019-02-13 DIAGNOSIS — M1611 Unilateral primary osteoarthritis, right hip: Secondary | ICD-10-CM

## 2019-02-13 NOTE — Progress Notes (Signed)
Office Visit Note   Patient: Frank Love           Date of Birth: 12/10/1955           MRN: [id number] Visit Date: 02/13/2019              Requested by: [name], NP [place],  [place] [id number] PCP: [name], NP   Assessment & Plan: Visit Diagnoses:  1. Primary osteoarthritis of right hip   2. Right hip pain     Plan: At this point I agree with getting him set up for a total hip arthroplasty.  He understands fully what the surgery involves.  We have shown him a hip model and a handout for hip placement surgery.  We had a long and thorough discussion about her goals.  We talked about the risks and significance.  We did discuss his interoperative postoperative course.  We would need him off of his Plavix 7 days before surgery.  We would like to have this done under spinal anesthesia as well and he understands this.  He also understands that given the coronavirus pandemic that there are some delays in this scheduling total joint replacement surgery.  Once we are allowed to schedule surgery we will do so.  He is someone that I would definitely need to keep him overnight given his comorbidities.  All question concerns were answered and addressed.  We will work on getting things scheduled.  Follow-Up Instructions: Return for 2 weeks post-op.   Orders:  No orders of the defined types were placed in this encounter.  No orders of the defined types were placed in this encounter.     Procedures: No procedures performed   Clinical Data: No additional findings.   Subjective: Chief Complaint  Patient presents with  . Right Hip - Pain  Patient is well-known to me.  He is a 64 year old with end-stage arthritis involving his right hip.  We have seen and evaluated him for this hip before.  It is definitely end-stage arthritis.  We have recommended hip replacement surgery.  However in November he had a four-vessel bypass CABG.  Since then he has been  cleared by cardiology to proceed with hip replacement surgery.  He has been on and off Plavix.  The notes from cardiology say he can be off Plavix but I believe the notes from the heart surgeon say that he still needs to be on Plavix.  Regardless though, he has been cleared for surgery.  His right hip pain is daily and it is detrimentally affecting his activities a living, his quality life and his mobility to the point point that he does wish to proceed with this.  His pain is 10 out of 10.  All forms of conservative treatment have been tried and failed for this right hip.  HPI  Review of Systems He currently denies any headache, chest pain, shortness of breath, fever, chills, nausea, vomiting  Objective: Vital Signs: Ht 6\' 1"  (1.854 m)   Wt 221 lb 3.2 oz (100.3 kg)   BMI 29.18 kg/m   Physical Exam He is alert and oriented x3 and in no acute distress Ortho Exam Examination of his right hip shows severe pain with any attempts of internal and external rotation and significant stiffness with rotation where I cannot rotate him fully.  He does have problems trying to cross his leg and get on his shoe and sock on the right side.  Specialty Comments:  No specialty comments available.  Imaging: No results found. X-rays from earlier this year of his pelvis and right hip shows severe end-stage arthritis of the right hip.  There is complete loss of joint space.  There are sclerotic and cystic changes in the femoral head and acetabulum.  PMFS History: Patient Active Problem List   Diagnosis Date Noted  . Primary osteoarthritis of right hip 02/08/2019  . Stenosis of carotid artery 01/02/2019  . Educated about COVID-19 virus infection 01/02/2019  . Postoperative anemia due to acute blood loss 12/31/2018  . Debility 12/21/2018  . 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 Coronary artery disease 12/12/2018  . Coronary artery disease involving native coronary artery of native heart without angina pectoris   .  Ischemic cardiomyopathy   . Acute on chronic systolic heart failure ([place]) 10/28/2018  . Preop cardiovascular exam 10/28/2018  . Abnormal EKG 10/28/2018   Past Medical History:  Diagnosis Date  . Chronic pain of right knee   . Coronary artery disease   . Hypertension   . Phimosis     Family History  Problem Relation Age of Onset  . Diabetes Mother   . Heart disease Mother     Past Surgical History:  Procedure Laterality Date  . CIRCUMCISION N/A 10/12/2018   Procedure: CIRCUMCISION ADULT;  Surgeon: Franchot Gallo, MD;  [facility];  Service: Urology;  Laterality: N/A;  45 mins  . COLONOSCOPY    . CORONARY ARTERY BYPASS GRAFT N/A 12/12/2018   Procedure: CORONARY ARTERY BYPASS GRAFTING (CABG) x four, using bilateral internal mammary arteries and right leg greater saphenous vein harvested endoscopically;  Surgeon: [name], MD;  Location: [place];  Service: Open Heart Surgery;  Laterality: N/A;  . LEFT HEART CATH AND CORONARY ANGIOGRAPHY N/A 11/28/2018   Procedure: LEFT HEART CATH AND CORONARY ANGIOGRAPHY;  Surgeon: Burnell Blanks, MD;  Location: [facility] [facility];  Service: Cardiovascular;  Laterality: N/A;  . MEDIASTINAL EXPLORATION N/A 12/12/2018   Procedure: Mediastinal Re-Exploration after bypass graft;  Surgeon: [name], MD;  Location: [place];  Service: Open Heart Surgery;  Laterality: N/A;  . TEE WITHOUT CARDIOVERSION N/A 12/12/2018   Procedure: TRANSESOPHAGEAL ECHOCARDIOGRAM (TEE);  Surgeon: [name], MD;  Location: [facility];  Service: Open Heart Surgery;  Laterality: N/A;  . TOOTH EXTRACTION     Social History   Occupational History  . Not on file  Tobacco Use  . Smoking status: Never Smoker  . Smokeless tobacco: Never Used  Substance and Sexual Activity  . Alcohol use: No    Alcohol/week: 0.0 standard drinks  . Drug use: No  . Sexual activity: Yes

## 2019-02-14 ENCOUNTER — Ambulatory Visit: Payer: Self-pay | Admitting: Physical Therapy

## 2019-02-14 NOTE — Addendum Note (Signed)
Addended by: Hinton Dyer on: 02/14/2019 10:00 AM   Modules accepted: Orders

## 2019-02-18 ENCOUNTER — Telehealth: Payer: Self-pay | Admitting: Cardiology

## 2019-02-18 MED FILL — ?SPIRONOLACTONE 25 MG TABLE: 25 | 30 days supply | Qty: 30 | Fill #2

## 2019-02-18 NOTE — Telephone Encounter (Signed)
Per office 02/05/2019 no changes, need to confirm pharmacy  Left message to call back

## 2019-02-18 NOTE — Telephone Encounter (Signed)
  Pt c/o medication issue:  1. Name of Medication:  digoxin (LANOXIN) 0.125 MG tablet  pantoprazole (PROTONIX) 20 MG tablet  carvedilol (COREG) 3.125 MG tablet  atorvastatin (LIPITOR) 40 MG tablet  2. How are you currently taking this medication (dosage and times per day)? All once a day except carvedilol, which is twice a day  3. Are you having a reaction (difficulty breathing--STAT)? no  4. What is your medication issue? Patient would like a call back to find out if he needs to continue taking these medications and if so, if he can get a prescription from Dr. Percival Spanish.

## 2019-02-19 MED ORDER — PANTOPRAZOLE SODIUM 20 MG PO TBEC: 20.0000 mg | DELAYED_RELEASE_TABLET | Freq: Every day | ORAL | 5 refills | Status: DC

## 2019-02-19 MED ORDER — ATORVASTATIN CALCIUM 40 MG PO TABS: 40.0000 mg | ORAL_TABLET | Freq: Every day | ORAL | 5 refills | Status: DC

## 2019-02-19 MED ORDER — CARVEDILOL 3.125 MG PO TABS: 3.1250 mg | ORAL_TABLET | Freq: Two times a day (BID) | ORAL | 5 refills | Status: DC

## 2019-02-19 MED ORDER — DIGOXIN 125 MCG PO TABS: 0.1250 mg | ORAL_TABLET | Freq: Every day | ORAL | 5 refills | Status: DC

## 2019-02-19 MED FILL — ?ATORVASTATIN 40MG TABL: 40 | 30 days supply | Qty: 30 | Fill #0

## 2019-02-19 MED FILL — ?DIGITEK 125 MCG TABLET: 125 | 30 days supply | Qty: 30 | Fill #0

## 2019-02-19 MED FILL — ?CARVEDILOL 3.125 MG TABLET: 3.125 | 30 days supply | Qty: 60 | Fill #0

## 2019-02-19 MED FILL — PANTOPRAZOLE SOD DR 20 MG T: 20 | 30 days supply | Qty: 30 | Fill #0

## 2019-02-19 NOTE — Telephone Encounter (Signed)
Spoke with patient and refilled as requested.

## 2019-02-20 ENCOUNTER — Encounter: Payer: No Typology Code available for payment source | Admitting: Physical Therapy

## 2019-02-20 ENCOUNTER — Telehealth: Payer: Self-pay | Admitting: *Deleted

## 2019-02-20 NOTE — Telephone Encounter (Signed)
Frank Love, Patient Care Manager, [facility] left a message requesting verbal orders for Home health visits 1 week 4.  Medical record reviewed. Social work note reviewed.  Verbal orders given per office protocol.

## 2019-02-22 DIAGNOSIS — I110 Hypertensive heart disease with heart failure: Secondary | ICD-10-CM

## 2019-02-22 DIAGNOSIS — I5023 Acute on chronic systolic (congestive) heart failure: Secondary | ICD-10-CM

## 2019-02-22 DIAGNOSIS — G8929 Other chronic pain: Secondary | ICD-10-CM

## 2019-02-22 DIAGNOSIS — Z7902 Long term (current) use of antithrombotics/antiplatelets: Secondary | ICD-10-CM

## 2019-02-22 DIAGNOSIS — Z7982 Long term (current) use of aspirin: Secondary | ICD-10-CM

## 2019-02-22 DIAGNOSIS — I255 Ischemic cardiomyopathy: Secondary | ICD-10-CM

## 2019-02-22 DIAGNOSIS — Z48812 Encounter for surgical aftercare following surgery on the circulatory system: Secondary | ICD-10-CM

## 2019-02-22 DIAGNOSIS — M1611 Unilateral primary osteoarthritis, right hip: Secondary | ICD-10-CM

## 2019-02-22 DIAGNOSIS — I2510 Atherosclerotic heart disease of native coronary artery without angina pectoris: Secondary | ICD-10-CM

## 2019-02-22 DIAGNOSIS — Z9181 History of falling: Secondary | ICD-10-CM

## 2019-02-25 NOTE — Patient Instructions (Addendum)
DUE TO COVID-19 ONLY ONE VISITOR IS ALLOWED TO COME WITH YOU AND STAY IN THE WAITING ROOM ONLY DURING PRE OP AND PROCEDURE DAY OF SURGERY. THE 1 VISITOR MAY VISIT WITH YOU AFTER SURGERY IN YOUR PRIVATE ROOM DURING VISITING HOURS ONLY!  YOU NEED TO HAVE A COVID 19 TEST ON_2/9/21______ @_9 :30______, THIS TEST MUST BE DONE BEFORE SURGERY, COME  [name] , [id number].  ([place]) ONCE YOUR COVID TEST IS COMPLETED, PLEASE BEGIN THE QUARANTINE INSTRUCTIONS AS OUTLINED IN YOUR HANDOUT.                [place]   Your procedure is scheduled on: 03/08/19   Report to [facility] Main  Entrance   Report to admitting at  6:45 AM     Call this number if you have problems the morning of surgery [place], NO CHEWING GUM [place].   Do not eat food After Midnight.   YOU MAY HAVE CLEAR LIQUIDS FROM MIDNIGHT UNTIL 6:00 AM  . At 6:00  AM Please finish the prescribed Pre-Surgery  drink.   Nothing by mouth after you finish the  drink !   Take these medicines the morning of surgery with A SIP OF WATER: Digoxin, Coreg, Protonix                                 You may not have any metal on your body including              piercings  Do not wear jewelry, lotions, powders or  deodorant                         Men may shave face and neck.   Do not bring valuables to the hospital. [place].  Contacts, dentures or bridgework may not be worn into surgery.       Patients discharged the day of surgery will not be allowed to drive home.  IF YOU ARE HAVING SURGERY AND GOING HOME THE SAME DAY, YOU MUST HAVE AN ADULT TO DRIVE YOU HOME AND BE WITH YOU FOR 24 HOURS.  YOU MAY GO HOME BY TAXI OR UBER OR ORTHERWISE, BUT AN ADULT MUST ACCOMPANY YOU HOME AND STAY WITH YOU FOR 24 HOURS.  Name and phone number of your driver:  Special  Instructions: N/A              Please read over the following fact sheets you were given: _____________________________________________________________________             [facility] - Preparing for Surgery  Before surgery, you can play an important role.   Because skin is not sterile, your skin needs to be as free of germs as possible.   You can reduce the number of germs on your skin by washing with CHG (chlorahexidine gluconate) soap before surgery.   CHG is an antiseptic cleaner which kills germs and bonds with the skin to continue killing germs even after washing. Please DO NOT use if you have an allergy to CHG or antibacterial soaps.   If your skin becomes reddened/irritated stop using the CHG and inform your nurse when you  arrive at Short Stay.  You may shave your face/neck.  Please follow these instructions carefully:  1.  Shower with CHG Soap the night before surgery and the  morning of Surgery.  2.  If you choose to wash your hair, wash your hair first as usual with your  normal  shampoo.  3.  After you shampoo, rinse your hair and body thoroughly to remove the  shampoo.                                        4.  Use CHG as you would any other liquid soap.  You can apply chg directly  to the skin and wash                       Gently with a scrungie or clean washcloth.  5.  Apply the CHG Soap to your body ONLY FROM THE NECK DOWN.   Do not use on face/ open                           Wound or open sores. Avoid contact with eyes, ears mouth and genitals (private parts).                       Wash face,  Genitals (private parts) with your normal soap.             6.  Wash thoroughly, paying special attention to the area where your surgery  will be performed.  7.  Thoroughly rinse your body with warm water from the neck down.  8.  DO NOT shower/wash with your normal soap after using and rinsing off  the CHG Soap.             9.  Pat yourself dry with a clean towel.            10.   Wear clean pajamas.            11.  Place clean sheets on your bed the night of your first shower and do not  sleep with pets. Day of Surgery : Do not apply any lotions/deodorants the morning of surgery.  Please wear clean clothes to the hospital/surgery center.  FAILURE TO FOLLOW THESE INSTRUCTIONS MAY RESULT IN THE CANCELLATION OF YOUR SURGERY PATIENT SIGNATURE_________________________________  NURSE SIGNATURE__________________________________  ________________________________________________________________________   Adam [place]ix  An incentive spirometer is a tool that can help keep your lungs clear and active. This tool measures how well you are filling your lungs with each breath. Taking long deep breaths may help reverse or decrease the chance of developing breathing (pulmonary) problems (especially infection) following:  A long period of time when you are unable to move or be active. BEFORE THE PROCEDURE   If the spirometer includes an indicator to show your best effort, your nurse or respiratory therapist will set it to a desired goal.  If possible, sit up straight or lean slightly forward. Try not to slouch.  Hold the incentive spirometer in an upright position. INSTRUCTIONS FOR USE  1. Sit on the edge of your bed if possible, or sit up as far as you can in bed or on a chair. 2. Hold the incentive spirometer in an upright position. 3. Breathe out normally. 4. Place the mouthpiece in your mouth and  seal your lips tightly around it. 5. Breathe in slowly and as deeply as possible, raising the piston or the ball toward the top of the column. 6. Hold your breath for 3-5 seconds or for as long as possible. Allow the piston or ball to fall to the bottom of the column. 7. Remove the mouthpiece from your mouth and breathe out normally. 8. Rest for a few seconds and repeat Steps 1 through 7 at least 10 times every 1-2 hours when you are awake. Take your time and take a few  normal breaths between deep breaths. 9. The spirometer may include an indicator to show your best effort. Use the indicator as a goal to work toward during each repetition. 10. After each set of 10 deep breaths, practice coughing to be sure your lungs are clear. If you have an incision (the cut made at the time of surgery), support your incision when coughing by placing a pillow or rolled up towels firmly against it. Once you are able to get out of bed, walk around indoors and cough well. You may stop using the incentive spirometer when instructed by your caregiver.  RISKS AND COMPLICATIONS  Take your time so you do not get dizzy or light-headed.  If you are in pain, you may need to take or ask for pain medication before doing incentive spirometry. It is harder to take a deep breath if you are having pain. AFTER USE  Rest and breathe slowly and easily.  It can be helpful to keep track of a log of your progress. Your caregiver can provide you with a simple table to help with this. If you are using the spirometer at home, follow these instructions: [place] IF:   You are having difficultly using the spirometer.  You have trouble using the spirometer as often as instructed.  Your pain medication is not giving enough relief while using the spirometer.  You develop fever of 100.5 F (38.1 C) or higher. SEEK IMMEDIATE MEDICAL CARE IF:   You cough up bloody sputum that had not been present before.  You develop fever of 102 F (38.9 C) or greater.  You develop worsening pain at or near the incision site. MAKE SURE YOU:   Understand these instructions.  Will watch your condition.  Will get help right away if you are not doing well or get worse. Document Released: 05/23/2006 Document Revised: 04/04/2011 Document Reviewed: 07/24/2006 [facility] Patient Information 2014 [place], [place].   ________________________________________________________________________

## 2019-02-26 ENCOUNTER — Other Ambulatory Visit: Payer: Self-pay | Admitting: Physician Assistant

## 2019-02-26 ENCOUNTER — Encounter (HOSPITAL_COMMUNITY)
Admission: RE | Admit: 2019-02-26 | Discharge: 2019-02-26 | Disposition: A | Discharge status: Home / Self Care | Payer: Self-pay | Source: Ambulatory Visit | Attending: Orthopaedic Surgery | Admitting: Orthopaedic Surgery

## 2019-02-26 ENCOUNTER — Encounter (HOSPITAL_COMMUNITY): Payer: Self-pay

## 2019-02-26 ENCOUNTER — Other Ambulatory Visit: Payer: Self-pay

## 2019-02-26 DIAGNOSIS — Z01810 Encounter for preprocedural cardiovascular examination: Secondary | ICD-10-CM | POA: Insufficient documentation

## 2019-02-26 DIAGNOSIS — Z01812 Encounter for preprocedural laboratory examination: Secondary | ICD-10-CM | POA: Insufficient documentation

## 2019-02-26 NOTE — Progress Notes (Signed)
PCP - [facility] Cardiologist - Dr. Lowella Dandy  Chest x-ray -01/07/19 EKG - 02/05/19 Stress Test - no ECHO -12/17/18  Cardiac Cath - 11/28/18 CABGx4 12/11/18  Sleep Study - no CPAP - no  Fasting Blood Sugar - NA Checks Blood Sugar _____ times a day  Blood Thinner Instructions:Plavix, ASA Aspirin Instructions:Pt has been off Plavix since 12/31. Pt will stop the ASA 5 days prior to DOS. Last Dose:03/03/19  Anesthesia review:   Patient denies shortness of breath, fever, cough and chest pain at PAT appointment yes  Patient verbalized understanding of instructions that were given to them at the PAT appointment. Patient was also instructed that they will need to review over the PAT instructions again at home before surgery. yes

## 2019-02-27 ENCOUNTER — Encounter (HOSPITAL_COMMUNITY)
Admission: RE | Admit: 2019-02-27 | Discharge: 2019-02-27 | Disposition: A | Discharge status: Home / Self Care | Payer: Self-pay | Source: Ambulatory Visit | Attending: Orthopaedic Surgery | Admitting: Orthopaedic Surgery

## 2019-02-27 ENCOUNTER — Encounter: Payer: No Typology Code available for payment source | Admitting: Physical Therapy

## 2019-02-27 LAB — SURGICAL PCR SCREEN
MRSA, PCR: NEGATIVE
Staphylococcus aureus: NEGATIVE

## 2019-02-27 LAB — CBC
HCT: 39.6 % (ref 39.0–52.0)
Hemoglobin: 12.2 g/dL — ABNORMAL LOW (ref 13.0–17.0)
MCH: 27.7 pg (ref 26.0–34.0)
MCHC: 30.8 g/dL (ref 30.0–36.0)
MCV: 89.8 fL (ref 80.0–100.0)
Platelets: 180 10*3/uL (ref 150–400)
RBC: 4.41 MIL/uL (ref 4.22–5.81)
RDW: 13.7 % (ref 11.5–15.5)
WBC: 6.1 10*3/uL (ref 4.0–10.5)
nRBC: 0 % (ref 0.0–0.2)

## 2019-02-27 LAB — BASIC METABOLIC PANEL
Anion gap: 8 (ref 5–15)
BUN: 26 mg/dL — ABNORMAL HIGH (ref 8–23)
CO2: 27 mmol/L (ref 22–32)
Calcium: 9.2 mg/dL (ref 8.9–10.3)
Chloride: 108 mmol/L (ref 98–111)
Creatinine, Ser: 1.06 mg/dL (ref 0.61–1.24)
GFR calc Af Amer: >60 mL/min (ref >60)
GFR calc non Af Amer: >60 mL/min (ref >60)
Glucose, Bld: 105 mg/dL — ABNORMAL HIGH (ref 70–99)
Potassium: 4.3 mmol/L (ref 3.5–5.1)
Sodium: 143 mmol/L (ref 135–145)

## 2019-03-05 ENCOUNTER — Other Ambulatory Visit (HOSPITAL_COMMUNITY)
Admission: RE | Admit: 2019-03-05 | Discharge: 2019-03-05 | Disposition: A | Discharge status: Home / Self Care | Payer: HRSA Program | Source: Ambulatory Visit | Attending: Orthopaedic Surgery | Admitting: Orthopaedic Surgery

## 2019-03-05 DIAGNOSIS — Z20822 Contact with and (suspected) exposure to covid-19: Secondary | ICD-10-CM | POA: Insufficient documentation

## 2019-03-05 DIAGNOSIS — Z01812 Encounter for preprocedural laboratory examination: Secondary | ICD-10-CM | POA: Diagnosis present

## 2019-03-05 LAB — SARS CORONAVIRUS 2 (TAT 6-24 HRS): SARS Coronavirus 2: NEGATIVE

## 2019-03-05 NOTE — Anesthesia Preprocedure Evaluation (Addendum)
Anesthesia Evaluation  Patient identified by MRN, date of birth, ID band Patient awake    Reviewed: Allergy & Precautions, NPO status , Patient's Chart, lab work & pertinent test results, reviewed documented beta blocker date and time   History of Anesthesia Complications Negative for: history of anesthetic complications  Airway Mallampati: II  TM Distance: >3 FB Neck ROM: Full    Dental  (+) Missing,    Pulmonary neg pulmonary ROS,    Pulmonary exam normal        Cardiovascular hypertension, Pt. on home beta blockers and Pt. on medications + CAD (on Plavix) and + CABG (11/2018)  Normal cardiovascular exam  Echo 12/17/2018: EF 30-35%, global hypokinesis, severely reduced RV systolic function, severely enlarged RV, mild TR, RV moderately-severely enlarged (comparable to LV) with severely reduced function, significantly worse compared to images from 10/02/18.     Neuro/Psych negative neurological ROS  negative psych ROS   GI/Hepatic Neg liver ROS, GERD  Medicated and Controlled,  Endo/Other  negative endocrine ROS  Renal/GU negative Renal ROS  negative genitourinary   Musculoskeletal  (+) Arthritis ,   Abdominal   Peds  Hematology  (+) anemia , Hgb 12.2   Anesthesia Other Findings Day of surgery medications reviewed with patient.  Reproductive/Obstetrics negative OB ROS                           Anesthesia Physical Anesthesia Plan  ASA: IV  Anesthesia Plan: Spinal   Post-op Pain Management:    Induction:   PONV Risk Score and Plan: 2 and Treatment may vary due to age or medical condition, Ondansetron, Dexamethasone and Midazolam  Airway Management Planned: Natural Airway and Simple Face Mask  Additional Equipment: Arterial line  Intra-op Plan:   Post-operative Plan:   Informed Consent: I have reviewed the patients History and Physical, chart, labs and discussed the procedure  including the risks, benefits and alternatives for the proposed anesthesia with the patient or authorized representative who has indicated his/her understanding and acceptance.       Plan Discussed with: CRNA  Anesthesia Plan Comments: (See PAT note 02/27/2019, Konrad Felix, PA-C)      Anesthesia Quick Evaluation

## 2019-03-05 NOTE — Progress Notes (Signed)
Anesthesia Chart Review   Case: [id number] Date/Time: 03/08/19 0900   Procedure: RIGHT TOTAL HIP ARTHROPLASTY ANTERIOR APPROACH (Right Hip)   Anesthesia type: Spinal   Pre-op diagnosis: right hip osteoarthritis   Location: [facility] ROOM 10 / [facility]   Surgeons: Mcarthur Rossetti, MD      DISCUSSION:63 y.o. never smoker with h/o HTN, CAD (s/p CABG), ischemic cardiomyopathy, right hip OA scheduled for above procedure 03/08/19 with Dr. Jean Rosenthal.   Pt last seen by cardiologist, Dr. [name], 02/05/19.  Per OV note, "The patient is at acceptable risk for the planned surgery.  He would need to have a careful assessment of his volume and blood pressures at the time of surgery."  Advised to hold Plavix 5 days prior to procedure.   Anticipate pt can proceed with planned procedure barring acute status change and after evaluation DOS.  VS: BP (!) 146/80   Pulse 67   Temp 36.8 C (Oral)   Resp 18   Ht 6\' 3"  (1.905 m)   Wt 101.4 kg   SpO2 100%   BMI 27.94 kg/m   PROVIDERS: [name], NP is PCP   [name], MD is Cardiologist  LABS: Labs reviewed: Acceptable for surgery. (all labs ordered are listed, but only abnormal results are displayed)  Labs Reviewed  BASIC METABOLIC PANEL - Abnormal; Notable for the following components:      Result Value   Glucose, Bld 105 (*)    BUN 26 (*)    All other components within normal limits  CBC - Abnormal; Notable for the following components:   Hemoglobin 12.2 (*)    All other components within normal limits  SURGICAL PCR SCREEN     IMAGES:   EKG: 02/05/19 Rate 69 bpm Normal sinus rhythm  Left axis deviation Inferior infarct, age undetermined Anterolateral infarct, age undetermined  CV: Echo 12/17/2018 IMPRESSIONS    1. Left ventricular ejection fraction, by visual estimation, is 30 to  35%. The left ventricle has severely decreased function. There is no left  ventricular hypertrophy.  2. Definity  contrast agent was given IV to delineate the left ventricular  endocardial borders.  3. Mild to moderately dilated left ventricular internal cavity size.  4. The left ventricle demonstrates global hypokinesis.  5. Global right ventricle has severely reduced systolic function.The  right ventricular size is severely enlarged. Right vetricular wall  thickness was not assessed.  6. Left atrial size was not assessed.  7. Right atrial size was not assessed.  8. Small pericardial effusion.  9. The mitral valve is grossly normal. Trace mitral valve regurgitation.  10. The tricuspid valve is normal in structure. Tricuspid valve  regurgitation is mild.  11. The aortic valve is normal in structure. Aortic valve regurgitation is  not visualized.  12. The pulmonic valve was grossly normal. Pulmonic valve regurgitation is  not visualized.  13. RV appears to be moderately-severely enlarged (comparable to LV) with  severely reduced function. Significantly worse compared to images from  10/02/18.  14. The interatrial septum was not assessed.   Cardiac Cath 11/28/2018  Mid RCA lesion is 100% stenosed.  Ost LM to Mid LM lesion is 20% stenosed.  2nd Mrg lesion is 100% stenosed.  Mid LAD-1 lesion is 99% stenosed.  Mid LAD-2 lesion is 100% stenosed.   1. The LAD is a large caliber vessel that courses the apex. There is a severe mid stenosis just after the first diagonal branch. The mid vessel is  then occluded beyond the third diagonal branch. The distal LAD fills from left to left collaterals.  2. The Circumflex is a large caliber vessel. There is mild diffuse disease. The second obtuse marginal branch appears to be small in caliber and is chronically occluded, filling from left to left collaterals.  4. The RCA is a large dominant vessel with a long segment of chronic occlusion in the mid vessel. The distal vessel fills from right to right collaterals and left to right collaterals.  5. LVEDP 15  mmHg  Recommendations: He has chronic occlusion of the LAD and the RCA. He will need medical management of his CAD for now with ASA, statin and a beta blocker. I will have our CTO team review the films and give [place] direction on the possibility of PCI of the RCA and LAD. If he is not felt to be a candidate for CTO PCI, then we can consider CABG. He has no chest pain. I will let him go home today. I will route this to Dr. Percival Spanish and further planning for revascularization will be discussed as an outpatient.  Past Medical History:  Diagnosis Date  . Chronic pain of right knee   . Coronary artery disease   . Hypertension   . Phimosis     Past Surgical History:  Procedure Laterality Date  . CIRCUMCISION N/A 10/12/2018   Procedure: CIRCUMCISION ADULT;  Surgeon: Franchot Gallo, MD;  Location: [facility];  Service: Urology;  Laterality: N/A;  45 mins  . COLONOSCOPY    . CORONARY ARTERY BYPASS GRAFT N/A 12/12/2018   Procedure: CORONARY ARTERY BYPASS GRAFTING (CABG) x four, using bilateral internal mammary arteries and right leg greater saphenous vein harvested endoscopically;  Surgeon: [name], MD;  Location: [place];  Service: Open Heart Surgery;  Laterality: N/A;  . LEFT HEART CATH AND CORONARY ANGIOGRAPHY N/A 11/28/2018   Procedure: LEFT HEART CATH AND CORONARY ANGIOGRAPHY;  Surgeon: Burnell Blanks, MD;  Location: [place] [facility];  Service: Cardiovascular;  Laterality: N/A;  . MEDIASTINAL EXPLORATION N/A 12/12/2018   Procedure: Mediastinal Re-Exploration after bypass graft;  Surgeon: [name], MD;  Location: [place];  Service: Open Heart Surgery;  Laterality: N/A;  . TEE WITHOUT CARDIOVERSION N/A 12/12/2018   Procedure: TRANSESOPHAGEAL ECHOCARDIOGRAM (TEE);  Surgeon: [name], MD;  Location: [place];  Service: Open Heart Surgery;  Laterality: N/A;  . TOOTH EXTRACTION      MEDICATIONS: . aspirin EC 81 MG tablet  . atorvastatin (LIPITOR) 40 MG tablet  .  carvedilol (COREG) 3.125 MG tablet  . clopidogrel (PLAVIX) 75 MG tablet  . digoxin (LANOXIN) 0.125 MG tablet  . ferrous Q000111Q C-folic acid (TRINSICON / FOLTRIN) capsule  . furosemide (LASIX) 40 MG tablet  . losartan (COZAAR) 25 MG tablet  . Multiple Vitamins-Iron (MULTIVITAMINS WITH IRON) TABS tablet  . nitroGLYCERIN (NITROSTAT) 0.4 MG SL tablet  . pantoprazole (PROTONIX) 20 MG tablet  . potassium chloride SA (KLOR-CON) 20 MEQ tablet  . promethazine (PHENERGAN) 12.5 MG tablet  . senna-docusate (SENOKOT-S) 8.6-50 MG tablet  . spironolactone (ALDACTONE) 25 MG tablet  . traZODone (DESYREL) 50 MG tablet   No current facility-administered medications for this encounter.    Maia Plan [facility] Pre-Surgical Testing [phone] 03/05/19  4:00 PM

## 2019-03-08 ENCOUNTER — Encounter (HOSPITAL_COMMUNITY)
Admission: RE | Disposition: A | Discharge status: Home / Self Care | Payer: Self-pay | Source: Other Acute Inpatient Hospital | Attending: Orthopaedic Surgery

## 2019-03-08 ENCOUNTER — Ambulatory Visit (HOSPITAL_COMMUNITY): Payer: Self-pay

## 2019-03-08 ENCOUNTER — Ambulatory Visit (HOSPITAL_COMMUNITY): Payer: Self-pay | Admitting: Anesthesiology

## 2019-03-08 ENCOUNTER — Other Ambulatory Visit: Payer: Self-pay

## 2019-03-08 ENCOUNTER — Encounter (HOSPITAL_COMMUNITY): Payer: Self-pay | Admitting: Orthopaedic Surgery

## 2019-03-08 ENCOUNTER — Observation Stay (HOSPITAL_COMMUNITY)
Admission: RE | Admit: 2019-03-08 | Discharge: 2019-03-09 | Disposition: A | Discharge status: Home / Self Care | Payer: Self-pay | Source: Other Acute Inpatient Hospital | Attending: Orthopaedic Surgery | Admitting: Orthopaedic Surgery

## 2019-03-08 ENCOUNTER — Observation Stay (HOSPITAL_COMMUNITY): Payer: Self-pay

## 2019-03-08 ENCOUNTER — Encounter (HOSPITAL_COMMUNITY): Payer: No Typology Code available for payment source | Admitting: Cardiology

## 2019-03-08 ENCOUNTER — Ambulatory Visit (HOSPITAL_COMMUNITY): Payer: Self-pay | Admitting: Physician Assistant

## 2019-03-08 DIAGNOSIS — M25751 Osteophyte, right hip: Secondary | ICD-10-CM | POA: Insufficient documentation

## 2019-03-08 DIAGNOSIS — I5022 Chronic systolic (congestive) heart failure: Secondary | ICD-10-CM | POA: Insufficient documentation

## 2019-03-08 DIAGNOSIS — I2510 Atherosclerotic heart disease of native coronary artery without angina pectoris: Secondary | ICD-10-CM | POA: Insufficient documentation

## 2019-03-08 DIAGNOSIS — I255 Ischemic cardiomyopathy: Secondary | ICD-10-CM | POA: Insufficient documentation

## 2019-03-08 DIAGNOSIS — R2689 Other abnormalities of gait and mobility: Secondary | ICD-10-CM | POA: Insufficient documentation

## 2019-03-08 DIAGNOSIS — Z96641 Presence of right artificial hip joint: Secondary | ICD-10-CM

## 2019-03-08 DIAGNOSIS — Z951 Presence of aortocoronary bypass graft: Secondary | ICD-10-CM | POA: Insufficient documentation

## 2019-03-08 DIAGNOSIS — M8568 Other cyst of bone, other site: Secondary | ICD-10-CM | POA: Insufficient documentation

## 2019-03-08 DIAGNOSIS — M25851 Other specified joint disorders, right hip: Secondary | ICD-10-CM | POA: Insufficient documentation

## 2019-03-08 DIAGNOSIS — M1611 Unilateral primary osteoarthritis, right hip: Principal | ICD-10-CM | POA: Diagnosis present

## 2019-03-08 DIAGNOSIS — I6529 Occlusion and stenosis of unspecified carotid artery: Secondary | ICD-10-CM | POA: Insufficient documentation

## 2019-03-08 DIAGNOSIS — Z419 Encounter for procedure for purposes other than remedying health state, unspecified: Secondary | ICD-10-CM

## 2019-03-08 DIAGNOSIS — I110 Hypertensive heart disease with heart failure: Secondary | ICD-10-CM | POA: Insufficient documentation

## 2019-03-08 HISTORY — PX: TOTAL HIP ARTHROPLASTY: SHX124

## 2019-03-08 LAB — TYPE AND SCREEN
ABO/RH(D): A POS
Antibody Screen: NEGATIVE

## 2019-03-08 LAB — ABO/RH: ABO/RH(D): A POS

## 2019-03-08 SURGERY — ARTHROPLASTY, HIP, TOTAL, ANTERIOR APPROACH
Anesthesia: Spinal | Site: Hip | Laterality: Right

## 2019-03-08 MED ORDER — CLOPIDOGREL BISULFATE 75 MG PO TABS: 75.0000 mg | ORAL_TABLET | Freq: Every day | ORAL | Status: DC

## 2019-03-08 MED ORDER — SPIRONOLACTONE 25 MG PO TABS
25.0000 mg | ORAL_TABLET | Freq: Every day | ORAL | Status: DC
  Administered 2019-03-09: 25 mg via ORAL
  Filled 2019-03-08: qty 1

## 2019-03-08 MED ORDER — POVIDONE-IODINE 10 % EX SWAB
2.0000 "application " | Freq: Once | CUTANEOUS | Status: AC
  Administered 2019-03-08: 2 via TOPICAL

## 2019-03-08 MED ORDER — ONDANSETRON HCL 4 MG/2ML IJ SOLN: 4.0000 mg | Freq: Four times a day (QID) | INTRAMUSCULAR | Status: DC | PRN

## 2019-03-08 MED ORDER — TRANEXAMIC ACID-NACL 1000-0.7 MG/100ML-% IV SOLN
1000.0000 mg | INTRAVENOUS | Status: AC
  Administered 2019-03-08: 09:00:00 1000 mg via INTRAVENOUS
  Filled 2019-03-08: qty 100

## 2019-03-08 MED ORDER — DIPHENHYDRAMINE HCL 12.5 MG/5ML PO ELIX
12.5000 mg | ORAL_SOLUTION | ORAL | Status: DC | PRN
  Administered 2019-03-08: 25 mg via ORAL
  Filled 2019-03-08: qty 10

## 2019-03-08 MED ORDER — DIGOXIN 125 MCG PO TABS
0.1250 mg | ORAL_TABLET | Freq: Every day | ORAL | Status: DC
  Administered 2019-03-09: 0.125 mg via ORAL
  Filled 2019-03-08: qty 1

## 2019-03-08 MED ORDER — PHENYLEPHRINE HCL (PRESSORS) 10 MG/ML IV SOLN
INTRAVENOUS | Status: AC
  Filled 2019-03-08: qty 1

## 2019-03-08 MED ORDER — TAB-A-VITE/IRON PO TABS
1.0000 | ORAL_TABLET | Freq: Every day | ORAL | Status: DC
  Administered 2019-03-09: 1 via ORAL
  Filled 2019-03-08: qty 1

## 2019-03-08 MED ORDER — HYDROMORPHONE HCL 1 MG/ML IJ SOLN
INTRAMUSCULAR | Status: AC
  Filled 2019-03-08: qty 1

## 2019-03-08 MED ORDER — MIDAZOLAM HCL 2 MG/2ML IJ SOLN
INTRAMUSCULAR | Status: AC
  Filled 2019-03-08: qty 2

## 2019-03-08 MED ORDER — MENTHOL 3 MG MT LOZG: 1.0000 | LOZENGE | OROMUCOSAL | Status: DC | PRN

## 2019-03-08 MED ORDER — OXYCODONE HCL 5 MG PO TABS
5.0000 mg | ORAL_TABLET | ORAL | Status: DC | PRN
  Administered 2019-03-08 (×3): 10 mg via ORAL
  Administered 2019-03-09: 5 mg via ORAL
  Administered 2019-03-09: 10 mg via ORAL
  Filled 2019-03-08: qty 1
  Filled 2019-03-08 (×3): qty 2

## 2019-03-08 MED ORDER — POLYETHYLENE GLYCOL 3350 17 G PO PACK: 17.0000 g | PACK | Freq: Every day | ORAL | Status: DC | PRN

## 2019-03-08 MED ORDER — SODIUM CHLORIDE 0.9 % IV SOLN: INTRAVENOUS | Status: DC

## 2019-03-08 MED ORDER — ONDANSETRON HCL 4 MG/2ML IJ SOLN
INTRAMUSCULAR | Status: AC
  Filled 2019-03-08: qty 2

## 2019-03-08 MED ORDER — OXYCODONE HCL 5 MG/5ML PO SOLN: 5.0000 mg | Freq: Once | ORAL | Status: DC | PRN

## 2019-03-08 MED ORDER — OXYCODONE HCL 5 MG PO TABS: 5.0000 mg | ORAL_TABLET | Freq: Once | ORAL | Status: DC | PRN

## 2019-03-08 MED ORDER — CARVEDILOL 3.125 MG PO TABS
3.1250 mg | ORAL_TABLET | Freq: Two times a day (BID) | ORAL | Status: DC
  Administered 2019-03-08 – 2019-03-09 (×2): 3.125 mg via ORAL
  Filled 2019-03-08 (×2): qty 1

## 2019-03-08 MED ORDER — 0.9 % SODIUM CHLORIDE (POUR BTL) OPTIME
TOPICAL | Status: DC | PRN
  Administered 2019-03-08: 09:00:00 1000 mL

## 2019-03-08 MED ORDER — FENTANYL CITRATE (PF) 100 MCG/2ML IJ SOLN
INTRAMUSCULAR | Status: AC
  Filled 2019-03-08: qty 2

## 2019-03-08 MED ORDER — PHENOL 1.4 % MT LIQD
1.0000 | OROMUCOSAL | Status: DC | PRN
  Filled 2019-03-08: qty 177

## 2019-03-08 MED ORDER — MIDAZOLAM HCL 5 MG/5ML IJ SOLN
INTRAMUSCULAR | Status: DC | PRN
  Administered 2019-03-08 (×2): 1 mg via INTRAVENOUS

## 2019-03-08 MED ORDER — PROPOFOL 500 MG/50ML IV EMUL
INTRAVENOUS | Status: AC
  Filled 2019-03-08: qty 50

## 2019-03-08 MED ORDER — PROMETHAZINE HCL 12.5 MG PO TABS
6.2500 mg | ORAL_TABLET | Freq: Three times a day (TID) | ORAL | Status: DC | PRN
  Filled 2019-03-08: qty 1

## 2019-03-08 MED ORDER — METHOCARBAMOL 500 MG PO TABS
500.0000 mg | ORAL_TABLET | Freq: Four times a day (QID) | ORAL | Status: DC | PRN
  Administered 2019-03-08: 500 mg via ORAL
  Filled 2019-03-08: qty 1

## 2019-03-08 MED ORDER — ATORVASTATIN CALCIUM 40 MG PO TABS
40.0000 mg | ORAL_TABLET | Freq: Every day | ORAL | Status: DC
  Administered 2019-03-08: 40 mg via ORAL
  Filled 2019-03-08: qty 1

## 2019-03-08 MED ORDER — SODIUM CHLORIDE 0.9 % IR SOLN
Status: DC | PRN
  Administered 2019-03-08: 1000 mL

## 2019-03-08 MED ORDER — ONDANSETRON HCL 4 MG/2ML IJ SOLN
INTRAMUSCULAR | Status: DC | PRN
  Administered 2019-03-08: 4 mg via INTRAVENOUS

## 2019-03-08 MED ORDER — ASPIRIN EC 81 MG PO TBEC
81.0000 mg | DELAYED_RELEASE_TABLET | Freq: Every day | ORAL | Status: DC
  Administered 2019-03-08: 81 mg via ORAL
  Filled 2019-03-08: qty 1

## 2019-03-08 MED ORDER — PROMETHAZINE HCL 25 MG/ML IJ SOLN: 6.2500 mg | INTRAMUSCULAR | Status: DC | PRN

## 2019-03-08 MED ORDER — METHOCARBAMOL 500 MG IVPB - SIMPLE MED
INTRAVENOUS | Status: AC
  Filled 2019-03-08: qty 50

## 2019-03-08 MED ORDER — PHENYLEPHRINE HCL-NACL 10-0.9 MG/250ML-% IV SOLN
INTRAVENOUS | Status: DC | PRN
  Administered 2019-03-08: 20 ug/min via INTRAVENOUS

## 2019-03-08 MED ORDER — HYDROMORPHONE HCL 1 MG/ML IJ SOLN
0.2500 mg | INTRAMUSCULAR | Status: DC | PRN
  Administered 2019-03-08: 0.5 mg via INTRAVENOUS

## 2019-03-08 MED ORDER — PROPOFOL 500 MG/50ML IV EMUL
INTRAVENOUS | Status: DC | PRN
  Administered 2019-03-08: 75 ug/kg/min via INTRAVENOUS

## 2019-03-08 MED ORDER — PANTOPRAZOLE SODIUM 20 MG PO TBEC
20.0000 mg | DELAYED_RELEASE_TABLET | Freq: Every day | ORAL | Status: DC
  Administered 2019-03-09: 20 mg via ORAL
  Filled 2019-03-08: qty 1

## 2019-03-08 MED ORDER — ACETAMINOPHEN 325 MG PO TABS
325.0000 mg | ORAL_TABLET | Freq: Four times a day (QID) | ORAL | Status: DC | PRN
  Administered 2019-03-09: 650 mg via ORAL
  Filled 2019-03-08: qty 2

## 2019-03-08 MED ORDER — PROPOFOL 1000 MG/100ML IV EMUL
INTRAVENOUS | Status: AC
  Filled 2019-03-08: qty 100

## 2019-03-08 MED ORDER — STERILE WATER FOR IRRIGATION IR SOLN
Status: DC | PRN
  Administered 2019-03-08: 2000 mL

## 2019-03-08 MED ORDER — CHLORHEXIDINE GLUCONATE 4 % EX LIQD: 60.0000 mL | Freq: Once | CUTANEOUS | Status: DC

## 2019-03-08 MED ORDER — CEFAZOLIN SODIUM-DEXTROSE 2-4 GM/100ML-% IV SOLN
2.0000 g | INTRAVENOUS | Status: AC
  Administered 2019-03-08: 09:00:00 2 g via INTRAVENOUS
  Filled 2019-03-08: qty 100

## 2019-03-08 MED ORDER — METOCLOPRAMIDE HCL 5 MG PO TABS: 5.0000 mg | ORAL_TABLET | Freq: Three times a day (TID) | ORAL | Status: DC | PRN

## 2019-03-08 MED ORDER — METHOCARBAMOL 500 MG IVPB - SIMPLE MED
500.0000 mg | Freq: Four times a day (QID) | INTRAVENOUS | Status: DC | PRN
  Administered 2019-03-08: 500 mg via INTRAVENOUS
  Filled 2019-03-08: qty 50

## 2019-03-08 MED ORDER — HYDROMORPHONE HCL 1 MG/ML IJ SOLN: 0.5000 mg | INTRAMUSCULAR | Status: DC | PRN

## 2019-03-08 MED ORDER — BUPIVACAINE IN DEXTROSE 0.75-8.25 % IT SOLN
INTRATHECAL | Status: DC | PRN
  Administered 2019-03-08: 2 mL via INTRATHECAL

## 2019-03-08 MED ORDER — EPHEDRINE 5 MG/ML INJ
INTRAVENOUS | Status: AC
  Filled 2019-03-08: qty 10

## 2019-03-08 MED ORDER — CEFAZOLIN SODIUM-DEXTROSE 1-4 GM/50ML-% IV SOLN
1.0000 g | Freq: Four times a day (QID) | INTRAVENOUS | Status: AC
  Administered 2019-03-08 – 2019-03-09 (×2): 1 g via INTRAVENOUS
  Filled 2019-03-08 (×3): qty 50

## 2019-03-08 MED ORDER — METOCLOPRAMIDE HCL 5 MG/ML IJ SOLN: 5.0000 mg | Freq: Three times a day (TID) | INTRAMUSCULAR | Status: DC | PRN

## 2019-03-08 MED ORDER — ONDANSETRON HCL 4 MG PO TABS: 4.0000 mg | ORAL_TABLET | Freq: Four times a day (QID) | ORAL | Status: DC | PRN

## 2019-03-08 MED ORDER — OXYCODONE HCL 5 MG PO TABS
10.0000 mg | ORAL_TABLET | ORAL | Status: DC | PRN
  Filled 2019-03-08: qty 2

## 2019-03-08 MED ORDER — ALUM & MAG HYDROXIDE-SIMETH 200-200-20 MG/5ML PO SUSP: 30.0000 mL | ORAL | Status: DC | PRN

## 2019-03-08 MED ORDER — LACTATED RINGERS IV SOLN: INTRAVENOUS | Status: DC

## 2019-03-08 MED ORDER — DOCUSATE SODIUM 100 MG PO CAPS
100.0000 mg | ORAL_CAPSULE | Freq: Two times a day (BID) | ORAL | Status: DC
  Administered 2019-03-08 – 2019-03-09 (×2): 100 mg via ORAL
  Filled 2019-03-08 (×2): qty 1

## 2019-03-08 MED ORDER — LOSARTAN POTASSIUM 25 MG PO TABS
12.5000 mg | ORAL_TABLET | Freq: Every day | ORAL | Status: DC
  Administered 2019-03-08: 12.5 mg via ORAL
  Filled 2019-03-08: qty 1

## 2019-03-08 SURGICAL SUPPLY — 42 items
BAG ZIPLOCK 12X15 (MISCELLANEOUS) IMPLANT
BENZOIN TINCTURE PRP APPL 2/3 (GAUZE/BANDAGES/DRESSINGS) IMPLANT
BLADE SAW SGTL 18X1.27X75 (BLADE) ×2 IMPLANT
BLADE SAW SGTL 18X1.27X75MM (BLADE) ×1
CLOSURE WOUND 1/2 X4 (GAUZE/BANDAGES/DRESSINGS)
COVER PERINEAL POST (MISCELLANEOUS) ×3 IMPLANT
COVER SURGICAL LIGHT HANDLE (MISCELLANEOUS) ×3 IMPLANT
COVER WAND RF STERILE (DRAPES) ×3 IMPLANT
CUP SECTOR GRIPTON 58MM (Orthopedic Implant) ×3 IMPLANT
DRAPE STERI IOBAN 125X83 (DRAPES) ×3 IMPLANT
DRAPE U-SHAPE 47X51 STRL (DRAPES) ×6 IMPLANT
DRSG AQUACEL AG ADV 3.5X10 (GAUZE/BANDAGES/DRESSINGS) ×3 IMPLANT
DURAPREP 26ML APPLICATOR (WOUND CARE) ×3 IMPLANT
ELECT BLADE TIP CTD 4 INCH (ELECTRODE) ×3 IMPLANT
ELECT REM PT RETURN 15FT ADLT (MISCELLANEOUS) ×3 IMPLANT
GAUZE XEROFORM 1X8 LF (GAUZE/BANDAGES/DRESSINGS) ×3 IMPLANT
GLOVE BIO SURGEON STRL SZ7.5 (GLOVE) ×3 IMPLANT
GLOVE BIOGEL PI IND STRL 8 (GLOVE) ×2 IMPLANT
GLOVE BIOGEL PI INDICATOR 8 (GLOVE) ×4
GLOVE ECLIPSE 8.0 STRL XLNG CF (GLOVE) ×3 IMPLANT
GOWN STRL REUS W/TWL XL LVL3 (GOWN DISPOSABLE) ×6 IMPLANT
HANDPIECE INTERPULSE COAX TIP (DISPOSABLE) ×2
HEAD M SROM 36MM PLUS 1.5 (Hips) ×1 IMPLANT
HOLDER FOLEY CATH W/STRAP (MISCELLANEOUS) ×3 IMPLANT
KIT TURNOVER KIT A (KITS) IMPLANT
LINER NEUTRAL 36X58 PLUS4 ×3 IMPLANT
PACK ANTERIOR HIP CUSTOM (KITS) ×3 IMPLANT
PENCIL SMOKE EVACUATOR (MISCELLANEOUS) ×3 IMPLANT
SET HNDPC FAN SPRY TIP SCT (DISPOSABLE) ×1 IMPLANT
SROM M HEAD 36MM PLUS 1.5 (Hips) ×3 IMPLANT
STAPLER VISISTAT 35W (STAPLE) ×3 IMPLANT
STEM FEMORAL SZ 5MM STD ACTIS (Stem) ×3 IMPLANT
STRIP CLOSURE SKIN 1/2X4 (GAUZE/BANDAGES/DRESSINGS) IMPLANT
SUT ETHIBOND NAB CT1 #1 30IN (SUTURE) ×3 IMPLANT
SUT ETHILON 2 0 PS N (SUTURE) IMPLANT
SUT MNCRL AB 4-0 PS2 18 (SUTURE) IMPLANT
SUT VIC AB 0 CT1 36 (SUTURE) ×3 IMPLANT
SUT VIC AB 1 CT1 36 (SUTURE) ×3 IMPLANT
SUT VIC AB 2-0 CT1 27 (SUTURE) ×4
SUT VIC AB 2-0 CT1 TAPERPNT 27 (SUTURE) ×2 IMPLANT
TRAY FOLEY MTR SLVR 16FR STAT (SET/KITS/TRAYS/PACK) IMPLANT
YANKAUER SUCT BULB TIP 10FT TU (MISCELLANEOUS) ×3 IMPLANT

## 2019-03-08 NOTE — Evaluation (Signed)
Physical Therapy Evaluation Patient Details Name: Frank Love MRN: [id number] DOB: 1955-04-06 Today's Date: 03/08/2019   History of Present Illness  64 yo male s/p R DA-THA on 03/08/19. PMH includes CAD s/p CABG x4 11/2018, ischemic cardiomyopathy, sHF, OA.  Clinical Impression  Pt presents with mild R hip pain, post-operative R hip weakness, increased time and effort to mobilize, and decreased activity tolerance post-op. Pt to benefit from acute PT to address deficits. Pt ambulated hallway distance with RW and min guard assist, pt with significant antalgic-appearing gait which pt states is due to chronic compensation for R hip dysfunction. Pt educated on ankle pumps (20/hour) to perform this afternoon/evening to increase circulation, to pt's tolerance and limited by pain. PT to progress mobility as tolerated, and will continue to follow acutely.        Follow Up Recommendations Follow surgeon's recommendation for DC plan and follow-up therapies;Supervision for mobility/OOB    Equipment Recommendations  None recommended by PT    Recommendations for Other Services       Precautions / Restrictions Precautions Precautions: Fall Restrictions Weight Bearing Restrictions: No RLE Weight Bearing: Weight bearing as tolerated      Mobility  Bed Mobility Overal bed mobility: Needs Assistance Bed Mobility: Supine to Sit     Supine to sit: Min assist;HOB elevated     General bed mobility comments: min assist for RLE lifting and translation to EOB, increased time and effort.  Transfers Overall transfer level: Needs assistance Equipment used: Rolling walker (2 wheeled) Transfers: Sit to/from Stand Sit to Stand: Min guard;From elevated surface         General transfer comment: Min guard for safety, verbal cuing for hand placement when rising.  Ambulation/Gait Ambulation/Gait assistance: Min guard;+2 safety/equipment(chair follow) Gait Distance (Feet): 120 Feet Assistive  device: Rolling walker (2 wheeled) Gait Pattern/deviations: Step-to pattern;Decreased step length - left;Antalgic;Decreased weight shift to right;Trunk flexed Gait velocity: decr   General Gait Details: min guard for safety, verbal cuing for upright posture, placement in RW, sequencing. Pt with significant antalgic gait.  Stairs            Wheelchair Mobility    Modified Rankin (Stroke Patients Only)       Balance Overall balance assessment: History of Falls;Mild deficits observed, not formally tested                                           Pertinent Vitals/Pain Pain Assessment: 0-10 Pain Score: 1  Pain Location: R hip Pain Descriptors / Indicators: Discomfort;Sore Pain Intervention(s): Limited activity within patient's tolerance;Monitored during session;Repositioned;Premedicated before session;Ice applied    Home Living Family/patient expects to be discharged to:: Private residence Living Arrangements: Spouse/significant other Available Help at Discharge: Family;Available 24 hours/day Type of Home: Apartment Home Access: Stairs to enter Entrance Stairs-Rails: Right Entrance Stairs-Number of Steps: 3+6 Home Layout: One level Home Equipment: Walker - 2 wheels;Cane - single point;Tub bench;Toilet riser      Prior Function Level of Independence: Independent with assistive device(s)         Comments: Pt reports using cane for ambulation PTA, states he used both hands on cane due to hip pain being so bad. History of falls x2     Hand Dominance   Dominant Hand: Right    Extremity/Trunk Assessment   Upper Extremity Assessment Upper Extremity Assessment: Overall WFL for tasks assessed  Lower Extremity Assessment Lower Extremity Assessment: Overall WFL for tasks assessed;RLE deficits/detail RLE Deficits / Details: suspected post-surgical weakness, able to perform hip flexion, knee flexion, knee extension, DF/PF actively RLE Sensation:  WNL    Cervical / Trunk Assessment Cervical / Trunk Assessment: Normal  Communication   Communication: No difficulties  Cognition Arousal/Alertness: Awake/alert Behavior During Therapy: WFL for tasks assessed/performed Overall Cognitive Status: Within Functional Limits for tasks assessed                                        General Comments      Exercises     Assessment/Plan    PT Assessment Patient needs continued PT services  PT Problem List Decreased strength;Decreased mobility;Decreased range of motion;Decreased activity tolerance;Decreased balance;Decreased knowledge of use of DME;Pain       PT Treatment Interventions DME instruction;Therapeutic activities;Gait training;Therapeutic exercise;Patient/family education;Balance training;Stair training;Functional mobility training    PT Goals (Current goals can be found in the Care Plan section)  Acute Rehab PT Goals Patient Stated Goal: go home with wife PT Goal Formulation: With patient Time For Goal Achievement: 03/15/19 Potential to Achieve Goals: Good    Frequency 7X/week   Barriers to discharge        Co-evaluation               AM-PAC PT "6 Clicks" Mobility  Outcome Measure Help needed turning from your back to your side while in a flat bed without using bedrails?: A Little Help needed moving from lying on your back to sitting on the side of a flat bed without using bedrails?: A Little Help needed moving to and from a bed to a chair (including a wheelchair)?: A Little Help needed standing up from a chair using your arms (e.g., wheelchair or bedside chair)?: A Little Help needed to walk in hospital room?: A Little Help needed climbing 3-5 steps with a railing? : A Lot 6 Click Score: 17    End of Session Equipment Utilized During Treatment: Gait belt Activity Tolerance: Patient tolerated treatment well Patient left: in chair;with chair alarm set;with call bell/phone within reach(on  SCD break) Nurse Communication: Mobility status PT Visit Diagnosis: Other abnormalities of gait and mobility (R26.89);Difficulty in walking, not elsewhere classified (R26.2)    Time: 1530-1550 PT Time Calculation (min) (ACUTE ONLY): 20 min   Charges:   PT Evaluation $PT Eval Low Complexity: 1 Low        Akiel Fennell E, PT Acute Rehabilitation Services Pager [phone]  Office [phone]   Deira Shimer D Elonda Husky 03/08/2019, 4:47 PM

## 2019-03-08 NOTE — H&P (Signed)
TOTAL HIP ADMISSION H&P  Patient is admitted for right total hip arthroplasty.  Subjective:  Chief Complaint: right hip pain  HPI: Frank Love, 64 y.o. male, has a history of pain and functional disability in the right hip(s) due to arthritis and patient has failed non-surgical conservative treatments for greater than 12 weeks to include NSAID's and/or analgesics, corticosteriod injections, flexibility and strengthening excercises, supervised PT with diminished ADL's post treatment, use of assistive devices and activity modification.  Onset of symptoms was gradual starting 2 years ago with gradually worsening course since that time.The patient noted no past surgery on the right hip(s).  Patient currently rates pain in the right hip at 10 out of 10 with activity. Patient has night pain, worsening of pain with activity and weight bearing, trendelenberg gait, pain that interfers with activities of daily living and pain with passive range of motion. Patient has evidence of subchondral cysts, subchondral sclerosis, periarticular osteophytes and joint space narrowing by imaging studies. This condition presents safety issues increasing the risk of falls.  There is no current active infection.  Patient Active Problem List   Diagnosis Date Noted  . Primary osteoarthritis of right hip 02/08/2019  . Stenosis of carotid artery 01/02/2019  . Educated about COVID-19 virus infection 01/02/2019  . Postoperative anemia due to acute blood loss 12/31/2018  . Debility 12/21/2018  . 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 Coronary artery disease 12/12/2018  . Coronary artery disease involving native coronary artery of native heart without angina pectoris   . Ischemic cardiomyopathy   . Acute on chronic systolic heart failure ([place]) 10/28/2018  . Preop cardiovascular exam 10/28/2018  . Abnormal EKG 10/28/2018   Past Medical History:  Diagnosis Date  . Chronic pain of right knee   . Coronary artery disease   .  Hypertension   . Phimosis     Past Surgical History:  Procedure Laterality Date  . CIRCUMCISION N/A 10/12/2018   Procedure: CIRCUMCISION ADULT;  Surgeon: Franchot Gallo, MD;  Location: [facility];  Service: Urology;  Laterality: N/A;  45 mins  . COLONOSCOPY    . CORONARY ARTERY BYPASS GRAFT N/A 12/12/2018   Procedure: CORONARY ARTERY BYPASS GRAFTING (CABG) x four, using bilateral internal mammary arteries and right leg greater saphenous vein harvested endoscopically;  Surgeon: [name], MD;  Location: [place];  Service: Open Heart Surgery;  Laterality: N/A;  . LEFT HEART CATH AND CORONARY ANGIOGRAPHY N/A 11/28/2018   Procedure: LEFT HEART CATH AND CORONARY ANGIOGRAPHY;  Surgeon: Burnell Blanks, MD;  Location: [place] [facility];  Service: Cardiovascular;  Laterality: N/A;  . MEDIASTINAL EXPLORATION N/A 12/12/2018   Procedure: Mediastinal Re-Exploration after bypass graft;  Surgeon: [name], MD;  Location: [place];  Service: Open Heart Surgery;  Laterality: N/A;  . TEE WITHOUT CARDIOVERSION N/A 12/12/2018   Procedure: TRANSESOPHAGEAL ECHOCARDIOGRAM (TEE);  Surgeon: [name], MD;  Location: [place];  Service: Open Heart Surgery;  Laterality: N/A;  . TOOTH EXTRACTION      Current Facility-Administered Medications  Medication Dose Route Frequency Provider Last Rate Last Admin  . ceFAZolin (ANCEF) IVPB 2g/100 mL premix  2 g Intravenous On Call to OR Pete Pelt, PA-C      . chlorhexidine (HIBICLENS) 4 % liquid 4 application  60 mL Topical Once Erskine Emery W, PA-C      . lactated ringers infusion   Intravenous Continuous Brennan Bailey, MD      . povidone-iodine 10 %  swab 2 application  2 application Topical Once Pete Pelt, PA-C      . tranexamic acid (CYKLOKAPRON) IVPB 1,000 mg  1,000 mg Intravenous To OR Pete Pelt, PA-C       No Known Allergies  Social History   Tobacco Use  . Smoking status: Never Smoker  . Smokeless tobacco: Never Used   Substance Use Topics  . Alcohol use: No    Alcohol/week: 0.0 standard drinks    Family History  Problem Relation Age of Onset  . Diabetes Mother   . Heart disease Mother      Review of Systems  Objective:  Physical Exam  Constitutional: He is oriented to person, place, and time. He appears well-developed and well-nourished.  HENT:  Head: Normocephalic and atraumatic.  Eyes: Pupils are equal, round, and reactive to light. EOM are normal.  Cardiovascular: Normal rate.  Respiratory: Effort normal.  GI: Soft.  Musculoskeletal:     Cervical back: Normal range of motion and neck supple.  Neurological: He is alert and oriented to person, place, and time.  Skin: Skin is warm and dry.  Psychiatric: He has a normal mood and affect.    Vital signs in last 24 hours: Weight:  [101.4 kg] 101.4 kg (02/12 0700)  Labs:   Estimated body mass index is 27.94 kg/m as calculated from the following:   Height as of this encounter: 6\' 3"  (1.905 m).   Weight as of this encounter: 101.4 kg.   Imaging Review Plain radiographs demonstrate severe degenerative joint disease of the right hip(s). The bone quality appears to be good for age and reported activity level.      Assessment/Plan:  End stage arthritis, right hip(s)  The patient history, physical examination, clinical judgement of the provider and imaging studies are consistent with end stage degenerative joint disease of the right hip(s) and total hip arthroplasty is deemed medically necessary. The treatment options including medical management, injection therapy, arthroscopy and arthroplasty were discussed at length. The risks and benefits of total hip arthroplasty were presented and reviewed. The risks due to aseptic loosening, infection, stiffness, dislocation/subluxation,  thromboembolic complications and other imponderables were discussed.  The patient acknowledged the explanation, agreed to proceed with the plan and consent was  signed. Patient is being admitted for inpatient treatment for surgery, pain control, PT, OT, prophylactic antibiotics, VTE prophylaxis, progressive ambulation and ADL's and discharge planning.The patient is planning to be discharged home with home health services

## 2019-03-08 NOTE — Plan of Care (Signed)
  Problem: Education: Goal: Knowledge of General Education information will improve Description: Including pain rating scale, medication(s)/side effects and non-pharmacologic comfort measures Outcome: Progressing   Problem: Health Behavior/Discharge Planning: Goal: Ability to manage health-related needs will improve Outcome: Progressing   Problem: Clinical Measurements: Goal: Ability to maintain clinical measurements within normal limits will improve Outcome: Progressing Goal: Will remain free from infection Outcome: Progressing Goal: Diagnostic test results will improve Outcome: Progressing Goal: Respiratory complications will improve Outcome: Progressing Goal: Cardiovascular complication will be avoided Outcome: Progressing   Problem: Clinical Measurements: Goal: Ability to maintain clinical measurements within normal limits will improve Outcome: Progressing Goal: Will remain free from infection Outcome: Progressing Goal: Diagnostic test results will improve Outcome: Progressing Goal: Respiratory complications will improve Outcome: Progressing Goal: Cardiovascular complication will be avoided Outcome: Progressing   Problem: Activity: Goal: Risk for activity intolerance will decrease Outcome: Progressing   Problem: Nutrition: Goal: Adequate nutrition will be maintained Outcome: Progressing   Problem: Coping: Goal: Level of anxiety will decrease Outcome: Progressing   Problem: Elimination: Goal: Will not experience complications related to bowel motility Outcome: Progressing Goal: Will not experience complications related to urinary retention Outcome: Progressing   Problem: Pain Managment: Goal: General experience of comfort will improve Outcome: Progressing   Problem: Safety: Goal: Ability to remain free from injury will improve Outcome: Progressing   Problem: Skin Integrity: Goal: Risk for impaired skin integrity will decrease Outcome: Progressing    Problem: Education: Goal: Knowledge of the prescribed therapeutic regimen will improve Outcome: Progressing Goal: Understanding of discharge needs will improve Outcome: Progressing Goal: Individualized Educational Video(s) Outcome: Progressing   Problem: Activity: Goal: Ability to avoid complications of mobility impairment will improve Outcome: Progressing Goal: Ability to tolerate increased activity will improve Outcome: Progressing   Problem: Clinical Measurements: Goal: Postoperative complications will be avoided or minimized Outcome: Progressing   Problem: Pain Management: Goal: Pain level will decrease with appropriate interventions Outcome: Progressing   Problem: Skin Integrity: Goal: Will show signs of wound healing Outcome: Progressing

## 2019-03-08 NOTE — Anesthesia Procedure Notes (Signed)
Procedure Name: MAC Date/Time: 03/08/2019 8:31 AM Performed by: Maxwell Caul, CRNA Pre-anesthesia Checklist: Patient identified, Emergency Drugs available, Suction available and Patient being monitored Oxygen Delivery Method: Simple face mask

## 2019-03-08 NOTE — Op Note (Signed)
NAME: Frank Love, KISSLER MEDICAL RECORD [id number] ACCOUNT [id number] DATE OF BIRTH:01/12/1956 FACILITY: [facility] LOCATION: [facility] PHYSICIAN:Ferman Basilio Kerry [place], MD  OPERATIVE REPORT  DATE OF PROCEDURE:  03/08/2019  PREOPERATIVE DIAGNOSIS:  Primary osteoarthritis and degenerative joint disease, right hip.  POSTOPERATIVE DIAGNOSIS:  Primary osteoarthritis and degenerative joint disease, right hip.  PROCEDURE:  Right total hip arthroplasty through direct anterior approach.  IMPLANTS:  DePuy Sector Gription acetabular component size 58, size 36+4 neutral polyethylene liner, size 5 Actis femoral component with standard offset, size 36+1.5 metal hip ball.  SURGEON:  Lind Guest.  Ninfa Linden, MD  ASSISTANT:  Erskine Emery, PA-C  ANESTHESIA:  Spinal.  ANTIBIOTICS:  Two g IV Ancef.  ESTIMATED BLOOD LOSS:  200-250 mL.  COMPLICATIONS:  None.  INDICATIONS:  The patient is a 64 year old gentleman with significant heart disease who also has debilitating arthritis involving his right hip.  His pain is daily and it is 10/10.  His right hip pain is now detrimentally affecting his mobility, his  quality of life and his activities of daily living to the point that he does wish to proceed with total hip arthroplasty.  He has tried and failed all forms of conservative treatment.  We did obtain cardiac clearance for the surgery.  We talked in detail  about the perioperative risks including the risk of acute blood loss anemia, nerve or vessel injury, fracture, infection, dislocation, DVT and implant failure.  We talked about our goals being decreased pain, improved mobility and overall improve  quality of life.  DESCRIPTION OF PROCEDURE:  After informed consent was obtained and appropriate right hip was marked, he was brought to the operating room and sat up on a stretcher where spinal anesthesia was obtained.  He was then laid supine on the stretcher and  traction boots were placed on both his  feet.  A Foley catheter was placed as well.  Next, he was placed supine on the Hana fracture table, the perineal post in place and both legs in line skeletal traction device and no traction applied.  His right  operative hip was assessed radiographically.  We then prepped and draped with DuraPrep and sterile drapes.  A timeout was called to identify correct patient, correct right hip.  We then made an incision just inferior and posterior to the anterior  superior iliac spine and carried this obliquely down the leg.  We dissected down to tensor fascia lata muscle.  Tensor fascia was then divided longitudinally to proceed with direct anterior approach to the hip.  We identified and cauterized circumflex  vessels and identified the hip capsule, opened the hip capsule in an L-type format, finding moderate joint effusion and significant periarticular osteophytes around the femoral head and neck.  We then placed curved retractors around the medial and  lateral femoral neck.  I made my femoral neck cut just proximal to the lesser trochanter with an oscillating saw and completed this with osteotome.  We placed a corkscrew guide in the femoral head and removed the femoral head in its entirety and found a  wide area devoid of cartilage and significant disease all around his right hip.  I then placed a bent Hohmann over the medial acetabular rim.  I then removed remnants of the acetabular labrum and other debris.  I then began reaming under direct  visualization from a size 44 reamer in stepwise increments up to a size 57.  With all reamers placed under direct visualization, the last reamer was also placed under direct  fluoroscopy, so I could obtain my depth of reaming, my inclination and  anteversion.  I then placed the real DePuy Sector Gription acetabular component size 58 and a 36+4 neutral polyethylene liner given his high offset.  We then turned attention to the femur.  With the leg externally rotated to 120  degrees, extended and  adducted, we are to place a Mueller retractor medially and a Hohman retractor behind the greater trochanter.  We released the lateral joint capsule and used a box-cutting osteotome to enter the femoral canal and a rongeur to lateralize.  We then began  broaching using the Actis broaching system from a size zero up to a size 5.  With the size 5 in place, we did place a high offset femoral neck and a 36+1.5 hip ball.  We brought the leg back over and up and before we reduced the hip,  I felt like his leg  lengths were going to be significantly long, which surprisingly preoperatively, he is longer on this side.  I did feel we could not reduce the hip without offset, so we trialed a standard offset neck and with a 36+1.5 hip ball, reduced this in the  acetabulum and it was nice and tight.  I did feel like we kept him at the same leg length and improved his offset.  It was stable on clinical exam and under fluoroscopy.  We then dislocated the hip and removed the trial components.  We placed the real  standard offset Actis femoral component size 5 and the real 36+1.5 metal hip ball, again reduced this in the acetabulum.  We were pleased with stability.  We then irrigated the soft tissue with normal saline solution using pulsatile lavage.  We closed  the joint capsule with interrupted #1 Ethibond suture.  A #1 Vicryl was used to close the tensor fascia.  Zero Vicryl was used to close deep tissue and 2-0 Vicryl was used to close subcutaneous tissue.  The skin was closed with staples.  Xeroform and  Aquacel dressing was applied.  He was taken off the Hana table and taken to the recovery room in stable condition.  All final counts were correct.  There were no complications noted.  Of note, Benita Stabile, PA-C, assisted during the entire case and his  assistance was crucial for facilitating all aspects of this case.  VN/NUANCE  D:03/08/2019 T:03/08/2019 JOB:[id number]

## 2019-03-08 NOTE — Anesthesia Postprocedure Evaluation (Signed)
Anesthesia Post Note  Patient: Frank Love  Procedure(s) Performed: RIGHT TOTAL HIP ARTHROPLASTY ANTERIOR APPROACH (Right Hip)     Patient location during evaluation: PACU Anesthesia Type: Spinal Level of consciousness: awake and alert and oriented Pain management: pain level controlled Vital Signs Assessment: post-procedure vital signs reviewed and stable Respiratory status: spontaneous breathing, nonlabored ventilation and respiratory function stable Cardiovascular status: blood pressure returned to baseline Postop Assessment: no apparent nausea or vomiting Anesthetic complications: no    Last Vitals:  Vitals:   03/08/19 1115 03/08/19 1130  BP: 126/65 135/77  Pulse: (!) 50 (!) 52  Resp: 12 13  Temp:  36.6 C  SpO2: 100% 100%    Last Pain:  Vitals:   03/08/19 1130  TempSrc:   PainSc: [place]

## 2019-03-08 NOTE — Transfer of Care (Signed)
Immediate Anesthesia Transfer of Care Note  Patient: Frank Love  Procedure(s) Performed: RIGHT TOTAL HIP ARTHROPLASTY ANTERIOR APPROACH (Right Hip)  Patient Location: PACU  Anesthesia Type:Spinal  Level of Consciousness: awake, alert  and oriented  Airway & Oxygen Therapy: Patient Spontanous Breathing  Post-op Assessment: Report given to RN and Post -op Vital signs reviewed and stable  Post vital signs: Reviewed and stable  Last Vitals:  Vitals Value Taken Time  BP 118/67 03/08/19 1032  Temp    Pulse 57 03/08/19 1034  Resp 14 03/08/19 1034  SpO2 100 % 03/08/19 1034  Vitals shown include unvalidated device data.  Last Pain:  Vitals:   03/08/19 0730  TempSrc:   PainSc: 0-No pain         Complications: No apparent anesthesia complications

## 2019-03-08 NOTE — Brief Op Note (Signed)
03/08/2019  10:03 AM  PATIENT:  Frank Love  64 y.o. male  PRE-OPERATIVE DIAGNOSIS:  right hip osteoarthritis  POST-OPERATIVE DIAGNOSIS:  right hip osteoarthritis  PROCEDURE:  Procedure(s): RIGHT TOTAL HIP ARTHROPLASTY ANTERIOR APPROACH (Right)  SURGEON:  Surgeon(s) and Role:    Mcarthur Rossetti, MD - Primary  PHYSICIAN ASSISTANT:  Benita Stabile, PA-C  ANESTHESIA:   spinal  EBL:  200 mL   COUNTS:  YES   DICTATION: .Other Dictation: Dictation Number [id number]  PLAN OF CARE: Admit for overnight observation  PATIENT DISPOSITION:  PACU - hemodynamically stable.   Delay start of Pharmacological VTE agent (>24hrs) due to surgical blood loss or risk of bleeding: no

## 2019-03-08 NOTE — Anesthesia Procedure Notes (Signed)
Spinal  Patient location during procedure: OR Start time: 03/08/2019 8:35 AM End time: 03/08/2019 8:38 AM Staffing Performed: anesthesiologist  Anesthesiologist: Brennan Bailey, MD Preanesthetic Checklist Completed: patient identified, IV checked, risks and benefits discussed, surgical consent, monitors and equipment checked, pre-op evaluation and timeout performed Spinal Block Patient position: sitting Prep: DuraPrep and site prepped and draped Patient monitoring: continuous pulse ox, blood pressure and heart rate Approach: midline Location: L3-4 Injection technique: single-shot Needle Needle type: Pencan  Needle gauge: 24 G Needle length: 9 cm Additional Notes Risks, benefits, and alternative discussed. Patient gave consent to procedure. Prepped and draped in sitting position. Patient sedated but responsive to voice. Clear CSF obtained after one needle pass. Positive terminal aspiration. No pain or paraesthesias with injection. Patient tolerated procedure well. Vital signs stable. Tawny Asal, MD

## 2019-03-08 NOTE — Anesthesia Procedure Notes (Addendum)
Arterial Line Insertion Start/End2/12/2019 8:00 AM, 03/08/2019 8:10 AM Performed by: Maxwell Caul, CRNA, CRNA  Patient location: Pre-op. Preanesthetic checklist: patient identified, IV checked, site marked, risks and benefits discussed, surgical consent, monitors and equipment checked, pre-op evaluation, timeout performed and anesthesia consent Lidocaine 1% used for infiltration Right, radial was placed Catheter size: 20 Fr Hand hygiene performed  and maximum sterile barriers used   Attempts: 1 Procedure performed without using ultrasound guided technique. Following insertion, dressing applied. Post procedure assessment: normal and unchanged  Additional procedure comments: Time out in Short Stay room #2. One stick, patient tolerated procedure well. Marland Kitchen

## 2019-03-09 LAB — BASIC METABOLIC PANEL
Anion gap: 4 — ABNORMAL LOW (ref 5–15)
BUN: 25 mg/dL — ABNORMAL HIGH (ref 8–23)
CO2: 29 mmol/L (ref 22–32)
Calcium: 8.1 mg/dL — ABNORMAL LOW (ref 8.9–10.3)
Chloride: 105 mmol/L (ref 98–111)
Creatinine, Ser: 1.07 mg/dL (ref 0.61–1.24)
GFR calc Af Amer: >60 mL/min (ref >60)
GFR calc non Af Amer: >60 mL/min (ref >60)
Glucose, Bld: 115 mg/dL — ABNORMAL HIGH (ref 70–99)
Potassium: 4.7 mmol/L (ref 3.5–5.1)
Sodium: 138 mmol/L (ref 135–145)

## 2019-03-09 LAB — CBC
HCT: 34.1 % — ABNORMAL LOW (ref 39.0–52.0)
Hemoglobin: 10.6 g/dL — ABNORMAL LOW (ref 13.0–17.0)
MCH: 28.6 pg (ref 26.0–34.0)
MCHC: 31.1 g/dL (ref 30.0–36.0)
MCV: 91.9 fL (ref 80.0–100.0)
Platelets: 122 10*3/uL — ABNORMAL LOW (ref 150–400)
RBC: 3.71 MIL/uL — ABNORMAL LOW (ref 4.22–5.81)
RDW: 13.8 % (ref 11.5–15.5)
WBC: 7.6 10*3/uL (ref 4.0–10.5)
nRBC: 0 % (ref 0.0–0.2)

## 2019-03-09 MED ORDER — OXYCODONE HCL 5 MG PO TABS: 5.0000 mg | ORAL_TABLET | ORAL | 0 refills | Status: DC | PRN

## 2019-03-09 MED ORDER — METHOCARBAMOL 500 MG PO TABS: 500.0000 mg | ORAL_TABLET | Freq: Four times a day (QID) | ORAL | 0 refills | Status: DC

## 2019-03-09 MED ORDER — METHOCARBAMOL 500 MG PO TABS: 500.0000 mg | ORAL_TABLET | Freq: Four times a day (QID) | ORAL | 0 refills | Status: DC | PRN

## 2019-03-09 MED ORDER — OXYCODONE HCL 5 MG PO TABS: ORAL_TABLET | ORAL | 0 refills | Status: DC

## 2019-03-09 NOTE — Progress Notes (Signed)
   Subjective: 1 Day Post-Op Procedure(s) (LRB): RIGHT TOTAL HIP ARTHROPLASTY ANTERIOR APPROACH (Right) Patient reports pain as 3 on 0-10 scale and 4 on 0-10 scale.    Objective: Vital signs in last 24 hours: Temp:  [97.5 F (36.4 C)-100.5 F (38.1 C)] 100.5 F (38.1 C) (02/13 0803) Pulse Rate:  [50-84] 74 (02/13 0803) Resp:  [12-18] 18 (02/13 0803) BP: (97-135)/(47-77) 108/51 (02/13 0803) SpO2:  [95 %-100 %] 95 % (02/13 0803) Arterial Line BP: (150-153)/(51-56) 153/52 (02/12 1115)  Intake/Output from previous day: 02/12 0701 - 02/13 0700 In: 1511.3 [I.V.:1211.3; IV Piggyback:300] Out: 1925 [Urine:1725; Blood:200] Intake/Output this shift: No intake/output data recorded.  Recent Labs    03/09/19 0346  HGB 10.6*   Recent Labs    03/09/19 0346  WBC 7.6  RBC 3.71*  HCT 34.1*  PLT 122*   Recent Labs    03/09/19 0346  NA 138  K 4.7  CL 105  CO2 29  BUN 25*  CREATININE 1.07  GLUCOSE 115*  CALCIUM 8.1*   No results for input(s): LABPT, INR in the last 72 hours.  Neurologically intact DG Pelvis Portable  Result Date: 03/08/2019 CLINICAL DATA:  Status post right hip replacement EXAM: PORTABLE PELVIS 1-2 VIEWS COMPARISON:  Intraop film from earlier in the same day. FINDINGS: Right hip prosthesis is noted in satisfactory position. No acute bony or soft tissue abnormality is seen. IMPRESSION: Status post right hip replacement. Electronically Signed   By: Inez Catalina M.D.   On: 03/08/2019 11:12   DG C-Arm 1-60 Min-No Report  Result Date: 03/08/2019 Fluoroscopy was utilized by the requesting physician.  No radiographic interpretation.   DG HIP OPERATIVE UNILAT W OR W/O PELVIS RIGHT  Result Date: 03/08/2019 CLINICAL DATA:  Right hip arthroplasty EXAM: OPERATIVE RIGHT HIP (WITH PELVIS IF PERFORMED) 2 VIEWS TECHNIQUE: Fluoroscopic spot image(s) were submitted for interpretation post-operatively. COMPARISON:  None. FINDINGS: Multiple intraoperative fluoroscopic spot  images are provided. Interval right total hip arthroplasty. FLUOROSCOPY TIME:  18 seconds IMPRESSION: Intraoperative localization. Electronically Signed   By: Kathreen Devoid   On: 03/08/2019 10:18    Assessment/Plan: 1 Day Post-Op Procedure(s) (LRB): RIGHT TOTAL HIP ARTHROPLASTY ANTERIOR APPROACH (Right) Up with therapy , home today after therapy  Frank Love 03/09/2019, 9:45 AM

## 2019-03-09 NOTE — TOC Progression Note (Signed)
Transition of Care [facility]) - Progression Note    Patient Details  Name: Frank Love MRN: [id number] Date of Birth: 1955/10/08  [facility] CM/SW Contact  Joaquin Courts, RN Phone Number: 03/09/2019, 11:49 AM  Clinical Narrative:    CM spoke with patient regarding HH/DME needs.  Patient is uninsured and reporta that he applied for an assistance program through the hospital that covers the cost of his care. Patient reports that he has previously used outpatient PT services and plans to use outpatient PT services again.  CM spoke with on-call MD Dr Lorin Mercy regarding this. Per Dr Lorin Mercy, patient does not need HHPT and can complete home exercises if needed.  CM encouraged patient to reach out to MD office and requests outpatient PT appointment if he wants this or feels will benefit more from this verus home exercises.  Patient reports has all DME at home.     Expected Discharge Plan: [place] Barriers to Discharge: No Barriers Identified  Expected Discharge Plan and Services Expected Discharge Plan: [place]   Discharge Planning Services: CM Consult Post Acute Care Choice: [place] arrangements for the past 2 months: Single Family Home Expected Discharge Date: 03/09/19               DME Arranged: N/A DME Agency: NA       HH Arranged: NA(patient uninsured and cannot afford out of pocket costs, states will go to outpatient PT instead)           Social Determinants of Health (SDOH) Interventions    Readmission Risk Interventions Readmission Risk Prevention Plan 12/21/2018  Transportation Screening (No Data)  Some recent data might be hidden

## 2019-03-09 NOTE — Progress Notes (Signed)
Physical Therapy Treatment Patient Details Name: Frank Love MRN: [id number] DOB: 1955-08-20 Today's Date: 03/09/2019    History of Present Illness 64 yo male s/p R DA-THA on 03/08/19. 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    PT Comments    Progressing with mobility.  Per RN, BP was soft this a.m. Pt c/o mild lightheadedness during session. BP 113/51 after ambulation.  Will plan to have a 2nd session today prior to d/c home later today if all goes well.   Follow Up Recommendations  Follow surgeon's recommendation for DC plan and follow-up therapies;Supervision for mobility/OOB     Equipment Recommendations  None recommended by PT    Recommendations for Other Services       Precautions / Restrictions      Mobility  Bed Mobility Overal bed mobility: Needs Assistance Bed Mobility: Supine to Sit     Supine to sit: Min guard;HOB elevated     General bed mobility comments: Increased time. Pt was able to get to EOB with very little assist given by therapist  Transfers Overall transfer level: Needs assistance Equipment used: Rolling walker (2 wheeled) Transfers: Sit to/from Stand Sit to Stand: Min guard;From elevated surface         General transfer comment: Min guard for safety, verbal cuing for hand placement when rising.  Ambulation/Gait Ambulation/Gait assistance: Min guard Gait Distance (Feet): 120 Feet Assistive device: Rolling walker (2 wheeled) Gait Pattern/deviations: Step-to pattern;Wide base of support;Trunk flexed;Decreased step length - right;Decreased step length - left     General Gait Details: Cues for posture, distance from RW, proper positioning from RW. Close guard for safety. Gait is antalgic.   Stairs             Wheelchair Mobility    Modified Rankin (Stroke Patients Only)       Balance Overall balance assessment: History of Falls;Needs assistance         Standing balance support:  Bilateral upper extremity supported Standing balance-Leahy Scale: Fair                              Cognition Arousal/Alertness: Awake/alert Behavior During Therapy: WFL for tasks assessed/performed Overall Cognitive Status: Within Functional Limits for tasks assessed                                        Exercises Total Joint Exercises Ankle Circles/Pumps: AROM;Both;10 reps;Supine Quad Sets: AROM;Both;10 reps;Supine Heel Slides: AAROM;Right;10 reps;Supine Hip ABduction/ADduction: AAROM;Right;10 reps;Supine    General Comments        Pertinent Vitals/Pain Pain Assessment: 0-10 Pain Score: 2  Pain Location: R hip Pain Descriptors / Indicators: Discomfort;Sore Pain Intervention(s): Monitored during session;Repositioned;Ice applied    Home Living                      Prior Function            PT Goals (current goals can now be found in the care plan section) Progress towards PT goals: Progressing toward goals    Frequency    7X/week      PT Plan Current plan remains appropriate    Co-evaluation              AM-PAC PT "6 Clicks" Mobility   Outcome Measure  Help needed turning  from your back to your side while in a flat bed without using bedrails?: A Little Help needed moving from lying on your back to sitting on the side of a flat bed without using bedrails?: A Little Help needed moving to and from a bed to a chair (including a wheelchair)?: A Little Help needed standing up from a chair using your arms (e.g., wheelchair or bedside chair)?: A Little Help needed to walk in hospital room?: A Little Help needed climbing 3-5 steps with a railing? : A Little 6 Click Score: 18    End of Session Equipment Utilized During Treatment: Gait belt Activity Tolerance: Patient tolerated treatment well Patient left: in chair;with call bell/phone within reach   PT Visit Diagnosis: Other abnormalities of gait and mobility  (R26.89);Difficulty in walking, not elsewhere classified (R26.2)     Time: [id number] PT Time Calculation (min) (ACUTE ONLY): 25 min  Charges:  $Gait Training: 8-22 mins $Therapeutic Exercise: 8-22 mins                         Doreatha Massed, PT Acute Rehabilitation

## 2019-03-09 NOTE — Progress Notes (Signed)
Pharmacy meds sent to is closed on weekend. [facility] does not have narcotics in stock I believe even on weekdays. Sent to [facility] at patients request. RN to print new AVS

## 2019-03-09 NOTE — Plan of Care (Signed)

## 2019-03-09 NOTE — Discharge Instructions (Signed)

## 2019-03-09 NOTE — Progress Notes (Signed)
Physical Therapy Treatment Patient Details Name: Frank Love MRN: [id number] DOB: 11-19-1955 Today's Date: 03/09/2019    History of Present Illness 64 yo male s/p R DA-THA on 03/08/19. 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    PT Comments    2nd session to practice stair training and to continue gait training. Pt tolerated session well. He continues to require cues for proper use of RW. Issued HEP for pt to perform at least 2x/day. All education completed. Okay to d/c from PT standpoint.     Follow Up Recommendations  Follow surgeon's recommendation for DC plan and follow-up therapies;Supervision for mobility/OOB     Equipment Recommendations  None recommended by PT    Recommendations for Other Services       Precautions / Restrictions Precautions Precautions: Fall Restrictions Weight Bearing Restrictions: No RLE Weight Bearing: Weight bearing as tolerated    Mobility  Bed Mobility Overal bed mobility: Needs Assistance Bed Mobility: Supine to Sit          General bed mobility comments: oob in recliner  Transfers Overall transfer level: Needs assistance Equipment used: Rolling walker (2 wheeled) Transfers: Sit to/from Stand Sit to Stand: Min guard         General transfer comment: Min guard for safety, verbal cuing for hand placement when rising.  Ambulation/Gait Ambulation/Gait assistance: Min guard Gait Distance (Feet): 100 Feet Assistive device: Rolling walker (2 wheeled) Gait Pattern/deviations: Step-to pattern;Wide base of support;Trunk flexed;Decreased step length - right;Decreased step length - left     General Gait Details: Cues for posture, distance from RW, proper positioning from RW. Close guard for safety. Gait is antalgic.   Stairs Stairs: Yes Stairs assistance: Min guard Stair Management: Step to pattern;Sideways;One rail Right Number of Stairs: 2 General stair comments: Up and over portable stairs x  1. VCs safety, technique, sequence.   Wheelchair Mobility    Modified Rankin (Stroke Patients Only)       Balance Overall balance assessment: Needs assistance         Standing balance support: Bilateral upper extremity supported Standing balance-Leahy Scale: Fair                              Cognition Arousal/Alertness: Awake/alert Behavior During Therapy: WFL for tasks assessed/performed Overall Cognitive Status: Within Functional Limits for tasks assessed                                        Exercises Total Joint Exercises Ankle Circles/Pumps: AROM;Both;10 reps;Supine Quad Sets: AROM;Both;10 reps;Supine Heel Slides: AAROM;Right;10 reps;Supine Hip ABduction/ADduction: AAROM;Right;10 reps;Supine    General Comments        Pertinent Vitals/Pain Pain Assessment: 0-10 Pain Score: 2  Pain Location: R hip Pain Descriptors / Indicators: Discomfort;Sore Pain Intervention(s): Monitored during session    Home Living                      Prior Function            PT Goals (current goals can now be found in the care plan section) Progress towards PT goals: Progressing toward goals    Frequency    7X/week      PT Plan Current plan remains appropriate    Co-evaluation  AM-PAC PT "6 Clicks" Mobility   Outcome Measure  Help needed turning from your back to your side while in a flat bed without using bedrails?: A Little Help needed moving from lying on your back to sitting on the side of a flat bed without using bedrails?: A Little Help needed moving to and from a bed to a chair (including a wheelchair)?: A Little Help needed standing up from a chair using your arms (e.g., wheelchair or bedside chair)?: A Little Help needed to walk in hospital room?: A Little Help needed climbing 3-5 steps with a railing? : A Little 6 Click Score: 18    End of Session Equipment Utilized During Treatment: Gait  belt Activity Tolerance: Patient tolerated treatment well Patient left: in chair;with call bell/phone within reach   PT Visit Diagnosis: Other abnormalities of gait and mobility (R26.89);Difficulty in walking, not elsewhere classified (R26.2)     Time: 1350-1400 PT Time Calculation (min) (ACUTE ONLY): 10 min  Charges:  $Gait Training: 8-22 mins $Therapeutic Exercise: 8-22 mins                         Doreatha Massed, PT Acute Rehabilitation

## 2019-03-11 ENCOUNTER — Telehealth: Payer: Self-pay | Admitting: Orthopaedic Surgery

## 2019-03-11 ENCOUNTER — Other Ambulatory Visit: Payer: Self-pay | Admitting: Orthopaedic Surgery

## 2019-03-11 ENCOUNTER — Encounter: Payer: Self-pay | Admitting: *Deleted

## 2019-03-11 ENCOUNTER — Telehealth: Payer: Self-pay

## 2019-03-11 MED ORDER — OXYCODONE HCL 5 MG PO TABS: ORAL_TABLET | ORAL | 0 refills | Status: DC

## 2019-03-11 MED FILL — METHOCARBAMOL 500 MG TABS: 500 | 15 days supply | Qty: 60 | Fill #0

## 2019-03-11 NOTE — Telephone Encounter (Signed)
Patient aware of the below message  

## 2019-03-11 NOTE — Telephone Encounter (Signed)
They put the second on hold

## 2019-03-11 NOTE — Telephone Encounter (Signed)
Hello this is Air traffic controller from [employer]. I apologize for the message, but this is the only way that I can notify you because when we call the hospital they never know who to transfer [place] to and the patient isn't answering their phone...but patient Frank Love 08/20/2055 was sent Oxycodone 5 mg to our pharmacy. We do not fill any CII medications. This will need to be sent to another pharmacy. I am deleting the RX on our end. If you have any questions you can call me at [phone]. Thank you.

## 2019-03-11 NOTE — Telephone Encounter (Signed)
If he is not on Plavix and that we will stop by other physicians, I just need him to take an 81 mg aspirin twice daily.

## 2019-03-11 NOTE — Telephone Encounter (Signed)
Frank Love with [facility] pharmacy called in said she received a prescription request for Oxycodone and wanted to let Dr.Balckman know she just filled on for the pt on 03-09-2019 for a 7 day supply and she needs to know if he would like this one to be filled also? If so she needs a calls back to correct directions for the prescription.   [phone]

## 2019-03-11 NOTE — Discharge Summary (Signed)
Patient ID: Frank Love MRN: [id number] DOB/AGE: Dec 15, 1955 64 y.o.  Admit date: 03/08/2019 Discharge date: 03/11/2019  Admission Diagnoses:  Principal Problem:   Primary osteoarthritis of right hip Active Problems:   Status post total replacement of right hip   Discharge Diagnoses:  Same  Past Medical History:  Diagnosis Date  . Chronic pain of right knee   . Coronary artery disease   . Hypertension   . Phimosis     Surgeries: Procedure(s): RIGHT TOTAL HIP ARTHROPLASTY ANTERIOR APPROACH on 03/08/2019   Consultants:   Discharged Condition: Improved  Hospital Course: Frank Love is an 64 y.o. male who was admitted 03/08/2019 for operative treatment ofPrimary osteoarthritis of right hip. Patient has severe unremitting pain that affects sleep, daily activities, and work/hobbies. After pre-op clearance the patient was taken to the operating room on 03/08/2019 and underwent  Procedure(s): RIGHT TOTAL HIP ARTHROPLASTY ANTERIOR APPROACH.    Patient was given perioperative antibiotics:  Anti-infectives (From admission, onward)   Start     Dose/Rate Route Frequency Ordered Stop   03/08/19 1500  ceFAZolin (ANCEF) IVPB 1 g/50 mL premix     1 g 100 mL/hr over 30 Minutes Intravenous Every 6 hours 03/08/19 1202 03/09/19 0101   03/08/19 0715  ceFAZolin (ANCEF) IVPB 2g/100 mL premix     2 g 200 mL/hr over 30 Minutes Intravenous On call to O.R. 03/08/19 0700 03/08/19 0854       Patient was given sequential compression devices, early ambulation, and chemoprophylaxis to prevent DVT.  Patient benefited maximally from hospital stay and there were no complications.    Recent vital signs: No data found.   Recent laboratory studies:  Recent Labs    03/09/19 0346  WBC 7.6  HGB 10.6*  HCT 34.1*  PLT 122*  NA 138  K 4.7  CL 105  CO2 29  BUN 25*  CREATININE 1.07  GLUCOSE 115*  CALCIUM 8.1*     Discharge Medications:   Allergies as of 03/09/2019   No Known  Allergies     Medication List    TAKE these medications   aspirin EC 81 MG tablet Take 81 mg by mouth at bedtime.   atorvastatin 40 MG tablet Commonly known as: LIPITOR Take 1 tablet (40 mg total) by mouth daily at 6 PM.   carvedilol 3.125 MG tablet Commonly known as: COREG Take 1 tablet (3.125 mg total) by mouth 2 (two) times daily with a meal.   clopidogrel 75 MG tablet Commonly known as: PLAVIX Take 1 tablet (75 mg total) by mouth daily.   digoxin 0.125 MG tablet Commonly known as: LANOXIN Take 1 tablet (0.125 mg total) by mouth daily.   ferrous [id number] C-folic acid capsule Commonly known as: TRINSICON / FOLTRIN Take 1 capsule by mouth 2 (two) times daily after a meal.   furosemide 40 MG tablet Commonly known as: LASIX Take 1 tablet (40 mg total) by mouth daily. Marland Kitchen   losartan 25 MG tablet Commonly known as: COZAAR Take 0.5 tablets (12.5 mg total) by mouth at bedtime.   methocarbamol 500 MG tablet Commonly known as: Robaxin Take 1 tablet (500 mg total) by mouth 4 (four) times daily.   multivitamins with iron Tabs tablet Take 1 tablet by mouth daily.   nitroGLYCERIN 0.4 MG SL tablet Commonly known as: NITROSTAT Place 1 tablet (0.4 mg total) under the tongue every 5 (five) minutes as needed for chest pain. NO more than 3 pills--if you have the need to  take 2 pills or more call MD   oxyCODONE 5 MG immediate release tablet Commonly known as: Roxicodone Take one to two tablets every 6 hrs as needed for post op pain   pantoprazole 20 MG tablet Commonly known as: PROTONIX Take 1 tablet (20 mg total) by mouth daily.   potassium chloride SA 20 MEQ tablet Commonly known as: KLOR-CON Take 1 tablet (20 mEq total) by mouth daily.   promethazine 12.5 MG tablet Commonly known as: PHENERGAN Take 0.5 tablets (6.25 mg total) by mouth every 8 (eight) hours as needed for nausea or vomiting.   senna-docusate 8.6-50 MG tablet Commonly known as: Senokot-S Take  2 tablets by mouth at bedtime as needed for mild constipation.   spironolactone 25 MG tablet Commonly known as: ALDACTONE Take 1 tablet (25 mg total) by mouth daily.   traZODone 50 MG tablet Commonly known as: DESYREL Take 0.5 tablets (25 mg total) by mouth at bedtime as needed for sleep.       Diagnostic Studies: DG Pelvis Portable  Result Date: 03/08/2019 CLINICAL DATA:  Status post right hip replacement EXAM: PORTABLE PELVIS 1-2 VIEWS COMPARISON:  Intraop film from earlier in the same day. FINDINGS: Right hip prosthesis is noted in satisfactory position. No acute bony or soft tissue abnormality is seen. IMPRESSION: Status post right hip replacement. Electronically Signed   By: Inez Catalina M.D.   On: 03/08/2019 11:12   DG C-Arm 1-60 Min-No Report  Result Date: 03/08/2019 Fluoroscopy was utilized by the requesting physician.  No radiographic interpretation.   DG HIP OPERATIVE UNILAT W OR W/O PELVIS RIGHT  Result Date: 03/08/2019 CLINICAL DATA:  Right hip arthroplasty EXAM: OPERATIVE RIGHT HIP (WITH PELVIS IF PERFORMED) 2 VIEWS TECHNIQUE: Fluoroscopic spot image(s) were submitted for interpretation post-operatively. COMPARISON:  None. FINDINGS: Multiple intraoperative fluoroscopic spot images are provided. Interval right total hip arthroplasty. FLUOROSCOPY TIME:  18 seconds IMPRESSION: Intraoperative localization. Electronically Signed   By: Kathreen Devoid   On: 03/08/2019 10:18    Disposition: Discharge disposition: 01-Home or [place]    Mcarthur Rossetti, MD Follow up in 2 week(s).   Specialty: Orthopedic Surgery Contact information: [address] [place] [address] [phone]            Signed: Erskine Emery 03/11/2019, 8:20 AM

## 2019-03-11 NOTE — Telephone Encounter (Signed)
Patient stopped taking Plavix end of December.  If you want him to resume, will need prescription.  Please call him to advise what he needs to take for post op blood thinner.  [phone]

## 2019-03-15 ENCOUNTER — Telehealth: Payer: Self-pay | Admitting: Orthopaedic Surgery

## 2019-03-15 NOTE — Telephone Encounter (Signed)
Patient called asked when can he start driving again. Patient want to be able to go to doctor appointments Etc. Patient said he will only 15 minutes one way.  Patient said he will not take any pain medication when he is driving.  The number to contact patient is [phone]

## 2019-03-15 NOTE — Telephone Encounter (Signed)
Please advise 

## 2019-03-15 NOTE — Telephone Encounter (Signed)
He can go ahead and drive since he is not taking the pain medicine.

## 2019-03-15 NOTE — Telephone Encounter (Signed)
Patient aware.

## 2019-03-18 ENCOUNTER — Telehealth: Payer: Self-pay | Admitting: Cardiology

## 2019-03-18 ENCOUNTER — Other Ambulatory Visit: Payer: Self-pay

## 2019-03-18 ENCOUNTER — Encounter (HOSPITAL_COMMUNITY): Payer: Self-pay | Admitting: Cardiology

## 2019-03-18 ENCOUNTER — Ambulatory Visit (HOSPITAL_COMMUNITY)
Admission: RE | Admit: 2019-03-18 | Discharge: 2019-03-18 | Disposition: A | Discharge status: Home / Self Care | Payer: Self-pay | Source: Ambulatory Visit | Attending: Cardiology | Admitting: Cardiology

## 2019-03-18 VITALS — BP 128/60 | HR 64 | Wt 230.4 lb

## 2019-03-18 DIAGNOSIS — Z951 Presence of aortocoronary bypass graft: Secondary | ICD-10-CM | POA: Insufficient documentation

## 2019-03-18 DIAGNOSIS — I110 Hypertensive heart disease with heart failure: Secondary | ICD-10-CM | POA: Insufficient documentation

## 2019-03-18 DIAGNOSIS — M25551 Pain in right hip: Secondary | ICD-10-CM | POA: Insufficient documentation

## 2019-03-18 DIAGNOSIS — Z79899 Other long term (current) drug therapy: Secondary | ICD-10-CM | POA: Insufficient documentation

## 2019-03-18 DIAGNOSIS — Z7982 Long term (current) use of aspirin: Secondary | ICD-10-CM | POA: Insufficient documentation

## 2019-03-18 DIAGNOSIS — Z8249 Family history of ischemic heart disease and other diseases of the circulatory system: Secondary | ICD-10-CM | POA: Insufficient documentation

## 2019-03-18 DIAGNOSIS — I255 Ischemic cardiomyopathy: Secondary | ICD-10-CM | POA: Insufficient documentation

## 2019-03-18 DIAGNOSIS — I6521 Occlusion and stenosis of right carotid artery: Secondary | ICD-10-CM | POA: Insufficient documentation

## 2019-03-18 DIAGNOSIS — I2510 Atherosclerotic heart disease of native coronary artery without angina pectoris: Secondary | ICD-10-CM | POA: Insufficient documentation

## 2019-03-18 DIAGNOSIS — I5022 Chronic systolic (congestive) heart failure: Secondary | ICD-10-CM | POA: Insufficient documentation

## 2019-03-18 LAB — BASIC METABOLIC PANEL
Anion gap: 13 (ref 5–15)
BUN: 25 mg/dL — ABNORMAL HIGH (ref 8–23)
CO2: 23 mmol/L (ref 22–32)
Calcium: 8.9 mg/dL (ref 8.9–10.3)
Chloride: 107 mmol/L (ref 98–111)
Creatinine, Ser: 0.97 mg/dL (ref 0.61–1.24)
GFR calc Af Amer: >60 mL/min (ref >60)
GFR calc non Af Amer: >60 mL/min (ref >60)
Glucose, Bld: 104 mg/dL — ABNORMAL HIGH (ref 70–99)
Potassium: 4 mmol/L (ref 3.5–5.1)
Sodium: 143 mmol/L (ref 135–145)

## 2019-03-18 LAB — LIPID PANEL
Cholesterol: 105 mg/dL (ref 0–200)
HDL: 34 mg/dL — ABNORMAL LOW (ref >40)
LDL Cholesterol: 61 mg/dL (ref 0–99)
Total CHOL/HDL Ratio: 3.1 RATIO
Triglycerides: 51 mg/dL (ref <150)
VLDL: 10 mg/dL (ref 0–40)

## 2019-03-18 LAB — DIGOXIN LEVEL: Digoxin Level: 0.9 ng/mL (ref 0.8–2.0)

## 2019-03-18 MED ORDER — LOSARTAN POTASSIUM 25 MG PO TABS: 25.0000 mg | ORAL_TABLET | Freq: Every day | ORAL | 5 refills | Status: DC

## 2019-03-18 MED ORDER — CARVEDILOL 6.25 MG PO TABS: 6.2500 mg | ORAL_TABLET | Freq: Two times a day (BID) | ORAL | 3 refills | Status: DC

## 2019-03-18 MED FILL — ?CARVEDILOL 6.25 MG TABLET: 6.25 | 30 days supply | Qty: 60 | Fill #0

## 2019-03-18 MED FILL — LOSARTAN POTASSIUM 25 MG TA: 25 | 30 days supply | Qty: 30 | Fill #0

## 2019-03-18 NOTE — Patient Instructions (Addendum)
INCREASE Coreg 6.25mg  (1 tab) twice a day  INCREASE Losartan 25mg  (1 tab) at bedtime  Labs today We will only contact you if something comes back abnormal or we need to make some changes. Otherwise no news is good news!   Your physician recommends that you schedule a follow-up appointment in: 2 weeks with the pharmacy and blood work  1 month with Dr Aundra Dubin and an ECHO   Your physician has requested that you have an echocardiogram. Echocardiography is a painless test that uses sound waves to create images of your heart. It provides your doctor with information about the size and shape of your heart and how well your heart's chambers and valves are working. This procedure takes approximately one hour. There are no restrictions for this procedure.    Please call office at [phone] option 2 if you have any questions or concerns.    At the [facility], you and your health needs are our priority. As part of our continuing mission to provide you with exceptional heart care, we have created designated Provider Care Teams. These Care Teams include your primary Cardiologist (physician) and Advanced Practice Providers (APPs- Physician Assistants and Nurse Practitioners) who all work together to provide you with the care you need, when you need it.   You may see any of the following providers on your designated Care Team at your next follow up: Marland Kitchen Dr Glori Bickers . Dr [name] . Darrick Grinder, NP . Lyda Jester, PA . Audry Riles, PharmD   Please be sure to bring in all your medications bottles to every appointment.

## 2019-03-18 NOTE — Progress Notes (Signed)
Date:  03/18/2019   Frank Love, DOB [birth year], MRN [id number]   Provider location: [facility] Type of Visit: Established patient   PCP:  [name], NP  Cardiologist:  [name], MD HF Cardiology: Dr Aundra Dubin    History of Present Illness: Frank Love is a 64 y.o. male with a history of HTN, LE edema, and elevated BNP who returns for followup of CHF and CAD.   He initially was seen by PCP in 5/20 with elevated BNP and was started on lasix with cardiology referral, lasix was subsequently increased to 40 mg bid due to ongoing LE edema. He underwent cardiac evaluation for right hip surgery in September with continued symptoms of LE edema on lasix 40 bid. Echo in 123456 showed systolic HF with EF 99991111. He was started on entresto, metoprolol at this time.   He underwent cath 11/20 which showed multivessel disease, now s/p CABGx4 12/12/18.  Repeat echo done 12/17/18 showed severely decreased right ventricular systolic function with severe enlargement in addition to small pericardial effusion, otherwise similar to previous. VQ scan was negative for PE. Transferred to CIR for rehab.   He had right THR done in 123XX123 with no complications.   He has been doing well recently. He still has some right hip pain/stiffness and walks with a cane.  No significant exertional dyspnea.  No lightheadedness, tolerating current medical regimen.  No chest pain.    ECG (personally reviewed): NSR, inferior Qs, lateral TWIs  Labs (2/21): K 4.7, creatinine 1.07  PMH: 1. OA: s/p right THR.  2. HTN 3. Carotid stenosis: Carotid dopplers (11/20) with 40-59% RICA stenosis.  4. CAD: LHC in 11/20 with totally occluded OM, totally occluded mid LAD, totally occluded mid RCA.  - CABG 11/20 with LIMA-LAD, RIMA-PDA, SVG-OM1, SVG-AM 5. Chronic systolic CHF: Ischemic cardiomyopathy.  - Echo (9/20): EF 30-35%, mildly decreased RV systolic function.  - Echo (11/20): EF  30-35%, severely dilated RV with severely decreased systolic function. 6. V/Q scan negative for PE in 11/20.   Past Surgical History:  Procedure Laterality Date  . CIRCUMCISION N/A 10/12/2018   Procedure: CIRCUMCISION ADULT;  Surgeon: Franchot Gallo, MD;  Location: [facility];  Service: Urology;  Laterality: N/A;  45 mins  . COLONOSCOPY    . CORONARY ARTERY BYPASS GRAFT N/A 12/12/2018   Procedure: CORONARY ARTERY BYPASS GRAFTING (CABG) x four, using bilateral internal mammary arteries and right leg greater saphenous vein harvested endoscopically;  Surgeon: [name], MD;  Location: [place] [facility];  Service: Open Heart Surgery;  Laterality: N/A;  . LEFT HEART CATH AND CORONARY ANGIOGRAPHY N/A 11/28/2018   Procedure: LEFT HEART CATH AND CORONARY ANGIOGRAPHY;  Surgeon: Burnell Blanks, MD;  Location: [facility] [facility];  Service: Cardiovascular;  Laterality: N/A;  . MEDIASTINAL EXPLORATION N/A 12/12/2018   Procedure: Mediastinal Re-Exploration after bypass graft;  Surgeon: [name], MD;  Location: [place];  Service: Open Heart Surgery;  Laterality: N/A;  . TEE WITHOUT CARDIOVERSION N/A 12/12/2018   Procedure: TRANSESOPHAGEAL ECHOCARDIOGRAM (TEE);  Surgeon: [name], MD;  Location: [place];  Service: Open Heart Surgery;  Laterality: N/A;  . TOOTH EXTRACTION    . TOTAL HIP ARTHROPLASTY Right 03/08/2019   Procedure: RIGHT TOTAL HIP ARTHROPLASTY ANTERIOR APPROACH;  Surgeon: Mcarthur Rossetti, MD;  Location: [facility];  Service: Orthopedics;  Laterality: Right;     Current Outpatient Medications  Medication Sig Dispense Refill  .  aspirin EC 81 MG tablet Take 81 mg by mouth at bedtime.     Marland Kitchen atorvastatin (LIPITOR) 40 MG tablet Take 1 tablet (40 mg total) by mouth daily at 6 PM. 30 tablet 5  . carvedilol (COREG) 6.25 MG tablet Take 1 tablet (6.25 mg total) by mouth 2 (two) times daily with a meal. 60 tablet 3  . digoxin (LANOXIN) 0.125 MG tablet Take 1 tablet (0.125 mg total) by  mouth daily. 30 tablet 5  . losartan (COZAAR) 25 MG tablet Take 1 tablet (25 mg total) by mouth at bedtime. 30 tablet 5  . methocarbamol (ROBAXIN) 500 MG tablet Take 500 mg by mouth every 6 (six) hours as needed for muscle spasms.    . Multiple Vitamins-Iron (MULTIVITAMINS WITH IRON) TABS tablet Take 1 tablet by mouth daily. 30 tablet 1  . nitroGLYCERIN (NITROSTAT) 0.4 MG SL tablet Place 1 tablet (0.4 mg total) under the tongue every 5 (five) minutes as needed for chest pain. NO more than 3 pills--if you have the need to take 2 pills or more call MD 30 tablet 0  . oxyCODONE (ROXICODONE) 5 MG immediate release tablet Take one to two tablets every 6 hrs as needed for post op pain 40 tablet 0  . pantoprazole (PROTONIX) 20 MG tablet Take 1 tablet (20 mg total) by mouth daily. 30 tablet 5  . promethazine (PHENERGAN) 12.5 MG tablet Take 0.5 tablets (6.25 mg total) by mouth every 8 (eight) hours as needed for nausea or vomiting. 30 tablet 0  . spironolactone (ALDACTONE) 25 MG tablet Take 1 tablet (25 mg total) by mouth daily. 30 tablet 3  . traZODone (DESYREL) 50 MG tablet Take 0.5 tablets (25 mg total) by mouth at bedtime as needed for sleep. 30 tablet 0   No current facility-administered medications for this encounter.    Allergies:   Patient has no known allergies.   Social History:  The patient  reports that he has never smoked. He has never used smokeless tobacco. He reports that he does not drink alcohol or use drugs.   Family History:  The patient's family history includes Diabetes in his mother; Heart disease in his mother.   ROS:  Please see the history of present illness.   All other systems are personally reviewed and negative.   Exam:  BP 128/60   Pulse 64   Wt 104.5 kg (230 lb 6.4 oz)   SpO2 100%   BMI 28.80 kg/m  General: NAD Neck: No JVD, no thyromegaly or thyroid nodule.  Lungs: Clear to auscultation bilaterally with normal respiratory effort. CV: Nondisplaced PMI.  Heart  regular S1/S2, no S3/S4, no murmur.  1+ ankle edema.  No carotid bruit.  Normal pedal pulses.  Abdomen: Soft, nontender, no hepatosplenomegaly, no distention.  Skin: Intact without lesions or rashes.  Neurologic: Alert and oriented x 3.  Psych: Normal affect. Extremities: No clubbing or cyanosis.  HEENT: Normal.   Recent Labs: 08/03/2018: BNP 316.2 12/13/2018: Magnesium 2.3 12/22/2018: ALT 22 03/09/2019: Hemoglobin 10.6; Platelets 122 03/18/2019: BUN 25; Creatinine, Ser 0.97; Potassium 4.0; Sodium 143  Personally reviewed   Wt Readings from Last 3 Encounters:  03/18/19 104.5 kg (230 lb 6.4 oz)  03/08/19 101.4 kg (223 lb 8.7 oz)  02/27/19 101.4 kg (223 lb 8 oz)   s  ASSESSMENT AND PLAN:   1. CAD: S/p CABG in 11/20. No chest pain.  - Continue ASA 81 daily.  - Continue statin. Check lipids today.  2. Chronic  systolic CHF: Ischemic cardiomyopathy.  Pre-CABG echo with EF 30-35%, mild RV dysfunction.  Post-CABG echo with EF 30-35%, severe RV systolic dysfunction.  V/Q scan done, no evidence for PE.  I was concerned for peri-op RV infarction. Initially hypotensive post-CABG requiring pressors and then midodrine, but improved over time. NYHA class II now, he is not volume overloaded on exam.  - Increase Coreg to 6.25 mg bid.   - Increase losartan to 25 mg qhs with BMET today and in 10 days. Eventual Entresto if BP remains stable.  - Continue spironolactone 25 mg daily.  - Continue digoxin, check level.  - Repeat echo in 1 month, will need to decide on ICD at that time.  Not CRT candidate with narrow QRS.  3. Carotid stenosis: He will need repeat carotid dopplers in 11/21.    Followup with HF pharmacist for med titration in 2 wks.  See me with echo 1 month.   Signed, [name], MD  03/18/2019  [facility] [address] [facility] and [place] [address] [phone] (office) [phone] (fax)

## 2019-03-18 NOTE — Telephone Encounter (Signed)
Pt c/o medication issue:  1. Name of Medication:  carvedilol (COREG) 6.25 MG tablet losartan (COZAAR) 25 MG tablet  2. How are you currently taking this medication (dosage and times per day)? Losartan once a day, carvedilol twice a day  3. Are you having a reaction (difficulty breathing--STAT)? no  4. What is your medication issue? Patient states Dr. Aundra Dubin changed his dose of the medications. He would like to know Dr. Rosezella [place] opinion.

## 2019-03-20 ENCOUNTER — Ambulatory Visit: Payer: No Typology Code available for payment source | Admitting: Adult Health

## 2019-03-20 NOTE — Telephone Encounter (Signed)
It looks like Dr. Aundra Dubin has assumed his care.  I will defer to Dr. Aundra Dubin.

## 2019-03-21 ENCOUNTER — Encounter: Payer: Self-pay | Admitting: Orthopaedic Surgery

## 2019-03-21 ENCOUNTER — Ambulatory Visit (INDEPENDENT_AMBULATORY_CARE_PROVIDER_SITE_OTHER): Payer: Self-pay | Admitting: Orthopaedic Surgery

## 2019-03-21 ENCOUNTER — Other Ambulatory Visit: Payer: Self-pay

## 2019-03-21 DIAGNOSIS — Z96641 Presence of right artificial hip joint: Secondary | ICD-10-CM

## 2019-03-21 NOTE — Telephone Encounter (Signed)
Spoke with patient. Informed him that Dr. Percival Spanish is deferring to Dr. Aundra Dubin. Patient verbalized understanding.

## 2019-03-21 NOTE — Progress Notes (Signed)
The patient is 13 days status post a right total hip arthroplasty.  He is ambulating with a cane.  Apparently he never did have any home therapy but he knows what to do he states exercise wise.  He says he is doing well with strength and range of motion.  He has been on aspirin only once a day and I think he does understand what we wanted him on.  Both his calves are soft.  On exam his right hip incision looks good.  The staples were removed and Steri-Strips applied.  His leg lengths are equal.  He tolerates his putting his leg through range of motion.  At this point we will see him back in 4 weeks to see how he is doing overall.  He will resume his just daily aspirin.  He will transition away from the cane once he is comfortable.  If he needs any other medications between now in 4 weeks he will let us know.  No x-rays are needed in 4 weeks from now.  All questions and concerns were answered and addressed.

## 2019-03-22 NOTE — Progress Notes (Signed)
PCP:  [name], NP   Cardiologist:  [name], MD HF Cardiology: Dr Aundra Dubin   HPI:  Frank Love is a 64 y.o. male with a history of HTN, LE edema, and elevated BNP who returns for followup of CHF and CAD.   He initially was seen by PCP in 5/20 with elevated BNP and was started on lasix with cardiology referral, lasix was subsequently increased to 40 mg bid due to ongoing LE edema. He underwent cardiac evaluation for right hip surgery in September with continued symptoms of LE edema on lasix 40 bid. Echo in 123456 showedsystolic HFwith EF 99991111.He was started on entresto, metoprolol at this time.  He underwent cath 11/20 which showedmultivessel disease, now s/p CABGx4 12/12/18.  Repeat echo done 12/17/18 showed severely decreased right ventricular systolic function with severe enlargement in addition to small pericardial effusion, otherwise similar to previous. VQ scan was negative for PE. Transferred to CIR for rehab.   He had right THR done in 123XX123 with no complications.   Recently presented to HF Clinic with Dr. Aundra Dubin. He reported some right hip pain/stiffness and still walks with a cane.  No significant exertional dyspnea.  No lightheadedness, tolerating current medical regimen.  No chest pain.    Today he returns to HF clinic for pharmacist medication titration. At last visit with MD, carvedilol was increased to 6.25 mg BID and losartan was increased to 25 mg daily. Overall he is doing well today. No dizziness, lightheadedness, chest pain or palpitations. He is still using a cane due to his recent hip surgery but notes his movement has greatly improved over the last week or so. 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 No SOB/DOE. He has not been weighing himself daily as he was unsteady on his scale after the surgery. Noted  his weight has been increasing (up ~10 lbs from last visit). He believes some of this is due to caloric weight gain as he has been eating more and exercising less after his surgery. He also has some extra fluid weight today which he believes is related to eating more salt than his usual lately. Has 1+ bilateral LEE in his ankles. Says he normally wears compression stockings but was waiting to put them on today until after the visit. Most of the swelling is above his right knee, which he attributes to the recent surgery. No JVD, PND or orthopnea. His appetite is good and he is planning on going back to restricting salt. He does practice fluid restriction. Taking all medications as prescribed. Tolerating all medications. BP in clinic was 142/84.    HF Medications: Carvedilol 6.25 mg BID Losartan 25 mg QHS Spironolactone 25 mg daily   Has the patient been experiencing any side effects to the medications prescribed?  no  Does the patient have any problems obtaining medications due to transportation or finances?   Yes - no prescription insurance. Obtains medications from [place].   Understanding of regimen: good Understanding of indications: good Potential of compliance: good Patient understands to avoid NSAIDs. Patient understands to avoid decongestants.    Pertinent Lab Values: . Serum creatinine 1.05, BUN 20, Potassium 4.3, Sodium 144  Vital Signs: . Weight: 241.2 lbs (last clinic weight: 230.4 lbs) . Blood pressure: 142/84  . Heart rate: 58  Assessment: 1. CAD: S/p CABG in 11/20. No chest pain.  - Continue ASA 81 daily.  - Continue atorvastatin 40 mg daily. Most recent lipid panel showed LDL 61, HDL 34, TG 51 (03/18/19) 2. Chronic systolic CHF: Ischemic cardiomyopathy.  Pre-CABG echo with EF 30-35%, mild RV dysfunction.  Post-CABG echo with EF 30-35%, severe RV systolic dysfunction.  V/Q scan done, no evidence for PE.  Initially hypotensive post-CABG requiring  pressors and then midodrine, but improved over time.  -NYHA class II, he mildly volume overloaded on exam.  - Labs stable: Scr 1.05, K 4.3 - Vitals: BP 142/84, HR 58 - Not currently taking any diuretic.  - Continue carvedilol 6.25 mg BID.   - Stop losartan and start Entresto 24/26 mg BID. Hopefully this will also help with fluid status. Repeat BMET in 2 weeks. [employer] Manufacturer's Assistance is pending.  - Continue spironolactone 25 mg daily.  - Continue digoxin 0.125 mg daily. Last digoxin level 0.9 ng/mL on 03/18/2019 (not true trough).  - Repeat echo in 1 month, will need to decide on ICD at that time.  Not CRT candidate with narrow QRS.  3. Carotid stenosis: He will need repeat carotid dopplers in 11/21.     Plan: 1) Medication changes: Based on clinical presentation, vital signs and recent labs will stop losartan and start Entresto 24/26 mg BID. 2) Labs: Scr 1.05, K 4.3 3) Follow-up: 2 weeks with Dr. Aundra Dubin in [facility].    Audry Riles, PharmD, BCPS, BCCP, CPP Heart Failure Clinic Pharmacist [phone]

## 2019-03-25 MED FILL — ?DIGITEK 125 MCG TABLET: 125 | 30 days supply | Qty: 30 | Fill #1

## 2019-03-25 MED FILL — ?ATORVASTATIN 40MG TABLET: 40 | 30 days supply | Qty: 30 | Fill #1

## 2019-03-25 MED FILL — PANTOPRAZOLE SOD DR 20 MG T: 20 | 30 days supply | Qty: 30 | Fill #1

## 2019-03-25 MED FILL — ?SPIRONOLACTONE 25 MG TABLE: 25 | 30 days supply | Qty: 30 | Fill #3

## 2019-04-04 ENCOUNTER — Ambulatory Visit (HOSPITAL_COMMUNITY)
Admission: RE | Admit: 2019-04-04 | Discharge: 2019-04-04 | Disposition: A | Discharge status: Home / Self Care | Payer: Self-pay | Source: Ambulatory Visit | Attending: Cardiology | Admitting: Cardiology

## 2019-04-04 ENCOUNTER — Other Ambulatory Visit: Payer: Self-pay

## 2019-04-04 ENCOUNTER — Telehealth (HOSPITAL_COMMUNITY): Payer: Self-pay | Admitting: Pharmacist

## 2019-04-04 VITALS — BP 142/84 | HR 58 | Wt 241.2 lb

## 2019-04-04 DIAGNOSIS — I110 Hypertensive heart disease with heart failure: Secondary | ICD-10-CM | POA: Insufficient documentation

## 2019-04-04 DIAGNOSIS — Z7982 Long term (current) use of aspirin: Secondary | ICD-10-CM | POA: Insufficient documentation

## 2019-04-04 DIAGNOSIS — Z951 Presence of aortocoronary bypass graft: Secondary | ICD-10-CM | POA: Insufficient documentation

## 2019-04-04 DIAGNOSIS — I5022 Chronic systolic (congestive) heart failure: Secondary | ICD-10-CM | POA: Insufficient documentation

## 2019-04-04 DIAGNOSIS — Z79899 Other long term (current) drug therapy: Secondary | ICD-10-CM | POA: Insufficient documentation

## 2019-04-04 DIAGNOSIS — I255 Ischemic cardiomyopathy: Secondary | ICD-10-CM | POA: Insufficient documentation

## 2019-04-04 DIAGNOSIS — I2510 Atherosclerotic heart disease of native coronary artery without angina pectoris: Secondary | ICD-10-CM | POA: Insufficient documentation

## 2019-04-04 LAB — BASIC METABOLIC PANEL
Anion gap: 8 (ref 5–15)
BUN: 20 mg/dL (ref 8–23)
CO2: 26 mmol/L (ref 22–32)
Calcium: 8.8 mg/dL — ABNORMAL LOW (ref 8.9–10.3)
Chloride: 110 mmol/L (ref 98–111)
Creatinine, Ser: 1.05 mg/dL (ref 0.61–1.24)
GFR calc Af Amer: >60 mL/min (ref >60)
GFR calc non Af Amer: >60 mL/min (ref >60)
Glucose, Bld: 95 mg/dL (ref 70–99)
Potassium: 4.3 mmol/L (ref 3.5–5.1)
Sodium: 144 mmol/L (ref 135–145)

## 2019-04-04 MED ORDER — SACUBITRIL-VALSARTAN 24-26 MG PO TABS: 1.0000 | ORAL_TABLET | Freq: Two times a day (BID) | ORAL | 11 refills | Status: DC

## 2019-04-04 NOTE — Telephone Encounter (Signed)
Sent in Manufacturer's Assistance application to [employer] for Entresto.    Application pending, will continue to follow.  Avril Busser, PharmD, BCPS, BCCP, CPP Heart Failure Clinic Pharmacist [phone]  

## 2019-04-04 NOTE — Patient Instructions (Addendum)
It was a pleasure seeing you today!  MEDICATIONS: -We are changing your medications today -Stop taking losartan. -Start taking Entresto 24/26 mg (1 tablet) twice daily. -Call if you have questions about your medications.  LABS: -We will call you if your labs need attention.  NEXT APPOINTMENT: Return to clinic in 2 weeks with Dr. Aundra Dubin.  In general, to take care of your heart failure: -Limit your fluid intake to 2 Liters (half-gallon) per day.   -Limit your salt intake to ideally 2-3 grams (2000-3000 mg) per day. -Weigh yourself daily and record, and bring that "weight diary" to your next appointment.  (Weight gain of 2-3 pounds in 1 day typically means fluid weight.) -The medications for your heart are to help your heart and help you live longer.   -Please contact us before stopping any of your heart medications.  Call the clinic at [phone] with questions or to reschedule future appointments.

## 2019-04-05 ENCOUNTER — Telehealth: Payer: Self-pay | Admitting: Cardiology

## 2019-04-05 NOTE — Telephone Encounter (Signed)
      [facility] Pre-operative Risk Assessment    Request for surgical clearance:  1. What type of surgery is being performed? Laser oblation of penile cancer   2. When is this surgery scheduled? TBD   3. What type of clearance is required (medical clearance vs. Pharmacy clearance to hold med vs. Both)? Both  4. Are there any medications that need to be held prior to surgery and how long? Baby aspirin held 5 days prior procedure  5. Practice name and name of physician performing surgery?Dr Franchot Gallo   6. What is your office phone number  [phone]   7.   What is your office fax number [phone]  8.   Anesthesia type (None, local, MAC, general) ? MAC and local   Frank Love 04/05/2019, 2:43 PM  _________________________________________________________________   (provider comments below)

## 2019-04-05 NOTE — Telephone Encounter (Signed)
   Primary Cardiologist: [name], MD  Chart reviewed as part of pre-operative protocol coverage. Patient was contacted 04/05/2019 in reference to pre-operative risk assessment for pending surgery as outlined below.  Frank Love was last seen on 03/18/2019 by Dr. Aundra Dubin.  Pt has hx of CABG in 11/2018 with ischemic cardiomyopathy, EF 30-35%. Now being followed by [facility], on HF GDMT and well compensated per last office visit 03/18/19. He underwent right THR 0000000 with no complications. He is currently doing well with no heart failure symptoms or chest pain. He has been out working all day without any issues.   Therefore, based on ACC/AHA guidelines, the patient would be at acceptable risk for the planned procedure without further cardiovascular testing.   Regarding ASA therapy, we recommend continuation of ASA throughout the perioperative period.  However, if the surgeon feels that cessation of ASA is required in the perioperative period, it may be stopped 5-7 days prior to surgery with a plan to resume it as soon as felt to be feasible from a surgical standpoint in the post-operative period.  I will route this recommendation to the requesting party via Epic fax function and remove from pre-op pool.  Please call with questions.  Daune Perch, NP 04/05/2019, 4:41 PM

## 2019-04-05 NOTE — Telephone Encounter (Signed)
[facility] Patient Advocate Encounter   Patient was approved to receive Entresto from [employer]. Patient is aware.   Patient ID: [id number] Effective dates: 04/05/19 through 04/04/20  Audry Riles, PharmD, BCPS, BCCP, CPP Heart Failure Clinic Pharmacist [phone]

## 2019-04-05 NOTE — Telephone Encounter (Signed)
Pt has hx of CABG in 11/2018 with ischemic cardiomyopathy, EF 30-35%. Now being followed by AHF, on HF GDMT and well compensated per last office visit 03/18/19. He underwent right THR 0000000 with no complications. Aspirin was held for that surgery.  I placed a call to pt to update status, left VM for him to call back to preop clinic. If still without symptoms, he can proceed with surgery.

## 2019-04-08 ENCOUNTER — Other Ambulatory Visit: Payer: Self-pay | Admitting: Urology

## 2019-04-08 DIAGNOSIS — Z08 Encounter for follow-up examination after completed treatment for malignant neoplasm: Secondary | ICD-10-CM

## 2019-04-09 ENCOUNTER — Telehealth: Payer: Self-pay | Admitting: Urology

## 2019-04-09 NOTE — Telephone Encounter (Signed)
Patient called regarding scheduling a laser procedure. I spoke with Estill Bamberg and she states she will talk to Dr Diona Fanti today about setting this up. Pt understood.

## 2019-04-16 ENCOUNTER — Other Ambulatory Visit: Payer: Self-pay | Admitting: Urology

## 2019-04-17 ENCOUNTER — Telehealth (HOSPITAL_COMMUNITY): Payer: Self-pay

## 2019-04-17 ENCOUNTER — Ambulatory Visit (INDEPENDENT_AMBULATORY_CARE_PROVIDER_SITE_OTHER): Payer: Self-pay | Admitting: Orthopaedic Surgery

## 2019-04-17 ENCOUNTER — Ambulatory Visit: Payer: Self-pay | Attending: Nurse Practitioner

## 2019-04-17 ENCOUNTER — Other Ambulatory Visit: Payer: Self-pay

## 2019-04-17 ENCOUNTER — Encounter: Payer: Self-pay | Admitting: Orthopaedic Surgery

## 2019-04-17 DIAGNOSIS — Z96641 Presence of right artificial hip joint: Secondary | ICD-10-CM

## 2019-04-17 NOTE — Progress Notes (Signed)
HPI: Frank Love is now 5 weeks 5 days status post right total hip arthroplasty.  He states he has no pain in the hip.  Still has a little bit of a limp has to adjust his hip prior adjust his hip before ambulating with first step.  He is using no assistive device to ambulate.  Physical exam: Right hip good range of motion without pain.  Right calf supple nontender.  Dorsiflexion plantarflexion right ankle intact.  Impression: Status post right total hip arthroplasty 03/08/2019  Plan: We will continue to work on range of motion strengthening right hip.  Follow-up with [place] in 5 months at that time obtain an AP pelvis and lateral view of his right hip.  He will return sooner if there is any questions concerns or any wound healing issues.  Questions were encouraged and answered.

## 2019-04-17 NOTE — Telephone Encounter (Signed)

## 2019-04-18 ENCOUNTER — Encounter (HOSPITAL_COMMUNITY): Payer: Self-pay | Admitting: Cardiology

## 2019-04-18 ENCOUNTER — Ambulatory Visit (HOSPITAL_BASED_OUTPATIENT_CLINIC_OR_DEPARTMENT_OTHER)
Admission: RE | Admit: 2019-04-18 | Discharge: 2019-04-18 | Disposition: A | Discharge status: Home / Self Care | Payer: Self-pay | Source: Ambulatory Visit | Attending: Cardiology | Admitting: Cardiology

## 2019-04-18 ENCOUNTER — Ambulatory Visit (HOSPITAL_COMMUNITY)
Admission: RE | Admit: 2019-04-18 | Discharge: 2019-04-18 | Disposition: A | Discharge status: Home / Self Care | Payer: Self-pay | Source: Ambulatory Visit | Attending: Cardiology | Admitting: Cardiology

## 2019-04-18 VITALS — BP 132/60 | HR 50 | Wt 241.6 lb

## 2019-04-18 DIAGNOSIS — I5022 Chronic systolic (congestive) heart failure: Secondary | ICD-10-CM

## 2019-04-18 DIAGNOSIS — G4733 Obstructive sleep apnea (adult) (pediatric): Secondary | ICD-10-CM | POA: Insufficient documentation

## 2019-04-18 DIAGNOSIS — Z7982 Long term (current) use of aspirin: Secondary | ICD-10-CM | POA: Insufficient documentation

## 2019-04-18 DIAGNOSIS — I255 Ischemic cardiomyopathy: Secondary | ICD-10-CM | POA: Insufficient documentation

## 2019-04-18 DIAGNOSIS — Z8249 Family history of ischemic heart disease and other diseases of the circulatory system: Secondary | ICD-10-CM | POA: Insufficient documentation

## 2019-04-18 DIAGNOSIS — I2510 Atherosclerotic heart disease of native coronary artery without angina pectoris: Secondary | ICD-10-CM

## 2019-04-18 DIAGNOSIS — I313 Pericardial effusion (noninflammatory): Secondary | ICD-10-CM | POA: Insufficient documentation

## 2019-04-18 DIAGNOSIS — Z79899 Other long term (current) drug therapy: Secondary | ICD-10-CM | POA: Insufficient documentation

## 2019-04-18 DIAGNOSIS — Z7901 Long term (current) use of anticoagulants: Secondary | ICD-10-CM | POA: Insufficient documentation

## 2019-04-18 DIAGNOSIS — I5023 Acute on chronic systolic (congestive) heart failure: Secondary | ICD-10-CM

## 2019-04-18 DIAGNOSIS — Z951 Presence of aortocoronary bypass graft: Secondary | ICD-10-CM | POA: Insufficient documentation

## 2019-04-18 DIAGNOSIS — I252 Old myocardial infarction: Secondary | ICD-10-CM | POA: Insufficient documentation

## 2019-04-18 DIAGNOSIS — I110 Hypertensive heart disease with heart failure: Secondary | ICD-10-CM | POA: Insufficient documentation

## 2019-04-18 LAB — BASIC METABOLIC PANEL
Anion gap: 10 (ref 5–15)
BUN: 23 mg/dL (ref 8–23)
CO2: 22 mmol/L (ref 22–32)
Calcium: 8.9 mg/dL (ref 8.9–10.3)
Chloride: 108 mmol/L (ref 98–111)
Creatinine, Ser: 0.89 mg/dL (ref 0.61–1.24)
GFR calc Af Amer: >60 mL/min (ref >60)
GFR calc non Af Amer: >60 mL/min (ref >60)
Glucose, Bld: 99 mg/dL (ref 70–99)
Potassium: 4.3 mmol/L (ref 3.5–5.1)
Sodium: 140 mmol/L (ref 135–145)

## 2019-04-18 LAB — DIGOXIN LEVEL: Digoxin Level: 0.6 ng/mL — ABNORMAL LOW (ref 0.8–2.0)

## 2019-04-18 LAB — CBC
HCT: 37.9 % — ABNORMAL LOW (ref 39.0–52.0)
Hemoglobin: 11.4 g/dL — ABNORMAL LOW (ref 13.0–17.0)
MCH: 27.9 pg (ref 26.0–34.0)
MCHC: 30.1 g/dL (ref 30.0–36.0)
MCV: 92.7 fL (ref 80.0–100.0)
Platelets: 187 10*3/uL (ref 150–400)
RBC: 4.09 MIL/uL — ABNORMAL LOW (ref 4.22–5.81)
RDW: 14.5 % (ref 11.5–15.5)
WBC: 6 10*3/uL (ref 4.0–10.5)
nRBC: 0 % (ref 0.0–0.2)

## 2019-04-18 MED ORDER — SACUBITRIL-VALSARTAN 49-51 MG PO TABS: 1.0000 | ORAL_TABLET | Freq: Two times a day (BID) | ORAL | 5 refills | Status: DC

## 2019-04-18 MED ORDER — FUROSEMIDE 20 MG PO TABS: 20.0000 mg | ORAL_TABLET | Freq: Every day | ORAL | 3 refills | Status: DC

## 2019-04-18 MED FILL — FUROSEMIDE 20 MG TABS: 20 | 30 days supply | Qty: 30 | Fill #0

## 2019-04-18 NOTE — Progress Notes (Signed)
  Echocardiogram 2D Echocardiogram has been performed.  Frank Love 04/18/2019, 11:15 AM

## 2019-04-18 NOTE — Progress Notes (Signed)
ReDS Vest / Clip - 04/18/19 1200      ReDS Vest / Clip   Station Marker  D    Ruler Value  34    ReDS Value Range  (!) High volume overload    ReDS Actual Value  40    Anatomical Comments  sitting

## 2019-04-18 NOTE — Patient Instructions (Addendum)
START Lasix 20mg  (1 tab) daily   INCREASE Entresto to 49/51mg  (1 tab) twice a day   PLEASE CHECK IF YOU ARE TAKING LOSARTAN.  YOU ARE NOT SUPPOSED TO.     Labs today and repeat in 10 days We will only contact you if something comes back abnormal or we need to make some changes. Otherwise no news is good news!   You were ordered for an MRI of your heart. You will get a call to schedule this appointment.    Your physician recommends that you schedule a follow-up appointment in: 3 weeks with Pharmacy   Your physician recommends that you schedule a follow-up appointment in: 3 months with Dr Aundra Dubin. We will call you to schedule this appointment.   Please call office at [phone] option 2 if you have any questions or concerns.    At the [facility], you and your health needs are our priority. As part of our continuing mission to provide you with exceptional heart care, we have created designated Provider Care Teams. These Care Teams include your primary Cardiologist (physician) and Advanced Practice Providers (APPs- Physician Assistants and Nurse Practitioners) who all work together to provide you with the care you need, when you need it.   You may see any of the following providers on your designated Care Team at your next follow up: Marland Kitchen Dr Glori Bickers . Dr [name] . Darrick Grinder, NP . Lyda Jester, PA . Audry Riles, PharmD   Please be sure to bring in all your medications bottles to every appointment.

## 2019-04-20 NOTE — Progress Notes (Signed)
Date:  04/20/2019   ID:  Frank Love, DOB Aug 24, 1955, MRN [id number]   Provider location:  [facility] Type of Visit: Established patient   PCP:  [name], NP  Cardiologist:  [name], MD HF Cardiology: Dr Aundra Dubin    History of Present Illness: Frank Love is a 64 y.o. male with a history of HTN, LE edema, and elevated BNP who returns for followup of CHF and CAD.   He initially was seen by PCP in 5/20 with elevated BNP and was started on lasix with cardiology referral, lasix was subsequently increased to 40 mg bid due to ongoing LE edema. He underwent cardiac evaluation for right hip surgery in September with continued symptoms of LE edema on lasix 40 bid. Echo in [id number] showed systolic HF with EF 99991111. He was started on entresto, metoprolol at this time.   He underwent cath 11/20 which showed multivessel disease, now s/p CABGx4 12/12/18.  Repeat echo done 12/17/18 showed severely decreased right ventricular systolic function with severe enlargement in addition to small pericardial effusion, otherwise similar to previous. VQ scan was negative for PE. Transferred to CIR for rehab.   He had right THR done in [id number] with no complications.   Echo was done today and reviewed, EF 35-40%, mild LVH, mild LV dilation, moderately decreased RV systolic function, normal RV size.    Patient has been doing well symptomatically.  He is back at work ([employer]).  Weight is up 11 lbs, reports eating more.  No chest pain.  He denies significant exertional dyspnea though he is not very active.  He had a sleep study with mild OSA about 2 years ago but has lost a lot of weight since and does not snore and has not daytime sleepiness.     ECG (personally reviewed): NSR, left axis deviation, IVCD 128 msec, old anterior MI  REDS clip: 40%.   Labs (2/21): K 4.7, creatinine 1.07, LDL 61 Labs (3/21): K 4.3, creatinine 1.05, digoxin 0.9  PMH: 1. OA: s/p  right THR.  2. HTN 3. Carotid stenosis: Carotid dopplers (11/20) with 40-59% RICA stenosis.  4. CAD: LHC in 11/20 with totally occluded OM, totally occluded mid LAD, totally occluded mid RCA.  - CABG 11/20 with LIMA-LAD, RIMA-PDA, SVG-OM1, SVG-AM 5. Chronic systolic CHF: Ischemic cardiomyopathy.  - Echo (9/20): EF 30-35%, mildly decreased RV systolic function.  - Echo (11/20): EF 30-35%, severely dilated RV with severely decreased systolic function. - Echo (3/21): EF 35-40%, mild LV dilation with mild LVH, moderately decreased RV systolic function with normal RV size, PASP 33 mmHg.   6. V/Q scan negative for PE in 11/20.   Past Surgical History:  Procedure Laterality Date  . CIRCUMCISION N/A 10/12/2018   Procedure: CIRCUMCISION ADULT;  Surgeon: Franchot Gallo, MD;  Location: [facility];  Service: Urology;  Laterality: N/A;  45 mins  . COLONOSCOPY    . CORONARY ARTERY BYPASS GRAFT N/A 12/12/2018   Procedure: CORONARY ARTERY BYPASS GRAFTING (CABG) x four, using bilateral internal mammary arteries and right leg greater saphenous vein harvested endoscopically;  Surgeon: [name], MD;  Location: [facility];  Service: Open Heart Surgery;  Laterality: N/A;  . LEFT HEART CATH AND CORONARY ANGIOGRAPHY N/A 11/28/2018   Procedure: LEFT HEART CATH AND CORONARY ANGIOGRAPHY;  Surgeon: Burnell Blanks, MD;  Location: [place] [facility];  Service: Cardiovascular;  Laterality: N/A;  . MEDIASTINAL EXPLORATION N/A 12/12/2018   Procedure:  Mediastinal Re-Exploration after bypass graft;  Surgeon: [name], MD;  Location: [facility];  Service: Open Heart Surgery;  Laterality: N/A;  . TEE WITHOUT CARDIOVERSION N/A 12/12/2018   Procedure: TRANSESOPHAGEAL ECHOCARDIOGRAM (TEE);  Surgeon: [name], MD;  Location: [facility];  Service: Open Heart Surgery;  Laterality: N/A;  . TOOTH EXTRACTION    . TOTAL HIP ARTHROPLASTY Right 03/08/2019   Procedure: RIGHT TOTAL HIP ARTHROPLASTY ANTERIOR APPROACH;   Surgeon: Mcarthur Rossetti, MD;  Location: [facility];  Service: Orthopedics;  Laterality: Right;     Current Outpatient Medications  Medication Sig Dispense Refill  . aspirin EC 81 MG tablet Take 81 mg by mouth at bedtime.     Marland Kitchen atorvastatin (LIPITOR) 40 MG tablet Take 1 tablet (40 mg total) by mouth daily at 6 PM. 30 tablet 5  . carvedilol (COREG) 6.25 MG tablet Take 1 tablet (6.25 mg total) by mouth 2 (two) times daily with a meal. 60 tablet 3  . digoxin (LANOXIN) 0.125 MG tablet Take 1 tablet (0.125 mg total) by mouth daily. 30 tablet 5  . Multiple Vitamins-Iron (MULTIVITAMINS WITH IRON) TABS tablet Take 1 tablet by mouth daily. 30 tablet 1  . nitroGLYCERIN (NITROSTAT) 0.4 MG SL tablet Place 1 tablet (0.4 mg total) under the tongue every 5 (five) minutes as needed for chest pain. NO more than 3 pills--if you have the need to take 2 pills or more call MD 30 tablet 0  . oxyCODONE (ROXICODONE) 5 MG immediate release tablet Take one to two tablets every 6 hrs as needed for post op pain (Patient taking differently: Take 5-10 mg by mouth every 6 (six) hours as needed for moderate pain. ) 40 tablet 0  . pantoprazole (PROTONIX) 20 MG tablet Take 1 tablet (20 mg total) by mouth daily. 30 tablet 5  . spironolactone (ALDACTONE) 25 MG tablet Take 1 tablet (25 mg total) by mouth daily. 30 tablet 3  . traZODone (DESYREL) 50 MG tablet Take 0.5 tablets (25 mg total) by mouth at bedtime as needed for sleep. 30 tablet 0  . furosemide (LASIX) 20 MG tablet Take 1 tablet (20 mg total) by mouth daily. 90 tablet 3  . sacubitril-valsartan (ENTRESTO) 49-51 MG Take 1 tablet by mouth 2 (two) times daily. 60 tablet 5   No current facility-administered medications for this encounter.    Allergies:   Patient has no known allergies.   Social History:  The patient  reports that he has never smoked. He has never used smokeless tobacco. He reports that he does not drink alcohol or use drugs.   Family History:  The  patient's family history includes Diabetes in his mother; Heart disease in his mother.   ROS:  Please see the history of present illness.   All other systems are personally reviewed and negative.   Exam:  BP 132/60   Pulse (!) 50   Wt 109.6 kg (241 lb 9.6 oz)   SpO2 99%   BMI 30.20 kg/m  General: NAD Neck: JVP 8 cm, no thyromegaly or thyroid nodule.  Lungs: Clear to auscultation bilaterally with normal respiratory effort. CV: Nondisplaced PMI.  Heart regular S1/S2, no S3/S4, no murmur.  1+ edema to knees.  No carotid bruit.  Normal pedal pulses.  Abdomen: Soft, nontender, no hepatosplenomegaly, no distention.  Skin: Intact without lesions or rashes.  Neurologic: Alert and oriented x 3.  Psych: Normal affect. Extremities: No clubbing or cyanosis.  HEENT: Normal.   Recent Labs:  08/03/2018: BNP 316.2 12/13/2018: Magnesium 2.3 12/22/2018: ALT 22 04/18/2019: BUN 23; Creatinine, Ser 0.89; Hemoglobin 11.4; Platelets 187; Potassium 4.3; Sodium 140  Personally reviewed   Wt Readings from Last 3 Encounters:  04/18/19 109.6 kg (241 lb 9.6 oz)  04/04/19 109.4 kg (241 lb 3.2 oz)  03/18/19 104.5 kg (230 lb 6.4 oz)   s  ASSESSMENT AND PLAN:   1. CAD: S/p CABG in 11/20. No chest pain.  - Continue ASA 81 daily.  - Continue statin. Good lipids in 2/21.  2. Chronic systolic CHF: Ischemic cardiomyopathy.  Pre-CABG echo with EF 30-35%, mild RV dysfunction.  Post-CABG echo with EF 30-35%, severe RV systolic dysfunction.  V/Q scan done, no evidence for PE.  I was concerned for peri-op RV infarction. Initially hypotensive post-CABG requiring pressors and then midodrine, but improved over time. Echo was done today and reviewed, EF 35-40% with moderate RV dysfunction.  NYHA class II now, he is mildly volume overloaded on exam and also volume overloaded by REDS clip.  - Continue Coreg 6.25 mg bid.   - Increase Entresto to 49/51 bid. BMET today and in 10 days.  - Continue spironolactone 25 mg daily.    - Continue digoxin, check level.  - Start Lasix 20 mg daily.  - Echo is borderline for ICD.  I will arrange for cardiac MRI to quantify EF more exactly for purposes of ICD determination.  Narrow QRS so not CRT candidate.  3. Carotid stenosis: He will need repeat carotid dopplers in 11/21.    Followup with HF pharmacist for med titration in 3 wks.  See me in 3 months.    Signed, [name], MD  04/20/2019  [place] [address] [facility] [place] [address] [phone] (office) [phone] (fax)

## 2019-04-22 MED FILL — PANTOPRAZOLE SOD DR 20 MG T: 20 | 30 days supply | Qty: 30 | Fill #2

## 2019-04-22 MED FILL — ?CARVEDILOL 6.25 MG TABLET: 6.25 | 30 days supply | Qty: 60 | Fill #1

## 2019-04-22 MED FILL — ?ATORVASTATIN 40MG TABLET: 40 | 30 days supply | Qty: 30 | Fill #2

## 2019-04-22 MED FILL — ?DIGITEK 125 MCG TABLET: 125 | 30 days supply | Qty: 30 | Fill #2

## 2019-04-22 NOTE — Patient Instructions (Addendum)
DUE TO COVID-19 ONLY ONE VISITOR IS ALLOWED TO COME WITH YOU AND STAY IN THE WAITING ROOM ONLY DURING PRE OP AND PROCEDURE DAY OF SURGERY. THE 1 VISITOR MAY VISIT WITH YOU AFTER SURGERY IN YOUR PRIVATE ROOM DURING VISITING HOURS ONLY!  YOU NEED TO HAVE A COVID 19 TEST ON: 04/25/19 @ 8:20 am , THIS TEST MUST BE DONE BEFORE SURGERY, COME  [place], [place] , [id number].  ([place]) ONCE YOUR COVID TEST IS COMPLETED, PLEASE BEGIN THE QUARANTINE INSTRUCTIONS AS OUTLINED IN YOUR HANDOUT.                [place]    Your procedure is scheduled on: 04/29/19   Report to [facility] Main  Entrance   Report to admitting at : 6:30 amAM     Call this number if you have problems the morning of surgery [phone]    Remember: Do not eat food or drink liquids :After Midnight.   BRUSH YOUR TEETH MORNING OF SURGERY AND RINSE YOUR MOUTH OUT, NO CHEWING GUM CANDY OR MINTS.     Take these medicines the morning of surgery with A SIP OF WATER: carvedilol,digoxin,pantoprazole.                                You may not have any metal on your body including hair pins and              piercings  Do not wear jewelry, lotions, powders or perfumes, deodorant             Men may shave face and neck.   Do not bring valuables to the hospital. [place].  Contacts, dentures or bridgework may not be worn into surgery.  Leave suitcase in the car. After surgery it may be brought to your room.     Patients discharged the day of surgery will not be allowed to drive home. IF YOU ARE HAVING SURGERY AND GOING HOME THE SAME DAY, YOU MUST HAVE AN ADULT TO DRIVE YOU HOME AND BE WITH YOU FOR 24 HOURS. YOU MAY GO HOME BY TAXI OR UBER OR ORTHERWISE, BUT AN ADULT MUST ACCOMPANY YOU HOME AND STAY WITH YOU FOR 24 HOURS.  Name and phone number of your driver:  Special Instructions: N/A              Please read over the following fact  sheets you were given: _____________________________________________________________________             [facility] - Preparing for Surgery Before surgery, you can play an important role.  Because skin is not sterile, your skin needs to be as free of germs as possible.  You can reduce the number of germs on your skin by washing with CHG (chlorahexidine gluconate) soap before surgery.  CHG is an antiseptic cleaner which kills germs and bonds with the skin to continue killing germs even after washing. Please DO NOT use if you have an allergy to CHG or antibacterial soaps.  If your skin becomes reddened/irritated stop using the CHG and inform your nurse when you arrive at Short Stay. Do not shave (including legs and underarms) for at least 48 hours prior to the first CHG shower.  You may shave your face/neck. Please follow these instructions  carefully:  1.  Shower with CHG Soap the night before surgery and the  morning of Surgery.  2.  If you choose to wash your hair, wash your hair first as usual with your  normal  shampoo.  3.  After you shampoo, rinse your hair and body thoroughly to remove the  shampoo.                           4.  Use CHG as you would any other liquid soap.  You can apply chg directly  to the skin and wash                       Gently with a scrungie or clean washcloth.  5.  Apply the CHG Soap to your body ONLY FROM THE NECK DOWN.   Do not use on face/ open                           Wound or open sores. Avoid contact with eyes, ears mouth and genitals (private parts).                       Wash face,  Genitals (private parts) with your normal soap.             6.  Wash thoroughly, paying special attention to the area where your surgery  will be performed.  7.  Thoroughly rinse your body with warm water from the neck down.  8.  DO NOT shower/wash with your normal soap after using and rinsing off  the CHG Soap.                9.  Pat yourself dry with a clean towel.             10.  Wear clean pajamas.            11.  Place clean sheets on your bed the night of your first shower and do not  sleep with pets. Day of Surgery : Do not apply any lotions/deodorants the morning of surgery.  Please wear clean clothes to the hospital/surgery center.  FAILURE TO FOLLOW THESE INSTRUCTIONS MAY RESULT IN THE CANCELLATION OF YOUR SURGERY PATIENT SIGNATURE_________________________________  NURSE SIGNATURE__________________________________  ________________________________________________________________________

## 2019-04-23 ENCOUNTER — Encounter (HOSPITAL_COMMUNITY): Payer: Self-pay

## 2019-04-23 ENCOUNTER — Other Ambulatory Visit: Payer: Self-pay

## 2019-04-23 ENCOUNTER — Encounter (HOSPITAL_COMMUNITY)
Admission: RE | Admit: 2019-04-23 | Discharge: 2019-04-23 | Disposition: A | Discharge status: Home / Self Care | Payer: Self-pay | Source: Ambulatory Visit | Attending: Urology | Admitting: Urology

## 2019-04-23 ENCOUNTER — Ambulatory Visit (HOSPITAL_COMMUNITY)
Admission: RE | Admit: 2019-04-23 | Discharge: 2019-04-23 | Disposition: A | Discharge status: Home / Self Care | Payer: Self-pay | Source: Ambulatory Visit | Attending: Urology | Admitting: Urology

## 2019-04-23 ENCOUNTER — Other Ambulatory Visit (HOSPITAL_COMMUNITY): Payer: Self-pay | Admitting: *Deleted

## 2019-04-23 DIAGNOSIS — Z8549 Personal history of malignant neoplasm of other male genital organs: Secondary | ICD-10-CM | POA: Insufficient documentation

## 2019-04-23 DIAGNOSIS — Z08 Encounter for follow-up examination after completed treatment for malignant neoplasm: Secondary | ICD-10-CM | POA: Insufficient documentation

## 2019-04-23 HISTORY — DX: Malignant (primary) neoplasm, unspecified: C80.1

## 2019-04-23 MED ORDER — IOHEXOL 300 MG/ML  SOLN
100.0000 mL | Freq: Once | INTRAMUSCULAR | Status: AC | PRN
  Administered 2019-04-23: 100 mL via INTRAVENOUS

## 2019-04-23 MED ORDER — SPIRONOLACTONE 25 MG PO TABS: 25.0000 mg | ORAL_TABLET | Freq: Every day | ORAL | 3 refills | Status: DC

## 2019-04-23 MED FILL — ?SPIRONOLACTONE 25 MG TABLE: 25 | 30 days supply | Qty: 30 | Fill #0

## 2019-04-24 ENCOUNTER — Encounter (HOSPITAL_COMMUNITY): Admission: RE | Admit: 2019-04-24 | Payer: Self-pay | Source: Ambulatory Visit

## 2019-04-24 ENCOUNTER — Ambulatory Visit: Payer: Self-pay | Attending: Nurse Practitioner | Admitting: Nurse Practitioner

## 2019-04-24 NOTE — Anesthesia Preprocedure Evaluation (Addendum)
Anesthesia Evaluation  Patient identified by MRN, date of birth, ID band Patient awake    Reviewed: Allergy & Precautions, H&P , NPO status , Patient's Chart, lab work & pertinent test results  Airway Mallampati: II  TM Distance: >3 FB Neck ROM: Full    Dental no notable dental hx. (+) Poor Dentition, Chipped, Missing, Dental Advisory Given,    Pulmonary neg pulmonary ROS,    Pulmonary exam normal breath sounds clear to auscultation       Cardiovascular Exercise Tolerance: Good hypertension, Pt. on medications + CAD and + CABG  negative cardio ROS Normal cardiovascular exam Rhythm:Regular Rate:Normal     Neuro/Psych negative neurological ROS  negative psych ROS   GI/Hepatic negative GI ROS, Neg liver ROS,   Endo/Other  negative endocrine ROS  Renal/GU negative Renal ROS  negative genitourinary   Musculoskeletal negative musculoskeletal ROS (+) Arthritis ,   Abdominal   Peds negative pediatric ROS (+)  Hematology negative hematology ROS (+) Blood dyscrasia, anemia ,   Anesthesia Other Findings   Reproductive/Obstetrics negative OB ROS                           Anesthesia Physical Anesthesia Plan  ASA: III  Anesthesia Plan: MAC   Post-op Pain Management:    Induction: Intravenous  PONV Risk Score and Plan: 1  Airway Management Planned: Nasal Cannula and Simple Face Mask  Additional Equipment:   Intra-op Plan:   Post-operative Plan:   Informed Consent: I have reviewed the patients History and Physical, chart, labs and discussed the procedure including the risks, benefits and alternatives for the proposed anesthesia with the patient or authorized representative who has indicated his/her understanding and acceptance.       Plan Discussed with: Anesthesiologist and CRNA  Anesthesia Plan Comments: (EKG: 04/18/2019 Rate 52 bpm Sinus bradycardia Left axis  deviation Inferior infarct , age undetermined Cannot rule out Anterior infarct , age undetermined T wave abnormality, consider lateral ischemia Abnormal ECG No significant change since last tracing  CV: Echo 04/18/2019 1. Left ventricular ejection fraction, by estimation, is 35 to 40%. The  left ventricle has moderately decreased function. The left ventricle  demonstrates global hypokinesis. The left ventricular internal cavity size  was mildly dilated. There is mild  left ventricular hypertrophy. Left ventricular diastolic parameters are  consistent with Grade II diastolic dysfunction (pseudonormalization).  2. Right ventricular systolic function is moderately reduced. The right  ventricular size is mildly enlarged. There is mildly elevated pulmonary  artery systolic pressure. The estimated right ventricular systolic  pressure is 17.3 mmHg.  3. Left atrial size was mildly dilated.  )       Anesthesia Quick Evaluation

## 2019-04-24 NOTE — Progress Notes (Signed)
Anesthesia Chart Review   Case: [id number] Date/Time: 04/29/19 0815   Procedure: CO2 LASER APPLICATION (N/A ) - 30 MINS   Anesthesia type: Monitor Anesthesia Care   Pre-op diagnosis: PENILE CANCER   [facility]   Surgeons: Franchot Gallo, MD      DISCUSSION:64 y.o. never smoker with h/o HTN, CAD (CABG x4 12/12/2018), CHF (EF 35 to 40% on Echo 04/18/2019), penile cancer scheduled for above procedure 04/29/2019 with Dr. Franchot Gallo.   Pt last seen by cardiologist, Dr. Aundra Dubin, 04/18/2019.  Doing well at this visit, asymptomatic from CHF standpoint.   Cleared by cardiology 04/05/19.  Per Daune Perch, NP, "Patient was contacted 04/05/2019 in reference to pre-operative risk assessment for pending surgery as outlined below.  Frank Love was last seen on 03/18/2019 by Dr. Aundra Dubin.  Pt has hx of CABG in 11/2018 with ischemic cardiomyopathy, EF 30-35%. Now being followed by [facility] clinic, on HF GDMT and well compensated per last office visit 03/18/19. He underwent right THR 0000000 with no complications. He is currently doing well with no heart failure symptoms or chest pain. He has been out working all day without any issues.   Therefore, based on ACC/AHA guidelines, the patient would be at acceptable risk for the planned procedure without further cardiovascular testing.   Regarding ASA therapy, we recommend continuation of ASA throughout the perioperative period.  However, if the surgeon feels that cessation of ASA is required in the perioperative period, it may be stopped 5-7 days prior to surgery with a plan to resume it as soon as felt to be feasible from a surgical standpoint in the post-operative period."  Anticipate pt can proceed with planned procedure barring acute status change.   VS: Ht 6' 2.5" (1.892 m)   Wt 109.3 kg   BMI 30.53 kg/m   PROVIDERS: [name], NP is PCP   [name], MD is Cardiologist   [name],  MD is Cardiologist  LABS: Labs reviewed: Acceptable for surgery. (all labs ordered are listed, but only abnormal results are displayed)  Labs Reviewed - No data to display   IMAGES:   EKG: 04/18/2019 Rate 52 bpm Sinus bradycardia Left axis deviation Inferior infarct , age undetermined Cannot rule out Anterior infarct , age undetermined T wave abnormality, consider lateral ischemia Abnormal ECG No significant change since last tracing  CV: Echo 04/18/2019 IMPRESSIONS    1. Left ventricular ejection fraction, by estimation, is 35 to 40%. The  left ventricle has moderately decreased function. The left ventricle  demonstrates global hypokinesis. The left ventricular internal cavity size  was mildly dilated. There is mild  left ventricular hypertrophy. Left ventricular diastolic parameters are  consistent with Grade II diastolic dysfunction (pseudonormalization).  2. Right ventricular systolic function is moderately reduced. The right  ventricular size is mildly enlarged. There is mildly elevated pulmonary  artery systolic pressure. The estimated right ventricular systolic  pressure is 99991111 mmHg.  3. Left atrial size was mildly dilated.  4. Right atrial size was mildly dilated.  5. The mitral valve is normal in structure. Trivial mitral valve  regurgitation.  6. The aortic valve is tricuspid. Aortic valve regurgitation is trivial.  No aortic stenosis is present.  7. The inferior vena cava is normal in size with greater than 50%  respiratory variability, suggesting right atrial pressure of 3 mmHg  Cardiac Cath 11/28/2018  Mid RCA lesion is 100% stenosed.  Ost LM to Mid LM lesion  is 20% stenosed.  2nd Mrg lesion is 100% stenosed.  Mid LAD-1 lesion is 99% stenosed.  Mid LAD-2 lesion is 100% stenosed.   1. The LAD is a large caliber vessel that courses the apex. There is a severe mid stenosis just after the first diagonal branch. The mid vessel is then occluded  beyond the third diagonal branch. The distal LAD fills from left to left collaterals.  2. The Circumflex is a large caliber vessel. There is mild diffuse disease. The second obtuse marginal branch appears to be small in caliber and is chronically occluded, filling from left to left collaterals.  4. The RCA is a large dominant vessel with a long segment of chronic occlusion in the mid vessel. The distal vessel fills from right to right collaterals and left to right collaterals.  5. LVEDP 15 mmHg  Recommendations: He has chronic occlusion of the LAD and the RCA. He will need medical management of his CAD for now with ASA, statin and a beta blocker. I will have our CTO team review the films and give [place] direction on the possibility of PCI of the RCA and LAD. If he is not felt to be a candidate for CTO PCI, then we can consider CABG. He has no chest pain. I will let him go home today. I will route this to Dr. Percival Spanish and further planning for revascularization will be discussed as an outpatient.  Past Medical History:  Diagnosis Date  . Cancer ([place])    penile cancer  . Chronic pain of right knee   . Coronary artery disease   . Hypertension   . Phimosis     Past Surgical History:  Procedure Laterality Date  . CIRCUMCISION N/A 10/12/2018   Procedure: CIRCUMCISION ADULT;  Surgeon: Franchot Gallo, MD;  Location: [facility];  Service: Urology;  Laterality: N/A;  45 mins  . COLONOSCOPY    . CORONARY ARTERY BYPASS GRAFT N/A 12/12/2018   Procedure: CORONARY ARTERY BYPASS GRAFTING (CABG) x four, using bilateral internal mammary arteries and right leg greater saphenous vein harvested endoscopically;  Surgeon: [name], MD;  Location: [place];  Service: Open Heart Surgery;  Laterality: N/A;  . LEFT HEART CATH AND CORONARY ANGIOGRAPHY N/A 11/28/2018   Procedure: LEFT HEART CATH AND CORONARY ANGIOGRAPHY;  Surgeon: Burnell Blanks, MD;  Location: [place] [facility];  Service: Cardiovascular;   Laterality: N/A;  . MEDIASTINAL EXPLORATION N/A 12/12/2018   Procedure: Mediastinal Re-Exploration after bypass graft;  Surgeon: [name], MD;  Location: [facility];  Service: Open Heart Surgery;  Laterality: N/A;  . TEE WITHOUT CARDIOVERSION N/A 12/12/2018   Procedure: TRANSESOPHAGEAL ECHOCARDIOGRAM (TEE);  Surgeon: [name], MD;  Location: [facility];  Service: Open Heart Surgery;  Laterality: N/A;  . TOOTH EXTRACTION    . TOTAL HIP ARTHROPLASTY Right 03/08/2019   Procedure: RIGHT TOTAL HIP ARTHROPLASTY ANTERIOR APPROACH;  Surgeon: Mcarthur Rossetti, MD;  Location: [facility];  Service: Orthopedics;  Laterality: Right;    MEDICATIONS: . aspirin EC 81 MG tablet  . atorvastatin (LIPITOR) 40 MG tablet  . carvedilol (COREG) 6.25 MG tablet  . digoxin (LANOXIN) 0.125 MG tablet  . furosemide (LASIX) 20 MG tablet  . Multiple Vitamins-Iron (MULTIVITAMINS WITH IRON) TABS tablet  . nitroGLYCERIN (NITROSTAT) 0.4 MG SL tablet  . oxyCODONE (ROXICODONE) 5 MG immediate release tablet  . pantoprazole (PROTONIX) 20 MG tablet  . sacubitril-valsartan (ENTRESTO) 49-51 MG  . spironolactone (ALDACTONE) 25 MG tablet  . traZODone (  DESYREL) 50 MG tablet   No current facility-administered medications for this encounter.    Maia Plan [facility] Pre-Surgical Testing [phone] 04/24/19  10:38 AM

## 2019-04-25 ENCOUNTER — Other Ambulatory Visit (HOSPITAL_COMMUNITY)
Admission: RE | Admit: 2019-04-25 | Discharge: 2019-04-25 | Disposition: A | Discharge status: Home / Self Care | Payer: HRSA Program | Source: Ambulatory Visit | Attending: Urology | Admitting: Urology

## 2019-04-25 DIAGNOSIS — Z01812 Encounter for preprocedural laboratory examination: Secondary | ICD-10-CM | POA: Insufficient documentation

## 2019-04-25 DIAGNOSIS — Z20822 Contact with and (suspected) exposure to covid-19: Secondary | ICD-10-CM | POA: Diagnosis not present

## 2019-04-25 LAB — SARS CORONAVIRUS 2 (TAT 6-24 HRS): SARS Coronavirus 2: NEGATIVE

## 2019-04-28 NOTE — H&P (Signed)
H&P  Chief Complaint: Penile cancer   History of Present Illness: 64 yo male presents for laser ablation of penile lesions following circumcision 09/2018. 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 He had several small areas of probable persistent cancer at followup. This procedure has been delayed d/t hip arthroplasty in Feb of this  Year. Recent CT A/P showed no evidence of metastatic disease.  Past Medical History:  Diagnosis Date  . Cancer ([place])    penile cancer  . Chronic pain of right knee   . Coronary artery disease   . Hypertension   . Phimosis     Past Surgical History:  Procedure Laterality Date  . CIRCUMCISION N/A 10/12/2018   Procedure: CIRCUMCISION ADULT;  Surgeon: Franchot Gallo, MD;  Location: [facility];  Service: Urology;  Laterality: N/A;  45 mins  . COLONOSCOPY    . CORONARY ARTERY BYPASS GRAFT N/A 12/12/2018   Procedure: CORONARY ARTERY BYPASS GRAFTING (CABG) x four, using bilateral internal mammary arteries and right leg greater saphenous vein harvested endoscopically;  Surgeon: [name], MD;  Location: [facility];  Service: Open Heart Surgery;  Laterality: N/A;  . LEFT HEART CATH AND CORONARY ANGIOGRAPHY N/A 11/28/2018   Procedure: LEFT HEART CATH AND CORONARY ANGIOGRAPHY;  Surgeon: Burnell Blanks, MD;  Location: [facility];  Service: Cardiovascular;  Laterality: N/A;  . MEDIASTINAL EXPLORATION N/A 12/12/2018   Procedure: Mediastinal Re-Exploration after bypass graft;  Surgeon: [name], MD;  Location: [facility];  Service: Open Heart Surgery;  Laterality: N/A;  . TEE WITHOUT CARDIOVERSION N/A 12/12/2018   Procedure: TRANSESOPHAGEAL ECHOCARDIOGRAM (TEE);  Surgeon: [name], MD;  Location: [facility];  Service: Open Heart Surgery;  Laterality: N/A;  . TOOTH EXTRACTION    . TOTAL HIP ARTHROPLASTY Right 03/08/2019   Procedure: RIGHT TOTAL HIP ARTHROPLASTY ANTERIOR APPROACH;  Surgeon: Mcarthur Rossetti, MD;   [facility];  Service: Orthopedics;  Laterality: Right;    Home Medications:  Allergies as of 04/28/2019   No Known Allergies     Medication List    Notice   Cannot display discharge medications because the patient has not yet been admitted.     Allergies: No Known Allergies  Family History  Problem Relation Age of Onset  . Diabetes Mother   . Heart disease Mother     Social History:  reports that he has never smoked. He has never used smokeless tobacco. He reports that he does not drink alcohol or use drugs.  ROS: A complete review of systems was performed.  All systems are negative except for pertinent findings as noted.  Physical Exam:  Vital signs in last 24 hours: There were no vitals taken for this visit. Constitutional:  Alert and oriented, No acute distress Cardiovascular: Regular rate  Respiratory: Normal respiratory effort GI: Abdomen is soft, nontender, nondistended, no abdominal masses. No CVAT.  Genitourinary: Circumcised phallus, partially buried with velvety lesion at glanular ridge.estes are descended bilaterally and non-tender and without masses, scrotum is normal in appearance without lesions or masses, perineum is normal on inspection. Lymphatic: No lymphadenopathy Neurologic: Grossly intact, no focal deficits Psychiatric: Normal mood and affect  Laboratory Data:  No results for input(s): WBC, HGB, HCT, PLT in the last 72 hours.  No results for input(s): NA, K, CL, GLUCOSE, BUN, CALCIUM, CREATININE in the last 72 hours.  Invalid input(s): CO3   No results found for this or any previous visit (from the past  24 hour(s)). Recent Results (from the past 240 hour(s))  SARS CORONAVIRUS 2 (TAT 6-24 HRS) Nasopharyngeal Nasopharyngeal Swab     Status: None   Collection Time: 04/25/19  8:20 AM   Specimen: Nasopharyngeal Swab  Result Value Ref Range Status   SARS Coronavirus 2 NEGATIVE NEGATIVE Final    Comment: (NOTE) SARS-CoV-2 target nucleic acids  are NOT DETECTED. The SARS-CoV-2 RNA is generally detectable in upper and lower respiratory specimens during the acute phase of infection. Negative results do not preclude SARS-CoV-2 infection, do not rule out co-infections with other pathogens, and should not be used as the sole basis for treatment or other patient management decisions. Negative results must be combined with clinical observations, patient history, and epidemiological information. The expected result is Negative. Fact Sheet for Patients: SugarRoll.be Fact Sheet for Healthcare Providers: https://www.woods-mathews.com/ This test is not yet approved or cleared by the [place] FDA and  has been authorized for detection and/or diagnosis of SARS-CoV-2 by FDA under an Emergency Use Authorization (EUA). This EUA will remain  in effect (meaning this test can be used) for the duration of the COVID-19 declaration under Section 56 4(b)(1) of the Act, 21 U.S.C. section 360bbb-3(b)(1), unless the authorization is terminated or revoked sooner. Performed at [facility], [place] [address] [place], [place] [address]     Renal Function: No results for input(s): CREATININE in the last 168 hours. Estimated Creatinine Clearance: 112.7 mL/min (by C-G formula based on SCr of 0.89 mg/dL).  Radiologic Imaging: No results found.  Impression/Assessment:  Penile carcinoma.  Plan:  Laser ablation penile carcinoma.

## 2019-04-29 ENCOUNTER — Ambulatory Visit (HOSPITAL_COMMUNITY)
Admission: RE | Admit: 2019-04-29 | Discharge: 2019-04-29 | Disposition: A | Discharge status: Home / Self Care | Payer: Self-pay | Attending: Urology | Admitting: Urology

## 2019-04-29 ENCOUNTER — Ambulatory Visit (HOSPITAL_COMMUNITY): Payer: Self-pay | Admitting: Physician Assistant

## 2019-04-29 ENCOUNTER — Encounter (HOSPITAL_COMMUNITY)
Admission: RE | Disposition: A | Discharge status: Home / Self Care | Payer: Self-pay | Source: Home / Self Care | Attending: Urology

## 2019-04-29 ENCOUNTER — Telehealth: Payer: Self-pay

## 2019-04-29 ENCOUNTER — Encounter (HOSPITAL_COMMUNITY): Payer: Self-pay | Admitting: Urology

## 2019-04-29 ENCOUNTER — Other Ambulatory Visit: Payer: Self-pay

## 2019-04-29 ENCOUNTER — Ambulatory Visit (HOSPITAL_COMMUNITY): Payer: Self-pay | Admitting: Anesthesiology

## 2019-04-29 DIAGNOSIS — I10 Essential (primary) hypertension: Secondary | ICD-10-CM | POA: Insufficient documentation

## 2019-04-29 DIAGNOSIS — D074 Carcinoma in situ of penis: Secondary | ICD-10-CM | POA: Insufficient documentation

## 2019-04-29 DIAGNOSIS — Z8249 Family history of ischemic heart disease and other diseases of the circulatory system: Secondary | ICD-10-CM | POA: Insufficient documentation

## 2019-04-29 DIAGNOSIS — Z951 Presence of aortocoronary bypass graft: Secondary | ICD-10-CM | POA: Insufficient documentation

## 2019-04-29 DIAGNOSIS — I2510 Atherosclerotic heart disease of native coronary artery without angina pectoris: Secondary | ICD-10-CM | POA: Insufficient documentation

## 2019-04-29 DIAGNOSIS — C609 Malignant neoplasm of penis, unspecified: Secondary | ICD-10-CM | POA: Insufficient documentation

## 2019-04-29 DIAGNOSIS — M1990 Unspecified osteoarthritis, unspecified site: Secondary | ICD-10-CM | POA: Insufficient documentation

## 2019-04-29 HISTORY — PX: CO2 LASER APPLICATION: SHX5778

## 2019-04-29 SURGERY — CO2 LASER APPLICATION
Anesthesia: Monitor Anesthesia Care

## 2019-04-29 MED ORDER — FENTANYL CITRATE (PF) 100 MCG/2ML IJ SOLN
INTRAMUSCULAR | Status: DC | PRN
  Administered 2019-04-29: 50 ug via INTRAVENOUS
  Administered 2019-04-29: 25 ug via INTRAVENOUS

## 2019-04-29 MED ORDER — OXYCODONE HCL 5 MG PO TABS: 5.0000 mg | ORAL_TABLET | Freq: Once | ORAL | Status: DC | PRN

## 2019-04-29 MED ORDER — PROPOFOL 500 MG/50ML IV EMUL
INTRAVENOUS | Status: DC | PRN
  Administered 2019-04-29: 100 ug/kg/min via INTRAVENOUS

## 2019-04-29 MED ORDER — MIDAZOLAM HCL 5 MG/5ML IJ SOLN
INTRAMUSCULAR | Status: DC | PRN
  Administered 2019-04-29: 1 mg via INTRAVENOUS

## 2019-04-29 MED ORDER — CEFAZOLIN SODIUM-DEXTROSE 2-4 GM/100ML-% IV SOLN
2.0000 g | INTRAVENOUS | Status: AC
  Administered 2019-04-29: 08:00:00 2 g via INTRAVENOUS
  Filled 2019-04-29: qty 100

## 2019-04-29 MED ORDER — PROPOFOL 500 MG/50ML IV EMUL
INTRAVENOUS | Status: AC
  Filled 2019-04-29: qty 50

## 2019-04-29 MED ORDER — OXYCODONE HCL 5 MG/5ML PO SOLN: 5.0000 mg | Freq: Once | ORAL | Status: DC | PRN

## 2019-04-29 MED ORDER — MIDAZOLAM HCL 2 MG/2ML IJ SOLN
INTRAMUSCULAR | Status: AC
  Filled 2019-04-29: qty 2

## 2019-04-29 MED ORDER — FENTANYL CITRATE (PF) 100 MCG/2ML IJ SOLN: 25.0000 ug | INTRAMUSCULAR | Status: DC | PRN

## 2019-04-29 MED ORDER — ONDANSETRON HCL 4 MG/2ML IJ SOLN
INTRAMUSCULAR | Status: DC | PRN
  Administered 2019-04-29: 4 mg via INTRAVENOUS

## 2019-04-29 MED ORDER — LACTATED RINGERS IV SOLN: INTRAVENOUS | Status: DC

## 2019-04-29 MED ORDER — ONDANSETRON HCL 4 MG/2ML IJ SOLN
INTRAMUSCULAR | Status: AC
  Filled 2019-04-29: qty 2

## 2019-04-29 MED ORDER — ACETAMINOPHEN 160 MG/5ML PO SOLN: 325.0000 mg | ORAL | Status: DC | PRN

## 2019-04-29 MED ORDER — FENTANYL CITRATE (PF) 100 MCG/2ML IJ SOLN
INTRAMUSCULAR | Status: AC
  Filled 2019-04-29: qty 2

## 2019-04-29 MED ORDER — ONDANSETRON HCL 4 MG/2ML IJ SOLN: 4.0000 mg | Freq: Once | INTRAMUSCULAR | Status: DC | PRN

## 2019-04-29 MED ORDER — MEPERIDINE HCL 50 MG/ML IJ SOLN: 6.2500 mg | INTRAMUSCULAR | Status: DC | PRN

## 2019-04-29 MED ORDER — PROPOFOL 10 MG/ML IV BOLUS
INTRAVENOUS | Status: DC | PRN
  Administered 2019-04-29: 40 mg via INTRAVENOUS

## 2019-04-29 MED ORDER — STERILE WATER FOR IRRIGATION IR SOLN
Status: DC | PRN
  Administered 2019-04-29: 1000 mL

## 2019-04-29 MED ORDER — ACETAMINOPHEN 325 MG PO TABS: 325.0000 mg | ORAL_TABLET | ORAL | Status: DC | PRN

## 2019-04-29 MED ORDER — LIDOCAINE 2% (20 MG/ML) 5 ML SYRINGE
INTRAMUSCULAR | Status: AC
  Filled 2019-04-29: qty 5

## 2019-04-29 MED ORDER — BUPIVACAINE HCL 0.25 % IJ SOLN
INTRAMUSCULAR | Status: AC
  Filled 2019-04-29: qty 1

## 2019-04-29 MED ORDER — BUPIVACAINE HCL 0.25 % IJ SOLN
INTRAMUSCULAR | Status: DC | PRN
  Administered 2019-04-29: 20 mL

## 2019-04-29 SURGICAL SUPPLY — 17 items
BNDG CONFORM 2 STRL LF (GAUZE/BANDAGES/DRESSINGS) ×6 IMPLANT
CLOTH BEACON ORANGE TIMEOUT ST (SAFETY) ×3 IMPLANT
COVER WAND RF STERILE (DRAPES) ×3 IMPLANT
DEPRESSOR TONGUE BLADE STERILE (MISCELLANEOUS) ×3 IMPLANT
GAUZE PETROLATUM 1 X8 (GAUZE/BANDAGES/DRESSINGS) ×6 IMPLANT
GLOVE BIO SURGEON STRL SZ8 (GLOVE) ×3 IMPLANT
GOWN STRL REUS W/ TWL LRG LVL3 (GOWN DISPOSABLE) ×1 IMPLANT
GOWN STRL REUS W/ TWL XL LVL3 (GOWN DISPOSABLE) ×1 IMPLANT
GOWN STRL REUS W/TWL LRG LVL3 (GOWN DISPOSABLE) ×3
GOWN STRL REUS W/TWL XL LVL3 (GOWN DISPOSABLE) ×3
KIT BASIN OR (CUSTOM PROCEDURE TRAY) ×3 IMPLANT
KIT TURNOVER KIT A (KITS) ×3 IMPLANT
MANIFOLD NEPTUNE II (INSTRUMENTS) IMPLANT
TOWEL OR 17X26 10 PK STRL BLUE (TOWEL DISPOSABLE) ×6 IMPLANT
TUBING CONNECTING 10 (TUBING) IMPLANT
TUBING CONNECTING 10' (TUBING)
VACUUM HOSE 7/8X10 W/ WAND (MISCELLANEOUS) ×3 IMPLANT

## 2019-04-29 NOTE — Transfer of Care (Signed)
Immediate Anesthesia Transfer of Care Note  Patient: Frank Love  Procedure(s) Performed: CO2 LASER APPLICATION (N/A )  Patient Location: PACU  Anesthesia Type:MAC  Level of Consciousness: awake, alert  and oriented  Airway & Oxygen Therapy: Patient Spontanous Breathing  Post-op Assessment: Report given to RN and Post -op Vital signs reviewed and stable  Post vital signs: Reviewed and stable  Last Vitals:  Vitals Value Taken Time  BP    Temp    Pulse 97 04/29/19 0854  Resp 18 04/29/19 0854  SpO2 95 % 04/29/19 0854  Vitals shown include unvalidated device data.  Last Pain:  Vitals:   04/29/19 0700  TempSrc:   PainSc: 0-No pain      Patients Stated Pain Goal: 4 (04/88/89 1694)  Complications: No apparent anesthesia complications

## 2019-04-29 NOTE — Telephone Encounter (Signed)
-----   Message from Franchot Gallo, MD sent at 04/25/2019  9:51 AM EDT ----- I sent a task thru Urochart on this guy ----- Message ----- From: Dorisann Frames, RN Sent: 04/24/2019   8:38 AM EDT To: Franchot Gallo, MD  Please review

## 2019-04-29 NOTE — Op Note (Signed)
Preoperative diagnosis: Carcinoma in situ of penis, status post circumcision  Postoperative diagnosis: Same  Principal procedure: CO2 laser application to residual penile carcinoma  Surgeon: Eilene Voigt  Anesthesia: MAC with local, quarter percent plain Marcaine  Specimen: None  Estimated blood loss: Less than 5 mL  Complications: None  Indications: 64 year old male status post circumcision for severe phimosis in September, 2020.  The patient had incidental finding of carcinoma of the penis/CIS on the specimen.  Significant abnormality detected during his circumcision once the foreskin was retracted following incision.  He presents at this time for laser application to any residual penile carcinoma.  Recent CT of the abdomen and pelvis revealed no evidence of metastatic disease within the abdomen or pelvis.  There are no palpable inguinal lymph nodes.  I discussed the procedure with the patient who understands the process and desires to proceed.  Description of procedure: The patient was properly identified in the holding area.  He is taken to the operating room where he was placed on the operating table in the supine position.  Monitored anesthesia care was performed with sedation.  The patient's penis and genitalia were prepped and draped with wet towels.  Timeout was performed.  Careful inspection was performed of the penis.  The distal penile skin was pulled taut, especially at the dorsum of the penis.  A small amount of velvety tissue was seen.  Additionally, one spot of abnormal epithelium was seen in the patient's periurethral area, approximately at the 4 to 5 o'clock position.  Total area that need to be treated was approximately 2 cm.  Following this, penile block was established using 20 mL of 0.25% plain Marcaine.  Laser energy using the CO2 laser was then applied to the abnormal epithelial areas.  Energy was set at 20.  Following initial application and burning, a wet sponge was used  to abrade the eschar, and this was again treated such that all abnormal epithelium was ablated.  At this point, the procedure was terminated.  I then placed a Vaseline gauze followed by a 1 inch Kerlix around the patient's penis.  This was taped in place.  The patient tolerated the procedure well.  He was then taken to the PACU in stable condition.

## 2019-04-29 NOTE — Interval H&P Note (Signed)
History and Physical Interval Note:  04/29/2019 7:26 AM  Frank Love  has presented today for surgery, with the diagnosis of PENILE CANCER.  The various methods of treatment have been discussed with the patient and family. After consideration of risks, benefits and other options for treatment, the patient has consented to  Procedure(s) with comments: CO2 LASER APPLICATION (N/A) - 30 MINS as a surgical intervention.  The patient's history has been reviewed, patient examined, no change in status, stable for surgery.  I have reviewed the patient's chart and labs.  Questions were answered to the patient's satisfaction.     Lillette Boxer Iesha Summerhill

## 2019-04-29 NOTE — Anesthesia Postprocedure Evaluation (Signed)
Anesthesia Post Note  Patient: Frank Love  Procedure(s) Performed: CO2 LASER APPLICATION (N/A )     Patient location during evaluation: PACU Anesthesia Type: MAC Level of consciousness: awake and alert Pain management: pain level controlled Vital Signs Assessment: post-procedure vital signs reviewed and stable Respiratory status: spontaneous breathing, nonlabored ventilation, respiratory function stable and patient connected to nasal cannula oxygen Cardiovascular status: stable and blood pressure returned to baseline Postop Assessment: no apparent nausea or vomiting Anesthetic complications: no    Last Vitals:  Vitals:   04/29/19 0643 04/29/19 0853  BP: 137/69 (!) 95/48  Pulse: 64 96  Resp: 18 15  Temp: 36.9 C 36.8 C  SpO2: 100% 96%    Last Pain:  Vitals:   04/29/19 0853  TempSrc:   PainSc: 0-No pain                 Shailene Demonbreun,Manual

## 2019-04-29 NOTE — Discharge Instructions (Signed)
It is okay to remove the bandage on Tuesday morning if it does not fall off before then  You may place Neosporin or Vaseline on the treated area until it heals.  It is okay to take Tylenol or ibuprofen for any pain  It is okay to begin washing with showering on Tuesday  Call the office for any wound issues

## 2019-04-30 ENCOUNTER — Telehealth: Payer: Self-pay | Admitting: Nurse Practitioner

## 2019-04-30 ENCOUNTER — Other Ambulatory Visit (HOSPITAL_COMMUNITY): Payer: Self-pay

## 2019-04-30 NOTE — Telephone Encounter (Signed)
Patient is calling to check on his CAFA application. Please f/u

## 2019-05-01 NOTE — Telephone Encounter (Signed)
Pt. returned call to office and writer made him aware ct showed no evidence of cancer in abd/pelvic area.

## 2019-05-01 NOTE — Progress Notes (Signed)
Cardiology Office Note   Date:  05/02/2019   ID:  RJ STARZYNSKI, DOB [birth year], MRN [id number]  PCP:  [name], NP  Cardiologist:   [name], MD   Chief Complaint  Patient presents with  . Coronary Artery Disease      History of Present Illness: Frank Love is a 64 y.o. male who presents for follow up of CAD and cardiomyopathy.  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 He had reduced RV function.   Echo in March was 35 - 40%.  RV systolic function was mildly reduced.   Since I last saw him he had laser treatment of penile carcinoma.   He has had meds titrated by Dr. Aundra Dubin.  He was to get an MRI.     He says he has been feeling okay.  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  He finally has hip surgery, his bypass and his other surgery as above.  He is not having any chest pressure, neck or arm discomfort.  He is breathing without difficulty.  He is not describing PND or orthopnea.  He had some mild lower extremity swelling which is baseline.  He did have his Entresto increased at the last visit.  He tolerated this well.  He had labs scheduled for tomorrow.    Past Medical History:  Diagnosis Date  . Cancer ([place])    penile cancer  . Chronic pain of right knee   . Coronary artery disease   . Hypertension   . Phimosis     Past Surgical History:  Procedure Laterality Date  . CIRCUMCISION N/A 10/12/2018   Procedure: CIRCUMCISION ADULT;  Surgeon: Franchot Gallo, MD;  Location[facility];  Service: Urology;  Laterality: N/A;  45 mins  . CO2 LASER APPLICATION N/A [id number]   Procedure: CO2 LASER APPLICATION;  Surgeon: Franchot Gallo, MD;  [facility];  Service: Urology;  Laterality: N/A;  30 MINS  . COLONOSCOPY    . CORONARY ARTERY BYPASS GRAFT N/A 12/12/2018   Procedure: CORONARY ARTERY BYPASS GRAFTING (CABG) x four, using bilateral internal mammary arteries and right leg greater saphenous vein  harvested endoscopically;  Surgeon: [name], MD;  Location: [facility];  Service: Open Heart Surgery;  Laterality: N/A;  . LEFT HEART CATH AND CORONARY ANGIOGRAPHY N/A 11/28/2018   Procedure: LEFT HEART CATH AND CORONARY ANGIOGRAPHY;  Surgeon: Burnell Blanks, MD;  Location: [facility];  Service: Cardiovascular;  Laterality: N/A;  . MEDIASTINAL EXPLORATION N/A 12/12/2018   Procedure: Mediastinal Re-Exploration after bypass graft;  Surgeon: [name], MD;  Location: [facility];  Service: Open Heart Surgery;  Laterality: N/A;  . TEE WITHOUT CARDIOVERSION N/A 12/12/2018   Procedure: TRANSESOPHAGEAL ECHOCARDIOGRAM (TEE);  Surgeon: [name], MD;  Location: [facility];  Service: Open Heart Surgery;  Laterality: N/A;  . TOOTH EXTRACTION    . TOTAL HIP ARTHROPLASTY Right 03/08/2019   Procedure: RIGHT TOTAL HIP ARTHROPLASTY ANTERIOR APPROACH;  Surgeon: Mcarthur Rossetti, MD;  [facility];  Service: Orthopedics;  Laterality: Right;     Current Outpatient Medications  Medication Sig Dispense Refill  . aspirin EC 81 MG tablet Take 81 mg by mouth at bedtime.     Marland Kitchen atorvastatin (LIPITOR) 40 MG tablet Take 1 tablet (40 mg total) by mouth daily at 6 PM. 30 tablet 5  . carvedilol (COREG) 6.25 MG tablet Take  1 tablet (6.25 mg total) by mouth 2 (two) times daily with a meal. 60 tablet 3  . digoxin (LANOXIN) 0.125 MG tablet Take 1 tablet (0.125 mg total) by mouth daily. 30 tablet 5  . furosemide (LASIX) 20 MG tablet Take 1 tablet (20 mg total) by mouth daily. 90 tablet 3  . Multiple Vitamins-Iron (MULTIVITAMINS WITH IRON) TABS tablet Take 1 tablet by mouth daily. 30 tablet 1  . nitroGLYCERIN (NITROSTAT) 0.4 MG SL tablet Place 1 tablet (0.4 mg total) under the tongue every 5 (five) minutes as needed for chest pain. NO more than 3 pills--if you have the need to take 2 pills or more call MD 30 tablet 0  . pantoprazole (PROTONIX) 20 MG tablet Take 1 tablet (20 mg total) by mouth  daily. 30 tablet 5  . spironolactone (ALDACTONE) 25 MG tablet Take 1 tablet (25 mg total) by mouth daily. 30 tablet 3  . traZODone (DESYREL) 50 MG tablet Take 0.5 tablets (25 mg total) by mouth at bedtime as needed for sleep. 30 tablet 0  . sacubitril-valsartan (ENTRESTO) 97-103 MG Take 1 tablet by mouth 2 (two) times daily. 60 tablet 11   No current facility-administered medications for this visit.    Allergies:   Patient has no known allergies.    ROS:  Please see the history of present illness.   Otherwise, review of systems are positive for none.   All other systems are reviewed and negative.    PHYSICAL EXAM: VS:  BP 130/78   Pulse (!) 56   Temp 97.9 F (36.6 C)   Ht 6\' 2"  (1.88 m)   Wt 242 lb 6.4 oz (110 kg)   SpO2 98%   BMI 31.12 kg/m  , BMI Body mass index is 31.12 kg/m. GENERAL:  Well appearing NECK:  No jugular venous distention, waveform within normal limits, carotid upstroke brisk and symmetric, no bruits, no thyromegaly LUNGS:  Clear to auscultation bilaterally CHEST:  Well healed sternotomy scar. HEART:  PMI not displaced or sustained,S1 and S2 within normal limits, no S3, no S4, no clicks, no rubs, no murmurs ABD:  Flat, positive bowel sounds normal in frequency in pitch, no bruits, no rebound, no guarding, no midline pulsatile mass, no hepatomegaly, no splenomegaly EXT:  2 plus pulses throughout, mild ankle edema, no cyanosis no clubbing   EKG:  EKG is  ordered today. Sinus rhythm, rate 56, left axis deviation, old inferior infarct, old anteroseptal infarct, no acute ST-T wave changes, premature atrial contractions.  Lateral T wave inversions and inferior T wave inversions unchanged from previous.   Recent Labs: 08/03/2018: BNP 316.2 12/13/2018: Magnesium 2.3 12/22/2018: ALT 22 04/18/2019: BUN 23; Creatinine, Ser 0.89; Hemoglobin 11.4; Platelets 187; Potassium 4.3; Sodium 140    Lipid Panel    Component Value Date/Time   CHOL 105 03/18/2019 1007   CHOL  182 08/03/2018 1024   TRIG 51 03/18/2019 1007   HDL 34 (L) 03/18/2019 1007   HDL 53 08/03/2018 1024   CHOLHDL 3.1 03/18/2019 1007   VLDL 10 03/18/2019 1007   LDLCALC 61 03/18/2019 1007   LDLCALC 116 (H) 08/03/2018 1024      Wt Readings from Last 3 Encounters:  05/02/19 242 lb 6.4 oz (110 kg)  04/29/19 241 lb (109.3 kg)  04/23/19 241 lb (109.3 kg)     Intervention    Other studies Reviewed: Additional studies/ records that were reviewed today include: Labs, urology Op note and CHF Clinic notes Review of the  above records demonstrates:  NA  ASSESSMENT AND PLAN:  CABG:       He has done well in respect to this.  No change in therapy.  No further symptoms.  He will continue with risk reduction.   CAROTID STENOSIS:   We will put him in the schedule for follow-up in November.  He had moderate disease last year.  ISCHEMIC CARDIOMYOPATHY:   I do think he can titrate his Entresto to 97/103 twice daily.  He has lots of the 24/26 is I think it is okay to take for these twice a day until he uses them up.  He has follow-up in the [facility] and I think he does not need to see both of [place] so I will offer him to transfer his care completely to Dr. Aundra Dubin.  He will check a basic metabolic profile and will do the blood work today and again in a couple of weeks with the med titration.   COVID EDUCATION: I talked him about the vaccine and we will give him information to sign up.  He was a little bit hesitant but he would not take it.  Current medicines are reviewed at length with the patient today.  The patient does not have concerns regarding medicines.  The following changes have been made:  As above Labs/ tests ordered today include:   Orders Placed This Encounter  Procedures  . Basic metabolic panel  . Basic metabolic panel  . EKG 12-Lead  . VAS US CAROTID     Disposition:   FU with HF clinic    Signed, [name], MD  05/02/2019 10:33 AM    [place]

## 2019-05-01 NOTE — Telephone Encounter (Signed)
Called pt and mailed/ scanned letter in documents. Pt is aware and had no further questions or concerns.

## 2019-05-02 ENCOUNTER — Ambulatory Visit (INDEPENDENT_AMBULATORY_CARE_PROVIDER_SITE_OTHER): Payer: Self-pay | Admitting: Cardiology

## 2019-05-02 ENCOUNTER — Other Ambulatory Visit: Payer: Self-pay

## 2019-05-02 ENCOUNTER — Encounter: Payer: Self-pay | Admitting: Cardiology

## 2019-05-02 VITALS — BP 130/78 | HR 56 | Temp 97.9°F | Ht 74.0 in | Wt 242.4 lb

## 2019-05-02 DIAGNOSIS — I2510 Atherosclerotic heart disease of native coronary artery without angina pectoris: Secondary | ICD-10-CM

## 2019-05-02 DIAGNOSIS — Z79899 Other long term (current) drug therapy: Secondary | ICD-10-CM

## 2019-05-02 DIAGNOSIS — I350 Nonrheumatic aortic (valve) stenosis: Secondary | ICD-10-CM

## 2019-05-02 DIAGNOSIS — I255 Ischemic cardiomyopathy: Secondary | ICD-10-CM

## 2019-05-02 DIAGNOSIS — Z7189 Other specified counseling: Secondary | ICD-10-CM

## 2019-05-02 LAB — BASIC METABOLIC PANEL
BUN/Creatinine Ratio: 29 — ABNORMAL HIGH (ref 10–24)
BUN: 27 mg/dL (ref 8–27)
CO2: 23 mmol/L (ref 20–29)
Calcium: 9.1 mg/dL (ref 8.6–10.2)
Chloride: 107 mmol/L — ABNORMAL HIGH (ref 96–106)
Creatinine, Ser: 0.92 mg/dL (ref 0.76–1.27)
GFR calc Af Amer: 102 mL/min/{1.73_m2} (ref >59)
GFR calc non Af Amer: 88 mL/min/{1.73_m2} (ref >59)
Glucose: 94 mg/dL (ref 65–99)
Potassium: 4.1 mmol/L (ref 3.5–5.2)
Sodium: 145 mmol/L — ABNORMAL HIGH (ref 134–144)

## 2019-05-02 MED ORDER — ENTRESTO 97-103 MG PO TABS: 1.0000 | ORAL_TABLET | Freq: Two times a day (BID) | ORAL | 11 refills | Status: DC

## 2019-05-02 MED FILL — ENTRESTO 97 MG-103 MG TAB: 97-103 | 30 days supply | Qty: 60 | Fill #0

## 2019-05-02 NOTE — Patient Instructions (Addendum)
Medication Instructions:  Increase Entresto to 97-103. Take 1 tablet twice a day. *If you need a refill on your cardiac medications before your next appointment, please call your pharmacy*   Lab Work: Your physician recommends that you return for lab work today (BMP) Your physician recommends that you return for lab work in 2 weeks (BMP)  If you have labs (blood work) drawn today and your tests are completely normal, you will receive your results only by: Marland Kitchen MyChart Message (if you have MyChart) OR . A paper copy in the mail If you have any lab test that is abnormal or we need to change your treatment, we will call you to review the results.   Testing/Procedures: Your physician has requested that you have a carotid duplex in November. This test is an ultrasound of the carotid arteries in your neck. It looks at blood flow through these arteries that supply the brain with blood. Allow one hour for this exam. There are no restrictions or special instructions.   Follow-Up: At [facility], you and your health needs are our priority.  As part of our continuing mission to provide you with exceptional heart care, we have created designated Provider Care Teams.  These Care Teams include your primary Cardiologist (physician) and Advanced Practice Providers (APPs -  Physician Assistants and Nurse Practitioners) who all work together to provide you with the care you need, when you need it.  We recommend signing up for the patient portal called "MyChart".  Sign up information is provided on this After Visit Summary.  MyChart is used to connect with patients for Virtual Visits (Telemedicine).  Patients are able to view lab/test results, encounter notes, upcoming appointments, etc.  Non-urgent messages can be sent to your provider as well.   To learn more about what you can do with MyChart, go to [email or web address].    Your next appointment:   Follow up with Dr. Marigene Ehlers - Heart Failure  Clinic  Other Instructions: Visit [email or web address] to sign up for your covid vaccine

## 2019-05-03 ENCOUNTER — Other Ambulatory Visit (HOSPITAL_COMMUNITY): Payer: Self-pay

## 2019-05-06 ENCOUNTER — Encounter: Payer: Self-pay | Admitting: Cardiothoracic Surgery

## 2019-05-06 ENCOUNTER — Other Ambulatory Visit: Payer: Self-pay

## 2019-05-06 ENCOUNTER — Ambulatory Visit (INDEPENDENT_AMBULATORY_CARE_PROVIDER_SITE_OTHER): Payer: Self-pay | Admitting: Cardiothoracic Surgery

## 2019-05-06 VITALS — BP 128/63 | HR 70 | Temp 98.4°F | Resp 16 | Ht 74.0 in | Wt 242.0 lb

## 2019-05-06 DIAGNOSIS — I2510 Atherosclerotic heart disease of native coronary artery without angina pectoris: Secondary | ICD-10-CM

## 2019-05-06 DIAGNOSIS — Z951 Presence of aortocoronary bypass graft: Secondary | ICD-10-CM

## 2019-05-07 NOTE — Progress Notes (Signed)
[facility] [address]       [place] [address]             [phone]     CARDIOTHORACIC SURGERY OFFICE NOTE  Referring Provider is [name], MD Primary Cardiologist is [name], MD PCP is [name], NP   HPI:  64 yo man underwent CABG in Nov 2020 for ICM. He did well and was discharged to inpatient rehab for brief time. Presents now to discuss his medication regimen. He has gone back to work and since doing so has gained weight. He would like to know the best way to reduce the number of meds on his regimen.  Current Outpatient Medications  Medication Sig Dispense Refill  . aspirin EC 81 MG tablet Take 81 mg by mouth at bedtime.     Marland Kitchen atorvastatin (LIPITOR) 40 MG tablet Take 1 tablet (40 mg total) by mouth daily at 6 PM. 30 tablet 5  . carvedilol (COREG) 6.25 MG tablet Take 1 tablet (6.25 mg total) by mouth 2 (two) times daily with a meal. 60 tablet 3  . digoxin (LANOXIN) 0.125 MG tablet Take 1 tablet (0.125 mg total) by mouth daily. 30 tablet 5  . furosemide (LASIX) 20 MG tablet Take 1 tablet (20 mg total) by mouth daily. 90 tablet 3  . Multiple Vitamins-Iron (MULTIVITAMINS WITH IRON) TABS tablet Take 1 tablet by mouth daily. 30 tablet 1  . nitroGLYCERIN (NITROSTAT) 0.4 MG SL tablet Place 1 tablet (0.4 mg total) under the tongue every 5 (five) minutes as needed for chest pain. NO more than 3 pills--if you have the need to take 2 pills or more call MD 30 tablet 0  . pantoprazole (PROTONIX) 20 MG tablet Take 1 tablet (20 mg total) by mouth daily. 30 tablet 5  . sacubitril-valsartan (ENTRESTO) 97-103 MG Take 1 tablet by mouth 2 (two) times daily. 60 tablet 11  . spironolactone (ALDACTONE) 25 MG tablet Take 1 tablet (25 mg total) by mouth daily. 30 tablet 3  . traZODone (DESYREL) 50 MG tablet Take 0.5 tablets (25 mg total) by mouth at bedtime as needed for sleep. 30 tablet 0   No current facility-administered medications for this visit.       Physical Exam:   BP 128/63 (BP Location: Right Arm, Patient Position: Sitting, Cuff Size: Normal)   Pulse 70   Temp 98.4 F (36.9 C)   Resp 16   Ht 6\' 2"  (1.88 m)   Wt 109.8 kg   SpO2 96% Comment: RA  BMI 31.07 kg/m   General:  Well appearing,, NAD  Chest:   cta  CV:   rrr  Incisions:  Well-healed  Abdomen:  sntnd  Extremities:  2+ pretibial edema  Diagnostic Tests:  I have reviewed available imaging including most recent echocardiogram   Impression:  64 yo doing reasonably well  Plan:  I have emphasized that the most likely way for him to reduce medication regimen will be through diet, weight loss, exercise. I have suggested revisiting idea of Cardiac Rehab as a starting point for this. He and wife are in agreement. We will refer to cardiac rehab.   I spent in excess of 20 minutes during the conduct of this office consultation and >50% of this time involved direct face-to-face encounter with the patient for counseling and/or coordination of their care.  Level 2  10 minutes Level 3                 15 minutes Level 4                 25 minutes Level 5                 40 minutes B. Murvin Natal, MD 05/07/2019 4:58 PM

## 2019-05-08 ENCOUNTER — Encounter (HOSPITAL_COMMUNITY): Payer: Self-pay | Admitting: *Deleted

## 2019-05-08 ENCOUNTER — Telehealth (HOSPITAL_COMMUNITY): Payer: Self-pay

## 2019-05-08 ENCOUNTER — Other Ambulatory Visit: Payer: Self-pay | Admitting: *Deleted

## 2019-05-08 DIAGNOSIS — I2510 Atherosclerotic heart disease of native coronary artery without angina pectoris: Secondary | ICD-10-CM

## 2019-05-08 DIAGNOSIS — Z951 Presence of aortocoronary bypass graft: Secondary | ICD-10-CM

## 2019-05-08 NOTE — Progress Notes (Signed)
Received referral from Dr. Orvan Seen for this pt to participate in cardiac rehab s/p CABG x 4 on 12/12/18.  Pt was previously referred but he was unable to participate due the urgent need for Right hip Replacement.  This was completed on 03/08/2019.  Pt has completed his follow up with Dr. Rush Farmer. Clinical review of pt follow up appt on 05/08/19 with Dr. Orvan Seen  CV surgical office note. Pt has also completed follow with Dr. Percival Spanish and Dr. Aundra Dubin. Pt is making the expected progress in recovery.  Pt appropriate for scheduling for Virtual Cardiac Rehab. Pt has medicaid pending.  Pt Covid score is 5.  Will forward to staff for follow up. Cherre Huger, BSN Cardiac and Training and development officer

## 2019-05-08 NOTE — Telephone Encounter (Signed)
Pt Medicaid is still pending.

## 2019-05-09 ENCOUNTER — Telehealth (HOSPITAL_COMMUNITY): Payer: Self-pay

## 2019-05-09 NOTE — Telephone Encounter (Signed)
Attempted to call patient in regards to Cardiac Rehab - LM on VM Mailed letter 

## 2019-05-09 NOTE — Telephone Encounter (Signed)
Pt returned CR phone call, patient will come in for orientation on 05/28/2019 @ 830AM and will attend the 845AM exercise class. Patient verbalized understanding his patient responsibility of 75% with [employer] financial assistance.  Mailed letter

## 2019-05-09 NOTE — Telephone Encounter (Signed)
Pt has a financial assistance with [facility] of 75%.

## 2019-05-10 MED FILL — POTASSIUM CL ER 20 MEQ TABL: 20 | 30 days supply | Qty: 30 | Fill #1

## 2019-05-13 ENCOUNTER — Ambulatory Visit: Payer: Self-pay | Attending: Nurse Practitioner | Admitting: Nurse Practitioner

## 2019-05-13 ENCOUNTER — Other Ambulatory Visit: Payer: Self-pay

## 2019-05-13 ENCOUNTER — Encounter (HOSPITAL_COMMUNITY): Payer: Self-pay

## 2019-05-13 ENCOUNTER — Encounter: Payer: Self-pay | Admitting: Nurse Practitioner

## 2019-05-13 ENCOUNTER — Other Ambulatory Visit (HOSPITAL_COMMUNITY): Payer: Self-pay | Admitting: Cardiology

## 2019-05-13 ENCOUNTER — Encounter: Payer: Self-pay | Admitting: Gastroenterology

## 2019-05-13 ENCOUNTER — Ambulatory Visit (HOSPITAL_COMMUNITY)
Admission: RE | Admit: 2019-05-13 | Discharge: 2019-05-13 | Disposition: A | Discharge status: Home / Self Care | Payer: Self-pay | Source: Ambulatory Visit | Attending: Internal Medicine | Admitting: Internal Medicine

## 2019-05-13 VITALS — BP 124/72 | HR 62 | Temp 97.9°F | Ht 74.0 in | Wt 242.0 lb

## 2019-05-13 VITALS — BP 122/66 | HR 60 | Wt 243.2 lb

## 2019-05-13 DIAGNOSIS — H6123 Impacted cerumen, bilateral: Secondary | ICD-10-CM

## 2019-05-13 DIAGNOSIS — I2510 Atherosclerotic heart disease of native coronary artery without angina pectoris: Secondary | ICD-10-CM | POA: Insufficient documentation

## 2019-05-13 DIAGNOSIS — I6529 Occlusion and stenosis of unspecified carotid artery: Secondary | ICD-10-CM | POA: Insufficient documentation

## 2019-05-13 DIAGNOSIS — D649 Anemia, unspecified: Secondary | ICD-10-CM

## 2019-05-13 DIAGNOSIS — Z951 Presence of aortocoronary bypass graft: Secondary | ICD-10-CM | POA: Insufficient documentation

## 2019-05-13 DIAGNOSIS — Z125 Encounter for screening for malignant neoplasm of prostate: Secondary | ICD-10-CM

## 2019-05-13 DIAGNOSIS — I10 Essential (primary) hypertension: Secondary | ICD-10-CM

## 2019-05-13 DIAGNOSIS — Z79899 Other long term (current) drug therapy: Secondary | ICD-10-CM | POA: Insufficient documentation

## 2019-05-13 DIAGNOSIS — I255 Ischemic cardiomyopathy: Secondary | ICD-10-CM | POA: Insufficient documentation

## 2019-05-13 DIAGNOSIS — Z1159 Encounter for screening for other viral diseases: Secondary | ICD-10-CM

## 2019-05-13 DIAGNOSIS — Z7901 Long term (current) use of anticoagulants: Secondary | ICD-10-CM | POA: Insufficient documentation

## 2019-05-13 DIAGNOSIS — Z114 Encounter for screening for human immunodeficiency virus [HIV]: Secondary | ICD-10-CM

## 2019-05-13 DIAGNOSIS — I5022 Chronic systolic (congestive) heart failure: Secondary | ICD-10-CM | POA: Insufficient documentation

## 2019-05-13 DIAGNOSIS — Z1211 Encounter for screening for malignant neoplasm of colon: Secondary | ICD-10-CM

## 2019-05-13 DIAGNOSIS — I110 Hypertensive heart disease with heart failure: Secondary | ICD-10-CM | POA: Insufficient documentation

## 2019-05-13 LAB — BASIC METABOLIC PANEL
Anion gap: 12 (ref 5–15)
BUN: 28 mg/dL — ABNORMAL HIGH (ref 8–23)
CO2: 24 mmol/L (ref 22–32)
Calcium: 8.6 mg/dL — ABNORMAL LOW (ref 8.9–10.3)
Chloride: 103 mmol/L (ref 98–111)
Creatinine, Ser: 1.19 mg/dL (ref 0.61–1.24)
GFR calc Af Amer: >60 mL/min (ref >60)
GFR calc non Af Amer: >60 mL/min (ref >60)
Glucose, Bld: 92 mg/dL (ref 70–99)
Potassium: 4.1 mmol/L (ref 3.5–5.1)
Sodium: 139 mmol/L (ref 135–145)

## 2019-05-13 MED ORDER — POTASSIUM CHLORIDE CRYS ER 20 MEQ PO TBCR: 20.0000 meq | EXTENDED_RELEASE_TABLET | Freq: Every day | ORAL | 3 refills | Status: DC

## 2019-05-13 MED ORDER — FARXIGA 5 MG PO TABS: 5.0000 mg | ORAL_TABLET | Freq: Every day | ORAL | 0 refills | Status: DC

## 2019-05-13 MED ORDER — ENTRESTO 97-103 MG PO TABS: 1.0000 | ORAL_TABLET | Freq: Two times a day (BID) | ORAL | 11 refills | Status: DC

## 2019-05-13 MED FILL — FARXIGA 5 MG TABLET: 5 | 30 days supply | Qty: 30 | Fill #0

## 2019-05-13 NOTE — Progress Notes (Signed)
PCP:  [name], NP   Cardiologist:  [name], MD HF Cardiology: Dr Aundra Dubin   HPI:  Frank Love is a 64 y.o. male with a history of HTN, LE edema, and elevated BNP who returns for followup of CHF and CAD.   He initially was seen by PCP in 5/20 with elevated BNP and was started on lasix with cardiology referral, lasix was subsequently increased to 40 mg bid due to ongoing LE edema. He underwent cardiac evaluation for right hip surgery in September with continued symptoms of LE edema on lasix 40 bid. Echo in 123456 showedsystolic HFwith EF [id number].He was started on Entresto and metoprolol at this time.  He underwent cath 11/20 which showedmultivessel disease, now s/p CABGx4 12/12/18.  Repeat echo done 12/17/18 showed severely decreased right ventricular systolic function with severe enlargement in addition to small pericardial effusion, otherwise similar to previous. VQ scan was negative for PE. Transferred to CIR for rehab.   He had right THR done in [id number] with no complications.   Echo was done 04/18/19 and reviewed, EF 35-40%, mild LVH, mild LV dilation, moderately decreased RV systolic function, normal RV size.    Today he returns to HF clinic for pharmacist medication titration. At last visit with MD on 04/18/19, he was mildly fluid overloaded and ReDS reading was elevated to 40%. His Entresto was increased to 49/51 mg BID and he was started on furosemide 20 mg daily. He was then seen by Dr. Percival Spanish on 05/02/19 and his [name] was increased further to 97/103 mg BID.   Overall, he feels great and has no complaints. He denies dizziness, lightheadedness, fatigue, chest pain, or palpitations. He reports his breathing is great and denies shortness of breath. He is able to complete all ADLs but says after his recent hip surgery, it can be difficult at times to walk up and down steps. He no longer requires a cane for assistance. He does not weigh himself regularly at home due to  being unsteady on his scale after the surgery. He has some extra fluid weight today which he believes is related to eating more salt than usual. He has 1+ bilateral LEE in his ankles, L>R. ReDS reading is elevated at 42%. He does take furosemide 20 mg daily with potassium 20 mEq daily. No JVD, PND, or orthopnea. He says his appetite is "too good" and admits to not following a low sodium diet.  . Shortness of breath/dyspnea on exertion? no  . Orthopnea/PND? no . Edema? Yes- L foot > R . Lightheadedness/dizziness? no . Daily weights at home? no . Blood pressure/heart rate monitoring at home? no . Following low-sodium/fluid-restricted diet? no  HF Medications: Furosemide 20 mg daily Carvedilol 6.25 mg BID Enresto 97/103 mg BID Spironolactone 25 mg daily Digoxin 0.125 mg daily  Has the patient been experiencing any side effects to the medications prescribed?  no  Does the patient have any problems obtaining medications due to transportation or finances?   Yes - no prescription insurance. Maryan Puls from [employer] patient assistance program and other medications from [employer] [facility]  Understanding of regimen: good Understanding of indications: good Potential of compliance: good Patient understands to avoid NSAIDs. Patient understands to avoid decongestants.    Pertinent Lab Values: . Serum creatinine 1.19, BUN 28, Potassium 4.1, Sodium 139, Digoxin 0.6   Vital Signs: . Weight: 243.2 lbs (last clinic weight: 242 lbs) . Blood pressure: 122/66 mmHg . Heart rate: 60 bpm  Assessment: 1. CAD:  S/p CABG in 11/20. No chest pain.  - Continue ASA 81 daily.  - Continue statin. Lipids at goal 2/21.   2. Chronic systolic CHF: Ischemic cardiomyopathy.  Pre-CABG echo with EF 30-35%, mild RV dysfunction.  Post-CABG echo with EF 30-35%, severe RV systolic dysfunction.  V/Q scan done, no evidence for PE.  Concerned for peri-op RV infarction. Initially hypotensive post-CABG  requiring pressors and then midodrine, but improved over time. EF 35-40% with moderate RV dysfunction.   -NYHA class II now, he is mildly volume overloaded on exam and also volume overloaded by REDS clip (42%).  - Labs stable: SCr 1.19, K 4.1 - Vitals: BP 122/66, HR 60 - Increase furosemide to 40 mg daily x 2 days, then resume 20 mg daily. Restrict sodium in diet. - Continue carvedilol 6.25 mg BID - Continue Entresto 97/103 mg BID - sent updated prescription to [employer].  - Continue spironolactone 25 mg daily - Continue digoxin 0.125 mg daily (last level 0.6 ng/mL on 04/18/19) - Start Farxiga 5 mg daily - provided 30 day free card and will begin [employer] patient assistance enrollment forms. Repeat BMET in 1 month. He reported history of UTI's in the past. Will add 5 mg of Farxiga instead of 10 mg for this reason to ensure he tolerates the medication. Increase as tolerated.  - Echo is borderline for ICD. Plan for cardiac MRI to quantify EF more exactly for purposes of ICD determination.  Narrow QRS so not CRT candidate.   3. Carotid stenosis - He will need repeat carotid dopplers in 11/21.    4. Polypharmacy: He has concerns with the number of medications he is taking currently. - Trazodone: has not taken trazodone in months - requested to remove from profile - Pantoprazole: pt has not had GERD, heartburn, or other GI indications for PPI. Believe this was continued from hospitalization - remove from profile per patient request. - Multivitamin: pt requested to remove from profile - does not want to take    Plan: 1) Medication changes: Based on clinical presentation, vital signs and recent labs will increase furosemide to 40 mg daily x 2 days, then continue furosemide 20 mg daily AND add Farxiga 5 mg daily. 2) Labs: SCr 1.19, K 4.1. Repeat BMET in 1 month 3) Follow-up: 06/10/19 with PharmD; 08/13/19 with Dr. Johny Shock, PharmD, BCPS Heart Failure Clinic  Pharmacist [phone]  Audry Riles, PharmD, BCPS, [facility], CPP Heart Failure Clinic Pharmacist [phone]

## 2019-05-13 NOTE — Progress Notes (Signed)
Assessment & Plan:  Frank Love was seen today for follow-up.  Diagnoses and all orders for this visit:  Essential hypertension Continue all antihypertensives as prescribed.  Remember to bring in your blood pressure log with you for your follow up appointment.  DASH/Mediterranean Diets are healthier choices for HTN.    Anemia, unspecified type -     CBC  Prostate cancer screening -     PSA  Need for hepatitis C screening test -     Hepatitis C Antibody  Encounter for screening for HIV -     HIV antibody (with reflex)  Colon cancer screening -     Ambulatory referral to Gastroenterology  Impacted cerumen of both ears Scheduled nurse visit  Patient has been counseled on age-appropriate routine health concerns for screening and prevention. These are reviewed and up-to-date. Referrals have been placed accordingly. Immunizations are up-to-date or declined.    Subjective:   Chief Complaint  Patient presents with  . Follow-up    Pt. is here for hypertension follow up.    HPI Frank Love 64 y.o. male presents to office today for follow up. Seeing Cardiology and TCTS. S/P CABG x4 in Nov 2020 for ICM. 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 S/P R THA 02-2019.  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 I encouraged him to purchase a health tracker to accurately assess his steps and activity since he would like to work on losing weight and staying as healthy as possible    Essential Hypertension Well controlled. He is doing very good today. Denies chest pain, shortness of breath, palpitations, lightheadedness, dizziness, headaches or increased BLE edema. Taking carvedilol 6.25 mg BID, Entresto 97-103 mg BID, spironolactone 25 mg daily, furosemide 20 mg daily, digoxin 0.125 mg daily. Is picking up Wilder Glade for the first time today.  BP Readings from Last 3 Encounters:  05/13/19  124/72  05/13/19 122/66  05/06/19 128/63    Review of Systems  Constitutional: Negative for fever, malaise/fatigue and weight loss.  HENT: Positive for hearing loss. Negative for nosebleeds.   Eyes: Negative.  Negative for blurred vision, double vision and photophobia.  Respiratory: Negative.  Negative for cough and shortness of breath.   Cardiovascular: Negative.  Negative for chest pain, palpitations and leg swelling.  Gastrointestinal: Negative.  Negative for heartburn, nausea and vomiting.  Musculoskeletal: Negative.  Negative for myalgias.  Neurological: Negative.  Negative for dizziness, focal weakness, seizures and headaches.  Psychiatric/Behavioral: Negative.  Negative for suicidal ideas.    Past Medical History:  Diagnosis Date  . Cancer ([place])    penile cancer  . Chronic pain of right knee   . Coronary artery disease   . Hypertension   . Phimosis     Past Surgical History:  Procedure Laterality Date  . CIRCUMCISION N/A 10/12/2018   Procedure: CIRCUMCISION ADULT;  Surgeon: Franchot Gallo, MD;  Location: [facility];  Service: Urology;  Laterality: N/A;  45 mins  . CO2 LASER APPLICATION N/A 123456   Procedure: CO2 LASER APPLICATION;  Surgeon: Franchot Gallo, MD;  Location: [facility];  Service: Urology;  Laterality: N/A;  30 MINS  . COLONOSCOPY    . CORONARY ARTERY BYPASS GRAFT N/A 12/12/2018   Procedure: CORONARY ARTERY BYPASS GRAFTING (CABG) x four, using bilateral internal mammary arteries and right leg greater saphenous vein harvested endoscopically;  Surgeon: [name], MD;  Location: [facility];  Service:  Open Heart Surgery;  Laterality: N/A;  . LEFT HEART CATH AND CORONARY ANGIOGRAPHY N/A 11/28/2018   Procedure: LEFT HEART CATH AND CORONARY ANGIOGRAPHY;  Surgeon: Burnell Blanks, MD;  Location: [facility];  Service: Cardiovascular;  Laterality: N/A;  . MEDIASTINAL EXPLORATION N/A 12/12/2018   Procedure: Mediastinal Re-Exploration after bypass  graft;  Surgeon: [name], MD;  Location: [facility];  Service: Open Heart Surgery;  Laterality: N/A;  . TEE WITHOUT CARDIOVERSION N/A 12/12/2018   Procedure: TRANSESOPHAGEAL ECHOCARDIOGRAM (TEE);  Surgeon: [name], MD;  Location: [facility];  Service: Open Heart Surgery;  Laterality: N/A;  . TOOTH EXTRACTION    . TOTAL HIP ARTHROPLASTY Right 03/08/2019   Procedure: RIGHT TOTAL HIP ARTHROPLASTY ANTERIOR APPROACH;  Surgeon: Mcarthur Rossetti, MD;  Location: [facility];  Service: Orthopedics;  Laterality: Right;    Family History  Problem Relation Age of Onset  . Diabetes Mother   . Heart disease Mother     Social History Reviewed with no changes to be made today.   Outpatient Medications Prior to Visit  Medication Sig Dispense Refill  . aspirin EC 81 MG tablet Take 81 mg by mouth at bedtime.     Marland Kitchen atorvastatin (LIPITOR) 40 MG tablet Take 1 tablet (40 mg total) by mouth daily at 6 PM. 30 tablet 5  . carvedilol (COREG) 6.25 MG tablet Take 1 tablet (6.25 mg total) by mouth 2 (two) times daily with a meal. 60 tablet 3  . dapagliflozin propanediol (FARXIGA) 5 MG TABS tablet Take 5 mg by mouth daily before breakfast. 30 tablet 0  . digoxin (LANOXIN) 0.125 MG tablet Take 1 tablet (0.125 mg total) by mouth daily. 30 tablet 5  . furosemide (LASIX) 20 MG tablet Take 1 tablet (20 mg total) by mouth daily. 90 tablet 3  . potassium chloride SA (KLOR-CON) 20 MEQ tablet Take 1 tablet (20 mEq total) by mouth daily. Takes while taking furosemide 90 tablet 3  . sacubitril-valsartan (ENTRESTO) 97-103 MG Take 1 tablet by mouth 2 (two) times daily. 60 tablet 11  . spironolactone (ALDACTONE) 25 MG tablet Take 1 tablet (25 mg total) by mouth daily. 30 tablet 3  . nitroGLYCERIN (NITROSTAT) 0.4 MG SL tablet Place 1 tablet (0.4 mg total) under the tongue every 5 (five) minutes as needed for chest pain. NO more than 3 pills--if you have the need to take 2 pills or more call MD 30 tablet 0   No  facility-administered medications prior to visit.    No Known Allergies     Objective:    BP 124/72 (BP Location: Left Arm, Patient Position: Sitting, Cuff Size: Normal)   Pulse 62   Temp 97.9 F (36.6 C) (Temporal)   Ht 6\' 2"  (1.88 m)   Wt 242 lb (109.8 kg)   SpO2 99%   BMI 31.07 kg/m  Wt Readings from Last 3 Encounters:  05/13/19 242 lb (109.8 kg)  05/13/19 243 lb 3.2 oz (110.3 kg)  05/06/19 242 lb (109.8 kg)    Physical Exam Vitals and nursing note reviewed.  Constitutional:      Appearance: He is well-developed.  HENT:     Head: Normocephalic and atraumatic.     Right Ear: Decreased hearing noted. There is impacted cerumen.     Left Ear: Decreased hearing noted. There is impacted cerumen.  Cardiovascular:     Rate and Rhythm: Regular rhythm. Bradycardia present.     Heart sounds: Normal heart sounds. No murmur.  No friction rub. No gallop.   Pulmonary:     Effort: Pulmonary effort is normal. No tachypnea or respiratory distress.     Breath sounds: Normal breath sounds. No decreased breath sounds, wheezing, rhonchi or rales.  Chest:     Chest wall: No tenderness.  Abdominal:     General: Bowel sounds are normal.     Palpations: Abdomen is soft.  Musculoskeletal:        General: Normal range of motion.     Cervical back: Normal range of motion.  Skin:    General: Skin is warm and dry.  Neurological:     Mental Status: He is alert and oriented to person, place, and time.     Coordination: Coordination normal.  Psychiatric:        Behavior: Behavior normal. Behavior is cooperative.        Thought Content: Thought content normal.        Judgment: Judgment normal.          Patient has been counseled extensively about nutrition and exercise as well as the importance of adherence with medications and regular follow-up. The patient was given clear instructions to go to ER or return to medical center if symptoms don't improve, worsen or new problems develop. The  patient verbalized understanding.   Follow-up: Return in about 6 months (around 11/12/2019).   [name], FNP-BC [facility] and [place], [place]   05/13/2019, 7:32 PM

## 2019-05-13 NOTE — Patient Instructions (Signed)
It was a pleasure seeing you today!  MEDICATIONS: -We are changing your medications today -Start Farxiga 1 tablet (5 mg) by mouth once daily. First 30 days will be free with coupon card printed for you. Then you will get further refills from the drug company with their patient assistance program. -Increase your lasix to 40 mg tomorrow and the next day, then resume 20 mg daily -Continue taking your other medications as directed -Call if you have questions about your medications.  LABS: -We will call you if your labs need attention.  NEXT APPOINTMENT: Return to clinic in 4 weeks with Pharmacist.  In general, to take care of your heart failure: -Limit your fluid intake to 2 Liters (half-gallon) per day.   -Limit your salt intake to ideally 2-3 grams (2000-3000 mg) per day. -Weigh yourself daily and record, and bring that "weight diary" to your next appointment.  (Weight gain of 2-3 pounds in 1 day typically means fluid weight.) -The medications for your heart are to help your heart and help you live longer.   -Please contact us before stopping any of your heart medications.  Call the clinic at [phone] with questions or to reschedule future appointments.

## 2019-05-14 ENCOUNTER — Telehealth (HOSPITAL_COMMUNITY): Payer: Self-pay | Admitting: Pharmacist

## 2019-05-14 NOTE — Telephone Encounter (Signed)
Sent in Manufacturer's Assistance application to [employer] for Farxiga.    Application pending, will continue to follow.   Solomiya Pascale, PharmD, BCPS, BCCP, CPP Heart Failure Clinic Pharmacist [phone]  

## 2019-05-15 ENCOUNTER — Telehealth: Payer: Self-pay | Admitting: Cardiology

## 2019-05-15 NOTE — Telephone Encounter (Signed)
BMP done by Dr Aundra Dubin on 4/19

## 2019-05-15 NOTE — Telephone Encounter (Signed)
New message   Patient states that he has already had blood work done thru Dr. Claris Gladden office so he will not be having the lab work done that Dr. Percival Spanish ordered.

## 2019-05-16 ENCOUNTER — Other Ambulatory Visit: Payer: Self-pay

## 2019-05-16 ENCOUNTER — Ambulatory Visit: Payer: Self-pay | Attending: Nurse Practitioner

## 2019-05-17 ENCOUNTER — Telehealth (HOSPITAL_COMMUNITY): Payer: Self-pay

## 2019-05-17 NOTE — Telephone Encounter (Signed)
Received prescription clarification request regarding patients Entresto.  They want clarification of dose 49/51mg  vs 97/103bid.  Clarified with PharmD Ander Purpura, pt is on 97/103mg . Clarification noted on Rx, signed by pharmD and faxed back to [facility].

## 2019-05-21 ENCOUNTER — Telehealth (HOSPITAL_COMMUNITY): Payer: Self-pay | Admitting: Pharmacist

## 2019-05-21 MED FILL — ?DIGITEK 125 MCG TABLET: 125 | 30 days supply | Qty: 30 | Fill #3

## 2019-05-21 MED FILL — ?CARVEDILOL 6.25 MG TABLET: 6.25 | 30 days supply | Qty: 60 | Fill #2

## 2019-05-21 MED FILL — ?SPIRONOLACTONE 25 MG TABLE: 25 | 30 days supply | Qty: 30 | Fill #1

## 2019-05-21 MED FILL — ?ATORVASTATIN 40MG TABLET: 40 | 30 days supply | Qty: 30 | Fill #3

## 2019-05-21 MED FILL — ?FUROSEMIDE 20MG TABLET: 20 | 30 days supply | Qty: 30 | Fill #1

## 2019-05-21 NOTE — Telephone Encounter (Signed)
Cardiac Rehab Medication Review by a Pharmacist  Does the patient  feel that his/her medications are working for him/her?  Yes - Pt believes that they appear to be working  Has the patient been experiencing any side effects to the medications prescribed?  No side effects  Does the patient measure his/her own blood pressure or blood glucose at home?  Patient does not check blood pressure at home but patient usually has a doctor visit weekly and runs about 130/78. No history of diabetes.  Does the patient have any problems obtaining medications due to transportation or finances?   No issues  Understanding of regimen: good Understanding of indications: fair Potential of compliance: excellent - Pt states he's missed 0 doses in the last 30 days.   Pharmacist comments: No issues   Sherren Kerns, PharmD PGY1 Acute Care Pharmacy Resident 05/21/2019 5:25 PM

## 2019-05-22 ENCOUNTER — Ambulatory Visit: Payer: Self-pay | Attending: Nurse Practitioner

## 2019-05-22 ENCOUNTER — Other Ambulatory Visit: Payer: Self-pay

## 2019-05-22 DIAGNOSIS — H6123 Impacted cerumen, bilateral: Secondary | ICD-10-CM

## 2019-05-22 NOTE — Progress Notes (Signed)
Pt. Was here for both of his ear to be irrigated. Pt. Has been putting Debrox in his ears to help loosen up his ear wax.  CMA was able to flush his left ear, and was able to see his ear, but there was a little bit of wax still blocking his ear drum. Pt. Was informed to keep dropping Debrox in his ear to loosen it up.  CMA tried to flush his right ear, but patient was having pain and stated it may be tender.  CMA inform patient he can keep putting Debrox in his ear and if it does not help, to schedule another nurse visit for a ear irrigation.   Pt. Understood.

## 2019-05-22 NOTE — Telephone Encounter (Signed)
Advanced Heart Failure Patient Advocate Encounter   Patient was approved to receive Farxiga from [employer].  Effective dates: 05/15/19 through 05/13/20  Audry Riles, PharmD, BCPS, BCCP, CPP Heart Failure Clinic Pharmacist [phone]

## 2019-05-23 DIAGNOSIS — Z736 Limitation of activities due to disability: Secondary | ICD-10-CM

## 2019-05-27 ENCOUNTER — Other Ambulatory Visit: Payer: Self-pay

## 2019-05-27 ENCOUNTER — Encounter (HOSPITAL_COMMUNITY)
Admission: RE | Admit: 2019-05-27 | Discharge: 2019-05-27 | Disposition: A | Discharge status: Home / Self Care | Payer: Self-pay | Source: Ambulatory Visit | Attending: Cardiology | Admitting: Cardiology

## 2019-05-27 DIAGNOSIS — I2510 Atherosclerotic heart disease of native coronary artery without angina pectoris: Secondary | ICD-10-CM | POA: Insufficient documentation

## 2019-05-27 DIAGNOSIS — Z951 Presence of aortocoronary bypass graft: Secondary | ICD-10-CM | POA: Insufficient documentation

## 2019-05-27 NOTE — Progress Notes (Signed)
Cardiac Rehab Note:  Successful telephone encounter to Frank Love to confirm Cardiac Rehab orientation appointment for 05/28/19 at 8:30. Nursing assessment completed. Patient questions answered. Instructions for appointment provided. Patient screening for Covid-19 negative.  [name] RN, BSN [name]. [facility]  Cardiac and Pulmonary Rehabilitation Phone: [phone] Fax: [phone]

## 2019-05-28 ENCOUNTER — Ambulatory Visit (INDEPENDENT_AMBULATORY_CARE_PROVIDER_SITE_OTHER): Payer: Self-pay | Admitting: Urology

## 2019-05-28 ENCOUNTER — Encounter: Payer: Self-pay | Admitting: Urology

## 2019-05-28 ENCOUNTER — Encounter (HOSPITAL_COMMUNITY)
Admission: RE | Admit: 2019-05-28 | Discharge: 2019-05-28 | Disposition: A | Discharge status: Home / Self Care | Payer: Self-pay | Source: Ambulatory Visit | Attending: Cardiology | Admitting: Cardiology

## 2019-05-28 ENCOUNTER — Encounter (HOSPITAL_COMMUNITY): Payer: Self-pay

## 2019-05-28 VITALS — BP 112/60 | HR 76 | Temp 97.0°F | Resp 18 | Ht 73.5 in | Wt 239.0 lb

## 2019-05-28 VITALS — BP 113/64 | HR 60 | Temp 98.4°F | Ht 74.5 in | Wt 239.5 lb

## 2019-05-28 DIAGNOSIS — Z951 Presence of aortocoronary bypass graft: Secondary | ICD-10-CM

## 2019-05-28 DIAGNOSIS — Z08 Encounter for follow-up examination after completed treatment for malignant neoplasm: Secondary | ICD-10-CM

## 2019-05-28 DIAGNOSIS — Z8549 Personal history of malignant neoplasm of other male genital organs: Secondary | ICD-10-CM

## 2019-05-28 MED ORDER — CLOTRIMAZOLE-BETAMETHASONE 1-0.05 % EX CREA: TOPICAL_CREAM | CUTANEOUS | 1 refills | Status: DC

## 2019-05-28 MED FILL — CLOTRIMAZOLE-BETAMETHASONE: 1-0.05 | 7 days supply | Qty: 15 | Fill #0

## 2019-05-28 NOTE — Progress Notes (Signed)
Cardiac Individual Treatment Plan  Patient Details  Name: Frank Love MRN: [id number] Date of Birth: [birth year] Referring Provider:     CARDIAC REHAB [facility] from 05/28/2019 in [place]  Referring Provider  [place]      Initial Encounter Date:    CARDIAC REHAB PHASE II ORIENTATION from 05/28/2019 in [facility]  Date  05/28/19      Visit Diagnosis: S/P CABG x 4  Patient's Home Medications on Admission:  Current Outpatient Medications:  .  aspirin EC 81 MG tablet, Take 81 mg by mouth at bedtime. , Disp: , Rfl:  .  atorvastatin (LIPITOR) 40 MG tablet, Take 1 tablet (40 mg total) by mouth daily at 6 PM., Disp: 30 tablet, Rfl: 5 .  carvedilol (COREG) 6.25 MG tablet, Take 1 tablet (6.25 mg total) by mouth 2 (two) times daily with a meal., Disp: 60 tablet, Rfl: 3 .  dapagliflozin propanediol (FARXIGA) 5 MG TABS tablet, Take 5 mg by mouth daily before breakfast., Disp: 30 tablet, Rfl: 0 .  digoxin (LANOXIN) 0.125 MG tablet, Take 1 tablet (0.125 mg total) by mouth daily., Disp: 30 tablet, Rfl: 5 .  furosemide (LASIX) 20 MG tablet, Take 1 tablet (20 mg total) by mouth daily., Disp: 90 tablet, Rfl: 3 .  nitroGLYCERIN (NITROSTAT) 0.4 MG SL tablet, Place 1 tablet (0.4 mg total) under the tongue every 5 (five) minutes as needed for chest pain. NO more than 3 pills--if you have the need to take 2 pills or more call MD (Patient not taking: Reported on 05/21/2019), Disp: 30 tablet, Rfl: 0 .  potassium chloride SA (KLOR-CON) 20 MEQ tablet, Take 1 tablet (20 mEq total) by mouth daily. Takes while taking furosemide, Disp: 90 tablet, Rfl: 3 .  sacubitril-valsartan (ENTRESTO) 97-103 MG, Take 1 tablet by mouth 2 (two) times daily., Disp: 60 tablet, Rfl: 11 .  spironolactone (ALDACTONE) 25 MG tablet, Take 1 tablet (25 mg total) by mouth daily., Disp: 30 tablet, Rfl: 3  Past Medical History: Past Medical History:  Diagnosis  Date  . Cancer ([place])    penile cancer  . Chronic pain of right knee   . Coronary artery disease   . Hypertension   . Phimosis     Tobacco Use: Social History   Tobacco Use  Smoking Status Never Smoker  Smokeless Tobacco Never Used    Labs: Recent Review Flowsheet Data    Labs for ITP Cardiac and Pulmonary Rehab Latest Ref Rng & Units 12/12/2018 12/13/2018 12/13/2018 12/13/2018 03/18/2019   Cholestrol 0 - 200 mg/dL - - - - 105   LDLCALC 0 - 99 mg/dL - - - - 61   HDL >40 mg/dL - - - - 34(L)   Trlycerides <150 mg/dL - - - - 51   Hemoglobin A1c 4.8 - 5.6 % - - - - -   PHART 7.350 - 7.450 7.255(L) - 7.380 7.351 -   PCO2ART 32.0 - 48.0 mmHg 49.4(H) - 36.6 40.3 -   HCO3 20.0 - 28.0 mmol/L 21.8 - 21.5 22.2 -   TCO2 22 - 32 mmol/L 23 - 23 23 -   ACIDBASEDEF 0.0 - 2.0 mmol/L 5.0(H) - 3.0(H) 3.0(H) -   O2SAT % 94.0 74.1 99.0 98.0 -      Capillary Blood Glucose: Lab Results  Component Value Date   GLUCAP 97 12/19/2018   GLUCAP 85 12/18/2018   GLUCAP 93 12/18/2018   GLUCAP 88 12/18/2018  GLUCAP 100 (H) 12/18/2018     Exercise Target Goals: Exercise Program Goal: Individual exercise prescription set using results from initial 6 min walk test and THRR while considering  patient's activity barriers and safety.   Exercise Prescription Goal: Initial exercise prescription builds to 30-45 minutes a day of aerobic activity, 2-3 days per week.  Home exercise guidelines will be given to patient during program as part of exercise prescription that the participant will acknowledge.  Activity Barriers & Risk Stratification: Activity Barriers & Cardiac Risk Stratification - 05/28/19 0954      Activity Barriers & Cardiac Risk Stratification   Activity Barriers  Joint Problems;Balance Concerns;Right Hip Replacement;Decreased Ventricular Function    Cardiac Risk Stratification  High       6 Minute Walk: 6 Minute Walk    Row Name 05/28/19 0952         6 Minute Walk   Distance   1254 feet     Walk Time  6 minutes     # of Rest Breaks  0     MPH  3.1     METS  3.1     RPE  11     Perceived Dyspnea   0     VO2 Peak  11.19     Symptoms  No     Resting HR  76 bpm     Resting BP  112/60     Resting Oxygen Saturation   98 %     Exercise Oxygen Saturation  during 6 min walk  96 %     Max Ex. HR  98 bpm     Max Ex. BP  154/69     2 Minute Post BP  131/59        Oxygen Initial Assessment:   Oxygen Re-Evaluation:   Oxygen Discharge (Final Oxygen Re-Evaluation):   Initial Exercise Prescription: Initial Exercise Prescription - 05/28/19 1200      Date of Initial Exercise RX and Referring Provider   Date  05/28/19    Referring Provider  Hochrein    Expected Discharge Date  07/26/19      NuStep   Level  3    SPM  60    Minutes  30    METs  2      Intensity   THRR 40-80% of Max Heartrate  63-126    Ratings of Perceived Exertion  11-13    Perceived Dyspnea  0-4      Progression   Progression  Continue progressive overload as per policy without signs/symptoms or physical distress.      Resistance Training   Training Prescription  Yes    Weight  3    Reps  10-15       Perform Capillary Blood Glucose checks as needed.  Exercise Prescription Changes:   Exercise Comments:   Exercise Goals and Review:  Exercise Goals    Row Name 05/28/19 0957             Exercise Goals   Increase Physical Activity  Yes       Intervention  Provide advice, education, support and counseling about physical activity/exercise needs.;Develop an individualized exercise prescription for aerobic and resistive training based on initial evaluation findings, risk stratification, comorbidities and participant's personal goals.       Expected Outcomes  Short Term: Attend rehab on a regular basis to increase amount of physical activity.;Long Term: Add in home exercise to make exercise part of routine and  to increase amount of physical activity.;Long Term: Exercising  regularly at least 3-5 days a week.       Increase Strength and Stamina  Yes       Intervention  Provide advice, education, support and counseling about physical activity/exercise needs.;Develop an individualized exercise prescription for aerobic and resistive training based on initial evaluation findings, risk stratification, comorbidities and participant's personal goals.       Expected Outcomes  Short Term: Increase workloads from initial exercise prescription for resistance, speed, and METs.;Short Term: Perform resistance training exercises routinely during rehab and add in resistance training at home;Long Term: Improve cardiorespiratory fitness, muscular endurance and strength as measured by increased METs and functional capacity (6MWT)       Able to understand and use rate of perceived exertion (RPE) scale  Yes       Intervention  Provide education and explanation on how to use RPE scale       Expected Outcomes  Short Term: Able to use RPE daily in rehab to express subjective intensity level;Long Term:  Able to use RPE to guide intensity level when exercising independently       Knowledge and understanding of Target Heart Rate Range (THRR)  Yes       Intervention  Provide education and explanation of THRR including how the numbers were predicted and where they are located for reference       Expected Outcomes  Short Term: Able to state/look up THRR;Long Term: Able to use THRR to govern intensity when exercising independently;Short Term: Able to use daily as guideline for intensity in rehab       Able to check pulse independently  Yes       Intervention  Provide education and demonstration on how to check pulse in carotid and radial arteries.;Review the importance of being able to check your own pulse for safety during independent exercise       Expected Outcomes  Short Term: Able to explain why pulse checking is important during independent exercise;Long Term: Able to check pulse independently and  accurately       Understanding of Exercise Prescription  Yes       Intervention  Provide education, explanation, and written materials on patient's individual exercise prescription       Expected Outcomes  Short Term: Able to explain program exercise prescription;Long Term: Able to explain home exercise prescription to exercise independently          Exercise Goals Re-Evaluation :   Discharge Exercise Prescription (Final Exercise Prescription Changes):   Nutrition:  Target Goals: Understanding of nutrition guidelines, daily intake of sodium 1500mg , cholesterol 200mg , calories 30% from fat and 7% or less from saturated fats, daily to have 5 or more servings of fruits and vegetables.  Biometrics: Pre Biometrics - 05/28/19 0912      Pre Biometrics   Waist Circumference  49 inches    Hip Circumference  48 inches    Waist to Hip Ratio  1.02 %    Triceps Skinfold  18 mm    % Body Fat  33.1 %    Grip Strength  40 kg    Flexibility  --   Not done due to recent total hip replacement (03/08/2019)   Single Leg Stand  2.68 seconds        Nutrition Therapy Plan and Nutrition Goals:   Nutrition Assessments:   Nutrition Goals Re-Evaluation:   Nutrition Goals Re-Evaluation:   Nutrition Goals Discharge (Final Nutrition Goals  Re-Evaluation):   Psychosocial: Target Goals: Acknowledge presence or absence of significant depression and/or stress, maximize coping skills, provide positive support system. Participant is able to verbalize types and ability to use techniques and skills needed for reducing stress and depression.  Initial Review & Psychosocial Screening: Initial Psych Review & Screening - 05/28/19 0905      Initial Review   Current issues with  None Identified      Family Dynamics   Good Support System?  Yes    Comments  Mr. Raynard has a positive attitude and outlook. He has a strong support system including his wife, children, and grandchildren. Prior to covid 19  restrictions he was an active member of [employer]. He continues to engage with his church family remotely. No barriers identified at this time. Will continue to monitor as patient continues in the CR exercise program. No interventions.      Barriers   Psychosocial barriers to participate in program  There are no identifiable barriers or psychosocial needs.      Screening Interventions   Interventions  Encouraged to exercise       Quality of Life Scores: Quality of Life - 05/28/19 1018      Quality of Life   Select  Quality of Life      Quality of Life Scores   Health/Function Pre  25.2 %    Socioeconomic Pre  27.93 %    Psych/Spiritual Pre  27.93 %    Family Pre  28.8 %    GLOBAL Pre  26.85 %      Scores of 19 and below usually indicate a poorer quality of life in these areas.  A difference of  2-3 points is a clinically meaningful difference.  A difference of 2-3 points in the total score of the Quality of Life Index has been associated with significant improvement in overall quality of life, self-image, physical symptoms, and general health in studies assessing change in quality of life.  PHQ-9: Recent Review Flowsheet Data    Depression screen [facility] 2/9 07/06/2018 06/12/2018 04/17/2015   Decreased Interest 0 0 0   Down, Depressed, Hopeless 0 0 0   PHQ - 2 Score 0 0 0   Altered sleeping 0 1 -   Tired, decreased energy 0 0 -   Change in appetite 0 0 -   Feeling bad or failure about yourself  0 0 -   Trouble concentrating 0 0 -   Moving slowly or fidgety/restless 0 0 -   Suicidal thoughts 0 0 -   PHQ-9 Score 0 1 -     Interpretation of Total Score  Total Score Depression Severity:  1-4 = Minimal depression, 5-9 = Mild depression, 10-14 = Moderate depression, 15-19 = Moderately severe depression, 20-27 = Severe depression   Psychosocial Evaluation and Intervention:   Psychosocial Re-Evaluation:   Psychosocial Discharge (Final Psychosocial  Re-Evaluation):   Vocational Rehabilitation: Provide vocational rehab assistance to qualifying candidates.   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 Participant will state understanding/return demonstration of topics presented.  Learning  Barriers/Preferences: Learning Barriers/Preferences - 05/28/19 1203      Learning Barriers/Preferences   Learning Barriers  Hearing    Learning Preferences  Skilled Demonstration       Education Topics: Count Your Pulse:  -Group instruction provided by verbal instruction, demonstration, patient participation and written materials to support subject.  Instructors address importance of being able to find your pulse and how to count your pulse when at home without a heart monitor.  Patients get hands on experience counting their pulse with staff help and individually.   Heart Attack, Angina, and Risk Factor Modification:  -Group instruction provided by verbal instruction, video, and written materials to support subject.  Instructors address signs and symptoms of angina and heart attacks.    Also discuss risk factors for heart disease and how to make changes to improve heart health risk factors.   Functional Fitness:  -Group instruction provided by verbal instruction, demonstration, patient participation, and written materials to support subject.  Instructors address safety measures for doing things around the house.  Discuss how to get up and down off the floor, how to pick things up properly, how to safely get out of a chair without assistance, and balance  training.   Meditation and Mindfulness:  -Group instruction provided by verbal instruction, patient participation, and written materials to support subject.  Instructor addresses importance of mindfulness and meditation practice to help reduce stress and improve awareness.  Instructor also leads participants through a meditation exercise.    Stretching for Flexibility and Mobility:  -Group instruction provided by verbal instruction, patient participation, and written materials to support subject.  Instructors lead participants through series of stretches that are designed to increase flexibility thus improving mobility.  These stretches are additional exercise for major muscle groups that are typically performed during regular warm up and cool down.   Hands Only CPR:  -Group verbal, video, and participation provides a basic overview of AHA guidelines for community CPR. Role-play of emergencies allow participants the opportunity to practice calling for help and chest compression technique with discussion of AED use.   Hypertension: -Group verbal and written instruction that provides a basic overview of hypertension including the most recent diagnostic guidelines, risk factor reduction with self-care instructions and medication management.    Nutrition I class: Heart Healthy Eating:  -Group instruction provided by PowerPoint slides, verbal discussion, and written materials to support subject matter. The instructor gives an explanation and review of the Therapeutic Lifestyle Changes diet recommendations, which includes a discussion on lipid goals, dietary fat, sodium, fiber, plant stanol/sterol esters, sugar, and the components of a well-balanced, healthy diet.   Nutrition II class: Lifestyle Skills:  -Group instruction provided by PowerPoint slides, verbal discussion, and written materials to support subject matter. The instructor gives an explanation and review of label reading, grocery  shopping for heart health, heart healthy recipe modifications, and ways to make healthier choices when eating out.   Diabetes Question & Answer:  -Group instruction provided by PowerPoint slides, verbal discussion, and written materials to support subject matter. The instructor gives an explanation and review of diabetes co-morbidities, pre- and post-prandial blood glucose goals, pre-exercise blood glucose goals, signs, symptoms, and treatment of hypoglycemia and hyperglycemia, and foot care basics.   Diabetes Blitz:  -Group instruction provided by PowerPoint slides, verbal discussion, and written materials to support subject matter. The instructor gives an explanation and review of the physiology behind type 1 and type 2 diabetes, diabetes medications and rational behind using different medications,  pre- and post-prandial blood glucose recommendations and Hemoglobin A1c goals, diabetes diet, and exercise including blood glucose guidelines for exercising safely.    Portion Distortion:  -Group instruction provided by PowerPoint slides, verbal discussion, written materials, and food models to support subject matter. The instructor gives an explanation of serving size versus portion size, changes in portions sizes over the last 20 years, and what consists of a serving from each food group.   Stress Management:  -Group instruction provided by verbal instruction, video, and written materials to support subject matter.  Instructors review role of stress in heart disease and how to cope with stress positively.     Exercising on Your Own:  -Group instruction provided by verbal instruction, power point, and written materials to support subject.  Instructors discuss benefits of exercise, components of exercise, frequency and intensity of exercise, and end points for exercise.  Also discuss use of nitroglycerin and activating EMS.  Review options of places to exercise outside of rehab.  Review guidelines  for sex with heart disease.   Cardiac Drugs I:  -Group instruction provided by verbal instruction and written materials to support subject.  Instructor reviews cardiac drug classes: antiplatelets, anticoagulants, beta blockers, and statins.  Instructor discusses reasons, side effects, and lifestyle considerations for each drug class.   Cardiac Drugs II:  -Group instruction provided by verbal instruction and written materials to support subject.  Instructor reviews cardiac drug classes: angiotensin converting enzyme inhibitors (ACE-I), angiotensin II receptor blockers (ARBs), nitrates, and calcium channel blockers.  Instructor discusses reasons, side effects, and lifestyle considerations for each drug class.   Anatomy and Physiology of the Circulatory System:  Group verbal and written instruction and models provide basic cardiac anatomy and physiology, with the coronary electrical and arterial systems. Review of: AMI, Angina, Valve disease, Heart Failure, Peripheral Artery Disease, Cardiac Arrhythmia, Pacemakers, and the ICD.   Other Education:  -Group or individual verbal, written, or video instructions that support the educational goals of the cardiac rehab program.   Holiday Eating Survival Tips:  -Group instruction provided by PowerPoint slides, verbal discussion, and written materials to support subject matter. The instructor gives patients tips, tricks, and techniques to help them not only survive but enjoy the holidays despite the onslaught of food that accompanies the holidays.   Knowledge Questionnaire Score: Knowledge Questionnaire Score - 05/28/19 1017      Knowledge Questionnaire Score   Pre Score  19/24       Core Components/Risk Factors/Patient Goals at Admission: Personal Goals and Risk Factors at Admission - 05/28/19 1204      Core Components/Risk Factors/Patient Goals on Admission    Weight Management  Yes;Obesity;Weight Loss    Admit Weight  239 lb (108.4 kg)     Hypertension  Yes    Intervention  Provide education on lifestyle modifcations including regular physical activity/exercise, weight management, moderate sodium restriction and increased consumption of fresh fruit, vegetables, and low fat dairy, alcohol moderation, and smoking cessation.;Monitor prescription use compliance.    Expected Outcomes  Short Term: Continued assessment and intervention until BP is < 140/32mm HG in hypertensive participants. < 130/43mm HG in hypertensive participants with diabetes, heart failure or chronic kidney disease.;Long Term: Maintenance of blood pressure at goal levels.    Lipids  Yes    Intervention  Provide education and support for participant on nutrition & aerobic/resistive exercise along with prescribed medications to achieve LDL 70mg , HDL >40mg .    Expected Outcomes  Short Term: Participant states understanding  of desired cholesterol values and is compliant with medications prescribed. Participant is following exercise prescription and nutrition guidelines.;Long Term: Cholesterol controlled with medications as prescribed, with individualized exercise RX and with personalized nutrition plan. Value goals: LDL < 70mg , HDL > 40 mg.       Core Components/Risk Factors/Patient Goals Review:    Core Components/Risk Factors/Patient Goals at Discharge (Final Review):    ITP Comments: ITP Comments    Row Name 05/28/19 0904           ITP Comments  Dr. Fransico Him, Medical Director Cardiac Rehab [facility]          Comments: Patient attended orientation on 05/28/2019 to review rules and guidelines for program.  Completed 6 minute walk test, Intitial ITP, and exercise prescription.  VSS. Telemetry-NSR with inverted T wave.  Asymptomatic. Safety measures and social distancing in place per CDC guidelines.

## 2019-05-28 NOTE — Progress Notes (Signed)
See progress notes

## 2019-05-28 NOTE — Progress Notes (Signed)
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 He tolerated this procedure well and feels like he has fully recovered.   (below copied from [facility] records):  Frank Love is a 64 year-old male established patient who is here for trouble with rollling back his foreskin.  He has had difficulties with rolling his foreskin back for 3 years.   8.25.2020: He has intermittent issues with phimosis and irritation of the foreskin over the last few years. He is often unable to retract his foreskin, and this is especially concerning given he has an upcoming hip replacement that will require catheterization. He reports having intermittent inflammation but cannot recall this last episode. This very often is quite painful for him, particularly in the evening.   9.18.2020: Underwent circumcision. Path--SCCa, CIS.   11.3.2020: Here today for follow-up post circumcision. He reports that he is recovering well though there is some tenderness of the remaining skin.   Past Medical History:  Diagnosis Date  . Cancer ([place])    penile cancer  . Chronic pain of right knee   . Coronary artery disease   . Hypertension   . Phimosis     Past Surgical History:  Procedure Laterality Date  . CIRCUMCISION N/A 10/12/2018   Procedure: CIRCUMCISION ADULT;  Surgeon: Franchot Gallo, MD;  Location: [facility];  Service: Urology;  Laterality: N/A;  45 mins  . CO2 LASER APPLICATION N/A 123456   Procedure: CO2 LASER APPLICATION;  Surgeon: Franchot Gallo, MD;  Location: [facility];  Service: Urology;  Laterality: N/A;  30 MINS  . COLONOSCOPY    . CORONARY ARTERY BYPASS GRAFT N/A 12/12/2018   Procedure: CORONARY ARTERY BYPASS GRAFTING (CABG) x four, using bilateral internal mammary arteries and right leg greater saphenous vein harvested endoscopically;  Surgeon: [name],  MD;  Location: [place];  Service: Open Heart Surgery;  Laterality: N/A;  . LEFT HEART CATH AND CORONARY ANGIOGRAPHY N/A 11/28/2018   Procedure: LEFT HEART CATH AND CORONARY ANGIOGRAPHY;  Surgeon: Burnell Blanks, MD;  Location: [place] [facility];  Service: Cardiovascular;  Laterality: N/A;  . MEDIASTINAL EXPLORATION N/A 12/12/2018   Procedure: Mediastinal Re-Exploration after bypass graft;  Surgeon: [name], MD;  Location: [facility];  Service: Open Heart Surgery;  Laterality: N/A;  . TEE WITHOUT CARDIOVERSION N/A 12/12/2018   Procedure: TRANSESOPHAGEAL ECHOCARDIOGRAM (TEE);  Surgeon: [name], MD;  Location: [facility];  Service: Open Heart Surgery;  Laterality: N/A;  . TOOTH EXTRACTION    . TOTAL HIP ARTHROPLASTY Right 03/08/2019   Procedure: RIGHT TOTAL HIP ARTHROPLASTY ANTERIOR APPROACH;  Surgeon: Mcarthur Rossetti, MD;  Location: [facility];  Service: Orthopedics;  Laterality: Right;    Home Medications:  Allergies as of 05/28/2019   No Known Allergies     Medication List       Accurate as of May 28, 2019  4:08 PM. If you have any questions, ask your nurse or doctor.        aspirin EC 81 MG tablet Take 81 mg by mouth at bedtime.   atorvastatin 40 MG tablet Commonly known as: LIPITOR Take 1 tablet (40 mg total) by mouth daily at 6 PM.   carvedilol 6.25 MG tablet Commonly known as: COREG Take 1 tablet (6.25 mg total) by mouth 2 (two) times daily with a meal.   digoxin 0.125 MG tablet Commonly  known as: LANOXIN Take 1 tablet (0.125 mg total) by mouth daily.   Entresto 97-103 MG Generic drug: sacubitril-valsartan Take 1 tablet by mouth 2 (two) times daily.   Farxiga 5 MG Tabs tablet Generic drug: dapagliflozin propanediol Take 5 mg by mouth daily before breakfast.   furosemide 20 MG tablet Commonly known as: LASIX Take 1 tablet (20 mg total) by mouth daily.   nitroGLYCERIN 0.4 MG SL tablet Commonly known as: NITROSTAT Place 1 tablet (0.4 mg total)  under the tongue every 5 (five) minutes as needed for chest pain. NO more than 3 pills--if you have the need to take 2 pills or more call MD   potassium chloride SA 20 MEQ tablet Commonly known as: KLOR-CON Take 1 tablet (20 mEq total) by mouth daily. Takes while taking furosemide   spironolactone 25 MG tablet Commonly known as: ALDACTONE Take 1 tablet (25 mg total) by mouth daily.       Allergies: No Known Allergies  Family History  Problem Relation Age of Onset  . Diabetes Mother   . Heart disease Mother     Social History:  reports that he has never smoked. He has never used smokeless tobacco. He reports that he does not drink alcohol or use drugs.  ROS: A complete review of systems was performed.  All systems are negative except for pertinent findings as noted.  Physical Exam:  Vital signs in last 24 hours: BP 113/64   Pulse 60   Temp 98.4 F (36.9 C)   Ht 6' 2.5" (1.892 m)   Wt 239 lb 8 oz (108.6 kg)   BMI 30.34 kg/m  Constitutional:  Alert and oriented, No acute distress Cardiovascular: Regular rate  Respiratory: Normal respiratory effort Genitourinary: Phallus is circumcised.  No open lesions.  Mild erythema in the glanular ridge on the right side, dorsally 7 x 2 mm.  This appears to be more infectious in nature then worry about neoplasm. Neurologic: Grossly intact, no focal deficits Psychiatric: Normal mood and affect  I have reviewed prior pt notes  I have reviewed notes from referring/previous physicians  I have reviewed urinalysis results  I have independently reviewed prior imaging  Impression/Assessment:  He tolerated procedure well -- surgical site well healed. Areas of erythema more than likely a mild yeast infection -- rx given for anti fungal cream  Plan:  1. Return for OV in 3 mo   2. Rx given for anti fungal cream to use as needed for erythema.

## 2019-06-03 ENCOUNTER — Encounter (HOSPITAL_COMMUNITY)
Admission: RE | Admit: 2019-06-03 | Discharge: 2019-06-03 | Disposition: A | Discharge status: Home / Self Care | Payer: Self-pay | Source: Ambulatory Visit | Attending: Cardiology | Admitting: Cardiology

## 2019-06-03 ENCOUNTER — Other Ambulatory Visit: Payer: Self-pay

## 2019-06-03 ENCOUNTER — Telehealth: Payer: Self-pay

## 2019-06-03 DIAGNOSIS — Z951 Presence of aortocoronary bypass graft: Secondary | ICD-10-CM

## 2019-06-03 NOTE — Telephone Encounter (Signed)
Frank Love with Rehab.called for fax number.

## 2019-06-03 NOTE — Progress Notes (Signed)
Daily Session Note  Patient Details  Name: Frank Love MRN: [id number] Date of Birth: [birth year] Referring Provider:     [facility] REH[facility] [place] from 05/28/2019 in [place]  Referring Provider  [place]      Encounter Date: 06/03/2019  Check In: Session Check In - 06/03/19 0857      Check-In   Supervising physician immediately available to respond to emergencies  [facility] immediately available    Physician(s)  Dr. Posey Pronto    Location  [facility]    Staff Present  Seward Carol, MS, ACSM CEP, Exercise Physiologist;Demyah Smyre Venetia Maxon, RN, Bjorn Loser, MS, Exercise Physiologist;Tyara Carol Ada, MS,ACSM CEP, Exercise Physiologist    Virtual Visit  No    Medication changes reported      No    Fall or balance concerns reported     No    Tobacco Cessation  No Change    Current number of cigarettes/nicotine per day      0    Warm-up and Cool-down  Performed on first and last piece of equipment    Resistance Training Performed  Yes    VAD Patient?  No    PAD/SET Patient?  No      Pain Assessment   Currently in Pain?  No/denies    Multiple Pain Sites  No       Capillary Blood Glucose: No results found for this or any previous visit (from the past 24 hour(s)).  Exercise Prescription Changes - 06/03/19 1000      Response to Exercise   Blood Pressure (Admit)  98/58    Blood Pressure (Exercise)  114/70    Blood Pressure (Exit)  114/68    Heart Rate (Admit)  71 bpm    Heart Rate (Exercise)  82 bpm    Heart Rate (Exit)  62 bpm    Rating of Perceived Exertion (Exercise)  11    Perceived Dyspnea (Exercise)  0    Symptoms  None    Comments  Pt's first day of exercise     Duration  Progress to 10 minutes continuous walking  at current work load and total walking time to 30-45 min    Intensity  THRR unchanged      Progression   Progression  Continue to progress workloads to maintain intensity without  signs/symptoms of physical distress.    Average METs  2.5      Resistance Training   Training Prescription  Yes    Weight  3lbs    Reps  10-15    Time  10 Minutes      NuStep   Level  2    SPM  85    Minutes  25    METs  2.5       Social History   Tobacco Use  Smoking Status Never Smoker  Smokeless Tobacco Never Used    Goals Met:  No report of cardiac concerns or symptoms  Goals Unmet:  Not Applicable  Comments: Frank Love started cardiac rehab today.  Pt tolerated light exercise without difficulty. initial blood pressure 98/58. Patient asymptomatic given water. Blood pressure 114/70 on the nutsep. Exit blood pressure 114/68. Frank Love exercise for 30 minutes on the nustep and did seated stretching , telemetry-Sinus Rhythm with an inverted t wave, asymptomatic.  Medication list reconciled. Pt denies barriers to medicaiton compliance.  PSYCHOSOCIAL ASSESSMENT:  PHQ-0. Pt exhibits positive coping skills, hopeful outlook with supportive family. No psychosocial  needs identified at this time, no psychosocial interventions necessary.    Pt enjoys reading and computer games.   Pt oriented to exercise equipment and routine.  Encouraged patient to drink more water. Understanding verbalized.Barnet Pall, RN,BSN 06/03/2019 12:16 PM   Dr. Fransico Him is Medical Director for Cardiac Rehab at [facility].

## 2019-06-05 ENCOUNTER — Telehealth: Payer: Self-pay | Admitting: Orthopaedic Surgery

## 2019-06-05 ENCOUNTER — Other Ambulatory Visit: Payer: Self-pay

## 2019-06-05 ENCOUNTER — Encounter (HOSPITAL_COMMUNITY)
Admission: RE | Admit: 2019-06-05 | Discharge: 2019-06-05 | Disposition: A | Discharge status: Home / Self Care | Payer: Self-pay | Source: Ambulatory Visit | Attending: Cardiology | Admitting: Cardiology

## 2019-06-05 DIAGNOSIS — Z951 Presence of aortocoronary bypass graft: Secondary | ICD-10-CM

## 2019-06-05 NOTE — Telephone Encounter (Signed)
He reviewed and initialed it. I'll bring to you

## 2019-06-05 NOTE — Telephone Encounter (Signed)
Faxed signed paper to them

## 2019-06-05 NOTE — Telephone Encounter (Signed)
Barnet Pall needs a call back from Dr. Ninfa Linden or nurse about patient. Verdis Frederickson states she faxed over limitation paperwork. Please give Verdis Frederickson a cal as soon as possible at [phone]

## 2019-06-07 ENCOUNTER — Other Ambulatory Visit: Payer: Self-pay

## 2019-06-07 ENCOUNTER — Encounter (HOSPITAL_COMMUNITY)
Admission: RE | Admit: 2019-06-07 | Discharge: 2019-06-07 | Disposition: A | Discharge status: Home / Self Care | Payer: Self-pay | Source: Ambulatory Visit | Attending: Cardiology | Admitting: Cardiology

## 2019-06-07 ENCOUNTER — Telehealth: Payer: Self-pay | Admitting: Orthopaedic Surgery

## 2019-06-07 DIAGNOSIS — Z951 Presence of aortocoronary bypass graft: Secondary | ICD-10-CM

## 2019-06-07 NOTE — Telephone Encounter (Signed)
Frank Love called looking for clarification on the last note that was faxed on 06/05/19 and would like a call back with that information.   [name]'s # [phone]

## 2019-06-07 NOTE — Telephone Encounter (Signed)
Aware no restrictions

## 2019-06-10 ENCOUNTER — Encounter (HOSPITAL_COMMUNITY): Payer: Self-pay

## 2019-06-10 ENCOUNTER — Other Ambulatory Visit: Payer: Self-pay

## 2019-06-10 ENCOUNTER — Encounter (HOSPITAL_COMMUNITY)
Admission: RE | Admit: 2019-06-10 | Discharge: 2019-06-10 | Disposition: A | Discharge status: Home / Self Care | Payer: Self-pay | Source: Ambulatory Visit | Attending: Cardiology | Admitting: Cardiology

## 2019-06-10 ENCOUNTER — Ambulatory Visit (HOSPITAL_COMMUNITY)
Admission: RE | Admit: 2019-06-10 | Discharge: 2019-06-10 | Disposition: A | Discharge status: Home / Self Care | Payer: Self-pay | Source: Ambulatory Visit | Attending: Internal Medicine | Admitting: Internal Medicine

## 2019-06-10 VITALS — BP 118/58 | HR 55 | Wt 247.8 lb

## 2019-06-10 DIAGNOSIS — Z79899 Other long term (current) drug therapy: Secondary | ICD-10-CM | POA: Insufficient documentation

## 2019-06-10 DIAGNOSIS — I5022 Chronic systolic (congestive) heart failure: Secondary | ICD-10-CM | POA: Insufficient documentation

## 2019-06-10 DIAGNOSIS — I6529 Occlusion and stenosis of unspecified carotid artery: Secondary | ICD-10-CM | POA: Insufficient documentation

## 2019-06-10 DIAGNOSIS — Z951 Presence of aortocoronary bypass graft: Secondary | ICD-10-CM

## 2019-06-10 DIAGNOSIS — I110 Hypertensive heart disease with heart failure: Secondary | ICD-10-CM | POA: Insufficient documentation

## 2019-06-10 DIAGNOSIS — I255 Ischemic cardiomyopathy: Secondary | ICD-10-CM | POA: Insufficient documentation

## 2019-06-10 DIAGNOSIS — I2510 Atherosclerotic heart disease of native coronary artery without angina pectoris: Secondary | ICD-10-CM | POA: Insufficient documentation

## 2019-06-10 DIAGNOSIS — Z7982 Long term (current) use of aspirin: Secondary | ICD-10-CM | POA: Insufficient documentation

## 2019-06-10 LAB — BASIC METABOLIC PANEL
Anion gap: 12 (ref 5–15)
BUN: 29 mg/dL — ABNORMAL HIGH (ref 8–23)
CO2: 21 mmol/L — ABNORMAL LOW (ref 22–32)
Calcium: 8.4 mg/dL — ABNORMAL LOW (ref 8.9–10.3)
Chloride: 108 mmol/L (ref 98–111)
Creatinine, Ser: 1.25 mg/dL — ABNORMAL HIGH (ref 0.61–1.24)
GFR calc Af Amer: >60 mL/min (ref >60)
GFR calc non Af Amer: >60 mL/min (ref >60)
Glucose, Bld: 97 mg/dL (ref 70–99)
Potassium: 4.3 mmol/L (ref 3.5–5.1)
Sodium: 141 mmol/L (ref 135–145)

## 2019-06-10 LAB — BRAIN NATRIURETIC PEPTIDE: B Natriuretic Peptide: 347.4 pg/mL — ABNORMAL HIGH (ref 0.0–100.0)

## 2019-06-10 MED ORDER — FARXIGA 5 MG PO TABS: 5.0000 mg | ORAL_TABLET | Freq: Every evening | ORAL | 0 refills | Status: DC

## 2019-06-10 MED ORDER — ENTRESTO 97-103 MG PO TABS: 1.0000 | ORAL_TABLET | Freq: Two times a day (BID) | ORAL | 3 refills | Status: DC

## 2019-06-10 NOTE — Patient Instructions (Signed)
It was a pleasure seeing you today!  MEDICATIONS: -We are changing your medications today -Start taking your Farxiga in the evening -Call if you have questions about your medications.  LABS: -We will call you if your labs need attention.  NEXT APPOINTMENT: Return to clinic in July with Dr. Aundra Dubin.  In general, to take care of your heart failure: -Limit your fluid intake to 2 Liters (half-gallon) per day.   -Limit your salt intake to ideally 2-3 grams (2000-3000 mg) per day. -Weigh yourself daily and record, and bring that "weight diary" to your next appointment.  (Weight gain of 2-3 pounds in 1 day typically means fluid weight.) -The medications for your heart are to help your heart and help you live longer.   -Please contact us before stopping any of your heart medications.  Call the clinic at [phone] with questions or to reschedule future appointments.

## 2019-06-10 NOTE — Progress Notes (Signed)
IW:[id number], Vernia Buff, NP Cardiologist:James Percival Spanish, MD HF Cardiology:Dr Aundra Dubin   HPI:  Frank Mclaury Christleyis a 64 y.o.malewith a history of HTN, LE edema, and elevated BNP who returns for followup of CHF and CAD.   He initially was seen by PCP in 5/20 with elevated BNP and was started on lasix with cardiology referral, lasix was subsequently increased to 40 mg bid due to ongoing LE edema. He underwent cardiac evaluation for right hip surgery in September with continued symptoms of LE edema on lasix 40 bid. Echo in [id number] showedsystolic HFwith EF 99991111.He was started on Entresto and metoprolol at this time.  He underwent cath 11/20 which showedmultivessel disease, now s/p CABGx4 12/12/18. Repeat echo done 12/17/18 showed severely decreased right ventricular systolic function with severe enlargement in addition to small pericardial effusion, otherwise similar to previous. VQ scan was negative for PE. Transferred to CIR for rehab.   He had right THR done in [id number] with no complications.  Echo was done 04/18/19 and reviewed, EF 35-40%, mild LVH, mild LV dilation, moderately decreased RV systolic function, normal RV size.   At last visit with MD on 04/18/19, he was mildly fluid overloaded and ReDS reading was elevated to 40%. His Entresto was increased to 49/51 mg BID and he was started on furosemide 20 mg daily. He was then seen by Dr. Percival Spanish on 05/02/19 and his [name] was increased further to 97/103 mg BID.   Today he returns to HF clinic for pharmacist medication titration. At last visit with PharmD on 05/13/19, he remained volume overloaded on exam with ReDS reading at 42%. His furosemide was increased to 40 mg daily x 2 days, then continued on 20 mg daily and was started on Farxiga 5 mg daily (reported history of UTIs in the past). He was approved to receive Farxiga through [employer].   Overall feeling good but does report some lightheadedness and fatigue in the mornings about 1.5  hours after taking AM medications. He denies chest pain or palpitations. His breathing is good and he denies shortness of breath. He is able to complete all ADLs and reports his activity level is improving with cardiac rehab. He no longer weighs at home because he gets weighed at cardiac rehab. Reported weights stable at 108-111 kg. He takes furosemide 20 mg daily. He denies PND or orthopnea. He has +1 pitting edema in his lower extremities L>R. ReDS today is improved from 42% to 37%. He says he is still eating too much and is trying to minimize his salt intake.   HF Medications: Furosemide 20 mg daily Carvedilol 6.25 mg BID Entresto 97/103 mg BID Spironolactone 25 mg daily Digoxin 0.125 mg daily Farxiga 5 mg daily  Has the patient been experiencing any side effects to the medications prescribed? Yes - BP too soft with all AM medications that lower BP  Does the patient have any problems obtaining medications due to transportation or finances?   Yes - no prescription insurance. Maryan Puls from [employer] patient assistance program and Wilder Glade from [employer]. Other medications from [facility]  Understanding of regimen: good Understanding of indications: good Potential of compliance: good Patient understands to avoid NSAIDs. Patient understands to avoid decongestants.    Pertinent Lab Values: . Serum creatinine 1.25, BUN 29, Potassium 4.3, Sodium 141, Digoxin 0.6, BNP 347.4  Vital Signs: . Weight: 247.8 lb (last clinic weight: 239 lb) . Blood pressure: 118/58 mmHg  . Heart rate: 55 bpm  . ReDS 37%  Assessment: 1.  CAD: S/p CABG in 11/20. No chest pain.  - Continue ASA 81 daily.  - Continue statin.Lipids at goal 2/21.  2. Chronic systolic CHF: Ischemic cardiomyopathy. Pre-CABG echo with EF 30-35%, mild RV dysfunction. Post-CABG echo with EF 30-35%, severe RV systolic dysfunction. V/Q scan done, no evidence for PE. Concerned for peri-op RV infarction. Initially  hypotensive post-CABG requiring pressors and then midodrine, but improved over time. EF 35-40% with moderate RV dysfunction. -NYHA class II now, he ismildly volume overloaded on exam and also slightly volume overloaded by ReDS clip (37%).Volume status improved from last visit.  - Labs stable: SCr 1.25, K 4.3, BNP 347.4 pg/mL - Vitals: BP 118/58, HR 55 - Continue furosemide 20 mg daily. Restrict sodium in diet. - Continue carvedilol 6.25 mg BID - Continue Entresto 97/103 mg BID - Continue spironolactone 25 mg daily - Continue digoxin 0.125 mg daily (last level 0.6 ng/mL on 04/18/19) - Continue Farxiga 5 mg daily but change to begin taking in the evenings to avoid hypotensive symptoms in the AM -  Approved to receive through [employer] patient assistance. He reported history of UTI's in the past. Increase to target 10 mg daily as tolerated.  - Patient would like BP cuff to check BP at home. States Dr. Rosezella [place] office were sending one but he has not received it yet.  -Echo is borderline for ICD. Plan for cardiac MRI to quantify EF more exactly for purposes of ICD determination. Narrow QRS so not CRT candidate.   3. Carotid stenosis - He will need repeat carotid dopplers in 11/21.     Plan: 1) Medication changes: Based on clinical presentation, vital signs and recent labs will change Farxiga to taking every evening to avoid hypotensive symptoms in AM 2) Follow-up: 08/13/19 with Dr. Johny Shock, PharmD, BCPS Heart Failure Clinic Pharmacist [phone]  Audry Riles, PharmD, BCPS, BCCP, CPP Heart Failure Clinic Pharmacist [phone]

## 2019-06-12 ENCOUNTER — Other Ambulatory Visit: Payer: Self-pay

## 2019-06-12 ENCOUNTER — Encounter (HOSPITAL_COMMUNITY)
Admission: RE | Admit: 2019-06-12 | Discharge: 2019-06-12 | Disposition: A | Discharge status: Home / Self Care | Payer: Self-pay | Source: Ambulatory Visit | Attending: Cardiology | Admitting: Cardiology

## 2019-06-12 DIAGNOSIS — Z951 Presence of aortocoronary bypass graft: Secondary | ICD-10-CM

## 2019-06-12 NOTE — Progress Notes (Signed)
Trygg Hopf Burkett 64 y.o. male Nutrition Note  Visit Diagnosis: S/P CABG x 4   Past Medical History:  Diagnosis Date  . Cancer ([place])    penile cancer  . Chronic pain of right knee   . Coronary artery disease   . Hypertension   . Phimosis      Medications reviewed.   Current Outpatient Medications:  .  aspirin EC 81 MG tablet, Take 81 mg by mouth at bedtime. , Disp: , Rfl:  .  atorvastatin (LIPITOR) 40 MG tablet, Take 1 tablet (40 mg total) by mouth daily at 6 PM., Disp: 30 tablet, Rfl: 5 .  carvedilol (COREG) 6.25 MG tablet, Take 1 tablet (6.25 mg total) by mouth 2 (two) times daily with a meal., Disp: 60 tablet, Rfl: 3 .  clotrimazole-betamethasone (LOTRISONE) cream, Apply to affected area 2 times daily, Disp: 15 g, Rfl: 1 .  dapagliflozin propanediol (FARXIGA) 5 MG TABS tablet, Take 5 mg by mouth every evening., Disp: 30 tablet, Rfl: 0 .  digoxin (LANOXIN) 0.125 MG tablet, Take 1 tablet (0.125 mg total) by mouth daily., Disp: 30 tablet, Rfl: 5 .  furosemide (LASIX) 20 MG tablet, Take 1 tablet (20 mg total) by mouth daily., Disp: 90 tablet, Rfl: 3 .  nitroGLYCERIN (NITROSTAT) 0.4 MG SL tablet, Place 1 tablet (0.4 mg total) under the tongue every 5 (five) minutes as needed for chest pain. NO more than 3 pills--if you have the need to take 2 pills or more call MD, Disp: 30 tablet, Rfl: 0 .  potassium chloride SA (KLOR-CON) 20 MEQ tablet, Take 1 tablet (20 mEq total) by mouth daily. Takes while taking furosemide, Disp: 90 tablet, Rfl: 3 .  sacubitril-valsartan (ENTRESTO) 97-103 MG, Take 1 tablet by mouth 2 (two) times daily., Disp: 180 tablet, Rfl: 3 .  spironolactone (ALDACTONE) 25 MG tablet, Take 1 tablet (25 mg total) by mouth daily., Disp: 30 tablet, Rfl: 3   Ht Readings from Last 1 Encounters:  05/28/19 6' 1.5" (1.867 m)     Wt Readings from Last 3 Encounters:  06/10/19 247 lb 12.8 oz (112.4 kg)  05/28/19 238 lb 15.7 oz (108.4 kg)  05/28/19 239 lb 8 oz (108.6 kg)      There is no height or weight on file to calculate BMI.   Social History   Tobacco Use  Smoking Status Never Smoker  Smokeless Tobacco Never Used     Lab Results  Component Value Date   CHOL 105 03/18/2019   Lab Results  Component Value Date   HDL 34 (L) 03/18/2019   Lab Results  Component Value Date   LDLCALC 61 03/18/2019   Lab Results  Component Value Date   TRIG 51 03/18/2019     Lab Results  Component Value Date   HGBA1C 5.0 12/11/2018     CBG (last 3)  No results for input(s): GLUCAP in the last 72 hours.   Nutrition Note  Spoke with pt. Nutrition Plan and Nutrition Survey goals reviewed with pt. Pt wants to lose wt. Pt has been trying to lose wt by avoid sodas, sweets, and fast food.He lost 80 lbs last year but has gained 40 lbs back. He would like to work on changes to lose 40 lbs again.   Per discussion, pt does use canned/convenience foods often. Pt does add salt to food. Pt does eat out frequently.  Reviewed lipid panel and A1C. All WNL.   Pt expressed understanding of the information reviewed.  Nutrition Diagnosis ? Obese  I = 30-34.9 related to excessive energy intake as evidenced by a BMI 31.39 kg/m2  Nutrition Intervention ? Pt's individual nutrition plan reviewed with pt. ? Benefits of adopting Heart Healthy diet discussed when Medficts reviewed.   ? Continue client-centered nutrition education by RD, as part of interdisciplinary care.  Goal(s) ? Pt to identify food quantities necessary to achieve weight loss of 6-24 lb at graduation from cardiac rehab.  ? Pt to build a healthy plate including vegetables, fruits, whole grains, and low-fat dairy products in a heart healthy meal plan.   Plan:   Will provide client-centered nutrition education as part of interdisciplinary care  Monitor and evaluate progress toward nutrition goal with team.   Michaele Offer, MS, RDN, LDN

## 2019-06-14 ENCOUNTER — Encounter (HOSPITAL_COMMUNITY)
Admission: RE | Admit: 2019-06-14 | Discharge: 2019-06-14 | Disposition: A | Discharge status: Home / Self Care | Payer: Self-pay | Source: Ambulatory Visit | Attending: Cardiology | Admitting: Cardiology

## 2019-06-14 ENCOUNTER — Other Ambulatory Visit: Payer: Self-pay

## 2019-06-14 DIAGNOSIS — Z951 Presence of aortocoronary bypass graft: Secondary | ICD-10-CM

## 2019-06-17 ENCOUNTER — Other Ambulatory Visit: Payer: Self-pay

## 2019-06-17 ENCOUNTER — Encounter (HOSPITAL_COMMUNITY)
Admission: RE | Admit: 2019-06-17 | Discharge: 2019-06-17 | Disposition: A | Discharge status: Home / Self Care | Payer: Self-pay | Source: Ambulatory Visit | Attending: Cardiology | Admitting: Cardiology

## 2019-06-17 DIAGNOSIS — Z951 Presence of aortocoronary bypass graft: Secondary | ICD-10-CM

## 2019-06-18 ENCOUNTER — Encounter: Payer: Self-pay | Admitting: Gastroenterology

## 2019-06-19 ENCOUNTER — Other Ambulatory Visit: Payer: Self-pay

## 2019-06-19 ENCOUNTER — Encounter (HOSPITAL_COMMUNITY)
Admission: RE | Admit: 2019-06-19 | Discharge: 2019-06-19 | Disposition: A | Discharge status: Home / Self Care | Payer: Self-pay | Source: Ambulatory Visit | Attending: Cardiology | Admitting: Cardiology

## 2019-06-19 DIAGNOSIS — Z951 Presence of aortocoronary bypass graft: Secondary | ICD-10-CM

## 2019-06-20 ENCOUNTER — Other Ambulatory Visit: Payer: Self-pay | Admitting: Physical Medicine and Rehabilitation

## 2019-06-20 MED FILL — ?SPIRONOLACTONE 25 MG TABLE: 25 | 30 days supply | Qty: 30 | Fill #2

## 2019-06-20 MED FILL — ?ATORVASTATIN 40MG TABLET: 40 | 30 days supply | Qty: 30 | Fill #4

## 2019-06-20 MED FILL — ?CARVEDILOL 6.25 MG TABLET: 6.25 | 30 days supply | Qty: 60 | Fill #3

## 2019-06-20 MED FILL — ?DIGITEK 125 MCG TABLET: 125 | 30 days supply | Qty: 30 | Fill #4

## 2019-06-21 ENCOUNTER — Encounter (HOSPITAL_COMMUNITY)
Admission: RE | Admit: 2019-06-21 | Discharge: 2019-06-21 | Disposition: A | Discharge status: Home / Self Care | Payer: Self-pay | Source: Ambulatory Visit | Attending: Cardiology | Admitting: Cardiology

## 2019-06-21 ENCOUNTER — Other Ambulatory Visit: Payer: Self-pay

## 2019-06-21 DIAGNOSIS — Z951 Presence of aortocoronary bypass graft: Secondary | ICD-10-CM

## 2019-06-26 ENCOUNTER — Other Ambulatory Visit: Payer: Self-pay

## 2019-06-26 ENCOUNTER — Encounter (HOSPITAL_COMMUNITY)
Admission: RE | Admit: 2019-06-26 | Discharge: 2019-06-26 | Disposition: A | Discharge status: Home / Self Care | Payer: Self-pay | Source: Ambulatory Visit | Attending: Cardiology | Admitting: Cardiology

## 2019-06-26 DIAGNOSIS — Z951 Presence of aortocoronary bypass graft: Secondary | ICD-10-CM | POA: Insufficient documentation

## 2019-06-26 DIAGNOSIS — I2510 Atherosclerotic heart disease of native coronary artery without angina pectoris: Secondary | ICD-10-CM | POA: Insufficient documentation

## 2019-06-26 NOTE — Progress Notes (Signed)
I have reviewed a Home Exercise Prescription with Gordan Payment Ackley . Arrow is not currently exercising at home.  Pt is active with work, but is not consistently setting time aside to walk for exercise.The patient was advised to walk 2-3 days a week for 30 minutes.  Jaquelyn Bitter and I discussed how to progress their exercise prescription. The patient stated that they understand the exercise prescription.  We reviewed exercise guidelines, target heart rate during exercise, RPE Scale, weather conditions, NTG use, endpoints for exercise, warmup and cool down. Patient is encouraged to come to me with any questions. I will continue to follow up with the patient to assist them with progression and safety.    Carma Lair MS, ACSM CEP 11:40 AM 06/26/2019

## 2019-06-26 NOTE — Progress Notes (Signed)
Cardiac Individual Treatment Plan  Patient Details  Name: Frank Love MRN: [id number] Date of Birth: 03/08/1955 Referring Provider:     CARDIAC REHAB [place] from 05/28/2019 in [facility]  Referring Provider  [place]      Initial Encounter Date:    CARDIAC REHAB PHASE II ORIENTATION from 05/28/2019 in [facility]  Date  05/28/19      Visit Diagnosis: S/P CABG x 4  Patient's Home Medications on Admission:  Current Outpatient Medications:    aspirin EC 81 MG tablet, Take 81 mg by mouth at bedtime. , Disp: , Rfl:    atorvastatin (LIPITOR) 40 MG tablet, Take 1 tablet (40 mg total) by mouth daily at 6 PM., Disp: 30 tablet, Rfl: 5   carvedilol (COREG) 6.25 MG tablet, Take 1 tablet (6.25 mg total) by mouth 2 (two) times daily with a meal., Disp: 60 tablet, Rfl: 3   clotrimazole-betamethasone (LOTRISONE) cream, Apply to affected area 2 times daily, Disp: 15 g, Rfl: 1   dapagliflozin propanediol (FARXIGA) 5 MG TABS tablet, Take 5 mg by mouth every evening., Disp: 30 tablet, Rfl: 0   digoxin (LANOXIN) 0.125 MG tablet, Take 1 tablet (0.125 mg total) by mouth daily., Disp: 30 tablet, Rfl: 5   furosemide (LASIX) 20 MG tablet, Take 1 tablet (20 mg total) by mouth daily., Disp: 90 tablet, Rfl: 3   nitroGLYCERIN (NITROSTAT) 0.4 MG SL tablet, Place 1 tablet (0.4 mg total) under the tongue every 5 (five) minutes as needed for chest pain. NO more than 3 pills--if you have the need to take 2 pills or more call MD, Disp: 30 tablet, Rfl: 0   potassium chloride SA (KLOR-CON) 20 MEQ tablet, Take 1 tablet (20 mEq total) by mouth daily. Takes while taking furosemide, Disp: 90 tablet, Rfl: 3   sacubitril-valsartan (ENTRESTO) 97-103 MG, Take 1 tablet by mouth 2 (two) times daily., Disp: 180 tablet, Rfl: 3   spironolactone (ALDACTONE) 25 MG tablet, Take 1 tablet (25 mg total) by mouth daily., Disp: 30 tablet, Rfl: 3  Past  Medical History: Past Medical History:  Diagnosis Date   Cancer ([place])    penile cancer   Chronic pain of right knee    Coronary artery disease    Hypertension    Phimosis     Tobacco Use: Social History   Tobacco Use  Smoking Status Never Smoker  Smokeless Tobacco Never Used    Labs: Recent Review Flowsheet Data    Labs for ITP Cardiac and Pulmonary Rehab Latest Ref Rng & Units 12/12/2018 12/13/2018 12/13/2018 12/13/2018 03/18/2019   Cholestrol 0 - 200 mg/dL - - - - 105   LDLCALC 0 - 99 mg/dL - - - - 61   HDL >40 mg/dL - - - - 34(L)   Trlycerides <150 mg/dL - - - - 51   Hemoglobin A1c 4.8 - 5.6 % - - - - -   PHART 7.350 - 7.450 7.255(L) - 7.380 7.351 -   PCO2ART 32.0 - 48.0 mmHg 49.4(H) - 36.6 40.3 -   HCO3 20.0 - 28.0 mmol/L 21.8 - 21.5 22.2 -   TCO2 22 - 32 mmol/L 23 - 23 23 -   ACIDBASEDEF 0.0 - 2.0 mmol/L 5.0(H) - 3.0(H) 3.0(H) -   O2SAT % 94.0 74.1 99.0 98.0 -      Capillary Blood Glucose: Lab Results  Component Value Date   GLUCAP 97 12/19/2018   GLUCAP 85 12/18/2018  GLUCAP 93 12/18/2018   GLUCAP 88 12/18/2018   GLUCAP 100 (H) 12/18/2018     Exercise Target Goals: Exercise Program Goal: Individual exercise prescription set using results from initial 6 min walk test and THRR while considering  patients activity barriers and safety.   Exercise Prescription Goal: Starting with aerobic activity 30 plus minutes a day, 3 days per week for initial exercise prescription. Provide home exercise prescription and guidelines that participant acknowledges understanding prior to discharge.  Activity Barriers & Risk Stratification: Activity Barriers & Cardiac Risk Stratification - 05/28/19 0954      Activity Barriers & Cardiac Risk Stratification   Activity Barriers  Joint Problems;Balance Concerns;Right Hip Replacement;Decreased Ventricular Function    Cardiac Risk Stratification  High       6 Minute Walk: 6 Minute Walk    Row Name 05/28/19 0952          6 Minute Walk   Distance  1254 feet     Walk Time  6 minutes     # of Rest Breaks  0     MPH  3.1     METS  3.1     RPE  11     Perceived Dyspnea   0     VO2 Peak  11.19     Symptoms  No     Resting HR  76 bpm     Resting BP  112/60     Resting Oxygen Saturation   98 %     Exercise Oxygen Saturation  during 6 min walk  96 %     Max Ex. HR  98 bpm     Max Ex. BP  154/69     2 Minute Post BP  131/59        Oxygen Initial Assessment:   Oxygen Re-Evaluation:   Oxygen Discharge (Final Oxygen Re-Evaluation):   Initial Exercise Prescription: Initial Exercise Prescription - 05/28/19 1200      Date of Initial Exercise RX and Referring Provider   Date  05/28/19    Referring Provider  [place]    Expected Discharge Date  07/26/19      NuStep   Level  3    SPM  60    Minutes  30    METs  2      Intensity   THRR 40-80% of Max Heartrate  63-126    Ratings of Perceived Exertion  11-13    Perceived Dyspnea  0-4      Progression   Progression  Continue progressive overload as per policy without signs/symptoms or physical distress.      Resistance Training   Training Prescription  Yes    Weight  3    Reps  10-15       Perform Capillary Blood Glucose checks as needed.  Exercise Prescription Changes:  Exercise Prescription Changes    Row Name 06/03/19 1000 06/14/19 0718 06/21/19 0716 06/26/19 1100       Response to Exercise   Blood Pressure (Admit)  98/58  100/60  102/50  102/50    Blood Pressure (Exercise)  114/70  114/58  104/70  112/60    Blood Pressure (Exit)  114/68  116/60  102/62  100/60    Heart Rate (Admit)  71 bpm  59 bpm  69 bpm  70 bpm    Heart Rate (Exercise)  82 bpm  86 bpm  79 bpm  83 bpm    Heart Rate (Exit)  62 bpm  58 bpm  65 bpm  63 bpm    Rating of Perceived Exertion (Exercise)  11  12  11  11     Perceived Dyspnea (Exercise)  0  0  0  0    Symptoms  None  None  None  None    Comments  Pt's first day of exercise   None  Pt tolerating  exercise well  Reviewed Home Exercise     Duration  Progress to 10 minutes continuous walking  at current work load and total walking time to 30-45 min  Progress to 10 minutes continuous walking  at current work load and total walking time to 30-45 min  Progress to 10 minutes continuous walking  at current work load and total walking time to 30-45 min  Progress to 30 minutes of  aerobic without signs/symptoms of physical distress    Intensity  THRR unchanged  THRR unchanged  THRR unchanged  THRR unchanged      Progression   Progression  Continue to progress workloads to maintain intensity without signs/symptoms of physical distress.  Continue to progress workloads to maintain intensity without signs/symptoms of physical distress.  Continue to progress workloads to maintain intensity without signs/symptoms of physical distress.  Continue to progress workloads to maintain intensity without signs/symptoms of physical distress.    Average METs  2.5  3.3  2.9  2.6      Resistance Training   Training Prescription  Yes  Yes  Yes  No    Weight  3lbs  4lbs  4lbs  --    Reps  10-15  10-15  10-15  --    Time  10 Minutes  10 Minutes  10 Minutes  --      NuStep   Level  2  2  2  3     SPM  85  95  90  95    Minutes  25  25  25  25     METs  2.5  3.3  2.9  2.6      Home Exercise Plan   Plans to continue exercise at  --  --  --  Home (comment) Walking    Frequency  --  --  --  Add 2 additional days to program exercise sessions.    Initial Home Exercises Provided  --  --  --  06/26/19       Exercise Comments:  Exercise Comments    Row Name 06/03/19 1040 06/26/19 1133         Exercise Comments  Pt's first day of exercise. Pt tolerated exercise well. Will continue to monitor and progress pt as tolerated.  Reviewed Home Exercise Plan with pt. Pt is continuning to respond well to exercise prescription. Will continue to monitor.         Exercise Goals and Review:  Exercise Goals    Row Name  05/28/19 0957             Exercise Goals   Increase Physical Activity  Yes       Intervention  Provide advice, education, support and counseling about physical activity/exercise needs.;Develop an individualized exercise prescription for aerobic and resistive training based on initial evaluation findings, risk stratification, comorbidities and participant's personal goals.       Expected Outcomes  Short Term: Attend rehab on a regular basis to increase amount of physical activity.;Long Term: Add in home exercise to make exercise part of routine and to increase amount of physical  activity.;Long Term: Exercising regularly at least 3-5 days a week.       Increase Strength and Stamina  Yes       Intervention  Provide advice, education, support and counseling about physical activity/exercise needs.;Develop an individualized exercise prescription for aerobic and resistive training based on initial evaluation findings, risk stratification, comorbidities and participant's personal goals.       Expected Outcomes  Short Term: Increase workloads from initial exercise prescription for resistance, speed, and METs.;Short Term: Perform resistance training exercises routinely during rehab and add in resistance training at home;Long Term: Improve cardiorespiratory fitness, muscular endurance and strength as measured by increased METs and functional capacity (6MWT)       Able to understand and use rate of perceived exertion (RPE) scale  Yes       Intervention  Provide education and explanation on how to use RPE scale       Expected Outcomes  Short Term: Able to use RPE daily in rehab to express subjective intensity level;Long Term:  Able to use RPE to guide intensity level when exercising independently       Knowledge and understanding of Target Heart Rate Range (THRR)  Yes       Intervention  Provide education and explanation of THRR including how the numbers were predicted and where they are located for reference        Expected Outcomes  Short Term: Able to state/look up THRR;Long Term: Able to use THRR to govern intensity when exercising independently;Short Term: Able to use daily as guideline for intensity in rehab       Able to check pulse independently  Yes       Intervention  Provide education and demonstration on how to check pulse in carotid and radial arteries.;Review the importance of being able to check your own pulse for safety during independent exercise       Expected Outcomes  Short Term: Able to explain why pulse checking is important during independent exercise;Long Term: Able to check pulse independently and accurately       Understanding of Exercise Prescription  Yes       Intervention  Provide education, explanation, and written materials on patient's individual exercise prescription       Expected Outcomes  Short Term: Able to explain program exercise prescription;Long Term: Able to explain home exercise prescription to exercise independently          Exercise Goals Re-Evaluation : Exercise Goals Re-Evaluation    Row Name 06/26/19 1135             Exercise Goal Re-Evaluation   Exercise Goals Review  Increase Physical Activity;Increase Strength and Stamina;Able to understand and use rate of perceived exertion (RPE) scale;Knowledge and understanding of Target Heart Rate Range (THRR);Understanding of Exercise Prescription;Able to check pulse independently       Comments  Reveiwed HEP with pt. Also reviewed weather precautions, endpoints of exercise, NTG use, PRE Scale, THRR, warmup and cool down. Pt is not currently exercising at home consistently. Pt states he will start walking for exercise 2 days a home for at least 30 minutes.       Expected Outcomes  Pt will continue to increase cardiovascular function. Pt will work to increase home exercise by walking 2-3 days for 30 minutes.           Discharge Exercise Prescription (Final Exercise Prescription Changes): Exercise Prescription  Changes - 06/26/19 1100      Response to Exercise  Blood Pressure (Admit)  102/50    Blood Pressure (Exercise)  112/60    Blood Pressure (Exit)  100/60    Heart Rate (Admit)  70 bpm    Heart Rate (Exercise)  83 bpm    Heart Rate (Exit)  63 bpm    Rating of Perceived Exertion (Exercise)  11    Perceived Dyspnea (Exercise)  0    Symptoms  None    Comments  Reviewed Home Exercise     Duration  Progress to 30 minutes of  aerobic without signs/symptoms of physical distress    Intensity  THRR unchanged      Progression   Progression  Continue to progress workloads to maintain intensity without signs/symptoms of physical distress.    Average METs  2.6      Resistance Training   Training Prescription  No      NuStep   Level  3    SPM  95    Minutes  25    METs  2.6      Home Exercise Plan   Plans to continue exercise at  Home (comment)   Walking   Frequency  Add 2 additional days to program exercise sessions.    Initial Home Exercises Provided  06/26/19       Nutrition:  Target Goals: Understanding of nutrition guidelines, daily intake of sodium 1500mg , cholesterol 200mg , calories 30% from fat and 7% or less from saturated fats, daily to have 5 or more servings of fruits and vegetables.  Biometrics: Pre Biometrics - 05/28/19 0912      Pre Biometrics   Waist Circumference  49 inches    Hip Circumference  48 inches    Waist to Hip Ratio  1.02 %    Triceps Skinfold  18 mm    % Body Fat  33.1 %    Grip Strength  40 kg    Flexibility  --   Not done due to recent total hip replacement (03/08/2019)   Single Leg Stand  2.68 seconds        Nutrition Therapy Plan and Nutrition Goals: Nutrition Therapy & Goals - 06/12/19 1045      Nutrition Therapy   Diet  Heart Healthy      Personal Nutrition Goals   Nutrition Goal  Pt to identify food quantities necessary to achieve weight loss of 6-24 lb at graduation from cardiac rehab.    Personal Goal #2  Pt to build a healthy  plate including vegetables, fruits, whole grains, and low-fat dairy products in a heart healthy meal plan.      Intervention Plan   Intervention  Nutrition handout(s) given to patient.;Prescribe, educate and counsel regarding individualized specific dietary modifications aiming towards targeted core components such as weight, hypertension, lipid management, diabetes, heart failure and other comorbidities.    Expected Outcomes  Short Term Goal: A plan has been developed with personal nutrition goals set during dietitian appointment.;Long Term Goal: Adherence to prescribed nutrition plan.       Nutrition Assessments: Nutrition Assessments - 05/30/19 0825      MEDFICTS Scores   Pre Score  41       Nutrition Goals Re-Evaluation: Nutrition Goals Re-Evaluation    Frank Love Name 06/12/19 1045             Goals   Current Weight  247 lb (112 kg)          Nutrition Goals Discharge (Final Nutrition Goals Re-Evaluation): Nutrition Goals Re-Evaluation -  06/12/19 1045      Goals   Current Weight  247 lb (112 kg)       Psychosocial: Target Goals: Acknowledge presence or absence of significant depression and/or stress, maximize coping skills, provide positive support system. Participant is able to verbalize types and ability to use techniques and skills needed for reducing stress and depression.  Initial Review & Psychosocial Screening: Initial Psych Review & Screening - 06/26/19 1441      Initial Review   Current issues with  None Identified      Barriers   Psychosocial barriers to participate in program  There are no identifiable barriers or psychosocial needs.      Screening Interventions   Interventions  Encouraged to exercise       Quality of Life Scores: Quality of Life - 05/28/19 1018      Quality of Life   Select  Quality of Life      Quality of Life Scores   Health/Function Pre  25.2 %    Socioeconomic Pre  27.93 %    Psych/Spiritual Pre  27.93 %    Family Pre  28.8  %    GLOBAL Pre  26.85 %      Scores of 19 and below usually indicate a poorer quality of life in these areas.  A difference of  2-3 points is a clinically meaningful difference.  A difference of 2-3 points in the total score of the Quality of Life Index has been associated with significant improvement in overall quality of life, self-image, physical symptoms, and general health in studies assessing change in quality of life.  PHQ-9: Recent Review Flowsheet Data    Depression screen [facility] 2/9 06/03/2019 07/06/2018 06/12/2018 04/17/2015   Decreased Interest 0 0 0 0   Down, Depressed, Hopeless 0 0 0 0   PHQ - 2 Score 0 0 0 0   Altered sleeping - 0 1 -   Tired, decreased energy - 0 0 -   Change in appetite - 0 0 -   Feeling bad or failure about yourself  - 0 0 -   Trouble concentrating - 0 0 -   Moving slowly or fidgety/restless - 0 0 -   Suicidal thoughts - 0 0 -   PHQ-9 Score - 0 1 -     Interpretation of Total Score  Total Score Depression Severity:  1-4 = Minimal depression, 5-9 = Mild depression, 10-14 = Moderate depression, 15-19 = Moderately severe depression, 20-27 = Severe depression   Psychosocial Evaluation and Intervention:   Psychosocial Re-Evaluation: Psychosocial Re-Evaluation    Row Name 06/27/19 1446             Psychosocial Re-Evaluation   Current issues with  None Identified       Interventions  Encouraged to attend Cardiac Rehabilitation for the exercise       Continue Psychosocial Services   No Follow up required          Psychosocial Discharge (Final Psychosocial Re-Evaluation): Psychosocial Re-Evaluation - 06/27/19 1446      Psychosocial Re-Evaluation   Current issues with  None Identified    Interventions  Encouraged to attend Cardiac Rehabilitation for the exercise    Continue Psychosocial Services   No Follow up required       Vocational Rehabilitation: Provide vocational rehab assistance to qualifying candidates.   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 Participant will state understanding/return demonstration of topics presented.  Learning Barriers/Preferences: Learning Barriers/Preferences - 05/28/19 1203      Learning Barriers/Preferences   Learning Barriers  Hearing    Learning Preferences  Skilled Demonstration       Education Topics: Hypertension, Hypertension Reduction -Define heart disease and high blood pressure. Discus how high blood pressure affects the body and ways to reduce high blood pressure.   Exercise and Your Heart -Discuss why it is important to exercise, the FITT principles of exercise, normal and abnormal responses to exercise, and how to exercise safely.   Angina -Discuss definition of angina, causes of angina, treatment of angina, and how to decrease risk of having angina.   Cardiac Medications -Review what the following cardiac medications are used for, how they affect the body, and side effects that may occur when taking the medications.  Medications include Aspirin, Beta blockers, calcium channel blockers, ACE Inhibitors, angiotensin receptor blockers, diuretics, digoxin, and antihyperlipidemics.   Congestive Heart Failure -Discuss the definition of CHF, how to live with CHF, the signs and symptoms of CHF, and how keep track of weight and sodium intake.   Heart Disease and Intimacy -Discus the effect sexual activity has on the heart, how changes occur during intimacy as we age, and safety during sexual activity.   Smoking Cessation / COPD -Discuss different methods to quit smoking, the  health benefits of quitting smoking, and the definition of COPD.   Nutrition I: Fats -Discuss the types of cholesterol, what cholesterol does to the heart, and how cholesterol levels can be controlled.   Nutrition II: Labels -Discuss the different components of food labels and how to read food label   Heart Parts/Heart Disease and PAD -Discuss the anatomy of the heart, the pathway of blood circulation through the heart, and these are affected by heart disease.   Stress I: Signs and Symptoms -Discuss the causes of stress, how stress may lead to anxiety and depression, and ways to limit stress.   Stress II: Relaxation -Discuss different types of relaxation techniques to limit stress.   Warning Signs of Stroke / TIA -Discuss definition of a stroke, what the signs and symptoms are of a stroke, and how to identify when someone is having stroke.   Knowledge Questionnaire Score: Knowledge Questionnaire Score - 05/28/19 1017      Knowledge Questionnaire Score   Pre Score  19/24       Core Components/Risk Factors/Patient Goals at Admission: Personal Goals and Risk Factors at Admission - 05/28/19 1204      Core Components/Risk Factors/Patient Goals on Admission    Weight Management  Yes;Obesity;Weight Loss    Admit Weight  239 lb (108.4 kg)    Hypertension  Yes    Intervention  Provide education on lifestyle modifcations including regular physical activity/exercise, weight management, moderate sodium restriction and increased consumption of fresh fruit, vegetables, and low fat dairy, alcohol moderation, and smoking cessation.;Monitor prescription use compliance.    Expected Outcomes  Short Term: Continued assessment and intervention until BP is < 140/62mm HG in hypertensive participants. < 130/72mm HG in hypertensive participants with diabetes, heart failure or chronic kidney disease.;Long Term:  Maintenance of blood pressure at goal levels.    Lipids  Yes    Intervention  Provide  education and support for participant on nutrition & aerobic/resistive exercise along with prescribed medications to achieve LDL 70mg , HDL >40mg .    Expected Outcomes  Short Term: Participant states understanding of desired cholesterol values and is compliant with medications prescribed. Participant is following exercise prescription and nutrition guidelines.;Long Term: Cholesterol controlled with medications as prescribed, with individualized exercise RX and with personalized nutrition plan. Value goals: LDL < 70mg , HDL > 40 mg.       Core Components/Risk Factors/Patient Goals Review:  Goals and Risk Factor Review    Row Name 06/03/19 1218 06/26/19 1442           Core Components/Risk Factors/Patient Goals Review   Personal Goals Review  Weight Management/Obesity;Hypertension;Lipids  Weight Management/Obesity;Hypertension;Lipids      Review  Frank Love started exercise on 06/03/19 and did well with exercise.  Frank Love has done well with exercise. Frank Love's vital signs have been stable.      Expected Outcomes  Frank Love will continue to partcipate in phase 2 cardiac rehab for exercise, nutrtion and lifestyle modifications.  Frank Love will continue to partcipate in phase 2 cardiac rehab for exercise, nutrtion and lifestyle modifications.         Core Components/Risk Factors/Patient Goals at Discharge (Final Review):  Goals and Risk Factor Review - 06/26/19 1442      Core Components/Risk Factors/Patient Goals Review   Personal Goals Review  Weight Management/Obesity;Hypertension;Lipids    Review  Frank Love has done well with exercise. Frank Love's vital signs have been stable.    Expected Outcomes  Frank Love will continue to partcipate in phase 2 cardiac rehab for exercise, nutrtion and lifestyle modifications.       ITP Comments: ITP Comments    Row Name 05/28/19 0904 06/03/19 1032 06/26/19 1438       ITP Comments  Dr. Fransico Him, Medical Director Cardiac Rehab [place]  30 Day ITP Review. Frank Love started exercise at cardiac rehab  and exercised without difficulty  30 Day ITP Review. Patient has good attendance and participation in phase 2 cardiac rehab.        Comments: See ITP comments.Barnet Pall, RN,BSN 06/27/2019 2:47 PM

## 2019-06-28 ENCOUNTER — Other Ambulatory Visit: Payer: Self-pay

## 2019-06-28 ENCOUNTER — Encounter (HOSPITAL_COMMUNITY)
Admission: RE | Admit: 2019-06-28 | Discharge: 2019-06-28 | Disposition: A | Discharge status: Home / Self Care | Payer: Self-pay | Source: Ambulatory Visit | Attending: Cardiology | Admitting: Cardiology

## 2019-06-28 DIAGNOSIS — Z951 Presence of aortocoronary bypass graft: Secondary | ICD-10-CM

## 2019-07-01 ENCOUNTER — Encounter (HOSPITAL_COMMUNITY)
Admission: RE | Admit: 2019-07-01 | Discharge: 2019-07-01 | Disposition: A | Discharge status: Home / Self Care | Payer: Self-pay | Source: Ambulatory Visit | Attending: Cardiology | Admitting: Cardiology

## 2019-07-01 ENCOUNTER — Other Ambulatory Visit: Payer: Self-pay

## 2019-07-01 DIAGNOSIS — Z951 Presence of aortocoronary bypass graft: Secondary | ICD-10-CM

## 2019-07-02 ENCOUNTER — Encounter: Payer: Self-pay | Admitting: Nurse Practitioner

## 2019-07-02 NOTE — Telephone Encounter (Signed)
Patient called for a refill of Potassium prescribed by Reesa Chew PA. Patient advised to call PCP for refills.

## 2019-07-03 ENCOUNTER — Other Ambulatory Visit: Payer: Self-pay

## 2019-07-03 ENCOUNTER — Encounter (HOSPITAL_COMMUNITY)
Admission: RE | Admit: 2019-07-03 | Discharge: 2019-07-03 | Disposition: A | Discharge status: Home / Self Care | Payer: Self-pay | Source: Ambulatory Visit | Attending: Cardiology | Admitting: Cardiology

## 2019-07-03 DIAGNOSIS — Z951 Presence of aortocoronary bypass graft: Secondary | ICD-10-CM

## 2019-07-05 ENCOUNTER — Encounter (HOSPITAL_COMMUNITY)
Admission: RE | Admit: 2019-07-05 | Discharge: 2019-07-05 | Disposition: A | Discharge status: Home / Self Care | Payer: Self-pay | Source: Ambulatory Visit | Attending: Cardiology | Admitting: Cardiology

## 2019-07-05 ENCOUNTER — Other Ambulatory Visit: Payer: Self-pay

## 2019-07-05 DIAGNOSIS — Z951 Presence of aortocoronary bypass graft: Secondary | ICD-10-CM

## 2019-07-08 ENCOUNTER — Encounter (HOSPITAL_COMMUNITY): Payer: Self-pay

## 2019-07-10 ENCOUNTER — Encounter (HOSPITAL_COMMUNITY)
Admission: RE | Admit: 2019-07-10 | Discharge: 2019-07-10 | Disposition: A | Discharge status: Home / Self Care | Payer: Self-pay | Source: Ambulatory Visit | Attending: Cardiology | Admitting: Cardiology

## 2019-07-10 DIAGNOSIS — Z951 Presence of aortocoronary bypass graft: Secondary | ICD-10-CM

## 2019-07-12 ENCOUNTER — Encounter (HOSPITAL_COMMUNITY)
Admission: RE | Admit: 2019-07-12 | Discharge: 2019-07-12 | Disposition: A | Discharge status: Home / Self Care | Payer: Self-pay | Source: Ambulatory Visit | Attending: Cardiology | Admitting: Cardiology

## 2019-07-12 ENCOUNTER — Other Ambulatory Visit: Payer: Self-pay

## 2019-07-12 DIAGNOSIS — Z951 Presence of aortocoronary bypass graft: Secondary | ICD-10-CM

## 2019-07-15 ENCOUNTER — Other Ambulatory Visit: Payer: Self-pay

## 2019-07-15 ENCOUNTER — Encounter (HOSPITAL_COMMUNITY)
Admission: RE | Admit: 2019-07-15 | Discharge: 2019-07-15 | Disposition: A | Discharge status: Home / Self Care | Payer: Self-pay | Source: Ambulatory Visit | Attending: Cardiology | Admitting: Cardiology

## 2019-07-15 DIAGNOSIS — Z951 Presence of aortocoronary bypass graft: Secondary | ICD-10-CM

## 2019-07-15 NOTE — Progress Notes (Signed)
Nutrition Note  Spoke with pt to review progress and reevaluate goals. His nutrition goal was weight loss. He has gained 6 lbs since start of rehab. He has been been limiting soda to 4 cans per week, and desserts to 2-3 x/week. He has been incorporating more fruit for lunch instead of chips. He has made some appropriate changes for weight loss.   Suspect some fluid gain as pt reports his ankles were more swollen than normal and his weight was up 7 lbs since Friday (on cardiac rehab scales - he has not been keeping daily weights at home). He reports eating out twice Saturday and twice Sunday due to celebrating father's day. Daily Lasix prescribed but he has not been taking it Saturdays and Sundays because he is out of the house those days.  We reviewed protocol for calling doctor with overnight weight gain. Recommended pt keep a daily weight log and provided a log for him. Encouraged pt to take Lasix as prescribed especially when eating high sodium meals. Finally we reviewed low sodium diet and label reading and how this can prevent edema.  Pt verbalized understanding and has not questions or concerns.   Michaele Offer, MS, RDN, LDN

## 2019-07-16 ENCOUNTER — Telehealth (HOSPITAL_COMMUNITY): Payer: Self-pay | Admitting: *Deleted

## 2019-07-16 MED FILL — ?CARVEDILOL 6.25 MG TABLET: 6.25 | 30 days supply | Qty: 60 | Fill #0

## 2019-07-16 MED FILL — ?SPIRONOLACTONE 25 MG TABLE: 25 | 30 days supply | Qty: 30 | Fill #3

## 2019-07-16 MED FILL — ?FUROSEMIDE 20MG TABLET: 20 | 30 days supply | Qty: 30 | Fill #2

## 2019-07-16 NOTE — Progress Notes (Signed)
Weight up on 07/16/19. Upon assessment lung fields clear upon assessment. Oxygen saturation 97% on room air. Bilateral lower extremity edema present. Patient denies shortness of breath. Patient says that he ate out during father's day weekend and did not take his furosemide. Will notify the heart failure clinic of weight gain. Patient was counseled by the dietitian.Barnet Pall, RN,BSN 07/16/2019 8:18 AM

## 2019-07-16 NOTE — Telephone Encounter (Signed)
Spoke with patient his home weight is 246. Pounds. Denies complaints or symptoms . Will continue to monitor the patient.Barnet Pall, RN,BSN 07/16/2019 8:23 AM

## 2019-07-17 ENCOUNTER — Other Ambulatory Visit: Payer: Self-pay

## 2019-07-17 ENCOUNTER — Encounter (HOSPITAL_COMMUNITY)
Admission: RE | Admit: 2019-07-17 | Discharge: 2019-07-17 | Disposition: A | Discharge status: Home / Self Care | Payer: Self-pay | Source: Ambulatory Visit | Attending: Cardiology | Admitting: Cardiology

## 2019-07-17 DIAGNOSIS — Z951 Presence of aortocoronary bypass graft: Secondary | ICD-10-CM

## 2019-07-17 NOTE — Progress Notes (Addendum)
Weight is 114.0 kg which is down 1 kg from Monday 07/15/19. Frank Love continues to feel pretty good and thinks that cardiac rehab has generally helped him. Denies shortness of breath. Frank Love has gained 4.5 kg since starting the program on 5/10/2. Frank Love continues to have bilateral lower extremity edema which is chronic for him. No complaints today at cardiac rehab. VSS. Frank Love called and notified about Frank Love's gradual weight gain since starting the program. Frank Love admits he has not been following a heart healthy diet like he should and exercises very little during the week but does walk on the weekend. Medications reviewed. Patient taking as prescribed Will fax exercise flow sheets to Dr. Claris Gladden  office for review. Frank Pall, Love,BSN 07/17/2019 10:38 AM

## 2019-07-18 NOTE — Progress Notes (Signed)
Cardiac Individual Treatment Plan  Patient Details  Name: Frank Love MRN: [id number] Date of Birth: 1955-05-11 Referring Provider:     CARDIAC REHAB [place] from 05/28/2019 in [place]  Referring Provider [place]      Initial Encounter Date:    CARDIAC REHAB PHASE II ORIENTATION from 05/28/2019 in [place]  Date 05/28/19      Visit Diagnosis: S/P CABG x 4  Patient's Home Medications on Admission:  Current Outpatient Medications:  .  aspirin EC 81 MG tablet, Take 81 mg by mouth at bedtime. , Disp: , Rfl:  .  atorvastatin (LIPITOR) 40 MG tablet, Take 1 tablet (40 mg total) by mouth daily at 6 PM., Disp: 30 tablet, Rfl: 5 .  carvedilol (COREG) 6.25 MG tablet, Take 1 tablet (6.25 mg total) by mouth 2 (two) times daily with a meal., Disp: 60 tablet, Rfl: 3 .  clotrimazole-betamethasone (LOTRISONE) cream, Apply to affected area 2 times daily, Disp: 15 g, Rfl: 1 .  dapagliflozin propanediol (FARXIGA) 5 MG TABS tablet, Take 5 mg by mouth every evening., Disp: 30 tablet, Rfl: 0 .  digoxin (LANOXIN) 0.125 MG tablet, Take 1 tablet (0.125 mg total) by mouth daily., Disp: 30 tablet, Rfl: 5 .  furosemide (LASIX) 20 MG tablet, Take 1 tablet (20 mg total) by mouth daily., Disp: 90 tablet, Rfl: 3 .  nitroGLYCERIN (NITROSTAT) 0.4 MG SL tablet, Place 1 tablet (0.4 mg total) under the tongue every 5 (five) minutes as needed for chest pain. NO more than 3 pills--if you have the need to take 2 pills or more call MD, Disp: 30 tablet, Rfl: 0 .  potassium chloride SA (KLOR-CON) 20 MEQ tablet, Take 1 tablet (20 mEq total) by mouth daily. Takes while taking furosemide, Disp: 90 tablet, Rfl: 3 .  sacubitril-valsartan (ENTRESTO) 97-103 MG, Take 1 tablet by mouth 2 (two) times daily., Disp: 180 tablet, Rfl: 3 .  spironolactone (ALDACTONE) 25 MG tablet, Take 1 tablet (25 mg total) by mouth daily., Disp: 30 tablet, Rfl: 3  Past  Medical History: Past Medical History:  Diagnosis Date  . Cancer ([place])    penile cancer  . Chronic pain of right knee   . Coronary artery disease   . Hypertension   . Phimosis     Tobacco Use: Social History   Tobacco Use  Smoking Status Never Smoker  Smokeless Tobacco Never Used    Labs: Recent Review Flowsheet Data    Labs for ITP Cardiac and Pulmonary Rehab Latest Ref Rng & Units 12/12/2018 12/13/2018 12/13/2018 12/13/2018 03/18/2019   Cholestrol 0 - 200 mg/dL - - - - 105   LDLCALC 0 - 99 mg/dL - - - - 61   HDL >40 mg/dL - - - - 34(L)   Trlycerides <150 mg/dL - - - - 51   Hemoglobin A1c 4.8 - 5.6 % - - - - -   PHART 7.35 - 7.45 7.255(L) - 7.380 7.351 -   PCO2ART 32 - 48 mmHg 49.4(H) - 36.6 40.3 -   HCO3 20.0 - 28.0 mmol/L 21.8 - 21.5 22.2 -   TCO2 22 - 32 mmol/L 23 - 23 23 -   ACIDBASEDEF 0.0 - 2.0 mmol/L 5.0(H) - 3.0(H) 3.0(H) -   O2SAT % 94.0 74.1 99.0 98.0 -      Capillary Blood Glucose: Lab Results  Component Value Date   GLUCAP 97 12/19/2018   GLUCAP 85 12/18/2018  GLUCAP 93 12/18/2018   GLUCAP 88 12/18/2018   GLUCAP 100 (H) 12/18/2018     Exercise Target Goals: Exercise Program Goal: Individual exercise prescription set using results from initial 6 min walk test and THRR while considering  patient's activity barriers and safety.   Exercise Prescription Goal: Starting with aerobic activity 30 plus minutes a day, 3 days per week for initial exercise prescription. Provide home exercise prescription and guidelines that participant acknowledges understanding prior to discharge.  Activity Barriers & Risk Stratification:  Activity Barriers & Cardiac Risk Stratification - 05/28/19 0954      Activity Barriers & Cardiac Risk Stratification   Activity Barriers Joint Problems;Balance Concerns;Right Hip Replacement;Decreased Ventricular Function    Cardiac Risk Stratification High           6 Minute Walk:  6 Minute Walk    Row Name 05/28/19 0952          6 Minute Walk   Distance 1254 feet     Walk Time 6 minutes     # of Rest Breaks 0     MPH 3.1     METS 3.1     RPE 11     Perceived Dyspnea  0     VO2 Peak 11.19     Symptoms No     Resting HR 76 bpm     Resting BP 112/60     Resting Oxygen Saturation  98 %     Exercise Oxygen Saturation  during 6 min walk 96 %     Max Ex. HR 98 bpm     Max Ex. BP 154/69     2 Minute Post BP 131/59            Oxygen Initial Assessment:   Oxygen Re-Evaluation:   Oxygen Discharge (Final Oxygen Re-Evaluation):   Initial Exercise Prescription:  Initial Exercise Prescription - 05/28/19 1200      Date of Initial Exercise RX and Referring Provider   Date 05/28/19    Referring Provider [place]    Expected Discharge Date 07/26/19      NuStep   Level 3    SPM 60    Minutes 30    METs 2      Intensity   THRR 40-80% of Max Heartrate 63-126    Ratings of Perceived Exertion 11-13    Perceived Dyspnea 0-4      Progression   Progression Continue progressive overload as per policy without signs/symptoms or physical distress.      Resistance Training   Training Prescription Yes    Weight 3    Reps 10-15           Perform Capillary Blood Glucose checks as needed.  Exercise Prescription Changes:  Exercise Prescription Changes    Row Name 06/03/19 1000 06/14/19 0718 06/21/19 0716 06/26/19 1100 07/17/19 1137     Response to Exercise   Blood Pressure (Admit) 98/58 100/60 102/50 102/50 106/60   Blood Pressure (Exercise) 114/70 114/58 104/70 112/60 122/70   Blood Pressure (Exit) 114/68 116/60 102/62 100/60 104/60   Heart Rate (Admit) 71 bpm 59 bpm 69 bpm 70 bpm 75 bpm   Heart Rate (Exercise) 82 bpm 86 bpm 79 bpm 83 bpm 103 bpm   Heart Rate (Exit) 62 bpm 58 bpm 65 bpm 63 bpm 62 bpm   Rating of Perceived Exertion (Exercise) 11 12 11 11 12    Perceived Dyspnea (Exercise) 0 0 0 0 0   Symptoms None None None  None None   Comments Pt's first day of exercise  None Pt tolerating  exercise well Reviewed Home Exercise  None   Duration Progress to 10 minutes continuous walking  at current work load and total walking time to 30-45 min Progress to 10 minutes continuous walking  at current work load and total walking time to 30-45 min Progress to 10 minutes continuous walking  at current work load and total walking time to 30-45 min Progress to 30 minutes of  aerobic without signs/symptoms of physical distress Continue with 30 min of aerobic exercise without signs/symptoms of physical distress.   Intensity THRR unchanged THRR unchanged THRR unchanged THRR unchanged THRR unchanged     Progression   Progression Continue to progress workloads to maintain intensity without signs/symptoms of physical distress. Continue to progress workloads to maintain intensity without signs/symptoms of physical distress. Continue to progress workloads to maintain intensity without signs/symptoms of physical distress. Continue to progress workloads to maintain intensity without signs/symptoms of physical distress. Continue to progress workloads to maintain intensity without signs/symptoms of physical distress.   Average METs 2.5 3.3 2.9 2.6 4.5     Resistance Training   Training Prescription Yes Yes Yes No No   Weight 3lbs 4lbs 4lbs -- --   Reps 10-15 10-15 10-15 -- --   Time 10 Minutes 10 Minutes 10 Minutes -- --     NuStep   Level 2 2 2 3 3    SPM 85 95 90 95 95   Minutes 25 25 25 25 25    METs 2.5 3.3 2.9 2.6 4.5     Home Exercise Plan   Plans to continue exercise at -- -- -- Home (comment)  Walking Home (comment)  Walking   Frequency -- -- -- Add 2 additional days to program exercise sessions. Add 2 additional days to program exercise sessions.   Initial Home Exercises Provided -- -- -- 06/26/19 06/26/19          Exercise Comments:  Exercise Comments    Row Name 06/03/19 1040 06/26/19 1133 07/18/19 1138       Exercise Comments Pt's first day of exercise. Pt tolerated exercise well.  Will continue to monitor and progress pt as tolerated. Reviewed Home Exercise Plan with pt. Pt is continuning to respond well to exercise prescription. Will continue to monitor. Pt is responding well to exercise prescription. Pt is consistently increasing average MET levels. Will increase pt's level on Nustep to 4, will monitor pt to see how he tolerates it.            Exercise Goals and Review:  Exercise Goals    Row Name 05/28/19 0957             Exercise Goals   Increase Physical Activity Yes       Intervention Provide advice, education, support and counseling about physical activity/exercise needs.;Develop an individualized exercise prescription for aerobic and resistive training based on initial evaluation findings, risk stratification, comorbidities and participant's personal goals.       Expected Outcomes Short Term: Attend rehab on a regular basis to increase amount of physical activity.;Long Term: Add in home exercise to make exercise part of routine and to increase amount of physical activity.;Long Term: Exercising regularly at least 3-5 days a week.       Increase Strength and Stamina Yes       Intervention Provide advice, education, support and counseling about physical activity/exercise needs.;Develop an individualized exercise prescription for aerobic and resistive  training based on initial evaluation findings, risk stratification, comorbidities and participant's personal goals.       Expected Outcomes Short Term: Increase workloads from initial exercise prescription for resistance, speed, and METs.;Short Term: Perform resistance training exercises routinely during rehab and add in resistance training at home;Long Term: Improve cardiorespiratory fitness, muscular endurance and strength as measured by increased METs and functional capacity (6MWT)       Able to understand and use rate of perceived exertion (RPE) scale Yes       Intervention Provide education and explanation on how  to use RPE scale       Expected Outcomes Short Term: Able to use RPE daily in rehab to express subjective intensity level;Long Term:  Able to use RPE to guide intensity level when exercising independently       Knowledge and understanding of Target Heart Rate Range (THRR) Yes       Intervention Provide education and explanation of THRR including how the numbers were predicted and where they are located for reference       Expected Outcomes Short Term: Able to state/look up THRR;Long Term: Able to use THRR to govern intensity when exercising independently;Short Term: Able to use daily as guideline for intensity in rehab       Able to check pulse independently Yes       Intervention Provide education and demonstration on how to check pulse in carotid and radial arteries.;Review the importance of being able to check your own pulse for safety during independent exercise       Expected Outcomes Short Term: Able to explain why pulse checking is important during independent exercise;Long Term: Able to check pulse independently and accurately       Understanding of Exercise Prescription Yes       Intervention Provide education, explanation, and written materials on patient's individual exercise prescription       Expected Outcomes Short Term: Able to explain program exercise prescription;Long Term: Able to explain home exercise prescription to exercise independently              Exercise Goals Re-Evaluation :  Exercise Goals Re-Evaluation    Bell Name 06/26/19 1135 07/18/19 1145           Exercise Goal Re-Evaluation   Exercise Goals Review Increase Physical Activity;Increase Strength and Stamina;Able to understand and use rate of perceived exertion (RPE) scale;Knowledge and understanding of Target Heart Rate Range (THRR);Understanding of Exercise Prescription;Able to check pulse independently Increase Physical Activity;Increase Strength and Stamina;Able to understand and use rate of perceived exertion  (RPE) scale;Knowledge and understanding of Target Heart Rate Range (THRR);Understanding of Exercise Prescription;Able to check pulse independently      Comments Reveiwed HEP with pt. Also reviewed weather precautions, endpoints of exercise, NTG use, PRE Scale, THRR, warmup and cool down. Pt is not currently exercising at home consistently. Pt states he will start walking for exercise 2 days a home for at least 30 minutes. Pt is still not exercising consistently. Will talk with pt about PREP Program to help with pt continue exercise post cardiac rehab.      Expected Outcomes Pt will continue to increase cardiovascular function. Pt will work to increase home exercise by walking 2-3 days for 30 minutes. Pt will continue to increase cardiovascular fitness.              Discharge Exercise Prescription (Final Exercise Prescription Changes):  Exercise Prescription Changes - 07/17/19 1137      Response  to Exercise   Blood Pressure (Admit) 106/60    Blood Pressure (Exercise) 122/70    Blood Pressure (Exit) 104/60    Heart Rate (Admit) 75 bpm    Heart Rate (Exercise) 103 bpm    Heart Rate (Exit) 62 bpm    Rating of Perceived Exertion (Exercise) 12    Perceived Dyspnea (Exercise) 0    Symptoms None    Comments None    Duration Continue with 30 min of aerobic exercise without signs/symptoms of physical distress.    Intensity THRR unchanged      Progression   Progression Continue to progress workloads to maintain intensity without signs/symptoms of physical distress.    Average METs 4.5      Resistance Training   Training Prescription No      NuStep   Level 3    SPM 95    Minutes 25    METs 4.5      Home Exercise Plan   Plans to continue exercise at Home (comment)   Walking   Frequency Add 2 additional days to program exercise sessions.    Initial Home Exercises Provided 06/26/19           Nutrition:  Target Goals: Understanding of nutrition guidelines, daily intake of sodium  <1542m, cholesterol <2075m calories 30% from fat and 7% or less from saturated fats, daily to have 5 or more servings of fruits and vegetables.  Biometrics:  Pre Biometrics - 05/28/19 0912      Pre Biometrics   Waist Circumference 49 inches    Hip Circumference 48 inches    Waist to Hip Ratio 1.02 %    Triceps Skinfold 18 mm    % Body Fat 33.1 %    Grip Strength 40 kg    Flexibility --   Not done due to recent total hip replacement (03/08/2019)   Single Leg Stand 2.68 seconds            Nutrition Therapy Plan and Nutrition Goals:  Nutrition Therapy & Goals - 06/12/19 1045      Nutrition Therapy   Diet Heart Healthy      Personal Nutrition Goals   Nutrition Goal Pt to identify food quantities necessary to achieve weight loss of 6-24 lb at graduation from cardiac rehab.    Personal Goal #2 Pt to build a healthy plate including vegetables, fruits, whole grains, and low-fat dairy products in a heart healthy meal plan.      Intervention Plan   Intervention Nutrition handout(s) given to patient.;Prescribe, educate and counsel regarding individualized specific dietary modifications aiming towards targeted core components such as weight, hypertension, lipid management, diabetes, heart failure and other comorbidities.    Expected Outcomes Short Term Goal: A plan has been developed with personal nutrition goals set during dietitian appointment.;Long Term Goal: Adherence to prescribed nutrition plan.           Nutrition Assessments:  Nutrition Assessments - 05/30/19 0825      MEDFICTS Scores   Pre Score 41           Nutrition Goals Re-Evaluation:  Nutrition Goals Re-Evaluation    Row Name 06/12/19 1045 07/15/19 0951 07/18/19 0800         Goals   Current Weight 247 lb (112 kg) 253 lb (114.8 kg) 253 lb (114.8 kg)     Nutrition Goal -- -- Pt to identify food quantities necessary to achieve weight loss of 6-24 lb at graduation from cardiac rehab.  Comment -- Pt with  weight gain of 6 lbs since start of program, not taking lasix on saturday and sunday Pt with weight gain of 6 lbs since start of program, not taking lasix on saturday and sunday       Personal Goal #2 Re-Evaluation   Personal Goal #2 -- -- Pt to build a healthy plate including vegetables, fruits, whole grains, and low-fat dairy products in a heart healthy meal plan.            Nutrition Goals Discharge (Final Nutrition Goals Re-Evaluation):  Nutrition Goals Re-Evaluation - 07/18/19 0800      Goals   Current Weight 253 lb (114.8 kg)    Nutrition Goal Pt to identify food quantities necessary to achieve weight loss of 6-24 lb at graduation from cardiac rehab.    Comment Pt with weight gain of 6 lbs since start of program, not taking lasix on saturday and sunday      Personal Goal #2 Re-Evaluation   Personal Goal #2 Pt to build a healthy plate including vegetables, fruits, whole grains, and low-fat dairy products in a heart healthy meal plan.           Psychosocial: Target Goals: Acknowledge presence or absence of significant depression and/or stress, maximize coping skills, provide positive support system. Participant is able to verbalize types and ability to use techniques and skills needed for reducing stress and depression.  Initial Review & Psychosocial Screening:  Initial Psych Review & Screening - 06/26/19 1441      Initial Review   Current issues with None Identified      Barriers   Psychosocial barriers to participate in program There are no identifiable barriers or psychosocial needs.      Screening Interventions   Interventions Encouraged to exercise           Quality of Life Scores:  Quality of Life - 05/28/19 1018      Quality of Life   Select Quality of Life      Quality of Life Scores   Health/Function Pre 25.2 %    Socioeconomic Pre 27.93 %    Psych/Spiritual Pre 27.93 %    Family Pre 28.8 %    GLOBAL Pre 26.85 %          Scores of 19 and below  usually indicate a poorer quality of life in these areas.  A difference of  2-3 points is a clinically meaningful difference.  A difference of 2-3 points in the total score of the Quality of Life Index has been associated with significant improvement in overall quality of life, self-image, physical symptoms, and general health in studies assessing change in quality of life.  PHQ-9: Recent Review Flowsheet Data    Depression screen [facility] 2/9 06/03/2019 07/06/2018 06/12/2018 04/17/2015   Decreased Interest 0 0 0 0   Down, Depressed, Hopeless 0 0 0 0   PHQ - 2 Score 0 0 0 0   Altered sleeping - 0 1 -   Tired, decreased energy - 0 0 -   Change in appetite - 0 0 -   Feeling bad or failure about yourself  - 0 0 -   Trouble concentrating - 0 0 -   Moving slowly or fidgety/restless - 0 0 -   Suicidal thoughts - 0 0 -   PHQ-9 Score - 0 1 -     Interpretation of Total Score  Total Score Depression Severity:  1-4 = Minimal depression, 5-9 =  Mild depression, 10-14 = Moderate depression, 15-19 = Moderately severe depression, 20-27 = Severe depression   Psychosocial Evaluation and Intervention:   Psychosocial Re-Evaluation:  Psychosocial Re-Evaluation    Row Name 06/27/19 1446 07/16/19 1311           Psychosocial Re-Evaluation   Current issues with None Identified None Identified      Interventions Encouraged to attend Cardiac Rehabilitation for the exercise Encouraged to attend Cardiac Rehabilitation for the exercise      Continue Psychosocial Services  No Follow up required No Follow up required             Psychosocial Discharge (Final Psychosocial Re-Evaluation):  Psychosocial Re-Evaluation - 07/16/19 1311      Psychosocial Re-Evaluation   Current issues with None Identified    Interventions Encouraged to attend Cardiac Rehabilitation for the exercise    Continue Psychosocial Services  No Follow up required           Vocational Rehabilitation: Provide vocational rehab assistance  to qualifying candidates.   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 Participant will state understanding/return demonstration of topics presented.  Learning Barriers/Preferences:  Learning Barriers/Preferences - 05/28/19 1203      Learning Barriers/Preferences   Learning Barriers Hearing    Learning Preferences Skilled Demonstration           Education Topics: Hypertension, Hypertension Reduction -Define heart disease and high blood pressure. Discus how high blood pressure affects the body and ways to reduce high blood pressure.   Exercise and Your Heart -Discuss why it is important to exercise, the FITT principles of exercise, normal and abnormal responses to exercise, and how to exercise safely.   Angina -Discuss definition of angina, causes of angina, treatment of angina, and how to decrease risk of having angina.   Cardiac Medications -Review what the following cardiac medications are used for, how they affect the body, and side effects that may occur when taking the medications.  Medications include Aspirin, Beta blockers, calcium channel blockers, ACE Inhibitors, angiotensin receptor blockers, diuretics, digoxin, and antihyperlipidemics.   Congestive Heart Failure -Discuss the definition of CHF, how to live with CHF, the signs and symptoms of CHF, and how keep track of weight and sodium intake.   Heart Disease and Intimacy -Discus the effect sexual activity has on the heart, how changes occur during intimacy as we age, and safety during sexual activity.   Smoking  Cessation / COPD -Discuss different methods to quit smoking, the health benefits of quitting smoking, and the definition of COPD.   Nutrition I: Fats -Discuss the types of cholesterol, what cholesterol does to the heart, and how cholesterol levels can be controlled.   Nutrition II: Labels -Discuss the different components of food labels and how to read food label   Heart Parts/Heart Disease and PAD -Discuss the anatomy of the heart, the pathway of blood circulation through the heart, and these are affected by heart disease.   Stress I: Signs and Symptoms -Discuss the causes of stress, how stress may lead to anxiety and depression, and  ways to limit stress.   Stress II: Relaxation -Discuss different types of relaxation techniques to limit stress.   Warning Signs of Stroke / TIA -Discuss definition of a stroke, what the signs and symptoms are of a stroke, and how to identify when someone is having stroke.   Knowledge Questionnaire Score:  Knowledge Questionnaire Score - 05/28/19 1017      Knowledge Questionnaire Score   Pre Score 19/24           Core Components/Risk Factors/Patient Goals at Admission:  Personal Goals and Risk Factors at Admission - 05/28/19 1204      Core Components/Risk Factors/Patient Goals on Admission    Weight Management Yes;Obesity;Weight Loss    Admit Weight 239 lb (108.4 kg)    Hypertension Yes    Intervention Provide education on lifestyle modifcations including regular physical activity/exercise, weight management, moderate sodium restriction and increased consumption of fresh fruit, vegetables, and low fat dairy, alcohol moderation, and smoking cessation.;Monitor prescription use compliance.    Expected Outcomes Short Term: Continued assessment and intervention until BP is < 140/30m HG in hypertensive participants. < 130/871mHG in hypertensive participants with diabetes, heart failure or chronic kidney disease.;Long Term: Maintenance of blood  pressure at goal levels.    Lipids Yes    Intervention Provide education and support for participant on nutrition & aerobic/resistive exercise along with prescribed medications to achieve LDL <7066mHDL >62m43m  Expected Outcomes Short Term: Participant states understanding of desired cholesterol values and is compliant with medications prescribed. Participant is following exercise prescription and nutrition guidelines.;Long Term: Cholesterol controlled with medications as prescribed, with individualized exercise RX and with personalized nutrition plan. Value goals: LDL < 70mg27mL > 40 mg.           Core Components/Risk Factors/Patient Goals Review:   Goals and Risk Factor Review    Row Name 06/03/19 1218 06/26/19 1442 07/18/19 1335         Core Components/Risk Factors/Patient Goals Review   Personal Goals Review Weight Management/Obesity;Hypertension;Lipids Weight Management/Obesity;Hypertension;Lipids Weight Management/Obesity;Hypertension;Lipids     Review Ray started exercise on 06/03/19 and did well with exercise. Ray has done well with exercise. Ray's vital signs have been stable. Ray has done well with exercise. Ray's vital signs have been stable. Ray has gained 4.5 kg since starting CR in May. Denies symptoms. HF clinic notified     Expected Outcomes ray will continue to partcipate in phase 2 cardiac rehab for exercise, nutrtion and lifestyle modifications. ray will continue to partcipate in phase 2 cardiac rehab for exercise, nutrtion and lifestyle modifications. Ray will continue to partcipate in phase 2 cardiac rehab for exercise, nutrtion and lifestyle modifications. May be appropriate for prep program as Ray will complete Ct on 07/24/19            Core Components/Risk Factors/Patient Goals at Discharge (Final Review):   Goals and Risk Factor Review - 07/18/19 1335      Core Components/Risk Factors/Patient Goals Review   Personal Goals Review Weight  Management/Obesity;Hypertension;Lipids    Review Ray has done well with exercise. Ray's vital signs have been stable. Ray has gained 4.5 kg since starting CR in May. Denies symptoms. HF clinic notified    Expected Outcomes Ray will continue to partcipate in phase 2 cardiac rehab for exercise, nutrtion and lifestyle modifications. May be appropriate for prep program as Ray will complete Ct on 07/24/19           ITP Comments:  ITP  Comments    Row Name 05/28/19 3606 06/03/19 1032 06/26/19 1438 07/18/19 1332     ITP Comments Dr. Fransico Him, Medical Director Cardiac Rehab  30 Day ITP Review. Ray started exercise at cardiac rehab and exercised without difficulty 30 Day ITP Review. Patient has good attendance and participation in phase 2 cardiac rehab. 30 Day ITP Review. Ray ahs good partcipation and attendance in phase 2 cardiac rehab. Ray has gradually gained weight during partcipation in phase 2 CR. HF Clinic has been notified           Comments: See ITP comments.Barnet Pall, RN,BSN 07/18/2019 1:42 PM

## 2019-07-19 ENCOUNTER — Encounter (HOSPITAL_COMMUNITY)
Admission: RE | Admit: 2019-07-19 | Discharge: 2019-07-19 | Disposition: A | Discharge status: Home / Self Care | Payer: Self-pay | Source: Ambulatory Visit | Attending: Cardiology | Admitting: Cardiology

## 2019-07-19 ENCOUNTER — Other Ambulatory Visit: Payer: Self-pay

## 2019-07-19 DIAGNOSIS — Z951 Presence of aortocoronary bypass graft: Secondary | ICD-10-CM

## 2019-07-22 ENCOUNTER — Other Ambulatory Visit: Payer: Self-pay

## 2019-07-22 ENCOUNTER — Encounter (HOSPITAL_COMMUNITY)
Admission: RE | Admit: 2019-07-22 | Discharge: 2019-07-22 | Disposition: A | Discharge status: Home / Self Care | Payer: Self-pay | Source: Ambulatory Visit | Attending: Cardiology | Admitting: Cardiology

## 2019-07-22 VITALS — Ht 73.5 in | Wt 250.4 lb

## 2019-07-22 DIAGNOSIS — Z951 Presence of aortocoronary bypass graft: Secondary | ICD-10-CM

## 2019-07-24 ENCOUNTER — Other Ambulatory Visit: Payer: Self-pay

## 2019-07-24 ENCOUNTER — Encounter (HOSPITAL_COMMUNITY)
Admission: RE | Admit: 2019-07-24 | Discharge: 2019-07-24 | Disposition: A | Discharge status: Home / Self Care | Payer: Self-pay | Source: Ambulatory Visit | Attending: Cardiology | Admitting: Cardiology

## 2019-07-24 DIAGNOSIS — Z951 Presence of aortocoronary bypass graft: Secondary | ICD-10-CM

## 2019-07-24 NOTE — Progress Notes (Signed)
Discharge Progress Report  Patient Details  Name: Frank Love MRN: [id number] Date of Birth: Mar 15, 1955 Referring Provider:     CARDIAC REHAB PHASE II ORIENTATION from 05/28/2019 in [place]  Referring Provider Sims       Number of Visits: 22  Reason for Discharge:  Patient reached a stable level of exercise. Patient independent in their exercise. Patient has met program and personal goals.  Smoking History:  Social History   Tobacco Use  Smoking Status Never Smoker  Smokeless Tobacco Never Used    Diagnosis:  S/P CABG x 4  ADL UCSD:   Initial Exercise Prescription:  Initial Exercise Prescription - 05/28/19 1200      Date of Initial Exercise RX and Referring Provider   Date 05/28/19    Referring Provider Hochrein    Expected Discharge Date 07/26/19      NuStep   Level 3    SPM 60    Minutes 30    METs 2      Intensity   THRR 40-80% of Max Heartrate 63-126    Ratings of Perceived Exertion 11-13    Perceived Dyspnea 0-4      Progression   Progression Continue progressive overload as per policy without signs/symptoms or physical distress.      Resistance Training   Training Prescription Yes    Weight 3    Reps 10-15           Discharge Exercise Prescription (Final Exercise Prescription Changes):  Exercise Prescription Changes - 07/26/19 0900      Response to Exercise   Blood Pressure (Admit) 98/58    Blood Pressure (Exercise) 110/52    Blood Pressure (Exit) 98/60    Heart Rate (Admit) 60 bpm    Heart Rate (Exercise) 112 bpm    Heart Rate (Exit) 60 bpm    Rating of Perceived Exertion (Exercise) 12    Perceived Dyspnea (Exercise) 0    Symptoms None    Comments None    Duration Continue with 30 min of aerobic exercise without signs/symptoms of physical distress.    Intensity THRR unchanged      Progression   Progression Continue to progress workloads to maintain intensity without signs/symptoms of  physical distress.    Average METs 5      Resistance Training   Training Prescription Yes    Weight 6lbs    Reps 10-15    Time 10 Minutes      NuStep   Level 3    SPM 100    Minutes 25    METs 5      Home Exercise Plan   Plans to continue exercise at Home (comment)   Walking   Frequency Add 3 additional days to program exercise sessions.    Initial Home Exercises Provided 06/26/19           Functional Capacity:  6 Minute Walk    Row Name 05/28/19 0952 07/24/19 0732       6 Minute Walk   Phase -- Discharge    Distance 1254 feet 1660 feet    Distance % Change -- 32.38 %    Distance Feet Change -- 406 ft    Walk Time 6 minutes 6 minutes    # of Rest Breaks 0 0    MPH 3.1 3.14    METS 3.1 3.6    RPE 11 13    Perceived Dyspnea  0 0  VO2 Peak 11.19 12.5    Symptoms No No    Resting HR 76 bpm 59 bpm    Resting BP 112/60 124/60    Resting Oxygen Saturation  98 % --    Exercise Oxygen Saturation  during 6 min walk 96 % --    Max Ex. HR 98 bpm 95 bpm    Max Ex. BP 154/69 128/72    2 Minute Post BP 131/59 102/64           Psychological, QOL, Others - Outcomes: PHQ 2/9: Depression screen [facility] 2/9 07/24/2019 06/03/2019 07/06/2018 06/12/2018 04/17/2015  Decreased Interest 0 0 0 0 0  Down, Depressed, Hopeless 0 0 0 0 0  PHQ - 2 Score 0 0 0 0 0  Altered sleeping - - 0 1 -  Tired, decreased energy - - 0 0 -  Change in appetite - - 0 0 -  Feeling bad or failure about yourself  - - 0 0 -  Trouble concentrating - - 0 0 -  Moving slowly or fidgety/restless - - 0 0 -  Suicidal thoughts - - 0 0 -  PHQ-9 Score - - 0 1 -    Quality of Life:  Quality of Life - 07/25/19 1637      Quality of Life Scores   Health/Function Pre 25.2 %    Health/Function Post 25.67 %    Health/Function % Change 1.87 %    Socioeconomic Pre 26.36 %    Socioeconomic Post 26 %    Socioeconomic % Change  -1.37 %    Psych/Spiritual Pre 27.93 %    Psych/Spiritual Post 27.43 %    Psych/Spiritual  % Change -1.79 %    Family Pre 28.8 %    Family Post 27.6 %    Family % Change -4.17 %    GLOBAL Pre 26.85 %    GLOBAL Post 26.46 %    GLOBAL % Change -1.45 %           Personal Goals: Goals established at orientation with interventions provided to work toward goal.  Personal Goals and Risk Factors at Admission - 05/28/19 1204      Core Components/Risk Factors/Patient Goals on Admission    Weight Management Yes;Obesity;Weight Loss    Admit Weight 239 lb (108.4 kg)    Hypertension Yes    Intervention Provide education on lifestyle modifcations including regular physical activity/exercise, weight management, moderate sodium restriction and increased consumption of fresh fruit, vegetables, and low fat dairy, alcohol moderation, and smoking cessation.;Monitor prescription use compliance.    Expected Outcomes Short Term: Continued assessment and intervention until BP is < 140/49m HG in hypertensive participants. < 130/838mHG in hypertensive participants with diabetes, heart failure or chronic kidney disease.;Long Term: Maintenance of blood pressure at goal levels.    Lipids Yes    Intervention Provide education and support for participant on nutrition & aerobic/resistive exercise along with prescribed medications to achieve LDL <7066mHDL >69m43m  Expected Outcomes Short Term: Participant states understanding of desired cholesterol values and is compliant with medications prescribed. Participant is following exercise prescription and nutrition guidelines.;Long Term: Cholesterol controlled with medications as prescribed, with individualized exercise RX and with personalized nutrition plan. Value goals: LDL < 70mg9mL > 40 mg.            Personal Goals Discharge:  Goals and Risk Factor Review    Row Name 06/03/19 1218 06/26/19 1442 07/18/19 1335  Core Components/Risk Factors/Patient Goals Review   Personal Goals Review Weight Management/Obesity;Hypertension;Lipids Weight  Management/Obesity;Hypertension;Lipids Weight Management/Obesity;Hypertension;Lipids     Review Ray started exercise on 06/03/19 and did well with exercise. Ray has done well with exercise. Ray's vital signs have been stable. Ray has done well with exercise. Ray's vital signs have been stable. Ray has gained 4.5 kg since starting CR in May. Denies symptoms. HF clinic notified     Expected Outcomes ray will continue to partcipate in phase 2 cardiac rehab for exercise, nutrtion and lifestyle modifications. ray will continue to partcipate in phase 2 cardiac rehab for exercise, nutrtion and lifestyle modifications. Ray will continue to partcipate in phase 2 cardiac rehab for exercise, nutrtion and lifestyle modifications. May be appropriate for prep program as Ray will complete Ct on 07/24/19            Exercise Goals and Review:  Exercise Goals    Row Name 05/28/19 0957             Exercise Goals   Increase Physical Activity Yes       Intervention Provide advice, education, support and counseling about physical activity/exercise needs.;Develop an individualized exercise prescription for aerobic and resistive training based on initial evaluation findings, risk stratification, comorbidities and participant's personal goals.       Expected Outcomes Short Term: Attend rehab on a regular basis to increase amount of physical activity.;Long Term: Add in home exercise to make exercise part of routine and to increase amount of physical activity.;Long Term: Exercising regularly at least 3-5 days a week.       Increase Strength and Stamina Yes       Intervention Provide advice, education, support and counseling about physical activity/exercise needs.;Develop an individualized exercise prescription for aerobic and resistive training based on initial evaluation findings, risk stratification, comorbidities and participant's personal goals.       Expected Outcomes Short Term: Increase workloads from initial  exercise prescription for resistance, speed, and METs.;Short Term: Perform resistance training exercises routinely during rehab and add in resistance training at home;Long Term: Improve cardiorespiratory fitness, muscular endurance and strength as measured by increased METs and functional capacity (6MWT)       Able to understand and use rate of perceived exertion (RPE) scale Yes       Intervention Provide education and explanation on how to use RPE scale       Expected Outcomes Short Term: Able to use RPE daily in rehab to express subjective intensity level;Long Term:  Able to use RPE to guide intensity level when exercising independently       Knowledge and understanding of Target Heart Rate Range (THRR) Yes       Intervention Provide education and explanation of THRR including how the numbers were predicted and where they are located for reference       Expected Outcomes Short Term: Able to state/look up THRR;Long Term: Able to use THRR to govern intensity when exercising independently;Short Term: Able to use daily as guideline for intensity in rehab       Able to check pulse independently Yes       Intervention Provide education and demonstration on how to check pulse in carotid and radial arteries.;Review the importance of being able to check your own pulse for safety during independent exercise       Expected Outcomes Short Term: Able to explain why pulse checking is important during independent exercise;Long Term: Able to check pulse independently and accurately  Understanding of Exercise Prescription Yes       Intervention Provide education, explanation, and written materials on patient's individual exercise prescription       Expected Outcomes Short Term: Able to explain program exercise prescription;Long Term: Able to explain home exercise prescription to exercise independently              Exercise Goals Re-Evaluation:  Exercise Goals Re-Evaluation    Row Name 06/26/19 1135  07/18/19 1145 08/01/19 0949         Exercise Goal Re-Evaluation   Exercise Goals Review Increase Physical Activity;Increase Strength and Stamina;Able to understand and use rate of perceived exertion (RPE) scale;Knowledge and understanding of Target Heart Rate Range (THRR);Understanding of Exercise Prescription;Able to check pulse independently Increase Physical Activity;Increase Strength and Stamina;Able to understand and use rate of perceived exertion (RPE) scale;Knowledge and understanding of Target Heart Rate Range (THRR);Understanding of Exercise Prescription;Able to check pulse independently Increase Physical Activity;Increase Strength and Stamina;Able to understand and use rate of perceived exertion (RPE) scale;Knowledge and understanding of Target Heart Rate Range (THRR);Understanding of Exercise Prescription;Able to check pulse independently     Comments Reveiwed HEP with pt. Also reviewed weather precautions, endpoints of exercise, NTG use, PRE Scale, THRR, warmup and cool down. Pt is not currently exercising at home consistently. Pt states he will start walking for exercise 2 days a home for at least 30 minutes. Pt is still not exercising consistently. Will talk with pt about PREP Program to help with pt continue exercise post cardiac rehab. Pt completed 22 sessions of cardiac rehab. Pt increased his fuctional capacity by 32.4% and increased his post 48mt distance by 4065f Pt's goal was to increase his mobility, pt feels that he is able to walk more and complete his activities of daily living with ease despite recent hip surgery.     Expected Outcomes Pt will continue to increase cardiovascular function. Pt will work to increase home exercise by walking 2-3 days for 30 minutes. Pt will continue to increase cardiovascular fitness. Pt will continue to walk for exercise 5-6 days a week for 30-45 minutes. Pt will continue to increase strength and stamina.            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)   Single Leg Stand 2.68 seconds           Post Biometrics - 07/24/19 0733       Post  Biometrics   Height 6' 1.5" (1.867 m)    Weight 113.6 kg    Waist Circumference 49 inches    Hip Circumference 47 inches    Waist to Hip Ratio 1.04 %    BMI (Calculated) 32.59    Triceps Skinfold 17 mm    % Body Fat 33.3 %    Grip Strength 41 kg    Flexibility 0 in    Single Leg Stand 2.12 seconds           Nutrition:  Nutrition Therapy & Goals - 06/12/19 1045      Nutrition Therapy   Diet Heart Healthy  Personal Nutrition Goals   Nutrition Goal Pt to identify food quantities necessary to achieve weight loss of 6-24 lb at graduation from cardiac rehab.    Personal Goal #2 Pt to build a healthy plate including vegetables, fruits, whole grains, and low-fat dairy products in a heart healthy meal plan.      Intervention Plan   Intervention Nutrition handout(s) given to patient.;Prescribe, educate and counsel regarding individualized specific dietary modifications aiming towards targeted core components such as weight, hypertension, lipid management, diabetes, heart failure and other comorbidities.    Expected Outcomes Short Term Goal: A plan has been developed with personal nutrition goals set during dietitian appointment.;Long Term Goal: Adherence to prescribed nutrition plan.           Nutrition Discharge:  Nutrition Assessments - 05/30/19 0825      MEDFICTS Scores   Pre Score 41           Education Questionnaire Score:  Knowledge Questionnaire Score - 07/25/19 1634      Knowledge Questionnaire Score   Pre Score 19/24    Post Score 22/24           Goals reviewed  with patient; copy given to patient.Ray graduated from cardiac rehab program on 07/24/19 with completion of 22 exercise sessions in Phase II. Pt maintained good attendance and progressed nicely during his participation in rehab as evidenced by increased MET level.   Medication list reconciled. Repeat  PHQ score- 0 .  Pt has made significant lifestyle changes and should be commended for his success. Pt feels he has achieved his goals during cardiac rehab.   Pt plans to continue exercise by walking 5-6 days a week. Ray increased his distance on his post exercise walk test by 406 feet. Ray did not loose weight but says that he feels stronger since he has participated in the program. We are proud of Ray's progress!Barnet Pall, RN,BSN 08/08/2019 8:36 AM

## 2019-07-25 MED FILL — ?ATORVASTATIN 40MG TABLET: 40 | 30 days supply | Qty: 30 | Fill #5

## 2019-07-26 ENCOUNTER — Encounter (HOSPITAL_COMMUNITY)
Admission: RE | Admit: 2019-07-26 | Discharge: 2019-07-26 | Disposition: A | Discharge status: Home / Self Care | Payer: Self-pay | Source: Ambulatory Visit | Attending: Cardiology | Admitting: Cardiology

## 2019-07-26 ENCOUNTER — Other Ambulatory Visit: Payer: Self-pay

## 2019-07-26 DIAGNOSIS — I2510 Atherosclerotic heart disease of native coronary artery without angina pectoris: Secondary | ICD-10-CM | POA: Insufficient documentation

## 2019-07-26 DIAGNOSIS — Z951 Presence of aortocoronary bypass graft: Secondary | ICD-10-CM

## 2019-07-30 MED FILL — ?DIGITEK 125 MCG TABLET: 125 | 30 days supply | Qty: 30 | Fill #5

## 2019-08-01 ENCOUNTER — Telehealth: Payer: Self-pay | Admitting: Nurse Practitioner

## 2019-08-01 NOTE — Telephone Encounter (Signed)
I call the Pt, since in his chart his CAFA does not exp until 10/18/19, is too soon to re-apply for CAFA

## 2019-08-02 ENCOUNTER — Ambulatory Visit: Payer: Self-pay | Attending: Nurse Practitioner

## 2019-08-02 ENCOUNTER — Other Ambulatory Visit: Payer: Self-pay

## 2019-08-02 ENCOUNTER — Ambulatory Visit: Payer: Self-pay

## 2019-08-05 ENCOUNTER — Telehealth: Payer: Self-pay | Admitting: Nurse Practitioner

## 2019-08-05 NOTE — Telephone Encounter (Signed)
Copied from [place] [phone]. Topic: General - Other >> Aug 05, 2019 12:13 PM Sheran Luz wrote: Patient requesting to speak with Clifton James regarding [facility] card. , Pt was call and explain what he need to do to get the [facility] card

## 2019-08-13 ENCOUNTER — Encounter (HOSPITAL_COMMUNITY): Payer: Self-pay | Admitting: Cardiology

## 2019-08-13 ENCOUNTER — Ambulatory Visit (HOSPITAL_COMMUNITY)
Admission: RE | Admit: 2019-08-13 | Discharge: 2019-08-13 | Disposition: A | Discharge status: Home / Self Care | Payer: Self-pay | Source: Ambulatory Visit | Attending: Cardiology | Admitting: Cardiology

## 2019-08-13 ENCOUNTER — Other Ambulatory Visit: Payer: Self-pay

## 2019-08-13 ENCOUNTER — Other Ambulatory Visit: Payer: Self-pay | Admitting: Cardiology

## 2019-08-13 VITALS — BP 118/62 | HR 58 | Wt 253.0 lb

## 2019-08-13 DIAGNOSIS — I6523 Occlusion and stenosis of bilateral carotid arteries: Secondary | ICD-10-CM | POA: Insufficient documentation

## 2019-08-13 DIAGNOSIS — I255 Ischemic cardiomyopathy: Secondary | ICD-10-CM | POA: Insufficient documentation

## 2019-08-13 DIAGNOSIS — I5022 Chronic systolic (congestive) heart failure: Secondary | ICD-10-CM | POA: Insufficient documentation

## 2019-08-13 DIAGNOSIS — I110 Hypertensive heart disease with heart failure: Secondary | ICD-10-CM | POA: Insufficient documentation

## 2019-08-13 DIAGNOSIS — Z79899 Other long term (current) drug therapy: Secondary | ICD-10-CM | POA: Insufficient documentation

## 2019-08-13 DIAGNOSIS — Z951 Presence of aortocoronary bypass graft: Secondary | ICD-10-CM | POA: Insufficient documentation

## 2019-08-13 DIAGNOSIS — Z7901 Long term (current) use of anticoagulants: Secondary | ICD-10-CM | POA: Insufficient documentation

## 2019-08-13 DIAGNOSIS — Z8249 Family history of ischemic heart disease and other diseases of the circulatory system: Secondary | ICD-10-CM | POA: Insufficient documentation

## 2019-08-13 DIAGNOSIS — Z7982 Long term (current) use of aspirin: Secondary | ICD-10-CM | POA: Insufficient documentation

## 2019-08-13 DIAGNOSIS — Z7984 Long term (current) use of oral hypoglycemic drugs: Secondary | ICD-10-CM | POA: Insufficient documentation

## 2019-08-13 DIAGNOSIS — I2510 Atherosclerotic heart disease of native coronary artery without angina pectoris: Secondary | ICD-10-CM | POA: Insufficient documentation

## 2019-08-13 LAB — COMPREHENSIVE METABOLIC PANEL
ALT: 16 U/L (ref 0–44)
AST: 20 U/L (ref 15–41)
Albumin: 3.5 g/dL (ref 3.5–5.0)
Alkaline Phosphatase: 62 U/L (ref 38–126)
Anion gap: 10 (ref 5–15)
BUN: 27 mg/dL — ABNORMAL HIGH (ref 8–23)
CO2: 22 mmol/L (ref 22–32)
Calcium: 8.8 mg/dL — ABNORMAL LOW (ref 8.9–10.3)
Chloride: 109 mmol/L (ref 98–111)
Creatinine, Ser: 1.4 mg/dL — ABNORMAL HIGH (ref 0.61–1.24)
GFR calc Af Amer: >60 mL/min (ref >60)
GFR calc non Af Amer: 53 mL/min — ABNORMAL LOW (ref >60)
Glucose, Bld: 121 mg/dL — ABNORMAL HIGH (ref 70–99)
Potassium: 4.3 mmol/L (ref 3.5–5.1)
Sodium: 141 mmol/L (ref 135–145)
Total Bilirubin: 0.8 mg/dL (ref 0.3–1.2)
Total Protein: 7 g/dL (ref 6.5–8.1)

## 2019-08-13 LAB — LIPID PANEL
Cholesterol: 104 mg/dL (ref 0–200)
HDL: 40 mg/dL — ABNORMAL LOW (ref >40)
LDL Cholesterol: 37 mg/dL (ref 0–99)
Total CHOL/HDL Ratio: 2.6 RATIO
Triglycerides: 136 mg/dL (ref <150)
VLDL: 27 mg/dL (ref 0–40)

## 2019-08-13 LAB — DIGOXIN LEVEL: Digoxin Level: 0.3 ng/mL — ABNORMAL LOW (ref 0.8–2.0)

## 2019-08-13 MED FILL — ?FUROSEMIDE 20MG TABLET: 20 | 30 days supply | Qty: 30 | Fill #3

## 2019-08-13 MED FILL — ?CARVEDILOL 6.25 MG TABLET: 6.25 | 30 days supply | Qty: 60 | Fill #1

## 2019-08-13 MED FILL — POTASSIUM CL ER 20 MEQ TABL: 20 | 30 days supply | Qty: 30 | Fill #1

## 2019-08-13 NOTE — Progress Notes (Signed)
ReDS Vest / Clip - 08/13/19 1400      ReDS Vest / Clip   Station Marker D    Ruler Value 37    ReDS Value Range Moderate volume overload    ReDS Actual Value 38

## 2019-08-13 NOTE — Patient Instructions (Addendum)
Labs done today. We will contact you only if your labs are abnormal.  INCREASE Lasix 40mg (2 tablets) by mouth for 2 DAYS, THEN only take lasix 20mg (1 tablet) by mouth daily.  INCREASE Potassium 48meq (2 tablets) by mouth for 2 days, THEN only take potassium 66meq (1 tablet) by mouth daily.  No other medication changes were made. Please continue all current medications as prescribed.   Your physician recommends that you schedule a follow-up appointment in: 3 months.  If you have any questions or concerns before your next appointment please send [place] a message through [place] or call our office at [phone].    TO LEAVE A MESSAGE FOR THE NURSE SELECT OPTION 2, PLEASE LEAVE A MESSAGE INCLUDING: . YOUR NAME . DATE OF BIRTH . CALL BACK NUMBER . REASON FOR CALL**this is important as we prioritize the call backs  YOU WILL RECEIVE A CALL BACK THE SAME DAY AS LONG AS YOU CALL BEFORE 4:00 PM   Do the following things EVERYDAY: 1) Weigh yourself in the morning before breakfast. Write it down and keep it in a log. 2) Take your medicines as prescribed 3) Eat low salt foods--Limit salt (sodium) to 2000 mg per day.  4) Stay as active as you can everyday 5) Limit all fluids for the day to less than 2 liters   At the [facility], you and your health needs are our priority. As part of our continuing mission to provide you with exceptional heart care, we have created designated Provider Care Teams. These Care Teams include your primary Cardiologist (physician) and Advanced Practice Providers (APPs- Physician Assistants and Nurse Practitioners) who all work together to provide you with the care you need, when you need it.   You may see any of the following providers on your designated Care Team at your next follow up: Marland Kitchen Dr Glori Bickers . Dr [name] . Darrick Grinder, NP . Lyda Jester, PA . Audry Riles, PharmD   Please be sure to bring in all your medications bottles to  every appointment.

## 2019-08-13 NOTE — Progress Notes (Signed)
Date:  08/13/2019   ID:  Frank Love, DOB 1955/02/08, MRN [id number]   Provider location: [facility] Type of Visit: Established patient   PCP:  [name], NP  Cardiologist:  [name], MD HF Cardiology: Dr Aundra Dubin    History of Present Illness: Frank Love is a 64 y.o. male with a history of HTN, LE edema, and elevated BNP who returns for followup of CHF and CAD.   He initially was seen by PCP in 5/20 with elevated BNP and was started on lasix with cardiology referral, lasix was subsequently increased to 40 mg bid due to ongoing LE edema. He underwent cardiac evaluation for right hip surgery in September with continued symptoms of LE edema on lasix 40 bid. Echo in 2/83 showed systolic HF with EF 66-29%. He was started on entresto, metoprolol at this time.   He underwent cath 11/20 which showed multivessel disease, now s/p CABGx4 12/12/18.  Repeat echo done 12/17/18 showed severely decreased right ventricular systolic function with severe enlargement in addition to small pericardial effusion, otherwise similar to previous. VQ scan was negative for PE. Transferred to CIR for rehab.   He had right THR done in 4/76 with no complications.   Echo repeated 03/2019, EF 35-40%, mild LVH, mild LV dilation, moderately decreased RV systolic function, normal RV size.  Dr. Aundra Dubin had recommended cardiac MRI to quantify EF more exactly for purposes of ICD determination. This has not been done yet.   At last OV in 3/21, he was volume overloaded. ReDs clip was 40%. He was started on Lasix 20 mg daily and Entresto was increased to 49/51 mg bid. He has seen PharmD on subsequent visits and has had further increase in Entresto to 97-103 bid. At lasix visit w/ PharmD in May, Farxiga 5 mg was added. ReDs Clip at that visit was 37%.   Also of note, he had a sleep study with mild OSA about 2 years ago but has lost a lot of weight since and does not snore and has not  daytime sleepiness.     He returns to clinic today for f/u. Doing well. Graduated cardiac rehab last month.  No exertional symptoms. NYHA Class II. Denies CP. No palpitations, syncope/ near syncope, orthopnea or PND. Has mild chronic LE ankle edema but stable.   His wt is up since his last visit w/ Dr. Aundra Dubin in March from 241>>253 lb lb, but he attributes this to eating more over the last several months. He reports gradual wt gain.   ReDs clip mildly elevated at 38% today. Admits to recent dietary indiscretion w/ sodium. Eating out at restaurants lately. Compliant w/ medications.   Wt discussed recommendations for cRMI to better quantify EF for ICD determination. Pt reports that he does not plan to complete study as he is adamant that he does not want an ICD.   ECG: not performed today. RRR on exam   REDS clip: 38%  Labs (2/21): K 4.7, creatinine 1.07, LDL 61 Labs (3/21): K 4.3, creatinine 1.05, digoxin 0.9 Labs (5/21): K 4.3, creatinine 1.25   PMH: 1. OA: s/p right THR.  2. HTN 3. Carotid stenosis: Carotid dopplers (11/20) with 40-59% RICA stenosis.  4. CAD: LHC in 11/20 with totally occluded OM, totally occluded mid LAD, totally occluded mid RCA.  - CABG 11/20 with LIMA-LAD, RIMA-PDA, SVG-OM1, SVG-AM 5. Chronic systolic CHF: Ischemic cardiomyopathy.  - Echo (9/20): EF 30-35%, mildly decreased RV systolic  function.  - Echo (11/20): EF 30-35%, severely dilated RV with severely decreased systolic function. - Echo (3/21): EF 35-40%, mild LV dilation with mild LVH, moderately decreased RV systolic function with normal RV size, PASP 33 mmHg.   6. V/Q scan negative for PE in 11/20.   Past Surgical History:  Procedure Laterality Date  . CIRCUMCISION N/A 10/12/2018   Procedure: CIRCUMCISION ADULT;  Surgeon: Franchot Gallo, MD;  Location: [facility];  Service: Urology;  Laterality: N/A;  45 mins  . CO2 LASER APPLICATION N/A 0/01/7791   Procedure: CO2 LASER APPLICATION;  Surgeon: Franchot Gallo, MD;  [facility]: [facility];  Service: Urology;  Laterality: N/A;  30 MINS  . COLONOSCOPY    . CORONARY ARTERY BYPASS GRAFT N/A 12/12/2018   Procedure: CORONARY ARTERY BYPASS GRAFTING (CABG) x four, using bilateral internal mammary arteries and right leg greater saphenous vein harvested endoscopically;  Surgeon: [name], MD;  Location: [place];  Service: Open Heart Surgery;  Laterality: N/A;  . LEFT HEART CATH AND CORONARY ANGIOGRAPHY N/A 11/28/2018   Procedure: LEFT HEART CATH AND CORONARY ANGIOGRAPHY;  Surgeon: Burnell Blanks, MD;  Location: [facility];  Service: Cardiovascular;  Laterality: N/A;  . MEDIASTINAL EXPLORATION N/A 12/12/2018   Procedure: Mediastinal Re-Exploration after bypass graft;  Surgeon: [name], MD;  Location: [facility];  Service: Open Heart Surgery;  Laterality: N/A;  . TEE WITHOUT CARDIOVERSION N/A 12/12/2018   Procedure: TRANSESOPHAGEAL ECHOCARDIOGRAM (TEE);  Surgeon: [name], MD;  Location: [facility];  Service: Open Heart Surgery;  Laterality: N/A;  . TOOTH EXTRACTION    . TOTAL HIP ARTHROPLASTY Right 03/08/2019   Procedure: RIGHT TOTAL HIP ARTHROPLASTY ANTERIOR APPROACH;  Surgeon: Mcarthur Rossetti, MD;  [facility];  Service: Orthopedics;  Laterality: Right;     Current Outpatient Medications  Medication Sig Dispense Refill  . aspirin EC 81 MG tablet Take 81 mg by mouth at bedtime.     Marland Kitchen atorvastatin (LIPITOR) 40 MG tablet Take 1 tablet (40 mg total) by mouth daily at 6 PM. 30 tablet 5  . carvedilol (COREG) 6.25 MG tablet Take 1 tablet (6.25 mg total) by mouth 2 (two) times daily with a meal. 60 tablet 3  . dapagliflozin propanediol (FARXIGA) 5 MG TABS tablet Take 5 mg by mouth every evening. 30 tablet 0  . digoxin (LANOXIN) 0.125 MG tablet Take 1 tablet (0.125 mg total) by mouth daily. 30 tablet 5  . furosemide (LASIX) 20 MG tablet Take 1 tablet (20 mg total) by mouth daily. 90 tablet 3  . potassium chloride SA  (KLOR-CON) 20 MEQ tablet Take 1 tablet (20 mEq total) by mouth daily. Takes while taking furosemide 90 tablet 3  . sacubitril-valsartan (ENTRESTO) 97-103 MG Take 1 tablet by mouth 2 (two) times daily. 180 tablet 3  . spironolactone (ALDACTONE) 25 MG tablet Take 1 tablet (25 mg total) by mouth daily. 30 tablet 3  . nitroGLYCERIN (NITROSTAT) 0.4 MG SL tablet Place 1 tablet (0.4 mg total) under the tongue every 5 (five) minutes as needed for chest pain. NO more than 3 pills--if you have the need to take 2 pills or more call MD (Patient not taking: Reported on 08/13/2019) 30 tablet 0   No current facility-administered medications for this encounter.    Allergies:   Patient has no known allergies.   Social History:  The patient  reports that he has never smoked. He has never used smokeless tobacco. He reports that  he does not drink alcohol and does not use drugs.   Family History:  The patient's family history includes Diabetes in his mother; Heart disease in his mother.   ROS:  Please see the history of present illness.   All other systems are personally reviewed and negative.   Exam:  BP 118/62   Pulse (!) 58   Wt 114.8 kg (253 lb)   SpO2 97%   BMI 32.93 kg/m   ReDs Clip 38%.  General:  Well appearing. No respiratory difficulty HEENT: normal Neck: supple. no JVD. Carotids 2+ bilat; no bruits. No lymphadenopathy or thyromegaly appreciated. Cor: PMI nondisplaced. Regular rate & rhythm. No rubs, gallops or murmurs. Lungs: clear Abdomen: soft, nontender, nondistended. No hepatosplenomegaly. No bruits or masses. Good bowel sounds. Extremities: no cyanosis, clubbing, rash, 1+ bilateral ankle edema Neuro: alert & oriented x 3, cranial nerves grossly intact. moves all 4 extremities w/o difficulty. Affect pleasant.   Recent Labs: 12/13/2018: Magnesium 2.3 12/22/2018: ALT 22 04/18/2019: Hemoglobin 11.4; Platelets 187 06/10/2019: B Natriuretic Peptide 347.4; BUN 29; Creatinine, Ser 1.25;  Potassium 4.3; Sodium 141  Personally reviewed   Wt Readings from Last 3 Encounters:  08/13/19 114.8 kg (253 lb)  07/24/19 113.6 kg (250 lb 7.1 oz)  06/10/19 112.4 kg (247 lb 12.8 oz)    ASSESSMENT AND PLAN:   1. CAD: S/p CABG in 11/20. Stable w/o CP. He graduated cardiac rehab 6/21  - Continue ASA 81 daily.  - Continue statin. Good lipids in 2/21.  - Plan repeat lipid panel and HFTs today  2. Chronic systolic CHF: Ischemic cardiomyopathy.  Pre-CABG echo with EF 30-35%, mild RV dysfunction.  Post-CABG echo with EF 30-35%, severe RV systolic dysfunction.  V/Q scan done, no evidence for PE.  I was concerned for peri-op RV infarction. Initially hypotensive post-CABG requiring pressors and then midodrine, but improved over time. Echo repeated 3/21, EF 35-40% with moderate RV dysfunction. Cardiac MRI was recommended to quantify EF more exactly for purposes of ICD determination, however pt is adamant that he does not want an ICD and is declining cMRI.  - NYHA Class II w/ mild volume overload on exam and by ReDs clip, 38% - Increase lasix to 40 mg daily x 2 days + increase KCl to 40 mEq daily x 2 days. - limit sodium  - Continue Coreg 6.25 mg bid.   - Continue Entresto to 97-103 bid - Continue spironolactone 25 mg daily.  - Continue digoxin 0.125, check dig level.  - Continue Farxiga 5 mg daily  - Check BMP today  -We discussed recommendations for the management of chronic heart failure to control volume/symptoms and reduce risk of acute exacerbation that may necessitate hospitalization.  These measures include continuation of current diuretics with close outpatient monitoring of volume status through daily weights.  Patient advised to check weight daily and to call our office if greater than 3 pound weight gain in 24 hours or greater than 5 pound weight gain in the course of 1 week. Patient also strongly encouraged to adhere to a low salt diet, reducing intake to less than 2 g daily. 3. Carotid  stenosis:  - dopplers 11/20 showed 40-59% Rt and 1-39% Lt ICA stenosis  - asymptomatic   - He will need repeat carotid dopplers in 11/21 (study scheduled) - continue ASA and statin. BP well controlled.   F/u w/ Dr. Aundra Dubin in 3 months   Signed, Lyda Jester, PA-C  08/13/2019  [facility]  [address] Heart and [place] [address] [phone] (office) [phone] (fax)

## 2019-08-14 ENCOUNTER — Other Ambulatory Visit: Payer: Self-pay | Admitting: Cardiology

## 2019-08-14 NOTE — Telephone Encounter (Signed)
Rx has been sent to the pharmacy electronically. ° °

## 2019-08-20 MED FILL — ?SPIRONOLACTONE 25 MG TABLE: 25 | 30 days supply | Qty: 30 | Fill #0

## 2019-08-21 ENCOUNTER — Telehealth: Payer: Self-pay

## 2019-08-21 NOTE — Telephone Encounter (Signed)
Pt called with UTI symptoms . Message sent to MD

## 2019-08-22 ENCOUNTER — Other Ambulatory Visit: Payer: Self-pay | Admitting: Cardiology

## 2019-08-22 MED FILL — ATORVASTATIN CALCIUM 40 MG: 40 | 30 days supply | Qty: 30 | Fill #0

## 2019-08-23 ENCOUNTER — Other Ambulatory Visit: Payer: Self-pay | Admitting: Urology

## 2019-08-23 DIAGNOSIS — N3000 Acute cystitis without hematuria: Secondary | ICD-10-CM

## 2019-08-23 MED FILL — ?DIGITEK 125 MCG TABLET: 125 | 30 days supply | Qty: 30 | Fill #0

## 2019-08-23 NOTE — Telephone Encounter (Signed)
OK--I put order in

## 2019-08-23 NOTE — Telephone Encounter (Signed)
Pt reports he is on his way out of town. He will wait until he gets back if he is still having symptoms.   Reports his symptoms are swelling testicle at times with burning. Pt will call back after his vacation if he is still having symptoms.

## 2019-08-23 NOTE — Telephone Encounter (Signed)
Left message to return call 

## 2019-09-10 ENCOUNTER — Encounter: Payer: Self-pay | Admitting: Urology

## 2019-09-10 ENCOUNTER — Ambulatory Visit (INDEPENDENT_AMBULATORY_CARE_PROVIDER_SITE_OTHER): Payer: Self-pay | Admitting: Urology

## 2019-09-10 ENCOUNTER — Other Ambulatory Visit: Payer: Self-pay

## 2019-09-10 VITALS — BP 126/68 | HR 60 | Temp 98.4°F | Ht 73.5 in | Wt 253.0 lb

## 2019-09-10 DIAGNOSIS — Z8549 Personal history of malignant neoplasm of other male genital organs: Secondary | ICD-10-CM

## 2019-09-10 DIAGNOSIS — R3129 Other microscopic hematuria: Secondary | ICD-10-CM

## 2019-09-10 DIAGNOSIS — K4090 Unilateral inguinal hernia, without obstruction or gangrene, not specified as recurrent: Secondary | ICD-10-CM

## 2019-09-10 DIAGNOSIS — N3000 Acute cystitis without hematuria: Secondary | ICD-10-CM

## 2019-09-10 LAB — MICROSCOPIC EXAMINATION
Bacteria, UA: NONE SEEN
Renal Epithel, UA: NONE SEEN /hpf
WBC, UA: NONE SEEN /hpf (ref 0–5)

## 2019-09-10 LAB — URINALYSIS, ROUTINE W REFLEX MICROSCOPIC
Bilirubin, UA: NEGATIVE
Ketones, UA: NEGATIVE
Leukocytes,UA: NEGATIVE
Nitrite, UA: NEGATIVE
Protein,UA: NEGATIVE
Specific Gravity, UA: 1.025 (ref 1.005–1.030)
Urobilinogen, Ur: 0.2 mg/dL (ref 0.2–1.0)
pH, UA: 5.5 (ref 5.0–7.5)

## 2019-09-10 NOTE — Progress Notes (Signed)
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  History of Present Illness:  8.17.2021: Pt experiencing "heating sensation" in his testicle (onset: 5 weeks prior).  This is typically worse when he stands up.  Not bothersome when lying down.  Pt denies any gross hematuria or LUTS.  Pt denies any lumps/bumps, or red spots on penis.  Pt submits that he has recently had blood associated with his bowel movements (blood left on tissue but not present throughout fecal material).  (below copied from [facility] records):  Anh Mangano is a 64 year-old male established patient who is here for trouble with rollling back his foreskin.  He has had difficulties with rolling his foreskin back for 3 years.   8.25.2020: He has intermittent issues with phimosis and irritation of the foreskin over the last few years. He is often unable to retract his foreskin, and this is especially concerning given he has an upcoming hip replacement that will require catheterization. He reports having intermittent inflammation but cannot recall this last episode. This very often is quite painful for him, particularly in the evening.   9.18.2020: Underwent circumcision. Path--SCCa, CIS.   11.3.2020: Here today for follow-up post circumcision. He reports that he is recovering well though there is some tenderness of the remaining skin.   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 He tolerated this procedure well and feels like he has fully recovered.    Past Medical History:  Diagnosis Date  . Cancer ([place])    penile cancer  . Chronic pain of right knee   . Coronary artery disease   . Hypertension   . Phimosis     Past Surgical History:  Procedure Laterality Date  . CIRCUMCISION N/A 10/12/2018   Procedure: CIRCUMCISION ADULT;  Surgeon: Franchot Gallo, MD;  [facility]: [facility];  Service: Urology;  Laterality: N/A;   45 mins  . CO2 LASER APPLICATION N/A 09/30/2950   Procedure: CO2 LASER APPLICATION;  Surgeon: Franchot Gallo, MD;  [facility];  Service: Urology;  Laterality: N/A;  30 MINS  . COLONOSCOPY    . CORONARY ARTERY BYPASS GRAFT N/A 12/12/2018   Procedure: CORONARY ARTERY BYPASS GRAFTING (CABG) x four, using bilateral internal mammary arteries and right leg greater saphenous vein harvested endoscopically;  Surgeon: [name], MD;  Location: [facility];  Service: Open Heart Surgery;  Laterality: N/A;  . LEFT HEART CATH AND CORONARY ANGIOGRAPHY N/A 11/28/2018   Procedure: LEFT HEART CATH AND CORONARY ANGIOGRAPHY;  Surgeon: Burnell Blanks, MD;  Location: [facility];  Service: Cardiovascular;  Laterality: N/A;  . MEDIASTINAL EXPLORATION N/A 12/12/2018   Procedure: Mediastinal Re-Exploration after bypass graft;  Surgeon: [name], MD;  Location: [facility];  Service: Open Heart Surgery;  Laterality: N/A;  . TEE WITHOUT CARDIOVERSION N/A 12/12/2018   Procedure: TRANSESOPHAGEAL ECHOCARDIOGRAM (TEE);  Surgeon: [name], MD;  Location: [facility];  Service: Open Heart Surgery;  Laterality: N/A;  . TOOTH EXTRACTION    . TOTAL HIP ARTHROPLASTY Right 03/08/2019   Procedure: RIGHT TOTAL HIP ARTHROPLASTY ANTERIOR APPROACH;  Surgeon: Mcarthur Rossetti, MD;  [facility];  Service: Orthopedics;  Laterality: Right;    Home Medications:  Allergies as of 09/10/2019   No Known Allergies     Medication List       Accurate as of September 10, 2019  9:00 AM. If you have any questions, ask your nurse  or doctor.        aspirin EC 81 MG tablet Take 81 mg by mouth at bedtime.   atorvastatin 40 MG tablet Commonly known as: LIPITOR TAKE 1 TABLET (40 MG TOTAL) BY MOUTH DAILY AT 6 PM.   carvedilol 6.25 MG tablet Commonly known as: COREG Take 1 tablet (6.25 mg total) by mouth 2 (two) times daily with a meal.   Digitek 0.125 MG tablet Generic drug: digoxin TAKE 1 TABLET  (0.125 MG TOTAL) BY MOUTH DAILY.   Entresto 97-103 MG Generic drug: sacubitril-valsartan Take 1 tablet by mouth 2 (two) times daily.   Farxiga 5 MG Tabs tablet Generic drug: dapagliflozin propanediol Take 5 mg by mouth every evening.   furosemide 20 MG tablet Commonly known as: LASIX Take 1 tablet (20 mg total) by mouth daily.   nitroGLYCERIN 0.4 MG SL tablet Commonly known as: NITROSTAT Place 1 tablet (0.4 mg total) under the tongue every 5 (five) minutes as needed for chest pain. NO more than 3 pills--if you have the need to take 2 pills or more call MD   potassium chloride SA 20 MEQ tablet Commonly known as: KLOR-CON Take 1 tablet (20 mEq total) by mouth daily. Takes while taking furosemide   spironolactone 25 MG tablet Commonly known as: ALDACTONE Take 1 tablet (25 mg total) by mouth daily.       Allergies: No Known Allergies  Family History  Problem Relation Age of Onset  . Diabetes Mother   . Heart disease Mother     Social History:  reports that he has never smoked. He has never used smokeless tobacco. He reports that he does not drink alcohol and does not use drugs.  ROS: A complete review of systems was performed.  All systems are negative except for pertinent findings as noted.  Physical Exam:  Vital signs in last 24 hours: There were no vitals taken for this visit. Constitutional:  Alert and oriented, No acute distress GI: Abdomen is soft, nontender, nondistended, no abdominal masses. No CVAT. Inguinal Hernia (right). Genitourinary: Phallus is circumcised.  There is adhesion present circumferentially between distal penile skin and glans. Testes are descended bilaterally and non-tender and without masses, scrotum is normal in appearance without lesions or masses, perineum is normal on inspection. Atrophic testicles. Hydrocele (right). Prostate 30 grams. Lymphatic: No lymphadenopathy Neurologic: Grossly intact, no focal deficits Psychiatric: Normal mood and  affect  Laboratory Data:  Microscopic hematuria noted  I have reviewed prior pt notes  I have reviewed notes from referring/previous physicians  I have reviewed urinalysis results   Impression/Assessment:  1.  History of penile cancer, no evidence of recurrence today  2.  Microscopic hematuria, new  3.  Right inguinal hernia, minimally symptomatic  4.  Bright red blood per rectum, most likely an anal problem.  Rectal revealed no masses.  Plan:  1. Pt advised to increase fiber and water intake to improve bowel function.  2. F/U in 3 months for ov and urinalysis to monitor microscopic hematuria.  CC: Dr. Raul Del

## 2019-09-10 NOTE — Progress Notes (Signed)
Urological Symptom Review  Patient is experiencing the following symptoms: Possible uti   Review of Systems  Gastrointestinal (upper)  : Negative for upper GI symptoms  Gastrointestinal (lower) : Negative for lower GI symptoms  Constitutional : Negative for symptoms  Skin: Negative for skin symptoms  Eyes: Negative for eye symptoms  Ear/Nose/Throat : Negative for Ear/Nose/Throat symptoms  Hematologic/Lymphatic: Easy bruising  Cardiovascular : Negative for cardiovascular symptoms  Respiratory : Negative for respiratory symptoms  Endocrine: Negative for endocrine symptoms  Musculoskeletal: Negative for musculoskeletal symptoms  Neurological: Negative for neurological symptoms  Psychologic: Negative for psychiatric symptoms

## 2019-09-17 ENCOUNTER — Encounter: Payer: Self-pay | Admitting: Orthopaedic Surgery

## 2019-09-17 ENCOUNTER — Ambulatory Visit (INDEPENDENT_AMBULATORY_CARE_PROVIDER_SITE_OTHER): Payer: Self-pay

## 2019-09-17 ENCOUNTER — Ambulatory Visit (INDEPENDENT_AMBULATORY_CARE_PROVIDER_SITE_OTHER): Payer: Self-pay | Admitting: Orthopaedic Surgery

## 2019-09-17 DIAGNOSIS — Z96641 Presence of right artificial hip joint: Secondary | ICD-10-CM

## 2019-09-17 MED FILL — ?CARVEDILOL 6.25 MG TABLET: 6.25 | 30 days supply | Qty: 60 | Fill #2

## 2019-09-17 NOTE — Progress Notes (Signed)
Office Visit Note   Patient: Frank Love           Date of Birth: 08-17-1955           MRN: [id number] Visit Date: 09/17/2019              Requested by: [name], NP [place],  [place] [address] PCP: [name], NP   Assessment & Plan: Visit Diagnoses:  1. Status post total replacement of right hip     Plan: Plan: He will continue work on range of motion strengthening the hip. Follow-up with [place] at 1 year postop at that time obtain AP pelvis and lateral view of the right hip. He can return sooner if he has any questions concerns. Questions were encouraged and answered.  Follow-Up Instructions: Return in about 6 months (around 03/19/2020) for Radiographs.   Orders:  Orders Placed This Encounter  Procedures  . XR HIP UNILAT W OR W/O PELVIS 2-3 VIEWS RIGHT   No orders of the defined types were placed in this encounter.     Procedures: No procedures performed   Clinical Data: No additional findings.   Subjective: Chief Complaint  Patient presents with  . Right Hip - Follow-up    HPI Mr. Frank Love returns today 6 months status post right total hip arthroplasty. He is overall doing well and has no complaints. He does note some decrease sensation noted. The lateral aspect of the right hip. Does state that he continues to work on going up and down steps. Feels that he has good range of motion. He notes he has no pain in the hip whatsoever. Review of Systems  Denies fevers chills Objective: Vital Signs: There were no vitals taken for this visit.  Physical Exam General: Well-developed well-nourished male in no acute distress.  Psych alert and oriented x3 Ortho Exam Right hip good range of motion without pain. Dorsiflexion plantarflexion right ankle intact. Right calf supple nontender. Ambulates without any assistive device. Specialty Comments:  No specialty comments available.  Imaging: XR HIP UNILAT W OR W/O PELVIS 2-3 VIEWS  RIGHT  Result Date: 09/17/2019 AP pelvis lateral view of right hip: No acute fractures. Hips well located. Well-seated hip arthroplasty.    PMFS History: Patient Active Problem List   Diagnosis Date Noted  . Status post total replacement of right hip 03/08/2019  . Primary osteoarthritis of right hip 02/08/2019  . Stenosis of carotid artery 01/02/2019  . Educated about COVID-19 virus infection 01/02/2019  . Postoperative anemia due to acute blood loss 12/31/2018  . Debility 12/21/2018  . 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 Coronary artery disease 12/12/2018  . Coronary artery disease involving native coronary artery of native heart without angina pectoris   . Ischemic cardiomyopathy   . Acute on chronic systolic heart failure (Frank Love) 10/28/2018  . Preop cardiovascular exam 10/28/2018  . Abnormal EKG 10/28/2018   Past Medical History:  Diagnosis Date  . Cancer (Frank Love)    penile cancer  . Chronic pain of right knee   . Coronary artery disease   . Hypertension   . Phimosis     Family History  Problem Relation Age of Onset  . Diabetes Mother   . Heart disease Mother     Past Surgical History:  Procedure Laterality Date  . CIRCUMCISION N/A 10/12/2018   Procedure: CIRCUMCISION ADULT;  Surgeon: Franchot Gallo, MD;  Location: [facility];  Service: Urology;  Laterality: N/A;  45 mins  .  CO2 LASER APPLICATION N/A 07/02/4852   Procedure: CO2 LASER APPLICATION;  Surgeon: Franchot Gallo, MD;  [facility];  Service: Urology;  Laterality: N/A;  30 MINS  . COLONOSCOPY    . CORONARY ARTERY BYPASS GRAFT N/A 12/12/2018   Procedure: CORONARY ARTERY BYPASS GRAFTING (CABG) x four, using bilateral internal mammary arteries and right leg greater saphenous vein harvested endoscopically;  Surgeon: [name], MD;  Location: [facility];  Service: Open Heart Surgery;  Laterality: N/A;  . LEFT HEART CATH AND CORONARY ANGIOGRAPHY N/A 11/28/2018   Procedure: LEFT HEART CATH AND CORONARY ANGIOGRAPHY;   Surgeon: Burnell Blanks, MD;  Location: [facility];  Service: Cardiovascular;  Laterality: N/A;  . MEDIASTINAL EXPLORATION N/A 12/12/2018   Procedure: Mediastinal Re-Exploration after bypass graft;  Surgeon: [name], MD;  Location: [facility];  Service: Open Heart Surgery;  Laterality: N/A;  . TEE WITHOUT CARDIOVERSION N/A 12/12/2018   Procedure: TRANSESOPHAGEAL ECHOCARDIOGRAM (TEE);  Surgeon: [name], MD;  Location: [facility];  Service: Open Heart Surgery;  Laterality: N/A;  . TOOTH EXTRACTION    . TOTAL HIP ARTHROPLASTY Right 03/08/2019   Procedure: RIGHT TOTAL HIP ARTHROPLASTY ANTERIOR APPROACH;  Surgeon: Mcarthur Rossetti, MD;  [facility];  Service: Orthopedics;  Laterality: Right;   Social History   Occupational History  . Not on file  Tobacco Use  . Smoking status: Never Smoker  . Smokeless tobacco: Never Used  Vaping Use  . Vaping Use: Never used  Substance and Sexual Activity  . Alcohol use: No    Alcohol/week: 0.0 standard drinks  . Drug use: No  . Sexual activity: Yes

## 2019-09-24 MED FILL — ATORVASTATIN CALCIUM 40 MG: 40 | 30 days supply | Qty: 30 | Fill #1

## 2019-09-24 MED FILL — DIGITEK 125 MCG TABLET: 125 | 30 days supply | Qty: 30 | Fill #1

## 2019-09-24 MED FILL — ?SPIRONOLACTONE 25 MG TABLE: 25 | 30 days supply | Qty: 30 | Fill #1

## 2019-10-17 ENCOUNTER — Telehealth (HOSPITAL_COMMUNITY): Payer: Self-pay

## 2019-10-17 NOTE — Telephone Encounter (Signed)
[facility] reschedule 10/12 appt for nxt ava. With dm

## 2019-10-22 MED FILL — ?SPIRONOLACTONE 25 MG TABLE: 25 | 30 days supply | Qty: 30 | Fill #2

## 2019-10-22 MED FILL — FUROSEMIDE 20 MG TABS: 20 | 30 days supply | Qty: 30 | Fill #4

## 2019-10-22 MED FILL — ?ATORVASTATIN 40MG TABLET: 40 | 30 days supply | Qty: 30 | Fill #2

## 2019-10-22 MED FILL — ?CARVEDILOL 6.25 MG TABLET: 6.25 | 30 days supply | Qty: 60 | Fill #3

## 2019-10-22 MED FILL — POTASSIUM CL ER 20 MEQ TAB: 20 | 30 days supply | Qty: 30 | Fill #2

## 2019-10-22 MED FILL — DIGITEK 125 MCG TABLET: 125 | 30 days supply | Qty: 30 | Fill #2

## 2019-11-05 ENCOUNTER — Encounter (HOSPITAL_COMMUNITY): Payer: Self-pay | Admitting: Cardiology

## 2019-11-13 ENCOUNTER — Encounter (HOSPITAL_COMMUNITY): Payer: Self-pay | Admitting: Cardiology

## 2019-11-19 MED FILL — ?CARVEDILOL 6.25 MG TABLET: 6.25 | 30 days supply | Qty: 60 | Fill #4

## 2019-11-19 MED FILL — ?ATORVASTATIN 40MG TABLET: 40 | 30 days supply | Qty: 30 | Fill #3

## 2019-11-26 MED FILL — DIGITEK 125 MCG TABLET: 125 | 30 days supply | Qty: 30 | Fill #3

## 2019-11-26 MED FILL — ?SPIRONOLACTONE 25 MG TABLE: 25 | 30 days supply | Qty: 30 | Fill #3

## 2019-12-10 ENCOUNTER — Ambulatory Visit (HOSPITAL_COMMUNITY)
Admission: RE | Admit: 2019-12-10 | Payer: Self-pay | Source: Ambulatory Visit | Attending: Cardiology | Admitting: Cardiology

## 2019-12-13 ENCOUNTER — Other Ambulatory Visit: Payer: Self-pay

## 2019-12-13 ENCOUNTER — Emergency Department (HOSPITAL_COMMUNITY): Payer: Self-pay

## 2019-12-13 ENCOUNTER — Encounter (HOSPITAL_COMMUNITY): Payer: Self-pay | Admitting: Cardiology

## 2019-12-13 ENCOUNTER — Inpatient Hospital Stay (HOSPITAL_COMMUNITY)
Admission: EM | Admit: 2019-12-13 | Discharge: 2020-01-03 | DRG: 177 | Disposition: A | Discharge status: Home Health | Payer: Self-pay | Attending: Internal Medicine | Admitting: Internal Medicine

## 2019-12-13 DIAGNOSIS — R944 Abnormal results of kidney function studies: Secondary | ICD-10-CM | POA: Diagnosis present

## 2019-12-13 DIAGNOSIS — J9601 Acute respiratory failure with hypoxia: Secondary | ICD-10-CM | POA: Diagnosis present

## 2019-12-13 DIAGNOSIS — D6959 Other secondary thrombocytopenia: Secondary | ICD-10-CM | POA: Diagnosis not present

## 2019-12-13 DIAGNOSIS — T380X5A Adverse effect of glucocorticoids and synthetic analogues, initial encounter: Secondary | ICD-10-CM | POA: Diagnosis not present

## 2019-12-13 DIAGNOSIS — Z6833 Body mass index (BMI) 33.0-33.9, adult: Secondary | ICD-10-CM

## 2019-12-13 DIAGNOSIS — Z8249 Family history of ischemic heart disease and other diseases of the circulatory system: Secondary | ICD-10-CM

## 2019-12-13 DIAGNOSIS — I951 Orthostatic hypotension: Secondary | ICD-10-CM

## 2019-12-13 DIAGNOSIS — I255 Ischemic cardiomyopathy: Secondary | ICD-10-CM | POA: Diagnosis present

## 2019-12-13 DIAGNOSIS — Z96691 Finger-joint replacement of right hand: Secondary | ICD-10-CM | POA: Diagnosis present

## 2019-12-13 DIAGNOSIS — Z833 Family history of diabetes mellitus: Secondary | ICD-10-CM

## 2019-12-13 DIAGNOSIS — I6523 Occlusion and stenosis of bilateral carotid arteries: Secondary | ICD-10-CM | POA: Diagnosis present

## 2019-12-13 DIAGNOSIS — E785 Hyperlipidemia, unspecified: Secondary | ICD-10-CM | POA: Diagnosis present

## 2019-12-13 DIAGNOSIS — I5042 Chronic combined systolic (congestive) and diastolic (congestive) heart failure: Secondary | ICD-10-CM | POA: Diagnosis present

## 2019-12-13 DIAGNOSIS — R739 Hyperglycemia, unspecified: Secondary | ICD-10-CM | POA: Diagnosis not present

## 2019-12-13 DIAGNOSIS — N182 Chronic kidney disease, stage 2 (mild): Secondary | ICD-10-CM | POA: Diagnosis present

## 2019-12-13 DIAGNOSIS — I5022 Chronic systolic (congestive) heart failure: Secondary | ICD-10-CM

## 2019-12-13 DIAGNOSIS — E872 Acidosis: Secondary | ICD-10-CM | POA: Diagnosis present

## 2019-12-13 DIAGNOSIS — G908 Other disorders of autonomic nervous system: Secondary | ICD-10-CM | POA: Diagnosis not present

## 2019-12-13 DIAGNOSIS — R7401 Elevation of levels of liver transaminase levels: Secondary | ICD-10-CM | POA: Diagnosis present

## 2019-12-13 DIAGNOSIS — M25561 Pain in right knee: Secondary | ICD-10-CM | POA: Diagnosis present

## 2019-12-13 DIAGNOSIS — U071 COVID-19: Principal | ICD-10-CM

## 2019-12-13 DIAGNOSIS — Z951 Presence of aortocoronary bypass graft: Secondary | ICD-10-CM

## 2019-12-13 DIAGNOSIS — M79672 Pain in left foot: Secondary | ICD-10-CM | POA: Diagnosis not present

## 2019-12-13 DIAGNOSIS — Z79899 Other long term (current) drug therapy: Secondary | ICD-10-CM

## 2019-12-13 DIAGNOSIS — K5900 Constipation, unspecified: Secondary | ICD-10-CM | POA: Diagnosis not present

## 2019-12-13 DIAGNOSIS — Z96641 Presence of right artificial hip joint: Secondary | ICD-10-CM | POA: Diagnosis present

## 2019-12-13 DIAGNOSIS — Z7982 Long term (current) use of aspirin: Secondary | ICD-10-CM

## 2019-12-13 DIAGNOSIS — R52 Pain, unspecified: Secondary | ICD-10-CM

## 2019-12-13 DIAGNOSIS — G8929 Other chronic pain: Secondary | ICD-10-CM | POA: Diagnosis present

## 2019-12-13 DIAGNOSIS — E875 Hyperkalemia: Secondary | ICD-10-CM | POA: Diagnosis not present

## 2019-12-13 DIAGNOSIS — R7303 Prediabetes: Secondary | ICD-10-CM | POA: Diagnosis present

## 2019-12-13 DIAGNOSIS — I130 Hypertensive heart and chronic kidney disease with heart failure and stage 1 through stage 4 chronic kidney disease, or unspecified chronic kidney disease: Secondary | ICD-10-CM | POA: Diagnosis present

## 2019-12-13 DIAGNOSIS — J1282 Pneumonia due to coronavirus disease 2019: Secondary | ICD-10-CM | POA: Diagnosis present

## 2019-12-13 DIAGNOSIS — R001 Bradycardia, unspecified: Secondary | ICD-10-CM | POA: Diagnosis present

## 2019-12-13 DIAGNOSIS — I2510 Atherosclerotic heart disease of native coronary artery without angina pectoris: Secondary | ICD-10-CM | POA: Diagnosis present

## 2019-12-13 DIAGNOSIS — G4733 Obstructive sleep apnea (adult) (pediatric): Secondary | ICD-10-CM | POA: Diagnosis present

## 2019-12-13 DIAGNOSIS — Y92239 Unspecified place in hospital as the place of occurrence of the external cause: Secondary | ICD-10-CM | POA: Diagnosis not present

## 2019-12-13 DIAGNOSIS — E669 Obesity, unspecified: Secondary | ICD-10-CM | POA: Diagnosis present

## 2019-12-13 DIAGNOSIS — I371 Nonrheumatic pulmonary valve insufficiency: Secondary | ICD-10-CM | POA: Diagnosis present

## 2019-12-13 DIAGNOSIS — Z8549 Personal history of malignant neoplasm of other male genital organs: Secondary | ICD-10-CM

## 2019-12-13 DIAGNOSIS — E440 Moderate protein-calorie malnutrition: Secondary | ICD-10-CM | POA: Diagnosis present

## 2019-12-13 LAB — I-STAT ARTERIAL BLOOD GAS, ED
Acid-base deficit: 6 mmol/L — ABNORMAL HIGH (ref 0.0–2.0)
Bicarbonate: 16.3 mmol/L — ABNORMAL LOW (ref 20.0–28.0)
Calcium, Ion: 1.05 mmol/L — ABNORMAL LOW (ref 1.15–1.40)
HCT: 31 % — ABNORMAL LOW (ref 39.0–52.0)
Hemoglobin: 10.5 g/dL — ABNORMAL LOW (ref 13.0–17.0)
O2 Saturation: 99 %
Patient temperature: 98.6
Potassium: 3.8 mmol/L (ref 3.5–5.1)
Sodium: 140 mmol/L (ref 135–145)
TCO2: 17 mmol/L — ABNORMAL LOW (ref 22–32)
pCO2 arterial: 22.4 mmHg — ABNORMAL LOW (ref 32.0–48.0)
pH, Arterial: 7.47 — ABNORMAL HIGH (ref 7.350–7.450)
pO2, Arterial: 118 mmHg — ABNORMAL HIGH (ref 83.0–108.0)

## 2019-12-13 LAB — CBC
HCT: 37.5 % — ABNORMAL LOW (ref 39.0–52.0)
Hemoglobin: 12.1 g/dL — ABNORMAL LOW (ref 13.0–17.0)
MCH: 28.3 pg (ref 26.0–34.0)
MCHC: 32.3 g/dL (ref 30.0–36.0)
MCV: 87.8 fL (ref 80.0–100.0)
Platelets: 244 10*3/uL (ref 150–400)
RBC: 4.27 MIL/uL (ref 4.22–5.81)
RDW: 14.7 % (ref 11.5–15.5)
WBC: 4.9 10*3/uL (ref 4.0–10.5)
nRBC: 0 % (ref 0.0–0.2)

## 2019-12-13 LAB — LIPASE, BLOOD: Lipase: 54 U/L — ABNORMAL HIGH (ref 11–51)

## 2019-12-13 LAB — COMPREHENSIVE METABOLIC PANEL
ALT: 92 U/L — ABNORMAL HIGH (ref 0–44)
AST: 86 U/L — ABNORMAL HIGH (ref 15–41)
Albumin: 2.6 g/dL — ABNORMAL LOW (ref 3.5–5.0)
Alkaline Phosphatase: 73 U/L (ref 38–126)
Anion gap: 10 (ref 5–15)
BUN: 50 mg/dL — ABNORMAL HIGH (ref 8–23)
CO2: 18 mmol/L — ABNORMAL LOW (ref 22–32)
Calcium: 7.5 mg/dL — ABNORMAL LOW (ref 8.9–10.3)
Chloride: 111 mmol/L (ref 98–111)
Creatinine, Ser: 1.32 mg/dL — ABNORMAL HIGH (ref 0.61–1.24)
GFR, Estimated: >60 mL/min (ref >60)
Glucose, Bld: 124 mg/dL — ABNORMAL HIGH (ref 70–99)
Potassium: 4.2 mmol/L (ref 3.5–5.1)
Sodium: 139 mmol/L (ref 135–145)
Total Bilirubin: 1.4 mg/dL — ABNORMAL HIGH (ref 0.3–1.2)
Total Protein: 6.6 g/dL (ref 6.5–8.1)

## 2019-12-13 LAB — RESPIRATORY PANEL BY RT PCR (FLU A&B, COVID)
Influenza A by PCR: NEGATIVE
Influenza B by PCR: NEGATIVE
SARS Coronavirus 2 by RT PCR: POSITIVE — AB

## 2019-12-13 LAB — LACTIC ACID, PLASMA: Lactic Acid, Venous: 1.4 mmol/L (ref 0.5–1.9)

## 2019-12-13 LAB — TROPONIN I (HIGH SENSITIVITY): Troponin I (High Sensitivity): 64 ng/L — ABNORMAL HIGH (ref <18)

## 2019-12-13 LAB — BRAIN NATRIURETIC PEPTIDE: B Natriuretic Peptide: 443.4 pg/mL — ABNORMAL HIGH (ref 0.0–100.0)

## 2019-12-13 MED ORDER — INSULIN ASPART 100 UNIT/ML ~~LOC~~ SOLN
0.0000 [IU] | Freq: Three times a day (TID) | SUBCUTANEOUS | Status: DC
  Administered 2019-12-14: 2 [IU] via SUBCUTANEOUS
  Administered 2019-12-15: 3 [IU] via SUBCUTANEOUS
  Administered 2019-12-15 (×2): 2 [IU] via SUBCUTANEOUS
  Administered 2019-12-16: 3 [IU] via SUBCUTANEOUS
  Administered 2019-12-16: 2 [IU] via SUBCUTANEOUS
  Administered 2019-12-16 – 2019-12-17 (×4): 3 [IU] via SUBCUTANEOUS
  Administered 2019-12-18: 2 [IU] via SUBCUTANEOUS
  Administered 2019-12-18 (×2): 3 [IU] via SUBCUTANEOUS
  Administered 2019-12-19: 2 [IU] via SUBCUTANEOUS
  Administered 2019-12-19: 3 [IU] via SUBCUTANEOUS
  Administered 2019-12-19: 2 [IU] via SUBCUTANEOUS
  Administered 2019-12-20: 3 [IU] via SUBCUTANEOUS
  Administered 2019-12-20: 1 [IU] via SUBCUTANEOUS
  Administered 2019-12-20 – 2019-12-21 (×2): 2 [IU] via SUBCUTANEOUS
  Administered 2019-12-21: 1 [IU] via SUBCUTANEOUS
  Administered 2019-12-21: 2 [IU] via SUBCUTANEOUS
  Administered 2019-12-22 (×2): 1 [IU] via SUBCUTANEOUS
  Administered 2019-12-22: 2 [IU] via SUBCUTANEOUS
  Administered 2019-12-23 – 2019-12-24 (×2): 1 [IU] via SUBCUTANEOUS
  Administered 2019-12-24 – 2019-12-25 (×2): 2 [IU] via SUBCUTANEOUS
  Administered 2019-12-26 – 2019-12-28 (×2): 1 [IU] via SUBCUTANEOUS

## 2019-12-13 MED ORDER — ADULT MULTIVITAMIN W/MINERALS CH
1.0000 | ORAL_TABLET | Freq: Every day | ORAL | Status: DC
  Administered 2019-12-14 – 2020-01-03 (×21): 1 via ORAL
  Filled 2019-12-13 (×21): qty 1

## 2019-12-13 MED ORDER — SODIUM CHLORIDE 0.9 % IV SOLN: 200.0000 mg | Freq: Once | INTRAVENOUS | Status: DC

## 2019-12-13 MED ORDER — THIAMINE HCL 100 MG PO TABS
100.0000 mg | ORAL_TABLET | Freq: Every day | ORAL | Status: DC
  Administered 2019-12-14 – 2020-01-03 (×21): 100 mg via ORAL
  Filled 2019-12-13 (×21): qty 1

## 2019-12-13 MED ORDER — METHYLPREDNISOLONE SODIUM SUCC 125 MG IJ SOLR
125.0000 mg | Freq: Two times a day (BID) | INTRAMUSCULAR | Status: DC
  Administered 2019-12-14: 125 mg via INTRAVENOUS
  Filled 2019-12-13: qty 2

## 2019-12-13 MED ORDER — LACTATED RINGERS IV BOLUS
500.0000 mL | Freq: Once | INTRAVENOUS | Status: AC
  Administered 2019-12-13: 500 mL via INTRAVENOUS

## 2019-12-13 MED ORDER — PREDNISONE 5 MG PO TABS: 50.0000 mg | ORAL_TABLET | Freq: Every day | ORAL | Status: DC

## 2019-12-13 MED ORDER — GUAIFENESIN-DM 100-10 MG/5ML PO SYRP
10.0000 mL | ORAL_SOLUTION | ORAL | Status: DC | PRN
  Administered 2019-12-14 – 2019-12-15 (×2): 10 mL via ORAL
  Filled 2019-12-13 (×2): qty 10

## 2019-12-13 MED ORDER — VANCOMYCIN HCL 2000 MG/400ML IV SOLN
2000.0000 mg | Freq: Once | INTRAVENOUS | Status: AC
  Administered 2019-12-14: 2000 mg via INTRAVENOUS
  Filled 2019-12-13: qty 400

## 2019-12-13 MED ORDER — ASCORBIC ACID 500 MG PO TABS
500.0000 mg | ORAL_TABLET | Freq: Every day | ORAL | Status: DC
  Administered 2019-12-14 – 2020-01-03 (×21): 500 mg via ORAL
  Filled 2019-12-13 (×21): qty 1

## 2019-12-13 MED ORDER — SODIUM CHLORIDE 0.9 % IV SOLN
2.0000 g | Freq: Once | INTRAVENOUS | Status: AC
  Administered 2019-12-13: 2 g via INTRAVENOUS
  Filled 2019-12-13: qty 2

## 2019-12-13 MED ORDER — BARICITINIB 2 MG PO TABS
4.0000 mg | ORAL_TABLET | Freq: Every day | ORAL | Status: DC
  Filled 2019-12-13: qty 2

## 2019-12-13 MED ORDER — IPRATROPIUM-ALBUTEROL 20-100 MCG/ACT IN AERS
1.0000 | INHALATION_SPRAY | Freq: Four times a day (QID) | RESPIRATORY_TRACT | Status: DC
  Administered 2019-12-14 – 2019-12-16 (×7): 1 via RESPIRATORY_TRACT
  Filled 2019-12-13 (×3): qty 4

## 2019-12-13 MED ORDER — FOLIC ACID 1 MG PO TABS
1.0000 mg | ORAL_TABLET | Freq: Every day | ORAL | Status: DC
  Administered 2019-12-14 – 2020-01-03 (×21): 1 mg via ORAL
  Filled 2019-12-13 (×21): qty 1

## 2019-12-13 MED ORDER — SODIUM CHLORIDE 0.9 % IV SOLN: 100.0000 mg | Freq: Every day | INTRAVENOUS | Status: DC

## 2019-12-13 MED ORDER — ENOXAPARIN SODIUM 40 MG/0.4ML ~~LOC~~ SOLN
40.0000 mg | Freq: Every day | SUBCUTANEOUS | Status: DC
  Administered 2019-12-14: 40 mg via SUBCUTANEOUS
  Filled 2019-12-13 (×2): qty 0.4

## 2019-12-13 MED ORDER — ZINC SULFATE 220 (50 ZN) MG PO CAPS
220.0000 mg | ORAL_CAPSULE | Freq: Every day | ORAL | Status: DC
  Administered 2019-12-14 – 2019-12-29 (×16): 220 mg via ORAL
  Filled 2019-12-13 (×16): qty 1

## 2019-12-13 NOTE — ED Triage Notes (Signed)
Pt bib [facility] for eval of SOB/generalized weakness. Per pt, pt has had confirmed contact with covid+ person in the last week.   VSS, hx bradycardia 20g L hand

## 2019-12-13 NOTE — ED Notes (Signed)
RN made aware of low BP and O2.

## 2019-12-13 NOTE — ED Provider Notes (Signed)
[facility] EMERGENCY DEPARTMENT Provider Note   CSN: [id number] Arrival date & time: 12/13/19  2137     History Chief Complaint  Patient presents with  . Covid Exposure  . Shortness of Breath  . generalized weakness    Frank Love is a 64 y.o. male with history of CAD, hypertension presents with fatigue.  Patient states that he called EMS after his wife syncopized and they took his vital signs and recommended that he also be brought in by EMS.  Different EMS crew that evaluated the patient stated that he will hypoxic to the 70s and he is placed on nonrebreather.  Patient denies any shortness of breath, fever, chills, chest pain, abdominal pain, URI symptoms, urinary symptoms, or headache.  He states that his main complaint right now is fatigue he states he has had this for several weeks but seems to be worsening.  No clear aggravating or alleviating factors.  No previous similar symptoms.  Has not tried anything at home for it.  He states that he finds it difficult to sleep is not sure why.  Sick contacts include a daughter with Covid.  Patient is not vaccinated for COVID.  The history is provided by the patient.       Past Medical History:  Diagnosis Date  . Cancer ([place])    penile cancer  . Chronic pain of right knee   . Coronary artery disease   . Hypertension   . Phimosis     Patient Active Problem List   Diagnosis Date Noted  . Status post total replacement of right hip 03/08/2019  . Primary osteoarthritis of right hip 02/08/2019  . Stenosis of carotid artery 01/02/2019  . Educated about COVID-19 virus infection 01/02/2019  . Postoperative anemia due to acute blood loss 12/31/2018  . Debility 12/21/2018  . 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 Coronary artery disease 12/12/2018  . Coronary artery disease involving native coronary artery of native heart without angina pectoris   . Ischemic cardiomyopathy   . Acute on chronic systolic heart failure ([place])  10/28/2018  . Preop cardiovascular exam 10/28/2018  . Abnormal EKG 10/28/2018    Past Surgical History:  Procedure Laterality Date  . CIRCUMCISION N/A 10/12/2018   Procedure: CIRCUMCISION ADULT;  Surgeon: Franchot Gallo, MD;  Location: [facility];  Service: Urology;  Laterality: N/A;  45 mins  . CO2 LASER APPLICATION N/A 0/08/1446   Procedure: CO2 LASER APPLICATION;  Surgeon: Franchot Gallo, MD;  Location: [facility];  Service: Urology;  Laterality: N/A;  30 MINS  . COLONOSCOPY    . CORONARY ARTERY BYPASS GRAFT N/A 12/12/2018   Procedure: CORONARY ARTERY BYPASS GRAFTING (CABG) x four, using bilateral internal mammary arteries and right leg greater saphenous vein harvested endoscopically;  Surgeon: [name], MD;  Location: [place];  Service: Open Heart Surgery;  Laterality: N/A;  . LEFT HEART CATH AND CORONARY ANGIOGRAPHY N/A 11/28/2018   Procedure: LEFT HEART CATH AND CORONARY ANGIOGRAPHY;  Surgeon: Burnell Blanks, MD;  Location: [place] [facility];  Service: Cardiovascular;  Laterality: N/A;  . MEDIASTINAL EXPLORATION N/A 12/12/2018   Procedure: Mediastinal Re-Exploration after bypass graft;  Surgeon: [name], MD;  Location: [place];  Service: Open Heart Surgery;  Laterality: N/A;  . TEE WITHOUT CARDIOVERSION N/A 12/12/2018   Procedure: TRANSESOPHAGEAL ECHOCARDIOGRAM (TEE);  Surgeon: [name], MD;  Location: [place];  Service: Open Heart Surgery;  Laterality: N/A;  . TOOTH EXTRACTION    .  TOTAL HIP ARTHROPLASTY Right 03/08/2019   Procedure: RIGHT TOTAL HIP ARTHROPLASTY ANTERIOR APPROACH;  Surgeon: Mcarthur Rossetti, MD;  Location: [facility];  Service: Orthopedics;  Laterality: Right;       Family History  Problem Relation Age of Onset  . Diabetes Mother   . Heart disease Mother     Social History   Tobacco Use  . Smoking status: Never Smoker  . Smokeless tobacco: Never Used  Vaping Use  . Vaping Use: Never used  Substance Use Topics  . Alcohol  use: No    Alcohol/week: 0.0 standard drinks  . Drug use: No    Home Medications Prior to Admission medications   Medication Sig Start Date End Date Taking? Authorizing Provider  aspirin EC 81 MG tablet Take 81 mg by mouth at bedtime.     [provider]  atorvastatin (LIPITOR) 40 MG tablet TAKE 1 TABLET (40 MG TOTAL) BY MOUTH DAILY AT 6 PM. 08/22/19   [name], MD  carvedilol (COREG) 6.25 MG tablet Take 1 tablet (6.25 mg total) by mouth 2 (two) times daily with a meal. 03/18/19   Larey Dresser, MD  dapagliflozin propanediol (FARXIGA) 5 MG TABS tablet Take 5 mg by mouth every evening. 06/10/19   Bensimhon, Shaune Pascal, MD  DIGITEK 125 MCG tablet TAKE 1 TABLET (0.125 MG TOTAL) BY MOUTH DAILY. 08/14/19   [name], MD  furosemide (LASIX) 20 MG tablet Take 1 tablet (20 mg total) by mouth daily. 04/18/19 08/23/19  Larey Dresser, MD  nitroGLYCERIN (NITROSTAT) 0.4 MG SL tablet Place 1 tablet (0.4 mg total) under the tongue every 5 (five) minutes as needed for chest pain. NO more than 3 pills--if you have the need to take 2 pills or more call MD 12/28/18   Love, Ivan Anchors, PA-C  potassium chloride SA (KLOR-CON) 20 MEQ tablet Take 1 tablet (20 mEq total) by mouth daily. Takes while taking furosemide 05/13/19   Larey Dresser, MD  sacubitril-valsartan (ENTRESTO) 97-103 MG Take 1 tablet by mouth 2 (two) times daily. 06/10/19   Bensimhon, Shaune Pascal, MD  spironolactone (ALDACTONE) 25 MG tablet Take 1 tablet (25 mg total) by mouth daily. 04/23/19   Larey Dresser, MD    Allergies    Patient has no known allergies.  Review of Systems   Review of Systems  Constitutional: Positive for fatigue. Negative for chills and fever.  HENT: Negative for congestion, ear pain and sore throat.   Eyes: Negative for pain and visual disturbance.  Respiratory: Negative for cough and shortness of breath.   Cardiovascular: Positive for leg swelling (chronic, L>R). Negative for chest pain and palpitations.   Gastrointestinal: Negative for abdominal pain and vomiting.  Genitourinary: Negative for dysuria and hematuria.  Musculoskeletal: Negative for arthralgias and back pain.  Skin: Negative for color change and rash.  Neurological: Negative for seizures, syncope, weakness, numbness and headaches.  All other systems reviewed and are negative.   Physical Exam Updated Vital Signs There were no vitals taken for this visit.  Physical Exam Vitals and nursing note reviewed.  Constitutional:      General: He is in acute distress (hypoxic, tachypneic).     Appearance: He is well-developed. He is ill-appearing. He is not toxic-appearing or diaphoretic.  HENT:     Head: Normocephalic and atraumatic.  Eyes:     Conjunctiva/sclera: Conjunctivae normal.  Cardiovascular:     Rate and Rhythm: Regular rhythm. Bradycardia present.     Pulses: Normal  pulses.     Heart sounds: No murmur heard.   Pulmonary:     Effort: Tachypnea and respiratory distress (satting 84% on 15L NRB while talking) present.     Breath sounds: Decreased breath sounds present. No wheezing or rhonchi.  Abdominal:     Palpations: Abdomen is soft.     Tenderness: There is no abdominal tenderness.  Musculoskeletal:     Cervical back: Neck supple.     Right lower leg: Edema present.     Left lower leg: Edema present.  Skin:    General: Skin is warm and dry.  Neurological:     Mental Status: He is alert and oriented to person, place, and time.     Motor: No weakness.     ED Results / Procedures / Treatments   Labs (all labs ordered are listed, but only abnormal results are displayed) Labs Reviewed - No data to display  EKG None  Radiology No results found.  Procedures Procedures (including critical care time)  Medications Ordered in ED Medications - No data to display  ED Course  I have reviewed the triage vital signs and the nursing notes.  Pertinent labs & imaging results that were available during my  care of the patient were reviewed by me and considered in my medical decision making (see chart for details).    MDM Rules/Calculators/A&P                          MDM: Frank Love is a 64 y.o. male who presents with fatigue as per above. I have reviewed the nursing documentation for past medical history, family history, and social history. Pertinent previous records reviewed. He is awake, alert.  Bradycardic, MAP 65. Afebrile. Physical exam is most notable for tachypnea, patient initially satting 91% on 15 L nonrebreather but then desatted to the mid 3s while talking during interview.  Patiently is visibly dyspneic but denies any shortness of breath.    Labs: Bicarb 18, creatinine at baseline, mild transaminitis, troponin 64, BUN 443, CBC unremarkable other than hemoglobin of 12. COVID positive. EKG: EKG with sinus bradycardia, T wave inversions in precordial leads and appears slightly worse than prior (although did have T wave inversions on previous EKG in inferior and precordial leads).  Ventricular rate 55, QTc 49, QRS 221, nonspecific block. No STEMI. No evidence of a High-Grade Conduction Block, WPW, Brugada Sign, ARVC, DeWinters T Waves, or Wellens Waves. Imaging: CXR demonstrating left lower lobe pneumonia versus multifocal pneumonia c/w COVID Consults: none Tx: 500 cc LR, cefepime, vancomycin  Differential Dx: I am most concerned for COVID. Given history, physical exam, and work-up, I do not think he has PE, ACS, pneumothorax, bacteremia, meningitis, stroke, or trauma.  MDM: Frank Love is a 65 y.o. male presents with fatigue found to be hypoxic on 15 L nonrebreather..  Patient bradycardic although is on him coreg which may be contributing.  After interview, patient continued to sat around 86% on 15 L nonrebreather so respiratory therapy was contacted for assistance with high flow nasal cannula. Patient reassessed and satting 92% on 15L HFNC with overlying NRB.  Due to  patient's hypoxia and left lower lobe pneumonia, patient given broad-spectrum antibiotics for possible superimposed pneumonia causing significant hypoxia.  ABG with PO2 118.  Due to history of CHF with an EF of 35 to 40%, patient given gentle fluids with 500 cc LR bolus due to softer pressures.  Patient maintained a  map of 65-70 while in the ED under my care.  Hospitalist consulted for admission to stepdown unit due to patient's oxygen requirement.  Handoff given to hospitalist, admitted.  The plan for this patient was discussed with Dr. Vanita Panda, who voiced agreement and who oversaw evaluation and treatment of this patient.   Final Clinical Impression(s) / ED Diagnoses Final diagnoses:  None    Rx / DC Orders ED Discharge Orders    None       Timmia Cogburn, MD 12/14/19 Berniece Salines    Carmin Muskrat, MD 12/17/19 [phone]

## 2019-12-13 NOTE — ED Notes (Signed)
Daughter wants to be called [phone]

## 2019-12-14 DIAGNOSIS — U071 COVID-19: Principal | ICD-10-CM

## 2019-12-14 DIAGNOSIS — J9601 Acute respiratory failure with hypoxia: Secondary | ICD-10-CM

## 2019-12-14 LAB — FERRITIN: Ferritin: 630 ng/mL — ABNORMAL HIGH (ref 24–336)

## 2019-12-14 LAB — TSH: TSH: 0.056 u[IU]/mL — ABNORMAL LOW (ref 0.350–4.500)

## 2019-12-14 LAB — TROPONIN I (HIGH SENSITIVITY)
Troponin I (High Sensitivity): 58 ng/L — ABNORMAL HIGH (ref <18)
Troponin I (High Sensitivity): 47 ng/L — ABNORMAL HIGH (ref <18)

## 2019-12-14 LAB — COMPREHENSIVE METABOLIC PANEL
ALT: 83 U/L — ABNORMAL HIGH (ref 0–44)
AST: 75 U/L — ABNORMAL HIGH (ref 15–41)
Albumin: 2.4 g/dL — ABNORMAL LOW (ref 3.5–5.0)
Alkaline Phosphatase: 70 U/L (ref 38–126)
Anion gap: 13 (ref 5–15)
BUN: 48 mg/dL — ABNORMAL HIGH (ref 8–23)
CO2: 15 mmol/L — ABNORMAL LOW (ref 22–32)
Calcium: 7.3 mg/dL — ABNORMAL LOW (ref 8.9–10.3)
Chloride: 112 mmol/L — ABNORMAL HIGH (ref 98–111)
Creatinine, Ser: 1.23 mg/dL (ref 0.61–1.24)
GFR, Estimated: >60 mL/min (ref >60)
Glucose, Bld: 125 mg/dL — ABNORMAL HIGH (ref 70–99)
Potassium: 4.1 mmol/L (ref 3.5–5.1)
Sodium: 140 mmol/L (ref 135–145)
Total Bilirubin: 1.5 mg/dL — ABNORMAL HIGH (ref 0.3–1.2)
Total Protein: 6.3 g/dL — ABNORMAL LOW (ref 6.5–8.1)

## 2019-12-14 LAB — CBC WITH DIFFERENTIAL/PLATELET
Abs Immature Granulocytes: 0.13 10*3/uL — ABNORMAL HIGH (ref 0.00–0.07)
Basophils Absolute: 0 10*3/uL (ref 0.0–0.1)
Basophils Relative: 0 %
Eosinophils Absolute: 0.1 10*3/uL (ref 0.0–0.5)
Eosinophils Relative: 2 %
HCT: 38.1 % — ABNORMAL LOW (ref 39.0–52.0)
Hemoglobin: 12 g/dL — ABNORMAL LOW (ref 13.0–17.0)
Immature Granulocytes: 3 %
Lymphocytes Relative: 9 %
Lymphs Abs: 0.4 10*3/uL — ABNORMAL LOW (ref 0.7–4.0)
MCH: 27.8 pg (ref 26.0–34.0)
MCHC: 31.5 g/dL (ref 30.0–36.0)
MCV: 88.4 fL (ref 80.0–100.0)
Monocytes Absolute: 0.2 10*3/uL (ref 0.1–1.0)
Monocytes Relative: 5 %
Neutro Abs: 4 10*3/uL (ref 1.7–7.7)
Neutrophils Relative %: 81 %
Platelets: 217 10*3/uL (ref 150–400)
RBC: 4.31 MIL/uL (ref 4.22–5.81)
RDW: 14.7 % (ref 11.5–15.5)
WBC: 4.9 10*3/uL (ref 4.0–10.5)
nRBC: 0 % (ref 0.0–0.2)

## 2019-12-14 LAB — GLUCOSE, CAPILLARY
Glucose-Capillary: 120 mg/dL — ABNORMAL HIGH (ref 70–99)
Glucose-Capillary: 113 mg/dL — ABNORMAL HIGH (ref 70–99)
Glucose-Capillary: 152 mg/dL — ABNORMAL HIGH (ref 70–99)
Glucose-Capillary: 160 mg/dL — ABNORMAL HIGH (ref 70–99)

## 2019-12-14 LAB — MAGNESIUM: Magnesium: 3.6 mg/dL — ABNORMAL HIGH (ref 1.7–2.4)

## 2019-12-14 LAB — HEMOGLOBIN A1C
Hgb A1c MFr Bld: 6.1 % — ABNORMAL HIGH (ref 4.8–5.6)
Mean Plasma Glucose: 128.37 mg/dL

## 2019-12-14 LAB — C-REACTIVE PROTEIN: CRP: 6.6 mg/dL — ABNORMAL HIGH (ref <1.0)

## 2019-12-14 LAB — D-DIMER, QUANTITATIVE: D-Dimer, Quant: 3.17 ug/mL-FEU — ABNORMAL HIGH (ref 0.00–0.50)

## 2019-12-14 LAB — PROCALCITONIN: Procalcitonin: <0.1 ng/mL

## 2019-12-14 LAB — PHOSPHORUS: Phosphorus: 4.1 mg/dL (ref 2.5–4.6)

## 2019-12-14 MED ORDER — METHYLPREDNISOLONE SODIUM SUCC 125 MG IJ SOLR
60.0000 mg | Freq: Three times a day (TID) | INTRAMUSCULAR | Status: DC
  Administered 2019-12-14 – 2019-12-19 (×15): 60 mg via INTRAVENOUS
  Filled 2019-12-14 (×14): qty 2

## 2019-12-14 MED ORDER — TOCILIZUMAB 400 MG/20ML IV SOLN
800.0000 mg | Freq: Once | INTRAVENOUS | Status: AC
  Administered 2019-12-14: 800 mg via INTRAVENOUS
  Filled 2019-12-14: qty 40

## 2019-12-14 MED ORDER — GLUCERNA SHAKE PO LIQD
237.0000 mL | Freq: Three times a day (TID) | ORAL | Status: DC
  Administered 2019-12-14 – 2019-12-25 (×10): 237 mL via ORAL

## 2019-12-14 MED ORDER — ASPIRIN EC 81 MG PO TBEC
81.0000 mg | DELAYED_RELEASE_TABLET | Freq: Every day | ORAL | Status: DC
  Administered 2019-12-14 – 2020-01-02 (×20): 81 mg via ORAL
  Filled 2019-12-14 (×22): qty 1

## 2019-12-14 MED ORDER — SODIUM CHLORIDE 0.9 % IV SOLN
100.0000 mg | Freq: Every day | INTRAVENOUS | Status: AC
  Administered 2019-12-15 – 2019-12-18 (×4): 100 mg via INTRAVENOUS
  Filled 2019-12-14 (×4): qty 20

## 2019-12-14 MED ORDER — ENOXAPARIN SODIUM 60 MG/0.6ML ~~LOC~~ SOLN
60.0000 mg | Freq: Two times a day (BID) | SUBCUTANEOUS | Status: DC
  Administered 2019-12-14 – 2019-12-18 (×8): 60 mg via SUBCUTANEOUS
  Filled 2019-12-14 (×8): qty 0.6

## 2019-12-14 MED ORDER — PANTOPRAZOLE SODIUM 40 MG PO TBEC
40.0000 mg | DELAYED_RELEASE_TABLET | Freq: Every day | ORAL | Status: DC
  Administered 2019-12-14 – 2019-12-29 (×16): 40 mg via ORAL
  Filled 2019-12-14 (×15): qty 1

## 2019-12-14 MED ORDER — SODIUM CHLORIDE 0.9 % IV SOLN
100.0000 mg | INTRAVENOUS | Status: AC
  Administered 2019-12-14 (×2): 100 mg via INTRAVENOUS
  Filled 2019-12-14 (×2): qty 20

## 2019-12-14 MED ORDER — ATORVASTATIN CALCIUM 40 MG PO TABS
40.0000 mg | ORAL_TABLET | Freq: Every day | ORAL | Status: DC
  Administered 2019-12-14 – 2020-01-02 (×20): 40 mg via ORAL
  Filled 2019-12-14 (×21): qty 1

## 2019-12-14 MED ORDER — CARVEDILOL 3.125 MG PO TABS
3.1250 mg | ORAL_TABLET | Freq: Two times a day (BID) | ORAL | Status: DC
  Administered 2019-12-14 – 2019-12-15 (×3): 3.125 mg via ORAL
  Filled 2019-12-14 (×4): qty 1

## 2019-12-14 MED ORDER — VITAMIN D 25 MCG (1000 UNIT) PO TABS
1000.0000 [IU] | ORAL_TABLET | Freq: Every day | ORAL | Status: DC
  Administered 2019-12-14 – 2020-01-03 (×21): 1000 [IU] via ORAL
  Filled 2019-12-14 (×21): qty 1

## 2019-12-14 NOTE — Progress Notes (Signed)
PROGRESS NOTE                                                                             PROGRESS NOTE                                                                                                                                                                                                             Patient Demographics:    Frank Love, is a 64 y.o. male, DOB - 03/13/1955, ZOX:[id number]  Outpatient Primary MD for the patient is [name], NP    LOS - 1  Admit date - 12/13/2019    Chief Complaint  Patient presents with  . Covid Exposure  . Shortness of Breath  . generalized weakness       Brief Narrative    This is a no charge note this patient was seen and admitted earlier today by Dr. Liberty Handy, chart, imaging and labs were reviewed  HPI: Frank Love is a 64 y.o. male with medical history significant for chronic systolic CHF, ischemic cardiomyopathy with LVEF 35 to 40%, coronary artery disease status post CABG, penile cancer, carotid artery stenosis, who presented to [facility] via EMS after being found severely hypoxic at home.  Endorses worsening fatigue for a few weeks.  Initially called EMS for his wife after she syncopized at home.  While EMS was there, they evaluating him too due to his weak appearance.  He was found to be severely hypoxic with O2 saturation in the 70s on room air, was placed on nonrebreather.  Daughter tested positive for Covid-19.  Patient is not vaccinated for COVID-19.  Admits to nonproductive cough.  Denies pleuritic pain, abdominal pain or diarrhea.  In the ED, he required nonrebreather and high flow nasal cannula to maintain O2 saturation greater than 92%.  Work-up in the ED revealed severe acute hypoxic respiratory failure secondary to COVID-19 viral pneumonia.  TRH, hospitalist team, asked to admit.  ED Course: Initially hypotensive with a MAP of 63 which improved to 67.  Bradycardic with heart rate  in the 50s.  Lab studies remarkable for elevated BNP 443, troponin S 64, creatinine 1.32, hemoglobin 10.5.  Chest x-ray consistent with multifocal viral pneumonia or atypical pneumonia.    Subjective:    Frank Love today any chest pain, he reports significant dyspnea, and cough.   Assessment  & Plan :    Active Problems:   Acute hypoxemic respiratory failure due to COVID-19 [facility])  Acute Hypoxic Resp. Failure due to Acute Covid 19 Viral Pneumonitis during the ongoing 2020 Covid 19 Pandemic  -Unfortunately patient is unvaccinated -Appears to be with severe COVID-19 pneumonia and lung injury -Continue with IV steroids, has been changed to IV Solu-Medrol -Received IV remdesivir -Received Actemra 11/13/2019 -Continue to trend inflammatory markers, D-dimers at 3.17, will check venous Dopplers change his Lovenox to 0.5 mg/kg every 12 hours. Encouraged the patient to sit up in chair in the daytime use I-S and flutter valve for pulmonary toiletry and then prone in bed when at night.  Will advance activity and titrate down oxygen as possible.  Actemra/Baricitinib  off label use - patient was told that if COVID-19 pneumonitis gets worse we might potentially use Actemra off label, patient denies any known history of active diverticulitis, tuberculosis or hepatitis, understands the risks and benefits and wants to proceed with Actemra treatment if required.     SpO2: 97 % O2 Flow Rate (L/min): 35 L/min FiO2 (%): 100 %  Recent Labs  Lab 12/13/19 2147 12/14/19 0543  WBC 4.9 4.9  PLT 244 217  CRP  --  6.6*  BNP 443.4*  --   DDIMER  --  3.17*  PROCALCITON  --  <0.10  AST 86* 75*  ALT 92* 83*  ALKPHOS 73 70  BILITOT 1.4* 1.5*  ALBUMIN 2.6* 2.4*  LATICACIDVEN 1.4  --   SARSCOV2NAA POSITIVE*  --     FiO2 (%):  [100 %] 100 %  ABG     Component Value Date/Time   PHART 7.470 (H) 12/13/2019 2335   PCO2ART 22.4 (L) 12/13/2019 2335   PO2ART 118 (H) 12/13/2019 2335   HCO3 16.3 (L)  12/13/2019 2335   TCO2 17 (L) 12/13/2019 2335   ACIDBASEDEF 6.0 (H) 12/13/2019 2335   O2SAT 99.0 12/13/2019 2335    Chronic systolic CHF/CAD status post CABG Presented with elevated BNP 447 Euvolemic on exam Last 2D echo was on 04/18/2019 showing LVEF 35 to 40% -With soft blood pressure, and some bradycardia, so I will hold his digoxin, Entresto and Aldactone, but will continue with Coreg for now.  Sinus bradycardia -We will hold digoxin, back on Coreg, monitor on telemetry.  Elevated BUN BUN 50, creatinine 1.32 Monitor for evidence of bleeding or dehydration  Coronary artery disease status post CABG Denies anginal symptoms at the time of this visit. Continue home aspirin and home statin  Essential hypertension, currently BPs are soft Maintain MAP greater than 65 Continue to monitor vital signs  Transaminitis -due to Covid, monitor closely  Hyperglycemia A1c 6.1 Started high-dose IV steroids, will also start insulin sliding scale for steroid-induced hyperglycemia  CKD 2 Appears to be at his baseline creatinine 1.3 with GFR greater than 60 Avoid nephrotoxins  History of penile cancer/moderate protein calorie malnutrition      Condition - Extremely Guarded  Family Communication  :    Code Status :  Full  Consults  :  none  Procedures  :  none  PUD Prophylaxis : protonix  Disposition Plan  :  Status is: Inpatient  Remains inpatient appropriate because:Hemodynamically unstable and IV treatments appropriate due to intensity of illness or inability to take PO   Dispo:  Patient From: Home  Planned Disposition: Home  Expected discharge date: 12/19/19  Medically stable for discharge: No       DVT Prophylaxis  :  Lovenox -   Lab Results  Component Value Date   PLT 217 12/14/2019    Diet :  Diet Order            Diet heart healthy/carb modified Room service appropriate? Yes; Fluid consistency: Thin  Diet effective now                   Inpatient Medications  Scheduled Meds: . vitamin C  500 mg Oral Daily  . aspirin EC  81 mg Oral QHS  . atorvastatin  40 mg Oral q1800  . carvedilol  3.125 mg Oral BID WC  . cholecalciferol  1,000 Units Oral Daily  . enoxaparin (LOVENOX) injection  40 mg Subcutaneous Daily  . feeding supplement (GLUCERNA SHAKE)  237 mL Oral TID BM  . folic acid  1 mg Oral Daily  . insulin aspart  0-9 Units Subcutaneous TID WC  . Ipratropium-Albuterol  1 puff Inhalation Q6H  . multivitamin with minerals  1 tablet Oral Daily  . thiamine  100 mg Oral Daily  . zinc sulfate  220 mg Oral Daily   Continuous Infusions: . [START ON 12/15/2019] remdesivir 100 mg in NS 100 mL     PRN Meds:.guaiFENesin-dextromethorphan  Antibiotics  :    Anti-infectives (From admission, onward)   Start     Dose/Rate Route Frequency Ordered Stop   12/15/19 1000  remdesivir 100 mg in sodium chloride 0.9 % 100 mL IVPB       "Followed by" Linked Group Details   100 mg 200 mL/hr over 30 Minutes Intravenous Daily 12/14/19 0002 12/19/19 0959   12/14/19 1000  remdesivir 100 mg in sodium chloride 0.9 % 100 mL IVPB  Status:  Discontinued       "Followed by" Linked Group Details   100 mg 200 mL/hr over 30 Minutes Intravenous Daily 12/13/19 2358 12/14/19 0001   12/14/19 0030  remdesivir 100 mg in sodium chloride 0.9 % 100 mL IVPB       "Followed by" Linked Group Details   100 mg 200 mL/hr over 30 Minutes Intravenous Every 30 min 12/14/19 0002 12/14/19 0210   12/14/19 0000  remdesivir 200 mg in sodium chloride 0.9% 250 mL IVPB  Status:  Discontinued       "Followed by" Linked Group Details   200 mg 580 mL/hr over 30 Minutes Intravenous Once 12/13/19 2358 12/14/19 0001   12/13/19 2245  vancomycin (VANCOREADY) IVPB 2000 mg/400 mL        2,000 mg 200 mL/hr over 120 Minutes Intravenous  Once 12/13/19 2241 12/14/19 0210   12/13/19 2245  ceFEPIme (MAXIPIME) 2 g in sodium chloride 0.9 % 100 mL IVPB        2 g 200 mL/hr over 30  Minutes Intravenous  Once 12/13/19 2241 12/14/19 0002         Aren Pryde M.D on 12/14/2019 at 1:55 PM   To page go to [email or web address]   [facility] -  Office  [phone]    Objective:   Vitals:   12/14/19 0717 12/14/19 0915 12/14/19 0916 12/14/19 1300  BP: 100/60   116/64  Pulse: (!) 57   60  Resp: 18   (!) 25  Temp: 98.6 F (37 C)   97.7 F (36.5 C)  TempSrc: Axillary   Axillary  SpO2: 91% 90% 91% 97%  Weight:      Height:        Wt Readings from Last 3 Encounters:  12/14/19 115.2 kg  09/10/19 114.8 kg  08/13/19 114.8 kg     Intake/Output Summary (Last 24 hours) at 12/14/2019 1355 Last data filed at 12/14/2019 1300 Gross per 24 hour  Intake 1220 ml  Output 875 ml  Net 345 ml     Physical Exam  Awake Alert, No new F.N deficits, Normal affect Symmetrical Chest wall movement, Good air movement bilaterally, scattered rales RRR,No Gallops,Rubs or new Murmurs, No Parasternal Heave +ve B.Sounds, Abd Soft, No tenderness. No Cyanosis, Clubbing or edema, No new Rash or bruise .    Data Review:    CBC Recent Labs  Lab 12/13/19 2147 12/13/19 2335 12/14/19 0543  WBC 4.9  --  4.9  HGB 12.1* 10.5* 12.0*  HCT 37.5* 31.0* 38.1*  PLT 244  --  217  MCV 87.8  --  88.4  MCH 28.3  --  27.8  MCHC 32.3  --  31.5  RDW 14.7  --  14.7  LYMPHSABS  --   --  0.4*  MONOABS  --   --  0.2  EOSABS  --   --  0.1  BASOSABS  --   --  0.0    Recent Labs  Lab 12/13/19 2147 12/13/19 2335 12/14/19 0543 12/14/19 0545 12/14/19 0546  NA 139 140 140  --   --   K 4.2 3.8 4.1  --   --   CL 111  --  112*  --   --   CO2 18*  --  15*  --   --   GLUCOSE 124*  --  125*  --   --   BUN 50*  --  48*  --   --   CREATININE 1.32*  --  1.23  --   --   CALCIUM 7.5*  --  7.3*  --   --   AST 86*  --  75*  --   --   ALT 92*  --  83*  --   --   ALKPHOS 73  --  70  --   --   BILITOT 1.4*  --  1.5*  --   --   ALBUMIN 2.6*  --  2.4*  --   --   MG  --   --  3.6*  --   --    CRP  --   --  6.6*  --   --   DDIMER  --   --  3.17*  --   --   PROCALCITON  --   --  <0.10  --   --   LATICACIDVEN 1.4  --   --   --   --   TSH  --   --   --  0.056*  --   HGBA1C  --   --   --   --  6.1*  BNP 443.4*  --   --   --   --     ------------------------------------------------------------------------------------------------------------------ No results for input(s): CHOL, HDL, LDLCALC, TRIG, CHOLHDL, LDLDIRECT in the last 72 hours.  Lab Results  Component Value Date   HGBA1C 6.1 (H) 12/14/2019   ------------------------------------------------------------------------------------------------------------------ Recent Labs    12/14/19 2409  TSH 0.056*    Cardiac Enzymes No results for input(s): CKMB, TROPONINI, MYOGLOBIN in the last 168 hours.  Invalid input(s): CK ------------------------------------------------------------------------------------------------------------------    Component Value Date/Time   BNP 443.4 (H) 12/13/2019 2147    Micro Results Recent Results (from the past 240 hour(s))  Respiratory Panel by RT PCR (Flu A&B, Covid) - Nasopharyngeal Swab     Status: Abnormal   Collection Time: 12/13/19  9:47 PM   Specimen: Nasopharyngeal Swab; Nasopharyngeal(NP) swabs in vial transport medium  Result Value Ref Range Status   SARS Coronavirus 2 by RT PCR POSITIVE (A) NEGATIVE Final    Comment: RESULT CALLED TO, READ BACK BY AND VERIFIED WITH: E SULLIVAN RN 12/13/19 2330 JDW (NOTE) SARS-CoV-2 target nucleic acids are DETECTED.  SARS-CoV-2 RNA is generally detectable in upper respiratory specimens  during the acute phase of infection. Positive results are indicative of the presence of the identified virus, but do not rule out bacterial infection or co-infection with other pathogens not detected by the test. Clinical correlation with patient history and other diagnostic information is necessary to determine patient infection status. The expected  result is Negative.  Fact Sheet for Patients:  PinkCheek.be  Fact Sheet for Healthcare Providers: GravelBags.it  This test is not yet approved or cleared by the [place] FDA and  has been authorized for detection and/or diagnosis of SARS-CoV-2 by FDA under an Emergency Use Authorization (EUA).  This EUA will remain in effect (meaning this test can be use d) for the duration of  the COVID-19 declaration under Section 564(b)(1) of the Act, 21 U.S.C. section 360bbb-3(b)(1), unless the authorization is terminated or revoked sooner.      Influenza A by PCR NEGATIVE NEGATIVE Final   Influenza B by PCR NEGATIVE NEGATIVE Final    Comment: (NOTE) The Xpert Xpress SARS-CoV-2/FLU/RSV assay is intended as an aid in  the diagnosis of influenza from Nasopharyngeal swab specimens and  should not be used as a sole basis for treatment. Nasal washings and  aspirates are unacceptable for Xpert Xpress SARS-CoV-2/FLU/RSV  testing.  Fact Sheet for Patients: PinkCheek.be  Fact Sheet for Healthcare Providers: GravelBags.it  This test is not yet approved or cleared by the [place] FDA and  has been authorized for detection and/or diagnosis of SARS-CoV-2 by  FDA under an Emergency Use Authorization (EUA). This EUA will remain  in effect (meaning this test can be used) for the duration of the  Covid-19 declaration under Section 564(b)(1) of the Act, 21  U.S.C. section 360bbb-3(b)(1), unless the authorization is  terminated or revoked. Performed at [facility], [place] [address] [place], [place] [address]   Blood culture (routine x 2)     Status: None (Preliminary result)   Collection Time: 12/13/19 10:45 PM   Specimen: BLOOD  Result Value Ref Range Status   Specimen Description BLOOD RIGHT ANTECUBITAL  Final   Special Requests   Final    BOTTLES DRAWN AEROBIC AND  ANAEROBIC Blood Culture adequate volume   Culture   Final    NO GROWTH < 12 HOURS Performed at [facility], [place] [address], [place], [place] [address]    Report Status PENDING  Incomplete  Blood culture (routine x 2)     Status: None (Preliminary result)   Collection Time: 12/13/19 11:27 PM   Specimen: BLOOD RIGHT HAND  Result Value Ref Range Status   Specimen Description BLOOD RIGHT HAND  Final   Special Requests   Final  BOTTLES DRAWN AEROBIC AND ANAEROBIC Blood Culture adequate volume   Culture   Final    NO GROWTH < 12 HOURS Performed at [facility], [place] [address] [place], [place] [address]    Report Status PENDING  Incomplete    Radiology Reports DG Chest Portable 1 View  Result Date: 12/13/2019 CLINICAL DATA:  Shortness of breath, suspicion of COVID EXAM: PORTABLE CHEST 1 VIEW COMPARISON:  Radiograph 01/07/2019, CT 11/20/2018 FINDINGS: Heterogeneous consolidative and hazy opacity is present in a lower lung and peripheral predominance most coalescent in the left lung periphery. Some more bandlike opacities, possibly subsegmental atelectatic changes or scarring. No pneumothorax. No visible effusion though the costophrenic sulci are partially collimated. Postsurgical changes related to prior CABG including intact and aligned sternotomy wires and multiple surgical clips projecting over the mediastinum. Telemetry leads overlie the chest. The aorta is calcified. The remaining cardiomediastinal contours are unremarkable. Degenerative changes are present in the imaged spine and shoulders. IMPRESSION: 1. Heterogeneous consolidative and hazy opacity in a lower lung and peripheral predominance most coalescent in the left lung periphery, compatible with a multifocal pneumonia including atypical viral etiology in the appropriate clinical setting. 2. More bandlike opacities likely reflect subsegmental atelectatic changes or scarring. Electronically Signed   By: Lovena Le M.D.    On: 12/13/2019 22:28

## 2019-12-14 NOTE — H&P (Addendum)
History and Physical  Frank Love EXH:[id number] DOB: 1955-12-31 DOA: 12/13/2019  Referring physician:  Dr Lurena Nida, EDP PCP: [name], NP  Outpatient Specialists: Cardiology, urology Patient coming from: Home.  Chief Complaint: Covid exposure, generalized weakness and hypoxia  HPI: Frank Love is a 64 y.o. male with medical history significant for chronic systolic CHF, ischemic cardiomyopathy with LVEF 35 to 40%, coronary artery disease status post CABG, penile cancer, carotid artery stenosis, who presented to [facility] via EMS after being found severely hypoxic at home.  Endorses worsening fatigue for a few weeks.  Initially called EMS for his wife after she syncopized at home.  While EMS was there, they evaluating him too due to his weak appearance.  He was found to be severely hypoxic with O2 saturation in the 70s on room air, was placed on nonrebreather.  Daughter tested positive for Covid-19.  Patient is not vaccinated for COVID-19.  Admits to nonproductive cough.  Denies pleuritic pain, abdominal pain or diarrhea.  In the ED, he required nonrebreather and high flow nasal cannula to maintain O2 saturation greater than 92%.  Work-up in the ED revealed severe acute hypoxic respiratory failure secondary to COVID-19 viral pneumonia.  TRH, hospitalist team, asked to admit.  ED Course: Initially hypotensive with a MAP of 63 which improved to 67.  Bradycardic with heart rate in the 50s.  Lab studies remarkable for elevated BNP 443, troponin S 64, creatinine 1.32, hemoglobin 10.5.  Chest x-ray consistent with multifocal viral pneumonia or atypical pneumonia.  Review of Systems: Review of systems as noted in the HPI. All other systems reviewed and are negative.   Past Medical History:  Diagnosis Date  . Cancer ([place])    penile cancer  . Chronic pain of right knee   . Coronary artery disease   . Hypertension   . Phimosis    Past Surgical History:  Procedure Laterality Date  .  CIRCUMCISION N/A 10/12/2018   Procedure: CIRCUMCISION ADULT;  Surgeon: Franchot Gallo, MD;  Location: [facility];  Service: Urology;  Laterality: N/A;  45 mins  . CO2 LASER APPLICATION N/A 04/01/1015   Procedure: CO2 LASER APPLICATION;  Surgeon: Franchot Gallo, MD;  Location: [facility];  Service: Urology;  Laterality: N/A;  30 MINS  . COLONOSCOPY    . CORONARY ARTERY BYPASS GRAFT N/A 12/12/2018   Procedure: CORONARY ARTERY BYPASS GRAFTING (CABG) x four, using bilateral internal mammary arteries and right leg greater saphenous vein harvested endoscopically;  Surgeon: [name], MD;  Location: [place];  Service: Open Heart Surgery;  Laterality: N/A;  . LEFT HEART CATH AND CORONARY ANGIOGRAPHY N/A 11/28/2018   Procedure: LEFT HEART CATH AND CORONARY ANGIOGRAPHY;  Surgeon: Burnell Blanks, MD;  Location: [place] [facility];  Service: Cardiovascular;  Laterality: N/A;  . MEDIASTINAL EXPLORATION N/A 12/12/2018   Procedure: Mediastinal Re-Exploration after bypass graft;  Surgeon: [name], MD;  Location: [place];  Service: Open Heart Surgery;  Laterality: N/A;  . TEE WITHOUT CARDIOVERSION N/A 12/12/2018   Procedure: TRANSESOPHAGEAL ECHOCARDIOGRAM (TEE);  Surgeon: [name], MD;  Location: [place];  Service: Open Heart Surgery;  Laterality: N/A;  . TOOTH EXTRACTION    . TOTAL HIP ARTHROPLASTY Right 03/08/2019   Procedure: RIGHT TOTAL HIP ARTHROPLASTY ANTERIOR APPROACH;  Surgeon: Mcarthur Rossetti, MD;  Location: [facility];  Service: Orthopedics;  Laterality: Right;    Social History:  reports that he has never smoked. He has never used smokeless tobacco. He  reports that he does not drink alcohol and does not use drugs.   No Known Allergies  Family History  Problem Relation Age of Onset  . Diabetes Mother   . Heart disease Mother       Prior to Admission medications   Medication Sig Start Date End Date Taking? Authorizing Provider  aspirin EC 81 MG tablet Take 81 mg  by mouth at bedtime.     [provider]  atorvastatin (LIPITOR) 40 MG tablet TAKE 1 TABLET (40 MG TOTAL) BY MOUTH DAILY AT 6 PM. 08/22/19   [name], MD  carvedilol (COREG) 6.25 MG tablet Take 1 tablet (6.25 mg total) by mouth 2 (two) times daily with a meal. 03/18/19   Larey Dresser, MD  dapagliflozin propanediol (FARXIGA) 5 MG TABS tablet Take 5 mg by mouth every evening. 06/10/19   Bensimhon, Shaune Pascal, MD  DIGITEK 125 MCG tablet TAKE 1 TABLET (0.125 MG TOTAL) BY MOUTH DAILY. 08/14/19   [name], MD  furosemide (LASIX) 20 MG tablet Take 1 tablet (20 mg total) by mouth daily. 04/18/19 08/23/19  Larey Dresser, MD  nitroGLYCERIN (NITROSTAT) 0.4 MG SL tablet Place 1 tablet (0.4 mg total) under the tongue every 5 (five) minutes as needed for chest pain. NO more than 3 pills--if you have the need to take 2 pills or more call MD 12/28/18   Love, Ivan Anchors, PA-C  potassium chloride SA (KLOR-CON) 20 MEQ tablet Take 1 tablet (20 mEq total) by mouth daily. Takes while taking furosemide 05/13/19   Larey Dresser, MD  sacubitril-valsartan (ENTRESTO) 97-103 MG Take 1 tablet by mouth 2 (two) times daily. 06/10/19   Bensimhon, Shaune Pascal, MD  spironolactone (ALDACTONE) 25 MG tablet Take 1 tablet (25 mg total) by mouth daily. 04/23/19   Larey Dresser, MD    Physical Exam: BP (!) 94/53   Pulse (!) 53   Temp 98.6 F (37 C) (Oral)   Resp (!) 21   Ht 6\' 2"  (1.88 m)   Wt 117.9 kg   SpO2 93%   BMI 33.38 kg/m   . General: 64 y.o. year-old male well developed well nourished in no acute distress.  Alert and oriented x3.  On nonrebreather and high flow nasal cannula. . Cardiovascular: Regular rate and rhythm with no rubs or gallops.  No thyromegaly or JVD noted.  No lower extremity edema. 2/4 pulses in all 4 extremities. Marland Kitchen Respiratory: Mild rales at bases no wheezing noted.  Poor inspiratory effort.  On nonrebreather and high flow nasal cannula. . Abdomen: Soft nontender nondistended with  normal bowel sounds x4 quadrants. . Muskuloskeletal: No cyanosis, clubbing or edema noted bilaterally . Neuro: CN II-XII intact, strength, sensation, reflexes . Skin: No ulcerative lesions noted or rashes . Psychiatry: Judgement and insight appear normal. Mood is appropriate for condition and setting          Labs on Admission:  Basic Metabolic Panel: Recent Labs  Lab 12/13/19 2147 12/13/19 2335  NA 139 140  K 4.2 3.8  CL 111  --   CO2 18*  --   GLUCOSE 124*  --   BUN 50*  --   CREATININE 1.32*  --   CALCIUM 7.5*  --    Liver Function Tests: Recent Labs  Lab 12/13/19 2147  AST 86*  ALT 92*  ALKPHOS 73  BILITOT 1.4*  PROT 6.6  ALBUMIN 2.6*   Recent Labs  Lab 12/13/19 2147  LIPASE 54*  No results for input(s): AMMONIA in the last 168 hours. CBC: Recent Labs  Lab 12/13/19 2147 12/13/19 2335  WBC 4.9  --   HGB 12.1* 10.5*  HCT 37.5* 31.0*  MCV 87.8  --   PLT 244  --    Cardiac Enzymes: No results for input(s): CKTOTAL, CKMB, CKMBINDEX, TROPONINI in the last 168 hours.  BNP (last 3 results) Recent Labs    06/10/19 1540 12/13/19 2147  BNP 347.4* 443.4*    ProBNP (last 3 results) No results for input(s): PROBNP in the last 8760 hours.  CBG: No results for input(s): GLUCAP in the last 168 hours.  Radiological Exams on Admission: DG Chest Portable 1 View  Result Date: 12/13/2019 CLINICAL DATA:  Shortness of breath, suspicion of COVID EXAM: PORTABLE CHEST 1 VIEW COMPARISON:  Radiograph 01/07/2019, CT 11/20/2018 FINDINGS: Heterogeneous consolidative and hazy opacity is present in a lower lung and peripheral predominance most coalescent in the left lung periphery. Some more bandlike opacities, possibly subsegmental atelectatic changes or scarring. No pneumothorax. No visible effusion though the costophrenic sulci are partially collimated. Postsurgical changes related to prior CABG including intact and aligned sternotomy wires and multiple surgical clips  projecting over the mediastinum. Telemetry leads overlie the chest. The aorta is calcified. The remaining cardiomediastinal contours are unremarkable. Degenerative changes are present in the imaged spine and shoulders. IMPRESSION: 1. Heterogeneous consolidative and hazy opacity in a lower lung and peripheral predominance most coalescent in the left lung periphery, compatible with a multifocal pneumonia including atypical viral etiology in the appropriate clinical setting. 2. More bandlike opacities likely reflect subsegmental atelectatic changes or scarring. Electronically Signed   By: Lovena Le M.D.   On: 12/13/2019 22:28    EKG: I independently viewed the EKG done and my findings are as followed: Sinus bradycardia rate of 55 with nonspecific ST-T changes.  Assessment/Plan Present on Admission: . Acute hypoxemic respiratory failure due to COVID-19 [facility])  Active Problems:   Acute hypoxemic respiratory failure due to COVID-19 (HCC)  Severe acute hypoxic respiratory failure secondary to COVID-19 viral pneumonia O2 saturation in the 70s on room air Not previously on oxygen supplementation Currently requiring 15 L nonrebreather +15 L high flow nasal cannula to maintain O2 saturation greater than 92% Pronate as tolerated or side sleeping The patient provided consent to use COVID-19 directed therapies. Start IV remdesivir x5 days, p.o. baricitinib, IV Solu-Medrol twice daily Start bronchodilators and pulmonary toilet Incentive spirometer Mobilize as tolerated Vitamin C, D3 and zinc Trend inflammatory markers Maintain O2 saturation greater than 92%  COVID-19 viral pneumonia Personally reviewed chest x-ray done on 12/13/2019 which shows bilateral pulmonary infiltrates consistent with viral pneumonia. Due to concern for superimposed bacterial pneumonia patient received IV antibiotics in the ED IV vancomycin and cefepime, single dose completed Obtain procalcitonin, if positive start IV  antibiotics Currently afebrile with no leukocytosis  Elevated troponin, suspect demand ischemia in the setting of severe hypoxia Initial troponin S 64 No evidence of acute ischemia on twelve-lead EKG Denies any anginal symptoms at the time of this visit. Cycle troponin  Fatigue/generalized weakness suspect secondary to COVID-19 viral infection Obtain UA, pending Treat underlying conditions Encourage increase oral protein calorie intake PT OT to assess Fall precautions  Chronic systolic CHF Presented with elevated BNP 447 Euvolemic on exam Last 2D echo was on 04/18/2019 showing LVEF 35 to 40% Cardiac medications on hold due to soft blood pressures Start strict I's and O's and daily weight  Sinus bradycardia Suspect secondary to  beta-blocker home medication which is on hold due to soft blood pressures Obtain TSH Continue to closely monitor on telemetry  Non anion gap metabolic acidosis Presented with serum bicarb 18, anion gap 10 Lactic acid normal 1.4 Monitor and repeat BMP in the morning  Elevated BUN BUN 50, creatinine 1.32 Monitor for evidence of bleeding or dehydration  Coronary artery disease status post CABG Denies anginal symptoms at the time of this visit. Continue home aspirin and home statin  Essential hypertension, currently BPs are soft Maintain MAP greater than 65 Continue to monitor vital signs  Hyperglycemia Obtain hemoglobin A1c Started high-dose IV steroids, will also start insulin sliding scale for steroid-induced hyperglycemia  CKD 2 Appears to be at his baseline creatinine 1.3 with GFR greater than 60 Avoid nephrotoxins  History of penile cancer/moderate protein calorie malnutrition Albumin 2.6 Start oral supplement, Glucerna Encourage increase oral protein calorie intake     DVT prophylaxis: Subcu Lovenox daily  Code Status: Full code as stated by the patient himself.  Family Communication: None at bedside.  Disposition Plan: Admit  to stepdown unit.  Consults called: None.  Admission status: Inpatient status.   Status is: Inpatient    Dispo:  Patient From: Home  Planned Disposition: Home  Expected discharge date: 12/19/19  Medically stable for discharge: No, ongoing management of severe acute hypoxic respiratory failure, COVID-19 viral pneumonia, elevated troponin.        Kayleen Memos MD [facility] Pager [phone]  If 7PM-7AM, please contact night-coverage [email or web address] Password [email or web address]  12/14/2019, 12:05 AM

## 2019-12-14 NOTE — ED Notes (Signed)
+  SDU, report called when bed ready Breakfast Ordered

## 2019-12-14 NOTE — Plan of Care (Signed)
  Problem: Education: Goal: Knowledge of risk factors and measures for prevention of condition will improve Outcome: Progressing   Problem: Respiratory: Goal: Will maintain a patent airway Outcome: Progressing Goal: Complications related to the disease process, condition or treatment will be avoided or minimized Outcome: Progressing   

## 2019-12-14 NOTE — Progress Notes (Signed)
PT Cancellation Note  Patient Details Name: Frank Love MRN: [id number] DOB: [birth year]   Cancelled Treatment:    Reason Eval/Treat Not Completed: (P) Medical issues which prohibited therapy Pt with poor oxygenation, RN plan to prone. RN request follow back tomorrow for Evaluation.   Delsin Copen B. Migdalia Dk PT, DPT Acute Rehabilitation Services Pager [phone] Office [phone]    [place] 12/14/2019, 1:36 PM

## 2019-12-14 NOTE — ED Notes (Signed)
5W RN Janett Billow notified that pt coming upstairs

## 2019-12-14 NOTE — Progress Notes (Signed)
OT Cancellation Note  Patient Details Name: Frank Love MRN: [id number] DOB: Mar 31, 1955   Cancelled Treatment:    Reason Eval/Treat Not Completed: Patient not medically ready (RN quest hold. Pt SpO2 dropping into 60s with simple movement on 35L HHFNC and 15L NRB. RN focusing on prone positioning today.)  [place], OTR/L Acute Rehab Pager: [phone] Office: [phone] 12/14/2019, 1:31 PM

## 2019-12-15 ENCOUNTER — Inpatient Hospital Stay (HOSPITAL_COMMUNITY): Payer: Self-pay

## 2019-12-15 DIAGNOSIS — U071 COVID-19: Secondary | ICD-10-CM

## 2019-12-15 DIAGNOSIS — R7989 Other specified abnormal findings of blood chemistry: Secondary | ICD-10-CM

## 2019-12-15 LAB — COMPREHENSIVE METABOLIC PANEL
ALT: 67 U/L — ABNORMAL HIGH (ref 0–44)
AST: 47 U/L — ABNORMAL HIGH (ref 15–41)
Albumin: 2.2 g/dL — ABNORMAL LOW (ref 3.5–5.0)
Alkaline Phosphatase: 64 U/L (ref 38–126)
Anion gap: 10 (ref 5–15)
BUN: 43 mg/dL — ABNORMAL HIGH (ref 8–23)
CO2: 15 mmol/L — ABNORMAL LOW (ref 22–32)
Calcium: 7.5 mg/dL — ABNORMAL LOW (ref 8.9–10.3)
Chloride: 114 mmol/L — ABNORMAL HIGH (ref 98–111)
Creatinine, Ser: 1.16 mg/dL (ref 0.61–1.24)
GFR, Estimated: >60 mL/min (ref >60)
Glucose, Bld: 214 mg/dL — ABNORMAL HIGH (ref 70–99)
Potassium: 4.1 mmol/L (ref 3.5–5.1)
Sodium: 139 mmol/L (ref 135–145)
Total Bilirubin: 1 mg/dL (ref 0.3–1.2)
Total Protein: 6.4 g/dL — ABNORMAL LOW (ref 6.5–8.1)

## 2019-12-15 LAB — CBC WITH DIFFERENTIAL/PLATELET
Abs Immature Granulocytes: 0 10*3/uL (ref 0.00–0.07)
Basophils Absolute: 0 10*3/uL (ref 0.0–0.1)
Basophils Relative: 0 %
Eosinophils Absolute: 0 10*3/uL (ref 0.0–0.5)
Eosinophils Relative: 0 %
HCT: 35.3 % — ABNORMAL LOW (ref 39.0–52.0)
Hemoglobin: 11.5 g/dL — ABNORMAL LOW (ref 13.0–17.0)
Lymphocytes Relative: 5 %
Lymphs Abs: 0.2 10*3/uL — ABNORMAL LOW (ref 0.7–4.0)
MCH: 28 pg (ref 26.0–34.0)
MCHC: 32.6 g/dL (ref 30.0–36.0)
MCV: 85.9 fL (ref 80.0–100.0)
Monocytes Absolute: 0.1 10*3/uL (ref 0.1–1.0)
Monocytes Relative: 4 %
Neutro Abs: 3.1 10*3/uL (ref 1.7–7.7)
Neutrophils Relative %: 91 %
Platelets: 269 10*3/uL (ref 150–400)
RBC: 4.11 MIL/uL — ABNORMAL LOW (ref 4.22–5.81)
RDW: 14.7 % (ref 11.5–15.5)
WBC: 3.4 10*3/uL — ABNORMAL LOW (ref 4.0–10.5)
nRBC: 0 % (ref 0.0–0.2)
nRBC: 0 /100 WBC

## 2019-12-15 LAB — PHOSPHORUS: Phosphorus: 5.1 mg/dL — ABNORMAL HIGH (ref 2.5–4.6)

## 2019-12-15 LAB — D-DIMER, QUANTITATIVE: D-Dimer, Quant: 2.93 ug/mL-FEU — ABNORMAL HIGH (ref 0.00–0.50)

## 2019-12-15 LAB — GLUCOSE, CAPILLARY
Glucose-Capillary: 199 mg/dL — ABNORMAL HIGH (ref 70–99)
Glucose-Capillary: 189 mg/dL — ABNORMAL HIGH (ref 70–99)
Glucose-Capillary: 243 mg/dL — ABNORMAL HIGH (ref 70–99)
Glucose-Capillary: 191 mg/dL — ABNORMAL HIGH (ref 70–99)

## 2019-12-15 LAB — MAGNESIUM: Magnesium: 3.8 mg/dL — ABNORMAL HIGH (ref 1.7–2.4)

## 2019-12-15 LAB — FERRITIN: Ferritin: 521 ng/mL — ABNORMAL HIGH (ref 24–336)

## 2019-12-15 LAB — C-REACTIVE PROTEIN: CRP: 6.3 mg/dL — ABNORMAL HIGH (ref <1.0)

## 2019-12-15 LAB — BRAIN NATRIURETIC PEPTIDE: B Natriuretic Peptide: 667.8 pg/mL — ABNORMAL HIGH (ref 0.0–100.0)

## 2019-12-15 MED ORDER — HYDROCOD POLST-CPM POLST ER 10-8 MG/5ML PO SUER
5.0000 mL | Freq: Two times a day (BID) | ORAL | Status: DC | PRN
  Administered 2019-12-15 – 2019-12-22 (×8): 5 mL via ORAL
  Filled 2019-12-15 (×8): qty 5

## 2019-12-15 NOTE — Evaluation (Signed)
Physical Therapy Evaluation Patient Details Name: Frank Love MRN: [id number] DOB: August 29, 1955 Today's Date: 12/15/2019   History of Present Illness  64 y.o. male with medical history significant for chronic systolic CHF, ischemic cardiomyopathy with LVEF 35 to 40%, coronary artery disease status post CABG, penile cancer, carotid artery stenosis, who presented to [facility] via EMS after being found severely hypoxic at home. In the ED, he required nonrebreather and high flow nasal cannula to maintain O2 saturation greater than 92%.  Work-up in the ED revealed severe acute hypoxic respiratory failure secondary to COVID-19 viral pneumonia. Unvaccinated. Admitted 12/13/19  Clinical Impression  PTA pt living with wife in single story apartment with 9 steps to enter. Pt reports independence with mobility, ADLs and iADLs. after CABG last year. Pt is currently limited in safe mobility by increased O2 demand and decreased strength and endurance. Pt asleep supine in recliner on entry, increased O2 demand with coming to upright seated. PT recommending HHPT at discharge to improve mobility and endurance. PT will continue to follow acutely.  35L O2 via HHFNC and 15L NRB  Supine in recliner, BP 119/68 SaO2 91%O2  Seated upright in recliner BP 94/53 SaO2 dropped to 82%O2 pt removed NRB to blow nose and SaO2 dropped to 79%O2. With replacement of NRB, purse lip breathing and 3 min SaO2 rebounded to 93%O2      Follow Up Recommendations Home health PT;Supervision/Assistance - 24 hour;Supervision - Intermittent    Equipment Recommendations  Rolling walker with 5" wheels;3in1 (PT)    Recommendations for Other Services Speech consult     Precautions / Restrictions Precautions Precautions: Other (comment) Precaution Comments: 35 L HFNC, 100%FiO2 15L NRB Restrictions Weight Bearing Restrictions: No      Mobility  Bed Mobility               General bed mobility comments: OOB in recliner on entry      Transfers                 General transfer comment: deferred mobility as lunch had just arrived and PT recommended using energy to eat               Pertinent Vitals/Pain Pain Assessment: Faces Faces Pain Scale: Hurts little more Pain Location: chest with coughing Pain Descriptors / Indicators: Grimacing;Guarding Pain Intervention(s): Limited activity within patient's tolerance;Monitored during session;Repositioned    Home Living Family/patient expects to be discharged to:: Private residence Living Arrangements: Spouse/significant other Available Help at Discharge: Family;Available 24 hours/day Type of Home: Apartment Home Access: Stairs to enter Entrance Stairs-Rails: Right Entrance Stairs-Number of Steps: 3+6 Home Layout: One level        Prior Function Level of Independence: Independent               Hand Dominance   Dominant Hand: Right    Extremity/Trunk Assessment   Upper Extremity Assessment Upper Extremity Assessment: Defer to OT evaluation    Lower Extremity Assessment Lower Extremity Assessment: Generalized weakness       Communication   Communication: No difficulties  Cognition Arousal/Alertness: Awake/alert Behavior During Therapy: Flat affect Overall Cognitive Status: Within Functional Limits for tasks assessed                                        General Comments General comments (skin integrity, edema, etc.): BP in supine 119/68 with sitting upright in recliner  94/53    Exercises Other Exercises Other Exercises: flutter valve x 5    Assessment/Plan    PT Assessment Patient needs continued PT services  PT Problem List Decreased strength;Decreased activity tolerance;Decreased mobility;Cardiopulmonary status limiting activity;Pain       PT Treatment Interventions DME instruction;Gait training;Stair training;Functional mobility training;Therapeutic activities;Therapeutic exercise;Balance  training;Cognitive remediation;Patient/family education    PT Goals (Current goals can be found in the Care Plan section)  Acute Rehab PT Goals Patient Stated Goal: go to the beach PT Goal Formulation: With patient Time For Goal Achievement: 12/29/19 Potential to Achieve Goals: Fair    Frequency Min 3X/week    AM-PAC PT "6 Clicks" Mobility  Outcome Measure Help needed turning from your back to your side while in a flat bed without using bedrails?: A Little Help needed moving from lying on your back to sitting on the side of a flat bed without using bedrails?: A Little Help needed moving to and from a bed to a chair (including a wheelchair)?: A Little Help needed standing up from a chair using your arms (e.g., wheelchair or bedside chair)?: A Little Help needed to walk in hospital room?: A Lot Help needed climbing 3-5 steps with a railing? : Total 6 Click Score: 15    End of Session Equipment Utilized During Treatment: Oxygen Activity Tolerance: Treatment limited secondary to medical complications (Comment) (oxygen desaturation ) Patient left: in chair;with call bell/phone within reach Nurse Communication: Mobility status PT Visit Diagnosis: Other abnormalities of gait and mobility (R26.89);Muscle weakness (generalized) (M62.81);Difficulty in walking, not elsewhere classified (R26.2)    Time: 2094-7096 PT Time Calculation (min) (ACUTE ONLY): 29 min   Charges:   PT Evaluation $PT Eval Moderate Complexity: 1 Mod PT Treatments $Therapeutic Activity: 8-22 mins        Gaberiel Youngblood B. Migdalia Dk PT, DPT Acute Rehabilitation Services Pager [phone] Office [phone]   [place] 12/15/2019, 4:06 PM

## 2019-12-15 NOTE — Progress Notes (Signed)
Lower extremity venous has been completed.   Preliminary results in CV Proc.   Frank Love 12/15/2019 10:14 AM

## 2019-12-15 NOTE — Progress Notes (Signed)
PROGRESS NOTE                                                                             PROGRESS NOTE                                                                                                                                                                                                             Patient Demographics:    Frank Love, is a 64 y.o. male, DOB - [birth year], OVF:[id number]  Outpatient Primary MD for the patient is [name], NP    LOS - 2  Admit date - 12/13/2019    Chief Complaint  Patient presents with  . Covid Exposure  . Shortness of Breath  . generalized weakness       Brief Narrative     HPI: Frank Love is a 64 y.o. male with medical history significant for chronic systolic CHF, ischemic cardiomyopathy with LVEF 35 to 40%, coronary artery disease status post CABG, penile cancer, carotid artery stenosis, who presented to [facility] via EMS after being found severely hypoxic at home.  Endorses worsening fatigue for a few weeks.  Initially called EMS for his wife after she syncopized at home.  While EMS was there, they evaluating him too due to his weak appearance.  He was found to be severely hypoxic with O2 saturation in the 70s on room air, was placed on nonrebreather.  Daughter tested positive for Covid-19.  Patient is not vaccinated for COVID-19.  Admits to nonproductive cough.  Denies pleuritic pain, abdominal pain or diarrhea.  In the ED, he required nonrebreather and high flow nasal cannula to maintain O2 saturation greater than 92%.  Work-up in the ED revealed severe acute hypoxic respiratory failure secondary to COVID-19 viral pneumonia.  TRH, hospitalist team, asked to admit.  ED Course: Initially hypotensive with a MAP of 63 which improved to 67.  Bradycardic with heart rate in the 50s.  Lab studies remarkable for elevated BNP 443, troponin S 64, creatinine 1.32, hemoglobin 10.5.  Chest x-ray consistent  with multifocal viral pneumonia or atypical pneumonia.    Subjective:    [name] today denies any chest pain, still reporting shortness of breath, cough .    Assessment  & Plan :    Active Problems:   Acute hypoxemic respiratory failure due to COVID-19 [facility])  Acute Hypoxic Resp. Failure due to Acute Covid 19 Viral Pneumonitis during the ongoing 2020 Covid 19 Pandemic  -Unfortunately patient is unvaccinated -Appears to be with severe COVID-19 pneumonia and lung injury, he is with significant oxygen requirement and heated high flow and NRB intermittently. -Continue with IV steroids, has been changed to IV Solu-Medrol -Received IV remdesivir -Received Actemra 11/13/2019 -Continue to trend inflammatory markers, dimer segments elevated, continue with Lovenox 0.5 mg/kg every 12 hours, venous Dopplers with no evidence of DVT . Encouraged the patient to sit up in chair in the daytime use I-S and flutter valve for pulmonary toiletry and then prone in bed when at night.  Will advance activity and titrate down oxygen as possible.  Actemra/Baricitinib  off label use - patient was told that if COVID-19 pneumonitis gets worse we might potentially use Actemra off label, patient denies any known history of active diverticulitis, tuberculosis or hepatitis, understands the risks and benefits and wants to proceed with Actemra treatment if required.     SpO2: 90 % O2 Flow Rate (L/min): 35 L/min FiO2 (%): 100 %  Recent Labs  Lab 12/13/19 2147 12/14/19 0543 12/15/19 0350  WBC 4.9 4.9 3.4*  PLT 244 217 269  CRP  --  6.6* 6.3*  BNP 443.4*  --  667.8*  DDIMER  --  3.17* 2.93*  PROCALCITON  --  <0.10  --   AST 86* 75* 47*  ALT 92* 83* 67*  ALKPHOS 73 70 64  BILITOT 1.4* 1.5* 1.0  ALBUMIN 2.6* 2.4* 2.2*  LATICACIDVEN 1.4  --   --   SARSCOV2NAA POSITIVE*  --   --     FiO2 (%):  [100 %] 100 %  ABG     Component Value Date/Time   PHART 7.470 (H) 12/13/2019 2335   PCO2ART 22.4 (L)  12/13/2019 2335   PO2ART 118 (H) 12/13/2019 2335   HCO3 16.3 (L) 12/13/2019 2335   TCO2 17 (L) 12/13/2019 2335   ACIDBASEDEF 6.0 (H) 12/13/2019 2335   O2SAT 99.0 12/13/2019 2335    Chronic systolic CHF/CAD status post CABG Presented with elevated BNP 447 Euvolemic on exam Last 2D echo was on 04/18/2019 showing LVEF 35 to 40% -With soft blood pressure, and some bradycardia, so I will hold his digoxin, Entresto and Aldactone, but will continue with Coreg for now, heart rate remains in the 50s currently, blood pressure remains soft, so I will hold on resuming rest of his home meds.  Sinus bradycardia -We will hold digoxin, back on Coreg, monitor on telemetry.  Elevated BUN BUN 50, creatinine 1.32 Monitor for evidence of bleeding or dehydration  Coronary artery disease status post CABG Denies anginal symptoms at the time of this visit. Continue home aspirin and home statin  Essential hypertension, currently BPs are soft Maintain MAP greater than 65 Continue to monitor vital signs  Transaminitis -due to Covid, monitor closely  Hyperglycemia A1c 6.1 Started high-dose IV steroids, continue with sliding scale, would add Tradjenta if remains elevated.  CKD 2 Appears to be at his baseline creatinine 1.3 with GFR greater than 60 Avoid nephrotoxins  History of penile cancer/moderate protein calorie malnutrition  Condition - Extremely Guarded  Family Communication  : Wife updated who is hospitalized as well, as well daughter updated by phone daily.  Code Status :  Full  Consults  :  none  Procedures  :  none  PUD Prophylaxis : protonix  Disposition Plan  :    Status is: Inpatient  Remains inpatient appropriate because:Hemodynamically unstable and IV treatments appropriate due to intensity of illness or inability to take PO   Dispo:  Patient From: Home  Planned Disposition: Home  Expected discharge date: 12/19/19  Medically stable for discharge:  No       DVT Prophylaxis  :  Lovenox -   Lab Results  Component Value Date   PLT 269 12/15/2019    Diet :  Diet Order            Diet heart healthy/carb modified Room service appropriate? Yes; Fluid consistency: Thin  Diet effective now                  Inpatient Medications  Scheduled Meds: . vitamin C  500 mg Oral Daily  . aspirin EC  81 mg Oral QHS  . atorvastatin  40 mg Oral q1800  . carvedilol  3.125 mg Oral BID WC  . cholecalciferol  1,000 Units Oral Daily  . enoxaparin (LOVENOX) injection  60 mg Subcutaneous Q12H  . feeding supplement (GLUCERNA SHAKE)  237 mL Oral TID BM  . folic acid  1 mg Oral Daily  . insulin aspart  0-9 Units Subcutaneous TID WC  . Ipratropium-Albuterol  1 puff Inhalation Q6H  . methylPREDNISolone (SOLU-MEDROL) injection  60 mg Intravenous Q8H  . multivitamin with minerals  1 tablet Oral Daily  . pantoprazole  40 mg Oral Daily  . thiamine  100 mg Oral Daily  . zinc sulfate  220 mg Oral Daily   Continuous Infusions: . remdesivir 100 mg in NS 100 mL Stopped (12/15/19 0918)   PRN Meds:.chlorpheniramine-HYDROcodone  Antibiotics  :    Anti-infectives (From admission, onward)   Start     Dose/Rate Route Frequency Ordered Stop   12/15/19 1000  remdesivir 100 mg in sodium chloride 0.9 % 100 mL IVPB       "Followed by" Linked Group Details   100 mg 200 mL/hr over 30 Minutes Intravenous Daily 12/14/19 0002 12/19/19 0959   12/14/19 1000  remdesivir 100 mg in sodium chloride 0.9 % 100 mL IVPB  Status:  Discontinued       "Followed by" Linked Group Details   100 mg 200 mL/hr over 30 Minutes Intravenous Daily 12/13/19 2358 12/14/19 0001   12/14/19 0030  remdesivir 100 mg in sodium chloride 0.9 % 100 mL IVPB       "Followed by" Linked Group Details   100 mg 200 mL/hr over 30 Minutes Intravenous Every 30 min 12/14/19 0002 12/14/19 0210   12/14/19 0000  remdesivir 200 mg in sodium chloride 0.9% 250 mL IVPB  Status:  Discontinued        "Followed by" Linked Group Details   200 mg 580 mL/hr over 30 Minutes Intravenous Once 12/13/19 2358 12/14/19 0001   12/13/19 2245  vancomycin (VANCOREADY) IVPB 2000 mg/400 mL        2,000 mg 200 mL/hr over 120 Minutes Intravenous  Once 12/13/19 2241 12/14/19 0210   12/13/19 2245  ceFEPIme (MAXIPIME) 2 g in sodium chloride 0.9 % 100 mL IVPB        2 g 200 mL/hr over 30  Minutes Intravenous  Once 12/13/19 2241 12/14/19 0002         Jada Fass M.D on 12/15/2019 at 11:38 AM   To page go to [email or web address]   [facility] -  Office  [phone]    Objective:   Vitals:   12/15/19 0445 12/15/19 0804 12/15/19 0848 12/15/19 1117  BP:  121/61  (!) 99/54  Pulse:  (!) 59  63  Resp:  18  20  Temp:  97.7 F (36.5 C)  97.8 F (36.6 C)  TempSrc:  Oral  Oral  SpO2:  92% 92% 90%  Weight: 111.7 kg     Height:        Wt Readings from Last 3 Encounters:  12/15/19 111.7 kg  09/10/19 114.8 kg  08/13/19 114.8 kg     Intake/Output Summary (Last 24 hours) at 12/15/2019 1138 Last data filed at 12/15/2019 0918 Gross per 24 hour  Intake 580 ml  Output 800 ml  Net -220 ml     Physical Exam  Awake Alert, Oriented X 3, No new F.N deficits, Normal affect Symmetrical Chest wall movement, Good air movement bilaterally, scattered rales RRR,No Gallops,Rubs or new Murmurs, No Parasternal Heave +ve B.Sounds, Abd Soft, No tenderness, No rebound - guarding or rigidity. No Cyanosis, Clubbing or edema, No new Rash or bruise      Data Review:    CBC Recent Labs  Lab 12/13/19 2147 12/13/19 2335 12/14/19 0543 12/15/19 0350  WBC 4.9  --  4.9 3.4*  HGB 12.1* 10.5* 12.0* 11.5*  HCT 37.5* 31.0* 38.1* 35.3*  PLT 244  --  217 269  MCV 87.8  --  88.4 85.9  MCH 28.3  --  27.8 28.0  MCHC 32.3  --  31.5 32.6  RDW 14.7  --  14.7 14.7  LYMPHSABS  --   --  0.4* 0.2*  MONOABS  --   --  0.2 0.1  EOSABS  --   --  0.1 0.0  BASOSABS  --   --  0.0 0.0    Recent Labs  Lab  12/13/19 2147 12/13/19 2335 12/14/19 0543 12/14/19 0545 12/14/19 0546 12/15/19 0350  NA 139 140 140  --   --  139  K 4.2 3.8 4.1  --   --  4.1  CL 111  --  112*  --   --  114*  CO2 18*  --  15*  --   --  15*  GLUCOSE 124*  --  125*  --   --  214*  BUN 50*  --  48*  --   --  43*  CREATININE 1.32*  --  1.23  --   --  1.16  CALCIUM 7.5*  --  7.3*  --   --  7.5*  AST 86*  --  75*  --   --  47*  ALT 92*  --  83*  --   --  67*  ALKPHOS 73  --  70  --   --  64  BILITOT 1.4*  --  1.5*  --   --  1.0  ALBUMIN 2.6*  --  2.4*  --   --  2.2*  MG  --   --  3.6*  --   --  3.8*  CRP  --   --  6.6*  --   --  6.3*  DDIMER  --   --  3.17*  --   --  2.93*  PROCALCITON  --   --  <  0.10  --   --   --   LATICACIDVEN 1.4  --   --   --   --   --   TSH  --   --   --  0.056*  --   --   HGBA1C  --   --   --   --  6.1*  --   BNP 443.4*  --   --   --   --  667.8*    ------------------------------------------------------------------------------------------------------------------ No results for input(s): CHOL, HDL, LDLCALC, TRIG, CHOLHDL, LDLDIRECT in the last 72 hours.  Lab Results  Component Value Date   HGBA1C 6.1 (H) 12/14/2019   ------------------------------------------------------------------------------------------------------------------ Recent Labs    12/14/19 0545  TSH 0.056*    Cardiac Enzymes No results for input(s): CKMB, TROPONINI, MYOGLOBIN in the last 168 hours.  Invalid input(s): CK ------------------------------------------------------------------------------------------------------------------    Component Value Date/Time   BNP 667.8 (H) 12/15/2019 0350    Micro Results Recent Results (from the past 240 hour(s))  Respiratory Panel by RT PCR (Flu A&B, Covid) - Nasopharyngeal Swab     Status: Abnormal   Collection Time: 12/13/19  9:47 PM   Specimen: Nasopharyngeal Swab; Nasopharyngeal(NP) swabs in vial transport medium  Result Value Ref Range Status   SARS Coronavirus 2  by RT PCR POSITIVE (A) NEGATIVE Final    Comment: RESULT CALLED TO, READ BACK BY AND VERIFIED WITH: E SULLIVAN RN 12/13/19 2330 JDW (NOTE) SARS-CoV-2 target nucleic acids are DETECTED.  SARS-CoV-2 RNA is generally detectable in upper respiratory specimens  during the acute phase of infection. Positive results are indicative of the presence of the identified virus, but do not rule out bacterial infection or co-infection with other pathogens not detected by the test. Clinical correlation with patient history and other diagnostic information is necessary to determine patient infection status. The expected result is Negative.  Fact Sheet for Patients:  PinkCheek.be  Fact Sheet for Healthcare Providers: GravelBags.it  This test is not yet approved or cleared by the [place] FDA and  has been authorized for detection and/or diagnosis of SARS-CoV-2 by FDA under an Emergency Use Authorization (EUA).  This EUA will remain in effect (meaning this test can be use d) for the duration of  the COVID-19 declaration under Section 564(b)(1) of the Act, 21 U.S.C. section 360bbb-3(b)(1), unless the authorization is terminated or revoked sooner.      Influenza A by PCR NEGATIVE NEGATIVE Final   Influenza B by PCR NEGATIVE NEGATIVE Final    Comment: (NOTE) The Xpert Xpress SARS-CoV-2/FLU/RSV assay is intended as an aid in  the diagnosis of influenza from Nasopharyngeal swab specimens and  should not be used as a sole basis for treatment. Nasal washings and  aspirates are unacceptable for Xpert Xpress SARS-CoV-2/FLU/RSV  testing.  Fact Sheet for Patients: PinkCheek.be  Fact Sheet for Healthcare Providers: GravelBags.it  This test is not yet approved or cleared by the [place] FDA and  has been authorized for detection and/or diagnosis of SARS-CoV-2 by  FDA under an  Emergency Use Authorization (EUA). This EUA will remain  in effect (meaning this test can be used) for the duration of the  Covid-19 declaration under Section 564(b)(1) of the Act, 21  U.S.C. section 360bbb-3(b)(1), unless the authorization is  terminated or revoked. Performed at [facility], [place] [address], [place], [place] [address]   Blood culture (routine x 2)     Status: None (Preliminary result)   Collection Time: 12/13/19 10:45  PM   Specimen: BLOOD  Result Value Ref Range Status   Specimen Description BLOOD RIGHT ANTECUBITAL  Final   Special Requests   Final    BOTTLES DRAWN AEROBIC AND ANAEROBIC Blood Culture adequate volume   Culture   Final    NO GROWTH 2 DAYS Performed at [facility], [address], [place], [place] [address]    Report Status PENDING  Incomplete  Blood culture (routine x 2)     Status: None (Preliminary result)   Collection Time: 12/13/19 11:27 PM   Specimen: BLOOD RIGHT HAND  Result Value Ref Range Status   Specimen Description BLOOD RIGHT HAND  Final   Special Requests   Final    BOTTLES DRAWN AEROBIC AND ANAEROBIC Blood Culture adequate volume   Culture   Final    NO GROWTH 1 DAY Performed at [facility], [place] [address] [place], [place] [address]    Report Status PENDING  Incomplete    Radiology Reports DG Chest Portable 1 View  Result Date: 12/13/2019 CLINICAL DATA:  Shortness of breath, suspicion of COVID EXAM: PORTABLE CHEST 1 VIEW COMPARISON:  Radiograph 01/07/2019, CT 11/20/2018 FINDINGS: Heterogeneous consolidative and hazy opacity is present in a lower lung and peripheral predominance most coalescent in the left lung periphery. Some more bandlike opacities, possibly subsegmental atelectatic changes or scarring. No pneumothorax. No visible effusion though the costophrenic sulci are partially collimated. Postsurgical changes related to prior CABG including intact and aligned sternotomy wires and multiple surgical clips  projecting over the mediastinum. Telemetry leads overlie the chest. The aorta is calcified. The remaining cardiomediastinal contours are unremarkable. Degenerative changes are present in the imaged spine and shoulders. IMPRESSION: 1. Heterogeneous consolidative and hazy opacity in a lower lung and peripheral predominance most coalescent in the left lung periphery, compatible with a multifocal pneumonia including atypical viral etiology in the appropriate clinical setting. 2. More bandlike opacities likely reflect subsegmental atelectatic changes or scarring. Electronically Signed   By: Lovena Le M.D.   On: 12/13/2019 22:28   VAS [place] LOWER EXTREMITY VENOUS (DVT)  Result Date: 12/15/2019  Lower Venous DVT Study Indications: Elevated ddimer.  Comparison Study: 07/09/18 previous Performing Technologist: Abram Sander RVS  Examination Guidelines: A complete evaluation includes B-mode imaging, spectral Doppler, color Doppler, and power Doppler as needed of all accessible portions of each vessel. Bilateral testing is considered an integral part of a complete examination. Limited examinations for reoccurring indications may be performed as noted. The reflux portion of the exam is performed with the patient in reverse Trendelenburg.  +---------+---------------+---------+-----------+----------+--------------+ RIGHT    CompressibilityPhasicitySpontaneityPropertiesThrombus Aging +---------+---------------+---------+-----------+----------+--------------+ CFV      Full           Yes      Yes                                 +---------+---------------+---------+-----------+----------+--------------+ SFJ      Full                                                        +---------+---------------+---------+-----------+----------+--------------+ FV Prox  Full                                                        +---------+---------------+---------+-----------+----------+--------------+  FV Mid   Full                                                         +---------+---------------+---------+-----------+----------+--------------+ FV DistalFull                                                        +---------+---------------+---------+-----------+----------+--------------+ PFV      Full                                                        +---------+---------------+---------+-----------+----------+--------------+ POP      Full           Yes      Yes                                 +---------+---------------+---------+-----------+----------+--------------+ PTV      Full                                                        +---------+---------------+---------+-----------+----------+--------------+ PERO     Full                                                        +---------+---------------+---------+-----------+----------+--------------+   +---------+---------------+---------+-----------+----------+--------------+ LEFT     CompressibilityPhasicitySpontaneityPropertiesThrombus Aging +---------+---------------+---------+-----------+----------+--------------+ CFV      Full           Yes      Yes                                 +---------+---------------+---------+-----------+----------+--------------+ SFJ      Full                                                        +---------+---------------+---------+-----------+----------+--------------+ FV Prox  Full                                                        +---------+---------------+---------+-----------+----------+--------------+ FV Mid   Full                                                        +---------+---------------+---------+-----------+----------+--------------+  FV DistalFull                                                        +---------+---------------+---------+-----------+----------+--------------+ PFV      Full                                                         +---------+---------------+---------+-----------+----------+--------------+ POP      Full           Yes      Yes                                 +---------+---------------+---------+-----------+----------+--------------+ PTV      Full                                                        +---------+---------------+---------+-----------+----------+--------------+ PERO     Full                                                        +---------+---------------+---------+-----------+----------+--------------+     Summary: BILATERAL: - No evidence of deep vein thrombosis seen in the lower extremities, bilaterally. - No evidence of superficial venous thrombosis in the lower extremities, bilaterally. -No evidence of popliteal cyst, bilaterally. RIGHT: - Findings appear essentially unchanged compared to previous examination.   *See table(s) above for measurements and observations.    Preliminary

## 2019-12-16 LAB — CBC WITH DIFFERENTIAL/PLATELET
Abs Immature Granulocytes: 0.14 10*3/uL — ABNORMAL HIGH (ref 0.00–0.07)
Basophils Absolute: 0 10*3/uL (ref 0.0–0.1)
Basophils Relative: 0 %
Eosinophils Absolute: 0 10*3/uL (ref 0.0–0.5)
Eosinophils Relative: 0 %
HCT: 34.4 % — ABNORMAL LOW (ref 39.0–52.0)
Hemoglobin: 11.2 g/dL — ABNORMAL LOW (ref 13.0–17.0)
Immature Granulocytes: 2 %
Lymphocytes Relative: 5 %
Lymphs Abs: 0.4 10*3/uL — ABNORMAL LOW (ref 0.7–4.0)
MCH: 28.1 pg (ref 26.0–34.0)
MCHC: 32.6 g/dL (ref 30.0–36.0)
MCV: 86.4 fL (ref 80.0–100.0)
Monocytes Absolute: 0.3 10*3/uL (ref 0.1–1.0)
Monocytes Relative: 4 %
Neutro Abs: 8.1 10*3/uL — ABNORMAL HIGH (ref 1.7–7.7)
Neutrophils Relative %: 89 %
Platelets: 315 10*3/uL (ref 150–400)
RBC: 3.98 MIL/uL — ABNORMAL LOW (ref 4.22–5.81)
RDW: 14.7 % (ref 11.5–15.5)
WBC: 9 10*3/uL (ref 4.0–10.5)
nRBC: 0 % (ref 0.0–0.2)

## 2019-12-16 LAB — COMPREHENSIVE METABOLIC PANEL
ALT: 59 U/L — ABNORMAL HIGH (ref 0–44)
AST: 36 U/L (ref 15–41)
Albumin: 2.4 g/dL — ABNORMAL LOW (ref 3.5–5.0)
Alkaline Phosphatase: 53 U/L (ref 38–126)
Anion gap: 9 (ref 5–15)
BUN: 56 mg/dL — ABNORMAL HIGH (ref 8–23)
CO2: 19 mmol/L — ABNORMAL LOW (ref 22–32)
Calcium: 7.6 mg/dL — ABNORMAL LOW (ref 8.9–10.3)
Chloride: 112 mmol/L — ABNORMAL HIGH (ref 98–111)
Creatinine, Ser: 1.51 mg/dL — ABNORMAL HIGH (ref 0.61–1.24)
GFR, Estimated: 51 mL/min — ABNORMAL LOW (ref >60)
Glucose, Bld: 234 mg/dL — ABNORMAL HIGH (ref 70–99)
Potassium: 4.4 mmol/L (ref 3.5–5.1)
Sodium: 140 mmol/L (ref 135–145)
Total Bilirubin: 0.6 mg/dL (ref 0.3–1.2)
Total Protein: 6.1 g/dL — ABNORMAL LOW (ref 6.5–8.1)

## 2019-12-16 LAB — GLUCOSE, CAPILLARY
Glucose-Capillary: 236 mg/dL — ABNORMAL HIGH (ref 70–99)
Glucose-Capillary: 170 mg/dL — ABNORMAL HIGH (ref 70–99)
Glucose-Capillary: 221 mg/dL — ABNORMAL HIGH (ref 70–99)
Glucose-Capillary: 216 mg/dL — ABNORMAL HIGH (ref 70–99)

## 2019-12-16 LAB — PHOSPHORUS: Phosphorus: 4.2 mg/dL (ref 2.5–4.6)

## 2019-12-16 LAB — FERRITIN: Ferritin: 456 ng/mL — ABNORMAL HIGH (ref 24–336)

## 2019-12-16 LAB — MAGNESIUM: Magnesium: 3.5 mg/dL — ABNORMAL HIGH (ref 1.7–2.4)

## 2019-12-16 LAB — C-REACTIVE PROTEIN: CRP: 2.4 mg/dL — ABNORMAL HIGH (ref <1.0)

## 2019-12-16 LAB — D-DIMER, QUANTITATIVE: D-Dimer, Quant: 1.95 ug/mL-FEU — ABNORMAL HIGH (ref 0.00–0.50)

## 2019-12-16 LAB — BRAIN NATRIURETIC PEPTIDE: B Natriuretic Peptide: 560 pg/mL — ABNORMAL HIGH (ref 0.0–100.0)

## 2019-12-16 MED ORDER — ACETAMINOPHEN 325 MG PO TABS
650.0000 mg | ORAL_TABLET | Freq: Four times a day (QID) | ORAL | Status: DC | PRN
  Administered 2019-12-16 – 2019-12-21 (×5): 650 mg via ORAL
  Filled 2019-12-16 (×5): qty 2

## 2019-12-16 MED ORDER — IPRATROPIUM-ALBUTEROL 20-100 MCG/ACT IN AERS
1.0000 | INHALATION_SPRAY | Freq: Two times a day (BID) | RESPIRATORY_TRACT | Status: DC
  Administered 2019-12-16 – 2019-12-28 (×25): 1 via RESPIRATORY_TRACT
  Filled 2019-12-16: qty 4

## 2019-12-16 MED ORDER — INSULIN GLARGINE 100 UNIT/ML ~~LOC~~ SOLN
5.0000 [IU] | Freq: Every day | SUBCUTANEOUS | Status: DC
  Administered 2019-12-16 – 2019-12-17 (×2): 5 [IU] via SUBCUTANEOUS
  Filled 2019-12-16 (×2): qty 0.05

## 2019-12-16 NOTE — Progress Notes (Signed)
Occupational Therapy Evaluation Patient Details Name: Frank Love MRN: [id number] DOB: April 17, 1955 Today's Date: 12/16/2019    History of Present Illness 64 y.o. male with medical history significant for chronic systolic CHF, ischemic cardiomyopathy with LVEF 35 to 40%, coronary artery disease status post CABG, penile cancer, carotid artery stenosis, who presented to [facility] via EMS after being found severely hypoxic at home. In the ED, he required nonrebreather and high flow nasal cannula to maintain O2 saturation greater than 92%.  Work-up in the ED revealed severe acute hypoxic respiratory failure secondary to COVID-19 viral pneumonia. Unvaccinated. Admitted 12/13/19   Clinical Impression   PTA, pt modified independent with ADL and mobility using a cane. Pt seen on 30L HHFNC 90% FiO2. On entry, SpO2 low 90s with finger probe on forehead. Probe placed on ear and SpO2 86 with good pleth. Mobilized to chair with Min A with desat into low 80s with increased WOB, however able to return to 89 with pursed lip breathing. Pt complained of dizziness with moving to EOB. BP 84/64; HR 54. Mod A with LB ADL primarily due to poor activity tolerance. Once in chair, BP 90/51. Pt demonstrates slow processing but demonstrates good participation. Completed flutter valve and incentive spirometer exercises. At this time recommend follow up with [place]. Will follow acutely.     Follow Up Recommendations  Home health OT;Supervision/Assistance - 24 hour    Equipment Recommendations  3 in 1 bedside commode    Recommendations for Other Services       Precautions / Restrictions Precautions Precautions: Other (comment) Precaution Comments: 30L HHFNC; 90% FiO2      Mobility Bed Mobility Overal bed mobility: Needs Assistance Bed Mobility: Supine to Sit     Supine to sit: Min guard     General bed mobility comments: dizziness upon sitting    Transfers Overall transfer level: Needs assistance Equipment  used: 1 person hand held assist Transfers: Sit to/from Stand Sit to Stand: Min assist         General transfer comment: Pt unaware of wrapping himself up in cords    Balance Overall balance assessment: Needs assistance   Sitting balance-Leahy Scale: Good       Standing balance-Leahy Scale: Poor; reaching to stabilize self; very unsteady                             ADL either performed or assessed with clinical judgement   ADL Overall ADL's : Needs assistance/impaired Eating/Feeding: Independent   Grooming: Set up;Sitting   Upper Body Bathing: Set up;Sitting   Lower Body Bathing: Minimal assistance;Sit to/from stand   Upper Body Dressing : Set up;Sitting   Lower Body Dressing: Sit to/from stand;Moderate assistance   Toilet Transfer: Minimal assistance;Stand-pivot   Toileting- Clothing Manipulation and Hygiene: Sit to/from stand;Minimal assistance       Functional mobility during ADLs: Minimal assistance (stand pivot only)       Vision   Additional Comments: "light sensitive" held eyes closed at least half of session     Perception     Praxis      Pertinent Vitals/Pain Pain Assessment: No/denies pain     Hand Dominance Right   Extremity/Trunk Assessment Upper Extremity Assessment Upper Extremity Assessment: Generalized weakness   Lower Extremity Assessment Lower Extremity Assessment: Defer to PT evaluation   Cervical / Trunk Assessment Cervical / Trunk Assessment: Normal   Communication Communication Communication: No difficulties   Cognition Arousal/Alertness:  Awake/alert Behavior During Therapy: Flat affect;Anxious Overall Cognitive Status: Impaired/Different from baseline Area of Impairment: Attention;Safety/judgement;Awareness;Problem solving                   Current Attention Level: Selective     Safety/Judgement: Decreased awareness of safety Awareness: Emergent Problem Solving: Slow processing     General  Comments       Exercises Other Exercises Other Exercises: flutter valve x 5 Other Exercises: incentive spirometer x 5- pulling 1000 ml Other Exercises: pursed lip breathing Other Exercises: encouraged general BUE AROM through full ROM as tolerated   Shoulder Instructions      Home Living Family/patient expects to be discharged to:: Private residence Living Arrangements: Spouse/significant other Available Help at Discharge: Family;Available 24 hours/day Type of Home: Apartment Home Access: Stairs to enter [employer] of Steps: 3+6 Entrance Stairs-Rails: Right Home Layout: One level     Bathroom Shower/Tub: Teacher, early years/pre: Standard Bathroom Accessibility: Yes How Accessible: Accessible via walker Home Equipment: [place] - 2 wheels;Cane - single point;Tub bench;Toilet riser          Prior Functioning/Environment Level of Independence: Independent        Comments: Pt reports using cane for ambulation PTA, states he used both hands on cane due to hip pain being so bad. History of falls x2; part time vending work        OT Problem List: Decreased strength;Decreased activity tolerance;Impaired balance (sitting and/or standing);Decreased cognition;Decreased safety awareness;Decreased knowledge of use of DME or AE;Cardiopulmonary status limiting activity;Obesity      OT Treatment/Interventions: Self-care/ADL training;Therapeutic exercise;Neuromuscular education;Energy conservation;DME and/or AE instruction;Therapeutic activities;Cognitive remediation/compensation;Patient/family education;Balance training    OT Goals(Current goals can be found in the care plan section) Acute Rehab OT Goals Patient Stated Goal: to see his wife OT Goal Formulation: With patient Time For Goal Achievement: 12/30/19 Potential to Achieve Goals: Good  OT Frequency: Min 2X/week   Barriers to D/C:            Co-evaluation              AM-PAC OT "6 Clicks"  Daily Activity     Outcome Measure Help from another person eating meals?: None Help from another person taking care of personal grooming?: A Little Help from another person toileting, which includes using toliet, bedpan, or urinal?: A Little Help from another person bathing (including washing, rinsing, drying)?: A Little Help from another person to put on and taking off regular upper body clothing?: A Little Help from another person to put on and taking off regular lower body clothing?: A Lot 6 Click Score: 18   End of Session Equipment Utilized During Treatment: Oxygen (30L; 90% FiO2) Nurse Communication: Mobility status;Other (comment) (O2 Sts; BP)  Activity Tolerance: Patient tolerated treatment well Patient left: in chair;with call bell/phone within reach  OT Visit Diagnosis: Unsteadiness on feet (R26.81);Other abnormalities of gait and mobility (R26.89);Muscle weakness (generalized) (M62.81);Other symptoms and signs involving cognitive function;Dizziness and giddiness (R42)                Time: 3762-8315 OT Time Calculation (min): 36 min Charges:  OT Evaluation $OT Eval Moderate Complexity: 1 Mod OT Treatments $Self Care/Home Management : 8-22 mins  Maurie Boettcher, OT/L   Acute OT Clinical Specialist Acute Rehabilitation Services Pager [phone] Office [phone]   [facility] 12/16/2019, 1:25 PM

## 2019-12-16 NOTE — Progress Notes (Signed)
PROGRESS NOTE                                                                             PROGRESS NOTE                                                                                                                                                                                                             Patient Demographics:    Frank Love, is a 64 y.o. male, DOB - 1955/12/11, TDV:[id number]  Outpatient Primary MD for the patient is [name], NP    LOS - 3  Admit date - 12/13/2019    Chief Complaint  Patient presents with  . Covid Exposure  . Shortness of Breath  . generalized weakness       Brief Narrative     HPI: REVEL STELLMACH is a 64 y.o. male with medical history significant for chronic systolic CHF, ischemic cardiomyopathy with LVEF 35 to 40%, coronary artery disease status post CABG, penile cancer, carotid artery stenosis, who presented to [facility] via EMS after being found severely hypoxic at home.  Endorses worsening fatigue for a few weeks.  Initially called EMS for his wife after she syncopized at home.  While EMS was there, they evaluating him too due to his weak appearance.  He was found to be severely hypoxic with O2 saturation in the 70s on room air, was placed on nonrebreather.  Daughter tested positive for Covid-19.  Patient is not vaccinated for COVID-19.  Admits to nonproductive cough.  Denies pleuritic pain, abdominal pain or diarrhea.  In the ED, he required nonrebreather and high flow nasal cannula to maintain O2 saturation greater than 92%.  Work-up in the ED revealed severe acute hypoxic respiratory failure secondary to COVID-19 viral pneumonia.  TRH, hospitalist team, asked to admit.  ED Course: Initially hypotensive with a MAP of 63 which improved to 67.  Bradycardic with heart rate in the 50s.  Lab studies remarkable for elevated BNP 443, troponin S 64, creatinine 1.32, hemoglobin 10.5.  Chest x-ray consistent  with multifocal viral pneumonia or atypical pneumonia.    Subjective:    Alvey Brockel today denies any chest pain, still reporting shortness of breath, cough .    Assessment  & Plan :    Active Problems:   Acute hypoxemic respiratory failure due to COVID-19 [facility])  Acute Hypoxic Resp. Failure due to Acute Covid 19 Viral Pneumonitis during the ongoing 2020 Covid 19 Pandemic  -Unfortunately patient is unvaccinated -Appears to be with severe COVID-19 pneumonia and lung injury, he is with significant oxygen requirement on heated high flow, he is requiring 3 L on heated high flow, and NRB intermittently. -Continue with IV steroids, has been changed to IV Solu-Medrol -Received IV remdesivir -Received Actemra 11/13/2019 -Continue to trend inflammatory markers, dimer segments elevated, continue with Lovenox 0.5 mg/kg every 12 hours, venous Dopplers with no evidence of DVT . Encouraged the patient to sit up in chair in the daytime use I-S and flutter valve for pulmonary toiletry and then prone in bed when at night.  Will advance activity and titrate down oxygen as possible.  Actemra/Baricitinib  off label use - patient was told that if COVID-19 pneumonitis gets worse we might potentially use Actemra off label, patient denies any known history of active diverticulitis, tuberculosis or hepatitis, understands the risks and benefits and wants to proceed with Actemra treatment if required.     SpO2: 100 % O2 Flow Rate (L/min): 30 L/min FiO2 (%): (S) 90 % (Decreased to 90% due to SpO2 of 100%.)  Recent Labs  Lab 12/13/19 2147 12/14/19 0543 12/15/19 0350 12/16/19 0708  WBC 4.9 4.9 3.4* 9.0  PLT 244 217 269 315  CRP  --  6.6* 6.3* 2.4*  BNP 443.4*  --  667.8* 560.0*  DDIMER  --  3.17* 2.93* 1.95*  PROCALCITON  --  <0.10  --   --   AST 86* 75* 47* 36  ALT 92* 83* 67* 59*  ALKPHOS 73 70 64 53  BILITOT 1.4* 1.5* 1.0 0.6  ALBUMIN 2.6* 2.4* 2.2* 2.4*  LATICACIDVEN 1.4  --   --   --    SARSCOV2NAA POSITIVE*  --   --   --     FiO2 (%):  [90 %-100 %] 90 %  ABG     Component Value Date/Time   PHART 7.470 (H) 12/13/2019 2335   PCO2ART 22.4 (L) 12/13/2019 2335   PO2ART 118 (H) 12/13/2019 2335   HCO3 16.3 (L) 12/13/2019 2335   TCO2 17 (L) 12/13/2019 2335   ACIDBASEDEF 6.0 (H) 12/13/2019 2335   O2SAT 99.0 12/13/2019 2335    Chronic systolic CHF/CAD status post CABG Presented with elevated BNP 447 Euvolemic on exam Last 2D echo was on 04/18/2019 showing LVEF 35 to 40% -With soft blood pressure, and some bradycardia, so I will hold his digoxin, Entresto and Aldactone, rate is lowered this morning, and he remains with blood pressure in the 90s, so I will decrease his Coreg by half at this point, and at one point time will need to stop it. .  Sinus bradycardia -Need to hold digoxin, will decrease his Coreg.  Elevated BUN BUN 50, creatinine 1.32 Monitor for evidence of bleeding or dehydration  Coronary artery disease status post CABG Denies anginal symptoms at the time of this visit. Continue home aspirin and home statin  Essential hypertension, currently BPs are soft Maintain MAP greater than 65 Continue to monitor vital signs  Transaminitis -due to Covid, monitor closely  Hyperglycemia A1c  6.1 Started high-dose IV steroids, continue with sliding scale, remains elevated, will add low-dose Lantus.  CKD 2 Appears to be at his baseline creatinine 1.3 with GFR greater than 60 Avoid nephrotoxins  History of penile cancer/moderate protein calorie malnutrition      Condition - Extremely Guarded  Family Communication  : Wife updated who is hospitalized as well, as well daughter updated by phone daily.  Code Status :  Full  Consults  :  none  Procedures  :  none  PUD Prophylaxis : protonix  Disposition Plan  :    Status is: Inpatient  Remains inpatient appropriate because:Hemodynamically unstable and IV treatments appropriate due to  intensity of illness or inability to take PO   Dispo:  Patient From: Home  Planned Disposition: Home  Expected discharge date: 12/23/19  Medically stable for discharge: No       DVT Prophylaxis  :  Lovenox -   Lab Results  Component Value Date   PLT 315 12/16/2019    Diet :  Diet Order            Diet heart healthy/carb modified Room service appropriate? Yes; Fluid consistency: Thin  Diet effective now                  Inpatient Medications  Scheduled Meds: . vitamin C  500 mg Oral Daily  . aspirin EC  81 mg Oral QHS  . atorvastatin  40 mg Oral q1800  . carvedilol  3.125 mg Oral BID WC  . cholecalciferol  1,000 Units Oral Daily  . enoxaparin (LOVENOX) injection  60 mg Subcutaneous Q12H  . feeding supplement (GLUCERNA SHAKE)  237 mL Oral TID BM  . folic acid  1 mg Oral Daily  . insulin aspart  0-9 Units Subcutaneous TID WC  . Ipratropium-Albuterol  1 puff Inhalation Q6H  . methylPREDNISolone (SOLU-MEDROL) injection  60 mg Intravenous Q8H  . multivitamin with minerals  1 tablet Oral Daily  . pantoprazole  40 mg Oral Daily  . thiamine  100 mg Oral Daily  . zinc sulfate  220 mg Oral Daily   Continuous Infusions: . remdesivir 100 mg in NS 100 mL 100 mg (12/16/19 1034)   PRN Meds:.acetaminophen, chlorpheniramine-HYDROcodone  Antibiotics  :    Anti-infectives (From admission, onward)   Start     Dose/Rate Route Frequency Ordered Stop   12/15/19 1000  remdesivir 100 mg in sodium chloride 0.9 % 100 mL IVPB       "Followed by" Linked Group Details   100 mg 200 mL/hr over 30 Minutes Intravenous Daily 12/14/19 0002 12/19/19 0959   12/14/19 1000  remdesivir 100 mg in sodium chloride 0.9 % 100 mL IVPB  Status:  Discontinued       "Followed by" Linked Group Details   100 mg 200 mL/hr over 30 Minutes Intravenous Daily 12/13/19 2358 12/14/19 0001   12/14/19 0030  remdesivir 100 mg in sodium chloride 0.9 % 100 mL IVPB       "Followed by" Linked Group Details   100  mg 200 mL/hr over 30 Minutes Intravenous Every 30 min 12/14/19 0002 12/14/19 0210   12/14/19 0000  remdesivir 200 mg in sodium chloride 0.9% 250 mL IVPB  Status:  Discontinued       "Followed by" Linked Group Details   200 mg 580 mL/hr over 30 Minutes Intravenous Once 12/13/19 2358 12/14/19 0001   12/13/19 2245  vancomycin (VANCOREADY) IVPB 2000 mg/400 mL  2,000 mg 200 mL/hr over 120 Minutes Intravenous  Once 12/13/19 2241 12/14/19 0210   12/13/19 2245  ceFEPIme (MAXIPIME) 2 g in sodium chloride 0.9 % 100 mL IVPB        2 g 200 mL/hr over 30 Minutes Intravenous  Once 12/13/19 2241 12/14/19 0002         Zyquan Crotty M.D on 12/16/2019 at 12:08 PM   To page go to [email or web address]   [facility] -  Office  [phone]    Objective:   Vitals:   12/16/19 0258 12/16/19 0400 12/16/19 0742 12/16/19 0814  BP: (!) 97/52 (!) 96/51 (!) 102/59   Pulse: (!) 52 (!) 57 (!) 54 63  Resp: 17 19 18 18   Temp:  98.2 F (36.8 C) 97.6 F (36.4 C)   TempSrc:  Oral Oral   SpO2: 95% 99% 100% 100%  Weight:  111.9 kg    Height:        Wt Readings from Last 3 Encounters:  12/16/19 111.9 kg  09/10/19 114.8 kg  08/13/19 114.8 kg     Intake/Output Summary (Last 24 hours) at 12/16/2019 1208 Last data filed at 12/16/2019 0428 Gross per 24 hour  Intake 120 ml  Output 300 ml  Net -180 ml     Physical Exam  Awake Alert, Oriented X 3, No new F.N deficits, Normal affect Symmetrical Chest wall movement, Good air movement bilaterally, scattered rales RRR,No Gallops,Rubs or new Murmurs, No Parasternal Heave +ve B.Sounds, Abd Soft, No tenderness, No rebound - guarding or rigidity. No Cyanosis, Clubbing or edema, No new Rash or bruise        Data Review:    CBC Recent Labs  Lab 12/13/19 2147 12/13/19 2335 12/14/19 0543 12/15/19 0350 12/16/19 0708  WBC 4.9  --  4.9 3.4* 9.0  HGB 12.1* 10.5* 12.0* 11.5* 11.2*  HCT 37.5* 31.0* 38.1* 35.3* 34.4*  PLT 244  --  217 269 315   MCV 87.8  --  88.4 85.9 86.4  MCH 28.3  --  27.8 28.0 28.1  MCHC 32.3  --  31.5 32.6 32.6  RDW 14.7  --  14.7 14.7 14.7  LYMPHSABS  --   --  0.4* 0.2* 0.4*  MONOABS  --   --  0.2 0.1 0.3  EOSABS  --   --  0.1 0.0 0.0  BASOSABS  --   --  0.0 0.0 0.0    Recent Labs  Lab 12/13/19 2147 12/13/19 2335 12/14/19 0543 12/14/19 0545 12/14/19 0546 12/15/19 0350 12/16/19 0708  NA 139 140 140  --   --  139 140  K 4.2 3.8 4.1  --   --  4.1 4.4  CL 111  --  112*  --   --  114* 112*  CO2 18*  --  15*  --   --  15* 19*  GLUCOSE 124*  --  125*  --   --  214* 234*  BUN 50*  --  48*  --   --  43* 56*  CREATININE 1.32*  --  1.23  --   --  1.16 1.51*  CALCIUM 7.5*  --  7.3*  --   --  7.5* 7.6*  AST 86*  --  75*  --   --  47* 36  ALT 92*  --  83*  --   --  67* 59*  ALKPHOS 73  --  70  --   --  64 53  BILITOT 1.4*  --  1.5*  --   --  1.0 0.6  ALBUMIN 2.6*  --  2.4*  --   --  2.2* 2.4*  MG  --   --  3.6*  --   --  3.8* 3.5*  CRP  --   --  6.6*  --   --  6.3* 2.4*  DDIMER  --   --  3.17*  --   --  2.93* 1.95*  PROCALCITON  --   --  <0.10  --   --   --   --   LATICACIDVEN 1.4  --   --   --   --   --   --   TSH  --   --   --  0.056*  --   --   --   HGBA1C  --   --   --   --  6.1*  --   --   BNP 443.4*  --   --   --   --  667.8* 560.0*    ------------------------------------------------------------------------------------------------------------------ No results for input(s): CHOL, HDL, LDLCALC, TRIG, CHOLHDL, LDLDIRECT in the last 72 hours.  Lab Results  Component Value Date   HGBA1C 6.1 (H) 12/14/2019   ------------------------------------------------------------------------------------------------------------------ Recent Labs    12/14/19 0545  TSH 0.056*    Cardiac Enzymes No results for input(s): CKMB, TROPONINI, MYOGLOBIN in the last 168 hours.  Invalid input(s):  CK ------------------------------------------------------------------------------------------------------------------    Component Value Date/Time   BNP 560.0 (H) 12/16/2019 1884    Micro Results Recent Results (from the past 240 hour(s))  Respiratory Panel by RT PCR (Flu A&B, Covid) - Nasopharyngeal Swab     Status: Abnormal   Collection Time: 12/13/19  9:47 PM   Specimen: Nasopharyngeal Swab; Nasopharyngeal(NP) swabs in vial transport medium  Result Value Ref Range Status   SARS Coronavirus 2 by RT PCR POSITIVE (A) NEGATIVE Final    Comment: RESULT CALLED TO, READ BACK BY AND VERIFIED WITH: E SULLIVAN RN 12/13/19 2330 JDW (NOTE) SARS-CoV-2 target nucleic acids are DETECTED.  SARS-CoV-2 RNA is generally detectable in upper respiratory specimens  during the acute phase of infection. Positive results are indicative of the presence of the identified virus, but do not rule out bacterial infection or co-infection with other pathogens not detected by the test. Clinical correlation with patient history and other diagnostic information is necessary to determine patient infection status. The expected result is Negative.  Fact Sheet for Patients:  PinkCheek.be  Fact Sheet for Healthcare Providers: GravelBags.it  This test is not yet approved or cleared by the [place] FDA and  has been authorized for detection and/or diagnosis of SARS-CoV-2 by FDA under an Emergency Use Authorization (EUA).  This EUA will remain in effect (meaning this test can be use d) for the duration of  the COVID-19 declaration under Section 564(b)(1) of the Act, 21 U.S.C. section 360bbb-3(b)(1), unless the authorization is terminated or revoked sooner.      Influenza A by PCR NEGATIVE NEGATIVE Final   Influenza B by PCR NEGATIVE NEGATIVE Final    Comment: (NOTE) The Xpert Xpress SARS-CoV-2/FLU/RSV assay is intended as an aid in  the diagnosis of  influenza from Nasopharyngeal swab specimens and  should not be used as a sole basis for treatment. Nasal washings and  aspirates are unacceptable for Xpert Xpress SARS-CoV-2/FLU/RSV  testing.  Fact Sheet for Patients: PinkCheek.be  Fact Sheet for Healthcare Providers: GravelBags.it  This test is not yet approved or cleared by the [place]  States FDA and  has been authorized for detection and/or diagnosis of SARS-CoV-2 by  FDA under an Emergency Use Authorization (EUA). This EUA will remain  in effect (meaning this test can be used) for the duration of the  Covid-19 declaration under Section 564(b)(1) of the Act, 21  U.S.C. section 360bbb-3(b)(1), unless the authorization is  terminated or revoked. Performed at [facility], [place] [address], [place], [place] [address]   Blood culture (routine x 2)     Status: None (Preliminary result)   Collection Time: 12/13/19 10:45 PM   Specimen: BLOOD  Result Value Ref Range Status   Specimen Description BLOOD RIGHT ANTECUBITAL  Final   Special Requests   Final    BOTTLES DRAWN AEROBIC AND ANAEROBIC Blood Culture adequate volume   Culture   Final    NO GROWTH 3 DAYS Performed at [facility], [place] [address], [place], [place] [address]    Report Status PENDING  Incomplete  Blood culture (routine x 2)     Status: None (Preliminary result)   Collection Time: 12/13/19 11:27 PM   Specimen: BLOOD RIGHT HAND  Result Value Ref Range Status   Specimen Description BLOOD RIGHT HAND  Final   Special Requests   Final    BOTTLES DRAWN AEROBIC AND ANAEROBIC Blood Culture adequate volume   Culture   Final    NO GROWTH 2 DAYS Performed at [facility], [place] [address] [place], [place] [address]    Report Status PENDING  Incomplete    Radiology Reports DG Chest Portable 1 View  Result Date: 12/13/2019 CLINICAL DATA:  Shortness of breath, suspicion of COVID EXAM: PORTABLE  CHEST 1 VIEW COMPARISON:  Radiograph 01/07/2019, CT 11/20/2018 FINDINGS: Heterogeneous consolidative and hazy opacity is present in a lower lung and peripheral predominance most coalescent in the left lung periphery. Some more bandlike opacities, possibly subsegmental atelectatic changes or scarring. No pneumothorax. No visible effusion though the costophrenic sulci are partially collimated. Postsurgical changes related to prior CABG including intact and aligned sternotomy wires and multiple surgical clips projecting over the mediastinum. Telemetry leads overlie the chest. The aorta is calcified. The remaining cardiomediastinal contours are unremarkable. Degenerative changes are present in the imaged spine and shoulders. IMPRESSION: 1. Heterogeneous consolidative and hazy opacity in a lower lung and peripheral predominance most coalescent in the left lung periphery, compatible with a multifocal pneumonia including atypical viral etiology in the appropriate clinical setting. 2. More bandlike opacities likely reflect subsegmental atelectatic changes or scarring. Electronically Signed   By: Lovena Le M.D.   On: 12/13/2019 22:28   VAS [place] LOWER EXTREMITY VENOUS (DVT)  Result Date: 12/15/2019  Lower Venous DVT Study Indications: Elevated ddimer.  Comparison Study: 07/09/18 previous Performing Technologist: Abram Sander RVS  Examination Guidelines: A complete evaluation includes B-mode imaging, spectral Doppler, color Doppler, and power Doppler as needed of all accessible portions of each vessel. Bilateral testing is considered an integral part of a complete examination. Limited examinations for reoccurring indications may be performed as noted. The reflux portion of the exam is performed with the patient in reverse Trendelenburg.  +---------+---------------+---------+-----------+----------+--------------+ RIGHT    CompressibilityPhasicitySpontaneityPropertiesThrombus Aging  +---------+---------------+---------+-----------+----------+--------------+ CFV      Full           Yes      Yes                                 +---------+---------------+---------+-----------+----------+--------------+  SFJ      Full                                                        +---------+---------------+---------+-----------+----------+--------------+ FV Prox  Full                                                        +---------+---------------+---------+-----------+----------+--------------+ FV Mid   Full                                                        +---------+---------------+---------+-----------+----------+--------------+ FV DistalFull                                                        +---------+---------------+---------+-----------+----------+--------------+ PFV      Full                                                        +---------+---------------+---------+-----------+----------+--------------+ POP      Full           Yes      Yes                                 +---------+---------------+---------+-----------+----------+--------------+ PTV      Full                                                        +---------+---------------+---------+-----------+----------+--------------+ PERO     Full                                                        +---------+---------------+---------+-----------+----------+--------------+   +---------+---------------+---------+-----------+----------+--------------+ LEFT     CompressibilityPhasicitySpontaneityPropertiesThrombus Aging +---------+---------------+---------+-----------+----------+--------------+ CFV      Full           Yes      Yes                                 +---------+---------------+---------+-----------+----------+--------------+ SFJ      Full                                                         +---------+---------------+---------+-----------+----------+--------------+  FV Prox  Full                                                        +---------+---------------+---------+-----------+----------+--------------+ FV Mid   Full                                                        +---------+---------------+---------+-----------+----------+--------------+ FV DistalFull                                                        +---------+---------------+---------+-----------+----------+--------------+ PFV      Full                                                        +---------+---------------+---------+-----------+----------+--------------+ POP      Full           Yes      Yes                                 +---------+---------------+---------+-----------+----------+--------------+ PTV      Full                                                        +---------+---------------+---------+-----------+----------+--------------+ PERO     Full                                                        +---------+---------------+---------+-----------+----------+--------------+     Summary: BILATERAL: - No evidence of deep vein thrombosis seen in the lower extremities, bilaterally. - No evidence of superficial venous thrombosis in the lower extremities, bilaterally. -No evidence of popliteal cyst, bilaterally. RIGHT: - Findings appear essentially unchanged compared to previous examination.   *See table(s) above for measurements and observations. Electronically signed by Deitra Mayo MD on 12/15/2019 at 6:22:41 PM.    Final

## 2019-12-17 ENCOUNTER — Encounter (HOSPITAL_COMMUNITY): Payer: Self-pay | Admitting: Internal Medicine

## 2019-12-17 DIAGNOSIS — I255 Ischemic cardiomyopathy: Secondary | ICD-10-CM

## 2019-12-17 LAB — COMPREHENSIVE METABOLIC PANEL
ALT: 59 U/L — ABNORMAL HIGH (ref 0–44)
AST: 38 U/L (ref 15–41)
Albumin: 2.5 g/dL — ABNORMAL LOW (ref 3.5–5.0)
Alkaline Phosphatase: 55 U/L (ref 38–126)
Anion gap: 10 (ref 5–15)
BUN: 63 mg/dL — ABNORMAL HIGH (ref 8–23)
CO2: 18 mmol/L — ABNORMAL LOW (ref 22–32)
Calcium: 7.7 mg/dL — ABNORMAL LOW (ref 8.9–10.3)
Chloride: 112 mmol/L — ABNORMAL HIGH (ref 98–111)
Creatinine, Ser: 1.45 mg/dL — ABNORMAL HIGH (ref 0.61–1.24)
GFR, Estimated: 54 mL/min — ABNORMAL LOW (ref >60)
Glucose, Bld: 205 mg/dL — ABNORMAL HIGH (ref 70–99)
Potassium: 4.5 mmol/L (ref 3.5–5.1)
Sodium: 140 mmol/L (ref 135–145)
Total Bilirubin: 1 mg/dL (ref 0.3–1.2)
Total Protein: 6.2 g/dL — ABNORMAL LOW (ref 6.5–8.1)

## 2019-12-17 LAB — FERRITIN: Ferritin: 412 ng/mL — ABNORMAL HIGH (ref 24–336)

## 2019-12-17 LAB — CBC WITH DIFFERENTIAL/PLATELET
Abs Immature Granulocytes: 0.12 10*3/uL — ABNORMAL HIGH (ref 0.00–0.07)
Basophils Absolute: 0 10*3/uL (ref 0.0–0.1)
Basophils Relative: 0 %
Eosinophils Absolute: 0 10*3/uL (ref 0.0–0.5)
Eosinophils Relative: 0 %
HCT: 36 % — ABNORMAL LOW (ref 39.0–52.0)
Hemoglobin: 11.5 g/dL — ABNORMAL LOW (ref 13.0–17.0)
Immature Granulocytes: 1 %
Lymphocytes Relative: 4 %
Lymphs Abs: 0.4 10*3/uL — ABNORMAL LOW (ref 0.7–4.0)
MCH: 28 pg (ref 26.0–34.0)
MCHC: 31.9 g/dL (ref 30.0–36.0)
MCV: 87.6 fL (ref 80.0–100.0)
Monocytes Absolute: 0.4 10*3/uL (ref 0.1–1.0)
Monocytes Relative: 4 %
Neutro Abs: 8.6 10*3/uL — ABNORMAL HIGH (ref 1.7–7.7)
Neutrophils Relative %: 91 %
Platelets: 314 10*3/uL (ref 150–400)
RBC: 4.11 MIL/uL — ABNORMAL LOW (ref 4.22–5.81)
RDW: 14.8 % (ref 11.5–15.5)
WBC: 9.5 10*3/uL (ref 4.0–10.5)
nRBC: 0 % (ref 0.0–0.2)

## 2019-12-17 LAB — BRAIN NATRIURETIC PEPTIDE: B Natriuretic Peptide: 491.8 pg/mL — ABNORMAL HIGH (ref 0.0–100.0)

## 2019-12-17 LAB — GLUCOSE, CAPILLARY
Glucose-Capillary: 201 mg/dL — ABNORMAL HIGH (ref 70–99)
Glucose-Capillary: 213 mg/dL — ABNORMAL HIGH (ref 70–99)
Glucose-Capillary: 213 mg/dL — ABNORMAL HIGH (ref 70–99)
Glucose-Capillary: 197 mg/dL — ABNORMAL HIGH (ref 70–99)

## 2019-12-17 LAB — MAGNESIUM: Magnesium: 3.4 mg/dL — ABNORMAL HIGH (ref 1.7–2.4)

## 2019-12-17 LAB — PHOSPHORUS: Phosphorus: 4.5 mg/dL (ref 2.5–4.6)

## 2019-12-17 LAB — C-REACTIVE PROTEIN: CRP: 1.4 mg/dL — ABNORMAL HIGH (ref <1.0)

## 2019-12-17 LAB — D-DIMER, QUANTITATIVE: D-Dimer, Quant: 1.78 ug{FEU}/mL — ABNORMAL HIGH (ref 0.00–0.50)

## 2019-12-17 MED ORDER — INSULIN GLARGINE 100 UNIT/ML ~~LOC~~ SOLN
5.0000 [IU] | Freq: Once | SUBCUTANEOUS | Status: AC
  Administered 2019-12-17: 5 [IU] via SUBCUTANEOUS
  Filled 2019-12-17: qty 0.05

## 2019-12-17 MED ORDER — INSULIN GLARGINE 100 UNIT/ML ~~LOC~~ SOLN
10.0000 [IU] | Freq: Every day | SUBCUTANEOUS | Status: DC
  Administered 2019-12-18 – 2019-12-21 (×4): 10 [IU] via SUBCUTANEOUS
  Filled 2019-12-17 (×6): qty 0.1

## 2019-12-17 MED ORDER — CARVEDILOL 3.125 MG PO TABS
3.1250 mg | ORAL_TABLET | Freq: Two times a day (BID) | ORAL | Status: DC
  Administered 2019-12-18 – 2019-12-19 (×2): 3.125 mg via ORAL
  Filled 2019-12-17 (×3): qty 1

## 2019-12-17 NOTE — Progress Notes (Signed)
Inpatient Diabetes Program Recommendations  AACE/ADA: New Consensus Statement on Inpatient Glycemic Control (2015)  Target Ranges:  Prepandial:   less than 140 mg/dL      Peak postprandial:   less than 180 mg/dL (1-2 hours)      Critically ill patients:  140 - 180 mg/dL   Lab Results  Component Value Date   GLUCAP 201 (H) 12/17/2019   HGBA1C 6.1 (H) 12/14/2019    Review of Glycemic Control Results for [name], Frank R "RAY" (MRN [id number]) as of 12/17/2019 09:46  Ref. Range 12/16/2019 16:21 12/16/2019 20:54 12/17/2019 07:41  Glucose-Capillary Latest Ref Range: 70 - 99 mg/dL 221 (H) 216 (H) 201 (H)   Diabetes history: Type 2 DM Outpatient Diabetes medications: Farxiga 5 mg QAM Current orders for Inpatient glycemic control: Lantus 5 units QD, Novolog 0-9 units TID Solumedrol 60 mg Q8H  Inpatient Diabetes Program Recommendations:    Consider increasing Lantus to 5 units BID.   Thanks, Bronson Curb, MSN, RNC-OB Diabetes Coordinator [phone] (8a-5p)

## 2019-12-17 NOTE — Progress Notes (Signed)
Physical Therapy Treatment Patient Details Name: Frank Love MRN: [id number] DOB: March 21, 1955 Today's Date: 12/17/2019    History of Present Illness 64 y.o. male with medical history significant for chronic systolic CHF, ischemic cardiomyopathy with LVEF 35 to 40%, coronary artery disease status post CABG, penile cancer, carotid artery stenosis, who presented to [facility] via EMS after being found severely hypoxic at home. In the ED, he required nonrebreather and high flow nasal cannula to maintain O2 saturation greater than 92%.  Work-up in the ED revealed severe acute hypoxic respiratory failure secondary to COVID-19 viral pneumonia. Unvaccinated. Admitted 12/13/19    PT Comments    Patient up in recliner on [place] 80% FiO2 @ 25 L with sats 92%. Patient noted to have slow processing and difficulty explaining his discomfort with being in the chair (asking to go back to bed). Agreed to participate with PT and then reassess if he can sit up longer. During standing with pre-gait activities down to 85% after 90 seconds. Returned to sitting and returned to 88% after 75 sec. During UE light resistance exercises his saturations increased to 92%. While resting between exercises drops to 89%. Patient stating he felt better after exercises and agreed to stay up in chair.    Follow Up Recommendations  Home health PT;Supervision/Assistance - 24 hour;Supervision - Intermittent     Equipment Recommendations  Rolling walker with 5" wheels;3in1 (PT)    Recommendations for Other Services       Precautions / Restrictions Precautions Precautions: Other (comment) Precaution Comments: 25L [facility]; 80% FiO2    Mobility  Bed Mobility                  Transfers Overall transfer level: Needs assistance Equipment used: 1 person hand held assist Transfers: Sit to/from Stand Sit to Stand: Min assist            Ambulation/Gait             General Gait Details: unable due to [place]; pre-gait  lateral shifts progressing to small marching steps    Stairs             Wheelchair Mobility    Modified Rankin (Stroke Patients Only)       Balance Overall balance assessment: Needs assistance   Sitting balance-Leahy Scale: Good     Standing balance support: Bilateral upper extremity supported Standing balance-Leahy Scale: Poor                              Cognition Arousal/Alertness: Awake/alert Behavior During Therapy: Flat affect Overall Cognitive Status: Impaired/Different from baseline Area of Impairment: Attention;Safety/judgement;Awareness;Problem solving;Following commands                   Current Attention Level: Selective   Following Commands: Follows multi-step commands inconsistently   Awareness: Emergent Problem Solving: Slow processing;Difficulty sequencing;Requires verbal cues;Requires tactile cues General Comments: pt with great difficulty figuring out how to do theraband exercises for UEs with multimodal cues and demonstration by PT      Exercises Other Exercises Other Exercises: flutter valve x 5 Other Exercises: incentive spirometer x 5- pulling 1250 ml Other Exercises: pursed lip breathing as recovering from standing activities Other Exercises: educated on 4 UE theraband exercises with light orange band (shoulder horizontal abdct, D1/drawing sword; biceps curl; triceps extension) x 5 reps each; incr time as pt with difficulty understanding sequencing with band  Other Exercises: seated marching x 10;  seated knee extension x 5 each leg    General Comments General comments (skin integrity, edema, etc.): Patient initially asking to go back to bed. Agreed to work with PT and by end of session less uncomfortable and easily agreed to stay up in chair longer.       Pertinent Vitals/Pain Pain Assessment: Faces Faces Pain Scale: Hurts a little bit Pain Location: generalized discomfort from prolonged sitting Pain Descriptors /  Indicators: Discomfort Pain Intervention(s): Repositioned    Home Living                      Prior Function            PT Goals (current goals can now be found in the care plan section) Acute Rehab PT Goals Patient Stated Goal: to see his wife Time For Goal Achievement: 12/29/19 Potential to Achieve Goals: Fair Progress towards PT goals: Progressing toward goals    Frequency    Min 3X/week      PT Plan Current plan remains appropriate    Co-evaluation              AM-PAC PT "6 Clicks" Mobility   Outcome Measure  Help needed turning from your back to your side while in a flat bed without using bedrails?: A Little Help needed moving from lying on your back to sitting on the side of a flat bed without using bedrails?: A Little Help needed moving to and from a bed to a chair (including a wheelchair)?: A Little Help needed standing up from a chair using your arms (e.g., wheelchair or bedside chair)?: A Little Help needed to walk in hospital room?: A Lot Help needed climbing 3-5 steps with a railing? : Total 6 Click Score: 15    End of Session Equipment Utilized During Treatment: Oxygen Activity Tolerance: Patient limited by fatigue Patient left: in chair;with call bell/phone within reach Nurse Communication: Mobility status PT Visit Diagnosis: Other abnormalities of gait and mobility (R26.89);Muscle weakness (generalized) (M62.81);Difficulty in walking, not elsewhere classified (R26.2)     Time: 5366-4403 PT Time Calculation (min) (ACUTE ONLY): 21 min  Charges:  $Therapeutic Exercise: 8-22 mins                      [name], PT Pager [phone]    [name] 12/17/2019, 5:15 PM

## 2019-12-17 NOTE — Progress Notes (Signed)
PROGRESS NOTE                                                                             PROGRESS NOTE                                                                                                                                                                                                             Patient Demographics:    Frank Love, is a 64 y.o. male, DOB - [birth year], XQJ:[id number]  Outpatient Primary MD for the patient is [name], NP    LOS - 4  Admit date - 12/13/2019    Chief Complaint  Patient presents with  . Covid Exposure  . Shortness of Breath  . generalized weakness       Brief Narrative     64 y.o. male with medical history significant for chronic systolic CHF, ischemic cardiomyopathy with LVEF 35 to 40%, coronary artery disease status post CABG, penile cancer, carotid artery stenosis, who presented to [facility] via EMS after being found severely hypoxic at home.  Unfortunately patient is unvaccinated against Covid, his work-up was significant for COVID-19 of pneumonia and severe hypoxia for which he is admitted by [facility].    Subjective:    Frank Love today still reports dyspnea, cough, denies any chest pain, fever or chills.  .   Assessment  & Plan :    Active Problems:   Ischemic cardiomyopathy   S/P CABG x 4   Coronary artery disease   Acute hypoxemic respiratory failure due to COVID-19 (HCC)  Acute Hypoxic Resp. Failure due to Acute Covid 19 Viral Pneumonitis during the ongoing 2020 Covid 19 Pandemic  -Unfortunately patient is unvaccinated -Appears to be with severe COVID-19 pneumonia and lung injury, he is with significant oxygen requirement on heated high flow, this morning he is on 30 L heated high flow nasal cannula and NRB intermittently, I have lowered his flow rate to 25 L/min which she is tolerating very well. -Continue with IV steroids, has been changed to  IV Solu-Medrol -Received IV  remdesivir -Received Actemra 11/13/2019 -Continue to trend inflammatory markers, dimer segments elevated, continue with Lovenox 0.5 mg/kg every 12 hours, venous Dopplers with no evidence of DVT . Encouraged the patient to sit up in chair in the daytime use I-S and flutter valve for pulmonary toiletry and then prone in bed when at night.  Will advance activity and titrate down oxygen as possible.  Actemra/Baricitinib  off label use - patient was told that if COVID-19 pneumonitis gets worse we might potentially use Actemra off label, patient denies any known history of active diverticulitis, tuberculosis or hepatitis, understands the risks and benefits and wants to proceed with Actemra treatment if required.     SpO2: 95 % O2 Flow Rate (L/min): (S) 25 L/min FiO2 (%): (S) 80 %  Recent Labs  Lab 12/13/19 2147 12/14/19 0543 12/15/19 0350 12/16/19 0708 12/17/19 0145  WBC 4.9 4.9 3.4* 9.0 9.5  PLT 244 217 269 315 314  CRP  --  6.6* 6.3* 2.4* 1.4*  BNP 443.4*  --  667.8* 560.0* 491.8*  DDIMER  --  3.17* 2.93* 1.95* 1.78*  PROCALCITON  --  <0.10  --   --   --   AST 86* 75* 47* 36 38  ALT 92* 83* 67* 59* 59*  ALKPHOS 73 70 64 53 55  BILITOT 1.4* 1.5* 1.0 0.6 1.0  ALBUMIN 2.6* 2.4* 2.2* 2.4* 2.5*  LATICACIDVEN 1.4  --   --   --   --   SARSCOV2NAA POSITIVE*  --   --   --   --     FiO2 (%):  [80 %-90 %] 80 %  ABG     Component Value Date/Time   PHART 7.470 (H) 12/13/2019 2335   PCO2ART 22.4 (L) 12/13/2019 2335   PO2ART 118 (H) 12/13/2019 2335   HCO3 16.3 (L) 12/13/2019 2335   TCO2 17 (L) 12/13/2019 2335   ACIDBASEDEF 6.0 (H) 12/13/2019 2335   O2SAT 99.0 12/13/2019 2335    Chronic systolic CHF/CAD status post CABG Presented with elevated BNP 447 Euvolemic on exam Last 2D echo was on 04/18/2019 showing LVEF 35 to 40% -With soft blood pressure, and some bradycardia, so I will hold his digoxin, Entresto and Aldactone, Coreg has been stopped yesterday given soft blood pressure and  bradycardia, this has improved today, I will resume at a lower dose of 3.125 mg p.o. twice daily    Sinus bradycardia -Continue to hold junction, resumed on a lower dose Coreg  Coronary artery disease status post CABG Denies anginal symptoms at the time of this visit. Continue home aspirin and home statin  Essential hypertension, currently BPs are soft -Please see above discussion about medications  Transaminitis -due to Covid, monitor closely  Hyperglycemia A1c 6.1 Started high-dose IV steroids, continue with sliding scale, started on Lantus, remains elevated, will increase to 10 units daily.  CKD 2 Appears to be at his baseline creatinine 1.3 with GFR greater than 60 Avoid nephrotoxins  History of penile cancer/moderate protein calorie malnutrition      Condition - Extremely Guarded  Family Communication  : Wife updated who is hospitalized as well, as well daughter updated by phone daily.  Code Status :  Full  Consults  :  none  Procedures  :  none  PUD Prophylaxis : protonix  Disposition Plan  :    Status is: Inpatient  Remains inpatient appropriate because:Hemodynamically unstable and IV treatments appropriate due to intensity of illness or inability to  take PO   Dispo:  Patient From: Home  Planned Disposition: Home  Expected discharge date: 12/24/19  Medically stable for discharge: No       DVT Prophylaxis  :  Lovenox -   Lab Results  Component Value Date   PLT 314 12/17/2019    Diet :  Diet Order            Diet heart healthy/carb modified Room service appropriate? Yes; Fluid consistency: Thin  Diet effective now                  Inpatient Medications  Scheduled Meds: . vitamin C  500 mg Oral Daily  . aspirin EC  81 mg Oral QHS  . atorvastatin  40 mg Oral q1800  . cholecalciferol  1,000 Units Oral Daily  . enoxaparin (LOVENOX) injection  60 mg Subcutaneous Q12H  . feeding supplement (GLUCERNA SHAKE)  237 mL Oral TID BM   . folic acid  1 mg Oral Daily  . insulin aspart  0-9 Units Subcutaneous TID WC  . insulin glargine  5 Units Subcutaneous Daily  . Ipratropium-Albuterol  1 puff Inhalation BID  . methylPREDNISolone (SOLU-MEDROL) injection  60 mg Intravenous Q8H  . multivitamin with minerals  1 tablet Oral Daily  . pantoprazole  40 mg Oral Daily  . thiamine  100 mg Oral Daily  . zinc sulfate  220 mg Oral Daily   Continuous Infusions: . remdesivir 100 mg in NS 100 mL 100 mg (12/17/19 0953)   PRN Meds:.acetaminophen, chlorpheniramine-HYDROcodone  Antibiotics  :    Anti-infectives (From admission, onward)   Start     Dose/Rate Route Frequency Ordered Stop   12/15/19 1000  remdesivir 100 mg in sodium chloride 0.9 % 100 mL IVPB       "Followed by" Linked Group Details   100 mg 200 mL/hr over 30 Minutes Intravenous Daily 12/14/19 0002 12/19/19 0959   12/14/19 1000  remdesivir 100 mg in sodium chloride 0.9 % 100 mL IVPB  Status:  Discontinued       "Followed by" Linked Group Details   100 mg 200 mL/hr over 30 Minutes Intravenous Daily 12/13/19 2358 12/14/19 0001   12/14/19 0030  remdesivir 100 mg in sodium chloride 0.9 % 100 mL IVPB       "Followed by" Linked Group Details   100 mg 200 mL/hr over 30 Minutes Intravenous Every 30 min 12/14/19 0002 12/14/19 0210   12/14/19 0000  remdesivir 200 mg in sodium chloride 0.9% 250 mL IVPB  Status:  Discontinued       "Followed by" Linked Group Details   200 mg 580 mL/hr over 30 Minutes Intravenous Once 12/13/19 2358 12/14/19 0001   12/13/19 2245  vancomycin (VANCOREADY) IVPB 2000 mg/400 mL        2,000 mg 200 mL/hr over 120 Minutes Intravenous  Once 12/13/19 2241 12/14/19 0210   12/13/19 2245  ceFEPIme (MAXIPIME) 2 g in sodium chloride 0.9 % 100 mL IVPB        2 g 200 mL/hr over 30 Minutes Intravenous  Once 12/13/19 2241 12/14/19 0002         Kattie Santoyo M.D on 12/17/2019 at 11:24 AM   To page go to [email or web address]   [facility] -  Office   [phone]    Objective:   Vitals:   12/17/19 0145 12/17/19 0415 12/17/19 0752 12/17/19 0903  BP:  112/66 (!) 106/57 100/64  Pulse:  65 (!) 54 (!) 57  Resp:  (!) 22 17 20   Temp:  97.6 F (36.4 C) 97.7 F (36.5 C)   TempSrc:  Axillary Oral   SpO2: 93% 98% 99% 95%  Weight:  111 kg    Height:        Wt Readings from Last 3 Encounters:  12/17/19 111 kg  09/10/19 114.8 kg  08/13/19 114.8 kg     Intake/Output Summary (Last 24 hours) at 12/17/2019 1124 Last data filed at 12/17/2019 1000 Gross per 24 hour  Intake 600 ml  Output 1025 ml  Net -425 ml     Physical Exam  Awake Alert, Oriented X 3, No new F.N deficits, Normal affect Symmetrical Chest wall movement, Good air movement bilaterally, scattered rales RRR,No Gallops,Rubs or new Murmurs, No Parasternal Heave +ve B.Sounds, Abd Soft, No tenderness, No rebound - guarding or rigidity. No Cyanosis, Clubbing or edema, No new Rash or bruise          Data Review:    CBC Recent Labs  Lab 12/13/19 2147 12/13/19 2147 12/13/19 2335 12/14/19 0543 12/15/19 0350 12/16/19 0708 12/17/19 0145  WBC 4.9  --   --  4.9 3.4* 9.0 9.5  HGB 12.1*   < > 10.5* 12.0* 11.5* 11.2* 11.5*  HCT 37.5*   < > 31.0* 38.1* 35.3* 34.4* 36.0*  PLT 244  --   --  217 269 315 314  MCV 87.8  --   --  88.4 85.9 86.4 87.6  MCH 28.3  --   --  27.8 28.0 28.1 28.0  MCHC 32.3  --   --  31.5 32.6 32.6 31.9  RDW 14.7  --   --  14.7 14.7 14.7 14.8  LYMPHSABS  --   --   --  0.4* 0.2* 0.4* 0.4*  MONOABS  --   --   --  0.2 0.1 0.3 0.4  EOSABS  --   --   --  0.1 0.0 0.0 0.0  BASOSABS  --   --   --  0.0 0.0 0.0 0.0   < > = values in this interval not displayed.    Recent Labs  Lab 12/13/19 2147 12/13/19 2147 12/13/19 2335 12/14/19 0543 12/14/19 0545 12/14/19 0546 12/15/19 0350 12/16/19 0708 12/17/19 0145  NA 139   < > 140 140  --   --  139 140 140  K 4.2   < > 3.8 4.1  --   --  4.1 4.4 4.5  CL 111  --   --  112*  --   --  114* 112* 112*   CO2 18*  --   --  15*  --   --  15* 19* 18*  GLUCOSE 124*  --   --  125*  --   --  214* 234* 205*  BUN 50*  --   --  48*  --   --  43* 56* 63*  CREATININE 1.32*  --   --  1.23  --   --  1.16 1.51* 1.45*  CALCIUM 7.5*  --   --  7.3*  --   --  7.5* 7.6* 7.7*  AST 86*  --   --  75*  --   --  47* 36 38  ALT 92*  --   --  83*  --   --  67* 59* 59*  ALKPHOS 73  --   --  70  --   --  64 53 55  BILITOT 1.4*  --   --  1.5*  --   --  1.0 0.6 1.0  ALBUMIN 2.6*  --   --  2.4*  --   --  2.2* 2.4* 2.5*  MG  --   --   --  3.6*  --   --  3.8* 3.5* 3.4*  CRP  --   --   --  6.6*  --   --  6.3* 2.4* 1.4*  DDIMER  --   --   --  3.17*  --   --  2.93* 1.95* 1.78*  PROCALCITON  --   --   --  <0.10  --   --   --   --   --   LATICACIDVEN 1.4  --   --   --   --   --   --   --   --   TSH  --   --   --   --  0.056*  --   --   --   --   HGBA1C  --   --   --   --   --  6.1*  --   --   --   BNP 443.4*  --   --   --   --   --  667.8* 560.0* 491.8*   < > = values in this interval not displayed.    ------------------------------------------------------------------------------------------------------------------ No results for input(s): CHOL, HDL, LDLCALC, TRIG, CHOLHDL, LDLDIRECT in the last 72 hours.  Lab Results  Component Value Date   HGBA1C 6.1 (H) 12/14/2019   ------------------------------------------------------------------------------------------------------------------ No results for input(s): TSH, T4TOTAL, T3FREE, THYROIDAB in the last 72 hours.  Invalid input(s): FREET3  Cardiac Enzymes No results for input(s): CKMB, TROPONINI, MYOGLOBIN in the last 168 hours.  Invalid input(s): CK ------------------------------------------------------------------------------------------------------------------    Component Value Date/Time   BNP 491.8 (H) 12/17/2019 0145    Micro Results Recent Results (from the past 240 hour(s))  Respiratory Panel by RT PCR (Flu A&B, Covid) - Nasopharyngeal Swab     Status:  Abnormal   Collection Time: 12/13/19  9:47 PM   Specimen: Nasopharyngeal Swab; Nasopharyngeal(NP) swabs in vial transport medium  Result Value Ref Range Status   SARS Coronavirus 2 by RT PCR POSITIVE (A) NEGATIVE Final    Comment: RESULT CALLED TO, READ BACK BY AND VERIFIED WITH: E SULLIVAN RN 12/13/19 2330 JDW (NOTE) SARS-CoV-2 target nucleic acids are DETECTED.  SARS-CoV-2 RNA is generally detectable in upper respiratory specimens  during the acute phase of infection. Positive results are indicative of the presence of the identified virus, but do not rule out bacterial infection or co-infection with other pathogens not detected by the test. Clinical correlation with patient history and other diagnostic information is necessary to determine patient infection status. The expected result is Negative.  Fact Sheet for Patients:  PinkCheek.be  Fact Sheet for Healthcare Providers: GravelBags.it  This test is not yet approved or cleared by the [place] FDA and  has been authorized for detection and/or diagnosis of SARS-CoV-2 by FDA under an Emergency Use Authorization (EUA).  This EUA will remain in effect (meaning this test can be use d) for the duration of  the COVID-19 declaration under Section 564(b)(1) of the Act, 21 U.S.C. section 360bbb-3(b)(1), unless the authorization is terminated or revoked sooner.      Influenza A by PCR NEGATIVE NEGATIVE Final   Influenza B by PCR NEGATIVE NEGATIVE Final    Comment: (NOTE) The Xpert Xpress SARS-CoV-2/FLU/RSV assay is intended as an aid  in  the diagnosis of influenza from Nasopharyngeal swab specimens and  should not be used as a sole basis for treatment. Nasal washings and  aspirates are unacceptable for Xpert Xpress SARS-CoV-2/FLU/RSV  testing.  Fact Sheet for Patients: PinkCheek.be  Fact Sheet for Healthcare  Providers: GravelBags.it  This test is not yet approved or cleared by the [place] FDA and  has been authorized for detection and/or diagnosis of SARS-CoV-2 by  FDA under an Emergency Use Authorization (EUA). This EUA will remain  in effect (meaning this test can be used) for the duration of the  Covid-19 declaration under Section 564(b)(1) of the Act, 21  U.S.C. section 360bbb-3(b)(1), unless the authorization is  terminated or revoked. Performed at [facility], [place] [address] [place], [place] [address]   Blood culture (routine x 2)     Status: None (Preliminary result)   Collection Time: 12/13/19 10:45 PM   Specimen: BLOOD  Result Value Ref Range Status   Specimen Description BLOOD RIGHT ANTECUBITAL  Final   Special Requests   Final    BOTTLES DRAWN AEROBIC AND ANAEROBIC Blood Culture adequate volume   Culture   Final    NO GROWTH 4 DAYS Performed at [facility], [place] [address], [place], [place] [address]    Report Status PENDING  Incomplete  Blood culture (routine x 2)     Status: None (Preliminary result)   Collection Time: 12/13/19 11:27 PM   Specimen: BLOOD RIGHT HAND  Result Value Ref Range Status   Specimen Description BLOOD RIGHT HAND  Final   Special Requests   Final    BOTTLES DRAWN AEROBIC AND ANAEROBIC Blood Culture adequate volume   Culture   Final    NO GROWTH 3 DAYS Performed at [facility], [place] [address] [place], [place] [address]    Report Status PENDING  Incomplete    Radiology Reports DG Chest Portable 1 View  Result Date: 12/13/2019 CLINICAL DATA:  Shortness of breath, suspicion of COVID EXAM: PORTABLE CHEST 1 VIEW COMPARISON:  Radiograph 01/07/2019, CT 11/20/2018 FINDINGS: Heterogeneous consolidative and hazy opacity is present in a lower lung and peripheral predominance most coalescent in the left lung periphery. Some more bandlike opacities, possibly subsegmental atelectatic changes or  scarring. No pneumothorax. No visible effusion though the costophrenic sulci are partially collimated. Postsurgical changes related to prior CABG including intact and aligned sternotomy wires and multiple surgical clips projecting over the mediastinum. Telemetry leads overlie the chest. The aorta is calcified. The remaining cardiomediastinal contours are unremarkable. Degenerative changes are present in the imaged spine and shoulders. IMPRESSION: 1. Heterogeneous consolidative and hazy opacity in a lower lung and peripheral predominance most coalescent in the left lung periphery, compatible with a multifocal pneumonia including atypical viral etiology in the appropriate clinical setting. 2. More bandlike opacities likely reflect subsegmental atelectatic changes or scarring. Electronically Signed   By: Lovena Le M.D.   On: 12/13/2019 22:28   VAS [place] LOWER EXTREMITY VENOUS (DVT)  Result Date: 12/15/2019  Lower Venous DVT Study Indications: Elevated ddimer.  Comparison Study: 07/09/18 previous Performing Technologist: Abram Sander RVS  Examination Guidelines: A complete evaluation includes B-mode imaging, spectral Doppler, color Doppler, and power Doppler as needed of all accessible portions of each vessel. Bilateral testing is considered an integral part of a complete examination. Limited examinations for reoccurring indications may be performed as noted. The reflux portion of the exam is performed with the patient in reverse Trendelenburg.  +---------+---------------+---------+-----------+----------+--------------+  RIGHT    CompressibilityPhasicitySpontaneityPropertiesThrombus Aging +---------+---------------+---------+-----------+----------+--------------+ CFV      Full           Yes      Yes                                 +---------+---------------+---------+-----------+----------+--------------+ SFJ      Full                                                         +---------+---------------+---------+-----------+----------+--------------+ FV Prox  Full                                                        +---------+---------------+---------+-----------+----------+--------------+ FV Mid   Full                                                        +---------+---------------+---------+-----------+----------+--------------+ FV DistalFull                                                        +---------+---------------+---------+-----------+----------+--------------+ PFV      Full                                                        +---------+---------------+---------+-----------+----------+--------------+ POP      Full           Yes      Yes                                 +---------+---------------+---------+-----------+----------+--------------+ PTV      Full                                                        +---------+---------------+---------+-----------+----------+--------------+ PERO     Full                                                        +---------+---------------+---------+-----------+----------+--------------+   +---------+---------------+---------+-----------+----------+--------------+ LEFT     CompressibilityPhasicitySpontaneityPropertiesThrombus Aging +---------+---------------+---------+-----------+----------+--------------+ CFV      Full           Yes      Yes                                 +---------+---------------+---------+-----------+----------+--------------+  SFJ      Full                                                        +---------+---------------+---------+-----------+----------+--------------+ FV Prox  Full                                                        +---------+---------------+---------+-----------+----------+--------------+ FV Mid   Full                                                         +---------+---------------+---------+-----------+----------+--------------+ FV DistalFull                                                        +---------+---------------+---------+-----------+----------+--------------+ PFV      Full                                                        +---------+---------------+---------+-----------+----------+--------------+ POP      Full           Yes      Yes                                 +---------+---------------+---------+-----------+----------+--------------+ PTV      Full                                                        +---------+---------------+---------+-----------+----------+--------------+ PERO     Full                                                        +---------+---------------+---------+-----------+----------+--------------+     Summary: BILATERAL: - No evidence of deep vein thrombosis seen in the lower extremities, bilaterally. - No evidence of superficial venous thrombosis in the lower extremities, bilaterally. -No evidence of popliteal cyst, bilaterally. RIGHT: - Findings appear essentially unchanged compared to previous examination.   *See table(s) above for measurements and observations. Electronically signed by Deitra Mayo MD on 12/15/2019 at 6:22:41 PM.    Final

## 2019-12-18 LAB — COMPREHENSIVE METABOLIC PANEL
ALT: 55 U/L — ABNORMAL HIGH (ref 0–44)
AST: 37 U/L (ref 15–41)
Albumin: 2.4 g/dL — ABNORMAL LOW (ref 3.5–5.0)
Alkaline Phosphatase: 58 U/L (ref 38–126)
Anion gap: 11 (ref 5–15)
BUN: 65 mg/dL — ABNORMAL HIGH (ref 8–23)
CO2: 18 mmol/L — ABNORMAL LOW (ref 22–32)
Calcium: 7.7 mg/dL — ABNORMAL LOW (ref 8.9–10.3)
Chloride: 109 mmol/L (ref 98–111)
Creatinine, Ser: 1.26 mg/dL — ABNORMAL HIGH (ref 0.61–1.24)
GFR, Estimated: >60 mL/min (ref >60)
Glucose, Bld: 224 mg/dL — ABNORMAL HIGH (ref 70–99)
Potassium: 4.7 mmol/L (ref 3.5–5.1)
Sodium: 138 mmol/L (ref 135–145)
Total Bilirubin: 0.7 mg/dL (ref 0.3–1.2)
Total Protein: 5.7 g/dL — ABNORMAL LOW (ref 6.5–8.1)

## 2019-12-18 LAB — CULTURE, BLOOD (ROUTINE X 2)
Culture: NO GROWTH
Special Requests: ADEQUATE

## 2019-12-18 LAB — CBC WITH DIFFERENTIAL/PLATELET
Abs Immature Granulocytes: 0.24 10*3/uL — ABNORMAL HIGH (ref 0.00–0.07)
Basophils Absolute: 0 10*3/uL (ref 0.0–0.1)
Basophils Relative: 0 %
Eosinophils Absolute: 0 10*3/uL (ref 0.0–0.5)
Eosinophils Relative: 0 %
HCT: 35.3 % — ABNORMAL LOW (ref 39.0–52.0)
Hemoglobin: 11.2 g/dL — ABNORMAL LOW (ref 13.0–17.0)
Immature Granulocytes: 3 %
Lymphocytes Relative: 4 %
Lymphs Abs: 0.4 10*3/uL — ABNORMAL LOW (ref 0.7–4.0)
MCH: 27.7 pg (ref 26.0–34.0)
MCHC: 31.7 g/dL (ref 30.0–36.0)
MCV: 87.2 fL (ref 80.0–100.0)
Monocytes Absolute: 0.3 10*3/uL (ref 0.1–1.0)
Monocytes Relative: 4 %
Neutro Abs: 7.4 10*3/uL (ref 1.7–7.7)
Neutrophils Relative %: 89 %
Platelets: 283 10*3/uL (ref 150–400)
RBC: 4.05 MIL/uL — ABNORMAL LOW (ref 4.22–5.81)
RDW: 14.8 % (ref 11.5–15.5)
WBC: 8.3 10*3/uL (ref 4.0–10.5)
nRBC: 0 % (ref 0.0–0.2)

## 2019-12-18 LAB — GLUCOSE, CAPILLARY
Glucose-Capillary: 215 mg/dL — ABNORMAL HIGH (ref 70–99)
Glucose-Capillary: 214 mg/dL — ABNORMAL HIGH (ref 70–99)
Glucose-Capillary: 194 mg/dL — ABNORMAL HIGH (ref 70–99)
Glucose-Capillary: 235 mg/dL — ABNORMAL HIGH (ref 70–99)

## 2019-12-18 LAB — PHOSPHORUS: Phosphorus: 3.8 mg/dL (ref 2.5–4.6)

## 2019-12-18 LAB — MAGNESIUM: Magnesium: 3.6 mg/dL — ABNORMAL HIGH (ref 1.7–2.4)

## 2019-12-18 LAB — FERRITIN: Ferritin: 434 ng/mL — ABNORMAL HIGH (ref 24–336)

## 2019-12-18 LAB — D-DIMER, QUANTITATIVE: D-Dimer, Quant: 1.51 ug/mL-FEU — ABNORMAL HIGH (ref 0.00–0.50)

## 2019-12-18 LAB — BRAIN NATRIURETIC PEPTIDE: B Natriuretic Peptide: 347.8 pg/mL — ABNORMAL HIGH (ref 0.0–100.0)

## 2019-12-18 LAB — C-REACTIVE PROTEIN: CRP: 1 mg/dL — ABNORMAL HIGH (ref <1.0)

## 2019-12-18 MED ORDER — ENOXAPARIN SODIUM 60 MG/0.6ML ~~LOC~~ SOLN
60.0000 mg | SUBCUTANEOUS | Status: DC
  Administered 2019-12-19: 60 mg via SUBCUTANEOUS
  Filled 2019-12-18: qty 0.6

## 2019-12-18 NOTE — Progress Notes (Signed)
PROGRESS NOTE    Frank Love  AYT:[id number] DOB: 1955/02/01 DOA: 12/13/2019 PCP: [name], NP   Brief Narrative:64 y.o.malewith medical history significant forchronic systolic CHF, ischemic cardiomyopathy with LVEF 35 to 40%, coronary artery disease status post CABG, penile cancer, carotid artery stenosis, who presented to [facility] via EMS after being found severely hypoxicathome.  Unfortunately patient is unvaccinated against Covid, his work-up was significant for COVID-19 of pneumonia and severe hypoxia for which he is admitted by [facility].  Assessment & Plan:   Active Problems:   Ischemic cardiomyopathy   S/P CABG x 4   Coronary artery disease   Acute hypoxemic respiratory failure due to COVID-19 [facility])  #1 acute hypoxic respiratory failure due to Covid pneumonia-patient is still requiring high flow nasal cannula at and nonrebreather mask intermittently.  He received remdesivir and he received Actemra 11/13/2019. Continue IV Solu-Medrol. Trend inflammatory markers. Encourage incentive spirometry flutter valve and out of bed. Encourage prone in bed at night. His oxygen requirement slightly better than yesterday 99% on 20 L high flow nasal cannula.  Actemra/Baricitinib  off label use - patient was told that if COVID-19 pneumonitis gets worse we might potentially use Actemra off label, patient denies any known history of active diverticulitis, tuberculosis or hepatitis, understands the risks and benefits and wants to proceed with Actemra treatment if required.   #2 chronic systolic heart failure last echo in March 2021 with ejection fraction 35 to 40%.  Patient has had some episodes of bradycardia.  He was on digoxin Entresto and Aldactone and Coreg at home.  These have been on hold due to soft  blood pressure and bradycardia. Coreg was reordered 12/17/2019 however he still remains hypotensive. Blood pressure 96/53 with a heart rate between 58 and 60.  #3 steroid-induced  hyperglycemia hemoglobin A1c 6.1 he has no history of type 2 diabetes.  Lantus started 10 units daily. CBG (last 3)  Recent Labs    12/17/19 1700 12/17/19 2113 12/18/19 0730  GLUCAP 213* 197* 215*     #4 history of type CKD type II stable-creatinine trending down.  #5 history of CAD and CABG continue statin and home aspirin.  #6 history of essential hypertension currently blood pressure soft holding home meds.  Estimated body mass index is 32.27 kg/m as calculated from the following:   Height as of this encounter: 6\' 2"  (1.88 m).   Weight as of this encounter: 114 kg.  DVT prophylaxis: Lovenox twice a day  code Status: Full code Family Communication: Discussed with wife Disposition Plan:  Status is: Inpatient   Dispo:  Patient From: Home  planned Disposition: Home  expected discharge date within 3 days  medically stable for discharge: No   Consultants:   None  Procedures: None Antimicrobials: Anti-infectives (From admission, onward)   Start     Dose/Rate Route Frequency Ordered Stop   12/15/19 1000  remdesivir 100 mg in sodium chloride 0.9 % 100 mL IVPB       "Followed by" Linked Group Details   100 mg 200 mL/hr over 30 Minutes Intravenous Daily 12/14/19 0002 12/18/19 0849   12/14/19 1000  remdesivir 100 mg in sodium chloride 0.9 % 100 mL IVPB  Status:  Discontinued       "Followed by" Linked Group Details   100 mg 200 mL/hr over 30 Minutes Intravenous Daily 12/13/19 2358 12/14/19 0001   12/14/19 0030  remdesivir 100 mg in sodium chloride 0.9 % 100 mL IVPB       "  Followed by" Linked Group Details   100 mg 200 mL/hr over 30 Minutes Intravenous Every 30 min 12/14/19 0002 12/14/19 0210   12/14/19 0000  remdesivir 200 mg in sodium chloride 0.9% 250 mL IVPB  Status:  Discontinued       "Followed by" Linked Group Details   200 mg 580 mL/hr over 30 Minutes Intravenous Once 12/13/19 2358 12/14/19 0001   12/13/19 2245  vancomycin (VANCOREADY) IVPB 2000 mg/400 mL         2,000 mg 200 mL/hr over 120 Minutes Intravenous  Once 12/13/19 2241 12/14/19 0210   12/13/19 2245  ceFEPIme (MAXIPIME) 2 g in sodium chloride 0.9 % 100 mL IVPB        2 g 200 mL/hr over 30 Minutes Intravenous  Once 12/13/19 2241 12/14/19 0002       Subjective: Patient sitting up in chair eating breakfast feeling better than yesterday however still remains hypoxic on 20 L of high flow nasal cannula.  Objective: Vitals:   12/18/19 0355 12/18/19 0400 12/18/19 0412 12/18/19 0726  BP:  (!) 105/57  (!) 96/53  Pulse: 63 (!) 57  60  Resp: 20 17  17   Temp:  97.6 F (36.4 C)  97.9 F (36.6 C)  TempSrc:  Oral  Oral  SpO2: 99% 99%  99%  Weight:   114 kg   Height:        Intake/Output Summary (Last 24 hours) at 12/18/2019 1100 Last data filed at 12/18/2019 0430 Gross per 24 hour  Intake 240 ml  Output 800 ml  Net -560 ml   Filed Weights   12/16/19 0400 12/17/19 0415 12/18/19 0412  Weight: 111.9 kg 111 kg 114 kg    Examination:  General exam: Appears calm and comfortable  Respiratory system: Coarse breath sounds to auscultation. Respiratory effort normal. Cardiovascular system: S1 & S2 heard, RRR. No JVD, murmurs, rubs, gallops or clicks. No pedal edema. Gastrointestinal system: Abdomen is nondistended, soft and nontender. No organomegaly or masses felt. Normal bowel sounds heard. Central nervous system: Alert and oriented. No focal neurological deficits. Extremities: Symmetric 5 x 5 power. Skin: No rashes, lesions or ulcers Psychiatry: Judgement and insight appear normal. Mood & affect appropriate.     Data Reviewed: I have personally reviewed following labs and imaging studies  CBC: Recent Labs  Lab 12/14/19 0543 12/15/19 0350 12/16/19 0708 12/17/19 0145 12/18/19 0040  WBC 4.9 3.4* 9.0 9.5 8.3  NEUTROABS 4.0 3.1 8.1* 8.6* 7.4  HGB 12.0* 11.5* 11.2* 11.5* 11.2*  HCT 38.1* 35.3* 34.4* 36.0* 35.3*  MCV 88.4 85.9 86.4 87.6 87.2  PLT 217 269 315 314 462   Basic  Metabolic Panel: Recent Labs  Lab 12/14/19 0543 12/15/19 0350 12/16/19 0708 12/17/19 0145 12/18/19 0040  NA 140 139 140 140 138  K 4.1 4.1 4.4 4.5 4.7  CL 112* 114* 112* 112* 109  CO2 15* 15* 19* 18* 18*  GLUCOSE 125* 214* 234* 205* 224*  BUN 48* 43* 56* 63* 65*  CREATININE 1.23 1.16 1.51* 1.45* 1.26*  CALCIUM 7.3* 7.5* 7.6* 7.7* 7.7*  MG 3.6* 3.8* 3.5* 3.4* 3.6*  PHOS 4.1 5.1* 4.2 4.5 3.8   GFR: Estimated Creatinine Clearance: 79.5 mL/min (A) (by C-G formula based on SCr of 1.26 mg/dL (H)). Liver Function Tests: Recent Labs  Lab 12/14/19 0543 12/15/19 0350 12/16/19 0708 12/17/19 0145 12/18/19 0040  AST 75* 47* 36 38 37  ALT 83* 67* 59* 59* 55*  ALKPHOS 70 64 53 55 58  BILITOT 1.5* 1.0 0.6 1.0 0.7  PROT 6.3* 6.4* 6.1* 6.2* 5.7*  ALBUMIN 2.4* 2.2* 2.4* 2.5* 2.4*   Recent Labs  Lab 12/13/19 2147  LIPASE 54*   No results for input(s): AMMONIA in the last 168 hours. Coagulation Profile: No results for input(s): INR, PROTIME in the last 168 hours. Cardiac Enzymes: No results for input(s): CKTOTAL, CKMB, CKMBINDEX, TROPONINI in the last 168 hours. BNP (last 3 results) No results for input(s): PROBNP in the last 8760 hours. HbA1C: No results for input(s): HGBA1C in the last 72 hours. CBG: Recent Labs  Lab 12/17/19 0741 12/17/19 1159 12/17/19 1700 12/17/19 2113 12/18/19 0730  GLUCAP 201* 213* 213* 197* 215*   Lipid Profile: No results for input(s): CHOL, HDL, LDLCALC, TRIG, CHOLHDL, LDLDIRECT in the last 72 hours. Thyroid Function Tests: No results for input(s): TSH, T4TOTAL, FREET4, T3FREE, THYROIDAB in the last 72 hours. Anemia Panel: Recent Labs    12/17/19 0145 12/18/19 0040  FERRITIN 412* 434*   Sepsis Labs: Recent Labs  Lab 12/13/19 2147 12/14/19 0543  PROCALCITON  --  <0.10  LATICACIDVEN 1.4  --     Recent Results (from the past 240 hour(s))  Respiratory Panel by RT PCR (Flu A&B, Covid) - Nasopharyngeal Swab     Status: Abnormal    Collection Time: 12/13/19  9:47 PM   Specimen: Nasopharyngeal Swab; Nasopharyngeal(NP) swabs in vial transport medium  Result Value Ref Range Status   SARS Coronavirus 2 by RT PCR POSITIVE (A) NEGATIVE Final    Comment: RESULT CALLED TO, READ BACK BY AND VERIFIED WITH: E SULLIVAN RN 12/13/19 2330 JDW (NOTE) SARS-CoV-2 target nucleic acids are DETECTED.  SARS-CoV-2 RNA is generally detectable in upper respiratory specimens  during the acute phase of infection. Positive results are indicative of the presence of the identified virus, but do not rule out bacterial infection or co-infection with other pathogens not detected by the test. Clinical correlation with patient history and other diagnostic information is necessary to determine patient infection status. The expected result is Negative.  Fact Sheet for Patients:  PinkCheek.be  Fact Sheet for Healthcare Providers: GravelBags.it  This test is not yet approved or cleared by the [place] FDA and  has been authorized for detection and/or diagnosis of SARS-CoV-2 by FDA under an Emergency Use Authorization (EUA).  This EUA will remain in effect (meaning this test can be use d) for the duration of  the COVID-19 declaration under Section 564(b)(1) of the Act, 21 U.S.C. section 360bbb-3(b)(1), unless the authorization is terminated or revoked sooner.      Influenza A by PCR NEGATIVE NEGATIVE Final   Influenza B by PCR NEGATIVE NEGATIVE Final    Comment: (NOTE) The Xpert Xpress SARS-CoV-2/FLU/RSV assay is intended as an aid in  the diagnosis of influenza from Nasopharyngeal swab specimens and  should not be used as a sole basis for treatment. Nasal washings and  aspirates are unacceptable for Xpert Xpress SARS-CoV-2/FLU/RSV  testing.  Fact Sheet for Patients: PinkCheek.be  Fact Sheet for Healthcare  Providers: GravelBags.it  This test is not yet approved or cleared by the [place] FDA and  has been authorized for detection and/or diagnosis of SARS-CoV-2 by  FDA under an Emergency Use Authorization (EUA). This EUA will remain  in effect (meaning this test can be used) for the duration of the  Covid-19 declaration under Section 564(b)(1) of the Act, 21  U.S.C. section 360bbb-3(b)(1), unless the authorization is  terminated or revoked. Performed at [facility], [place] [address] [place], [place] [address]   Blood culture (routine x 2)     Status: None   Collection Time: 12/13/19 10:45 PM   Specimen: BLOOD  Result Value Ref Range Status   Specimen Description BLOOD RIGHT ANTECUBITAL  Final   Special Requests   Final    BOTTLES DRAWN AEROBIC AND ANAEROBIC Blood Culture adequate volume   Culture   Final    NO GROWTH 5 DAYS Performed at [facility], [place] [address], [place], [place] [address]    Report Status 12/18/2019 FINAL  Final  Blood culture (routine x 2)     Status: None (Preliminary result)   Collection Time: 12/13/19 11:27 PM   Specimen: BLOOD RIGHT HAND  Result Value Ref Range Status   Specimen Description BLOOD RIGHT HAND  Final   Special Requests   Final    BOTTLES DRAWN AEROBIC AND ANAEROBIC Blood Culture adequate volume   Culture   Final    NO GROWTH 4 DAYS Performed at [facility], [place] [address], [place], [place] [address]    Report Status PENDING  Incomplete         Radiology Studies: No results found.      Scheduled Meds: . vitamin C  500 mg Oral Daily  . aspirin EC  81 mg Oral QHS  . atorvastatin  40 mg Oral q1800  . carvedilol  3.125 mg Oral BID WC  . cholecalciferol  1,000 Units Oral Daily  . enoxaparin (LOVENOX) injection  60 mg Subcutaneous Q12H  . feeding supplement (GLUCERNA SHAKE)  237 mL Oral TID BM  . folic acid  1 mg Oral Daily  . insulin aspart  0-9 Units Subcutaneous TID WC  .  insulin glargine  10 Units Subcutaneous Daily  . Ipratropium-Albuterol  1 puff Inhalation BID  . methylPREDNISolone (SOLU-MEDROL) injection  60 mg Intravenous Q8H  . multivitamin with minerals  1 tablet Oral Daily  . pantoprazole  40 mg Oral Daily  . thiamine  100 mg Oral Daily  . zinc sulfate  220 mg Oral Daily   Continuous Infusions:   LOS: 5 days     Georgette Shell, MD  12/18/2019, 11:00 AM

## 2019-12-18 NOTE — Progress Notes (Signed)
Physical Therapy Treatment Patient Details Name: Frank Love MRN: [id number] DOB: [birth year] Today's Date: 12/18/2019    History of Present Illness 64 y.o. male with medical history significant for chronic systolic CHF, ischemic cardiomyopathy with LVEF 35 to 40%, coronary artery disease status post CABG, penile cancer, carotid artery stenosis, who presented to [facility] via EMS after being found severely hypoxic at home. In the ED, he required nonrebreather and high flow nasal cannula to maintain O2 saturation greater than 92%.  Work-up in the ED revealed severe acute hypoxic respiratory failure secondary to COVID-19 viral pneumonia. Unvaccinated. Admitted 12/13/19    PT Comments    Patient tolerating slightly more activity in standing this date, however continues to require 3-5 minutes resting between standing trials to recover sats from 85% to 90-91%.    Follow Up Recommendations  Home health PT;Supervision/Assistance - 24 hour;Supervision - Intermittent     Equipment Recommendations  Rolling walker with 5" wheels;3in1 (PT)    Recommendations for Other Services       Precautions / Restrictions Precautions Precautions: Other (comment) Precaution Comments: 20L HHFNC; 70% FiO2    Mobility  Bed Mobility                  Transfers Overall transfer level: Needs assistance Equipment used: Rolling walker (2 wheeled) Transfers: Sit to/from Stand Sit to Stand: Min assist         General transfer comment: vcs for hand placement and steadying assist x 3 reps  Ambulation/Gait             General Gait Details: pregait activities with RW; stood x 3 for up to 60 seconds   Stairs             Wheelchair Mobility    Modified Rankin (Stroke Patients Only)       Balance Overall balance assessment: Needs assistance   Sitting balance-Leahy Scale: Good     Standing balance support: Bilateral upper extremity supported Standing balance-Leahy Scale: Poor                               Cognition Arousal/Alertness: Awake/alert Behavior During Therapy: WFL for tasks assessed/performed Overall Cognitive Status: Impaired/Different from baseline Area of Impairment: Attention;Safety/judgement;Awareness;Problem solving;Following commands                   Current Attention Level: Selective   Following Commands: Follows multi-step commands inconsistently Safety/Judgement: Decreased awareness of safety Awareness: Emergent Problem Solving: Slow processing;Difficulty sequencing;Requires verbal cues;Requires tactile cues General Comments: continues to try to walk away from chair when instructed not to; flops down into chair without reaching back      Exercises Other Exercises Other Exercises: flutter valve x 3 Other Exercises: incentive spirometer x 3- pulling 1250 ml Other Exercises: pursed lip breathing as recovering from standing activities    General Comments        Pertinent Vitals/Pain Pain Assessment: No/denies pain    Home Living                      Prior Function            PT Goals (current goals can now be found in the care plan section) Acute Rehab PT Goals Patient Stated Goal: to see his wife Time For Goal Achievement: 12/29/19 Potential to Achieve Goals: Fair Progress towards PT goals: Progressing toward goals    Frequency  Min 3X/week      PT Plan Current plan remains appropriate    Co-evaluation              AM-PAC PT "6 Clicks" Mobility   Outcome Measure  Help needed turning from your back to your side while in a flat bed without using bedrails?: A Little Help needed moving from lying on your back to sitting on the side of a flat bed without using bedrails?: A Little Help needed moving to and from a bed to a chair (including a wheelchair)?: A Little Help needed standing up from a chair using your arms (e.g., wheelchair or bedside chair)?: A Little Help needed to walk  in hospital room?: A Lot Help needed climbing 3-5 steps with a railing? : Total 6 Click Score: 15    End of Session Equipment Utilized During Treatment: Oxygen Activity Tolerance: Patient limited by fatigue;Treatment limited secondary to medical complications (Comment) (desaturates) Patient left: in chair;with call bell/phone within reach   PT Visit Diagnosis: Other abnormalities of gait and mobility (R26.89);Muscle weakness (generalized) (M62.81);Difficulty in walking, not elsewhere classified (R26.2)     Time: 0960-4540 PT Time Calculation (min) (ACUTE ONLY): 24 min  Charges:  $Gait Training: 8-22 mins $Therapeutic Exercise: 8-22 mins                      [name], PT Pager [phone]    [name] 12/18/2019, 1:12 PM

## 2019-12-18 NOTE — Progress Notes (Signed)
Inpatient Diabetes Program Recommendations  AACE/ADA: New Consensus Statement on Inpatient Glycemic Control (2015)  Target Ranges:  Prepandial:   less than 140 mg/dL      Peak postprandial:   less than 180 mg/dL (1-2 hours)      Critically ill patients:  140 - 180 mg/dL   Results for Zacharia, Edman R "RAY" (MRN [id number]) as of 12/18/2019 13:08  Ref. Range 12/17/2019 07:41 12/17/2019 11:59 12/17/2019 17:00 12/17/2019 21:13  Glucose-Capillary Latest Ref Range: 70 - 99 mg/dL 201 (H)  3 units NOVOLOG  5 units LANTUS  213 (H)  3  units NOVOLOG  5 units LANTUS  213 (H)  3 units NOVOLOG  197 (H)   Results for Marsland, Scott R "RAY" (MRN [id number]) as of 12/18/2019 13:08  Ref. Range 12/18/2019 07:30 12/18/2019 12:31  Glucose-Capillary Latest Ref Range: 70 - 99 mg/dL 215 (H)  3 units NOVOLOG  10 units LANTUS 214 (H)  3 units NOVOLOG     Home DM Meds: Farxiga 5 mg QPM  Current Orders: Lantus 10 units Daily     Novolog Sensitive Correction Scale/ SSI (0-9 units) TID AC    MD- Note patient getting Solumedrol 60 mg Q8 hours  CBGs >200 today  Please consider:  1. Increase Lantus to 15 units QHS  2. Start Novolog Meal Coverage: Novolog 3 units TID with meals  (Please add the following Hold Parameters: Hold if pt eats <50% of meal, Hold if pt NPO)    --Will follow patient during hospitalization--  Wyn Quaker RN, MSN, CDE Diabetes Coordinator Inpatient Glycemic Control Team Team Pager: [phone] (8a-5p)

## 2019-12-18 NOTE — TOC Initial Note (Signed)
Transition of Care [facility]) - Initial/Assessment Note    Patient Details  Name: Frank Love MRN: [id number] Date of Birth: October 30, 1955  Transition of Care [facility]) CM/SW Contact:    Carles Collet, RN Phone Number: 12/18/2019, 12:12 PM  Clinical Narrative:                Independent patient from home with wife who is also hospitalized for COVID at this time. Patient is currently on 20L of O2, and is still quite a few days away from DC. DC plan not yet determined at this time. Barrier to DC planning will be lack of insurance. TOC will continue to follow for medical stability and assist with DC needs as they are determined.    Expected Discharge Plan:  (TBD) Barriers to Discharge: Continued Medical Work up, Inadequate or no insurance   Patient Goals and CMS Choice        Expected Discharge Plan and Services Expected Discharge Plan:  (TBD)   Discharge Planning Services: CM Consult, Medication Assistance                                          Prior Living Arrangements/Services   Lives with:: Spouse                   Activities of Daily Living Home Assistive Devices/Equipment: None ADL Screening (condition at time of admission) Patient's cognitive ability adequate to safely complete daily activities?: Yes Is the patient deaf or have difficulty hearing?: No Does the patient have difficulty seeing, even when wearing glasses/contacts?: No Does the patient have difficulty concentrating, remembering, or making decisions?: No Patient able to express need for assistance with ADLs?: No Does the patient have difficulty dressing or bathing?: No Independently performs ADLs?: Yes (appropriate for developmental age) Does the patient have difficulty walking or climbing stairs?: No Weakness of Legs: None Weakness of Arms/Hands: None  Permission Sought/Granted                  Emotional Assessment              Admission diagnosis:  Acute hypoxemic  respiratory failure due to COVID-19 ([place]) [U07.1, J96.01] COVID-19 [U07.1] Patient Active Problem List   Diagnosis Date Noted  . COVID-19 12/13/2019  . Status post total replacement of right hip 03/08/2019  . Primary osteoarthritis of right hip 02/08/2019  . Stenosis of carotid artery 01/02/2019  . Educated about COVID-19 virus infection 01/02/2019  . Postoperative anemia due to acute blood loss 12/31/2018  . Debility 12/21/2018  . 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 Coronary artery disease 12/12/2018  . Coronary artery disease involving native coronary artery of native heart without angina pectoris   . Ischemic cardiomyopathy   . Acute on chronic systolic heart failure ([place]) 10/28/2018  . Preop cardiovascular exam 10/28/2018  . Abnormal EKG 10/28/2018   PCP:  [name], NP Pharmacy:   [facility], [place] [address] [place] [address] Phone: [phone] Fax: [place], [place] [address] Phone: [phone] Fax: [phone]  [facility], [place] [address] [place] [address] Phone: [phone] Fax: [phone]     Social Determinants of  Health (SDOH) Interventions    Readmission Risk Interventions Readmission Risk Prevention Plan 12/21/2018  Transportation Screening (No Data)  Some recent data might be hidden

## 2019-12-18 NOTE — Progress Notes (Signed)
Ddimer continues to trend down and <2 now. Ok to reduce Lovenox to 0.5mg /kg/day per Dr. Rodena Piety.  Onnie Boer, PharmD, BCIDP, AAHIVP, CPP Infectious Disease Pharmacist 12/18/2019 2:52 PM

## 2019-12-19 LAB — GLUCOSE, CAPILLARY
Glucose-Capillary: 181 mg/dL — ABNORMAL HIGH (ref 70–99)
Glucose-Capillary: 246 mg/dL — ABNORMAL HIGH (ref 70–99)
Glucose-Capillary: 156 mg/dL — ABNORMAL HIGH (ref 70–99)
Glucose-Capillary: 220 mg/dL — ABNORMAL HIGH (ref 70–99)

## 2019-12-19 LAB — CULTURE, BLOOD (ROUTINE X 2)
Culture: NO GROWTH
Special Requests: ADEQUATE

## 2019-12-19 LAB — BRAIN NATRIURETIC PEPTIDE: B Natriuretic Peptide: 365.7 pg/mL — ABNORMAL HIGH (ref 0.0–100.0)

## 2019-12-19 MED ORDER — METHYLPREDNISOLONE SODIUM SUCC 125 MG IJ SOLR
60.0000 mg | Freq: Two times a day (BID) | INTRAMUSCULAR | Status: DC
  Administered 2019-12-19 – 2019-12-22 (×6): 60 mg via INTRAVENOUS
  Filled 2019-12-19 (×6): qty 2

## 2019-12-19 NOTE — Progress Notes (Signed)
Patient resting in bed near end of shift. Patient A&O x 4. Calm and cooperative. Continues on HFNC at 15L 70% saturations >96%. Patient SR/SB, SB when sleeping. Patient continent of bowel and bladder and does get up to bedside chair x1 assist. Patient does will with IS and flutter valve. Patient continues with solu medrol treatment. Safety measures in place. Will continue to monitor and SBARR to day shift nurse.

## 2019-12-19 NOTE — Plan of Care (Signed)
  Problem: Education: Goal: Knowledge of risk factors and measures for prevention of condition will improve Outcome: Progressing   Problem: Respiratory: Goal: Will maintain a patent airway Outcome: Progressing Goal: Complications related to the disease process, condition or treatment will be avoided or minimized Outcome: Progressing   Problem: Education: Goal: Knowledge of General Education information will improve Description: Including pain rating scale, medication(s)/side effects and non-pharmacologic comfort measures Outcome: Progressing   Problem: Health Behavior/Discharge Planning: Goal: Ability to manage health-related needs will improve Outcome: Progressing   Problem: Clinical Measurements: Goal: Ability to maintain clinical measurements within normal limits will improve Outcome: Progressing Goal: Will remain free from infection Outcome: Progressing Goal: Diagnostic test results will improve Outcome: Progressing Goal: Respiratory complications will improve Outcome: Progressing Goal: Cardiovascular complication will be avoided Outcome: Progressing   Problem: Activity: Goal: Risk for activity intolerance will decrease Outcome: Progressing   Problem: Nutrition: Goal: Adequate nutrition will be maintained Outcome: Progressing   Problem: Coping: Goal: Level of anxiety will decrease Outcome: Progressing   Problem: Elimination: Goal: Will not experience complications related to bowel motility Outcome: Progressing Goal: Will not experience complications related to urinary retention Outcome: Progressing   Problem: Pain Managment: Goal: General experience of comfort will improve Outcome: Progressing   Problem: Safety: Goal: Ability to remain free from injury will improve Outcome: Progressing   Problem: Skin Integrity: Goal: Risk for impaired skin integrity will decrease Outcome: Progressing   

## 2019-12-19 NOTE — Progress Notes (Signed)
PROGRESS NOTE    Frank Love  YQM:[id number] DOB: December 19, 1955 DOA: 12/13/2019 PCP: [name], NP   Brief Narrative:64 y.o.malewith medical history significant forchronic systolic CHF, ischemic cardiomyopathy with LVEF 35 to 40%, coronary artery disease status post CABG, penile cancer, carotid artery stenosis, who presented to [facility] via EMS after being found severely hypoxicathome.  Unfortunately patient is unvaccinated against Covid, his work-up was significant for COVID-19 of pneumonia and severe hypoxia for which he is admitted by [facility].  Assessment & Plan:   Active Problems:   Ischemic cardiomyopathy   S/P CABG x 4   Coronary artery disease   COVID-19  #1 acute hypoxic respiratory failure due to Covid pneumonia-patient is still requiring high flow nasal cannula at and nonrebreather mask intermittently.  He received remdesivir and he received Actemra 11/13/2019. Continue IV Solu-Medrol. Trend inflammatory markers. Encourage incentive spirometry flutter valve and out of bed. Encourage prone in bed at night. His oxygen requirement slightly better than yesterday 97% on 15 L down from 20 L yesterday. Taper Solu-Medrol to twice a day today 12/19/2019.  Actemra/Baricitinib  off label use - patient was told that if COVID-19 pneumonitis gets worse we might potentially use Actemra off label, patient denies any known history of active diverticulitis, tuberculosis or hepatitis, understands the risks and benefits and wants to proceed with Actemra treatment if required.   #2 chronic systolic heart failure last echo in March 2021 with ejection fraction 35 to 40%.  Patient has had some episodes of bradycardia.  He was on digoxin Entresto and Aldactone and Coreg at home.  These have been on hold due to soft  blood pressure and bradycardia. Coreg was reordered 12/17/2019 however he still remains hypotensive. Blood pressure 96/53 with a heart rate between 58 and 60. Cardiac stopped  12/19/2019.    #3 steroid-induced hyperglycemia hemoglobin A1c 6.1 he has no history of type 2 diabetes.  Lantus started 10 units daily. CBG (last 3)  Recent Labs    12/18/19 1725 12/18/19 2013 12/19/19 0738  GLUCAP 194* 235* 181*     #4 history of type CKD type II stable-creatinine trending down.  #5 history of CAD and CABG continue statin and home aspirin.  #6 history of essential hypertension currently blood pressure soft holding home meds.  Estimated body mass index is 32.35 kg/m as calculated from the following:   Height as of this encounter: 6\' 2"  (1.88 m).   Weight as of this encounter: 114.3 kg.  DVT prophylaxis: Lovenox twice a day  code Status: Full code Family Communication: Discussed with wife and daughter. Disposition Plan:  Status is: Inpatient   Dispo:  Patient From: Home  planned Disposition: Home  expected discharge date within 3 days  medically stable for discharge: No   Consultants:   None  Procedures: None Antimicrobials: Anti-infectives (From admission, onward)   Start     Dose/Rate Route Frequency Ordered Stop   12/15/19 1000  remdesivir 100 mg in sodium chloride 0.9 % 100 mL IVPB       "Followed by" Linked Group Details   100 mg 200 mL/hr over 30 Minutes Intravenous Daily 12/14/19 0002 12/18/19 0849   12/14/19 1000  remdesivir 100 mg in sodium chloride 0.9 % 100 mL IVPB  Status:  Discontinued       "Followed by" Linked Group Details   100 mg 200 mL/hr over 30 Minutes Intravenous Daily 12/13/19 2358 12/14/19 0001   12/14/19 0030  remdesivir 100 mg in sodium chloride 0.9 %  100 mL IVPB       "Followed by" Linked Group Details   100 mg 200 mL/hr over 30 Minutes Intravenous Every 30 min 12/14/19 0002 12/14/19 0210   12/14/19 0000  remdesivir 200 mg in sodium chloride 0.9% 250 mL IVPB  Status:  Discontinued       "Followed by" Linked Group Details   200 mg 580 mL/hr over 30 Minutes Intravenous Once 12/13/19 2358 12/14/19 0001   12/13/19  2245  vancomycin (VANCOREADY) IVPB 2000 mg/400 mL        2,000 mg 200 mL/hr over 120 Minutes Intravenous  Once 12/13/19 2241 12/14/19 0210   12/13/19 2245  ceFEPIme (MAXIPIME) 2 g in sodium chloride 0.9 % 100 mL IVPB        2 g 200 mL/hr over 30 Minutes Intravenous  Once 12/13/19 2241 12/14/19 0002       Subjective: Patient sitting up in chair in no acute distress eating breakfast has good appetite continues to be hypoxic on room air.  Objective: Vitals:   12/19/19 0400 12/19/19 0407 12/19/19 0734 12/19/19 0819  BP: (!) 98/57 (!) 98/57 104/71 104/71  Pulse: (!) 48 65 60 (!) 59  Resp: 12 19 18 19   Temp:  97.9 F (36.6 C) (!) 97.5 F (36.4 C)   TempSrc:  Oral Oral   SpO2: 99% 98% 95% 97%  Weight:  114.3 kg    Height:        Intake/Output Summary (Last 24 hours) at 12/19/2019 0943 Last data filed at 12/19/2019 0223 Gross per 24 hour  Intake --  Output 600 ml  Net -600 ml   Filed Weights   12/17/19 0415 12/18/19 0412 12/19/19 0407  Weight: 111 kg 114 kg 114.3 kg    Examination:  General exam: Appears calm and comfortable  Respiratory system: Coarse breath sounds to auscultation. Respiratory effort normal. Cardiovascular system: S1 & S2 heard, RRR. No JVD, murmurs, rubs, gallops or clicks. No pedal edema. Gastrointestinal system: Abdomen is nondistended, soft and nontender. No organomegaly or masses felt. Normal bowel sounds heard. Central nervous system: Alert and oriented. No focal neurological deficits. Extremities: Symmetric 5 x 5 power. Skin: No rashes, lesions or ulcers Psychiatry: Judgement and insight appear normal. Mood & affect appropriate.     Data Reviewed: I have personally reviewed following labs and imaging studies  CBC: Recent Labs  Lab 12/14/19 0543 12/15/19 0350 12/16/19 0708 12/17/19 0145 12/18/19 0040  WBC 4.9 3.4* 9.0 9.5 8.3  NEUTROABS 4.0 3.1 8.1* 8.6* 7.4  HGB 12.0* 11.5* 11.2* 11.5* 11.2*  HCT 38.1* 35.3* 34.4* 36.0* 35.3*  MCV  88.4 85.9 86.4 87.6 87.2  PLT 217 269 315 314 540   Basic Metabolic Panel: Recent Labs  Lab 12/14/19 0543 12/15/19 0350 12/16/19 0708 12/17/19 0145 12/18/19 0040  NA 140 139 140 140 138  K 4.1 4.1 4.4 4.5 4.7  CL 112* 114* 112* 112* 109  CO2 15* 15* 19* 18* 18*  GLUCOSE 125* 214* 234* 205* 224*  BUN 48* 43* 56* 63* 65*  CREATININE 1.23 1.16 1.51* 1.45* 1.26*  CALCIUM 7.3* 7.5* 7.6* 7.7* 7.7*  MG 3.6* 3.8* 3.5* 3.4* 3.6*  PHOS 4.1 5.1* 4.2 4.5 3.8   GFR: Estimated Creatinine Clearance: 79.6 mL/min (A) (by C-G formula based on SCr of 1.26 mg/dL (H)). Liver Function Tests: Recent Labs  Lab 12/14/19 0543 12/15/19 0350 12/16/19 0708 12/17/19 0145 12/18/19 0040  AST 75* 47* 36 38 37  ALT 83* 67* 59* 59*  55*  ALKPHOS 70 64 53 55 58  BILITOT 1.5* 1.0 0.6 1.0 0.7  PROT 6.3* 6.4* 6.1* 6.2* 5.7*  ALBUMIN 2.4* 2.2* 2.4* 2.5* 2.4*   Recent Labs  Lab 12/13/19 2147  LIPASE 54*   No results for input(s): AMMONIA in the last 168 hours. Coagulation Profile: No results for input(s): INR, PROTIME in the last 168 hours. Cardiac Enzymes: No results for input(s): CKTOTAL, CKMB, CKMBINDEX, TROPONINI in the last 168 hours. BNP (last 3 results) No results for input(s): PROBNP in the last 8760 hours. HbA1C: No results for input(s): HGBA1C in the last 72 hours. CBG: Recent Labs  Lab 12/18/19 0730 12/18/19 1231 12/18/19 1725 12/18/19 2013 12/19/19 0738  GLUCAP 215* 214* 194* 235* 181*   Lipid Profile: No results for input(s): CHOL, HDL, LDLCALC, TRIG, CHOLHDL, LDLDIRECT in the last 72 hours. Thyroid Function Tests: No results for input(s): TSH, T4TOTAL, FREET4, T3FREE, THYROIDAB in the last 72 hours. Anemia Panel: Recent Labs    12/17/19 0145 12/18/19 0040  FERRITIN 412* 434*   Sepsis Labs: Recent Labs  Lab 12/13/19 2147 12/14/19 0543  PROCALCITON  --  <0.10  LATICACIDVEN 1.4  --     Recent Results (from the past 240 hour(s))  Respiratory Panel by RT PCR (Flu  A&B, Covid) - Nasopharyngeal Swab     Status: Abnormal   Collection Time: 12/13/19  9:47 PM   Specimen: Nasopharyngeal Swab; Nasopharyngeal(NP) swabs in vial transport medium  Result Value Ref Range Status   SARS Coronavirus 2 by RT PCR POSITIVE (A) NEGATIVE Final    Comment: RESULT CALLED TO, READ BACK BY AND VERIFIED WITH: E SULLIVAN RN 12/13/19 2330 JDW (NOTE) SARS-CoV-2 target nucleic acids are DETECTED.  SARS-CoV-2 RNA is generally detectable in upper respiratory specimens  during the acute phase of infection. Positive results are indicative of the presence of the identified virus, but do not rule out bacterial infection or co-infection with other pathogens not detected by the test. Clinical correlation with patient history and other diagnostic information is necessary to determine patient infection status. The expected result is Negative.  Fact Sheet for Patients:  PinkCheek.be  Fact Sheet for Healthcare Providers: GravelBags.it  This test is not yet approved or cleared by the [place] FDA and  has been authorized for detection and/or diagnosis of SARS-CoV-2 by FDA under an Emergency Use Authorization (EUA).  This EUA will remain in effect (meaning this test can be use d) for the duration of  the COVID-19 declaration under Section 564(b)(1) of the Act, 21 U.S.C. section 360bbb-3(b)(1), unless the authorization is terminated or revoked sooner.      Influenza A by PCR NEGATIVE NEGATIVE Final   Influenza B by PCR NEGATIVE NEGATIVE Final    Comment: (NOTE) The Xpert Xpress SARS-CoV-2/FLU/RSV assay is intended as an aid in  the diagnosis of influenza from Nasopharyngeal swab specimens and  should not be used as a sole basis for treatment. Nasal washings and  aspirates are unacceptable for Xpert Xpress SARS-CoV-2/FLU/RSV  testing.  Fact Sheet for Patients: PinkCheek.be  Fact Sheet  for Healthcare Providers: GravelBags.it  This test is not yet approved or cleared by the [place] FDA and  has been authorized for detection and/or diagnosis of SARS-CoV-2 by  FDA under an Emergency Use Authorization (EUA). This EUA will remain  in effect (meaning this test can be used) for the duration of the  Covid-19 declaration under Section 564(b)(1) of the Act, 21  U.S.C. section 360bbb-3(b)(1), unless the  authorization is  terminated or revoked. Performed at [facility], [place] [address] [place], [place] [address]   Blood culture (routine x 2)     Status: None   Collection Time: 12/13/19 10:45 PM   Specimen: BLOOD  Result Value Ref Range Status   Specimen Description BLOOD RIGHT ANTECUBITAL  Final   Special Requests   Final    BOTTLES DRAWN AEROBIC AND ANAEROBIC Blood Culture adequate volume   Culture   Final    NO GROWTH 5 DAYS Performed at [facility], [place] [address] [place], [place] [address]    Report Status 12/18/2019 FINAL  Final  Blood culture (routine x 2)     Status: None   Collection Time: 12/13/19 11:27 PM   Specimen: BLOOD RIGHT HAND  Result Value Ref Range Status   Specimen Description BLOOD RIGHT HAND  Final   Special Requests   Final    BOTTLES DRAWN AEROBIC AND ANAEROBIC Blood Culture adequate volume   Culture   Final    NO GROWTH 5 DAYS Performed at [facility], [place] [address], [place], [place] [address]    Report Status 12/19/2019 FINAL  Final         Radiology Studies: No results found.      Scheduled Meds: . vitamin C  500 mg Oral Daily  . aspirin EC  81 mg Oral QHS  . atorvastatin  40 mg Oral q1800  . cholecalciferol  1,000 Units Oral Daily  . enoxaparin (LOVENOX) injection  60 mg Subcutaneous Q24H  . feeding supplement (GLUCERNA SHAKE)  237 mL Oral TID BM  . folic acid  1 mg Oral Daily  . insulin aspart  0-9 Units Subcutaneous TID WC  . insulin glargine  10 Units Subcutaneous  Daily  . Ipratropium-Albuterol  1 puff Inhalation BID  . methylPREDNISolone (SOLU-MEDROL) injection  60 mg Intravenous Q8H  . multivitamin with minerals  1 tablet Oral Daily  . pantoprazole  40 mg Oral Daily  . thiamine  100 mg Oral Daily  . zinc sulfate  220 mg Oral Daily   Continuous Infusions:   LOS: 6 days     Georgette Shell, MD  12/19/2019, 9:43 AM

## 2019-12-20 LAB — GLUCOSE, CAPILLARY
Glucose-Capillary: 152 mg/dL — ABNORMAL HIGH (ref 70–99)
Glucose-Capillary: 219 mg/dL — ABNORMAL HIGH (ref 70–99)
Glucose-Capillary: 130 mg/dL — ABNORMAL HIGH (ref 70–99)
Glucose-Capillary: 173 mg/dL — ABNORMAL HIGH (ref 70–99)

## 2019-12-20 MED ORDER — ONDANSETRON HCL 4 MG/2ML IJ SOLN
4.0000 mg | Freq: Four times a day (QID) | INTRAMUSCULAR | Status: DC | PRN
  Administered 2019-12-22 – 2019-12-28 (×9): 4 mg via INTRAVENOUS
  Filled 2019-12-20 (×9): qty 2

## 2019-12-20 MED ORDER — SALINE SPRAY 0.65 % NA SOLN
1.0000 | NASAL | Status: DC | PRN
  Filled 2019-12-20: qty 44

## 2019-12-20 MED ORDER — ENOXAPARIN SODIUM 60 MG/0.6ML ~~LOC~~ SOLN
55.0000 mg | SUBCUTANEOUS | Status: DC
  Administered 2019-12-20 – 2019-12-29 (×10): 55 mg via SUBCUTANEOUS
  Filled 2019-12-20 (×10): qty 0.6

## 2019-12-20 MED ORDER — TRIMETHOBENZAMIDE HCL 100 MG/ML IM SOLN
200.0000 mg | Freq: Four times a day (QID) | INTRAMUSCULAR | Status: DC | PRN
  Administered 2019-12-20: 200 mg via INTRAMUSCULAR
  Filled 2019-12-20 (×2): qty 2

## 2019-12-20 NOTE — Plan of Care (Signed)
  Problem: Respiratory: Goal: Will maintain a patent airway Outcome: Progressing Goal: Complications related to the disease process, condition or treatment will be avoided or minimized Outcome: Progressing   Problem: Safety: Goal: Ability to remain free from injury will improve Outcome: Progressing

## 2019-12-20 NOTE — Progress Notes (Signed)
Occupational Therapy Treatment Patient Details Name: Frank Love MRN: [id number] DOB: 09-15-1955 Today's Date: 12/20/2019    History of present illness 64 y.o. male with medical history significant for chronic systolic CHF, ischemic cardiomyopathy with LVEF 35 to 40%, coronary artery disease status post CABG, penile cancer, carotid artery stenosis, who presented to [facility] via EMS after being found severely hypoxic at home. In the ED, he required nonrebreather and high flow nasal cannula to maintain O2 saturation greater than 92%.  Work-up in the ED revealed severe acute hypoxic respiratory failure secondary to COVID-19 viral pneumonia. Unvaccinated. Admitted 12/13/19   OT comments   Pt continues to present with decreased activity tolerance, impaired functional endurance and dyspnea with exertion impacting pts ability to complete BADLs independently. Pt on 7L HFNC upon entry with O2 99% at rest with pt in supine. Pt completed stand pivot transfer from EOB>recliner with RW and min guard assist. Pt desaturated to 85% needing 2 min seated rest break to rebound to >90%. Pt demo'ed good recall of pursed lip breathing technique and proper usage of IS and flutter valve. Pt consistently pulling 1750 ml on IS x10 reps- continued education on using devices 10x each hour. Pt completed additional sit<>stand from recliner as precursor to higher level functional mobility with min guard assist with pt able to stand ~ 1 min before needing to return to sitting secondary to fatigue and notable SOB with pt desaturating to 85%. Pt requires ~ 2 mins to return to >90%. Agree with DC plan below, will follow acutely per POC.    Follow Up Recommendations  Home health OT;Supervision/Assistance - 24 hour    Equipment Recommendations  3 in 1 bedside commode    Recommendations for Other Services      Precautions / Restrictions Precautions Precautions: Other (comment) Precaution Comments: 7L HFNC Restrictions Weight  Bearing Restrictions: No       Mobility Bed Mobility Overal bed mobility: Needs Assistance Bed Mobility: Supine to Sit     Supine to sit: Min guard;HOB elevated     General bed mobility comments: for safety and line mgmt  Transfers Overall transfer level: Needs assistance Equipment used: Rolling walker (2 wheeled) Transfers: Sit to/from [facility] Sit to Stand: Min guard Stand pivot transfers: Min guard       General transfer comment: pt required cues for hand placement and min guard for safety and line mgmt to pivot from EOB>recliner with RW. Once in chair pt completed additonal sit<>stand from recliner with min guard- pt able to maintain standing ~ 1 min before needing to return to sitting    Balance Overall balance assessment: Needs assistance   Sitting balance-Leahy Scale: Good     Standing balance support: Bilateral upper extremity supported Standing balance-Leahy Scale: Poor Standing balance comment: reliant on BUE support                           ADL either performed or assessed with clinical judgement   ADL Overall ADL's : Needs assistance/impaired     Grooming: Oral care;Sitting;Set up Grooming Details (indicate cue type and reason): completed seated oral care from chair with set- up assist     Lower Body Bathing: Supervison/ safety;Sitting/lateral leans Lower Body Bathing Details (indicate cue type and reason): simulated via LB dressing from EOB     Lower Body Dressing: Supervision/safety;Sitting/lateral leans Lower Body Dressing Details (indicate cue type and reason): to adjust socks from EOB Toilet Transfer:  Min Arts development officer Details (indicate cue type and reason): minguard for safety and line mgmt to pivot from EOB>recliner with RW         Functional mobility during ADLs: Min guard;Rolling walker General ADL Comments: pt continues to present with dyspnea with exertion, decreased activity  tolerance and impaired functional endurance however pt on less supplemental O2 this session and continues to be motivated during sessions     Vision       Perception     Praxis      Cognition Arousal/Alertness: Awake/alert Behavior During Therapy: WFL for tasks assessed/performed Overall Cognitive Status: Within Functional Limits for tasks assessed                                 General Comments: slight safety awareness deficits noted by pt alluding to not needing RW for support, but overall WFL for simple mobility tasks        Exercises Other Exercises Other Exercises: flutter valve x10 reps Other Exercises: IS x10 pulling 55fo ml Other Exercises: pursed lip breathing as recovering from standing activities Other Exercises: pt demo'ed BUE therex from chair with level 1 theraband - D1 shoulder flexion pattern, bicep curls and tricep extensions x3 reps Other Exercises: encouraged BLE therex from chair such as seated marching and seated knee extension x5 reps each leg   Shoulder Instructions       General Comments pt consistently pulling 1750 on IS- continued education on using IS 10x every hour. pt on 7L HFNC at start of session; pt desaturated to 85% during mobility tasks needing 1-2 min seated rest break to increase to >90%. Pt reports needing to be able to lift 10-15 lbs for work    Pertinent Vitals/ Pain       Pain Assessment: No/denies pain  Home Living                                          Prior Functioning/Environment              Frequency  Min 2X/week        Progress Toward Goals  OT Goals(current goals can now be found in the care plan section)  Progress towards OT goals: Progressing toward goals  Acute Rehab OT Goals Patient Stated Goal: to see his wife OT Goal Formulation: With patient Time For Goal Achievement: 12/30/19 Potential to Achieve Goals: Good  Plan Discharge plan remains appropriate;Frequency  remains appropriate    Co-evaluation                 AM-PAC OT "6 Clicks" Daily Activity     Outcome Measure   Help from another person eating meals?: None Help from another person taking care of personal grooming?: None Help from another person toileting, which includes using toliet, bedpan, or urinal?: A Little Help from another person bathing (including washing, rinsing, drying)?: A Little Help from another person to put on and taking off regular upper body clothing?: None Help from another person to put on and taking off regular lower body clothing?: A Little 6 Click Score: 21    End of Session Equipment Utilized During Treatment: Gait belt;Rolling walker;Oxygen;Other (comment) (7L HFNC)  OT Visit Diagnosis: Unsteadiness on feet (R26.81);Other abnormalities of gait and mobility (R26.89);Muscle weakness (generalized) (M62.81);Other symptoms and signs involving  cognitive function;Dizziness and giddiness (R42)   Activity Tolerance Patient tolerated treatment well   Patient Left in chair;with call bell/phone within reach   Nurse Communication Mobility status        Time: 4944-7395 OT Time Calculation (min): 26 min  Charges: OT General Charges $OT Visit: 1 Visit OT Treatments $Self Care/Home Management : 8-22 mins $Therapeutic Activity: 8-22 mins  Lanier Clam., COTA/L Acute Rehabilitation Services [phone]    Ihor Gully 12/20/2019, 9:53 AM

## 2019-12-20 NOTE — Progress Notes (Signed)
PROGRESS NOTE    Frank Love  GYB:[id number] DOB: [birth year] DOA: 12/13/2019 PCP: [name], NP   Brief Narrative:64 y.o.malewith medical history significant forchronic systolic CHF, ischemic cardiomyopathy with LVEF 35 to 40%, coronary artery disease status post CABG, penile cancer, carotid artery stenosis, who presented to [facility] via EMS after being found severely hypoxicathome.  Unfortunately patient is unvaccinated against Covid, his work-up was significant for COVID-19 of pneumonia and severe hypoxia for which he is admitted by [facility].  Assessment & Plan:   Active Problems:   Ischemic cardiomyopathy   S/P CABG x 4   Coronary artery disease   COVID-19  #1 acute hypoxic respiratory failure due to Covid pneumonia-his oxygen requirement has been improving and now he is down to 4 L from 20 L 2 days ago.  He has finished a course of remdesivir and Actemra.  Saved Actemra on 11/13/2019.  We will continue IV steroids.  Solu-Medrol tapered down to twice a day 12/19/2019.  Inflammatory markers are improving. Encourage incentive spirometry flutter valve and out of bed. Encourage prone in bed at night.  Actemra/Baricitinib  off label use - patient was told that if COVID-19 pneumonitis gets worse we might potentially use Actemra off label, patient denies any known history of active diverticulitis, tuberculosis or hepatitis, understands the risks and benefits and wants to proceed with Actemra treatment if required.   #2 chronic systolic heart failure last echo in March 2021 with ejection fraction 35 to 40%.  Patient has had some episodes of bradycardia.  He was on digoxin Entresto and Aldactone and Coreg at home.  These have been on hold due to soft  blood pressure and bradycardia. Coreg was reordered 12/17/2019 however he still remains hypotensive. Blood pressure improving 116/69 from 96/53 with improvement in his heart rate with no episodes of bradycardia today.  #3  steroid-induced hyperglycemia hemoglobin A1c 6.1 he has no history of type 2 diabetes.  Lantus started 10 units daily. CBG (last 3)  Recent Labs    12/19/19 2008 12/20/19 0812 12/20/19 1234  GLUCAP 220* 152* 219*     #4 history of type CKD type II stable-creatinine trending down.  #5 history of CAD and CABG continue statin and home aspirin.  #6 history of essential hypertension currently blood pressure soft holding home meds.  Estimated body mass index is 32.27 kg/m as calculated from the following:   Height as of this encounter: 6\' 2"  (1.88 m).   Weight as of this encounter: 114 kg.  DVT prophylaxis: Lovenox twice a day  code Status: Full code Family Communication: Discussed with wife and daughter. Disposition Plan:  Status is: Inpatient   Dispo:  Patient From: Home  planned Disposition: Home  expected discharge date within 3 days  medically stable for discharge: No   Consultants:   None  Procedures: None Antimicrobials: Anti-infectives (From admission, onward)   Start     Dose/Rate Route Frequency Ordered Stop   12/15/19 1000  remdesivir 100 mg in sodium chloride 0.9 % 100 mL IVPB       "Followed by" Linked Group Details   100 mg 200 mL/hr over 30 Minutes Intravenous Daily 12/14/19 0002 12/18/19 0849   12/14/19 1000  remdesivir 100 mg in sodium chloride 0.9 % 100 mL IVPB  Status:  Discontinued       "Followed by" Linked Group Details   100 mg 200 mL/hr over 30 Minutes Intravenous Daily 12/13/19 2358 12/14/19 0001   12/14/19 0030  remdesivir 100  mg in sodium chloride 0.9 % 100 mL IVPB       "Followed by" Linked Group Details   100 mg 200 mL/hr over 30 Minutes Intravenous Every 30 min 12/14/19 0002 12/14/19 0210   12/14/19 0000  remdesivir 200 mg in sodium chloride 0.9% 250 mL IVPB  Status:  Discontinued       "Followed by" Linked Group Details   200 mg 580 mL/hr over 30 Minutes Intravenous Once 12/13/19 2358 12/14/19 0001   12/13/19 2245  vancomycin  (VANCOREADY) IVPB 2000 mg/400 mL        2,000 mg 200 mL/hr over 120 Minutes Intravenous  Once 12/13/19 2241 12/14/19 0210   12/13/19 2245  ceFEPIme (MAXIPIME) 2 g in sodium chloride 0.9 % 100 mL IVPB        2 g 200 mL/hr over 30 Minutes Intravenous  Once 12/13/19 2241 12/14/19 0002       Subjective: He he is resting in bed eating breakfast feels better has a good appetite  Objective: Vitals:   12/20/19 0406 12/20/19 0814 12/20/19 0828 12/20/19 1233  BP: 114/62 114/67  116/69  Pulse: 68 (!) 59  60  Resp: 15 20  20   Temp: 99 F (37.2 C) (!) 97.3 F (36.3 C)  97.6 F (36.4 C)  TempSrc: Oral Oral  Oral  SpO2: 100% 95% 94% 98%  Weight: 114 kg     Height:        Intake/Output Summary (Last 24 hours) at 12/20/2019 1318 Last data filed at 12/20/2019 0500 Gross per 24 hour  Intake --  Output 900 ml  Net -900 ml   Filed Weights   12/18/19 0412 12/19/19 0407 12/20/19 0406  Weight: 114 kg 114.3 kg 114 kg    Examination:  General exam: Appears calm and comfortable  Respiratory system: Coarse breath sounds to auscultation. Respiratory effort normal. Cardiovascular system: S1 & S2 heard, RRR. No JVD, murmurs, rubs, gallops or clicks. No pedal edema. Gastrointestinal system: Abdomen is nondistended, soft and nontender. No organomegaly or masses felt. Normal bowel sounds heard. Central nervous system: Alert and oriented. No focal neurological deficits. Extremities: Symmetric 5 x 5 power. Skin: No rashes, lesions or ulcers Psychiatry: Judgement and insight appear normal. Mood & affect appropriate.     Data Reviewed: I have personally reviewed following labs and imaging studies  CBC: Recent Labs  Lab 12/14/19 0543 12/15/19 0350 12/16/19 0708 12/17/19 0145 12/18/19 0040  WBC 4.9 3.4* 9.0 9.5 8.3  NEUTROABS 4.0 3.1 8.1* 8.6* 7.4  HGB 12.0* 11.5* 11.2* 11.5* 11.2*  HCT 38.1* 35.3* 34.4* 36.0* 35.3*  MCV 88.4 85.9 86.4 87.6 87.2  PLT 217 269 315 314 144   Basic  Metabolic Panel: Recent Labs  Lab 12/14/19 0543 12/15/19 0350 12/16/19 0708 12/17/19 0145 12/18/19 0040  NA 140 139 140 140 138  K 4.1 4.1 4.4 4.5 4.7  CL 112* 114* 112* 112* 109  CO2 15* 15* 19* 18* 18*  GLUCOSE 125* 214* 234* 205* 224*  BUN 48* 43* 56* 63* 65*  CREATININE 1.23 1.16 1.51* 1.45* 1.26*  CALCIUM 7.3* 7.5* 7.6* 7.7* 7.7*  MG 3.6* 3.8* 3.5* 3.4* 3.6*  PHOS 4.1 5.1* 4.2 4.5 3.8   GFR: Estimated Creatinine Clearance: 79.5 mL/min (A) (by C-G formula based on SCr of 1.26 mg/dL (H)). Liver Function Tests: Recent Labs  Lab 12/14/19 0543 12/15/19 0350 12/16/19 0708 12/17/19 0145 12/18/19 0040  AST 75* 47* 36 38 37  ALT 83* 67* 59* 59* 55*  ALKPHOS 70 64 53 55 58  BILITOT 1.5* 1.0 0.6 1.0 0.7  PROT 6.3* 6.4* 6.1* 6.2* 5.7*  ALBUMIN 2.4* 2.2* 2.4* 2.5* 2.4*   Recent Labs  Lab 12/13/19 2147  LIPASE 54*   No results for input(s): AMMONIA in the last 168 hours. Coagulation Profile: No results for input(s): INR, PROTIME in the last 168 hours. Cardiac Enzymes: No results for input(s): CKTOTAL, CKMB, CKMBINDEX, TROPONINI in the last 168 hours. BNP (last 3 results) No results for input(s): PROBNP in the last 8760 hours. HbA1C: No results for input(s): HGBA1C in the last 72 hours. CBG: Recent Labs  Lab 12/19/19 1141 12/19/19 1653 12/19/19 2008 12/20/19 0812 12/20/19 1234  GLUCAP 246* 156* 220* 152* 219*   Lipid Profile: No results for input(s): CHOL, HDL, LDLCALC, TRIG, CHOLHDL, LDLDIRECT in the last 72 hours. Thyroid Function Tests: No results for input(s): TSH, T4TOTAL, FREET4, T3FREE, THYROIDAB in the last 72 hours. Anemia Panel: Recent Labs    12/18/19 0040  FERRITIN 434*   Sepsis Labs: Recent Labs  Lab 12/13/19 2147 12/14/19 0543  PROCALCITON  --  <0.10  LATICACIDVEN 1.4  --     Recent Results (from the past 240 hour(s))  Respiratory Panel by RT PCR (Flu A&B, Covid) - Nasopharyngeal Swab     Status: Abnormal   Collection Time: 12/13/19   9:47 PM   Specimen: Nasopharyngeal Swab; Nasopharyngeal(NP) swabs in vial transport medium  Result Value Ref Range Status   SARS Coronavirus 2 by RT PCR POSITIVE (A) NEGATIVE Final    Comment: RESULT CALLED TO, READ BACK BY AND VERIFIED WITH: E SULLIVAN RN 12/13/19 2330 JDW (NOTE) SARS-CoV-2 target nucleic acids are DETECTED.  SARS-CoV-2 RNA is generally detectable in upper respiratory specimens  during the acute phase of infection. Positive results are indicative of the presence of the identified virus, but do not rule out bacterial infection or co-infection with other pathogens not detected by the test. Clinical correlation with patient history and other diagnostic information is necessary to determine patient infection status. The expected result is Negative.  Fact Sheet for Patients:  PinkCheek.be  Fact Sheet for Healthcare Providers: GravelBags.it  This test is not yet approved or cleared by the [place] FDA and  has been authorized for detection and/or diagnosis of SARS-CoV-2 by FDA under an Emergency Use Authorization (EUA).  This EUA will remain in effect (meaning this test can be use d) for the duration of  the COVID-19 declaration under Section 564(b)(1) of the Act, 21 U.S.C. section 360bbb-3(b)(1), unless the authorization is terminated or revoked sooner.      Influenza A by PCR NEGATIVE NEGATIVE Final   Influenza B by PCR NEGATIVE NEGATIVE Final    Comment: (NOTE) The Xpert Xpress SARS-CoV-2/FLU/RSV assay is intended as an aid in  the diagnosis of influenza from Nasopharyngeal swab specimens and  should not be used as a sole basis for treatment. Nasal washings and  aspirates are unacceptable for Xpert Xpress SARS-CoV-2/FLU/RSV  testing.  Fact Sheet for Patients: PinkCheek.be  Fact Sheet for Healthcare Providers: GravelBags.it  This test is  not yet approved or cleared by the [place] FDA and  has been authorized for detection and/or diagnosis of SARS-CoV-2 by  FDA under an Emergency Use Authorization (EUA). This EUA will remain  in effect (meaning this test can be used) for the duration of the  Covid-19 declaration under Section 564(b)(1) of the Act, 21  U.S.C. section 360bbb-3(b)(1), unless the authorization is  terminated or  revoked. Performed at [facility], [place] [address], [place], [place] [address]   Blood culture (routine x 2)     Status: None   Collection Time: 12/13/19 10:45 PM   Specimen: BLOOD  Result Value Ref Range Status   Specimen Description BLOOD RIGHT ANTECUBITAL  Final   Special Requests   Final    BOTTLES DRAWN AEROBIC AND ANAEROBIC Blood Culture adequate volume   Culture   Final    NO GROWTH 5 DAYS Performed at [facility], [place] [address], [place], [place] [address]    Report Status 12/18/2019 FINAL  Final  Blood culture (routine x 2)     Status: None   Collection Time: 12/13/19 11:27 PM   Specimen: BLOOD RIGHT HAND  Result Value Ref Range Status   Specimen Description BLOOD RIGHT HAND  Final   Special Requests   Final    BOTTLES DRAWN AEROBIC AND ANAEROBIC Blood Culture adequate volume   Culture   Final    NO GROWTH 5 DAYS Performed at [facility], [place] [address], [place], [place] [address]    Report Status 12/19/2019 FINAL  Final         Radiology Studies: No results found.      Scheduled Meds: . vitamin C  500 mg Oral Daily  . aspirin EC  81 mg Oral QHS  . atorvastatin  40 mg Oral q1800  . cholecalciferol  1,000 Units Oral Daily  . enoxaparin (LOVENOX) injection  55 mg Subcutaneous Q24H  . feeding supplement (GLUCERNA SHAKE)  237 mL Oral TID BM  . folic acid  1 mg Oral Daily  . insulin aspart  0-9 Units Subcutaneous TID WC  . insulin glargine  10 Units Subcutaneous Daily  . Ipratropium-Albuterol  1 puff Inhalation BID  . methylPREDNISolone  (SOLU-MEDROL) injection  60 mg Intravenous Q12H  . multivitamin with minerals  1 tablet Oral Daily  . pantoprazole  40 mg Oral Daily  . thiamine  100 mg Oral Daily  . zinc sulfate  220 mg Oral Daily   Continuous Infusions:   LOS: 7 days     Georgette Shell, MD  12/20/2019, 1:18 PM

## 2019-12-21 DIAGNOSIS — Z951 Presence of aortocoronary bypass graft: Secondary | ICD-10-CM

## 2019-12-21 LAB — CBC
HCT: 36.7 % — ABNORMAL LOW (ref 39.0–52.0)
Hemoglobin: 11.6 g/dL — ABNORMAL LOW (ref 13.0–17.0)
MCH: 27.9 pg (ref 26.0–34.0)
MCHC: 31.6 g/dL (ref 30.0–36.0)
MCV: 88.2 fL (ref 80.0–100.0)
Platelets: 190 10*3/uL (ref 150–400)
RBC: 4.16 MIL/uL — ABNORMAL LOW (ref 4.22–5.81)
RDW: 14.7 % (ref 11.5–15.5)
WBC: 7.3 10*3/uL (ref 4.0–10.5)
nRBC: 0 % (ref 0.0–0.2)

## 2019-12-21 LAB — COMPREHENSIVE METABOLIC PANEL
ALT: 60 U/L — ABNORMAL HIGH (ref 0–44)
AST: 30 U/L (ref 15–41)
Albumin: 2.5 g/dL — ABNORMAL LOW (ref 3.5–5.0)
Alkaline Phosphatase: 45 U/L (ref 38–126)
Anion gap: 10 (ref 5–15)
BUN: 60 mg/dL — ABNORMAL HIGH (ref 8–23)
CO2: 18 mmol/L — ABNORMAL LOW (ref 22–32)
Calcium: 8 mg/dL — ABNORMAL LOW (ref 8.9–10.3)
Chloride: 109 mmol/L (ref 98–111)
Creatinine, Ser: 1.23 mg/dL (ref 0.61–1.24)
GFR, Estimated: >60 mL/min (ref >60)
Glucose, Bld: 225 mg/dL — ABNORMAL HIGH (ref 70–99)
Potassium: 5.3 mmol/L — ABNORMAL HIGH (ref 3.5–5.1)
Sodium: 137 mmol/L (ref 135–145)
Total Bilirubin: 1 mg/dL (ref 0.3–1.2)
Total Protein: 5.6 g/dL — ABNORMAL LOW (ref 6.5–8.1)

## 2019-12-21 LAB — BRAIN NATRIURETIC PEPTIDE: B Natriuretic Peptide: 662.9 pg/mL — ABNORMAL HIGH (ref 0.0–100.0)

## 2019-12-21 LAB — GLUCOSE, CAPILLARY
Glucose-Capillary: 191 mg/dL — ABNORMAL HIGH (ref 70–99)
Glucose-Capillary: 153 mg/dL — ABNORMAL HIGH (ref 70–99)
Glucose-Capillary: 165 mg/dL — ABNORMAL HIGH (ref 70–99)

## 2019-12-21 MED ORDER — BISACODYL 5 MG PO TBEC
10.0000 mg | DELAYED_RELEASE_TABLET | Freq: Every day | ORAL | Status: DC
  Administered 2019-12-21 – 2020-01-03 (×12): 10 mg via ORAL
  Filled 2019-12-21 (×13): qty 2

## 2019-12-21 MED ORDER — POLYETHYLENE GLYCOL 3350 17 G PO PACK
17.0000 g | PACK | Freq: Every day | ORAL | Status: DC
  Administered 2019-12-21 – 2020-01-03 (×10): 17 g via ORAL
  Filled 2019-12-21 (×12): qty 1

## 2019-12-21 NOTE — Progress Notes (Signed)
PROGRESS NOTE    Frank Love  BJY:[id number] DOB: Feb 10, 1955 DOA: 12/13/2019 PCP: [name], NP   Brief Narrative:64 y.o.malewith medical history significant forchronic systolic CHF, ischemic cardiomyopathy with LVEF 35 to 40%, coronary artery disease status post CABG, penile cancer, carotid artery stenosis, who presented to [facility] via EMS after being found severely hypoxicathome.  Unfortunately patient is unvaccinated against Covid, his work-up was significant for COVID-19 of pneumonia and severe hypoxia for which he is admitted by [facility].  Assessment & Plan:   Active Problems:   Ischemic cardiomyopathy   S/P CABG x 4   Coronary artery disease   COVID-19  #1 acute hypoxic respiratory failure due to Covid pneumonia-his oxygen requirement has been improving and now he is down to 4 L from 20 L 2 days ago.  He has finished a course of remdesivir and Actemra.   Received Actemra on 11/13/2019.  We will continue IV steroids.  Solu-Medrol tapered down to twice a day 12/19/2019.  Inflammatory markers are improving. Encourage incentive spirometry flutter valve and out of bed. Encourage prone in bed at night. Labs pending for today.  Actemra/Baricitinib  off label use - patient was told that if COVID-19 pneumonitis gets worse we might potentially use Actemra off label, patient denies any known history of active diverticulitis, tuberculosis or hepatitis, understands the risks and benefits and wants to proceed with Actemra treatment if required.   #2 chronic systolic heart failure last echo in March 2021 with ejection fraction 35 to 40%.  Patient has had some episodes of bradycardia.  He was on digoxin Entresto and Aldactone and Coreg at home.   #3 steroid-induced hyperglycemia hemoglobin A1c 6.1 he has no history of type 2 diabetes.  Lantus started 10 units daily. CBG (last 3)  Recent Labs    12/20/19 1234 12/20/19 1732 12/20/19 2112  GLUCAP 219* 130* 173*     #4 history of  type CKD type II stable-creatinine trending down.  #5 history of CAD and CABG continue statin and home aspirin.  #6 history of essential hypertension currently blood pressure soft holding home meds.  Estimated body mass index is 32.27 kg/m as calculated from the following:   Height as of this encounter: 6\' 2"  (1.88 m).   Weight as of this encounter: 114 kg.  DVT prophylaxis: Lovenox twice a day  code Status: Full code Family Communication: Discussed with wife and daughter 12/21/2019 Disposition Plan:  Status is: Inpatient   Dispo:  Patient From: Home  planned Disposition: Home  expected discharge date within 3 days  medically stable for discharge: No   Consultants:   None  Procedures: None Antimicrobials: Anti-infectives (From admission, onward)   Start     Dose/Rate Route Frequency Ordered Stop   12/15/19 1000  remdesivir 100 mg in sodium chloride 0.9 % 100 mL IVPB       "Followed by" Linked Group Details   100 mg 200 mL/hr over 30 Minutes Intravenous Daily 12/14/19 0002 12/18/19 0849   12/14/19 1000  remdesivir 100 mg in sodium chloride 0.9 % 100 mL IVPB  Status:  Discontinued       "Followed by" Linked Group Details   100 mg 200 mL/hr over 30 Minutes Intravenous Daily 12/13/19 2358 12/14/19 0001   12/14/19 0030  remdesivir 100 mg in sodium chloride 0.9 % 100 mL IVPB       "Followed by" Linked Group Details   100 mg 200 mL/hr over 30 Minutes Intravenous Every 30 min 12/14/19  0002 12/14/19 0210   12/14/19 0000  remdesivir 200 mg in sodium chloride 0.9% 250 mL IVPB  Status:  Discontinued       "Followed by" Linked Group Details   200 mg 580 mL/hr over 30 Minutes Intravenous Once 12/13/19 2358 12/14/19 0001   12/13/19 2245  vancomycin (VANCOREADY) IVPB 2000 mg/400 mL        2,000 mg 200 mL/hr over 120 Minutes Intravenous  Once 12/13/19 2241 12/14/19 0210   12/13/19 2245  ceFEPIme (MAXIPIME) 2 g in sodium chloride 0.9 % 100 mL IVPB        2 g 200 mL/hr over 30  Minutes Intravenous  Once 12/13/19 2241 12/14/19 0002       Subjective: He was transferred to the chair for breakfast this morning.  He reports feeling better.  Had mild bleeding from his right nose yesterday which has been resolved.  Nasal spray has been ordered. Complains of constipation. Objective: Vitals:   12/20/19 2030 12/21/19 0017 12/21/19 0400 12/21/19 0740  BP:  (!) 97/47 115/70 124/68  Pulse: (!) 53 (!) 58 70 (!) 52  Resp: 16 14 19 19   Temp:  97.8 F (36.6 C) (!) 97.4 F (36.3 C) 98.2 F (36.8 C)  TempSrc:  Oral Oral Oral  SpO2: 100% 95% 99% 94%  Weight:   114 kg   Height:        Intake/Output Summary (Last 24 hours) at 12/21/2019 1133 Last data filed at 12/21/2019 0536 Gross per 24 hour  Intake 120 ml  Output 950 ml  Net -830 ml   Filed Weights   12/19/19 0407 12/20/19 0406 12/21/19 0400  Weight: 114.3 kg 114 kg 114 kg    Examination:  General exam: Appears calm and comfortable  Respiratory system: Coarse breath sounds to auscultation. Respiratory effort normal. Cardiovascular system: S1 & S2 heard, RRR. No JVD, murmurs, rubs, gallops or clicks. No pedal edema. Gastrointestinal system: Abdomen is nondistended, soft and nontender. No organomegaly or masses felt. Normal bowel sounds heard. Central nervous system: Alert and oriented. No focal neurological deficits. Extremities trace bilateral lower extremity edema. Skin: No rashes, lesions or ulcers Psychiatry: Judgement and insight appear normal. Mood & affect appropriate.     Data Reviewed: I have personally reviewed following labs and imaging studies  CBC: Recent Labs  Lab 12/15/19 0350 12/16/19 0708 12/17/19 0145 12/18/19 0040  WBC 3.4* 9.0 9.5 8.3  NEUTROABS 3.1 8.1* 8.6* 7.4  HGB 11.5* 11.2* 11.5* 11.2*  HCT 35.3* 34.4* 36.0* 35.3*  MCV 85.9 86.4 87.6 87.2  PLT 269 315 314 631   Basic Metabolic Panel: Recent Labs  Lab 12/15/19 0350 12/16/19 0708 12/17/19 0145 12/18/19 0040  NA 139  140 140 138  K 4.1 4.4 4.5 4.7  CL 114* 112* 112* 109  CO2 15* 19* 18* 18*  GLUCOSE 214* 234* 205* 224*  BUN 43* 56* 63* 65*  CREATININE 1.16 1.51* 1.45* 1.26*  CALCIUM 7.5* 7.6* 7.7* 7.7*  MG 3.8* 3.5* 3.4* 3.6*  PHOS 5.1* 4.2 4.5 3.8   GFR: Estimated Creatinine Clearance: 79.5 mL/min (A) (by C-G formula based on SCr of 1.26 mg/dL (H)). Liver Function Tests: Recent Labs  Lab 12/15/19 0350 12/16/19 0708 12/17/19 0145 12/18/19 0040  AST 47* 36 38 37  ALT 67* 59* 59* 55*  ALKPHOS 64 53 55 58  BILITOT 1.0 0.6 1.0 0.7  PROT 6.4* 6.1* 6.2* 5.7*  ALBUMIN 2.2* 2.4* 2.5* 2.4*   No results for input(s): LIPASE, AMYLASE  in the last 168 hours. No results for input(s): AMMONIA in the last 168 hours. Coagulation Profile: No results for input(s): INR, PROTIME in the last 168 hours. Cardiac Enzymes: No results for input(s): CKTOTAL, CKMB, CKMBINDEX, TROPONINI in the last 168 hours. BNP (last 3 results) No results for input(s): PROBNP in the last 8760 hours. HbA1C: No results for input(s): HGBA1C in the last 72 hours. CBG: Recent Labs  Lab 12/19/19 2008 12/20/19 0812 12/20/19 1234 12/20/19 1732 12/20/19 2112  GLUCAP 220* 152* 219* 130* 173*   Lipid Profile: No results for input(s): CHOL, HDL, LDLCALC, TRIG, CHOLHDL, LDLDIRECT in the last 72 hours. Thyroid Function Tests: No results for input(s): TSH, T4TOTAL, FREET4, T3FREE, THYROIDAB in the last 72 hours. Anemia Panel: No results for input(s): VITAMINB12, FOLATE, FERRITIN, TIBC, IRON, RETICCTPCT in the last 72 hours. Sepsis Labs: No results for input(s): PROCALCITON, LATICACIDVEN in the last 168 hours.  Recent Results (from the past 240 hour(s))  Respiratory Panel by RT PCR (Flu A&B, Covid) - Nasopharyngeal Swab     Status: Abnormal   Collection Time: 12/13/19  9:47 PM   Specimen: Nasopharyngeal Swab; Nasopharyngeal(NP) swabs in vial transport medium  Result Value Ref Range Status   SARS Coronavirus 2 by RT PCR POSITIVE  (A) NEGATIVE Final    Comment: RESULT CALLED TO, READ BACK BY AND VERIFIED WITH: E SULLIVAN RN 12/13/19 2330 JDW (NOTE) SARS-CoV-2 target nucleic acids are DETECTED.  SARS-CoV-2 RNA is generally detectable in upper respiratory specimens  during the acute phase of infection. Positive results are indicative of the presence of the identified virus, but do not rule out bacterial infection or co-infection with other pathogens not detected by the test. Clinical correlation with patient history and other diagnostic information is necessary to determine patient infection status. The expected result is Negative.  Fact Sheet for Patients:  PinkCheek.be  Fact Sheet for Healthcare Providers: GravelBags.it  This test is not yet approved or cleared by the [place] FDA and  has been authorized for detection and/or diagnosis of SARS-CoV-2 by FDA under an Emergency Use Authorization (EUA).  This EUA will remain in effect (meaning this test can be use d) for the duration of  the COVID-19 declaration under Section 564(b)(1) of the Act, 21 U.S.C. section 360bbb-3(b)(1), unless the authorization is terminated or revoked sooner.      Influenza A by PCR NEGATIVE NEGATIVE Final   Influenza B by PCR NEGATIVE NEGATIVE Final    Comment: (NOTE) The Xpert Xpress SARS-CoV-2/FLU/RSV assay is intended as an aid in  the diagnosis of influenza from Nasopharyngeal swab specimens and  should not be used as a sole basis for treatment. Nasal washings and  aspirates are unacceptable for Xpert Xpress SARS-CoV-2/FLU/RSV  testing.  Fact Sheet for Patients: PinkCheek.be  Fact Sheet for Healthcare Providers: GravelBags.it  This test is not yet approved or cleared by the [place] FDA and  has been authorized for detection and/or diagnosis of SARS-CoV-2 by  FDA under an Emergency Use  Authorization (EUA). This EUA will remain  in effect (meaning this test can be used) for the duration of the  Covid-19 declaration under Section 564(b)(1) of the Act, 21  U.S.C. section 360bbb-3(b)(1), unless the authorization is  terminated or revoked. Performed at [facility], [place] [address] [place], [place] [address]   Blood culture (routine x 2)     Status: None   Collection Time: 12/13/19 10:45 PM   Specimen: BLOOD  Result Value Ref Range  Status   Specimen Description BLOOD RIGHT ANTECUBITAL  Final   Special Requests   Final    BOTTLES DRAWN AEROBIC AND ANAEROBIC Blood Culture adequate volume   Culture   Final    NO GROWTH 5 DAYS Performed at [facility], [address] [place], [place] [address]    Report Status 12/18/2019 FINAL  Final  Blood culture (routine x 2)     Status: None   Collection Time: 12/13/19 11:27 PM   Specimen: BLOOD RIGHT HAND  Result Value Ref Range Status   Specimen Description BLOOD RIGHT HAND  Final   Special Requests   Final    BOTTLES DRAWN AEROBIC AND ANAEROBIC Blood Culture adequate volume   Culture   Final    NO GROWTH 5 DAYS Performed at [facility], [place] [address], [place], [place] [address]    Report Status 12/19/2019 FINAL  Final         Radiology Studies: No results found.      Scheduled Meds: . vitamin C  500 mg Oral Daily  . aspirin EC  81 mg Oral QHS  . atorvastatin  40 mg Oral q1800  . cholecalciferol  1,000 Units Oral Daily  . enoxaparin (LOVENOX) injection  55 mg Subcutaneous Q24H  . feeding supplement (GLUCERNA SHAKE)  237 mL Oral TID BM  . folic acid  1 mg Oral Daily  . insulin aspart  0-9 Units Subcutaneous TID WC  . insulin glargine  10 Units Subcutaneous Daily  . Ipratropium-Albuterol  1 puff Inhalation BID  . methylPREDNISolone (SOLU-MEDROL) injection  60 mg Intravenous Q12H  . multivitamin with minerals  1 tablet Oral Daily  . pantoprazole  40 mg Oral Daily  . thiamine  100 mg Oral  Daily  . zinc sulfate  220 mg Oral Daily   Continuous Infusions:   LOS: 8 days     Georgette Shell, MD  12/21/2019, 11:33 AM

## 2019-12-21 NOTE — Progress Notes (Signed)
Physical Therapy Treatment Patient Details Name: Frank Love MRN: [id number] DOB: [birth year] Today's Date: 12/21/2019    History of Present Illness 64 y.o. male with medical history significant for chronic systolic CHF, ischemic cardiomyopathy with LVEF 35 to 40%, coronary artery disease status post CABG, penile cancer, carotid artery stenosis, who presented to [facility] via EMS after being found severely hypoxic at home. In the ED, he required nonrebreather and high flow nasal cannula to maintain O2 saturation greater than 92%.  Work-up in the ED revealed severe acute hypoxic respiratory failure secondary to COVID-19 viral pneumonia. Unvaccinated. Admitted 12/13/19    PT Comments    Patient improving each day! Able to ambulate 50 ft twice (seated rest between) on 4L of oxygen with sats >90% first walk and >87% second walk. Patient requires continuous cues for safe use of RW.   Follow Up Recommendations  Home health PT;Supervision/Assistance - 24 hour;Supervision - Intermittent     Equipment Recommendations  Rolling walker with 5" wheels;3in1 (PT)    Recommendations for Other Services       Precautions / Restrictions      Mobility  Bed Mobility                  Transfers Overall transfer level: Needs assistance Equipment used: Rolling walker (2 wheeled) Transfers: Sit to/from Stand;Stand Pivot Transfers Sit to Stand: Min guard         General transfer comment: pt required cues for hand placement and min guard for safety and line mgmt   Ambulation/Gait Ambulation/Gait assistance: Min assist Gait Distance (Feet): 50 Feet (seated rest, 50) Assistive device: Rolling walker (2 wheeled) Gait Pattern/deviations: Step-through pattern;Decreased stride length;Trunk flexed Gait velocity: decr   General Gait Details: vc for proximity to RW and for avoiding obstacles in tight spaces in his room   Stairs             Wheelchair Mobility    Modified Rankin  (Stroke Patients Only)       Balance Overall balance assessment: Needs assistance   Sitting balance-Leahy Scale: Good     Standing balance support: Bilateral upper extremity supported Standing balance-Leahy Scale: Poor Standing balance comment: reliant on BUE support                            Cognition Arousal/Alertness: Awake/alert Behavior During Therapy: WFL for tasks assessed/performed Overall Cognitive Status: Within Functional Limits for tasks assessed                                        Exercises      General Comments General comments (skin integrity, edema, etc.): pt OOB on arrival; reports he is doing his IS and flutter valve each hour, multiple times      Pertinent Vitals/Pain Pain Assessment: No/denies pain    Home Living                      Prior Function            PT Goals (current goals can now be found in the care plan section) Acute Rehab PT Goals Patient Stated Goal: to see his wife Time For Goal Achievement: 12/29/19 Potential to Achieve Goals: Fair Progress towards PT goals: Progressing toward goals    Frequency    Min 3X/week  PT Plan Current plan remains appropriate    Co-evaluation              AM-PAC PT "6 Clicks" Mobility   Outcome Measure  Help needed turning from your back to your side while in a flat bed without using bedrails?: A Little Help needed moving from lying on your back to sitting on the side of a flat bed without using bedrails?: A Little Help needed moving to and from a bed to a chair (including a wheelchair)?: A Little Help needed standing up from a chair using your arms (e.g., wheelchair or bedside chair)?: A Little Help needed to walk in hospital room?: A Lot Help needed climbing 3-5 steps with a railing? : Total 6 Click Score: 15    End of Session Equipment Utilized During Treatment: Oxygen Activity Tolerance: Patient tolerated treatment well Patient  left: in chair;with call bell/phone within reach Nurse Communication: Mobility status PT Visit Diagnosis: Other abnormalities of gait and mobility (R26.89);Muscle weakness (generalized) (M62.81);Difficulty in walking, not elsewhere classified (R26.2)     Time: 7408-1448 PT Time Calculation (min) (ACUTE ONLY): 27 min  Charges:  $Gait Training: 23-37 mins                      [name], PT Pager [phone]    [name] 12/21/2019, 11:28 AM

## 2019-12-21 NOTE — Progress Notes (Signed)
Physical Therapy Treatment Patient Details Name: Frank Love MRN: [id number] DOB: 1955/10/28 Today's Date: 12/21/2019    History of Present Illness 64 y.o. male with medical history significant for chronic systolic CHF, ischemic cardiomyopathy with LVEF 35 to 40%, coronary artery disease status post CABG, penile cancer, carotid artery stenosis, who presented to [facility] via EMS after being found severely hypoxic at home. In the ED, he required nonrebreather and high flow nasal cannula to maintain O2 saturation greater than 92%.  Work-up in the ED revealed severe acute hypoxic respiratory failure secondary to COVID-19 viral pneumonia. Unvaccinated. Admitted 12/13/19    PT Comments    Patient calling for assist to walk to bathroom with some urgency in his voice. No other staff responding/available therefore provided a second session of gait training to/from bathroom. Despite recent education on safe use of RW, pt required the same cues for safe use. (? Distracted by urgency of needing to use the restroom). Again required 4L oxygen and sats >=92% throughout session.     Follow Up Recommendations  Home health PT;Supervision/Assistance - 24 hour;Supervision - Intermittent     Equipment Recommendations  Rolling walker with 5" wheels;3in1 (PT)    Recommendations for Other Services       Precautions / Restrictions      Mobility  Bed Mobility                  Transfers Overall transfer level: Needs assistance Equipment used: Rolling walker (2 wheeled) Transfers: Sit to/from Stand Sit to Stand: Min guard;Min assist         General transfer comment: pt required cues for hand placement; min guard for safety from recliner and min assist from standard toilet with grab bar  Ambulation/Gait Ambulation/Gait assistance: Min assist Gait Distance (Feet): 25 Feet (toileted, 25 ft) Assistive device: Rolling walker (2 wheeled) Gait Pattern/deviations: Step-through pattern;Decreased  stride length;Trunk flexed Gait velocity: decr   General Gait Details: vc to slow down and attend to objects in his path; vc for proximity to [employer]    Modified Rankin (Stroke Patients Only)       Balance Overall balance assessment: Needs assistance   Sitting balance-Leahy Scale: Good     Standing balance support: Bilateral upper extremity supported Standing balance-Leahy Scale: Poor Standing balance comment: reliant on BUE support                            Cognition Arousal/Alertness: Awake/alert Behavior During Therapy: WFL for tasks assessed/performed Overall Cognitive Status: Within Functional Limits for tasks assessed                                        Exercises      General Comments General comments (skin integrity, edema, etc.): Patient calling for help to go to the bathroom with other staff attending to other patients      Pertinent Vitals/Pain Pain Assessment: No/denies pain    Home Living                      Prior Function            PT Goals (current goals can now be found in the care plan section) Acute Rehab PT Goals Patient Stated  Goal: to see his wife Time For Goal Achievement: 12/29/19 Potential to Achieve Goals: Fair Progress towards PT goals: Progressing toward goals    Frequency    Min 3X/week      PT Plan Current plan remains appropriate    Co-evaluation              AM-PAC PT "6 Clicks" Mobility   Outcome Measure  Help needed turning from your back to your side while in a flat bed without using bedrails?: A Little Help needed moving from lying on your back to sitting on the side of a flat bed without using bedrails?: A Little Help needed moving to and from a bed to a chair (including a wheelchair)?: A Little Help needed standing up from a chair using your arms (e.g., wheelchair or bedside chair)?: A Little Help needed to walk in  hospital room?: A Lot Help needed climbing 3-5 steps with a railing? : Total 6 Click Score: 15    End of Session Equipment Utilized During Treatment: Oxygen Activity Tolerance: Patient tolerated treatment well Patient left: in chair;with call bell/phone within reach Nurse Communication: Mobility status;Other (comment) (pt asking for something for his bowels/constipation) PT Visit Diagnosis: Other abnormalities of gait and mobility (R26.89);Muscle weakness (generalized) (M62.81);Difficulty in walking, not elsewhere classified (R26.2)     Time: 9532-0233 PT Time Calculation (min) (ACUTE ONLY): 11 min  Charges:  $Gait Training: 8-22 mins                      [name], PT Pager [phone]    [name] 12/21/2019, 11:50 AM

## 2019-12-21 NOTE — Plan of Care (Signed)
  Problem: Education: Goal: Knowledge of risk factors and measures for prevention of condition will improve Outcome: Progressing   Problem: Respiratory: Goal: Will maintain a patent airway Outcome: Progressing   Problem: Clinical Measurements: Goal: Respiratory complications will improve Outcome: Progressing

## 2019-12-22 ENCOUNTER — Encounter (HOSPITAL_COMMUNITY): Payer: Self-pay | Admitting: Internal Medicine

## 2019-12-22 LAB — GLUCOSE, CAPILLARY
Glucose-Capillary: 144 mg/dL — ABNORMAL HIGH (ref 70–99)
Glucose-Capillary: 171 mg/dL — ABNORMAL HIGH (ref 70–99)
Glucose-Capillary: 138 mg/dL — ABNORMAL HIGH (ref 70–99)
Glucose-Capillary: 135 mg/dL — ABNORMAL HIGH (ref 70–99)

## 2019-12-22 LAB — COMPREHENSIVE METABOLIC PANEL
ALT: 55 U/L — ABNORMAL HIGH (ref 0–44)
AST: 25 U/L (ref 15–41)
Albumin: 2.4 g/dL — ABNORMAL LOW (ref 3.5–5.0)
Alkaline Phosphatase: 39 U/L (ref 38–126)
Anion gap: 8 (ref 5–15)
BUN: 56 mg/dL — ABNORMAL HIGH (ref 8–23)
CO2: 22 mmol/L (ref 22–32)
Calcium: 8 mg/dL — ABNORMAL LOW (ref 8.9–10.3)
Chloride: 109 mmol/L (ref 98–111)
Creatinine, Ser: 1.19 mg/dL (ref 0.61–1.24)
GFR, Estimated: >60 mL/min (ref >60)
Glucose, Bld: 170 mg/dL — ABNORMAL HIGH (ref 70–99)
Potassium: 5.9 mmol/L — ABNORMAL HIGH (ref 3.5–5.1)
Sodium: 139 mmol/L (ref 135–145)
Total Bilirubin: 1.1 mg/dL (ref 0.3–1.2)
Total Protein: 5.3 g/dL — ABNORMAL LOW (ref 6.5–8.1)

## 2019-12-22 LAB — CBC
HCT: 33.8 % — ABNORMAL LOW (ref 39.0–52.0)
Hemoglobin: 11 g/dL — ABNORMAL LOW (ref 13.0–17.0)
MCH: 28.1 pg (ref 26.0–34.0)
MCHC: 32.5 g/dL (ref 30.0–36.0)
MCV: 86.2 fL (ref 80.0–100.0)
Platelets: 174 10*3/uL (ref 150–400)
RBC: 3.92 MIL/uL — ABNORMAL LOW (ref 4.22–5.81)
RDW: 14.7 % (ref 11.5–15.5)
WBC: 6.6 10*3/uL (ref 4.0–10.5)
nRBC: 0 % (ref 0.0–0.2)

## 2019-12-22 MED ORDER — SODIUM POLYSTYRENE SULFONATE 15 GM/60ML PO SUSP
30.0000 g | Freq: Once | ORAL | Status: AC
  Administered 2019-12-22: 30 g via ORAL
  Filled 2019-12-22: qty 120

## 2019-12-22 MED ORDER — SODIUM ZIRCONIUM CYCLOSILICATE 10 G PO PACK
10.0000 g | PACK | Freq: Two times a day (BID) | ORAL | Status: AC
  Administered 2019-12-22 (×2): 10 g via ORAL
  Filled 2019-12-22 (×2): qty 1

## 2019-12-22 MED ORDER — METHYLPREDNISOLONE SODIUM SUCC 40 MG IJ SOLR
40.0000 mg | Freq: Every day | INTRAMUSCULAR | Status: DC
  Administered 2019-12-23 – 2019-12-24 (×2): 40 mg via INTRAVENOUS
  Filled 2019-12-22 (×2): qty 1

## 2019-12-22 MED ORDER — CARVEDILOL 3.125 MG PO TABS
3.1250 mg | ORAL_TABLET | Freq: Two times a day (BID) | ORAL | Status: DC
  Administered 2019-12-22: 3.125 mg via ORAL
  Filled 2019-12-22 (×2): qty 1

## 2019-12-22 MED ORDER — INSULIN GLARGINE 100 UNIT/ML ~~LOC~~ SOLN
6.0000 [IU] | Freq: Every day | SUBCUTANEOUS | Status: DC
  Administered 2019-12-22 – 2020-01-01 (×11): 6 [IU] via SUBCUTANEOUS
  Filled 2019-12-22 (×11): qty 0.06

## 2019-12-22 NOTE — Progress Notes (Signed)
SATURATION QUALIFICATIONS: (This note is used to comply with regulatory documentation for home oxygen)  Patient Saturations on Room Air at Rest = 95%  Patient Saturations on Room Air while Ambulating = 85%  Patient Saturations on 1 Liters of oxygen while Ambulating = 90%

## 2019-12-22 NOTE — TOC Progression Note (Signed)
Transition of Care [facility]) - Progression Note    Patient Details  Name: Frank Love MRN: [id number] Date of Birth: [birth year]  Transition of Care [facility]) CM/SW Contact  Pollie Friar, RN Phone Number: 12/22/2019, 3:29 PM  Clinical Narrative:    Pt qualifies for home oxygen. He is without insurance. CM called in to [facility] under charity services. Walker also requested.  [facility] services are full and unable to accept anyone else today or tomorrow. TOC following for further d/c needs.   Expected Discharge Plan: Home/Self Care Barriers to Discharge: Continued Medical Work up, Inadequate or no insurance  Expected Discharge Plan and Services Expected Discharge Plan: Home/Self Care   Discharge Planning Services: CM Consult, Medication Assistance                     DME Arranged: Walker rolling, Oxygen DME Agency: [facility] Date DME Agency Contacted: 12/22/19   Representative spoke with at DME Agency: [employer]--charity             Social Determinants of Health ([place]) Interventions    Readmission Risk Interventions Readmission Risk Prevention Plan 12/21/2018  Transportation Screening (No Data)  Some recent data might be hidden

## 2019-12-22 NOTE — Plan of Care (Signed)
Patient is currently resting in bed. OOB to [facility]. Voiding. VSS. Remains on 4L [place]. Call bell within reach.   Problem: Education: Goal: Knowledge of risk factors and measures for prevention of condition will improve Outcome: Progressing   Problem: Respiratory: Goal: Will maintain a patent airway Outcome: Progressing Goal: Complications related to the disease process, condition or treatment will be avoided or minimized Outcome: Progressing   Problem: Education: Goal: Knowledge of General Education information will improve Description: Including pain rating scale, medication(s)/side effects and non-pharmacologic comfort measures Outcome: Progressing   Problem: Health Behavior/Discharge Planning: Goal: Ability to manage health-related needs will improve Outcome: Progressing   Problem: Clinical Measurements: Goal: Ability to maintain clinical measurements within normal limits will improve Outcome: Progressing Goal: Will remain free from infection Outcome: Progressing Goal: Diagnostic test results will improve Outcome: Progressing Goal: Respiratory complications will improve Outcome: Progressing Goal: Cardiovascular complication will be avoided Outcome: Progressing   Problem: Activity: Goal: Risk for activity intolerance will decrease Outcome: Progressing   Problem: Nutrition: Goal: Adequate nutrition will be maintained Outcome: Progressing   Problem: Coping: Goal: Level of anxiety will decrease Outcome: Progressing   Problem: Elimination: Goal: Will not experience complications related to bowel motility Outcome: Progressing Goal: Will not experience complications related to urinary retention Outcome: Progressing   Problem: Pain Managment: Goal: General experience of comfort will improve Outcome: Progressing   Problem: Safety: Goal: Ability to remain free from injury will improve Outcome: Progressing   Problem: Skin Integrity: Goal: Risk for impaired skin integrity  will decrease Outcome: Progressing

## 2019-12-22 NOTE — Progress Notes (Addendum)
PROGRESS NOTE                                                                                                                                                                                                             Patient Demographics:    Frank Love, is a 64 y.o. male, DOB - [birth year], ACZ:[id number]  Admit date - 12/13/2019   Admitting Physician Kayleen Memos, DO  Outpatient Primary MD for the patient is [name], NP  LOS - 9  Chief Complaint  Patient presents with  . Covid Exposure  . Shortness of Breath  . generalized weakness       Brief Narrative (HPI from H&P) - 64 y.o.malewith medical history significant forchronic systolic CHF, ischemic cardiomyopathy with LVEF 35 to 40%, coronary artery disease status post CABG, penile cancer, carotid artery stenosis, who presented to [facility] ([place] via EMS after being found severely hypoxicathome.Unfortunately patient is unvaccinated against Covid, his work-up was significant for COVID-19 of pneumonia and severe hypoxia for which he is admitted by [facility].   Subjective:    Frank Love today has, No headache, No chest pain, No abdominal pain - No Nausea, No new weakness tingling or numbness, No Cough - SOB.     Assessment  & Plan :     1.  Acute hypoxic respiratory failure due to COVID-19 pneumonia.  He is unvaccinated and incurred severe parenchymal lung injury, he was initially requiring between 20 to 25 L of oxygen.  He was treated with remdesivir, IV steroids and Actemra.  He has shown good clinical improvement.  Continue to advance activity and titrate down oxygen.  Will monitor closely.    Recent Labs  Lab 12/16/19 0708 12/17/19 0145 12/18/19 0040 12/19/19 0023 12/21/19 1406 12/22/19 0235  WBC 9.0 9.5 8.3  --  7.3 6.6  HGB 11.2* 11.5* 11.2*  --  11.6* 11.0*  HCT 34.4* 36.0* 35.3*  --  36.7* 33.8*  PLT 315 314 283  --  190 174  CRP 2.4* 1.4* 1.0*  --   --   --   BNP 560.0* 491.8* 347.8* 365.7*  662.9*  --   DDIMER 1.95* 1.78* 1.51*  --   --   --   AST 36 38 37  --  30 25  ALT 59* 59* 55*  --  60* 55*  ALKPHOS 53 55 58  --  45 39  BILITOT 0.6 1.0 0.7  --  1.0 1.1  ALBUMIN 2.4* 2.5* 2.4*  --  2.5* 2.4*     2.  Chronic combined systolic and diastolic heart failure recent echocardiogram shows EF of 35%.  He is currently compensated.  Low-dose Coreg, hold Entresto and digoxin.  Had bradycardia and blood pressure is soft.  3.  Dyslipidemia.  On statin.  4.  CAD s/p CABG.  On aspirin and statin combination, low-dose beta-blocker if tolerated by blood pressure and heart rate.  5.  Hyperkalemia.  Treated will monitor.    Condition - Extremely Guarded  Family Communication  :  Wife in the hospital  Code Status :  Full  Consults  :  None  Procedures  :    Leg [place] - No DVT  PUD Prophylaxis : PPI  Disposition Plan  :    Status is: Inpatient  Remains inpatient appropriate because:IV treatments appropriate due to intensity of illness or inability to take PO   Dispo:  Patient From: Home  Planned Disposition: Home  Expected discharge date: 12/23/19  Medically stable for discharge: No   DVT Prophylaxis  :  Lovenox   Lab Results  Component Value Date   PLT 174 12/22/2019    Diet :  Diet Order            Diet heart healthy/carb modified Room service appropriate? Yes; Fluid consistency: Thin  Diet effective now                  Inpatient Medications Scheduled Meds: . vitamin C  500 mg Oral Daily  . aspirin EC  81 mg Oral QHS  . atorvastatin  40 mg Oral q1800  . bisacodyl  10 mg Oral Daily  . cholecalciferol  1,000 Units Oral Daily  . enoxaparin (LOVENOX) injection  55 mg Subcutaneous Q24H  . feeding supplement (GLUCERNA SHAKE)  237 mL Oral TID BM  . folic acid  1 mg Oral Daily  . insulin aspart  0-9 Units Subcutaneous TID WC  . insulin glargine  6 Units Subcutaneous Daily  . Ipratropium-Albuterol  1 puff Inhalation BID  . [START ON 12/23/2019]  methylPREDNISolone (SOLU-MEDROL) injection  40 mg Intravenous Daily  . multivitamin with minerals  1 tablet Oral Daily  . pantoprazole  40 mg Oral Daily  . polyethylene glycol  17 g Oral Daily  . sodium polystyrene  30 g Oral Once  . sodium zirconium cyclosilicate  10 g Oral BID  . thiamine  100 mg Oral Daily  . zinc sulfate  220 mg Oral Daily   Continuous Infusions: PRN Meds:.acetaminophen, chlorpheniramine-HYDROcodone, ondansetron (ZOFRAN) IV, sodium chloride  Antibiotics  :   Anti-infectives (From admission, onward)   Start     Dose/Rate Route Frequency Ordered Stop   12/15/19 1000  remdesivir 100 mg in sodium chloride 0.9 % 100 mL IVPB       "Followed by" Linked Group Details   100 mg 200 mL/hr over 30 Minutes Intravenous Daily 12/14/19 0002 12/18/19 0849   12/14/19 1000  remdesivir 100 mg in sodium chloride 0.9 % 100 mL IVPB  Status:  Discontinued       "Followed by" Linked Group Details   100 mg 200 mL/hr over 30 Minutes Intravenous Daily 12/13/19 2358 12/14/19 0001   12/14/19 0030  remdesivir 100 mg in sodium chloride 0.9 % 100 mL IVPB       "Followed by" Linked Group Details   100 mg 200 mL/hr over 30 Minutes Intravenous Every 30 min 12/14/19 0002 12/14/19 0210   12/14/19 0000  remdesivir 200 mg  in sodium chloride 0.9% 250 mL IVPB  Status:  Discontinued       "Followed by" Linked Group Details   200 mg 580 mL/hr over 30 Minutes Intravenous Once 12/13/19 2358 12/14/19 0001   12/13/19 2245  vancomycin (VANCOREADY) IVPB 2000 mg/400 mL        2,000 mg 200 mL/hr over 120 Minutes Intravenous  Once 12/13/19 2241 12/14/19 0210   12/13/19 2245  ceFEPIme (MAXIPIME) 2 g in sodium chloride 0.9 % 100 mL IVPB        2 g 200 mL/hr over 30 Minutes Intravenous  Once 12/13/19 2241 12/14/19 0002          Objective:   Vitals:   12/21/19 2042 12/21/19 2333 12/22/19 0335 12/22/19 0800  BP:  109/63 (!) 98/59 105/73  Pulse: 61 (!) 54 67 (!) 55  Resp: (!) 21 17 20 19   Temp:  (!) 97.5  F (36.4 C) 97.9 F (36.6 C) 98.1 F (36.7 C)  TempSrc:  Oral Oral Oral  SpO2: 93% 95% 95% 95%  Weight:   113.1 kg   Height:        SpO2: 95 % O2 Flow Rate (L/min): 4 L/min FiO2 (%): 36 %  Wt Readings from Last 3 Encounters:  12/22/19 113.1 kg  09/10/19 114.8 kg  08/13/19 114.8 kg    No intake or output data in the 24 hours ending 12/22/19 0917   Physical Exam  Awake Alert, No new F.N deficits, Normal affect [place].AT,PERRAL Supple Neck,No JVD, No cervical lymphadenopathy appriciated.  Symmetrical Chest wall movement, Good air movement bilaterally, CTAB RRR,No Gallops,Rubs or new Murmurs, No Parasternal Heave +ve B.Sounds, Abd Soft, No tenderness, No organomegaly appriciated, No rebound - guarding or rigidity. No Cyanosis, Clubbing or edema, No new Rash or bruise    Data Review:   Recent Labs  Lab 12/16/19 0708 12/17/19 0145 12/18/19 0040 12/21/19 1406 12/22/19 0235  WBC 9.0 9.5 8.3 7.3 6.6  HGB 11.2* 11.5* 11.2* 11.6* 11.0*  HCT 34.4* 36.0* 35.3* 36.7* 33.8*  PLT 315 314 283 190 174  MCV 86.4 87.6 87.2 88.2 86.2  MCH 28.1 28.0 27.7 27.9 28.1  MCHC 32.6 31.9 31.7 31.6 32.5  RDW 14.7 14.8 14.8 14.7 14.7  LYMPHSABS 0.4* 0.4* 0.4*  --   --   MONOABS 0.3 0.4 0.3  --   --   EOSABS 0.0 0.0 0.0  --   --   BASOSABS 0.0 0.0 0.0  --   --     Recent Labs  Lab 12/16/19 0708 12/17/19 0145 12/18/19 0040 12/19/19 0023 12/21/19 1406 12/22/19 0235  NA 140 140 138  --  137 139  K 4.4 4.5 4.7  --  5.3* 5.9*  CL 112* 112* 109  --  109 109  CO2 19* 18* 18*  --  18* 22  GLUCOSE 234* 205* 224*  --  225* 170*  BUN 56* 63* 65*  --  60* 56*  CREATININE 1.51* 1.45* 1.26*  --  1.23 1.19  CALCIUM 7.6* 7.7* 7.7*  --  8.0* 8.0*  AST 36 38 37  --  30 25  ALT 59* 59* 55*  --  60* 55*  ALKPHOS 53 55 58  --  45 39  BILITOT 0.6 1.0 0.7  --  1.0 1.1  ALBUMIN 2.4* 2.5* 2.4*  --  2.5* 2.4*  MG 3.5* 3.4* 3.6*  --   --   --   CRP 2.4* 1.4* 1.0*  --   --   --  DDIMER 1.95* 1.78*  1.51*  --   --   --   BNP 560.0* 491.8* 347.8* 365.7* 662.9*  --     Recent Labs  Lab 12/16/19 0708 12/17/19 0145 12/18/19 0040 12/19/19 0023 12/21/19 1406  CRP 2.4* 1.4* 1.0*  --   --   DDIMER 1.95* 1.78* 1.51*  --   --   BNP 560.0* 491.8* 347.8* 365.7* 662.9*    ------------------------------------------------------------------------------------------------------------------ No results for input(s): CHOL, HDL, LDLCALC, TRIG, CHOLHDL, LDLDIRECT in the last 72 hours.  Lab Results  Component Value Date   HGBA1C 6.1 (H) 12/14/2019   ------------------------------------------------------------------------------------------------------------------ No results for input(s): TSH, T4TOTAL, T3FREE, THYROIDAB in the last 72 hours.  Invalid input(s): FREET3 ------------------------------------------------------------------------------------------------------------------ No results for input(s): VITAMINB12, FOLATE, FERRITIN, TIBC, IRON, RETICCTPCT in the last 72 hours.  Coagulation profile No results for input(s): INR, PROTIME in the last 168 hours.  No results for input(s): DDIMER in the last 72 hours.  Cardiac Enzymes No results for input(s): CKMB, TROPONINI, MYOGLOBIN in the last 168 hours.  Invalid input(s): CK ------------------------------------------------------------------------------------------------------------------    Component Value Date/Time   BNP 662.9 (H) 12/21/2019 1406    Micro Results Recent Results (from the past 240 hour(s))  Respiratory Panel by RT PCR (Flu A&B, Covid) - Nasopharyngeal Swab     Status: Abnormal   Collection Time: 12/13/19  9:47 PM   Specimen: Nasopharyngeal Swab; Nasopharyngeal(NP) swabs in vial transport medium  Result Value Ref Range Status   SARS Coronavirus 2 by RT PCR POSITIVE (A) NEGATIVE Final    Comment: RESULT CALLED TO, READ BACK BY AND VERIFIED WITH: E SULLIVAN RN 12/13/19 2330 JDW (NOTE) SARS-CoV-2 target nucleic acids are  DETECTED.  SARS-CoV-2 RNA is generally detectable in upper respiratory specimens  during the acute phase of infection. Positive results are indicative of the presence of the identified virus, but do not rule out bacterial infection or co-infection with other pathogens not detected by the test. Clinical correlation with patient history and other diagnostic information is necessary to determine patient infection status. The expected result is Negative.  Fact Sheet for Patients:  PinkCheek.be  Fact Sheet for Healthcare Providers: GravelBags.it  This test is not yet approved or cleared by the [place] FDA and  has been authorized for detection and/or diagnosis of SARS-CoV-2 by FDA under an Emergency Use Authorization (EUA).  This EUA will remain in effect (meaning this test can be use d) for the duration of  the COVID-19 declaration under Section 564(b)(1) of the Act, 21 U.S.C. section 360bbb-3(b)(1), unless the authorization is terminated or revoked sooner.      Influenza A by PCR NEGATIVE NEGATIVE Final   Influenza B by PCR NEGATIVE NEGATIVE Final    Comment: (NOTE) The Xpert Xpress SARS-CoV-2/FLU/RSV assay is intended as an aid in  the diagnosis of influenza from Nasopharyngeal swab specimens and  should not be used as a sole basis for treatment. Nasal washings and  aspirates are unacceptable for Xpert Xpress SARS-CoV-2/FLU/RSV  testing.  Fact Sheet for Patients: PinkCheek.be  Fact Sheet for Healthcare Providers: GravelBags.it  This test is not yet approved or cleared by the [place] FDA and  has been authorized for detection and/or diagnosis of SARS-CoV-2 by  FDA under an Emergency Use Authorization (EUA). This EUA will remain  in effect (meaning this test can be used) for the duration of the  Covid-19 declaration under Section 564(b)(1) of the Act,  21  U.S.C. section 360bbb-3(b)(1), unless the authorization is  terminated  or revoked. Performed at [facility], [place] [address], [place], [place] [address]   Blood culture (routine x 2)     Status: None   Collection Time: 12/13/19 10:45 PM   Specimen: BLOOD  Result Value Ref Range Status   Specimen Description BLOOD RIGHT ANTECUBITAL  Final   Special Requests   Final    BOTTLES DRAWN AEROBIC AND ANAEROBIC Blood Culture adequate volume   Culture   Final    NO GROWTH 5 DAYS Performed at [facility], [place] [address], [place], [place] [address]    Report Status 12/18/2019 FINAL  Final  Blood culture (routine x 2)     Status: None   Collection Time: 12/13/19 11:27 PM   Specimen: BLOOD RIGHT HAND  Result Value Ref Range Status   Specimen Description BLOOD RIGHT HAND  Final   Special Requests   Final    BOTTLES DRAWN AEROBIC AND ANAEROBIC Blood Culture adequate volume   Culture   Final    NO GROWTH 5 DAYS Performed at [facility], [place] [address] [place], [place] [address]    Report Status 12/19/2019 FINAL  Final    Radiology Reports DG Chest Portable 1 View  Result Date: 12/13/2019 CLINICAL DATA:  Shortness of breath, suspicion of COVID EXAM: PORTABLE CHEST 1 VIEW COMPARISON:  Radiograph 01/07/2019, CT 11/20/2018 FINDINGS: Heterogeneous consolidative and hazy opacity is present in a lower lung and peripheral predominance most coalescent in the left lung periphery. Some more bandlike opacities, possibly subsegmental atelectatic changes or scarring. No pneumothorax. No visible effusion though the costophrenic sulci are partially collimated. Postsurgical changes related to prior CABG including intact and aligned sternotomy wires and multiple surgical clips projecting over the mediastinum. Telemetry leads overlie the chest. The aorta is calcified. The remaining cardiomediastinal contours are unremarkable. Degenerative changes are present in the imaged spine and  shoulders. IMPRESSION: 1. Heterogeneous consolidative and hazy opacity in a lower lung and peripheral predominance most coalescent in the left lung periphery, compatible with a multifocal pneumonia including atypical viral etiology in the appropriate clinical setting. 2. More bandlike opacities likely reflect subsegmental atelectatic changes or scarring. Electronically Signed   By: Lovena Le M.D.   On: 12/13/2019 22:28   VAS [place] LOWER EXTREMITY VENOUS (DVT)  Result Date: 12/15/2019  Lower Venous DVT Study Indications: Elevated ddimer.  Comparison Study: 07/09/18 previous Performing Technologist: Abram Sander RVS  Examination Guidelines: A complete evaluation includes B-mode imaging, spectral Doppler, color Doppler, and power Doppler as needed of all accessible portions of each vessel. Bilateral testing is considered an integral part of a complete examination. Limited examinations for reoccurring indications may be performed as noted. The reflux portion of the exam is performed with the patient in reverse Trendelenburg.  +---------+---------------+---------+-----------+----------+--------------+ RIGHT    CompressibilityPhasicitySpontaneityPropertiesThrombus Aging +---------+---------------+---------+-----------+----------+--------------+ CFV      Full           Yes      Yes                                 +---------+---------------+---------+-----------+----------+--------------+ SFJ      Full                                                        +---------+---------------+---------+-----------+----------+--------------+  FV Prox  Full                                                        +---------+---------------+---------+-----------+----------+--------------+ FV Mid   Full                                                        +---------+---------------+---------+-----------+----------+--------------+ FV DistalFull                                                         +---------+---------------+---------+-----------+----------+--------------+ PFV      Full                                                        +---------+---------------+---------+-----------+----------+--------------+ POP      Full           Yes      Yes                                 +---------+---------------+---------+-----------+----------+--------------+ PTV      Full                                                        +---------+---------------+---------+-----------+----------+--------------+ PERO     Full                                                        +---------+---------------+---------+-----------+----------+--------------+   +---------+---------------+---------+-----------+----------+--------------+ LEFT     CompressibilityPhasicitySpontaneityPropertiesThrombus Aging +---------+---------------+---------+-----------+----------+--------------+ CFV      Full           Yes      Yes                                 +---------+---------------+---------+-----------+----------+--------------+ SFJ      Full                                                        +---------+---------------+---------+-----------+----------+--------------+ FV Prox  Full                                                        +---------+---------------+---------+-----------+----------+--------------+  FV Mid   Full                                                        +---------+---------------+---------+-----------+----------+--------------+ FV DistalFull                                                        +---------+---------------+---------+-----------+----------+--------------+ PFV      Full                                                        +---------+---------------+---------+-----------+----------+--------------+ POP      Full           Yes      Yes                                  +---------+---------------+---------+-----------+----------+--------------+ PTV      Full                                                        +---------+---------------+---------+-----------+----------+--------------+ PERO     Full                                                        +---------+---------------+---------+-----------+----------+--------------+     Summary: BILATERAL: - No evidence of deep vein thrombosis seen in the lower extremities, bilaterally. - No evidence of superficial venous thrombosis in the lower extremities, bilaterally. -No evidence of popliteal cyst, bilaterally. RIGHT: - Findings appear essentially unchanged compared to previous examination.   *See table(s) above for measurements and observations. Electronically signed by Deitra Mayo MD on 12/15/2019 at 6:22:41 PM.    Final     Time Spent in minutes  30   Lala Lund M.D on 12/22/2019 at 9:17 AM  To page go to [email or web address] - password [facility]

## 2019-12-23 DIAGNOSIS — I5022 Chronic systolic (congestive) heart failure: Secondary | ICD-10-CM

## 2019-12-23 DIAGNOSIS — J9600 Acute respiratory failure, unspecified whether with hypoxia or hypercapnia: Secondary | ICD-10-CM

## 2019-12-23 DIAGNOSIS — R001 Bradycardia, unspecified: Secondary | ICD-10-CM

## 2019-12-23 DIAGNOSIS — N189 Chronic kidney disease, unspecified: Secondary | ICD-10-CM

## 2019-12-23 DIAGNOSIS — N179 Acute kidney failure, unspecified: Secondary | ICD-10-CM

## 2019-12-23 DIAGNOSIS — I959 Hypotension, unspecified: Secondary | ICD-10-CM

## 2019-12-23 LAB — BASIC METABOLIC PANEL
Anion gap: 7 (ref 5–15)
BUN: 53 mg/dL — ABNORMAL HIGH (ref 8–23)
CO2: 24 mmol/L (ref 22–32)
Calcium: 7.7 mg/dL — ABNORMAL LOW (ref 8.9–10.3)
Chloride: 108 mmol/L (ref 98–111)
Creatinine, Ser: 1.27 mg/dL — ABNORMAL HIGH (ref 0.61–1.24)
GFR, Estimated: >60 mL/min (ref >60)
Glucose, Bld: 100 mg/dL — ABNORMAL HIGH (ref 70–99)
Potassium: 4.4 mmol/L (ref 3.5–5.1)
Sodium: 139 mmol/L (ref 135–145)

## 2019-12-23 LAB — GLUCOSE, CAPILLARY
Glucose-Capillary: 130 mg/dL — ABNORMAL HIGH (ref 70–99)
Glucose-Capillary: 87 mg/dL (ref 70–99)
Glucose-Capillary: 96 mg/dL (ref 70–99)
Glucose-Capillary: 140 mg/dL — ABNORMAL HIGH (ref 70–99)
Glucose-Capillary: 135 mg/dL — ABNORMAL HIGH (ref 70–99)

## 2019-12-23 LAB — T4, FREE: Free T4: 1.36 ng/dL — ABNORMAL HIGH (ref 0.61–1.12)

## 2019-12-23 LAB — TSH: TSH: 0.059 u[IU]/mL — ABNORMAL LOW (ref 0.350–4.500)

## 2019-12-23 LAB — POTASSIUM: Potassium: 4.7 mmol/L (ref 3.5–5.1)

## 2019-12-23 LAB — MAGNESIUM: Magnesium: 2.5 mg/dL — ABNORMAL HIGH (ref 1.7–2.4)

## 2019-12-23 MED ORDER — ALBUTEROL SULFATE HFA 108 (90 BASE) MCG/ACT IN AERS: 2.0000 | INHALATION_SPRAY | Freq: Four times a day (QID) | RESPIRATORY_TRACT | 0 refills | Status: DC | PRN

## 2019-12-23 MED ORDER — CARVEDILOL 3.125 MG PO TABS
3.1250 mg | ORAL_TABLET | Freq: Two times a day (BID) | ORAL | Status: DC
  Administered 2019-12-23 – 2019-12-25 (×4): 3.125 mg via ORAL
  Filled 2019-12-23 (×4): qty 1

## 2019-12-23 MED ORDER — ENTRESTO 97-103 MG PO TABS
1.0000 | ORAL_TABLET | Freq: Two times a day (BID) | ORAL | 3 refills | Status: DC
Start: 2019-12-24 — End: 2020-01-03

## 2019-12-23 MED ORDER — SODIUM POLYSTYRENE SULFONATE 15 GM/60ML PO SUSP
30.0000 g | Freq: Once | ORAL | Status: AC
  Administered 2019-12-23: 30 g via ORAL
  Filled 2019-12-23: qty 120

## 2019-12-23 MED ORDER — CARVEDILOL 3.125 MG PO TABS: 3.1250 mg | ORAL_TABLET | Freq: Two times a day (BID) | ORAL | 0 refills | Status: DC

## 2019-12-23 MED ORDER — LACTATED RINGERS IV SOLN
INTRAVENOUS | Status: DC
Start: 2019-12-23 — End: 2019-12-23

## 2019-12-23 MED ORDER — MELATONIN 3 MG PO TABS
3.0000 mg | ORAL_TABLET | Freq: Every day | ORAL | Status: DC
  Administered 2019-12-23 – 2019-12-29 (×7): 3 mg via ORAL
  Filled 2019-12-23 (×7): qty 1

## 2019-12-23 MED ORDER — LACTATED RINGERS IV SOLN: INTRAVENOUS | Status: AC

## 2019-12-23 MED ORDER — MAGNESIUM HYDROXIDE 400 MG/5ML PO SUSP
30.0000 mL | Freq: Two times a day (BID) | ORAL | Status: AC
  Administered 2019-12-23 (×2): 30 mL via ORAL
  Filled 2019-12-23 (×2): qty 30

## 2019-12-23 MED FILL — PROVENTIL HFA 108 (90 BASE): 108 (90 BAS | 25 days supply | Qty: 7 | Fill #0

## 2019-12-23 MED FILL — ?CARVEDILOL 3.125 MG TABLET: 3.125 | 30 days supply | Qty: 60 | Fill #0

## 2019-12-23 NOTE — Consult Note (Addendum)
Advanced Heart Failure Team Consult Note   Primary Physician: Frank Pounds, NP PCP-Cardiologist:  Frank Breeding, MD  Reason for Consultation: Bradycardia and Hypotension   HPI:    Frank Love is 64 y.o. M PMH: HFrEF (35-40%) 2/2 ICM, CAD s/p CABG x 4, penile cancer, carotid artery stenosis presented to the [facility] on 11/20 from home via EMS for weakness, fatigue and severely hypoxic with O2 saturation in the 70s on room air.  He was not vaccinated for COVID-19 and found to be positive for COVID-19.  In the ED he required 15L non-rebreather overlying 15L HFNC to maintain O2 saturation of 92%. In addition, in the ED he was hypotensive with MAP 63 and bradycardic HR 50s.  He was admitted to [facility] hospitalist for acute hypoxic respiratory failure 2/2 COVID-19 Viral PNA. During his hospitalization, his oxygen requirements increased, he received Remdesivir x 5 days, Solu-medrol 125 TID x 5 days and tocilizumab 800mg /baricitnib 4 mg while self proning in bed. Negative doppler for DVT. On 11/21 it was noted that his digoxin was held due to sinus bradycardia but continued with coreg at reduced dose 3.125 mg BID.  His blood pressures were also soft which prompted holding entresto and aldactone and was found to be symptomatic with dizziness movement.  On 11/25, oxygen requirements improving, solu-medrol 125 BID x 3 days, and coreg discontinued due to bradycardia and low normotensive BPs.   He continued to improve with oxygen requirements and he is currently at 1L with rest and 1-2L with activity via nasal cannula.    Advanced Heart Failure consulted today for evaluation of hypotension and bradycardia at the request of Frank Love.   Today, [name] reports feeling nauseous today, especially when changing positions.  He denies chest pain or shortness of breath.  Reports always has had swelling to L foot since prior to heart failure diagnosis.  Denies pain to foot and doppler negative.    He  initially was seen by PCP in 5/20 with elevated BNP and was started on lasix with cardiology referral, lasix was subsequently increased to 40 mg bid due to ongoing LE edema. He underwent cardiac evaluation for right hip surgery in September with continued symptoms of LE edema on lasix 40 bid. Echo in 5/68 showedsystolic HFwith EF 12-75%.He was started on entresto, metoprolol at this time.  He underwent cath 11/20 which showedmultivessel disease, now s/p CABGx4 12/12/18.  Repeat echo done 12/17/18 showed severely decreased right ventricular systolic function with severe enlargement in addition to small pericardial effusion, otherwise similar to previous. VQ scan was negative for PE. Transferred to CIR for rehab.   He had right THR done in 1/70 with no complications.   Echo repeated 03/2019, EF 35-40%, mild LVH, mild LV dilation, moderately decreased RV systolic function, normal RV size.  Recommended cardiac MRI to quantify EF more exactly for purposes of ICD determination, patient adamant does not want ICD and declined cMRI.   OV in 3/21, he was volume overloaded. ReDs clip was 40%. He was started on Lasix 20 mg daily and Entresto was increased to 49/51 mg bid. He has seen PharmD on subsequent visits and has had further increase in Entresto to 97-103 BID and Farxiga 5 mg was added. ReDs Clip at that visit was 37%.   Also of note, he had a sleep study with mild OSA about 2 years ago but has lost a lot of weight since and does not snore and has not daytime sleepiness.  OV 7/21:  No exertional symptoms. NYHA Class II. His wt was up since his last visit w/ Dr. Aundra Love in March from 241>>253 lb, but he attributed this to eating more over the last several months.  ReDs clip mildly elevated at 38%. Compliant w/ medications.    Review of Systems: [y] = yes, [ ]  = no   . General: Weight gain [ ] ; Weight loss [ ] ; Anorexia [ ] ; Fatigue [ ] ; Fever [ ] ; Chills [ ] ; Weakness [ ]   . Cardiac: Chest  pain/pressure [ ] ; Resting SOB [ ] ; Exertional SOB [ ] ; Orthopnea [ ] ; Pedal Edema [ ] ; Palpitations [ ] ; Syncope [ ] ; Presyncope [ ] ; Paroxysmal nocturnal dyspnea[ ]   . Pulmonary: Cough [ ] ; Wheezing[ ] ; Hemoptysis[ ] ; Sputum [ ] ; Snoring [ ]   . GI: Vomiting[ ] ; Dysphagia[ ] ; Melena[ ] ; Hematochezia [ ] ; Heartburn[ ] ; Abdominal pain [ ] ; Constipation [ ] ; Diarrhea [ ] ; BRBPR [ ]   . GU: Hematuria[ ] ; Dysuria [ ] ; Nocturia[ ]   . Vascular: Pain in legs with walking [ ] ; Pain in feet with lying flat [ ] ; Non-healing sores [ ] ; Stroke [ ] ; TIA [ ] ; Slurred speech [ ] ;  . Neuro: Headaches[ ] ; Vertigo[ ] ; Seizures[ ] ; Paresthesias[ ] ;Blurred vision [ ] ; Diplopia [ ] ; Vision changes [ ]   . Ortho/Skin: Arthritis [ ] ; Joint pain [ ] ; Muscle pain [ ] ; Joint swelling [ ] ; Back Pain [ ] ; Rash [ ]   . Psych: Depression[ ] ; Anxiety[ ]   . Heme: Bleeding problems [ ] ; Clotting disorders [ ] ; Anemia [ ]   . Endocrine: Diabetes [ ] ; Thyroid dysfunction[ ]   Home Medications Prior to Admission medications   Medication Sig Start Date End Date Taking? Authorizing Provider  albuterol (VENTOLIN HFA) 108 (90 Base) MCG/ACT inhaler Inhale 2 puffs into the lungs every 6 (six) hours as needed for wheezing or shortness of breath. 12/23/19   Thurnell Lose, MD  aspirin EC 81 MG tablet Take 81 mg by mouth at bedtime.     [provider]  atorvastatin (LIPITOR) 40 MG tablet TAKE 1 TABLET (40 MG TOTAL) BY MOUTH DAILY AT 6 PM. Patient taking differently: Take 40 mg by mouth daily.  08/22/19   Frank Breeding, MD  carvedilol (COREG) 3.125 MG tablet Take 1 tablet (3.125 mg total) by mouth 2 (two) times daily with a meal. 12/23/19   Thurnell Lose, MD  carvedilol (COREG) 6.25 MG tablet Take 1 tablet (6.25 mg total) by mouth 2 (two) times daily with a meal. 03/18/19   Larey Dresser, MD  dapagliflozin propanediol (FARXIGA) 5 MG TABS tablet Take 5 mg by mouth every evening. 06/10/19   Bensimhon, Frank Pascal, MD  DIGITEK 125  MCG tablet TAKE 1 TABLET (0.125 MG TOTAL) BY MOUTH DAILY. Patient taking differently: Take 0.125 mg by mouth daily.  08/14/19   Frank Breeding, MD  furosemide (LASIX) 20 MG tablet Take 1 tablet (20 mg total) by mouth daily. 04/18/19 08/23/19  Larey Dresser, MD  nitroGLYCERIN (NITROSTAT) 0.4 MG SL tablet Place 1 tablet (0.4 mg total) under the tongue every 5 (five) minutes as needed for chest pain. NO more than 3 pills--if you have the need to take 2 pills or more call MD 12/28/18   Love, Ivan Anchors, PA-C  potassium chloride SA (KLOR-CON) 20 MEQ tablet Take 1 tablet (20 mEq total) by mouth daily. Takes while taking furosemide 05/13/19   Larey Dresser, MD  sacubitril-valsartan (  ENTRESTO) 97-103 MG Take 1 tablet by mouth 2 (two) times daily. 12/24/19   Thurnell Lose, MD  spironolactone (ALDACTONE) 25 MG tablet Take 1 tablet (25 mg total) by mouth daily. 04/23/19   Larey Dresser, MD    Past Medical History: Past Medical History:  Diagnosis Date  . Cancer ([place])    penile cancer  . Chronic pain of right knee   . Coronary artery disease   . Hypertension   . Phimosis     Past Surgical History: Past Surgical History:  Procedure Laterality Date  . CIRCUMCISION N/A 10/12/2018   Procedure: CIRCUMCISION ADULT;  Surgeon: Franchot Gallo, MD;  Location: [facility];  Service: Urology;  Laterality: N/A;  45 mins  . CO2 LASER APPLICATION N/A 07/28/1698   Procedure: CO2 LASER APPLICATION;  Surgeon: Franchot Gallo, MD;  Location: [facility];  Service: Urology;  Laterality: N/A;  30 MINS  . COLONOSCOPY    . CORONARY ARTERY BYPASS GRAFT N/A 12/12/2018   Procedure: CORONARY ARTERY BYPASS GRAFTING (CABG) x four, using bilateral internal mammary arteries and right leg greater saphenous vein harvested endoscopically;  Surgeon: [name], MD;  Location: [place];  Service: Open Heart Surgery;  Laterality: N/A;  . LEFT HEART CATH AND CORONARY ANGIOGRAPHY N/A 11/28/2018   Procedure: LEFT HEART CATH AND  CORONARY ANGIOGRAPHY;  Surgeon: Burnell Blanks, MD;  Location: [place] [facility];  Service: Cardiovascular;  Laterality: N/A;  . MEDIASTINAL EXPLORATION N/A 12/12/2018   Procedure: Mediastinal Re-Exploration after bypass graft;  Surgeon: [name], MD;  Location: [place];  Service: Open Heart Surgery;  Laterality: N/A;  . TEE WITHOUT CARDIOVERSION N/A 12/12/2018   Procedure: TRANSESOPHAGEAL ECHOCARDIOGRAM (TEE);  Surgeon: [name], MD;  Location: [place];  Service: Open Heart Surgery;  Laterality: N/A;  . TOOTH EXTRACTION    . TOTAL HIP ARTHROPLASTY Right 03/08/2019   Procedure: RIGHT TOTAL HIP ARTHROPLASTY ANTERIOR APPROACH;  Surgeon: Mcarthur Rossetti, MD;  Location: [facility];  Service: Orthopedics;  Laterality: Right;    Family History: Family History  Problem Relation Age of Onset  . Diabetes Mother   . Heart disease Mother     Social History: Social History   Socioeconomic History  . Marital status: Married    Spouse name: Not on file  . Number of children: Not on file  . Years of education: Not on file  . Highest education level: Not on file  Occupational History  . Not on file  Tobacco Use  . Smoking status: Never Smoker  . Smokeless tobacco: Never Used  Vaping Use  . Vaping Use: Never used  Substance and Sexual Activity  . Alcohol use: No    Alcohol/week: 0.0 standard drinks  . Drug use: No  . Sexual activity: Yes  Other Topics Concern  . Not on file  Social History Narrative   Lives with wife.     Social Determinants of Health   Financial Resource Strain:   . Difficulty of Paying Living Expenses: Not on file  Food Insecurity:   . Worried About Charity fundraiser in the Last Year: Not on file  . Ran Out of Food in the Last Year: Not on file  Transportation Needs:   . Lack of Transportation (Medical): Not on file  . Lack of Transportation (Non-Medical): Not on file  Physical Activity:   . Days of Exercise per Week: Not on file   . Minutes of Exercise per Session: Not on  file  Stress:   . Feeling of Stress : Not on file  Social Connections:   . Frequency of Communication with Friends and Family: Not on file  . Frequency of Social Gatherings with Friends and Family: Not on file  . Attends Religious Services: Not on file  . Active Member of Clubs or Organizations: Not on file  . Attends Archivist Meetings: Not on file  . Marital Status: Not on file    Allergies:  No Known Allergies  Objective:    Vital Signs:   Temp:  [97.4 F (36.3 C)-98.1 F (36.7 C)] 97.4 F (36.3 C) (11/29 0742) Pulse Rate:  [48-65] 48 (11/29 0742) Resp:  [14-17] 15 (11/29 0742) BP: (96-125)/(50-70) 101/66 (11/29 0742) SpO2:  [91 %-97 %] 95 % (11/29 0742) Weight:  [527 kg] 113 kg (11/29 0500) Last BM Date: 12/22/19  Weight change: Filed Weights   12/21/19 0400 12/22/19 0335 12/23/19 0500  Weight: 114 kg 113.1 kg 113 kg    Intake/Output:   Intake/Output Summary (Last 24 hours) at 12/23/2019 1055 Last data filed at 12/23/2019 0855 Gross per 24 hour  Intake 300 ml  Output 350 ml  Net -50 ml      Physical Exam    General:  Fatigue and pale. No resp difficulty on n/c.  HEENT: normal Neck: supple. no JVD. Carotids 2+ bilat; no bruits. No lymphadenopathy or thryomegaly appreciated. Cor: PMI nondisplaced. Regular rhythm. No rubs, gallops or murmurs. Lungs: clear upper bilateral lobes.  Abdomen: soft, nontender, nondistended. No hepatosplenomegaly. No bruits or masses. Good bowel sounds. Extremities: no cyanosis, clubbing, or rash. Swelling noted to L foot  Neuro: alert & oriented x 3, moves all 4 extremities w/o difficulty. Affect pleasant  Telemetry   Sinus bradycardia, rates 40-50s.  Personally reviewed.   EKG    SR w/ TWI septal, anterior and lateral leads with QTc 489.    Labs   Basic Metabolic Panel: Recent Labs  Lab 12/17/19 0145 12/17/19 0145 12/18/19 0040 12/21/19 1406 12/22/19 0235  12/23/19 0309  NA 140  --  138 137 139  --   K 4.5  --  4.7 5.3* 5.9* 4.7  CL 112*  --  109 109 109  --   CO2 18*  --  18* 18* 22  --   GLUCOSE 205*  --  224* 225* 170*  --   BUN 63*  --  65* 60* 56*  --   CREATININE 1.45*  --  1.26* 1.23 1.19  --   CALCIUM 7.7*   < > 7.7* 8.0* 8.0*  --   MG 3.4*  --  3.6*  --   --   --   PHOS 4.5  --  3.8  --   --   --    < > = values in this interval not displayed.    Liver Function Tests: Recent Labs  Lab 12/17/19 0145 12/18/19 0040 12/21/19 1406 12/22/19 0235  AST 38 37 30 25  ALT 59* 55* 60* 55*  ALKPHOS 55 58 45 39  BILITOT 1.0 0.7 1.0 1.1  PROT 6.2* 5.7* 5.6* 5.3*  ALBUMIN 2.5* 2.4* 2.5* 2.4*   No results for input(s): LIPASE, AMYLASE in the last 168 hours. No results for input(s): AMMONIA in the last 168 hours.  CBC: Recent Labs  Lab 12/17/19 0145 12/18/19 0040 12/21/19 1406 12/22/19 0235  WBC 9.5 8.3 7.3 6.6  NEUTROABS 8.6* 7.4  --   --   HGB 11.5*  11.2* 11.6* 11.0*  HCT 36.0* 35.3* 36.7* 33.8*  MCV 87.6 87.2 88.2 86.2  PLT 314 283 190 174    Cardiac Enzymes: No results for input(s): CKTOTAL, CKMB, CKMBINDEX, TROPONINI in the last 168 hours.  BNP: BNP (last 3 results) Recent Labs    12/18/19 0040 12/19/19 0023 12/21/19 1406  BNP 347.8* 365.7* 662.9*    ProBNP (last 3 results) No results for input(s): PROBNP in the last 8760 hours.   CBG: Recent Labs  Lab 12/22/19 0759 12/22/19 1201 12/22/19 1710 12/22/19 2010 12/23/19 0726  GLUCAP 144* 171* 138* 135* 87    Coagulation Studies: No results for input(s): LABPROT, INR in the last 72 hours.   Imaging    No results found.   Medications:     Current Medications: . vitamin C  500 mg Oral Daily  . aspirin EC  81 mg Oral QHS  . atorvastatin  40 mg Oral q1800  . bisacodyl  10 mg Oral Daily  . carvedilol  3.125 mg Oral BID WC  . cholecalciferol  1,000 Units Oral Daily  . enoxaparin (LOVENOX) injection  55 mg Subcutaneous Q24H  . feeding  supplement (GLUCERNA SHAKE)  237 mL Oral TID BM  . folic acid  1 mg Oral Daily  . insulin aspart  0-9 Units Subcutaneous TID WC  . insulin glargine  6 Units Subcutaneous Daily  . Ipratropium-Albuterol  1 puff Inhalation BID  . magnesium hydroxide  30 mL Oral BID  . methylPREDNISolone (SOLU-MEDROL) injection  40 mg Intravenous Daily  . multivitamin with minerals  1 tablet Oral Daily  . pantoprazole  40 mg Oral Daily  . polyethylene glycol  17 g Oral Daily  . thiamine  100 mg Oral Daily  . zinc sulfate  220 mg Oral Daily     Infusions: . lactated ringers         Patient Profile   64 y.o.malewith medical history significant forchronic systolic CHF, ischemic cardiomyopathy with LVEF 35 to 40%, coronary artery disease status post CABG, penile cancer, carotid artery stenosis, who presented to [facility] via EMS after being found severely hypoxicathome.Unfortunately patient is unvaccinated against Covid, his work-up was significant for COVID-19 of pneumonia and severe hypoxia for which he is admitted by [facility].  Assessment/Plan   1.  Acute respiratory failure 2/2 COVID-19 Viral PNA -Per primary team -Received remdesivir x 5 days, IV steroids (tapering down: initially on 125 TID now on 40 mg daily), tocilizumab/baricitinib.  -Improving only needing 1L at rest and 1-2 L with activity.   2. Hypotension (hx of hypertension) -Trend Bps, lowest reading 90/50 (MAP 62) on 12/16/2019 with highest BP 126/77 (MAP 92) on 11/26.   -Holding home farxiga, dig, lasix, aldactone, entresto; home coreg reduced 3.125 BID -Had similar episode post-CABG 11/2018 required NE and dopamine then transitioned to midodrine 5 mg TID.  -Started LR 100 mL/h x 10 hours today by primary team -Check orthostatic VS -Hold tussionex, coreg -May need to add midodrine to keep MAPs  3. Bradycardia -Hx of bradycardia, coming off bypass was sinus bradycardic - Lowest HR 48 -Home coreg dose reduced from 6.25 BID to 3.125  BID -Hold tussionex, coreg  4. CAD: S/p CABG in 11/20. He graduated cardiac rehab 6/21  - Lipid panel good 7/21 - Continue ASA 81 daily.   5. Chronic systolic CHF 2/2 Ischemic cardiomyopathy.   - Pre-CABG echo with EF 30-35%, mild RV dysfunction.  Post-CABG echo with EF 30-35%, severe RV systolic dysfunction.  V/Q  scan done, no evidence for PE. Hypotensive post-CABG requiring pressors and then midodrine, but improved over time.  -Echo repeated 3/21, EF 35-40% with moderate RV dysfunction, GIIDD. Cardiac MRI was recommended to quantify EF more exactly for purposes of ICD determination, however pt is adamant that he does not want an ICD and is declined cMRI.  - BNP on admission 442, repeat BNP 11/27 663.  Overall, weight is down 11lbs since admission and near baseline weight (5/21: wt 112.4 kg).  - NYHA Class II - Home Coreg 6.25 mg bid dose reduced to 3.125 mg BID.   - Holding home Entresto to 97-103 bid d/t BPs - Holding home spironolactone 25 mg daily d/t BPs  - Holding home digoxin 0.125 d/t Bps and bradycardia.   - Holding home Farxiga 5 mg daily d/t Bps   - Once stable, will need to resume GMDT and follow up outpatient.   6. Carotid stenosis:  - dopplers 11/20 showed 40-59% Rt and 1-39% Lt ICA stenosis   - Repeat carotid dopplers study scheduled 01/14/2020.  - continue ASA and statin.   7. Acute on CKD -admission Cr 1.32, peak 1.51 on 11/22 - Baseline Cr around 1 -Today Cr 1.19 -Continue to monitor BMP  8. Hyperkalemia -Yesterday: K+ 5.9, given kayexalate and lokelma now K+ 4.7 -Continue to monitor  Length of Stay: [place], NP  12/23/2019, 10:55 AM  Advanced Heart Failure Team Pager [phone] (M-F; [place])  Please contact [facility] for night-coverage after hours (4p -7a ) and weekends on [email or web address]  Patient seen with NP, agree with the above note.   Events as described above.  Patient was admitted with COVID-19 PNA, now recovering.  His cardiac meds  have been held because of soft BP (I see SBP 90s-100s) and HR in 50s-60s (currently 60s).    He is comfortable at rest, breathing has improved.  Was a little lightheaded with standing today.  He has been started on LR 100 cc/hr x 10 hrs.    General: NAD Neck: No JVD, no thyromegaly or thyroid nodule.  Lungs: Dependent crackles.  CV: Nondisplaced PMI.  Heart regular S1/S2, no S3/S4, no murmur.  1+ left foot edema.  No carotid bruit.  Normal pedal pulses.  Abdomen: Soft, nontender, no hepatosplenomegaly, no distention.  Skin: Intact without lesions or rashes.  Neurologic: Alert and oriented x 3.  Psych: Normal affect. Extremities: No clubbing or cyanosis.  HEENT: Normal.   Would check orthostatics today.  His BP/HR are reasonable currently, can see orthostatic hypotension post-COVID.   - If not orthostatic, would stop IV fluid after 500 cc (couple more hours).  - Will obtain echocardiogram to make sure LV function is stable (was 35-40%).  - Continue Coreg 3.125 mg bid for now.  - Hold off on digoxin for now, if HR remains stable can restart.  - Hold off on spironolactone for now, if not orthostatic can likely restart.  - Hold off on Entresto for now, may need to try to restart as outpatient.  - Likely restart dapagliflozin tomorrow if stable.   Low TSH, will check free T3 and T4.  [name] 12/23/2019 2:00 PM

## 2019-12-23 NOTE — Discharge Summary (Signed)
Frank Love Parish NOB:[id number] DOB: 1955-07-13 DOA: 12/13/2019  PCP: [name], NP  Admit date: 12/13/2019  Discharge date: 12/23/2019  Admitted From: Home   Disposition:  Home   Recommendations for Outpatient Follow-up:   Follow up with PCP in 1-2 weeks  PCP Please obtain BMP/CBC, 2 view CXR in 1week,  (see Discharge instructions)   PCP Please follow up on the following pending results: Monitor BP, Hrate closely   Home Health: None   Equipment/Devices: 2lit o2, 3in1, Rolling walker  Consultations: None  Discharge Condition: Stable    CODE STATUS: Full    Diet Recommendation: Heart Healthy Low Carb, 1.5 lit fluid restriction.    Chief Complaint  Patient presents with  . Covid Exposure  . Shortness of Breath  . generalized weakness     Brief history of present illness from the day of admission and additional interim summary    64 y.o.malewith medical history significant forchronic systolic CHF, ischemic cardiomyopathy with LVEF 35 to 40%, coronary artery disease status post CABG, penile cancer, carotid artery stenosis, who presented to [facility] via EMS after being found severely hypoxicathome.Unfortunately patient is unvaccinated against Covid, his work-up was significant for COVID-19 of pneumonia and severe hypoxia for which he is admitted by [facility].                                                                 Hospital Course     1.  Acute hypoxic respiratory failure due to COVID-19 pneumonia.  He is unvaccinated and incurred severe parenchymal lung injury, he was initially requiring between 20 to 25 L of oxygen.  He was treated with remdesivir, IV steroids and Actemra.  He has shown good clinical improvement.    Is now symptom-free on 1 L nasal cannula oxygen at rest, upon ambulation requiring  1 to 2 L as well.  Will be discharged home on rescue inhaler along with 2 L nasal cannula oxygen as needed, has finished his treatment now.  Follow with PCP in a week.    Recent Labs  Lab 12/17/19 0145 12/18/19 0040 12/19/19 0023 12/21/19 1406 12/22/19 0235  WBC 9.5 8.3  --  7.3 6.6  CRP 1.4* 1.0*  --   --   --   DDIMER 1.78* 1.51*  --   --   --   BNP 491.8* 347.8* 365.7* 662.9*  --   AST 38 37  --  30 25  ALT 59* 55*  --  60* 55*  ALKPHOS 55 58  --  45 39  BILITOT 1.0 0.7  --  1.0 1.1  ALBUMIN 2.5* 2.4*  --  2.5* 2.4*      2.  Chronic combined systolic and diastolic heart failure recent echocardiogram shows EF of 35%.  He is currently compensated.  His blood pressure at times was falling in mid 40s along with soft blood pressure, digoxin discontinued, Coreg dose cut in half, Entresto prescription till tomorrow.  Will follow with PCP and primary cardiology within a week.  3.  Dyslipidemia.  On statin.  4.  CAD s/p CABG.  On aspirin and statin combination, low-dose beta-blocker if tolerated by blood pressure and heart rate.  5.  Hyperkalemia.  Treated and now stable.    Discharge diagnosis     Active Problems:   Ischemic cardiomyopathy   S/P CABG x 4   Coronary artery disease   COVID-19    Discharge instructions    Discharge Instructions    Discharge instructions   Complete by: As directed    Follow with Primary MD [name], NP and your cardiologist in 7 days   Get CBC, CMP, 2 view Chest X ray -  checked next visit within 1 week by Primary MD   Activity: As tolerated with Full fall precautions use walker/cane & assistance as needed  Disposition Home  Diet: Heart Healthy Low Carb, 1.5 L/day total fluid restriction.   Check your Weight same time everyday, if you gain over 2 pounds, or you develop in leg swelling, experience more shortness of breath or chest pain, call your Primary MD immediately. Follow Cardiac Low Salt Diet and 1.5 lit/day fluid  restriction.  Special Instructions: If you have smoked or chewed Tobacco  in the last 2 yrs please stop smoking, stop any regular Alcohol  and or any Recreational drug use.  On your next visit with your primary care physician please Get Medicines reviewed and adjusted.  Please request your Prim.MD to go over all Hospital Tests and Procedure/Radiological results at the follow up, please get all Hospital records sent to your Prim MD by signing hospital release before you go home.  If you experience worsening of your admission symptoms, develop shortness of breath, life threatening emergency, suicidal or homicidal thoughts you must seek medical attention immediately by calling 911 or calling your MD immediately  if symptoms less severe.  You Must read complete instructions/literature along with all the possible adverse reactions/side effects for all the Medicines you take and that have been prescribed to you. Take any new Medicines after you have completely understood and accpet all the possible adverse reactions/side effects.   Increase activity slowly   Complete by: As directed       Discharge Medications   Allergies as of 12/23/2019   No Known Allergies     Medication List    STOP taking these medications   Digitek 0.125 MG tablet Generic drug: digoxin     TAKE these medications   albuterol 108 (90 Base) MCG/ACT inhaler Commonly known as: VENTOLIN HFA Inhale 2 puffs into the lungs every 6 (six) hours as needed for wheezing or shortness of breath.   aspirin EC 81 MG tablet Take 81 mg by mouth at bedtime.   atorvastatin 40 MG tablet Commonly known as: LIPITOR TAKE 1 TABLET (40 MG TOTAL) BY MOUTH DAILY AT 6 PM. What changed: when to take this   carvedilol 3.125 MG tablet Commonly known as: COREG Take 1 tablet (3.125 mg total) by mouth 2 (two) times daily with a meal. What changed:   medication strength  how much to take   Entresto 97-103 MG Generic drug:  sacubitril-valsartan Take 1 tablet by mouth 2 (two) times daily. Start taking on: December 24, 2019   Farxiga 5 MG Tabs tablet  Generic drug: dapagliflozin propanediol Take 5 mg by mouth every evening.   furosemide 20 MG tablet Commonly known as: LASIX Take 1 tablet (20 mg total) by mouth daily.   nitroGLYCERIN 0.4 MG SL tablet Commonly known as: NITROSTAT Place 1 tablet (0.4 mg total) under the tongue every 5 (five) minutes as needed for chest pain. NO more than 3 pills--if you have the need to take 2 pills or more call MD   potassium chloride SA 20 MEQ tablet Commonly known as: KLOR-CON Take 1 tablet (20 mEq total) by mouth daily. Takes while taking furosemide   spironolactone 25 MG tablet Commonly known as: ALDACTONE Take 1 tablet (25 mg total) by mouth daily.            Durable Medical Equipment  (From admission, onward)         Start     Ordered   12/23/19 0945  For home use only DME oxygen  Once       Question Answer Comment  Length of Need 6 Months   Mode or (Route) Nasal cannula   Liters per Minute 2   Frequency Continuous (stationary and portable oxygen unit needed)   Oxygen conserving device Yes   Oxygen delivery system Gas      12/23/19 0944   12/22/19 0917  For home use only DME 3 n 1  Once        12/22/19 0917   12/22/19 0917  For home use only DME Walker rolling  Once       Comments: 5 wheel  Question Answer Comment  Walker: With [place] Wheels   Patient needs a walker to treat with the following condition Weakness      12/22/19 0917           Follow-up Information    [name], NP. Schedule an appointment as soon as possible for a visit in 1 week(s).   Specialty: Nurse Practitioner Why: Follow with your cardiologist within a week. Contact information: [place] [address] [phone]        [name], MD .   Specialty: Cardiology Contact information: [address] [place]  [address] [phone]        Larey Dresser, MD. Schedule an appointment as soon as possible for a visit in 1 week(s).   Specialty: Cardiology Contact information: [place] [address] [phone]               Major procedures and Radiology Reports - PLEASE review detailed and final reports thoroughly  -       DG Chest Portable 1 View  Result Date: 12/13/2019 CLINICAL DATA:  Shortness of breath, suspicion of COVID EXAM: PORTABLE CHEST 1 VIEW COMPARISON:  Radiograph 01/07/2019, CT 11/20/2018 FINDINGS: Heterogeneous consolidative and hazy opacity is present in a lower lung and peripheral predominance most coalescent in the left lung periphery. Some more bandlike opacities, possibly subsegmental atelectatic changes or scarring. No pneumothorax. No visible effusion though the costophrenic sulci are partially collimated. Postsurgical changes related to prior CABG including intact and aligned sternotomy wires and multiple surgical clips projecting over the mediastinum. Telemetry leads overlie the chest. The aorta is calcified. The remaining cardiomediastinal contours are unremarkable. Degenerative changes are present in the imaged spine and shoulders. IMPRESSION: 1. Heterogeneous consolidative and hazy opacity in a lower lung and peripheral predominance most coalescent in the left lung periphery, compatible with a multifocal pneumonia including atypical  viral etiology in the appropriate clinical setting. 2. More bandlike opacities likely reflect subsegmental atelectatic changes or scarring. Electronically Signed   By: Lovena Le M.D.   On: 12/13/2019 22:28   VAS [place] LOWER EXTREMITY VENOUS (DVT)  Result Date: 12/15/2019  Lower Venous DVT Study Indications: Elevated ddimer.  Comparison Study: 07/09/18 previous Performing Technologist: Abram Sander RVS  Examination Guidelines: A complete evaluation includes B-mode imaging, spectral Doppler, color Doppler, and power Doppler as  needed of all accessible portions of each vessel. Bilateral testing is considered an integral part of a complete examination. Limited examinations for reoccurring indications may be performed as noted. The reflux portion of the exam is performed with the patient in reverse Trendelenburg.  +---------+---------------+---------+-----------+----------+--------------+ RIGHT    CompressibilityPhasicitySpontaneityPropertiesThrombus Aging +---------+---------------+---------+-----------+----------+--------------+ CFV      Full           Yes      Yes                                 +---------+---------------+---------+-----------+----------+--------------+ SFJ      Full                                                        +---------+---------------+---------+-----------+----------+--------------+ FV Prox  Full                                                        +---------+---------------+---------+-----------+----------+--------------+ FV Mid   Full                                                        +---------+---------------+---------+-----------+----------+--------------+ FV DistalFull                                                        +---------+---------------+---------+-----------+----------+--------------+ PFV      Full                                                        +---------+---------------+---------+-----------+----------+--------------+ POP      Full           Yes      Yes                                 +---------+---------------+---------+-----------+----------+--------------+ PTV      Full                                                        +---------+---------------+---------+-----------+----------+--------------+  PERO     Full                                                        +---------+---------------+---------+-----------+----------+--------------+    +---------+---------------+---------+-----------+----------+--------------+ LEFT     CompressibilityPhasicitySpontaneityPropertiesThrombus Aging +---------+---------------+---------+-----------+----------+--------------+ CFV      Full           Yes      Yes                                 +---------+---------------+---------+-----------+----------+--------------+ SFJ      Full                                                        +---------+---------------+---------+-----------+----------+--------------+ FV Prox  Full                                                        +---------+---------------+---------+-----------+----------+--------------+ FV Mid   Full                                                        +---------+---------------+---------+-----------+----------+--------------+ FV DistalFull                                                        +---------+---------------+---------+-----------+----------+--------------+ PFV      Full                                                        +---------+---------------+---------+-----------+----------+--------------+ POP      Full           Yes      Yes                                 +---------+---------------+---------+-----------+----------+--------------+ PTV      Full                                                        +---------+---------------+---------+-----------+----------+--------------+ PERO     Full                                                        +---------+---------------+---------+-----------+----------+--------------+  Summary: BILATERAL: - No evidence of deep vein thrombosis seen in the lower extremities, bilaterally. - No evidence of superficial venous thrombosis in the lower extremities, bilaterally. -No evidence of popliteal cyst, bilaterally. RIGHT: - Findings appear essentially unchanged compared to previous examination.   *See table(s) above for  measurements and observations. Electronically signed by Deitra Mayo MD on 12/15/2019 at 6:22:41 PM.    Final     Micro Results     Recent Results (from the past 240 hour(s))  Respiratory Panel by RT PCR (Flu A&B, Covid) - Nasopharyngeal Swab     Status: Abnormal   Collection Time: 12/13/19  9:47 PM   Specimen: Nasopharyngeal Swab; Nasopharyngeal(NP) swabs in vial transport medium  Result Value Ref Range Status   SARS Coronavirus 2 by RT PCR POSITIVE (A) NEGATIVE Final    Comment: RESULT CALLED TO, READ BACK BY AND VERIFIED WITH: E SULLIVAN RN 12/13/19 2330 JDW (NOTE) SARS-CoV-2 target nucleic acids are DETECTED.  SARS-CoV-2 RNA is generally detectable in upper respiratory specimens  during the acute phase of infection. Positive results are indicative of the presence of the identified virus, but do not rule out bacterial infection or co-infection with other pathogens not detected by the test. Clinical correlation with patient history and other diagnostic information is necessary to determine patient infection status. The expected result is Negative.  Fact Sheet for Patients:  PinkCheek.be  Fact Sheet for Healthcare Providers: GravelBags.it  This test is not yet approved or cleared by the [place] FDA and  has been authorized for detection and/or diagnosis of SARS-CoV-2 by FDA under an Emergency Use Authorization (EUA).  This EUA will remain in effect (meaning this test can be use d) for the duration of  the COVID-19 declaration under Section 564(b)(1) of the Act, 21 U.S.C. section 360bbb-3(b)(1), unless the authorization is terminated or revoked sooner.      Influenza A by PCR NEGATIVE NEGATIVE Final   Influenza B by PCR NEGATIVE NEGATIVE Final    Comment: (NOTE) The Xpert Xpress SARS-CoV-2/FLU/RSV assay is intended as an aid in  the diagnosis of influenza from Nasopharyngeal swab specimens and  should  not be used as a sole basis for treatment. Nasal washings and  aspirates are unacceptable for Xpert Xpress SARS-CoV-2/FLU/RSV  testing.  Fact Sheet for Patients: PinkCheek.be  Fact Sheet for Healthcare Providers: GravelBags.it  This test is not yet approved or cleared by the [place] FDA and  has been authorized for detection and/or diagnosis of SARS-CoV-2 by  FDA under an Emergency Use Authorization (EUA). This EUA will remain  in effect (meaning this test can be used) for the duration of the  Covid-19 declaration under Section 564(b)(1) of the Act, 21  U.S.C. section 360bbb-3(b)(1), unless the authorization is  terminated or revoked. Performed at [facility], [place] [address], [place], [place] [address]   Blood culture (routine x 2)     Status: None   Collection Time: 12/13/19 10:45 PM   Specimen: BLOOD  Result Value Ref Range Status   Specimen Description BLOOD RIGHT ANTECUBITAL  Final   Special Requests   Final    BOTTLES DRAWN AEROBIC AND ANAEROBIC Blood Culture adequate volume   Culture   Final    NO GROWTH 5 DAYS Performed at [facility], [place] [address], [place], [place] [address]    Report Status 12/18/2019 FINAL  Final  Blood culture (routine x 2)     Status: None   Collection Time:  12/13/19 11:27 PM   Specimen: BLOOD RIGHT HAND  Result Value Ref Range Status   Specimen Description BLOOD RIGHT HAND  Final   Special Requests   Final    BOTTLES DRAWN AEROBIC AND ANAEROBIC Blood Culture adequate volume   Culture   Final    NO GROWTH 5 DAYS Performed at [facility], [address] [place], [place] [address]    Report Status 12/19/2019 FINAL  Final    Today   Subjective    Baylon Santelli today has no headache,no chest abdominal pain,no new weakness tingling or numbness, feels much better wants to go home today.     Objective   Blood pressure 101/66, pulse 52, temperature (!)  97.4 F (36.3 C), temperature source Oral, resp. rate 15, height 6\' 2"  (1.88 m), weight 113 kg, SpO2 95 %.   Intake/Output Summary (Last 24 hours) at 12/23/2019 0949 Last data filed at 12/23/2019 0855 Gross per 24 hour  Intake 300 ml  Output 350 ml  Net -50 ml    Exam  Awake Alert, No new F.N deficits, Normal affect [place].AT,PERRAL Supple Neck,No JVD, No cervical lymphadenopathy appriciated.  Symmetrical Chest wall movement, Good air movement bilaterally, CTAB RRR,No Gallops,Rubs or new Murmurs, No Parasternal Heave +ve B.Sounds, Abd Soft, Non tender, No organomegaly appriciated, No rebound -guarding or rigidity. No Cyanosis, Clubbing or edema, No new Rash or bruise   Data Review   CBC w Diff:  Lab Results  Component Value Date   WBC 6.6 12/22/2019   HGB 11.0 (L) 12/22/2019   HGB 11.4 (L) 01/03/2019   HCT 33.8 (L) 12/22/2019   HCT 34.1 (L) 01/03/2019   PLT 174 12/22/2019   PLT 287 01/03/2019   LYMPHOPCT 4 12/18/2019   MONOPCT 4 12/18/2019   EOSPCT 0 12/18/2019   BASOPCT 0 12/18/2019    CMP:  Lab Results  Component Value Date   NA 139 12/22/2019   NA 145 (H) 05/02/2019   K 4.7 12/23/2019   CL 109 12/22/2019   CO2 22 12/22/2019   BUN 56 (H) 12/22/2019   BUN 27 05/02/2019   CREATININE 1.19 12/22/2019   PROT 5.3 (L) 12/22/2019   PROT 7.2 08/03/2018   ALBUMIN 2.4 (L) 12/22/2019   ALBUMIN 4.2 08/03/2018   BILITOT 1.1 12/22/2019   BILITOT 1.3 (H) 08/03/2018   ALKPHOS 39 12/22/2019   AST 25 12/22/2019   ALT 55 (H) 12/22/2019  .   Total Time in preparing paper work, data evaluation and todays exam - 84 minutes  Lala Lund M.D on 12/23/2019 at 9:49 AM  [facility]   Office  [phone]

## 2019-12-23 NOTE — Progress Notes (Signed)
SATURATION QUALIFICATIONS: (This note is used to comply with regulatory documentation for home oxygen)  Patient Saturations on Room Air at Rest = 84%  Patient Saturations on Room Air while Ambulating = 74%  Patient Saturations on 3Liters of oxygen while Ambulating = 91%  Please briefly explain why patient needs home oxygen:

## 2019-12-23 NOTE — Progress Notes (Signed)
Physical Therapy Treatment Patient Details Name: Frank Love MRN: [id number] DOB: [birth year] Today's Date: 12/23/2019    History of Present Illness 64 y.o. male with medical history significant for chronic systolic CHF, ischemic cardiomyopathy with LVEF 35 to 40%, coronary artery disease status post CABG, penile cancer, carotid artery stenosis, who presented to [facility] via EMS after being found severely hypoxic at home. In the ED, he required nonrebreather and high flow nasal cannula to maintain O2 saturation greater than 92%.  Work-up in the ED revealed severe acute hypoxic respiratory failure secondary to COVID-19 viral pneumonia. Unvaccinated. Admitted 12/13/19    PT Comments    Patient limited this date by orthostasis with upright activity. Patient s/p fluid bolus per RN and OK to work with pt. Patient needing to use BSC on arrival. Slowly moved to sitting EOB with MAP (84). After seated on BSC MAP (62) with +dizziness. Returned to bed/supine with MAP (82). RN made aware. Patient with poor pleth for sats and unable to get an accurate reading throughout session.     Follow Up Recommendations  Home health PT;Supervision/Assistance - 24 hour;Supervision - Intermittent     Equipment Recommendations  Rolling walker with 5" wheels;3in1 (PT)    Recommendations for Other Services       Precautions / Restrictions Precautions Precautions: Other (comment) Precaution Comments: 2L     Mobility  Bed Mobility Overal bed mobility: Needs Assistance Bed Mobility: Supine to Sit     Supine to sit: HOB elevated;Supervision     General bed mobility comments: for safety and line mgmt  Transfers Overall transfer level: Needs assistance Equipment used: None Transfers: Sit to/from [facility] Sit to Stand: Min guard Stand pivot transfers: Min guard       General transfer comment: close guarding due to recent hypotension and dizziness  Ambulation/Gait              General Gait Details: unable due to hypotension;dizziness   Stairs             Wheelchair Mobility    Modified Rankin (Stroke Patients Only)       Balance Overall balance assessment: Needs assistance   Sitting balance-Leahy Scale: Good     Standing balance support: Bilateral upper extremity supported Standing balance-Leahy Scale: Poor Standing balance comment: reliant on BUE support                            Cognition Arousal/Alertness: Awake/alert Behavior During Therapy: WFL for tasks assessed/performed Overall Cognitive Status: Within Functional Limits for tasks assessed                                        Exercises      General Comments        Pertinent Vitals/Pain Pain Assessment: No/denies pain    Home Living                      Prior Function            PT Goals (current goals can now be found in the care plan section) Acute Rehab PT Goals Patient Stated Goal: to go home Time For Goal Achievement: 12/29/19 Potential to Achieve Goals: Fair Progress towards PT goals: Not progressing toward goals - comment (hypotensive/dizziness)    Frequency    Min 3X/week  PT Plan Current plan remains appropriate    Co-evaluation              AM-PAC PT "6 Clicks" Mobility   Outcome Measure  Help needed turning from your back to your side while in a flat bed without using bedrails?: A Little Help needed moving from lying on your back to sitting on the side of a flat bed without using bedrails?: A Little Help needed moving to and from a bed to a chair (including a wheelchair)?: A Little Help needed standing up from a chair using your arms (e.g., wheelchair or bedside chair)?: A Little Help needed to walk in hospital room?: Total Help needed climbing 3-5 steps with a railing? : Total 6 Click Score: 14    End of Session Equipment Utilized During Treatment: Oxygen Activity Tolerance: Treatment  limited secondary to medical complications (Comment) (hypotension) Patient left: with call bell/phone within reach;in bed;with nursing/sitter in room Nurse Communication: Mobility status;Other (comment) (hypotension) PT Visit Diagnosis: Other abnormalities of gait and mobility (R26.89);Muscle weakness (generalized) (M62.81);Difficulty in walking, not elsewhere classified (R26.2)     Time: 6004-5997 PT Time Calculation (min) (ACUTE ONLY): 19 min  Charges:  $Therapeutic Activity: 8-22 mins                      Frank Love, PT Pager [phone]    Frank Love 12/23/2019, 5:08 PM

## 2019-12-23 NOTE — Progress Notes (Addendum)
Hold Patient post discharge more bradycardic and at times hypotensive.  Discharge, IV fluids, digoxin already discontinued, Entresto already discontinued, Coreg cut in half, will be held.  Cardiology will be consulted as he follows with CHF team.  TSH ordered.

## 2019-12-23 NOTE — Plan of Care (Signed)
Patient is currently resting in bed. VSS. Was on room air while sitting in chair. Once in bed and sleeping patient oxygen at 88%, patient placed on [facility]. Can wean off to room air once patient is awake for the day. OOB to [facility]. Voiding. Call bell within reach. No complaints overnight.   Problem: Education: Goal: Knowledge of risk factors and measures for prevention of condition will improve Outcome: Progressing   Problem: Respiratory: Goal: Will maintain a patent airway Outcome: Progressing Goal: Complications related to the disease process, condition or treatment will be avoided or minimized Outcome: Progressing   Problem: Education: Goal: Knowledge of General Education information will improve Description: Including pain rating scale, medication(s)/side effects and non-pharmacologic comfort measures Outcome: Progressing   Problem: Health Behavior/Discharge Planning: Goal: Ability to manage health-related needs will improve Outcome: Progressing   Problem: Clinical Measurements: Goal: Ability to maintain clinical measurements within normal limits will improve Outcome: Progressing Goal: Will remain free from infection Outcome: Progressing Goal: Diagnostic test results will improve Outcome: Progressing Goal: Respiratory complications will improve Outcome: Progressing Goal: Cardiovascular complication will be avoided Outcome: Progressing   Problem: Activity: Goal: Risk for activity intolerance will decrease Outcome: Progressing   Problem: Nutrition: Goal: Adequate nutrition will be maintained Outcome: Progressing   Problem: Coping: Goal: Level of anxiety will decrease Outcome: Progressing   Problem: Elimination: Goal: Will not experience complications related to bowel motility Outcome: Progressing Goal: Will not experience complications related to urinary retention Outcome: Progressing   Problem: Pain Managment: Goal: General experience of comfort will improve Outcome:  Progressing   Problem: Safety: Goal: Ability to remain free from injury will improve Outcome: Progressing   Problem: Skin Integrity: Goal: Risk for impaired skin integrity will decrease Outcome: Progressing

## 2019-12-24 ENCOUNTER — Ambulatory Visit: Payer: Self-pay | Admitting: Urology

## 2019-12-24 ENCOUNTER — Ambulatory Visit (HOSPITAL_COMMUNITY)
Admission: RE | Admit: 2019-12-24 | Payer: MEDICAID | Source: Ambulatory Visit | Attending: Cardiology | Admitting: Cardiology

## 2019-12-24 ENCOUNTER — Inpatient Hospital Stay (HOSPITAL_COMMUNITY): Payer: Self-pay

## 2019-12-24 ENCOUNTER — Encounter (HOSPITAL_COMMUNITY): Payer: Self-pay | Admitting: Internal Medicine

## 2019-12-24 DIAGNOSIS — I5022 Chronic systolic (congestive) heart failure: Secondary | ICD-10-CM

## 2019-12-24 DIAGNOSIS — U071 COVID-19: Secondary | ICD-10-CM

## 2019-12-24 LAB — CBC WITH DIFFERENTIAL/PLATELET
Abs Immature Granulocytes: 0.16 10*3/uL — ABNORMAL HIGH (ref 0.00–0.07)
Basophils Absolute: 0 10*3/uL (ref 0.0–0.1)
Basophils Relative: 0 %
Eosinophils Absolute: 0 10*3/uL (ref 0.0–0.5)
Eosinophils Relative: 1 %
HCT: 32.1 % — ABNORMAL LOW (ref 39.0–52.0)
Hemoglobin: 10.4 g/dL — ABNORMAL LOW (ref 13.0–17.0)
Immature Granulocytes: 4 %
Lymphocytes Relative: 11 %
Lymphs Abs: 0.5 10*3/uL — ABNORMAL LOW (ref 0.7–4.0)
MCH: 28.2 pg (ref 26.0–34.0)
MCHC: 32.4 g/dL (ref 30.0–36.0)
MCV: 87 fL (ref 80.0–100.0)
Monocytes Absolute: 0.3 10*3/uL (ref 0.1–1.0)
Monocytes Relative: 7 %
Neutro Abs: 3.6 10*3/uL (ref 1.7–7.7)
Neutrophils Relative %: 77 %
Platelets: 124 10*3/uL — ABNORMAL LOW (ref 150–400)
RBC: 3.69 MIL/uL — ABNORMAL LOW (ref 4.22–5.81)
RDW: 14.8 % (ref 11.5–15.5)
WBC: 4.6 10*3/uL (ref 4.0–10.5)
nRBC: 0 % (ref 0.0–0.2)

## 2019-12-24 LAB — MAGNESIUM: Magnesium: 2.6 mg/dL — ABNORMAL HIGH (ref 1.7–2.4)

## 2019-12-24 LAB — BASIC METABOLIC PANEL
Anion gap: 10 (ref 5–15)
BUN: 50 mg/dL — ABNORMAL HIGH (ref 8–23)
CO2: 27 mmol/L (ref 22–32)
Calcium: 7.6 mg/dL — ABNORMAL LOW (ref 8.9–10.3)
Chloride: 104 mmol/L (ref 98–111)
Creatinine, Ser: 1.11 mg/dL (ref 0.61–1.24)
GFR, Estimated: >60 mL/min (ref >60)
Glucose, Bld: 123 mg/dL — ABNORMAL HIGH (ref 70–99)
Potassium: 4.3 mmol/L (ref 3.5–5.1)
Sodium: 141 mmol/L (ref 135–145)

## 2019-12-24 LAB — T3, FREE: T3, Free: 1.3 pg/mL — ABNORMAL LOW (ref 2.0–4.4)

## 2019-12-24 LAB — ECHOCARDIOGRAM COMPLETE
Area-P 1/2: 2.54 cm2
Height: 74 in
P 1/2 time: 521 msec
S' Lateral: 4.13 cm
Weight: 3961.23 oz

## 2019-12-24 LAB — BRAIN NATRIURETIC PEPTIDE: B Natriuretic Peptide: 283.5 pg/mL — ABNORMAL HIGH (ref 0.0–100.0)

## 2019-12-24 LAB — GLUCOSE, CAPILLARY
Glucose-Capillary: 86 mg/dL (ref 70–99)
Glucose-Capillary: 140 mg/dL — ABNORMAL HIGH (ref 70–99)
Glucose-Capillary: 152 mg/dL — ABNORMAL HIGH (ref 70–99)
Glucose-Capillary: 151 mg/dL — ABNORMAL HIGH (ref 70–99)

## 2019-12-24 MED ORDER — DAPAGLIFLOZIN PROPANEDIOL 5 MG PO TABS
5.0000 mg | ORAL_TABLET | Freq: Every evening | ORAL | Status: DC
  Administered 2019-12-24: 5 mg via ORAL
  Filled 2019-12-24 (×2): qty 1

## 2019-12-24 MED ORDER — METHYLPREDNISOLONE SODIUM SUCC 40 MG IJ SOLR
20.0000 mg | Freq: Every day | INTRAMUSCULAR | Status: DC
  Administered 2019-12-25: 20 mg via INTRAVENOUS
  Filled 2019-12-24: qty 1

## 2019-12-24 MED ORDER — NITROGLYCERIN 0.4 MG SL SUBL: 0.4000 mg | SUBLINGUAL_TABLET | SUBLINGUAL | Status: DC | PRN

## 2019-12-24 NOTE — Discharge Instructions (Signed)
Follow with Primary MD [name], NP and your cardiologist in 7 days   Get CBC, CMP, 2 view Chest X ray -  checked next visit within 1 week by Primary MD   Repeat TSH, T3 and free T4 in 4 weeks by PCP.  Activity: As tolerated with Full fall precautions use walker/cane & assistance as needed  Disposition Home  Diet: Heart Healthy Low Carb, 1.5 L/day total fluid restriction.   Check your Weight same time everyday, if you gain over 2 pounds, or you develop in leg swelling, experience more shortness of breath or chest pain, call your Primary MD immediately. Follow Cardiac Low Salt Diet and 1.5 lit/day fluid restriction.  Special Instructions: If you have smoked or chewed Tobacco  in the last 2 yrs please stop smoking, stop any regular Alcohol  and or any Recreational drug use.  On your next visit with your primary care physician please Get Medicines reviewed and adjusted.  Please request your Prim.MD to go over all Hospital Tests and Procedure/Radiological results at the follow up, please get all Hospital records sent to your Prim MD by signing hospital release before you go home.  If you experience worsening of your admission symptoms, develop shortness of breath, life threatening emergency, suicidal or homicidal thoughts you must seek medical attention immediately by calling 911 or calling your MD immediately  if symptoms less severe.  You Must read complete instructions/literature along with all the possible adverse reactions/side effects for all the Medicines you take and that have been prescribed to you. Take any new Medicines after you have completely understood and accpet all the possible adverse reactions/side effects.          Person Under Monitoring Name: Frank Love  Location: [place] [address]   Infection Prevention Recommendations for Individuals Confirmed to have, or Being Evaluated for, 2019 Novel Coronavirus (COVID-19)  Infection Who Receive Care at Home  Individuals who are confirmed to have, or are being evaluated for, COVID-19 should follow the prevention steps below until a healthcare provider or local or state health department says they can return to normal activities.  Stay home except to get medical care You should restrict activities outside your home, except for getting medical care. Do not go to work, school, or public areas, and do not use public transportation or taxis.  Call ahead before visiting your doctor Before your medical appointment, call the healthcare provider and tell them that you have, or are being evaluated for, COVID-19 infection. This will help the healthcare providers office take steps to keep other people from getting infected. Ask your healthcare provider to call the local or state health department.  Monitor your symptoms Seek prompt medical attention if your illness is worsening (e.g., difficulty breathing). Before going to your medical appointment, call the healthcare provider and tell them that you have, or are being evaluated for, COVID-19 infection. Ask your healthcare provider to call the local or state health department.  Wear a facemask You should wear a facemask that covers your nose and mouth when you are in the same room with other people and when you visit a healthcare provider. People who live with or visit you should also wear a facemask while they are in the same room with you.  Separate yourself from other people in your home As much as possible, you should stay in a different room from other people in your home. Also, you should use a separate  bathroom, if available.  Avoid sharing household items You should not share dishes, drinking glasses, cups, eating utensils, towels, bedding, or other items with other people in your home. After using these items, you should wash them thoroughly with soap and water.  Cover your coughs and sneezes Cover your  mouth and nose with a tissue when you cough or sneeze, or you can cough or sneeze into your sleeve. Throw used tissues in a lined trash can, and immediately wash your hands with soap and water for at least 20 seconds or use an alcohol-based hand rub.  Wash your [facility] your hands often and thoroughly with soap and water for at least 20 seconds. You can use an alcohol-based hand sanitizer if soap and water are not available and if your hands are not visibly dirty. Avoid touching your eyes, nose, and mouth with unwashed hands.   Prevention Steps for Caregivers and Household Members of Individuals Confirmed to have, or Being Evaluated for, COVID-19 Infection Being Cared for in the Home  If you live with, or provide care at home for, a person confirmed to have, or being evaluated for, COVID-19 infection please follow these guidelines to prevent infection:  Follow healthcare providers instructions Make sure that you understand and can help the patient follow any healthcare provider instructions for all care.  Provide for the patients basic needs You should help the patient with basic needs in the home and provide support for getting groceries, prescriptions, and other personal needs.  Monitor the patients symptoms If they are getting sicker, call his or her medical provider and tell them that the patient has, or is being evaluated for, COVID-19 infection. This will help the healthcare providers office take steps to keep other people from getting infected. Ask the healthcare provider to call the local or state health department.  Limit the number of people who have contact with the patient  If possible, have only one caregiver for the patient.  Other household members should stay in another home or place of residence. If this is not possible, they should stay  in another room, or be separated from the patient as much as possible. Use a separate bathroom, if available.  Restrict  visitors who do not have an essential need to be in the home.  Keep older adults, very young children, and other sick people away from the patient Keep older adults, very young children, and those who have compromised immune systems or chronic health conditions away from the patient. This includes people with chronic heart, lung, or kidney conditions, diabetes, and cancer.  Ensure good ventilation Make sure that shared spaces in the home have good air flow, such as from an air conditioner or an opened window, weather permitting.  Wash your hands often  Wash your hands often and thoroughly with soap and water for at least 20 seconds. You can use an alcohol based hand sanitizer if soap and water are not available and if your hands are not visibly dirty.  Avoid touching your eyes, nose, and mouth with unwashed hands.  Use disposable paper towels to dry your hands. If not available, use dedicated cloth towels and replace them when they become wet.  Wear a facemask and gloves  Wear a disposable facemask at all times in the room and gloves when you touch or have contact with the patients blood, body fluids, and/or secretions or excretions, such as sweat, saliva, sputum, nasal mucus, vomit, urine, or feces.  Ensure the mask fits  over your nose and mouth tightly, and do not touch it during use.  Throw out disposable facemasks and gloves after using them. Do not reuse.  Wash your hands immediately after removing your facemask and gloves.  If your personal clothing becomes contaminated, carefully remove clothing and launder. Wash your hands after handling contaminated clothing.  Place all used disposable facemasks, gloves, and other waste in a lined container before disposing them with other household waste.  Remove gloves and wash your hands immediately after handling these items.  Do not share dishes, glasses, or other household items with the patient  Avoid sharing household items. You  should not share dishes, drinking glasses, cups, eating utensils, towels, bedding, or other items with a patient who is confirmed to have, or being evaluated for, COVID-19 infection.  After the person uses these items, you should wash them thoroughly with soap and water.  Wash laundry thoroughly  Immediately remove and wash clothes or bedding that have blood, body fluids, and/or secretions or excretions, such as sweat, saliva, sputum, nasal mucus, vomit, urine, or feces, on them.  Wear gloves when handling laundry from the patient.  Read and follow directions on labels of laundry or clothing items and detergent. In general, wash and dry with the warmest temperatures recommended on the label.  Clean all areas the individual has used often  Clean all touchable surfaces, such as counters, tabletops, doorknobs, bathroom fixtures, toilets, phones, keyboards, tablets, and bedside tables, every day. Also, clean any surfaces that may have blood, body fluids, and/or secretions or excretions on them.  Wear gloves when cleaning surfaces the patient has come in contact with.  Use a diluted bleach solution (e.g., dilute bleach with 1 part bleach and 10 parts water) or a household disinfectant with a label that says EPA-registered for coronaviruses. To make a bleach solution at home, add 1 tablespoon of bleach to 1 quart (4 cups) of water. For a larger supply, add  cup of bleach to 1 gallon (16 cups) of water.  Read labels of cleaning products and follow recommendations provided on product labels. Labels contain instructions for safe and effective use of the cleaning product including precautions you should take when applying the product, such as wearing gloves or eye protection and making sure you have good ventilation during use of the product.  Remove gloves and wash hands immediately after cleaning.  Monitor yourself for signs and symptoms of illness Caregivers and household members are considered  close contacts, should monitor their health, and will be asked to limit movement outside of the home to the extent possible. Follow the monitoring steps for close contacts listed on the symptom monitoring form.   ? If you have additional questions, contact your local health department or call the epidemiologist on call at [phone] (available 24/7). ? This guidance is subject to change. For the most up-to-date guidance from [facility], please refer to their website: [email or web address]

## 2019-12-24 NOTE — Progress Notes (Addendum)
Advanced Heart Failure Rounding Note  PCP-Cardiologist: [name], MD   Subjective:    Orthostatic yesterday, BP sitting 107/61, Lying 108/58, Standing 89/55. Received IVF bolus.   Orthostatic VS not yet repeated today. Still feels a bit dizzy w/ standing but better today.    Continues w/ transient bradycardia, lowest HR 49 bpm, sinus. HR currently in mid 49s.    Wt stable off diuretics.  Labs ok. Hgb 10.4, SCr 1.1. K 4.3   Currently off [place]. Breathing ok. Complains of mild nausea   Echo completed today. LVEF 45-50% (improved from prior study, previously 35-40%). RV mildly reduced. No significant valvular disease. No pericardial effusion.     Objective:   Weight Range: 112.3 kg Body mass index is 31.79 kg/m.   Vital Signs:   Temp:  [97.6 F (36.4 C)-98.2 F (36.8 C)] 97.9 F (36.6 C) (11/30 1125) Pulse Rate:  [47-107] 53 (11/30 1125) Resp:  [11-18] 11 (11/30 1125) BP: (102-137)/(53-74) 137/74 (11/30 1125) SpO2:  [85 %-100 %] 98 % (11/30 1125) Weight:  [112.3 kg] 112.3 kg (11/30 0423) Last BM Date: 12/22/19  Weight change: Filed Weights   12/22/19 0335 12/23/19 0500 12/24/19 0423  Weight: 113.1 kg 113 kg 112.3 kg    Intake/Output:   Intake/Output Summary (Last 24 hours) at 12/24/2019 1252 Last data filed at 12/24/2019 0850 Gross per 24 hour  Intake 593.02 ml  Output --  Net 593.02 ml      Physical Exam    PHYSICAL EXAM: General:  Well appearing. No respiratory difficulty HEENT: normal Neck: supple. no JVD. Carotids 2+ bilat; no bruits. No lymphadenopathy or thyromegaly appreciated. Cor: PMI nondisplaced. Regular rate & rhythm. No rubs, gallops or murmurs. Lungs: clear Abdomen: soft, nontender, nondistended. No hepatosplenomegaly. No bruits or masses. Good bowel sounds. Extremities: no cyanosis, clubbing, rash, edema Neuro: alert & oriented x 3, cranial nerves grossly intact. moves all 4 extremities w/o difficulty. Affect  pleasant.    Telemetry   NSR 65 bpm, some sinus brady, down to 49 bpm   EKG    No new EKG to review   Labs    CBC Recent Labs    12/22/19 0235 12/24/19 0130  WBC 6.6 4.6  NEUTROABS  --  3.6  HGB 11.0* 10.4*  HCT 33.8* 32.1*  MCV 86.2 87.0  PLT 174 697*   Basic Metabolic Panel Recent Labs    12/23/19 1127 12/24/19 0130  NA 139 141  K 4.4 4.3  CL 108 104  CO2 24 27  GLUCOSE 100* 123*  BUN 53* 50*  CREATININE 1.27* 1.11  CALCIUM 7.7* 7.6*  MG 2.5* 2.6*   Liver Function Tests Recent Labs    12/21/19 1406 12/22/19 0235  AST 30 25  ALT 60* 55*  ALKPHOS 45 39  BILITOT 1.0 1.1  PROT 5.6* 5.3*  ALBUMIN 2.5* 2.4*   No results for input(s): LIPASE, AMYLASE in the last 72 hours. Cardiac Enzymes No results for input(s): CKTOTAL, CKMB, CKMBINDEX, TROPONINI in the last 72 hours.  BNP: BNP (last 3 results) Recent Labs    12/19/19 0023 12/21/19 1406 12/24/19 0130  BNP 365.7* 662.9* 283.5*    ProBNP (last 3 results) No results for input(s): PROBNP in the last 8760 hours.   D-Dimer No results for input(s): DDIMER in the last 72 hours. Hemoglobin A1C No results for input(s): HGBA1C in the last 72 hours. Fasting Lipid Panel No results for input(s): CHOL, HDL, LDLCALC, TRIG, CHOLHDL, LDLDIRECT in the last  72 hours. Thyroid Function Tests Recent Labs    12/23/19 1127 12/23/19 1539  TSH 0.059*  --   T3FREE  --  1.3*    Other results:   Imaging    ECHOCARDIOGRAM COMPLETE  Result Date: 12/24/2019    ECHOCARDIOGRAM REPORT   Patient Name:   Frank Love Date of Exam: 12/24/2019 Medical Rec #:  [id number]          Height:       74.0 in Accession #:    [id number]         Weight:       247.6 lb Date of Birth:  Oct 30, 1955          BSA:          2.380 m Patient Age:    30 years           BP:           102/54 mmHg Patient Gender: M                  HR:           61 bpm. Exam Location:  Inpatient Procedure: 2D Echo, Cardiac Doppler and Color Doppler  Indications:    D14.97 Chronic systolic (congestive) heart failure  History:        Patient has prior history of Echocardiogram examinations, most                 recent 04/18/2019. COVID-19 Positive.  Sonographer:    Jonelle Sidle Dance Referring Phys: [id number] Carlene Coria  Sonographer Comments: No subcostal window. IMPRESSIONS  1. Left ventricular ejection fraction, by estimation, is 45 to 50%. The left ventricle has mildly decreased function. The left ventricle demonstrates global hypokinesis. The left ventricular internal cavity size was mildly dilated. There is mild left ventricular hypertrophy. Left ventricular diastolic parameters are indeterminate.  2. Right ventricular systolic function is mildly reduced. The right ventricular size is normal.  3. Left atrial size was moderately dilated.  4. The mitral valve is grossly normal. Trivial mitral valve regurgitation. No evidence of mitral stenosis.  5. The aortic valve is grossly normal. Aortic valve regurgitation is trivial. No aortic stenosis is present. Comparison(s): Prior images reviewed side by side. Changes from prior study are noted. Conclusion(s)/Recommendation(s): No hemodynamically significant valvular heart disease. LV function appears improved when directly compared to prior. FINDINGS  Left Ventricle: Left ventricular ejection fraction, by estimation, is 45 to 50%. The left ventricle has mildly decreased function. The left ventricle demonstrates global hypokinesis. The left ventricular internal cavity size was mildly dilated. There is  mild left ventricular hypertrophy. Left ventricular diastolic parameters are indeterminate. Right Ventricle: The right ventricular size is normal. Right vetricular wall thickness was not well visualized. Right ventricular systolic function is mildly reduced. Left Atrium: Left atrial size was moderately dilated. Right Atrium: Right atrial size was normal in size. Pericardium: There is no evidence of pericardial effusion.  Mitral Valve: The mitral valve is grossly normal. Trivial mitral valve regurgitation. No evidence of mitral valve stenosis. Tricuspid Valve: The tricuspid valve is normal in structure. Tricuspid valve regurgitation is trivial. Aortic Valve: The aortic valve is grossly normal. Aortic valve regurgitation is trivial. Aortic regurgitation PHT measures 521 msec. No aortic stenosis is present. Pulmonic Valve: The pulmonic valve was not well visualized. Pulmonic valve regurgitation is mild. No evidence of pulmonic stenosis. Aorta: The aortic root and ascending aorta are structurally normal, with no evidence of dilitation and the aortic  arch was not well visualized. Venous: The inferior vena cava was not well visualized. IAS/Shunts: The atrial septum is grossly normal.  LEFT VENTRICLE PLAX 2D LVIDd:         5.65 cm  Diastology LVIDs:         4.13 cm  LV e' medial:    6.09 cm/s LV PW:         1.40 cm  LV E/e' medial:  11.8 LV IVS:        1.12 cm  LV e' lateral:   7.51 cm/s LVOT diam:     2.10 cm  LV E/e' lateral: 9.6 LV SV:         74 LV SV Index:   31 LVOT Area:     3.46 cm  RIGHT VENTRICLE RV Basal diam:  2.96 cm RV S prime:     7.40 cm/s TAPSE (M-mode): 1.4 cm LEFT ATRIUM              Index       RIGHT ATRIUM           Index LA diam:        5.50 cm  2.31 cm/m  RA Area:     13.80 cm LA Vol (A2C):   114.0 ml 47.90 ml/m RA Volume:   28.40 ml  11.93 ml/m LA Vol (A4C):   87.6 ml  36.81 ml/m LA Biplane Vol: 101.0 ml 42.44 ml/m  AORTIC VALVE LVOT Vmax:   85.00 cm/s LVOT Vmean:  60.800 cm/s LVOT VTI:    0.213 m AI PHT:      521 msec  AORTA Ao Root diam: 3.50 cm Ao Asc diam:  3.70 cm MITRAL VALVE MV Area (PHT): 2.54 cm    SHUNTS MV Decel Time: 299 msec    Systemic VTI:  0.21 m MV E velocity: 71.80 cm/s  Systemic Diam: 2.10 cm MV A velocity: 81.50 cm/s MV E/A ratio:  0.88 Buford Dresser MD Electronically signed by Buford Dresser MD Signature Date/Time: 12/24/2019/12:01:13 PM    Final       Medications:      Scheduled Medications: . vitamin C  500 mg Oral Daily  . aspirin EC  81 mg Oral QHS  . atorvastatin  40 mg Oral q1800  . bisacodyl  10 mg Oral Daily  . carvedilol  3.125 mg Oral BID WC  . cholecalciferol  1,000 Units Oral Daily  . dapagliflozin propanediol  5 mg Oral QPM  . enoxaparin (LOVENOX) injection  55 mg Subcutaneous Q24H  . feeding supplement (GLUCERNA SHAKE)  237 mL Oral TID BM  . folic acid  1 mg Oral Daily  . insulin aspart  0-9 Units Subcutaneous TID WC  . insulin glargine  6 Units Subcutaneous Daily  . Ipratropium-Albuterol  1 puff Inhalation BID  . melatonin  3 mg Oral QHS  . [START ON 12/25/2019] methylPREDNISolone (SOLU-MEDROL) injection  20 mg Intravenous Daily  . multivitamin with minerals  1 tablet Oral Daily  . pantoprazole  40 mg Oral Daily  . polyethylene glycol  17 g Oral Daily  . thiamine  100 mg Oral Daily  . zinc sulfate  220 mg Oral Daily     Infusions:   PRN Medications:  acetaminophen, nitroGLYCERIN, ondansetron (ZOFRAN) IV, sodium chloride    Assessment/Plan   1.  Acute respiratory failure 2/2 COVID-19 Viral PNA -Per primary team -Received remdesivir x 5 days, IV steroids (tapering down: initially on 125 TID now on 40  mg daily), tocilizumab/baricitinib.  - improving. Off continuous [place]   2. Hypotension (hx of hypertension) Orthostatic yesterday, BP sitting 107/61, Lying 108/58, Standing 89/55. Received IVF bolus.  Still feels a bit dizzy w/ standing but better today. Will ask RN to repeat and document orthostatic VS -Continue to hold home farxiga, dig, lasix, aldactone, entresto; home coreg reduced to 3.125 BID -Had similar episode post-CABG 11/2018 required NE and dopamine then transitioned to midodrine 5 mg TID.  - add TED hoses - may need to restart midodrine if he remains orthostatic    3. Bradycardia -Hx of bradycardia, coming off bypass was sinus bradycardic - Lowest HR 48 -Home coreg dose reduced from 6.25 BID to 3.125 BID  this admit - Resting HR low 60s currently, transient bradycardia lowest HR 49   4. CAD: S/p CABG in 11/20.He graduated cardiac rehab 6/21 - Lipid panel good 7/21 - Continue ASA 81 daily.   5. Chronic systolic CHF 2/2 Ischemic cardiomyopathy.  - Pre-CABG echo with EF 30-35%, mild RV dysfunction. Post-CABG echo with EF 30-35%, severe RV systolic dysfunction. V/Q scan done, no evidence for PE. Hypotensive post-CABG requiring pressors and then midodrine, but improved over time.  -Echo repeated 3/21, EF 35-40% with moderate RV dysfunction, GIIDD.Cardiac MRIwas recommendedto quantify EF more exactly for purposes of ICD determination, however pt was adamant that he did not want an ICD and declined cMRI.  - Echo repeated this admit. LVEF improved to 45-50%. RV mildly reduced  - BNP on admission 442, repeat BNP 11/27 663.  Overall, weight is down 13 bs since admission and near baseline weight (5/21: wt 112.4 kg).  - NYHA Class II - Home Coreg 6.25 mg bid dose reduced to 3.125 mg BID due to bradycardia -Holding homeEntresto to 97-103 bid d/t BPs - Holding home spironolactone 25 mg daily d/t BPs  - Holding home digoxin0.125 d/t Bps and bradycardia.   - Holding home Farxiga 5 mg daily d/t Bps   - Once stable, will need to resume GMDT and follow up outpatient.   6. Carotid stenosis: - dopplers 11/20 showed 40-59% Rt and 1-39% Lt ICA stenosis  -Repeat carotid dopplers study scheduled 01/14/2020.  - continue ASA and statin.   7. Acute on CKD -admission Cr 1.32, peak 1.51 on 11/22 - Baseline Cr around 1 -Today Cr 1.11 -Continue to monitor BMP  8. Hyperkalemia - resolved, 4.3 today    Length of Stay: [address] PA-C  12/24/2019, 12:52 PM  Advanced Heart Failure Team Pager [phone] (M-F; 7a - 4p)  Please contact [facility] for night-coverage after hours (4p -7a ) and weekends on [email or web address]  Echo reviewed, EF improved to 45-50%.  SBP generally 90s-100s.   Volume status ok.  Add ted hose.  Continue to hold meds as in note above.  Repeat orthostatics.    [name] 12/24/2019

## 2019-12-24 NOTE — Progress Notes (Addendum)
PROGRESS NOTE                                                                                                                                                                                                             Patient Demographics:    Frank Love, is a 64 y.o. male, DOB - 04/17/1955, EXH:[id number]  Admit date - 12/13/2019   Admitting Physician Kayleen Memos, DO  Outpatient Primary MD for the patient is [name], NP  LOS - 11  Chief Complaint  Patient presents with  . Covid Exposure  . Shortness of Breath  . generalized weakness       Brief Narrative (HPI from H&P) - 64 y.o.malewith medical history significant forchronic systolic CHF, ischemic cardiomyopathy with LVEF 35 to 40%, coronary artery disease status post CABG, penile cancer, carotid artery stenosis, who presented to [facility] via EMS after being found severely hypoxicathome.Unfortunately patient is unvaccinated against Covid, his work-up was significant for COVID-19 of pneumonia and severe hypoxia for which he is admitted by [facility].   Subjective:   Patient in chair this is chest pain or shortness of breath, no focal weakness.   Assessment  & Plan :     1.  Acute hypoxic respiratory failure due to COVID-19 pneumonia.  He is unvaccinated and incurred severe parenchymal lung injury, he was initially requiring between 20 to 25 L of oxygen.  He was treated with remdesivir, IV steroids and Actemra.  He has shown good clinical improvement.  Now down to 2lit, stable for DC - wants to stay another day as he has no help at home, he was actually discharged on 12/23/2019 but then kept back as he was not feeling well.    Recent Labs  Lab 12/18/19 0040 12/19/19 0023 12/21/19 1406 12/22/19 0235 12/24/19 0130  WBC 8.3  --  7.3 6.6 4.6  HGB 11.2*  --  11.6* 11.0* 10.4*  HCT 35.3*  --  36.7* 33.8* 32.1*  PLT 283  --  190 174 124*  CRP 1.0*  --   --   --   --   BNP 347.8* 365.7* 662.9*  --  283.5*   DDIMER 1.51*  --   --   --   --   AST 37  --  30 25  --   ALT 55*  --  60* 55*  --   ALKPHOS 58  --  45 39  --   BILITOT 0.7  --  1.0 1.1  --  ALBUMIN 2.4*  --  2.5* 2.4*  --      2.  Chronic combined systolic and diastolic heart failure recent echocardiogram shows EF of 35%.  He is currently compensated.  Low-dose Coreg + farsiga, stopped Entresto and digoxin.  Overall heart rate and blood pressure still soft, seen by cardiology here, repeat echo pending from 12/24/2019, if stable discharge on 12/25/2019, he refused to go home today.    Kindly review cardiac medications closely prior to discharge, for now Coreg dose has been reduced, Entresto and digoxin have been stopped.  Wilder Glade has been resumed at her dose, diuretics currently on hold.  3.  Dyslipidemia.  On statin.  4.  CAD s/p CABG.  On aspirin and statin combination, low-dose beta-blocker if tolerated by blood pressure and heart rate.  5.  Hyperkalemia.  Treated will monitor.  6.  Mildly suppressed TSH.  Free T4 slightly high.  Since he is bradycardic and hypotensive I do not think he is a exhibiting any signs of hyperthyroidism, will request to repeat TSH, T3 and free T4 in 4 weeks by PCP.     Condition - Extremely Guarded  Family Communication  :  Wife in the hospital  Code Status :  Full  Consults  :  Cards  Procedures  :    TTE  Leg [place] - No DVT  PUD Prophylaxis : PPI  Disposition Plan  :    Status is: Inpatient  Remains inpatient appropriate because:IV treatments appropriate due to intensity of illness or inability to take PO   Dispo:  Patient From: Home  Planned Disposition: Home  Expected discharge date: 12/24/19  Medically stable for discharge: No   DVT Prophylaxis  :  Lovenox   Lab Results  Component Value Date   PLT 124 (L) 12/24/2019    Diet :  Diet Order            Diet heart healthy/carb modified Room service appropriate? Yes; Fluid consistency: Thin  Diet effective now                   Inpatient Medications Scheduled Meds: . vitamin C  500 mg Oral Daily  . aspirin EC  81 mg Oral QHS  . atorvastatin  40 mg Oral q1800  . bisacodyl  10 mg Oral Daily  . carvedilol  3.125 mg Oral BID WC  . cholecalciferol  1,000 Units Oral Daily  . dapagliflozin propanediol  5 mg Oral QPM  . enoxaparin (LOVENOX) injection  55 mg Subcutaneous Q24H  . feeding supplement (GLUCERNA SHAKE)  237 mL Oral TID BM  . folic acid  1 mg Oral Daily  . insulin aspart  0-9 Units Subcutaneous TID WC  . insulin glargine  6 Units Subcutaneous Daily  . Ipratropium-Albuterol  1 puff Inhalation BID  . melatonin  3 mg Oral QHS  . [START ON 12/25/2019] methylPREDNISolone (SOLU-MEDROL) injection  20 mg Intravenous Daily  . multivitamin with minerals  1 tablet Oral Daily  . pantoprazole  40 mg Oral Daily  . polyethylene glycol  17 g Oral Daily  . thiamine  100 mg Oral Daily  . zinc sulfate  220 mg Oral Daily   Continuous Infusions: PRN Meds:.acetaminophen, nitroGLYCERIN, ondansetron (ZOFRAN) IV, sodium chloride  Antibiotics  :   Anti-infectives (From admission, onward)   Start     Dose/Rate Route Frequency Ordered Stop   12/15/19 1000  remdesivir 100 mg in sodium chloride 0.9 % 100 mL IVPB       "  Followed by" Linked Group Details   100 mg 200 mL/hr over 30 Minutes Intravenous Daily 12/14/19 0002 12/18/19 0849   12/14/19 1000  remdesivir 100 mg in sodium chloride 0.9 % 100 mL IVPB  Status:  Discontinued       "Followed by" Linked Group Details   100 mg 200 mL/hr over 30 Minutes Intravenous Daily 12/13/19 2358 12/14/19 0001   12/14/19 0030  remdesivir 100 mg in sodium chloride 0.9 % 100 mL IVPB       "Followed by" Linked Group Details   100 mg 200 mL/hr over 30 Minutes Intravenous Every 30 min 12/14/19 0002 12/14/19 0210   12/14/19 0000  remdesivir 200 mg in sodium chloride 0.9% 250 mL IVPB  Status:  Discontinued       "Followed by" Linked Group Details   200 mg 580 mL/hr over 30 Minutes  Intravenous Once 12/13/19 2358 12/14/19 0001   12/13/19 2245  vancomycin (VANCOREADY) IVPB 2000 mg/400 mL        2,000 mg 200 mL/hr over 120 Minutes Intravenous  Once 12/13/19 2241 12/14/19 0210   12/13/19 2245  ceFEPIme (MAXIPIME) 2 g in sodium chloride 0.9 % 100 mL IVPB        2 g 200 mL/hr over 30 Minutes Intravenous  Once 12/13/19 2241 12/14/19 0002          Objective:   Vitals:   12/24/19 0400 12/24/19 0423 12/24/19 0731 12/24/19 1125  BP: (!) 115/57  (!) 102/54 137/74  Pulse: (!) 47  (!) 49 (!) 53  Resp: 15  16 11   Temp: 97.6 F (36.4 C)  97.6 F (36.4 C) 97.9 F (36.6 C)  TempSrc: Oral  Oral Oral  SpO2: 100%  99% 98%  Weight:  112.3 kg    Height:        SpO2: 98 % O2 Flow Rate (L/min): 1 L/min FiO2 (%): 36 %  Wt Readings from Last 3 Encounters:  12/24/19 112.3 kg  09/10/19 114.8 kg  08/13/19 114.8 kg     Intake/Output Summary (Last 24 hours) at 12/24/2019 1130 Last data filed at 12/24/2019 0850 Gross per 24 hour  Intake 593.02 ml  Output --  Net 593.02 ml     Physical Exam  Awake Alert, No new F.N deficits, Normal affect [place].AT,PERRAL Supple Neck,No JVD, No cervical lymphadenopathy appriciated.  Symmetrical Chest wall movement, Good air movement bilaterally, CTAB RRR,No Gallops, Rubs or new Murmurs, No Parasternal Heave +ve B.Sounds, Abd Soft, No tenderness, No organomegaly appriciated, No rebound - guarding or rigidity. No Cyanosis, Clubbing or edema, No new Rash or bruise    Data Review:   Recent Labs  Lab 12/18/19 0040 12/21/19 1406 12/22/19 0235 12/24/19 0130  WBC 8.3 7.3 6.6 4.6  HGB 11.2* 11.6* 11.0* 10.4*  HCT 35.3* 36.7* 33.8* 32.1*  PLT 283 190 174 124*  MCV 87.2 88.2 86.2 87.0  MCH 27.7 27.9 28.1 28.2  MCHC 31.7 31.6 32.5 32.4  RDW 14.8 14.7 14.7 14.8  LYMPHSABS 0.4*  --   --  0.5*  MONOABS 0.3  --   --  0.3  EOSABS 0.0  --   --  0.0  BASOSABS 0.0  --   --  0.0    Recent Labs  Lab 12/18/19 0040 12/18/19 0040  12/19/19 0023 12/21/19 1406 12/22/19 0235 12/23/19 0309 12/23/19 1127 12/24/19 0130  NA 138  --   --  137 139  --  139 141  K 4.7   < >  --  5.3* 5.9* 4.7 4.4 4.3  CL 109  --   --  109 109  --  108 104  CO2 18*  --   --  18* 22  --  24 27  GLUCOSE 224*  --   --  225* 170*  --  100* 123*  BUN 65*  --   --  60* 56*  --  53* 50*  CREATININE 1.26*  --   --  1.23 1.19  --  1.27* 1.11  CALCIUM 7.7*  --   --  8.0* 8.0*  --  7.7* 7.6*  AST 37  --   --  30 25  --   --   --   ALT 55*  --   --  60* 55*  --   --   --   ALKPHOS 58  --   --  45 39  --   --   --   BILITOT 0.7  --   --  1.0 1.1  --   --   --   ALBUMIN 2.4*  --   --  2.5* 2.4*  --   --   --   MG 3.6*  --   --   --   --   --  2.5* 2.6*  CRP 1.0*  --   --   --   --   --   --   --   DDIMER 1.51*  --   --   --   --   --   --   --   TSH  --   --   --   --   --   --  0.059*  --   BNP 347.8*  --  365.7* 662.9*  --   --   --  283.5*   < > = values in this interval not displayed.    Recent Labs  Lab 12/18/19 0040 12/19/19 0023 12/21/19 1406 12/24/19 0130  CRP 1.0*  --   --   --   DDIMER 1.51*  --   --   --   BNP 347.8* 365.7* 662.9* 283.5*    ------------------------------------------------------------------------------------------------------------------ No results for input(s): CHOL, HDL, LDLCALC, TRIG, CHOLHDL, LDLDIRECT in the last 72 hours.  Lab Results  Component Value Date   HGBA1C 6.1 (H) 12/14/2019   ------------------------------------------------------------------------------------------------------------------ Recent Labs    12/23/19 1127 12/23/19 1539  TSH 0.059*  --   T3FREE  --  1.3*   ------------------------------------------------------------------------------------------------------------------ No results for input(s): VITAMINB12, FOLATE, FERRITIN, TIBC, IRON, RETICCTPCT in the last 72 hours.  Coagulation profile No results for input(s): INR, PROTIME in the last 168 hours.  No results for input(s):  DDIMER in the last 72 hours.  Cardiac Enzymes No results for input(s): CKMB, TROPONINI, MYOGLOBIN in the last 168 hours.  Invalid input(s): CK ------------------------------------------------------------------------------------------------------------------    Component Value Date/Time   BNP 283.5 (H) 12/24/2019 0130    Micro Results No results found for this or any previous visit (from the past 240 hour(s)).  Radiology Reports DG Chest Portable 1 View  Result Date: 12/13/2019 CLINICAL DATA:  Shortness of breath, suspicion of COVID EXAM: PORTABLE CHEST 1 VIEW COMPARISON:  Radiograph 01/07/2019, CT 11/20/2018 FINDINGS: Heterogeneous consolidative and hazy opacity is present in a lower lung and peripheral predominance most coalescent in the left lung periphery. Some more bandlike opacities, possibly subsegmental atelectatic changes or scarring. No pneumothorax. No visible effusion though the costophrenic sulci are partially collimated. Postsurgical changes related to prior  CABG including intact and aligned sternotomy wires and multiple surgical clips projecting over the mediastinum. Telemetry leads overlie the chest. The aorta is calcified. The remaining cardiomediastinal contours are unremarkable. Degenerative changes are present in the imaged spine and shoulders. IMPRESSION: 1. Heterogeneous consolidative and hazy opacity in a lower lung and peripheral predominance most coalescent in the left lung periphery, compatible with a multifocal pneumonia including atypical viral etiology in the appropriate clinical setting. 2. More bandlike opacities likely reflect subsegmental atelectatic changes or scarring. Electronically Signed   By: Lovena Le M.D.   On: 12/13/2019 22:28   VAS [place] LOWER EXTREMITY VENOUS (DVT)  Result Date: 12/15/2019  Lower Venous DVT Study Indications: Elevated ddimer.  Comparison Study: 07/09/18 previous Performing Technologist: Abram Sander RVS  Examination Guidelines: A  complete evaluation includes B-mode imaging, spectral Doppler, color Doppler, and power Doppler as needed of all accessible portions of each vessel. Bilateral testing is considered an integral part of a complete examination. Limited examinations for reoccurring indications may be performed as noted. The reflux portion of the exam is performed with the patient in reverse Trendelenburg.  +---------+---------------+---------+-----------+----------+--------------+ RIGHT    CompressibilityPhasicitySpontaneityPropertiesThrombus Aging +---------+---------------+---------+-----------+----------+--------------+ CFV      Full           Yes      Yes                                 +---------+---------------+---------+-----------+----------+--------------+ SFJ      Full                                                        +---------+---------------+---------+-----------+----------+--------------+ FV Prox  Full                                                        +---------+---------------+---------+-----------+----------+--------------+ FV Mid   Full                                                        +---------+---------------+---------+-----------+----------+--------------+ FV DistalFull                                                        +---------+---------------+---------+-----------+----------+--------------+ PFV      Full                                                        +---------+---------------+---------+-----------+----------+--------------+ POP      Full           Yes      Yes                                 +---------+---------------+---------+-----------+----------+--------------+  PTV      Full                                                        +---------+---------------+---------+-----------+----------+--------------+ PERO     Full                                                         +---------+---------------+---------+-----------+----------+--------------+   +---------+---------------+---------+-----------+----------+--------------+ LEFT     CompressibilityPhasicitySpontaneityPropertiesThrombus Aging +---------+---------------+---------+-----------+----------+--------------+ CFV      Full           Yes      Yes                                 +---------+---------------+---------+-----------+----------+--------------+ SFJ      Full                                                        +---------+---------------+---------+-----------+----------+--------------+ FV Prox  Full                                                        +---------+---------------+---------+-----------+----------+--------------+ FV Mid   Full                                                        +---------+---------------+---------+-----------+----------+--------------+ FV DistalFull                                                        +---------+---------------+---------+-----------+----------+--------------+ PFV      Full                                                        +---------+---------------+---------+-----------+----------+--------------+ POP      Full           Yes      Yes                                 +---------+---------------+---------+-----------+----------+--------------+ PTV      Full                                                        +---------+---------------+---------+-----------+----------+--------------+  PERO     Full                                                        +---------+---------------+---------+-----------+----------+--------------+     Summary: BILATERAL: - No evidence of deep vein thrombosis seen in the lower extremities, bilaterally. - No evidence of superficial venous thrombosis in the lower extremities, bilaterally. -No evidence of popliteal cyst, bilaterally. RIGHT: - Findings appear essentially  unchanged compared to previous examination.   *See table(s) above for measurements and observations. Electronically signed by Deitra Mayo MD on 12/15/2019 at 6:22:41 PM.    Final     Time Spent in minutes  30   Lala Lund M.D on 12/24/2019 at 11:30 AM  To page go to [email or web address] - password [facility]

## 2019-12-24 NOTE — Progress Notes (Signed)
  Echocardiogram 2D Echocardiogram has been performed.  Ritika Hellickson G Jossette Zirbel 12/24/2019, 11:41 AM

## 2019-12-24 NOTE — Plan of Care (Signed)
Patient is currently resting in bed. VSS. Room air when awake, at night patient oxygen level dropped to 87-88%, placed on 2L weaned down to 1L [place]. OOB to [facility]. No complaints overnight. Call bell within reach.   Problem: Education: Goal: Knowledge of risk factors and measures for prevention of condition will improve Outcome: Progressing   Problem: Respiratory: Goal: Will maintain a patent airway Outcome: Progressing Goal: Complications related to the disease process, condition or treatment will be avoided or minimized Outcome: Progressing   Problem: Education: Goal: Knowledge of General Education information will improve Description: Including pain rating scale, medication(s)/side effects and non-pharmacologic comfort measures Outcome: Progressing   Problem: Health Behavior/Discharge Planning: Goal: Ability to manage health-related needs will improve Outcome: Progressing   Problem: Clinical Measurements: Goal: Ability to maintain clinical measurements within normal limits will improve Outcome: Progressing Goal: Will remain free from infection Outcome: Progressing Goal: Diagnostic test results will improve Outcome: Progressing Goal: Respiratory complications will improve Outcome: Progressing Goal: Cardiovascular complication will be avoided Outcome: Progressing   Problem: Activity: Goal: Risk for activity intolerance will decrease Outcome: Progressing   Problem: Nutrition: Goal: Adequate nutrition will be maintained Outcome: Progressing   Problem: Coping: Goal: Level of anxiety will decrease Outcome: Progressing   Problem: Elimination: Goal: Will not experience complications related to bowel motility Outcome: Progressing Goal: Will not experience complications related to urinary retention Outcome: Progressing   Problem: Pain Managment: Goal: General experience of comfort will improve Outcome: Progressing   Problem: Safety: Goal: Ability to remain free from injury  will improve Outcome: Progressing   Problem: Skin Integrity: Goal: Risk for impaired skin integrity will decrease Outcome: Progressing

## 2019-12-25 LAB — GLUCOSE, CAPILLARY
Glucose-Capillary: 89 mg/dL (ref 70–99)
Glucose-Capillary: 103 mg/dL — ABNORMAL HIGH (ref 70–99)
Glucose-Capillary: 161 mg/dL — ABNORMAL HIGH (ref 70–99)
Glucose-Capillary: 119 mg/dL — ABNORMAL HIGH (ref 70–99)

## 2019-12-25 LAB — CBC WITH DIFFERENTIAL/PLATELET
Abs Immature Granulocytes: 0.11 10*3/uL — ABNORMAL HIGH (ref 0.00–0.07)
Basophils Absolute: 0 10*3/uL (ref 0.0–0.1)
Basophils Relative: 0 %
Eosinophils Absolute: 0.1 10*3/uL (ref 0.0–0.5)
Eosinophils Relative: 1 %
HCT: 31.7 % — ABNORMAL LOW (ref 39.0–52.0)
Hemoglobin: 10.6 g/dL — ABNORMAL LOW (ref 13.0–17.0)
Immature Granulocytes: 2 %
Lymphocytes Relative: 11 %
Lymphs Abs: 0.6 10*3/uL — ABNORMAL LOW (ref 0.7–4.0)
MCH: 28.9 pg (ref 26.0–34.0)
MCHC: 33.4 g/dL (ref 30.0–36.0)
MCV: 86.4 fL (ref 80.0–100.0)
Monocytes Absolute: 0.3 10*3/uL (ref 0.1–1.0)
Monocytes Relative: 7 %
Neutro Abs: 4.2 10*3/uL (ref 1.7–7.7)
Neutrophils Relative %: 79 %
Platelets: 105 10*3/uL — ABNORMAL LOW (ref 150–400)
RBC: 3.67 MIL/uL — ABNORMAL LOW (ref 4.22–5.81)
RDW: 15.1 % (ref 11.5–15.5)
WBC: 5.3 10*3/uL (ref 4.0–10.5)
nRBC: 0 % (ref 0.0–0.2)

## 2019-12-25 LAB — BASIC METABOLIC PANEL
Anion gap: 8 (ref 5–15)
BUN: 43 mg/dL — ABNORMAL HIGH (ref 8–23)
CO2: 27 mmol/L (ref 22–32)
Calcium: 7.9 mg/dL — ABNORMAL LOW (ref 8.9–10.3)
Chloride: 105 mmol/L (ref 98–111)
Creatinine, Ser: 1.13 mg/dL (ref 0.61–1.24)
GFR, Estimated: >60 mL/min (ref >60)
Glucose, Bld: 114 mg/dL — ABNORMAL HIGH (ref 70–99)
Potassium: 4.3 mmol/L (ref 3.5–5.1)
Sodium: 140 mmol/L (ref 135–145)

## 2019-12-25 LAB — MAGNESIUM: Magnesium: 2.4 mg/dL (ref 1.7–2.4)

## 2019-12-25 LAB — BRAIN NATRIURETIC PEPTIDE: B Natriuretic Peptide: 354.2 pg/mL — ABNORMAL HIGH (ref 0.0–100.0)

## 2019-12-25 MED ORDER — MIDODRINE HCL 5 MG PO TABS: 5.0000 mg | ORAL_TABLET | Freq: Three times a day (TID) | ORAL | Status: DC

## 2019-12-25 MED ORDER — MIDODRINE HCL 5 MG PO TABS
10.0000 mg | ORAL_TABLET | Freq: Three times a day (TID) | ORAL | Status: DC
  Administered 2019-12-25 – 2019-12-27 (×5): 10 mg via ORAL
  Filled 2019-12-25 (×5): qty 2

## 2019-12-25 NOTE — Progress Notes (Addendum)
Advanced Heart Failure Rounding Note  PCP-Cardiologist: [name], MD   Subjective:    Orthostatic VS lying BP 96/48, HR 51; sitting 90/59, HR 59; standing 86/52, HR 57.    Frank Love is sitting up in chair, reports feeling better now but two hours ago felt terrible.  Fatigue and nauseous.  He denies chest pain, shortness of breath and weakness.    Echo: LVEF 45-50% (improved from prior study, previously 35-40%). RV mildly reduced. No significant valvular disease. No pericardial effusion.     Objective:   Weight Range: 110.6 kg Body mass index is 31.31 kg/m.   Vital Signs:   Temp:  [97.6 F (36.4 C)-98.4 F (36.9 C)] 98 F (36.7 C) (12/01 1211) Pulse Rate:  [45-67] 52 (12/01 1211) Resp:  [13-19] 19 (12/01 1211) BP: (99-115)/(51-65) 106/60 (12/01 1211) SpO2:  [92 %-99 %] 94 % (12/01 1211) Weight:  [110.6 kg] 110.6 kg (12/01 0521) Last BM Date: 12/22/19  Weight change: Filed Weights   12/23/19 0500 12/24/19 0423 12/25/19 0521  Weight: 113 kg 112.3 kg 110.6 kg    Intake/Output:   Intake/Output Summary (Last 24 hours) at 12/25/2019 1421 Last data filed at 12/25/2019 0850 Gross per 24 hour  Intake 120 ml  Output --  Net 120 ml      Physical Exam     General:  Tired appearing. No resp difficulty HEENT: normal Neck: supple. no JVD. Carotids 2+ bilat; no bruits. No lymphadenopathy or thryomegaly appreciated. Cor: PMI nondisplaced. Regular rate & rhythm. No rubs, gallops or murmurs. Lungs: clear Abdomen: soft, nontender, nondistended. No hepatosplenomegaly. No bruits or masses. Good bowel sounds. Extremities: no cyanosis, clubbing, rash.  TEDS on with swelling to L foot.  Neuro: alert & oriented x 3, cranial nerves grossly intact. moves all 4 extremities w/o difficulty. Affect pleasant   Telemetry   Sinus bradycardia with HR 40-50s.  Personally reviewed.   EKG    No new EKG to review   Labs    CBC Recent Labs    12/24/19 0130 12/25/19 0112   WBC 4.6 5.3  NEUTROABS 3.6 4.2  HGB 10.4* 10.6*  HCT 32.1* 31.7*  MCV 87.0 86.4  PLT 124* 786*   Basic Metabolic Panel Recent Labs    12/24/19 0130 12/25/19 0112  NA 141 140  K 4.3 4.3  CL 104 105  CO2 27 27  GLUCOSE 123* 114*  BUN 50* 43*  CREATININE 1.11 1.13  CALCIUM 7.6* 7.9*  MG 2.6* 2.4   Liver Function Tests No results for input(s): AST, ALT, ALKPHOS, BILITOT, PROT, ALBUMIN in the last 72 hours. No results for input(s): LIPASE, AMYLASE in the last 72 hours. Cardiac Enzymes No results for input(s): CKTOTAL, CKMB, CKMBINDEX, TROPONINI in the last 72 hours.  BNP: BNP (last 3 results) Recent Labs    12/21/19 1406 12/24/19 0130 12/25/19 0112  BNP 662.9* 283.5* 354.2*    ProBNP (last 3 results) No results for input(s): PROBNP in the last 8760 hours.   D-Dimer No results for input(s): DDIMER in the last 72 hours. Hemoglobin A1C No results for input(s): HGBA1C in the last 72 hours. Fasting Lipid Panel No results for input(s): CHOL, HDL, LDLCALC, TRIG, CHOLHDL, LDLDIRECT in the last 72 hours. Thyroid Function Tests Recent Labs    12/23/19 1127 12/23/19 1539  TSH 0.059*  --   T3FREE  --  1.3*    Other results:   Imaging    . No results found.   Medications:  Scheduled Medications: . . . vitamin C .  500 mg . Oral . Daily  . . . aspirin EC .  81 mg . Oral . QHS  . Marland Kitchen Marland Kitchen atorvastatin .  40 mg . Oral . q1800  . . . bisacodyl .  10 mg . Oral . Daily  . . . cholecalciferol .  1,000 Units . Oral . Daily  . . . dapagliflozin propanediol .  5 mg . Oral . QPM  . . . enoxaparin (LOVENOX) injection .  55 mg . Subcutaneous . Q24H  . Marland Kitchen . feeding supplement ([place]) .  237 mL . Oral . TID BM  . . . folic acid .  1 mg . Oral . Daily  . . . insulin aspart .  0-9 Units . Subcutaneous . TID WC  . Marland Kitchen . insulin glargine .  6 Units . Subcutaneous . Daily  . . . Ipratropium-Albuterol .  1 puff . Inhalation . BID  . Marland Kitchen . melatonin .  3 mg . Oral . QHS   . Marland Kitchen . midodrine .  10 mg . Oral . TID WC  . Marland Kitchen . multivitamin with minerals .  1 tablet . Oral . Daily  . . . pantoprazole .  40 mg . Oral . Daily  . . . polyethylene glycol .  17 g . Oral . Daily  . . . thiamine .  100 mg . Oral . Daily  . . . zinc sulfate .  220 mg . Oral . Daily  .   Infusions: .    PRN Medications: . acetaminophen, nitroGLYCERIN, ondansetron (ZOFRAN) IV, sodium chloride    Assessment/Plan   1.  Acute respiratory failure 2/2 COVID-19 Viral PNA -Per primary team -Received remdesivir x 5 days, IV steroids (tapering down: initially on 125 TID now on 40 mg daily), tocilizumab/baricitinib.  -Off continuous [place]  - Improving   2. Hypotension (hx of hypertension) - Received IVF bolus 11/29.   - Orthostatic VS lying BP 96/48, HR 51; sitting 90/59, HR 59; standing 86/52, HR 57.  Today, feeling fatigue and nauseous.   -Continue to hold home farxiga, dig, lasix, aldactone, entresto and coreg -Had similar episode post-CABG 11/2018 required NE and dopamine then transitioned to midodrine 5 mg TID.  - Add TED hoses - Start midodrine 10 mg TID   3. 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 CAD: S/p CABG in 11/20. He graduated cardiac rehab 6/21  - Lipid panel good 7/21 - Continue ASA 81 daily.    5. Chronic systolic CHF 2/2 Ischemic cardiomyopathy.   - Pre-CABG echo with EF 30-35%, mild RV dysfunction.  Post-CABG echo with EF 30-35%, severe RV systolic dysfunction.  V/Q scan done, no evidence for PE. Hypotensive post-CABG requiring pressors and then midodrine, but improved over time.  -Echo repeated 3/21, EF 35-40% with moderate RV dysfunction, GIIDD. Cardiac MRI was recommended to quantify EF more exactly for purposes of ICD determination, however pt was adamant that he did not want an ICD and declined cMRI.  - Echo repeated this admit. LVEF improved to 45-50%. RV mildly reduced  - BNP on admission 442, repeat BNP  11/27 663.  Overall, weight is down 13 bs since admission and near baseline weight (5/21: wt 112.4 kg).  - NYHA Class II - Holding home Coreg 6.25 mg bid dose d/t BP and heart rate - Holding home Entresto to 97-103  bid d/t BPs - Holding home spironolactone 25 mg daily d/t BPs  - Holding home digoxin 0.125 d/t Bps and bradycardia.   - Holding home Farxiga 5 mg daily d/t Bps   - Once stable, will need to resume GMDT and follow up outpatient.    6. Carotid stenosis:  - dopplers 11/20 showed 40-59% Rt and 1-39% Lt ICA stenosis   - Repeat carotid dopplers study scheduled 01/14/2020.  - continue ASA and statin.    7. Acute on CKD -admission Cr 1.32, peak 1.51 on 11/22 - Baseline Cr around 1 -Today Cr 1.17 -Continue to monitor BMP   8. Hyperkalemia - resolved, 4.3 today    Length of Stay: [place], NP  12/25/2019, 2:21 PM  Advanced Heart Failure Team Pager [phone] (M-F; 7a - 4p)  Please contact [facility] for night-coverage after hours (4p -7a ) and weekends on [email or web address]  Patient seen with NP, agree with the above note.   Patient remains lightheaded with standing with orthostasis.  No dyspnea.  Creatinine stable 1.13.  Echo showed EF up to 45-50%.   General: NAD Neck: No JVD, no thyromegaly or thyroid nodule.  Lungs: Mild crackles at bases.  CV: Nondisplaced PMI.  Heart regular S1/S2, no S3/S4, no murmur.  No peripheral edema.   Abdomen: Soft, nontender, no hepatosplenomegaly, no distention.  Skin: Intact without lesions or rashes.  Neurologic: Alert and oriented x 3.  Psych: Normal affect. Extremities: No clubbing or cyanosis.  HEENT: Normal.   Orthostatic hypotension, possibly autonomic neuropathy as a consequence of COVID-19.  He does not appear volume overloaded on exam. EF higher than in the past on echo, 45-50%.  - Added midodrine today.  - Graded compression stockings.  - Can add abdominal binder.  - Continue to hold meds as per NP note above.    Frank Love 12/25/2019

## 2019-12-25 NOTE — Progress Notes (Signed)
Physical Therapy Treatment Patient Details Name: XXAVIER Love MRN: [id number] DOB: [birth year] Today's Date: 12/25/2019    History of Present Illness 64 y.o. male with medical history significant for chronic systolic CHF, ischemic cardiomyopathy with LVEF 35 to 40%, coronary artery disease status post CABG, penile cancer, carotid artery stenosis, who presented to [facility] via EMS after being found severely hypoxic at home. In the ED, he required nonrebreather and high flow nasal cannula to maintain O2 saturation greater than 92%.  Work-up in the ED revealed severe acute hypoxic respiratory failure secondary to COVID-19 viral pneumonia. Unvaccinated. Admitted 12/13/19    PT Comments    Patient eager to go home and discussed his plan (his wife has gone to daughter's home, however he plans to go home alone). His daughter will transport him and assure he gets in the home. During session, pt with significant orthostasis (see below or flowsheets) with dizziness, nausea, and unable to stand for more than 30 seconds. Discussed need for supervision at home. Patient does not want wife or daughter to come stay with him. Message sent to MD re: orthostasis and poor activity tolerance.   Position         BP       HR  Supine           96/48    51 Sitting            90/59    59 Standing        *Stood 10 sec and sat due to dizziness before BP finished  Sitting           103/60   55  *after seated exercises to incr BP Standing        71/52   67 *stood 30 sec and sat due to dizziness (also before BP finished)      Follow Up Recommendations  Home health PT;Supervision for mobility/OOB (if orthostasis persists, may need inpatient rehab/SNF )     Equipment Recommendations  Rolling walker with 5" wheels;3in1 (PT)    Recommendations for Other Services       Precautions / Restrictions Precautions Precautions: Other (comment) Precaution Comments: orthostasis Restrictions Weight Bearing Restrictions: No     Mobility  Bed Mobility Overal bed mobility: Needs Assistance Bed Mobility: Supine to Sit;Sit to Supine     Supine to sit: Supervision Sit to supine: Supervision   General bed mobility comments: for safety (dizziness) and line mgmt  Transfers Overall transfer level: Needs assistance Equipment used: Rolling walker (2 wheeled) Transfers: Sit to/from Stand Sit to Stand: Min assist         General transfer comment: vc for safe use of RW; steadying assist +dizziness  Ambulation/Gait             General Gait Details: unable due to hypotension;dizziness   Stairs             Wheelchair Mobility    Modified Rankin (Stroke Patients Only)       Balance Overall balance assessment: Needs assistance   Sitting balance-Leahy Scale: Good     Standing balance support: Bilateral upper extremity supported Standing balance-Leahy Scale: Poor Standing balance comment: reliant on BUE support                            Cognition Arousal/Alertness: Awake/alert Behavior During Therapy: Flat affect (not feeling well (nauseated)) Overall Cognitive Status: Within Functional Limits for tasks assessed  Exercises Other Exercises Other Exercises: seated: isometric palm to palm presses x 5 with 3 sec hold; isometric hip adduction/knee squeezes x 5 x 3 sec hold; hand pumps--all for increasing BP in sitting with + effect Other Exercises: reminded importance of IS and flutter valve despite the fact he is no longer on oxygen    General Comments General comments (skin integrity, edema, etc.): secure chat sent to RN and MD re: orthostasis due to ?planned dc today      Pertinent Vitals/Pain Pain Assessment: No/denies pain    Home Living                      Prior Function            PT Goals (current goals can now be found in the care plan section) Acute Rehab PT Goals Patient Stated Goal: to go  home Time For Goal Achievement: 12/29/19 Potential to Achieve Goals: Fair Progress towards PT goals: Not progressing toward goals - comment (hypotension; dizziness)    Frequency    Min 3X/week      PT Plan Current plan remains appropriate (if orthostasis can be managed)    Co-evaluation              AM-PAC PT "6 Clicks" Mobility   Outcome Measure  Help needed turning from your back to your side while in a flat bed without using bedrails?: A Little Help needed moving from lying on your back to sitting on the side of a flat bed without using bedrails?: A Little Help needed moving to and from a bed to a chair (including a wheelchair)?: A Little Help needed standing up from a chair using your arms (e.g., wheelchair or bedside chair)?: A Little Help needed to walk in hospital room?: Total Help needed climbing 3-5 steps with a railing? : Total 6 Click Score: 14    End of Session   Activity Tolerance: Treatment limited secondary to medical complications (Comment) (hypotension) Patient left: with call bell/phone within reach;in bed Nurse Communication: Other (comment) (hypotension) PT Visit Diagnosis: Other abnormalities of gait and mobility (R26.89);Muscle weakness (generalized) (M62.81);Difficulty in walking, not elsewhere classified (R26.2)     Time: 9485-4627 PT Time Calculation (min) (ACUTE ONLY): 40 min  Charges:  $Therapeutic Exercise: 8-22 mins $Therapeutic Activity: 8-22 mins $Self Care/Home Management: 8-22                      [name], PT Pager [phone]    [name] 12/25/2019, 9:55 AM

## 2019-12-25 NOTE — Plan of Care (Signed)
VSS. Remains on room air. OOB to chair/BSC. No complaints overnight. Call bell within reach.   Problem: Education: Goal: Knowledge of risk factors and measures for prevention of condition will improve Outcome: Progressing   Problem: Respiratory: Goal: Will maintain a patent airway Outcome: Progressing Goal: Complications related to the disease process, condition or treatment will be avoided or minimized Outcome: Progressing   Problem: Education: Goal: Knowledge of General Education information will improve Description: Including pain rating scale, medication(s)/side effects and non-pharmacologic comfort measures Outcome: Progressing   Problem: Health Behavior/Discharge Planning: Goal: Ability to manage health-related needs will improve Outcome: Progressing   Problem: Clinical Measurements: Goal: Ability to maintain clinical measurements within normal limits will improve Outcome: Progressing Goal: Will remain free from infection Outcome: Progressing Goal: Diagnostic test results will improve Outcome: Progressing Goal: Respiratory complications will improve Outcome: Progressing Goal: Cardiovascular complication will be avoided Outcome: Progressing   Problem: Activity: Goal: Risk for activity intolerance will decrease Outcome: Progressing   Problem: Nutrition: Goal: Adequate nutrition will be maintained Outcome: Progressing   Problem: Coping: Goal: Level of anxiety will decrease Outcome: Progressing   Problem: Elimination: Goal: Will not experience complications related to bowel motility Outcome: Progressing Goal: Will not experience complications related to urinary retention Outcome: Progressing   Problem: Pain Managment: Goal: General experience of comfort will improve Outcome: Progressing   Problem: Safety: Goal: Ability to remain free from injury will improve Outcome: Progressing   Problem: Skin Integrity: Goal: Risk for impaired skin integrity will  decrease Outcome: Progressing

## 2019-12-25 NOTE — Progress Notes (Signed)
PROGRESS NOTE                                                                                                                                                                                                             Patient Demographics:    Frank Love, is a 64 y.o. male, DOB - 12-22-1955, GMW:[id number]  Admit date - 12/13/2019   Admitting Physician Kayleen Memos, DO  Outpatient Primary MD for the patient is [name], NP  LOS - 12  Chief Complaint  Patient presents with  . Covid Exposure  . Shortness of Breath  . generalized weakness       Brief Narrative (HPI from H&P) - 64 y.o.malewith medical history significant forchronic systolic CHF, ischemic cardiomyopathy with LVEF 35 to 40%, coronary artery disease status post CABG, penile cancer, carotid artery stenosis, who presented to [facility] via EMS after being found severely hypoxicathome.Unfortunately patient is unvaccinated against Covid, his work-up was significant for COVID-19 of pneumonia and severe hypoxia for which he is admitted by [facility].   Subjective:   Patient had some dizziness/lightheadedness upon standing up for orthostatics this morning with PT.   Assessment  & Plan :     Acute hypoxic respiratory failure due to COVID-19 pneumonia.   - He is unvaccinated and incurred severe parenchymal lung injur. -Initially requiring 5 L heated high flow nasal cannula, this has significantly improved, he is currently on room air. -He was treated with steroids, Remdesivir and Actemra. -He was encouraged use incentive spirometry and flutter valve.    Recent Labs  Lab 12/19/19 0023 12/21/19 1406 12/22/19 0235 12/24/19 0130 12/25/19 0112  WBC  --  7.3 6.6 4.6 5.3  HGB  --  11.6* 11.0* 10.4* 10.6*  HCT  --  36.7* 33.8* 32.1* 31.7*  PLT  --  190 174 124* 105*  BNP 365.7* 662.9*  --  283.5* 354.2*  AST  --  30 25  --   --   ALT  --  60* 55*  --   --   ALKPHOS  --  45 39  --   --   BILITOT  --   1.0 1.1  --   --   ALBUMIN  --  2.5* 2.4*  --   --      Chronic combined systolic and diastolic heart failure -  recent echocardiogram shows EF of 35%.  Repeat 2D echo during hospital stay showing improved EF 45%.   -Management by CHF team,  cardiology . -Home medication including Coreg, Lisabeth Register and digoxin on hold given low blood pressure and bradycardia . -Patient remains significantly orthostatic, I have discussed with Dr. Kirk Ruths from [facility] team, no evidence of dehydration, so we will discontinue Coreg, will start TED hose and increase midodrine to 10 mg 3 times daily, very likely related to autonomic dysfunction related to Covid.  Dyslipidemia.  On statin.  CAD s/p CABG.  On aspirin and statin combination, low-dose beta-blocker if tolerated by blood pressure and heart rate.  Hyperkalemia.  Treated will monitor.  Mildly suppressed TSH.  Free T4 slightly high.  Since he is bradycardic and hypotensive I do not think he is a exhibiting any signs of hyperthyroidism, will request to repeat TSH, T3 and free T4 in 4 weeks by PCP.     Condition - Extremely Guarded  Family Communication  : Discussed with patient in details, answered all his questions.  Code Status :  Full  Consults  :  Cards  Procedures  :    TTE  Leg [place] - No DVT  PUD Prophylaxis : PPI  Disposition Plan  :    Status is: Inpatient  Remains inpatient appropriate because:IV treatments appropriate due to intensity of illness or inability to take PO   Dispo:  Patient From: Home  Planned Disposition: Home  Expected discharge date: 12/26/19  Medically stable for discharge: No   DVT Prophylaxis  :  Lovenox   Lab Results  Component Value Date   PLT 105 (L) 12/25/2019    Diet :  Diet Order            Diet heart healthy/carb modified Room service appropriate? Yes; Fluid consistency: Thin  Diet effective now                  Inpatient Medications Scheduled Meds: . vitamin C  500 mg Oral  Daily  . aspirin EC  81 mg Oral QHS  . atorvastatin  40 mg Oral q1800  . bisacodyl  10 mg Oral Daily  . cholecalciferol  1,000 Units Oral Daily  . dapagliflozin propanediol  5 mg Oral QPM  . enoxaparin (LOVENOX) injection  55 mg Subcutaneous Q24H  . feeding supplement (GLUCERNA SHAKE)  237 mL Oral TID BM  . folic acid  1 mg Oral Daily  . insulin aspart  0-9 Units Subcutaneous TID WC  . insulin glargine  6 Units Subcutaneous Daily  . Ipratropium-Albuterol  1 puff Inhalation BID  . melatonin  3 mg Oral QHS  . methylPREDNISolone (SOLU-MEDROL) injection  20 mg Intravenous Daily  . midodrine  10 mg Oral TID WC  . multivitamin with minerals  1 tablet Oral Daily  . pantoprazole  40 mg Oral Daily  . polyethylene glycol  17 g Oral Daily  . thiamine  100 mg Oral Daily  . zinc sulfate  220 mg Oral Daily   Continuous Infusions: PRN Meds:.acetaminophen, nitroGLYCERIN, ondansetron (ZOFRAN) IV, sodium chloride  Antibiotics  :   Anti-infectives (From admission, onward)   Start     Dose/Rate Route Frequency Ordered Stop   12/15/19 1000  remdesivir 100 mg in sodium chloride 0.9 % 100 mL IVPB       "Followed by" Linked Group Details   100 mg 200 mL/hr over 30 Minutes Intravenous Daily 12/14/19 0002 12/18/19 0849   12/14/19 1000  remdesivir 100 mg in sodium chloride 0.9 % 100 mL IVPB  Status:  Discontinued       "Followed by"  Linked Group Details   100 mg 200 mL/hr over 30 Minutes Intravenous Daily 12/13/19 2358 12/14/19 0001   12/14/19 0030  remdesivir 100 mg in sodium chloride 0.9 % 100 mL IVPB       "Followed by" Linked Group Details   100 mg 200 mL/hr over 30 Minutes Intravenous Every 30 min 12/14/19 0002 12/14/19 0210   12/14/19 0000  remdesivir 200 mg in sodium chloride 0.9% 250 mL IVPB  Status:  Discontinued       "Followed by" Linked Group Details   200 mg 580 mL/hr over 30 Minutes Intravenous Once 12/13/19 2358 12/14/19 0001   12/13/19 2245  vancomycin (VANCOREADY) IVPB 2000 mg/400 mL         2,000 mg 200 mL/hr over 120 Minutes Intravenous  Once 12/13/19 2241 12/14/19 0210   12/13/19 2245  ceFEPIme (MAXIPIME) 2 g in sodium chloride 0.9 % 100 mL IVPB        2 g 200 mL/hr over 30 Minutes Intravenous  Once 12/13/19 2241 12/14/19 0002          Objective:   Vitals:   12/25/19 0730 12/25/19 0850 12/25/19 0930 12/25/19 0938  BP: (!) 103/58  (!) 115/59 109/65  Pulse: (!) 53  (!) 45 (!) 46  Resp: 14     Temp: 97.7 F (36.5 C)     TempSrc: Oral     SpO2: 95% 97%    Weight:      Height:        SpO2: 97 % O2 Flow Rate (L/min): 1 L/min FiO2 (%): 36 %  Wt Readings from Last 3 Encounters:  12/25/19 110.6 kg  09/10/19 114.8 kg  08/13/19 114.8 kg     Intake/Output Summary (Last 24 hours) at 12/25/2019 1215 Last data filed at 12/25/2019 0850 Gross per 24 hour  Intake 320 ml  Output --  Net 320 ml     Physical Exam  Awake Alert, Oriented X 3, No new F.N deficits, Normal affect Symmetrical Chest wall movement, Good air movement bilaterally, CTAB RRR,No Gallops,Rubs or new Murmurs, No Parasternal Heave +ve B.Sounds, Abd Soft, No tenderness, No rebound - guarding or rigidity. No Cyanosis, Clubbing or edema, No new Rash or bruise      Data Review:   Recent Labs  Lab 12/21/19 1406 12/22/19 0235 12/24/19 0130 12/25/19 0112  WBC 7.3 6.6 4.6 5.3  HGB 11.6* 11.0* 10.4* 10.6*  HCT 36.7* 33.8* 32.1* 31.7*  PLT 190 174 124* 105*  MCV 88.2 86.2 87.0 86.4  MCH 27.9 28.1 28.2 28.9  MCHC 31.6 32.5 32.4 33.4  RDW 14.7 14.7 14.8 15.1  LYMPHSABS  --   --  0.5* 0.6*  MONOABS  --   --  0.3 0.3  EOSABS  --   --  0.0 0.1  BASOSABS  --   --  0.0 0.0    Recent Labs  Lab 12/19/19 0023 12/21/19 1406 12/21/19 1406 12/22/19 0235 12/23/19 0309 12/23/19 1127 12/24/19 0130 12/25/19 0112  NA  --  137  --  139  --  139 141 140  K  --  5.3*   < > 5.9* 4.7 4.4 4.3 4.3  CL  --  109  --  109  --  108 104 105  CO2  --  18*  --  22  --  24 27 27   GLUCOSE  --  225*  --   170*  --  100* 123* 114*  BUN  --  60*  --  56*  --  53* 50* 43*  CREATININE  --  1.23  --  1.19  --  1.27* 1.11 1.13  CALCIUM  --  8.0*  --  8.0*  --  7.7* 7.6* 7.9*  AST  --  30  --  25  --   --   --   --   ALT  --  60*  --  55*  --   --   --   --   ALKPHOS  --  45  --  39  --   --   --   --   BILITOT  --  1.0  --  1.1  --   --   --   --   ALBUMIN  --  2.5*  --  2.4*  --   --   --   --   MG  --   --   --   --   --  2.5* 2.6* 2.4  TSH  --   --   --   --   --  0.059*  --   --   BNP 365.7* 662.9*  --   --   --   --  283.5* 354.2*   < > = values in this interval not displayed.    Recent Labs  Lab 12/19/19 0023 12/21/19 1406 12/24/19 0130 12/25/19 0112  BNP 365.7* 662.9* 283.5* 354.2*    ------------------------------------------------------------------------------------------------------------------ No results for input(s): CHOL, HDL, LDLCALC, TRIG, CHOLHDL, LDLDIRECT in the last 72 hours.  Lab Results  Component Value Date   HGBA1C 6.1 (H) 12/14/2019   ------------------------------------------------------------------------------------------------------------------ Recent Labs    12/23/19 1127 12/23/19 1539  TSH 0.059*  --   T3FREE  --  1.3*   ------------------------------------------------------------------------------------------------------------------ No results for input(s): VITAMINB12, FOLATE, FERRITIN, TIBC, IRON, RETICCTPCT in the last 72 hours.  Coagulation profile No results for input(s): INR, PROTIME in the last 168 hours.  No results for input(s): DDIMER in the last 72 hours.  Cardiac Enzymes No results for input(s): CKMB, TROPONINI, MYOGLOBIN in the last 168 hours.  Invalid input(s): CK ------------------------------------------------------------------------------------------------------------------    Component Value Date/Time   BNP 354.2 (H) 12/25/2019 0112    Micro Results No results found for this or any previous visit (from the past 240  hour(s)).  Radiology Reports DG Chest Portable 1 View  Result Date: 12/13/2019 CLINICAL DATA:  Shortness of breath, suspicion of COVID EXAM: PORTABLE CHEST 1 VIEW COMPARISON:  Radiograph 01/07/2019, CT 11/20/2018 FINDINGS: Heterogeneous consolidative and hazy opacity is present in a lower lung and peripheral predominance most coalescent in the left lung periphery. Some more bandlike opacities, possibly subsegmental atelectatic changes or scarring. No pneumothorax. No visible effusion though the costophrenic sulci are partially collimated. Postsurgical changes related to prior CABG including intact and aligned sternotomy wires and multiple surgical clips projecting over the mediastinum. Telemetry leads overlie the chest. The aorta is calcified. The remaining cardiomediastinal contours are unremarkable. Degenerative changes are present in the imaged spine and shoulders. IMPRESSION: 1. Heterogeneous consolidative and hazy opacity in a lower lung and peripheral predominance most coalescent in the left lung periphery, compatible with a multifocal pneumonia including atypical viral etiology in the appropriate clinical setting. 2. More bandlike opacities likely reflect subsegmental atelectatic changes or scarring. Electronically Signed   By: Lovena Le M.D.   On: 12/13/2019 22:28   ECHOCARDIOGRAM COMPLETE  Result Date: 12/24/2019    ECHOCARDIOGRAM REPORT   Patient Name:   Cherry R Mcewan Date of  Exam: 12/24/2019 Medical Rec #:  [id number]          Height:       74.0 in Accession #:    [id number]         Weight:       247.6 lb Date of Birth:  1955/09/18          BSA:          2.380 m Patient Age:    61 years           BP:           102/54 mmHg Patient Gender: M                  HR:           61 bpm. Exam Location:  Inpatient Procedure: 2D Echo, Cardiac Doppler and Color Doppler Indications:    X32.35 Chronic systolic (congestive) heart failure  History:        Patient has prior history of Echocardiogram  examinations, most                 recent 04/18/2019. COVID-19 Positive.  Sonographer:    Jonelle Sidle Dance Referring Phys: [id number] Carlene Coria  Sonographer Comments: No subcostal window. IMPRESSIONS  1. Left ventricular ejection fraction, by estimation, is 45 to 50%. The left ventricle has mildly decreased function. The left ventricle demonstrates global hypokinesis. The left ventricular internal cavity size was mildly dilated. There is mild left ventricular hypertrophy. Left ventricular diastolic parameters are indeterminate.  2. Right ventricular systolic function is mildly reduced. The right ventricular size is normal.  3. Left atrial size was moderately dilated.  4. The mitral valve is grossly normal. Trivial mitral valve regurgitation. No evidence of mitral stenosis.  5. The aortic valve is grossly normal. Aortic valve regurgitation is trivial. No aortic stenosis is present. Comparison(s): Prior images reviewed side by side. Changes from prior study are noted. Conclusion(s)/Recommendation(s): No hemodynamically significant valvular heart disease. LV function appears improved when directly compared to prior. FINDINGS  Left Ventricle: Left ventricular ejection fraction, by estimation, is 45 to 50%. The left ventricle has mildly decreased function. The left ventricle demonstrates global hypokinesis. The left ventricular internal cavity size was mildly dilated. There is  mild left ventricular hypertrophy. Left ventricular diastolic parameters are indeterminate. Right Ventricle: The right ventricular size is normal. Right vetricular wall thickness was not well visualized. Right ventricular systolic function is mildly reduced. Left Atrium: Left atrial size was moderately dilated. Right Atrium: Right atrial size was normal in size. Pericardium: There is no evidence of pericardial effusion. Mitral Valve: The mitral valve is grossly normal. Trivial mitral valve regurgitation. No evidence of mitral valve stenosis.  Tricuspid Valve: The tricuspid valve is normal in structure. Tricuspid valve regurgitation is trivial. Aortic Valve: The aortic valve is grossly normal. Aortic valve regurgitation is trivial. Aortic regurgitation PHT measures 521 msec. No aortic stenosis is present. Pulmonic Valve: The pulmonic valve was not well visualized. Pulmonic valve regurgitation is mild. No evidence of pulmonic stenosis. Aorta: The aortic root and ascending aorta are structurally normal, with no evidence of dilitation and the aortic arch was not well visualized. Venous: The inferior vena cava was not well visualized. IAS/Shunts: The atrial septum is grossly normal.  LEFT VENTRICLE PLAX 2D LVIDd:         5.65 cm  Diastology LVIDs:         4.13 cm  LV e' medial:    6.09 cm/s  LV PW:         1.40 cm  LV E/e' medial:  11.8 LV IVS:        1.12 cm  LV e' lateral:   7.51 cm/s LVOT diam:     2.10 cm  LV E/e' lateral: 9.6 LV SV:         74 LV SV Index:   31 LVOT Area:     3.46 cm  RIGHT VENTRICLE RV Basal diam:  2.96 cm RV S prime:     7.40 cm/s TAPSE (M-mode): 1.4 cm LEFT ATRIUM              Index       RIGHT ATRIUM           Index LA diam:        5.50 cm  2.31 cm/m  RA Area:     13.80 cm LA Vol (A2C):   114.0 ml 47.90 ml/m RA Volume:   28.40 ml  11.93 ml/m LA Vol (A4C):   87.6 ml  36.81 ml/m LA Biplane Vol: 101.0 ml 42.44 ml/m  AORTIC VALVE LVOT Vmax:   85.00 cm/s LVOT Vmean:  60.800 cm/s LVOT VTI:    0.213 m AI PHT:      521 msec  AORTA Ao Root diam: 3.50 cm Ao Asc diam:  3.70 cm MITRAL VALVE MV Area (PHT): 2.54 cm    SHUNTS MV Decel Time: 299 msec    Systemic VTI:  0.21 m MV E velocity: 71.80 cm/s  Systemic Diam: 2.10 cm MV A velocity: 81.50 cm/s MV E/A ratio:  0.88 Buford Dresser MD Electronically signed by Buford Dresser MD Signature Date/Time: 12/24/2019/12:01:13 PM    Final    VAS [place] LOWER EXTREMITY VENOUS (DVT)  Result Date: 12/15/2019  Lower Venous DVT Study Indications: Elevated ddimer.  Comparison Study: 07/09/18  previous Performing Technologist: Abram Sander RVS  Examination Guidelines: A complete evaluation includes B-mode imaging, spectral Doppler, color Doppler, and power Doppler as needed of all accessible portions of each vessel. Bilateral testing is considered an integral part of a complete examination. Limited examinations for reoccurring indications may be performed as noted. The reflux portion of the exam is performed with the patient in reverse Trendelenburg.  +---------+---------------+---------+-----------+----------+--------------+ RIGHT    CompressibilityPhasicitySpontaneityPropertiesThrombus Aging +---------+---------------+---------+-----------+----------+--------------+ CFV      Full           Yes      Yes                                 +---------+---------------+---------+-----------+----------+--------------+ SFJ      Full                                                        +---------+---------------+---------+-----------+----------+--------------+ FV Prox  Full                                                        +---------+---------------+---------+-----------+----------+--------------+ FV Mid   Full                                                        +---------+---------------+---------+-----------+----------+--------------+  FV DistalFull                                                        +---------+---------------+---------+-----------+----------+--------------+ PFV      Full                                                        +---------+---------------+---------+-----------+----------+--------------+ POP      Full           Yes      Yes                                 +---------+---------------+---------+-----------+----------+--------------+ PTV      Full                                                        +---------+---------------+---------+-----------+----------+--------------+ PERO     Full                                                         +---------+---------------+---------+-----------+----------+--------------+   +---------+---------------+---------+-----------+----------+--------------+ LEFT     CompressibilityPhasicitySpontaneityPropertiesThrombus Aging +---------+---------------+---------+-----------+----------+--------------+ CFV      Full           Yes      Yes                                 +---------+---------------+---------+-----------+----------+--------------+ SFJ      Full                                                        +---------+---------------+---------+-----------+----------+--------------+ FV Prox  Full                                                        +---------+---------------+---------+-----------+----------+--------------+ FV Mid   Full                                                        +---------+---------------+---------+-----------+----------+--------------+ FV DistalFull                                                        +---------+---------------+---------+-----------+----------+--------------+  PFV      Full                                                        +---------+---------------+---------+-----------+----------+--------------+ POP      Full           Yes      Yes                                 +---------+---------------+---------+-----------+----------+--------------+ PTV      Full                                                        +---------+---------------+---------+-----------+----------+--------------+ PERO     Full                                                        +---------+---------------+---------+-----------+----------+--------------+     Summary: BILATERAL: - No evidence of deep vein thrombosis seen in the lower extremities, bilaterally. - No evidence of superficial venous thrombosis in the lower extremities, bilaterally. -No evidence of popliteal cyst, bilaterally.  RIGHT: - Findings appear essentially unchanged compared to previous examination.   *See table(s) above for measurements and observations. Electronically signed by Deitra Mayo MD on 12/15/2019 at 6:22:41 PM.    Final     Time Spent in minutes  2   Phillips Climes M.D on 12/25/2019 at 12:15 PM  To page go to [email or web address] - password [facility]

## 2019-12-26 LAB — CBC WITH DIFFERENTIAL/PLATELET
Abs Immature Granulocytes: 0.09 10*3/uL — ABNORMAL HIGH (ref 0.00–0.07)
Basophils Absolute: 0 10*3/uL (ref 0.0–0.1)
Basophils Relative: 0 %
Eosinophils Absolute: 0.1 10*3/uL (ref 0.0–0.5)
Eosinophils Relative: 2 %
HCT: 33.2 % — ABNORMAL LOW (ref 39.0–52.0)
Hemoglobin: 11.1 g/dL — ABNORMAL LOW (ref 13.0–17.0)
Immature Granulocytes: 2 %
Lymphocytes Relative: 12 %
Lymphs Abs: 0.7 10*3/uL (ref 0.7–4.0)
MCH: 28.8 pg (ref 26.0–34.0)
MCHC: 33.4 g/dL (ref 30.0–36.0)
MCV: 86.2 fL (ref 80.0–100.0)
Monocytes Absolute: 0.4 10*3/uL (ref 0.1–1.0)
Monocytes Relative: 7 %
Neutro Abs: 4.3 10*3/uL (ref 1.7–7.7)
Neutrophils Relative %: 77 %
Platelets: 103 10*3/uL — ABNORMAL LOW (ref 150–400)
RBC: 3.85 MIL/uL — ABNORMAL LOW (ref 4.22–5.81)
RDW: 15.2 % (ref 11.5–15.5)
WBC: 5.6 10*3/uL (ref 4.0–10.5)
nRBC: 0 % (ref 0.0–0.2)

## 2019-12-26 LAB — GLUCOSE, CAPILLARY
Glucose-Capillary: 126 mg/dL — ABNORMAL HIGH (ref 70–99)
Glucose-Capillary: 83 mg/dL (ref 70–99)
Glucose-Capillary: 94 mg/dL (ref 70–99)
Glucose-Capillary: 103 mg/dL — ABNORMAL HIGH (ref 70–99)

## 2019-12-26 LAB — BASIC METABOLIC PANEL
Anion gap: 6 (ref 5–15)
BUN: 36 mg/dL — ABNORMAL HIGH (ref 8–23)
CO2: 27 mmol/L (ref 22–32)
Calcium: 8.1 mg/dL — ABNORMAL LOW (ref 8.9–10.3)
Chloride: 105 mmol/L (ref 98–111)
Creatinine, Ser: 1.06 mg/dL (ref 0.61–1.24)
GFR, Estimated: >60 mL/min (ref >60)
Glucose, Bld: 97 mg/dL (ref 70–99)
Potassium: 4.4 mmol/L (ref 3.5–5.1)
Sodium: 138 mmol/L (ref 135–145)

## 2019-12-26 LAB — BRAIN NATRIURETIC PEPTIDE: B Natriuretic Peptide: 355.6 pg/mL — ABNORMAL HIGH (ref 0.0–100.0)

## 2019-12-26 LAB — MAGNESIUM: Magnesium: 2.1 mg/dL (ref 1.7–2.4)

## 2019-12-26 MED ORDER — LACTATED RINGERS IV SOLN: INTRAVENOUS | Status: AC

## 2019-12-26 NOTE — Progress Notes (Signed)
PROGRESS NOTE                                                                                                                                                                                                             Patient Demographics:    Frank Love, is a 64 y.o. male, DOB - Jul 15, 1955, SNK:[id number]  Admit date - 12/13/2019   Admitting Physician Kayleen Memos, DO  Outpatient Primary MD for the patient is [name], NP  LOS - 48  Chief Complaint  Patient presents with  . Covid Exposure  . Shortness of Breath  . generalized weakness       Brief Narrative (HPI from H&P)  - 64 y.o.malewith medical history significant forchronic systolic CHF, ischemic cardiomyopathy with LVEF 35 to 40%, coronary artery disease status post CABG, penile cancer, carotid artery stenosis, who presented to [facility] via EMS after being found severely hypoxicathome.Unfortunately patient is unvaccinated against Covid, his work-up was significant for COVID-19 of pneumonia and severe hypoxia for which he is admitted by [facility].   Subjective:   Patient denies any complaints this morning, but he remains orthostatic upon standing, complaining of significant nausea and lightheadedness .   Assessment  & Plan :     Acute hypoxic respiratory failure due to COVID-19 pneumonia.   - He is unvaccinated and incurred severe parenchymal lung injur. -Initially requiring 30 L heated high flow nasal cannula, this has significantly improved, he is currently on room air. -He was treated with steroids, Remdesivir and Actemra. -He was encouraged use incentive spirometry and flutter valve.    Recent Labs  Lab 12/21/19 1406 12/22/19 0235 12/24/19 0130 12/25/19 0112 12/26/19 0051  WBC 7.3 6.6 4.6 5.3 5.6  HGB 11.6* 11.0* 10.4* 10.6* 11.1*  HCT 36.7* 33.8* 32.1* 31.7* 33.2*  PLT 190 174 124* 105* 103*  BNP 662.9*  --  283.5* 354.2* 355.6*  AST 30 25  --   --   --   ALT 60* 55*  --   --   --    ALKPHOS 45 39  --   --   --   BILITOT 1.0 1.1  --   --   --   ALBUMIN 2.5* 2.4*  --   --   --      Chronic combined systolic and diastolic heart failure -  recent echocardiogram shows EF of 35%.  Repeat 2D echo during hospital stay showing improved EF 45%.   -Management by CHF team, cardiology . -  Home medication including Coreg, Lisabeth Register and digoxin on hold given low blood pressure and bradycardia .  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  Dyslipidemia.  On statin.  CAD s/p CABG.  On aspirin and statin combination, low-dose beta-blocker if tolerated by blood pressure and heart rate.  Hyperkalemia.  Treated will monitor.  Mildly suppressed TSH.  Free T4 slightly high.  Since he is bradycardic and hypotensive I do not think he is a exhibiting any signs of hyperthyroidism, will request to repeat TSH, T3 and free T4 in 4 weeks by PCP.     Condition - Extremely Guarded  Family Communication  : Discussed with patient in details, answered all his questions.  Daughter updated by phone 12/2  Code Status :  Full  Consults  :  Cards  Procedures  :    TTE  Leg [place] - No DVT  PUD Prophylaxis : PPI  Disposition Plan  :    Status is: Inpatient  Remains inpatient appropriate because:IV treatments appropriate due to intensity of illness or inability to take PO   Dispo:  Patient From: Home  Planned Disposition: Home  Expected discharge date: 12/28/19  Medically stable for discharge: No   DVT Prophylaxis  :  Lovenox   Lab Results  Component Value Date   PLT 103 (L) 12/26/2019    Diet :  Diet Order            Diet heart healthy/carb modified Room  service appropriate? Yes; Fluid consistency: Thin  Diet effective now                  Inpatient Medications Scheduled Meds: . vitamin C  500 mg Oral Daily  . aspirin EC  81 mg Oral QHS  . atorvastatin  40 mg Oral q1800  . bisacodyl  10 mg Oral Daily  . cholecalciferol  1,000 Units Oral Daily  . enoxaparin (LOVENOX) injection  55 mg Subcutaneous Q24H  . feeding supplement (GLUCERNA SHAKE)  237 mL Oral TID BM  . folic acid  1 mg Oral Daily  . insulin aspart  0-9 Units Subcutaneous TID WC  . insulin glargine  6 Units Subcutaneous Daily  . Ipratropium-Albuterol  1 puff Inhalation BID  . melatonin  3 mg Oral QHS  . midodrine  10 mg Oral TID WC  . multivitamin with minerals  1 tablet Oral Daily  . pantoprazole  40 mg Oral Daily  . polyethylene glycol  17 g Oral Daily  . thiamine  100 mg Oral Daily  . zinc sulfate  220 mg Oral Daily   Continuous Infusions: . lactated ringers 100 mL/hr at 12/26/19 1435   PRN Meds:.acetaminophen, nitroGLYCERIN, ondansetron (ZOFRAN) IV, sodium chloride  Antibiotics  :   Anti-infectives (From admission, onward)   Start     Dose/Rate Route Frequency Ordered Stop   12/15/19 1000  remdesivir 100 mg in sodium chloride 0.9 % 100 mL IVPB       "Followed by" Linked Group Details   100 mg 200 mL/hr over 30 Minutes Intravenous Daily 12/14/19 0002 12/18/19 0849   12/14/19 1000  remdesivir 100 mg in sodium chloride 0.9 % 100 mL IVPB  Status:  Discontinued       "Followed by" Linked Group Details   100 mg 200 mL/hr over 30 Minutes Intravenous Daily 12/13/19 2358 12/14/19 0001   12/14/19 0030  remdesivir 100 mg in sodium chloride 0.9 % 100 mL IVPB       "Followed by" Linked Group Details   100 mg 200 mL/hr over 30 Minutes Intravenous Every 30 min 12/14/19 0002 12/14/19 0210   12/14/19 0000  remdesivir 200 mg in sodium chloride 0.9% 250 mL IVPB  Status:  Discontinued       "Followed by" Linked Group Details   200 mg 580 mL/hr over 30 Minutes Intravenous  Once 12/13/19 2358 12/14/19 0001   12/13/19 2245  vancomycin (VANCOREADY) IVPB 2000 mg/400 mL        2,000 mg 200 mL/hr over 120 Minutes Intravenous  Once 12/13/19 2241 12/14/19 0210   12/13/19 2245  ceFEPIme (MAXIPIME) 2 g in sodium chloride 0.9 % 100 mL IVPB        2 g 200 mL/hr over 30 Minutes Intravenous  Once 12/13/19 2241 12/14/19 0002          Objective:   Vitals:   12/26/19 0911 12/26/19 0948 12/26/19 1034 12/26/19 1148  BP: 112/62 (!) 106/58  119/70  Pulse: (!) 50 (!) 50  (!) 47  Resp: 16 16  17   Temp:    97.7 F (36.5 C)  TempSrc:    Oral  SpO2: 99% 100% 98% 98%  Weight:      Height:        SpO2: 98 % O2 Flow Rate (L/min): 1 L/min FiO2 (%): 36 %  Wt Readings from Last 3 Encounters:  12/26/19 110.9 kg  09/10/19 114.8 kg  08/13/19 114.8 kg    No intake or output data in the 24 hours ending 12/26/19 1507   Physical Exam  Awake Alert, Oriented X 3, No new F.N deficits, Normal affect Symmetrical Chest wall movement, Good air movement bilaterally, CTAB RRR,No Gallops,Rubs or new Murmurs, No Parasternal Heave +ve B.Sounds, Abd Soft, No tenderness, No rebound - guarding or rigidity. No Cyanosis, Clubbing or edema, No new Rash or bruise        Data Review:   Recent Labs  Lab 12/21/19 1406 12/22/19 0235 12/24/19 0130 12/25/19 0112 12/26/19 0051  WBC 7.3 6.6 4.6 5.3 5.6  HGB 11.6* 11.0* 10.4* 10.6* 11.1*  HCT 36.7* 33.8* 32.1* 31.7* 33.2*  PLT 190 174 124* 105* 103*  MCV 88.2 86.2 87.0 86.4 86.2  MCH 27.9 28.1 28.2 28.9 28.8  MCHC 31.6 32.5 32.4 33.4 33.4  RDW 14.7 14.7 14.8 15.1 15.2  LYMPHSABS  --   --  0.5* 0.6* 0.7  MONOABS  --   --  0.3 0.3 0.4  EOSABS  --   --  0.0 0.1 0.1  BASOSABS  --   --  0.0 0.0 0.0    Recent Labs  Lab 12/21/19 1406 12/21/19 1406 12/22/19 0235 12/22/19 0235 12/23/19 0309 12/23/19 1127 12/24/19 0130 12/25/19 0112 12/26/19 0051  NA 137   < > 139  --   --  139 141 140 138  K 5.3*   < > 5.9*   < > 4.7 4.4 4.3  4.3 4.4  CL 109   < > 109  --   --  108 104 105 105  CO2 18*   < > 22  --   --  24 27 27 27   GLUCOSE 225*   < > 170*  --   --  100* 123* 114* 97  BUN 60*   < > 56*  --   --  53* 50* 43* 36*  CREATININE 1.23   < > 1.19  --   --  1.27* 1.11 1.13 1.06  CALCIUM 8.0*   < > 8.0*  --   --  7.7* 7.6* 7.9* 8.1*  AST 30  --  25  --   --   --   --   --   --   ALT 60*  --  55*  --   --   --   --   --   --   ALKPHOS 45  --  39  --   --   --   --   --   --   BILITOT 1.0  --  1.1  --   --   --   --   --   --   ALBUMIN 2.5*  --  2.4*  --   --   --   --   --   --   MG  --   --   --   --   --  2.5* 2.6* 2.4 2.1  TSH  --   --   --   --   --  0.059*  --   --   --   BNP 662.9*  --   --   --   --   --  283.5* 354.2* 355.6*   < > = values in this interval not displayed.    Recent Labs  Lab 12/21/19 1406 12/24/19 0130 12/25/19 0112 12/26/19 0051  BNP 662.9* 283.5* 354.2* 355.6*    ------------------------------------------------------------------------------------------------------------------ No results for input(s): CHOL, HDL, LDLCALC, TRIG, CHOLHDL, LDLDIRECT in the last 72 hours.  Lab Results  Component Value Date   HGBA1C 6.1 (H) 12/14/2019   ------------------------------------------------------------------------------------------------------------------ Recent Labs    12/23/19 1539  T3FREE 1.3*   ------------------------------------------------------------------------------------------------------------------ No results for input(s): VITAMINB12, FOLATE, FERRITIN, TIBC, IRON, RETICCTPCT in the last 72 hours.  Coagulation profile No results for input(s): INR, PROTIME in the last 168 hours.  No results for input(s): DDIMER in the last 72 hours.  Cardiac Enzymes No results for input(s): CKMB, TROPONINI, MYOGLOBIN in the last 168 hours.  Invalid input(s): CK ------------------------------------------------------------------------------------------------------------------    Component  Value Date/Time   BNP 355.6 (H) 12/26/2019 2993    Micro Results No results found for this or any previous visit (from the past 240 hour(s)).  Radiology Reports DG Chest Portable 1 View  Result Date: 12/13/2019 CLINICAL DATA:  Shortness of breath, suspicion of COVID EXAM: PORTABLE CHEST 1 VIEW COMPARISON:  Radiograph 01/07/2019, CT 11/20/2018 FINDINGS: Heterogeneous consolidative and hazy opacity is present in a lower lung and peripheral predominance most coalescent in the left lung periphery. Some more bandlike opacities, possibly subsegmental atelectatic changes or scarring. No pneumothorax. No visible effusion though the costophrenic sulci are partially collimated. Postsurgical changes related to prior CABG including intact and aligned sternotomy wires and multiple surgical clips projecting over the mediastinum. Telemetry leads overlie the chest. The aorta is calcified. The remaining cardiomediastinal contours are unremarkable. Degenerative changes are present in the imaged spine and shoulders. IMPRESSION: 1. Heterogeneous consolidative and hazy opacity in a lower lung and peripheral predominance most coalescent in the left lung periphery, compatible with a multifocal pneumonia including atypical viral etiology in the appropriate clinical setting. 2. More bandlike opacities likely reflect subsegmental atelectatic changes or scarring. Electronically Signed   By: Lovena Le  M.D.   On: 12/13/2019 22:28   ECHOCARDIOGRAM COMPLETE  Result Date: 12/24/2019    ECHOCARDIOGRAM REPORT   Patient Name:   MONTAE STAGER Date of Exam: 12/24/2019 Medical Rec #:  [id number]          Height:       74.0 in Accession #:    [id number]         Weight:       247.6 lb Date of Birth:  1955/07/31          BSA:          2.380 m Patient Age:    15 years           BP:           102/54 mmHg Patient Gender: M                  HR:           61 bpm. Exam Location:  Inpatient Procedure: 2D Echo, Cardiac Doppler and Color  Doppler Indications:    W26.37 Chronic systolic (congestive) heart failure  History:        Patient has prior history of Echocardiogram examinations, most                 recent 04/18/2019. COVID-19 Positive.  Sonographer:    Jonelle Sidle Dance Referring Phys: [id number] Carlene Coria  Sonographer Comments: No subcostal window. IMPRESSIONS  1. Left ventricular ejection fraction, by estimation, is 45 to 50%. The left ventricle has mildly decreased function. The left ventricle demonstrates global hypokinesis. The left ventricular internal cavity size was mildly dilated. There is mild left ventricular hypertrophy. Left ventricular diastolic parameters are indeterminate.  2. Right ventricular systolic function is mildly reduced. The right ventricular size is normal.  3. Left atrial size was moderately dilated.  4. The mitral valve is grossly normal. Trivial mitral valve regurgitation. No evidence of mitral stenosis.  5. The aortic valve is grossly normal. Aortic valve regurgitation is trivial. No aortic stenosis is present. Comparison(s): Prior images reviewed side by side. Changes from prior study are noted. Conclusion(s)/Recommendation(s): No hemodynamically significant valvular heart disease. LV function appears improved when directly compared to prior. FINDINGS  Left Ventricle: Left ventricular ejection fraction, by estimation, is 45 to 50%. The left ventricle has mildly decreased function. The left ventricle demonstrates global hypokinesis. The left ventricular internal cavity size was mildly dilated. There is  mild left ventricular hypertrophy. Left ventricular diastolic parameters are indeterminate. Right Ventricle: The right ventricular size is normal. Right vetricular wall thickness was not well visualized. Right ventricular systolic function is mildly reduced. Left Atrium: Left atrial size was moderately dilated. Right Atrium: Right atrial size was normal in size. Pericardium: There is no evidence of pericardial  effusion. Mitral Valve: The mitral valve is grossly normal. Trivial mitral valve regurgitation. No evidence of mitral valve stenosis. Tricuspid Valve: The tricuspid valve is normal in structure. Tricuspid valve regurgitation is trivial. Aortic Valve: The aortic valve is grossly normal. Aortic valve regurgitation is trivial. Aortic regurgitation PHT measures 521 msec. No aortic stenosis is present. Pulmonic Valve: The pulmonic valve was not well visualized. Pulmonic valve regurgitation is mild. No evidence of pulmonic stenosis. Aorta: The aortic root and ascending aorta are structurally normal, with no evidence of dilitation and the aortic arch was not well visualized. Venous: The inferior vena cava was not well visualized. IAS/Shunts: The atrial septum is grossly normal.  LEFT VENTRICLE PLAX 2D LVIDd:  5.65 cm  Diastology LVIDs:         4.13 cm  LV e' medial:    6.09 cm/s LV PW:         1.40 cm  LV E/e' medial:  11.8 LV IVS:        1.12 cm  LV e' lateral:   7.51 cm/s LVOT diam:     2.10 cm  LV E/e' lateral: 9.6 LV SV:         74 LV SV Index:   31 LVOT Area:     3.46 cm  RIGHT VENTRICLE RV Basal diam:  2.96 cm RV S prime:     7.40 cm/s TAPSE (M-mode): 1.4 cm LEFT ATRIUM              Index       RIGHT ATRIUM           Index LA diam:        5.50 cm  2.31 cm/m  RA Area:     13.80 cm LA Vol (A2C):   114.0 ml 47.90 ml/m RA Volume:   28.40 ml  11.93 ml/m LA Vol (A4C):   87.6 ml  36.81 ml/m LA Biplane Vol: 101.0 ml 42.44 ml/m  AORTIC VALVE LVOT Vmax:   85.00 cm/s LVOT Vmean:  60.800 cm/s LVOT VTI:    0.213 m AI PHT:      521 msec  AORTA Ao Root diam: 3.50 cm Ao Asc diam:  3.70 cm MITRAL VALVE MV Area (PHT): 2.54 cm    SHUNTS MV Decel Time: 299 msec    Systemic VTI:  0.21 m MV E velocity: 71.80 cm/s  Systemic Diam: 2.10 cm MV A velocity: 81.50 cm/s MV E/A ratio:  0.88 Buford Dresser MD Electronically signed by Buford Dresser MD Signature Date/Time: 12/24/2019/12:01:13 PM    Final    VAS [place]  LOWER EXTREMITY VENOUS (DVT)  Result Date: 12/15/2019  Lower Venous DVT Study Indications: Elevated ddimer.  Comparison Study: 07/09/18 previous Performing Technologist: Abram Sander RVS  Examination Guidelines: A complete evaluation includes B-mode imaging, spectral Doppler, color Doppler, and power Doppler as needed of all accessible portions of each vessel. Bilateral testing is considered an integral part of a complete examination. Limited examinations for reoccurring indications may be performed as noted. The reflux portion of the exam is performed with the patient in reverse Trendelenburg.  +---------+---------------+---------+-----------+----------+--------------+ RIGHT    CompressibilityPhasicitySpontaneityPropertiesThrombus Aging +---------+---------------+---------+-----------+----------+--------------+ CFV      Full           Yes      Yes                                 +---------+---------------+---------+-----------+----------+--------------+ SFJ      Full                                                        +---------+---------------+---------+-----------+----------+--------------+ FV Prox  Full                                                        +---------+---------------+---------+-----------+----------+--------------+ FV  Mid   Full                                                        +---------+---------------+---------+-----------+----------+--------------+ FV DistalFull                                                        +---------+---------------+---------+-----------+----------+--------------+ PFV      Full                                                        +---------+---------------+---------+-----------+----------+--------------+ POP      Full           Yes      Yes                                 +---------+---------------+---------+-----------+----------+--------------+ PTV      Full                                                         +---------+---------------+---------+-----------+----------+--------------+ PERO     Full                                                        +---------+---------------+---------+-----------+----------+--------------+   +---------+---------------+---------+-----------+----------+--------------+ LEFT     CompressibilityPhasicitySpontaneityPropertiesThrombus Aging +---------+---------------+---------+-----------+----------+--------------+ CFV      Full           Yes      Yes                                 +---------+---------------+---------+-----------+----------+--------------+ SFJ      Full                                                        +---------+---------------+---------+-----------+----------+--------------+ FV Prox  Full                                                        +---------+---------------+---------+-----------+----------+--------------+ FV Mid   Full                                                        +---------+---------------+---------+-----------+----------+--------------+  FV DistalFull                                                        +---------+---------------+---------+-----------+----------+--------------+ PFV      Full                                                        +---------+---------------+---------+-----------+----------+--------------+ POP      Full           Yes      Yes                                 +---------+---------------+---------+-----------+----------+--------------+ PTV      Full                                                        +---------+---------------+---------+-----------+----------+--------------+ PERO     Full                                                        +---------+---------------+---------+-----------+----------+--------------+     Summary: BILATERAL: - No evidence of deep vein thrombosis seen in the lower extremities,  bilaterally. - No evidence of superficial venous thrombosis in the lower extremities, bilaterally. -No evidence of popliteal cyst, bilaterally. RIGHT: - Findings appear essentially unchanged compared to previous examination.   *See table(s) above for measurements and observations. Electronically signed by Deitra Mayo MD on 12/15/2019 at 6:22:41 PM.    Final     Phillips Climes M.D on 12/26/2019 at 3:07 PM  To page go to [email or web address]

## 2019-12-26 NOTE — Progress Notes (Signed)
Physical Therapy Treatment Patient Details Name: Frank Love MRN: [id number] DOB: 1955-05-08 Today's Date: 12/26/2019    History of Present Illness 64 y.o. male with medical history significant for chronic systolic CHF, ischemic cardiomyopathy with LVEF 35 to 40%, coronary artery disease status post CABG, penile cancer, carotid artery stenosis, who presented to [facility] via EMS after being found severely hypoxic at home. In the ED, he required nonrebreather and high flow nasal cannula to maintain O2 saturation greater than 92%.  Work-up in the ED revealed severe acute hypoxic respiratory failure secondary to COVID-19 viral pneumonia. Unvaccinated. Admitted 12/13/19    PT Comments    Pt continues to be limited in safe mobility by orthostatic hypotension and poor awareness of the deficits it presents to his safety. Pt is currently supervision for bed mobility and min guard for transfers due to lightheadedness he experiences with standing. See OT note for BP measurements. D/c plans remain appropriate if hypotension can be controled PT will continue to follow pt acutely.     Follow Up Recommendations  Home health PT;Supervision for mobility/OOB (if orthostasis persists, may need inpatient rehab/SNF )     Equipment Recommendations  Rolling walker with 5" wheels;3in1 (PT)       Precautions / Restrictions Precautions Precautions: Other (comment) Precaution Comments: orthostasis Restrictions Weight Bearing Restrictions: No    Mobility  Bed Mobility Overal bed mobility: Needs Assistance Bed Mobility: Supine to Sit;Sit to Supine     Supine to sit: Supervision Sit to supine: Supervision      Transfers Overall transfer level: Needs assistance Equipment used: Rolling walker (2 wheeled) Transfers: Sit to/from Stand Sit to Stand: Min guard         General transfer comment: close guarding for safety due to dizziness        Balance Overall balance assessment: Needs assistance    Sitting balance-Leahy Scale: Good     Standing balance support: Bilateral upper extremity supported Standing balance-Leahy Scale: Poor Standing balance comment: reliant on BUE support                            Cognition Arousal/Alertness: Awake/alert Behavior During Therapy: Flat affect Overall Cognitive Status: Impaired/Different from baseline Area of Impairment: Awareness;Safety/judgement;Problem solving                         Safety/Judgement: Decreased awareness of safety Awareness: Emergent Problem Solving: Slow processing;Difficulty sequencing;Requires verbal cues;Requires tactile cues General Comments: pt presenting with poor awareness of current deficits, but is overall agreeable and redirectable. He will state how he does not understand why hes has to do something, but then easily is agreeable with what staff says. Poor problem solving noted overall         General Comments General comments (skin integrity, edema, etc.): See orthostatic results in vitals flowsheet      Pertinent Vitals/Pain Pain Assessment: No/denies pain           PT Goals (current goals can now be found in the care plan section) Acute Rehab PT Goals Patient Stated Goal: to go home PT Goal Formulation: With patient Time For Goal Achievement: 12/29/19 Potential to Achieve Goals: Fair Progress towards PT goals: Not progressing toward goals - comment (limited by hypotension )    Frequency    Min 3X/week      PT Plan Current plan remains appropriate (if orthostasis can be managed)  AM-PAC PT "6 Clicks" Mobility   Outcome Measure  Help needed turning from your back to your side while in a flat bed without using bedrails?: A Little Help needed moving from lying on your back to sitting on the side of a flat bed without using bedrails?: A Little Help needed moving to and from a bed to a chair (including a wheelchair)?: A Little Help needed standing up from a  chair using your arms (e.g., wheelchair or bedside chair)?: A Little Help needed to walk in hospital room?: Total Help needed climbing 3-5 steps with a railing? : Total 6 Click Score: 14    End of Session   Activity Tolerance: Treatment limited secondary to medical complications (Comment) (orthostasis) Patient left: with call bell/phone within reach;in bed Nurse Communication: Other (comment) PT Visit Diagnosis: Other abnormalities of gait and mobility (R26.89);Muscle weakness (generalized) (M62.81);Difficulty in walking, not elsewhere classified (R26.2)     Time: 4656-8127 PT Time Calculation (min) (ACUTE ONLY): 29 min  Charges:  $Therapeutic Activity: 8-22 mins                     Marguita Venning B. Migdalia Dk PT, DPT Acute Rehabilitation Services Pager [phone] Office [phone]    [place] 12/26/2019, 3:15 PM

## 2019-12-26 NOTE — Progress Notes (Signed)
Occupational Therapy Treatment Patient Details Name: Frank Love MRN: [id number] DOB: 11-06-1955 Today's Date: 12/26/2019    History of present illness 64 y.o. male with medical history significant for chronic systolic CHF, ischemic cardiomyopathy with LVEF 35 to 40%, coronary artery disease status post CABG, penile cancer, carotid artery stenosis, who presented to [facility] via EMS after being found severely hypoxic at home. In the ED, he required nonrebreather and high flow nasal cannula to maintain O2 saturation greater than 92%.  Work-up in the ED revealed severe acute hypoxic respiratory failure secondary to COVID-19 viral pneumonia. Unvaccinated. Admitted 12/13/19   OT comments  Pt seen for OT follow up session. Has been positive for orthostasis the past few days that has been limiting d/c. Pt completed bed mobility with supervision assist and sit <> stands with min guard for safety. Pt endorses some mild dizziness, although closes his eyes tightly and grimaces. BP is stated below. Further mobility deferred due to continued significant drop in blood pressure. Noted decreased cognitive status with problem solving, awareness, and safety. D/c recs remain appropriate. Will continue to follow and progress as able.    12/26/19 1034  Orthostatic Lying   BP- Lying 108/63  Pulse- Lying (!) 49  Orthostatic Sitting  BP- Sitting (!) 84/52  Pulse- Sitting 60  Orthostatic Standing at 0 minutes  BP- Standing at 0 minutes (!) 70/46  Pulse- Standing at 0 minutes 75  Orthostatic Standing at 3 minutes  BP- Standing at 3 minutes (!) 74/52  Pulse- Standing at 3 minutes 75     Follow Up Recommendations  Home health OT;Supervision/Assistance - 24 hour    Equipment Recommendations  3 in 1 bedside commode    Recommendations for Other Services      Precautions / Restrictions Precautions Precautions: Other (comment) Precaution Comments: orthostasis Restrictions Weight Bearing Restrictions: No        Mobility Bed Mobility Overal bed mobility: Needs Assistance Bed Mobility: Supine to Sit;Sit to Supine     Supine to sit: Supervision Sit to supine: Supervision      Transfers Overall transfer level: Needs assistance Equipment used: Rolling walker (2 wheeled) Transfers: Sit to/from Stand Sit to Stand: Min guard         General transfer comment: close guarding for safety due to dizziness    Balance Overall balance assessment: Needs assistance   Sitting balance-Leahy Scale: Good     Standing balance support: Bilateral upper extremity supported Standing balance-Leahy Scale: Poor Standing balance comment: reliant on BUE support                           ADL either performed or assessed with clinical judgement   ADL Overall ADL's : Needs assistance/impaired                         Toilet Transfer: Min Arts development officer Details (indicate cue type and reason): close guard for safety           General ADL Comments: session mostly focused on managing vitals due to pt being +orthostatics with poor activity tolerance     Vision Baseline Vision/History: No visual deficits Additional Comments: keep eyes closed and states "I always do this" but making grimacing uncomfortable facial expressions   Perception     Praxis      Cognition Arousal/Alertness: Awake/alert Behavior During Therapy: Flat affect Overall Cognitive Status: Impaired/Different from baseline Area of Impairment: Awareness;Safety/judgement;Problem  solving                         Safety/Judgement: Decreased awareness of safety Awareness: Emergent Problem Solving: Slow processing;Difficulty sequencing;Requires verbal cues;Requires tactile cues General Comments: pt presenting with poor awareness of current deficits, but is overall agreeable and redirectable. He will state how he does not understand why hes has to do something, but then easily is  agreeable with what staff says. Poor problem solving noted overall        Exercises     Shoulder Instructions       General Comments      Pertinent Vitals/ Pain       Pain Assessment: No/denies pain  Home Living                                          Prior Functioning/Environment              Frequency  Min 2X/week        Progress Toward Goals  OT Goals(current goals can now be found in the care plan section)  Progress towards OT goals: Progressing toward goals  Acute Rehab OT Goals Patient Stated Goal: to go home OT Goal Formulation: With patient Time For Goal Achievement: 12/30/19 Potential to Achieve Goals: Good  Plan Discharge plan remains appropriate;Frequency remains appropriate    Co-evaluation                 AM-PAC OT "6 Clicks" Daily Activity     Outcome Measure   Help from another person eating meals?: None Help from another person taking care of personal grooming?: None Help from another person toileting, which includes using toliet, bedpan, or urinal?: A Little Help from another person bathing (including washing, rinsing, drying)?: A Little Help from another person to put on and taking off regular upper body clothing?: None Help from another person to put on and taking off regular lower body clothing?: A Little 6 Click Score: 21    End of Session Equipment Utilized During Treatment: Gait belt;Rolling walker  OT Visit Diagnosis: Unsteadiness on feet (R26.81);Other abnormalities of gait and mobility (R26.89);Muscle weakness (generalized) (M62.81);Other symptoms and signs involving cognitive function;Dizziness and giddiness (R42)   Activity Tolerance Patient tolerated treatment well   Patient Left in bed;with call bell/phone within reach   Nurse Communication Mobility status        Time: 3810-1751 OT Time Calculation (min): 27 min  Charges: OT General Charges $OT Visit: 1 Visit OT Treatments $Self  Care/Home Management : 8-22 mins  Zenovia Jarred, MSOT, OTR/L [place] [facility] Office Number: [phone] Pager: [phone]  Zenovia Jarred 12/26/2019, 1:51 PM

## 2019-12-26 NOTE — Progress Notes (Addendum)
Advanced Heart Failure Rounding Note  PCP-Cardiologist: [name], MD   Subjective:    Orthostatic VS lying BP 108/4863, HR 49; sitting 84/52, HR 60; standing 74/52, HR 75.     Frank Love sitting up in chair.  Reports his only complaint today is nausea.  Denies chest pain, shortness of breath, dizziness or diarrhea.    Echo: LVEF 45-50% (improved from prior study, previously 35-40%). RV mildly reduced. No significant valvular disease. No pericardial effusion.     Objective:   Weight Range: 110.9 kg Body mass index is 31.39 kg/m.   Vital Signs:   Temp:  [97.5 F (36.4 C)-98.1 F (36.7 C)] 97.7 F (36.5 C) (12/02 1148) Pulse Rate:  [47-53] 47 (12/02 1148) Resp:  [14-20] 17 (12/02 1148) BP: (100-128)/(57-70) 119/70 (12/02 1148) SpO2:  [94 %-100 %] 98 % (12/02 1148) Weight:  [110.9 kg] 110.9 kg (12/02 0457) Last BM Date: 12/25/19  Weight change: Filed Weights   12/24/19 0423 12/25/19 0521 12/26/19 0457  Weight: 112.3 kg 110.6 kg 110.9 kg    Intake/Output:  No intake or output data in the 24 hours ending 12/26/19 1342    Physical Exam    General:  Fatigue appearing. No resp difficulty HEENT: normal Neck: supple. no JVD.  Cor: PMI nondisplaced. Regular rate & rhythm. No rubs, gallops or murmurs. Lungs: clear bilaterally.  Abdomen: soft, nontender, nondistended.  Extremities: no cyanosis, clubbing, rash.  Compression stockings on with swelling noted to L foot.   Neuro: alert & oriented x 3, moves all 4 extremities w/o difficulty. Affect pleasant   Telemetry   Sinus bradycardia, rates average 40-50s.  Personally reviewed.   EKG    No new EKG to review   Labs    CBC Recent Labs    12/25/19 0112 12/26/19 0051  WBC 5.3 5.6  NEUTROABS 4.2 4.3  HGB 10.6* 11.1*  HCT 31.7* 33.2*  MCV 86.4 86.2  PLT 105* 734*   Basic Metabolic Panel Recent Labs    12/25/19 0112 12/26/19 0051  NA 140 138  K 4.3 4.4  CL 105 105  CO2 27 27  GLUCOSE 114*  97  BUN 43* 36*  CREATININE 1.13 1.06  CALCIUM 7.9* 8.1*  MG 2.4 2.1   Liver Function Tests No results for input(s): AST, ALT, ALKPHOS, BILITOT, PROT, ALBUMIN in the last 72 hours. No results for input(s): LIPASE, AMYLASE in the last 72 hours. Cardiac Enzymes No results for input(s): CKTOTAL, CKMB, CKMBINDEX, TROPONINI in the last 72 hours.  BNP: BNP (last 3 results) Recent Labs    12/24/19 0130 12/25/19 0112 12/26/19 0051  BNP 283.5* 354.2* 355.6*    ProBNP (last 3 results) No results for input(s): PROBNP in the last 8760 hours.   D-Dimer No results for input(s): DDIMER in the last 72 hours. Hemoglobin A1C No results for input(s): HGBA1C in the last 72 hours. Fasting Lipid Panel No results for input(s): CHOL, HDL, LDLCALC, TRIG, CHOLHDL, LDLDIRECT in the last 72 hours. Thyroid Function Tests Recent Labs    12/23/19 1539  T3FREE 1.3*    Other results:   Imaging    . No results found.   Medications:     Scheduled Medications: . . . vitamin C .  500 mg . Oral . Daily  . . . aspirin EC .  81 mg . Oral . QHS  . Marland Kitchen Marland Kitchen atorvastatin .  40 mg . Oral . q1800  . . . bisacodyl .  10  mg . Oral . Daily  . . . cholecalciferol .  1,000 Units . Oral . Daily  . . . enoxaparin (LOVENOX) injection .  55 mg . Subcutaneous . Q24H  . Marland Kitchen . feeding supplement ([place]) .  237 mL . Oral . TID BM  . . . folic acid .  1 mg . Oral . Daily  . . . insulin aspart .  0-9 Units . Subcutaneous . TID WC  . Marland Kitchen . insulin glargine .  6 Units . Subcutaneous . Daily  . . . Ipratropium-Albuterol .  1 puff . Inhalation . BID  . Marland Kitchen . melatonin .  3 mg . Oral . QHS  . Marland Kitchen . midodrine .  10 mg . Oral . TID WC  . Marland Kitchen . multivitamin with minerals .  1 tablet . Oral . Daily  . . . pantoprazole .  40 mg . Oral . Daily  . . . polyethylene glycol .  17 g . Oral . Daily  . . . thiamine .  100 mg . Oral . Daily  . . . zinc sulfate .  220 mg . Oral . Daily  .   Infusions: .    PRN  Medications: . acetaminophen, nitroGLYCERIN, ondansetron (ZOFRAN) IV, sodium chloride    Assessment/Plan   1.  Acute respiratory failure 2/2 COVID-19 Viral PNA -Per primary team -Received remdesivir x 5 days, IV steroids (tapering down: initially on 125 TID now on 40 mg daily), tocilizumab/baricitinib.  -Off continuous Mun[place]  - Improving   2. Hypotension (hx of hypertension) - Received IVF bolus 11/29.   -Continue to hold home farxiga, dig, lasix, aldactone, entresto and coreg -Had similar episode post-CABG 11/2018 required NE and dopamine then transitioned to midodrine 5 mg TID.  - c/w TED and add abdominal binder - c/w midodrine 10 mg TID  - will give 1L of LR over 10 hours due to orthostatic VS  3. 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 CAD: S/p CABG in 11/20. He graduated cardiac rehab 6/21  - Lipid panel good 7/21 - Continue ASA 81 daily.    5. Chronic systolic CHF 2/2 Ischemic cardiomyopathy.   - Pre-CABG echo with EF 30-35%, mild RV dysfunction.  Post-CABG echo with EF 30-35%, severe RV systolic dysfunction.  V/Q scan done, no evidence for PE. Hypotensive post-CABG requiring pressors and then midodrine, but improved over time.  -Echo repeated 3/21, EF 35-40% with moderate RV dysfunction, GIIDD. Cardiac MRI was recommended to quantify EF more exactly for purposes of ICD determination, however pt was adamant that he did not want an ICD and declined cMRI.  - Echo repeated this admit. LVEF improved to 45-50%. RV mildly reduced  - NYHA Class II - Holding home Coreg 6.25 mg bid dose d/t BP and heart rate; Entresto to 97-103 bid d/t Bps; spironolactone 25 mg daily d/t Bps; digoxin 0.125 d/t Bps and bradycardia; Farxiga 5 mg daily d/t Bps.    - Once stable, will need to resume GMDT and follow up outpatient.    6. Carotid stenosis:  - dopplers 11/20 showed 40-59% Rt and 1-39% Lt ICA stenosis   - Repeat carotid dopplers study  scheduled 01/14/2020.  - continue ASA and statin.    7. Acute on CKD -admission Cr 1.32, peak 1.51 on 11/22 - Baseline Cr around 1 -Today Cr 1.06 -Continue to monitor BMP   8. Hyperkalemia - resolved  Length of Stay: [place], NP  12/26/2019, 1:42 PM  Advanced Heart Failure Team Pager [phone] (M-F; 7a - 4p)  Please contact [facility] for night-coverage after hours (4p -7a ) and weekends on [email or web address]  Agree with above note.   Still orthostatic and symptomatic.  Orthostatic hypotension could be due to autonomic neuropathy as a consequence of COVID-19.  EF higher than in the past on echo, 45-50%.   - Agree with 1 L IVF today (watch for development of volume overload).  - Midodrine to 10 mg tid.   - Graded compression stockings.  - Can add abdominal binder.   [name] 12/26/2019 .

## 2019-12-27 LAB — CBC WITH DIFFERENTIAL/PLATELET
Abs Immature Granulocytes: 0.08 10*3/uL — ABNORMAL HIGH (ref 0.00–0.07)
Basophils Absolute: 0 10*3/uL (ref 0.0–0.1)
Basophils Relative: 0 %
Eosinophils Absolute: 0.2 10*3/uL (ref 0.0–0.5)
Eosinophils Relative: 4 %
HCT: 35.7 % — ABNORMAL LOW (ref 39.0–52.0)
Hemoglobin: 11.5 g/dL — ABNORMAL LOW (ref 13.0–17.0)
Immature Granulocytes: 2 %
Lymphocytes Relative: 19 %
Lymphs Abs: 0.8 10*3/uL (ref 0.7–4.0)
MCH: 28.3 pg (ref 26.0–34.0)
MCHC: 32.2 g/dL (ref 30.0–36.0)
MCV: 87.9 fL (ref 80.0–100.0)
Monocytes Absolute: 0.3 10*3/uL (ref 0.1–1.0)
Monocytes Relative: 7 %
Neutro Abs: 2.9 10*3/uL (ref 1.7–7.7)
Neutrophils Relative %: 68 %
Platelets: 92 10*3/uL — ABNORMAL LOW (ref 150–400)
RBC: 4.06 MIL/uL — ABNORMAL LOW (ref 4.22–5.81)
RDW: 15.6 % — ABNORMAL HIGH (ref 11.5–15.5)
WBC: 4.3 10*3/uL (ref 4.0–10.5)
nRBC: 0 % (ref 0.0–0.2)

## 2019-12-27 LAB — GLUCOSE, CAPILLARY
Glucose-Capillary: 86 mg/dL (ref 70–99)
Glucose-Capillary: 98 mg/dL (ref 70–99)
Glucose-Capillary: 116 mg/dL — ABNORMAL HIGH (ref 70–99)
Glucose-Capillary: 59 mg/dL — ABNORMAL LOW (ref 70–99)
Glucose-Capillary: 114 mg/dL — ABNORMAL HIGH (ref 70–99)

## 2019-12-27 LAB — BASIC METABOLIC PANEL
Anion gap: 9 (ref 5–15)
BUN: 28 mg/dL — ABNORMAL HIGH (ref 8–23)
CO2: 28 mmol/L (ref 22–32)
Calcium: 8.3 mg/dL — ABNORMAL LOW (ref 8.9–10.3)
Chloride: 103 mmol/L (ref 98–111)
Creatinine, Ser: 1.18 mg/dL (ref 0.61–1.24)
GFR, Estimated: >60 mL/min (ref >60)
Glucose, Bld: 89 mg/dL (ref 70–99)
Potassium: 4.2 mmol/L (ref 3.5–5.1)
Sodium: 140 mmol/L (ref 135–145)

## 2019-12-27 LAB — MAGNESIUM: Magnesium: 1.9 mg/dL (ref 1.7–2.4)

## 2019-12-27 LAB — BRAIN NATRIURETIC PEPTIDE: B Natriuretic Peptide: 440.7 pg/mL — ABNORMAL HIGH (ref 0.0–100.0)

## 2019-12-27 MED ORDER — MIDODRINE HCL 5 MG PO TABS
15.0000 mg | ORAL_TABLET | Freq: Three times a day (TID) | ORAL | Status: DC
  Administered 2019-12-27 – 2020-01-03 (×22): 15 mg via ORAL
  Filled 2019-12-27 (×22): qty 3

## 2019-12-27 NOTE — Progress Notes (Addendum)
PROGRESS NOTE                                                                                                                                                                                                             Patient Demographics:    Frank Love, is a 64 y.o. male, DOB - May 19, 1955, EHU:[id number]  Admit date - 12/13/2019   Admitting Physician Kayleen Memos, DO  Outpatient Primary MD for the patient is [name], NP  LOS - 14  Chief Complaint  Patient presents with  . Covid Exposure  . Shortness of Breath  . generalized weakness       Brief Narrative (HPI from H&P)  - 64 y.o.malewith medical history significant forchronic systolic CHF, ischemic cardiomyopathy with LVEF 35 to 40%, coronary artery disease status post CABG, penile cancer, carotid artery stenosis, who presented to [facility] via EMS after being found severely hypoxicathome.Unfortunately patient is unvaccinated against Covid, his work-up was significant for COVID-19 of pneumonia and severe hypoxia for which he is admitted by [facility].   Subjective:   Patient denies any complaints this morning, he remains orthostatic, and symptomatic as well.     Assessment  & Plan :     Acute hypoxic respiratory failure due to COVID-19 pneumonia.   - He is unvaccinated and incurred severe parenchymal lung injur. -Initially requiring 30 L heated high flow nasal cannula, this has significantly improved, he is currently on room air. -He was treated with steroids, Remdesivir and Actemra. -He was encouraged use incentive spirometry and flutter valve.    Recent Labs  Lab 12/21/19 1406 12/21/19 1406 12/22/19 0235 12/24/19 0130 12/25/19 0112 12/26/19 0051 12/27/19 0310  WBC 7.3   < > 6.6 4.6 5.3 5.6 4.3  HGB 11.6*   < > 11.0* 10.4* 10.6* 11.1* 11.5*  HCT 36.7*   < > 33.8* 32.1* 31.7* 33.2* 35.7*  PLT 190   < > 174 124* 105* 103* 92*  BNP 662.9*  --   --  283.5* 354.2* 355.6* 440.7*  AST 30  --  25   --   --   --   --   ALT 60*  --  55*  --   --   --   --   ALKPHOS 45  --  39  --   --   --   --   BILITOT 1.0  --  1.1  --   --   --   --   ALBUMIN  2.5*  --  2.4*  --   --   --   --    < > = values in this interval not displayed.     Chronic combined systolic and diastolic heart failure -  recent echocardiogram shows EF of 35%.  Repeat 2D echo during hospital stay showing improved EF 45%.   -Management by CHF team, cardiology . -Home medication including Coreg, Wilder Glade, Entresto and digoxin on hold given low blood pressure and bradycardia .  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 -He remains orthostatic this morning and symptomatic as well despite being on midodrine 10 mg oral 3 times daily, on TED hose and abdominal binder, and receiving fluid bolus yesterday, and holding all of his diuresis and antihypertensive regimen . -Midodrine was increased to 15 mg 3 times daily by CHF team .  Dyslipidemia.  On statin.  Thrombocytopenia -Continue to monitor closely, on Lovenox for DVT prophylaxis  CAD s/p CABG.  On aspirin and statin combination, low-dose beta-blocker if tolerated by blood pressure and heart rate.  Hyperkalemia.  Treated will monitor.  Mildly suppressed TSH.  Free T4 slightly high.  Since he is bradycardic and hypotensive I do not think he is a exhibiting any signs of hyperthyroidism, will request to repeat TSH, T3 and free T4 in 4 weeks by PCP.     Condition - Extremely Guarded  Family Communication  : Discussed with patient in details, answered all his questions.  Daughter updated by phone 12/2  Code Status :  Full  Consults  :  Cards  Procedures  :    TTE  Leg [place] - No DVT  PUD Prophylaxis : PPI  Disposition Plan  :    Status is: Inpatient  Remains inpatient appropriate because:IV treatments appropriate due to intensity of illness or inability to  take PO   Dispo:  Patient From: Home  Planned Disposition: Home  Expected discharge date: 12/29/19  Medically stable for discharge: No   DVT Prophylaxis  :  Lovenox   Lab Results  Component Value Date   PLT 92 (L) 12/27/2019    Diet :  Diet Order            Diet regular Room service appropriate? Yes; Fluid consistency: Thin  Diet effective now                  Inpatient Medications Scheduled Meds: . vitamin C  500 mg Oral Daily  . aspirin EC  81 mg Oral QHS  . atorvastatin  40 mg Oral q1800  . bisacodyl  10 mg Oral Daily  . cholecalciferol  1,000 Units Oral Daily  . enoxaparin (LOVENOX) injection  55 mg Subcutaneous Q24H  . feeding supplement (GLUCERNA SHAKE)  237 mL Oral TID BM  . folic acid  1 mg Oral Daily  . insulin aspart  0-9 Units Subcutaneous TID WC  . insulin glargine  6 Units Subcutaneous Daily  . Ipratropium-Albuterol  1 puff Inhalation BID  . melatonin  3 mg Oral QHS  . midodrine  15 mg Oral TID WC  . multivitamin with minerals  1 tablet Oral Daily  . pantoprazole  40 mg Oral Daily  . polyethylene glycol  17 g Oral Daily  . thiamine  100 mg Oral Daily  . zinc sulfate  220 mg Oral Daily   Continuous Infusions:  PRN Meds:.acetaminophen, nitroGLYCERIN, ondansetron (ZOFRAN) IV, sodium chloride  Antibiotics  :   Anti-infectives (From admission, onward)   Start     Dose/Rate Route Frequency Ordered Stop   12/15/19 1000  remdesivir 100 mg in sodium chloride 0.9 % 100 mL IVPB       "Followed by" Linked Group Details   100 mg 200 mL/hr over 30 Minutes Intravenous Daily 12/14/19 0002 12/18/19 0849   12/14/19 1000  remdesivir 100 mg in sodium chloride 0.9 % 100 mL IVPB  Status:  Discontinued       "Followed by" Linked Group Details   100 mg 200 mL/hr over 30 Minutes Intravenous Daily 12/13/19 2358 12/14/19 0001   12/14/19 0030  remdesivir 100 mg in sodium chloride 0.9 % 100 mL IVPB       "Followed by" Linked Group Details   100 mg 200 mL/hr over 30  Minutes Intravenous Every 30 min 12/14/19 0002 12/14/19 0210   12/14/19 0000  remdesivir 200 mg in sodium chloride 0.9% 250 mL IVPB  Status:  Discontinued       "Followed by" Linked Group Details   200 mg 580 mL/hr over 30 Minutes Intravenous Once 12/13/19 2358 12/14/19 0001   12/13/19 2245  vancomycin (VANCOREADY) IVPB 2000 mg/400 mL        2,000 mg 200 mL/hr over 120 Minutes Intravenous  Once 12/13/19 2241 12/14/19 0210   12/13/19 2245  ceFEPIme (MAXIPIME) 2 g in sodium chloride 0.9 % 100 mL IVPB        2 g 200 mL/hr over 30 Minutes Intravenous  Once 12/13/19 2241 12/14/19 0002          Objective:   Vitals:   12/26/19 2335 12/27/19 0445 12/27/19 0800 12/27/19 0838  BP: 103/62 117/67 99/62 (!) 89/47  Pulse: (!) 51 (!) 50 (!) 51 60  Resp: 15 18 16 18   Temp: 97.7 F (36.5 C) 97.6 F (36.4 C) 97.9 F (36.6 C) (!) 97.4 F (36.3 C)  TempSrc: Oral Oral Oral Oral  SpO2: 90% 96% 94% 94%  Weight:  110.9 kg    Height:        SpO2: 94 % O2 Flow Rate (L/min): 1 L/min FiO2 (%): 36 %  Wt Readings from Last 3 Encounters:  12/27/19 110.9 kg  09/10/19 114.8 kg  08/13/19 114.8 kg     Intake/Output Summary (Last 24 hours) at 12/27/2019 1127 Last data filed at 12/27/2019 0900 Gross per 24 hour  Intake 500.49 ml  Output --  Net 500.49 ml     Physical Exam  Awake Alert, Oriented X 3, No new F.N deficits, Normal affect Symmetrical Chest wall movement, Good air movement bilaterally, CTAB RRR,No Gallops,Rubs or new Murmurs, No Parasternal Heave +ve B.Sounds, Abd Soft, No tenderness, No rebound - guarding or rigidity. No Cyanosis, Clubbing or edema, he is wearing abdominal binder and TED hose      Data Review:   Recent Labs  Lab 12/22/19 0235 12/24/19 0130 12/25/19 0112 12/26/19 0051 12/27/19 0310  WBC 6.6 4.6 5.3 5.6 4.3  HGB 11.0* 10.4* 10.6* 11.1* 11.5*  HCT 33.8* 32.1* 31.7* 33.2* 35.7*  PLT 174 124* 105* 103* 92*  MCV 86.2 87.0 86.4 86.2 87.9  MCH 28.1 28.2  28.9 28.8 28.3  MCHC 32.5 32.4 33.4 33.4 32.2  RDW 14.7 14.8 15.1 15.2 15.6*  LYMPHSABS  --  0.5* 0.6* 0.7 0.8  MONOABS  --  0.3 0.3 0.4 0.3  EOSABS  --  0.0 0.1 0.1 0.2  BASOSABS  --  0.0 0.0  0.0 0.0    Recent Labs  Lab 12/21/19 1406 12/21/19 1406 12/22/19 0235 12/23/19 0309 12/23/19 1127 12/24/19 0130 12/25/19 0112 12/26/19 0051 12/27/19 0310  NA 137   < > 139  --  139 141 140 138 140  K 5.3*   < > 5.9*   < > 4.4 4.3 4.3 4.4 4.2  CL 109   < > 109  --  108 104 105 105 103  CO2 18*   < > 22  --  24 27 27 27 28   GLUCOSE 225*   < > 170*  --  100* 123* 114* 97 89  BUN 60*   < > 56*  --  53* 50* 43* 36* 28*  CREATININE 1.23   < > 1.19  --  1.27* 1.11 1.13 1.06 1.18  CALCIUM 8.0*   < > 8.0*  --  7.7* 7.6* 7.9* 8.1* 8.3*  AST 30  --  25  --   --   --   --   --   --   ALT 60*  --  55*  --   --   --   --   --   --   ALKPHOS 45  --  39  --   --   --   --   --   --   BILITOT 1.0  --  1.1  --   --   --   --   --   --   ALBUMIN 2.5*  --  2.4*  --   --   --   --   --   --   MG  --   --   --   --  2.5* 2.6* 2.4 2.1 1.9  TSH  --   --   --   --  0.059*  --   --   --   --   BNP 662.9*  --   --   --   --  283.5* 354.2* 355.6* 440.7*   < > = values in this interval not displayed.    Recent Labs  Lab 12/21/19 1406 12/24/19 0130 12/25/19 0112 12/26/19 0051 12/27/19 0310  BNP 662.9* 283.5* 354.2* 355.6* 440.7*    ------------------------------------------------------------------------------------------------------------------ No results for input(s): CHOL, HDL, LDLCALC, TRIG, CHOLHDL, LDLDIRECT in the last 72 hours.  Lab Results  Component Value Date   HGBA1C 6.1 (H) 12/14/2019   ------------------------------------------------------------------------------------------------------------------ No results for input(s): TSH, T4TOTAL, T3FREE, THYROIDAB in the last 72 hours.  Invalid input(s):  FREET3 ------------------------------------------------------------------------------------------------------------------ No results for input(s): VITAMINB12, FOLATE, FERRITIN, TIBC, IRON, RETICCTPCT in the last 72 hours.  Coagulation profile No results for input(s): INR, PROTIME in the last 168 hours.  No results for input(s): DDIMER in the last 72 hours.  Cardiac Enzymes No results for input(s): CKMB, TROPONINI, MYOGLOBIN in the last 168 hours.  Invalid input(s): CK ------------------------------------------------------------------------------------------------------------------    Component Value Date/Time   BNP 440.7 (H) 12/27/2019 0310    Micro Results No results found for this or any previous visit (from the past 240 hour(s)).  Radiology Reports DG Chest Portable 1 View  Result Date: 12/13/2019 CLINICAL DATA:  Shortness of breath, suspicion of COVID EXAM: PORTABLE CHEST 1 VIEW COMPARISON:  Radiograph 01/07/2019, CT 11/20/2018 FINDINGS: Heterogeneous consolidative and hazy opacity is present in a lower lung and peripheral predominance most coalescent in the left lung periphery. Some more bandlike opacities, possibly subsegmental atelectatic changes or scarring. No pneumothorax. No visible effusion though the costophrenic sulci  are partially collimated. Postsurgical changes related to prior CABG including intact and aligned sternotomy wires and multiple surgical clips projecting over the mediastinum. Telemetry leads overlie the chest. The aorta is calcified. The remaining cardiomediastinal contours are unremarkable. Degenerative changes are present in the imaged spine and shoulders. IMPRESSION: 1. Heterogeneous consolidative and hazy opacity in a lower lung and peripheral predominance most coalescent in the left lung periphery, compatible with a multifocal pneumonia including atypical viral etiology in the appropriate clinical setting. 2. More bandlike opacities likely reflect  subsegmental atelectatic changes or scarring. Electronically Signed   By: Lovena Le M.D.   On: 12/13/2019 22:28   ECHOCARDIOGRAM COMPLETE  Result Date: 12/24/2019    ECHOCARDIOGRAM REPORT   Patient Name:   Frank Love Date of Exam: 12/24/2019 Medical Rec #:  [id number]          Height:       74.0 in Accession #:    [id number]         Weight:       247.6 lb Date of Birth:  05-Dec-1955          BSA:          2.380 m Patient Age:    40 years           BP:           102/54 mmHg Patient Gender: M                  HR:           61 bpm. Exam Location:  Inpatient Procedure: 2D Echo, Cardiac Doppler and Color Doppler Indications:    X32.44 Chronic systolic (congestive) heart failure  History:        Patient has prior history of Echocardiogram examinations, most                 recent 04/18/2019. COVID-19 Positive.  Sonographer:    Jonelle Sidle Dance Referring Phys: [id number] Carlene Coria  Sonographer Comments: No subcostal window. IMPRESSIONS  1. Left ventricular ejection fraction, by estimation, is 45 to 50%. The left ventricle has mildly decreased function. The left ventricle demonstrates global hypokinesis. The left ventricular internal cavity size was mildly dilated. There is mild left ventricular hypertrophy. Left ventricular diastolic parameters are indeterminate.  2. Right ventricular systolic function is mildly reduced. The right ventricular size is normal.  3. Left atrial size was moderately dilated.  4. The mitral valve is grossly normal. Trivial mitral valve regurgitation. No evidence of mitral stenosis.  5. The aortic valve is grossly normal. Aortic valve regurgitation is trivial. No aortic stenosis is present. Comparison(s): Prior images reviewed side by side. Changes from prior study are noted. Conclusion(s)/Recommendation(s): No hemodynamically significant valvular heart disease. LV function appears improved when directly compared to prior. FINDINGS  Left Ventricle: Left ventricular ejection fraction,  by estimation, is 45 to 50%. The left ventricle has mildly decreased function. The left ventricle demonstrates global hypokinesis. The left ventricular internal cavity size was mildly dilated. There is  mild left ventricular hypertrophy. Left ventricular diastolic parameters are indeterminate. Right Ventricle: The right ventricular size is normal. Right vetricular wall thickness was not well visualized. Right ventricular systolic function is mildly reduced. Left Atrium: Left atrial size was moderately dilated. Right Atrium: Right atrial size was normal in size. Pericardium: There is no evidence of pericardial effusion. Mitral Valve: The mitral valve is grossly normal. Trivial mitral valve regurgitation. No evidence of mitral valve stenosis. Tricuspid Valve:  The tricuspid valve is normal in structure. Tricuspid valve regurgitation is trivial. Aortic Valve: The aortic valve is grossly normal. Aortic valve regurgitation is trivial. Aortic regurgitation PHT measures 521 msec. No aortic stenosis is present. Pulmonic Valve: The pulmonic valve was not well visualized. Pulmonic valve regurgitation is mild. No evidence of pulmonic stenosis. Aorta: The aortic root and ascending aorta are structurally normal, with no evidence of dilitation and the aortic arch was not well visualized. Venous: The inferior vena cava was not well visualized. IAS/Shunts: The atrial septum is grossly normal.  LEFT VENTRICLE PLAX 2D LVIDd:         5.65 cm  Diastology LVIDs:         4.13 cm  LV e' medial:    6.09 cm/s LV PW:         1.40 cm  LV E/e' medial:  11.8 LV IVS:        1.12 cm  LV e' lateral:   7.51 cm/s LVOT diam:     2.10 cm  LV E/e' lateral: 9.6 LV SV:         74 LV SV Index:   31 LVOT Area:     3.46 cm  RIGHT VENTRICLE RV Basal diam:  2.96 cm RV S prime:     7.40 cm/s TAPSE (M-mode): 1.4 cm LEFT ATRIUM              Index       RIGHT ATRIUM           Index LA diam:        5.50 cm  2.31 cm/m  RA Area:     13.80 cm LA Vol (A2C):   114.0  ml 47.90 ml/m RA Volume:   28.40 ml  11.93 ml/m LA Vol (A4C):   87.6 ml  36.81 ml/m LA Biplane Vol: 101.0 ml 42.44 ml/m  AORTIC VALVE LVOT Vmax:   85.00 cm/s LVOT Vmean:  60.800 cm/s LVOT VTI:    0.213 m AI PHT:      521 msec  AORTA Ao Root diam: 3.50 cm Ao Asc diam:  3.70 cm MITRAL VALVE MV Area (PHT): 2.54 cm    SHUNTS MV Decel Time: 299 msec    Systemic VTI:  0.21 m MV E velocity: 71.80 cm/s  Systemic Diam: 2.10 cm MV A velocity: 81.50 cm/s MV E/A ratio:  0.88 Buford Dresser MD Electronically signed by Buford Dresser MD Signature Date/Time: 12/24/2019/12:01:13 PM    Final    VAS [place] LOWER EXTREMITY VENOUS (DVT)  Result Date: 12/15/2019  Lower Venous DVT Study Indications: Elevated ddimer.  Comparison Study: 07/09/18 previous Performing Technologist: Abram Sander RVS  Examination Guidelines: A complete evaluation includes B-mode imaging, spectral Doppler, color Doppler, and power Doppler as needed of all accessible portions of each vessel. Bilateral testing is considered an integral part of a complete examination. Limited examinations for reoccurring indications may be performed as noted. The reflux portion of the exam is performed with the patient in reverse Trendelenburg.  +---------+---------------+---------+-----------+----------+--------------+ RIGHT    CompressibilityPhasicitySpontaneityPropertiesThrombus Aging +---------+---------------+---------+-----------+----------+--------------+ CFV      Full           Yes      Yes                                 +---------+---------------+---------+-----------+----------+--------------+ SFJ      Full                                                        +---------+---------------+---------+-----------+----------+--------------+  FV Prox  Full                                                        +---------+---------------+---------+-----------+----------+--------------+ FV Mid   Full                                                         +---------+---------------+---------+-----------+----------+--------------+ FV DistalFull                                                        +---------+---------------+---------+-----------+----------+--------------+ PFV      Full                                                        +---------+---------------+---------+-----------+----------+--------------+ POP      Full           Yes      Yes                                 +---------+---------------+---------+-----------+----------+--------------+ PTV      Full                                                        +---------+---------------+---------+-----------+----------+--------------+ PERO     Full                                                        +---------+---------------+---------+-----------+----------+--------------+   +---------+---------------+---------+-----------+----------+--------------+ LEFT     CompressibilityPhasicitySpontaneityPropertiesThrombus Aging +---------+---------------+---------+-----------+----------+--------------+ CFV      Full           Yes      Yes                                 +---------+---------------+---------+-----------+----------+--------------+ SFJ      Full                                                        +---------+---------------+---------+-----------+----------+--------------+ FV Prox  Full                                                        +---------+---------------+---------+-----------+----------+--------------+  FV Mid   Full                                                        +---------+---------------+---------+-----------+----------+--------------+ FV DistalFull                                                        +---------+---------------+---------+-----------+----------+--------------+ PFV      Full                                                         +---------+---------------+---------+-----------+----------+--------------+ POP      Full           Yes      Yes                                 +---------+---------------+---------+-----------+----------+--------------+ PTV      Full                                                        +---------+---------------+---------+-----------+----------+--------------+ PERO     Full                                                        +---------+---------------+---------+-----------+----------+--------------+     Summary: BILATERAL: - No evidence of deep vein thrombosis seen in the lower extremities, bilaterally. - No evidence of superficial venous thrombosis in the lower extremities, bilaterally. -No evidence of popliteal cyst, bilaterally. RIGHT: - Findings appear essentially unchanged compared to previous examination.   *See table(s) above for measurements and observations. Electronically signed by Deitra Mayo MD on 12/15/2019 at 6:22:41 PM.    Final     Phillips Climes M.D on 12/27/2019 at 11:27 AM  To page go to [email or web address]

## 2019-12-27 NOTE — Progress Notes (Signed)
Occupational Therapy Treatment Patient Details Name: Frank Love MRN: [id number] DOB: 10-04-1955 Today's Date: 12/27/2019    History of present illness 64 y.o. male with medical history significant for chronic systolic CHF, ischemic cardiomyopathy with LVEF 35 to 40%, coronary artery disease status post CABG, penile cancer, carotid artery stenosis, who presented to [facility] via EMS after being found severely hypoxic at home. In the ED, he required nonrebreather and high flow nasal cannula to maintain O2 saturation greater than 92%.  Work-up in the ED revealed severe acute hypoxic respiratory failure secondary to COVID-19 viral pneumonia. Unvaccinated. Admitted 12/13/19   OT comments  Increased difficulty with orthostatic hypotension yesterday. BP as follows: Orthostatic BPs     Sitting 137/70  Sitting after 0 min 93/59  Standing 85/59      Pt completed seated exercises followed by 3 trials of marching in standing position. After each trial (14 steps, 47 steps; 70 steps), systolic remained above 277 with less complaints of dizziness. Educated pt on importance of spending time during the day with feet down instead of constantly having feet elevated. Stressed importance of having feet down for awhile and completing seated exercises before standing. Will continue to follow acutely.   Follow Up Recommendations  Home health OT;Supervision/Assistance - 24 hour    Equipment Recommendations  3 in 1 bedside commode    Recommendations for Other Services      Precautions / Restrictions Precautions Precautions: Other (comment) Precaution Comments: orthostasis       Mobility Bed Mobility               General bed mobility comments: OOB in chair  Transfers                      Balance           Standing balance support: Bilateral upper extremity supported Standing balance-Leahy Scale: Poor                             ADL either performed or assessed  with clinical judgement   ADL       Grooming: Set up;Sitting Grooming Details (indicate cue type and reason): unable to stand to complete due to dizziness/hypotension Upper Body Bathing: Set up;Sitting   Lower Body Bathing: Min guard;Sit to/from stand   Upper Body Dressing : Set up;Sitting                           Vision       Perception     Praxis      Cognition Arousal/Alertness: Awake/alert Behavior During Therapy: Flat affect Overall Cognitive Status: Impaired/Different from baseline                                 General Comments: slow processing but improved from last visit        Exercises Other Exercises Other Exercises: Standing marching x 3 trials. 14 steps; 47 steps; 70 steps. BP remained above 100 on each trial Other Exercises: seated marching; heel raises x 20 3 trials Other Exercises: seated theraband activities   Shoulder Instructions       General Comments      Pertinent Vitals/ Pain       Pain Assessment: No/denies pain  Home Living  Prior Functioning/Environment              Frequency  Min 2X/week        Progress Toward Goals  OT Goals(current goals can now be found in the care plan section)  Progress towards OT goals: Progressing toward goals  Acute Rehab OT Goals Patient Stated Goal: to go home OT Goal Formulation: With patient Time For Goal Achievement: 12/30/19 Potential to Achieve Goals: Good ADL Goals Pt Will Perform Lower Body Bathing: with modified independence;sit to/from stand Pt Will Perform Lower Body Dressing: with modified independence;sit to/from stand Pt Will Transfer to Toilet: with modified independence;ambulating Pt Will Perform Toileting - Clothing Manipulation and hygiene: with modified independence;sit to/from stand Additional ADL Goal #1: Pt will independently verbalize 3 energy conservation precautions Additional  ADL Goal #2: Pt will independently demonstrate pursed lip breathing techniques  Plan Discharge plan remains appropriate;Frequency remains appropriate    Co-evaluation                 AM-PAC OT "6 Clicks" Daily Activity     Outcome Measure   Help from another person eating meals?: None Help from another person taking care of personal grooming?: A Little Help from another person toileting, which includes using toliet, bedpan, or urinal?: A Little Help from another person bathing (including washing, rinsing, drying)?: A Little Help from another person to put on and taking off regular upper body clothing?: A Little Help from another person to put on and taking off regular lower body clothing?: A Little 6 Click Score: 19    End of Session Equipment Utilized During Treatment: Rolling walker  OT Visit Diagnosis: Unsteadiness on feet (R26.81);Other abnormalities of gait and mobility (R26.89);Muscle weakness (generalized) (M62.81);Other symptoms and signs involving cognitive function;Dizziness and giddiness (R42)   Activity Tolerance Patient tolerated treatment well   Patient Left in chair;with call bell/phone within reach   Nurse Communication Mobility status;Other (comment) (progress mobility)        Time: 8469-6295 OT Time Calculation (min): 33 min  Charges: OT General Charges $OT Visit: 1 Visit OT Treatments $Therapeutic Activity: 8-22 mins $Therapeutic Exercise: 8-22 mins  Maurie Boettcher, OT/L   Acute OT Clinical Specialist Acute Rehabilitation Services Pager [phone] Office [phone]    [facility] 12/27/2019, 4:10 PM

## 2019-12-27 NOTE — Progress Notes (Addendum)
Advanced Heart Failure Rounding Note  PCP-Cardiologist: [name], MD   Subjective:    Orthostatic VS lying BP 113/63, HR 51; sitting 95/63, HR 63; standing 75/56, HR 82 and symptomatic with position changes.     Frank Love sitting up in chair, continues to report symptoms when changing position.  Denies chest pain, shortness of breath, and cough.   Echo: LVEF 45-50% (improved from prior study, previously 35-40%). RV mildly reduced. No significant valvular disease. No pericardial effusion.     Objective:   Weight Range: 110.9 kg Body mass index is 31.39 kg/m.   Vital Signs:   Temp:  [97.6 F (36.4 C)-97.9 F (36.6 C)] 97.6 F (36.4 C) (12/03 0445) Pulse Rate:  [47-51] 50 (12/03 0445) Resp:  [15-20] 18 (12/03 0445) BP: (103-119)/(58-70) 117/67 (12/03 0445) SpO2:  [90 %-100 %] 96 % (12/03 0445) Weight:  [110.9 kg] 110.9 kg (12/03 0445) Last BM Date: 12/26/19  Weight change: Filed Weights   12/25/19 0521 12/26/19 0457 12/27/19 0445  Weight: 110.6 kg 110.9 kg 110.9 kg    Intake/Output:   Intake/Output Summary (Last 24 hours) at 12/27/2019 0744 Last data filed at 12/26/2019 1835 Gross per 24 hour  Intake 200.49 ml  Output --  Net 200.49 ml      Physical Exam    General:  Fatigue and pale appearing. No resp difficulty HEENT: normal Neck: supple. no JVD.  Cor: PMI nondisplaced. Regular rate & rhythm. No rubs, gallops or murmurs. Lungs: Wheeze noted that cleared with cough.  Otherwise clear bilaterally.  Abdomen: soft, nontender, nondistended.  Extremities: no cyanosis, clubbing, rash.  Mild pedal edema.  Neuro: alert & oriented x 3,  moves all 4 extremities w/o difficulty. Affect pleasant   Telemetry   SB with rates 40-50s. Personally reviewed.   EKG    No new EKG to review   Labs    CBC Recent Labs    12/26/19 0051 12/27/19 0310  WBC 5.6 4.3  NEUTROABS 4.3 2.9  HGB 11.1* 11.5*  HCT 33.2* 35.7*  MCV 86.2 87.9  PLT 103* 92*   Basic  Metabolic Panel Recent Labs    12/26/19 0051 12/27/19 0310  NA 138 140  K 4.4 4.2  CL 105 103  CO2 27 28  GLUCOSE 97 89  BUN 36* 28*  CREATININE 1.06 1.18  CALCIUM 8.1* 8.3*  MG 2.1 1.9   Liver Function Tests No results for input(s): AST, ALT, ALKPHOS, BILITOT, PROT, ALBUMIN in the last 72 hours. No results for input(s): LIPASE, AMYLASE in the last 72 hours. Cardiac Enzymes No results for input(s): CKTOTAL, CKMB, CKMBINDEX, TROPONINI in the last 72 hours.  BNP: BNP (last 3 results) Recent Labs    12/25/19 0112 12/26/19 0051 12/27/19 0310  BNP 354.2* 355.6* 440.7*    ProBNP (last 3 results) No results for input(s): PROBNP in the last 8760 hours.   D-Dimer No results for input(s): DDIMER in the last 72 hours. Hemoglobin A1C No results for input(s): HGBA1C in the last 72 hours. Fasting Lipid Panel No results for input(s): CHOL, HDL, LDLCALC, TRIG, CHOLHDL, LDLDIRECT in the last 72 hours. Thyroid Function Tests No results for input(s): TSH, T4TOTAL, T3FREE, THYROIDAB in the last 72 hours.  Invalid input(s): FREET3  Other results:   Imaging    No results found.   Medications:     Scheduled Medications: . vitamin C  500 mg Oral Daily  . aspirin EC  81 mg Oral QHS  . atorvastatin  40 mg Oral q1800  . bisacodyl  10 mg Oral Daily  . cholecalciferol  1,000 Units Oral Daily  . enoxaparin (LOVENOX) injection  55 mg Subcutaneous Q24H  . feeding supplement (GLUCERNA SHAKE)  237 mL Oral TID BM  . folic acid  1 mg Oral Daily  . insulin aspart  0-9 Units Subcutaneous TID WC  . insulin glargine  6 Units Subcutaneous Daily  . Ipratropium-Albuterol  1 puff Inhalation BID  . melatonin  3 mg Oral QHS  . midodrine  10 mg Oral TID WC  . multivitamin with minerals  1 tablet Oral Daily  . pantoprazole  40 mg Oral Daily  . polyethylene glycol  17 g Oral Daily  . thiamine  100 mg Oral Daily  . zinc sulfate  220 mg Oral Daily    Infusions:   PRN  Medications: acetaminophen, nitroGLYCERIN, ondansetron (ZOFRAN) IV, sodium chloride    Assessment/Plan   1.  Acute respiratory failure 2/2 COVID-19 Viral PNA -Per primary team - Received remdesivir x 5 days,steroid taper, tocilizumab/baricitinib.  - Off continuous [place]  - Improved   2. Hypotension (hx of hypertension) - Received IVF bolus 11/29 and 12/2.    -Continue to hold home farxiga, dig, lasix, aldactone, entresto and coreg -Had similar episode post-CABG 11/2018 required NE and dopamine then transitioned to midodrine 5 mg TID.  - c/w TED - c/w abdominal binder while out of bed - Increase midodrine to 15 mg TID   3. 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 CAD: S/p CABG in 11/20. He graduated cardiac rehab 6/21  - Lipid panel good 7/21 - Continue ASA 81 daily.    5. Chronic systolic CHF 2/2 Ischemic cardiomyopathy.   - Pre-CABG echo with EF 30-35%, mild RV dysfunction.  Post-CABG echo with EF 30-35%, severe RV systolic dysfunction.  V/Q scan done, no evidence for PE. Hypotensive post-CABG requiring pressors and then midodrine, but improved over time.  -Echo repeated 3/21, EF 35-40% with moderate RV dysfunction, GIIDD. Cardiac MRI was recommended to quantify EF more exactly for purposes of ICD determination, however pt was adamant that he did not want an ICD and declined cMRI.  - Echo repeated this admit. LVEF improved to 45-50%. RV mildly reduced  - NYHA Class II - Holding home Coreg 6.25 mg bid; Entresto to 97-103 bid; spironolactone 25 mg daily; digoxin 0.125; Farxiga 5 mg daily.    - Once stable, will need to resume GMDT and follow up outpatient.    6. Carotid stenosis:  - dopplers 11/20 showed 40-59% Rt and 1-39% Lt ICA stenosis   - Repeat carotid dopplers study scheduled 01/14/2020.  - continue ASA and statin.    7. Acute on CKD -admission Cr 1.32, peak 1.51 on 11/22 - Baseline Cr around 1, stable  today -Continue to monitor BMP   8. Hyperkalemia - resolved  9. Thrombocytopenia -likely secondary to recent acute infection -On lovenox for VTE PPX   Length of Stay: [place], NP  12/27/2019, 7:44 AM  Advanced Heart Failure Team Pager [phone] (M-F; 7a - 4p)  Please contact [facility] for night-coverage after hours (4p -7a ) and weekends on [email or web address]  Agree with the above note .  Still orthostatic and symptomatic.  Orthostatic hypotension could be due to autonomic neuropathy as a consequence of COVID-19.  EF higher than in the past on echo, 45-50%.   - Encourage po fluid hydration, would  try to avoid further aggressive IV fluid.  - Increase midodrine to 15 mg tid.   - Graded compression stockings.  - Add abdominal binder.   We will be available as needed over the weekend, will see again Monday unless called.   Frank Love 12/27/2019 2:50 PM

## 2019-12-28 ENCOUNTER — Inpatient Hospital Stay (HOSPITAL_COMMUNITY): Payer: Self-pay

## 2019-12-28 LAB — CBC
HCT: 33.1 % — ABNORMAL LOW (ref 39.0–52.0)
Hemoglobin: 11.2 g/dL — ABNORMAL LOW (ref 13.0–17.0)
MCH: 29.6 pg (ref 26.0–34.0)
MCHC: 33.8 g/dL (ref 30.0–36.0)
MCV: 87.6 fL (ref 80.0–100.0)
Platelets: 86 10*3/uL — ABNORMAL LOW (ref 150–400)
RBC: 3.78 MIL/uL — ABNORMAL LOW (ref 4.22–5.81)
RDW: 16.1 % — ABNORMAL HIGH (ref 11.5–15.5)
WBC: 3.9 10*3/uL — ABNORMAL LOW (ref 4.0–10.5)
nRBC: 0 % (ref 0.0–0.2)

## 2019-12-28 LAB — BASIC METABOLIC PANEL
Anion gap: 7 (ref 5–15)
BUN: 26 mg/dL — ABNORMAL HIGH (ref 8–23)
CO2: 29 mmol/L (ref 22–32)
Calcium: 8.1 mg/dL — ABNORMAL LOW (ref 8.9–10.3)
Chloride: 104 mmol/L (ref 98–111)
Creatinine, Ser: 1.31 mg/dL — ABNORMAL HIGH (ref 0.61–1.24)
GFR, Estimated: >60 mL/min (ref >60)
Glucose, Bld: 93 mg/dL (ref 70–99)
Potassium: 4.2 mmol/L (ref 3.5–5.1)
Sodium: 140 mmol/L (ref 135–145)

## 2019-12-28 LAB — GLUCOSE, CAPILLARY
Glucose-Capillary: 89 mg/dL (ref 70–99)
Glucose-Capillary: 91 mg/dL (ref 70–99)
Glucose-Capillary: 147 mg/dL — ABNORMAL HIGH (ref 70–99)
Glucose-Capillary: 105 mg/dL — ABNORMAL HIGH (ref 70–99)

## 2019-12-28 MED ORDER — PROMETHAZINE HCL 25 MG/ML IJ SOLN
12.5000 mg | Freq: Once | INTRAMUSCULAR | Status: AC
  Administered 2019-12-28: 12.5 mg via INTRAVENOUS
  Filled 2019-12-28: qty 1

## 2019-12-28 MED ORDER — METOCLOPRAMIDE HCL 5 MG/ML IJ SOLN
5.0000 mg | Freq: Three times a day (TID) | INTRAMUSCULAR | Status: DC | PRN
  Administered 2019-12-28 – 2020-01-03 (×4): 5 mg via INTRAVENOUS
  Filled 2019-12-28 (×4): qty 2

## 2019-12-28 NOTE — Progress Notes (Signed)
Patient still reporting nausea, notified MD. New order received

## 2019-12-28 NOTE — Progress Notes (Signed)
PROGRESS NOTE                                                                                                                                                                                                             Patient Demographics:    Frank Love, is a 64 y.o. male, DOB - 1955/04/17, KDT:[id number]  Admit date - 12/13/2019   Admitting Physician Kayleen Memos, DO  Outpatient Primary MD for the patient is [name], NP  LOS - 15  Chief Complaint  Patient presents with  . Covid Exposure  . Shortness of Breath  . generalized weakness       Brief Narrative (HPI from H&P)  - 64 y.o.malewith medical history significant forchronic systolic CHF, ischemic cardiomyopathy with LVEF 35 to 40%, coronary artery disease status post CABG, penile cancer, carotid artery stenosis, who presented to [facility] via EMS after being found severely hypoxicathome.Unfortunately patient is unvaccinated against Covid, his work-up was significant for COVID-19 of pneumonia and severe hypoxia for which he is admitted by [facility].   Subjective:   Patient still complains of nausea, as well he remains profoundly orthostatic and symptomatic.  .  Assessment  & Plan :     Acute hypoxic respiratory failure due to COVID-19 pneumonia.   - He is unvaccinated and incurred severe parenchymal lung injur. -Initially requiring 30 L heated high flow nasal cannula, this has significantly improved, he is currently on room air. -He was treated with steroids, Remdesivir and Actemra. -He was encouraged use incentive spirometry and flutter valve.    Recent Labs  Lab 12/21/19 1406 12/21/19 1406 12/22/19 0235 12/22/19 0235 12/24/19 0130 12/25/19 0112 12/26/19 0051 12/27/19 0310 12/28/19 0355  WBC 7.3   < > 6.6   < > 4.6 5.3 5.6 4.3 3.9*  HGB 11.6*   < > 11.0*   < > 10.4* 10.6* 11.1* 11.5* 11.2*  HCT 36.7*   < > 33.8*   < > 32.1* 31.7* 33.2* 35.7* 33.1*  PLT 190   < > 174   < > 124* 105* 103*  92* 86*  BNP 662.9*  --   --   --  283.5* 354.2* 355.6* 440.7*  --   AST 30  --  25  --   --   --   --   --   --   ALT 60*  --  55*  --   --   --   --   --   --  ALKPHOS 45  --  39  --   --   --   --   --   --   BILITOT 1.0  --  1.1  --   --   --   --   --   --   ALBUMIN 2.5*  --  2.4*  --   --   --   --   --   --    < > = values in this interval not displayed.     Chronic combined systolic and diastolic heart failure -  recent echocardiogram shows EF of 35%.  Repeat 2D echo during hospital stay showing improved EF 45%.   -Management by CHF team, cardiology . -Home medication including Coreg, Wilder Glade, Entresto and digoxin on hold given low blood pressure and bradycardia .  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 -Patient remains significantly orthostatic this morning despite increasing his midodrine to 15 mg 3 times daily, using his TED hose and abdominal binder and receiving multiple fluid boluses, further management per CHF team .  Dyslipidemia.  On statin.  Thrombocytopenia -Continue to monitor closely, on Lovenox for DVT prophylaxis  CAD s/p CABG.  On aspirin and statin combination, low-dose beta-blocker if tolerated by blood pressure and heart rate.  Hyperkalemia.  Treated will monitor.  Mildly suppressed TSH.  Free T4 slightly high.  Since he is bradycardic and hypotensive I do not think he is a exhibiting any signs of hyperthyroidism, will request to repeat TSH, T3 and free T4 in 4 weeks by PCP.  Patient complains of left foot pain this morning as it did hit the walker yesterday, will obtain x-ray.   Condition - Extremely Guarded  Family Communication  : Discussed with patient in details, answered all his questions.  Daughter updated by phone 12/2  Code Status :  Full  Consults  :  Cards  Procedures  :    TTE  Leg [place] - No DVT  PUD Prophylaxis :  PPI  Disposition Plan  :    Status is: Inpatient  Remains inpatient appropriate because:IV treatments appropriate due to intensity of illness or inability to take PO   Dispo:  Patient From: Home  Planned Disposition: Home  Expected discharge date: 12/29/19  Medically stable for discharge: No   DVT Prophylaxis  :  Lovenox   Lab Results  Component Value Date   PLT 86 (L) 12/28/2019    Diet :  Diet Order            Diet regular Room service appropriate? Yes; Fluid consistency: Thin  Diet effective now                  Inpatient Medications Scheduled Meds: . vitamin C  500 mg Oral Daily  . aspirin EC  81 mg Oral QHS  . atorvastatin  40 mg Oral q1800  . bisacodyl  10 mg Oral Daily  . cholecalciferol  1,000 Units Oral Daily  . enoxaparin (LOVENOX) injection  55 mg Subcutaneous Q24H  . feeding supplement (GLUCERNA SHAKE)  237 mL Oral TID BM  . folic acid  1 mg Oral Daily  . insulin aspart  0-9 Units Subcutaneous TID WC  . insulin glargine  6 Units Subcutaneous Daily  . Ipratropium-Albuterol  1 puff Inhalation BID  . melatonin  3 mg Oral QHS  . midodrine  15 mg Oral TID WC  . multivitamin  with minerals  1 tablet Oral Daily  . pantoprazole  40 mg Oral Daily  . polyethylene glycol  17 g Oral Daily  . thiamine  100 mg Oral Daily  . zinc sulfate  220 mg Oral Daily   Continuous Infusions:  PRN Meds:.acetaminophen, metoCLOPramide (REGLAN) injection, nitroGLYCERIN, ondansetron (ZOFRAN) IV, sodium chloride  Antibiotics  :   Anti-infectives (From admission, onward)   Start     Dose/Rate Route Frequency Ordered Stop   12/15/19 1000  remdesivir 100 mg in sodium chloride 0.9 % 100 mL IVPB       "Followed by" Linked Group Details   100 mg 200 mL/hr over 30 Minutes Intravenous Daily 12/14/19 0002 12/18/19 0849   12/14/19 1000  remdesivir 100 mg in sodium chloride 0.9 % 100 mL IVPB  Status:  Discontinued       "Followed by" Linked Group Details   100 mg 200 mL/hr over 30  Minutes Intravenous Daily 12/13/19 2358 12/14/19 0001   12/14/19 0030  remdesivir 100 mg in sodium chloride 0.9 % 100 mL IVPB       "Followed by" Linked Group Details   100 mg 200 mL/hr over 30 Minutes Intravenous Every 30 min 12/14/19 0002 12/14/19 0210   12/14/19 0000  remdesivir 200 mg in sodium chloride 0.9% 250 mL IVPB  Status:  Discontinued       "Followed by" Linked Group Details   200 mg 580 mL/hr over 30 Minutes Intravenous Once 12/13/19 2358 12/14/19 0001   12/13/19 2245  vancomycin (VANCOREADY) IVPB 2000 mg/400 mL        2,000 mg 200 mL/hr over 120 Minutes Intravenous  Once 12/13/19 2241 12/14/19 0210   12/13/19 2245  ceFEPIme (MAXIPIME) 2 g in sodium chloride 0.9 % 100 mL IVPB        2 g 200 mL/hr over 30 Minutes Intravenous  Once 12/13/19 2241 12/14/19 0002          Objective:   Vitals:   12/27/19 2202 12/28/19 0000 12/28/19 0402 12/28/19 0833  BP: (!) 102/58 (!) 96/58 (!) 96/58   Pulse: (!) 48 (!) 47 (!) 47 83  Resp: 18 17 18 18   Temp: 97.6 F (36.4 C) 97.7 F (36.5 C) 98 F (36.7 C) (!) 97.3 F (36.3 C)  TempSrc: Oral Oral Oral Oral  SpO2: 94% 95% 94% (!) 86%  Weight:   108.8 kg   Height:        SpO2: (!) 86 % O2 Flow Rate (L/min): 1 L/min FiO2 (%): 36 %  Wt Readings from Last 3 Encounters:  12/28/19 108.8 kg  09/10/19 114.8 kg  08/13/19 114.8 kg     Intake/Output Summary (Last 24 hours) at 12/28/2019 1202 Last data filed at 12/27/2019 1328 Gross per 24 hour  Intake 250 ml  Output --  Net 250 ml     Physical Exam  Awake Alert, Oriented X 3, No new F.N deficits, Normal affect Symmetrical Chest wall movement, Good air movement bilaterally, CTAB RRR,No Gallops,Rubs or new Murmurs, No Parasternal Heave +ve B.Sounds, Abd Soft, No tenderness, No rebound - guarding or rigidity. No Cyanosis, Clubbing, some trace edema bilaterally     Data Review:   Recent Labs  Lab 12/24/19 0130 12/25/19 0112 12/26/19 0051 12/27/19 0310 12/28/19 0355   WBC 4.6 5.3 5.6 4.3 3.9*  HGB 10.4* 10.6* 11.1* 11.5* 11.2*  HCT 32.1* 31.7* 33.2* 35.7* 33.1*  PLT 124* 105* 103* 92* 86*  MCV 87.0 86.4 86.2 87.9 87.6  MCH 28.2 28.9 28.8 28.3 29.6  MCHC 32.4 33.4 33.4 32.2 33.8  RDW 14.8 15.1 15.2 15.6* 16.1*  LYMPHSABS 0.5* 0.6* 0.7 0.8  --   MONOABS 0.3 0.3 0.4 0.3  --   EOSABS 0.0 0.1 0.1 0.2  --   BASOSABS 0.0 0.0 0.0 0.0  --     Recent Labs  Lab 12/21/19 1406 12/21/19 1406 12/22/19 0235 12/23/19 0309 12/23/19 1127 12/23/19 1127 12/24/19 0130 12/25/19 0112 12/26/19 0051 12/27/19 0310 12/28/19 0355  NA 137   < > 139  --  139   < > 141 140 138 140 140  K 5.3*   < > 5.9*   < > 4.4   < > 4.3 4.3 4.4 4.2 4.2  CL 109   < > 109  --  108   < > 104 105 105 103 104  CO2 18*   < > 22  --  24   < > 27 27 27 28 29   GLUCOSE 225*   < > 170*  --  100*   < > 123* 114* 97 89 93  BUN 60*   < > 56*  --  53*   < > 50* 43* 36* 28* 26*  CREATININE 1.23   < > 1.19  --  1.27*   < > 1.11 1.13 1.06 1.18 1.31*  CALCIUM 8.0*   < > 8.0*  --  7.7*   < > 7.6* 7.9* 8.1* 8.3* 8.1*  AST 30  --  25  --   --   --   --   --   --   --   --   ALT 60*  --  55*  --   --   --   --   --   --   --   --   ALKPHOS 45  --  39  --   --   --   --   --   --   --   --   BILITOT 1.0  --  1.1  --   --   --   --   --   --   --   --   ALBUMIN 2.5*  --  2.4*  --   --   --   --   --   --   --   --   MG  --   --   --   --  2.5*  --  2.6* 2.4 2.1 1.9  --   TSH  --   --   --   --  0.059*  --   --   --   --   --   --   BNP 662.9*  --   --   --   --   --  283.5* 354.2* 355.6* 440.7*  --    < > = values in this interval not displayed.    Recent Labs  Lab 12/21/19 1406 12/24/19 0130 12/25/19 0112 12/26/19 0051 12/27/19 0310  BNP 662.9* 283.5* 354.2* 355.6* 440.7*    ------------------------------------------------------------------------------------------------------------------ No results for input(s): CHOL, HDL, LDLCALC, TRIG, CHOLHDL, LDLDIRECT in the last 72 hours.  Lab  Results  Component Value Date   HGBA1C 6.1 (H) 12/14/2019   ------------------------------------------------------------------------------------------------------------------ No results for input(s): TSH, T4TOTAL, T3FREE, THYROIDAB in the last 72 hours.  Invalid input(s): FREET3 ------------------------------------------------------------------------------------------------------------------ No results for input(s): VITAMINB12, FOLATE, FERRITIN, TIBC, IRON, RETICCTPCT in the last 72 hours.  Coagulation profile No  results for input(s): INR, PROTIME in the last 168 hours.  No results for input(s): DDIMER in the last 72 hours.  Cardiac Enzymes No results for input(s): CKMB, TROPONINI, MYOGLOBIN in the last 168 hours.  Invalid input(s): CK ------------------------------------------------------------------------------------------------------------------    Component Value Date/Time   BNP 440.7 (H) 12/27/2019 0310    Micro Results No results found for this or any previous visit (from the past 240 hour(s)).  Radiology Reports DG Chest Portable 1 View  Result Date: 12/13/2019 CLINICAL DATA:  Shortness of breath, suspicion of COVID EXAM: PORTABLE CHEST 1 VIEW COMPARISON:  Radiograph 01/07/2019, CT 11/20/2018 FINDINGS: Heterogeneous consolidative and hazy opacity is present in a lower lung and peripheral predominance most coalescent in the left lung periphery. Some more bandlike opacities, possibly subsegmental atelectatic changes or scarring. No pneumothorax. No visible effusion though the costophrenic sulci are partially collimated. Postsurgical changes related to prior CABG including intact and aligned sternotomy wires and multiple surgical clips projecting over the mediastinum. Telemetry leads overlie the chest. The aorta is calcified. The remaining cardiomediastinal contours are unremarkable. Degenerative changes are present in the imaged spine and shoulders. IMPRESSION: 1. Heterogeneous  consolidative and hazy opacity in a lower lung and peripheral predominance most coalescent in the left lung periphery, compatible with a multifocal pneumonia including atypical viral etiology in the appropriate clinical setting. 2. More bandlike opacities likely reflect subsegmental atelectatic changes or scarring. Electronically Signed   By: Lovena Le M.D.   On: 12/13/2019 22:28   ECHOCARDIOGRAM COMPLETE  Result Date: 12/24/2019    ECHOCARDIOGRAM REPORT   Patient Name:   Frank Love Date of Exam: 12/24/2019 Medical Rec #:  [id number]          Height:       74.0 in Accession #:    [id number]         Weight:       247.6 lb Date of Birth:  [birth year]          BSA:          2.380 m Patient Age:    3 years           BP:           102/54 mmHg Patient Gender: M                  HR:           61 bpm. Exam Location:  Inpatient Procedure: 2D Echo, Cardiac Doppler and Color Doppler Indications:    Y30.16 Chronic systolic (congestive) heart failure  History:        Patient has prior history of Echocardiogram examinations, most                 recent 04/18/2019. COVID-19 Positive.  Sonographer:    Jonelle Sidle Dance Referring Phys: [id number] Carlene Coria  Sonographer Comments: No subcostal window. IMPRESSIONS  1. Left ventricular ejection fraction, by estimation, is 45 to 50%. The left ventricle has mildly decreased function. The left ventricle demonstrates global hypokinesis. The left ventricular internal cavity size was mildly dilated. There is mild left ventricular hypertrophy. Left ventricular diastolic parameters are indeterminate.  2. Right ventricular systolic function is mildly reduced. The right ventricular size is normal.  3. Left atrial size was moderately dilated.  4. The mitral valve is grossly normal. Trivial mitral valve regurgitation. No evidence of mitral stenosis.  5. The aortic valve is grossly normal. Aortic valve regurgitation is trivial. No aortic stenosis is present. Comparison(s):  Prior images  reviewed side by side. Changes from prior study are noted. Conclusion(s)/Recommendation(s): No hemodynamically significant valvular heart disease. LV function appears improved when directly compared to prior. FINDINGS  Left Ventricle: Left ventricular ejection fraction, by estimation, is 45 to 50%. The left ventricle has mildly decreased function. The left ventricle demonstrates global hypokinesis. The left ventricular internal cavity size was mildly dilated. There is  mild left ventricular hypertrophy. Left ventricular diastolic parameters are indeterminate. Right Ventricle: The right ventricular size is normal. Right vetricular wall thickness was not well visualized. Right ventricular systolic function is mildly reduced. Left Atrium: Left atrial size was moderately dilated. Right Atrium: Right atrial size was normal in size. Pericardium: There is no evidence of pericardial effusion. Mitral Valve: The mitral valve is grossly normal. Trivial mitral valve regurgitation. No evidence of mitral valve stenosis. Tricuspid Valve: The tricuspid valve is normal in structure. Tricuspid valve regurgitation is trivial. Aortic Valve: The aortic valve is grossly normal. Aortic valve regurgitation is trivial. Aortic regurgitation PHT measures 521 msec. No aortic stenosis is present. Pulmonic Valve: The pulmonic valve was not well visualized. Pulmonic valve regurgitation is mild. No evidence of pulmonic stenosis. Aorta: The aortic root and ascending aorta are structurally normal, with no evidence of dilitation and the aortic arch was not well visualized. Venous: The inferior vena cava was not well visualized. IAS/Shunts: The atrial septum is grossly normal.  LEFT VENTRICLE PLAX 2D LVIDd:         5.65 cm  Diastology LVIDs:         4.13 cm  LV e' medial:    6.09 cm/s LV PW:         1.40 cm  LV E/e' medial:  11.8 LV IVS:        1.12 cm  LV e' lateral:   7.51 cm/s LVOT diam:     2.10 cm  LV E/e' lateral: 9.6 LV SV:         74 LV SV  Index:   31 LVOT Area:     3.46 cm  RIGHT VENTRICLE RV Basal diam:  2.96 cm RV S prime:     7.40 cm/s TAPSE (M-mode): 1.4 cm LEFT ATRIUM              Index       RIGHT ATRIUM           Index LA diam:        5.50 cm  2.31 cm/m  RA Area:     13.80 cm LA Vol (A2C):   114.0 ml 47.90 ml/m RA Volume:   28.40 ml  11.93 ml/m LA Vol (A4C):   87.6 ml  36.81 ml/m LA Biplane Vol: 101.0 ml 42.44 ml/m  AORTIC VALVE LVOT Vmax:   85.00 cm/s LVOT Vmean:  60.800 cm/s LVOT VTI:    0.213 m AI PHT:      521 msec  AORTA Ao Root diam: 3.50 cm Ao Asc diam:  3.70 cm MITRAL VALVE MV Area (PHT): 2.54 cm    SHUNTS MV Decel Time: 299 msec    Systemic VTI:  0.21 m MV E velocity: 71.80 cm/s  Systemic Diam: 2.10 cm MV A velocity: 81.50 cm/s MV E/A ratio:  0.88 Buford Dresser MD Electronically signed by Buford Dresser MD Signature Date/Time: 12/24/2019/12:01:13 PM    Final    VAS [place] LOWER EXTREMITY VENOUS (DVT)  Result Date: 12/15/2019  Lower Venous DVT Study Indications: Elevated ddimer.  Comparison Study: 07/09/18 previous Performing  Technologist: Abram Sander RVS  Examination Guidelines: A complete evaluation includes B-mode imaging, spectral Doppler, color Doppler, and power Doppler as needed of all accessible portions of each vessel. Bilateral testing is considered an integral part of a complete examination. Limited examinations for reoccurring indications may be performed as noted. The reflux portion of the exam is performed with the patient in reverse Trendelenburg.  +---------+---------------+---------+-----------+----------+--------------+ RIGHT    CompressibilityPhasicitySpontaneityPropertiesThrombus Aging +---------+---------------+---------+-----------+----------+--------------+ CFV      Full           Yes      Yes                                 +---------+---------------+---------+-----------+----------+--------------+ SFJ      Full                                                         +---------+---------------+---------+-----------+----------+--------------+ FV Prox  Full                                                        +---------+---------------+---------+-----------+----------+--------------+ FV Mid   Full                                                        +---------+---------------+---------+-----------+----------+--------------+ FV DistalFull                                                        +---------+---------------+---------+-----------+----------+--------------+ PFV      Full                                                        +---------+---------------+---------+-----------+----------+--------------+ POP      Full           Yes      Yes                                 +---------+---------------+---------+-----------+----------+--------------+ PTV      Full                                                        +---------+---------------+---------+-----------+----------+--------------+ PERO     Full                                                        +---------+---------------+---------+-----------+----------+--------------+   +---------+---------------+---------+-----------+----------+--------------+  LEFT     CompressibilityPhasicitySpontaneityPropertiesThrombus Aging +---------+---------------+---------+-----------+----------+--------------+ CFV      Full           Yes      Yes                                 +---------+---------------+---------+-----------+----------+--------------+ SFJ      Full                                                        +---------+---------------+---------+-----------+----------+--------------+ FV Prox  Full                                                        +---------+---------------+---------+-----------+----------+--------------+ FV Mid   Full                                                         +---------+---------------+---------+-----------+----------+--------------+ FV DistalFull                                                        +---------+---------------+---------+-----------+----------+--------------+ PFV      Full                                                        +---------+---------------+---------+-----------+----------+--------------+ POP      Full           Yes      Yes                                 +---------+---------------+---------+-----------+----------+--------------+ PTV      Full                                                        +---------+---------------+---------+-----------+----------+--------------+ PERO     Full                                                        +---------+---------------+---------+-----------+----------+--------------+     Summary: BILATERAL: - No evidence of deep vein thrombosis seen in the lower extremities, bilaterally. - No evidence of superficial venous thrombosis in the lower extremities, bilaterally. -No evidence of popliteal cyst, bilaterally. RIGHT: - Findings appear essentially unchanged compared to previous examination.   *  See table(s) above for measurements and observations. Electronically signed by Deitra Mayo MD on 12/15/2019 at 6:22:41 PM.    Final     Phillips Climes M.D on 12/28/2019 at 12:02 PM  To page go to [email or web address]

## 2019-12-28 NOTE — Progress Notes (Signed)
   12/27/19 2007  Assess: MEWS Score  Temp 97.6 F (36.4 C)  BP (!) 92/50  Pulse Rate (!) 50  ECG Heart Rate (!) 49  Resp 15  Level of Consciousness Alert  SpO2 93 %  O2 Device Room Air  Patient Activity (if Appropriate) In bed  Assess: MEWS Score  MEWS Temp 0  MEWS Systolic 1  MEWS Pulse 1  MEWS RR 0  MEWS LOC 0  MEWS Score 2  MEWS Score Color Yellow  Assess: if the MEWS score is Yellow or Red  Were vital signs taken at a resting state? Yes  Focused Assessment No change from prior assessment  Early Detection of Sepsis Score *See Row Information* Low  MEWS guidelines implemented *See Row Information* Yes  Treat  Pain Scale 0-10  Pain Score 0  Take Vital Signs  Increase Vital Sign Frequency  Yellow: Q 2hr X 2 then Q 4hr X 2, if remains yellow, continue Q 4hrs  Escalate  MEWS: Escalate Yellow: discuss with charge nurse/RN and consider discussing with provider and RRT  Notify: Charge Nurse/RN  Name of Charge Nurse/RN Notified Nikki RN  Date Charge Nurse/RN Notified 12/27/19  Notify: Provider  Provider Name/Title n/a  Notify: Rapid Response  Name of Rapid Response RN Notified n/a  Document  Patient Outcome Other (Comment) (no change in assessment, will continue to monitor. )  Progress note created (see row info) Yes

## 2019-12-28 NOTE — Progress Notes (Signed)
Occupational Therapy Treatment Patient Details Name: Frank Love MRN: [id number] DOB: January 08, 1956 Today's Date: 12/28/2019    History of present illness 64 y.o. male with medical history significant for chronic systolic CHF, ischemic cardiomyopathy with LVEF 35 to 40%, coronary artery disease status post CABG, penile cancer, carotid artery stenosis, who presented to [facility] via EMS after being found severely hypoxic at home. In the ED, he required nonrebreather and high flow nasal cannula to maintain O2 saturation greater than 92%.  Work-up in the ED revealed severe acute hypoxic respiratory failure secondary to COVID-19 viral pneumonia. Unvaccinated. Admitted 12/13/19   OT comments  Pt. Seen for skilled OT session.  Focus remains increasing activity tolerance in conjunction with orthostatic BP, nausea and maintianing OT SATS.  Pt. States nausea is constant but was able to tolerate LB dressing S seated, and 2 sets of marching in place 30 seconds each.  Able to ambulate 5 steps forward and backward.  Expresses interest in exercising throughout the day.  Reviewed seated exercises he could complete on his own.  Also encouraged to assist with all ADLS that he was able as part of increasing activity tolerance.    BP AND O2 INFO. (PT. ON RA)  BP UPON ARRIVAL,PT. SEATED 92/60 O2 94% INITIAL STANDING 65/49-55, O2 94% SEATED AFTER 1ST MARCHING 94/57-70 O2 91% SEATED AFTER 2ND MARCHING INCLUDING AMBULATION 93/67-75 O2 93 %   Follow Up Recommendations  Home health OT;Supervision/Assistance - 24 hour    Equipment Recommendations  3 in 1 bedside commode    Recommendations for Other Services      Precautions / Restrictions Precautions Precaution Comments: orthostasis Restrictions Weight Bearing Restrictions: No       Mobility Bed Mobility               General bed mobility comments: OOB in chair  Transfers Overall transfer level: Needs assistance Equipment used: Rolling walker (2  wheeled) Transfers: Sit to/from Stand Sit to Stand: Min guard         General transfer comment: pt. completed marching in place approx. 30 seconds each time x2.  also took 5 steps forward and backwards but states he prefered marching in place for now    Balance                                           ADL either performed or assessed with clinical judgement   ADL Overall ADL's : Needs assistance/impaired                     Lower Body Dressing: Supervision/safety;Sitting/lateral leans Lower Body Dressing Details (indicate cue type and reason): able to don socks seated, states he bent forward to don prior to admit, able to do so today and maintain 98% during and after task completion               General ADL Comments: continued focus on orthostatic BP and managing nausea but pt. with increased activity tolerance today     Vision       Perception     Praxis      Cognition Arousal/Alertness: Awake/alert Behavior During Therapy: Flat affect  Exercises     Shoulder Instructions       General Comments  Pt. Likes to read books and walk in the park near his house. Has 3 grandchildren.  Lives and is from [place]    Pertinent Vitals/ Pain       Pain Assessment: Faces Faces Pain Scale: Hurts a little bit Pain Descriptors / Indicators: Other (Comment) (nausea-constant but no vomiting during session) Pain Intervention(s): RN gave pain meds during session  Home Living                                          Prior Functioning/Environment              Frequency  Min 2X/week        Progress Toward Goals  OT Goals(current goals can now be found in the care plan section)  Progress towards OT goals: Progressing toward goals     Plan Discharge plan remains appropriate;Frequency remains appropriate    Co-evaluation                 AM-PAC  OT "6 Clicks" Daily Activity     Outcome Measure   Help from another person eating meals?: None Help from another person taking care of personal grooming?: A Little Help from another person toileting, which includes using toliet, bedpan, or urinal?: A Little Help from another person bathing (including washing, rinsing, drying)?: A Little Help from another person to put on and taking off regular upper body clothing?: A Little Help from another person to put on and taking off regular lower body clothing?: A Little 6 Click Score: 19    End of Session Equipment Utilized During Treatment: Rolling walker  OT Visit Diagnosis: Unsteadiness on feet (R26.81);Other abnormalities of gait and mobility (R26.89);Muscle weakness (generalized) (M62.81);Other symptoms and signs involving cognitive function;Dizziness and giddiness (R42)   Activity Tolerance Patient tolerated treatment well   Patient Left in chair;with call bell/phone within reach;with nursing/sitter in room   Nurse Communication Other (comment) (rn states ok to work with pt., providing more nausea meds at end of session)        Time: 1130-1200 OT Time Calculation (min): 30 min  Charges: OT General Charges $OT Visit: 1 Visit OT Treatments $Therapeutic Activity: 23-37 mins  Sonia Baller, COTA/L Acute Rehabilitation [phone]   Janice Coffin 12/28/2019, 12:17 PM

## 2019-12-28 NOTE — Progress Notes (Signed)
Patient reporting nausea that zofran did not help. Notified MD, new order received.

## 2019-12-29 DIAGNOSIS — I2583 Coronary atherosclerosis due to lipid rich plaque: Secondary | ICD-10-CM

## 2019-12-29 DIAGNOSIS — I2510 Atherosclerotic heart disease of native coronary artery without angina pectoris: Secondary | ICD-10-CM

## 2019-12-29 LAB — BASIC METABOLIC PANEL
Anion gap: 8 (ref 5–15)
BUN: 26 mg/dL — ABNORMAL HIGH (ref 8–23)
CO2: 29 mmol/L (ref 22–32)
Calcium: 8 mg/dL — ABNORMAL LOW (ref 8.9–10.3)
Chloride: 105 mmol/L (ref 98–111)
Creatinine, Ser: 1.31 mg/dL — ABNORMAL HIGH (ref 0.61–1.24)
GFR, Estimated: >60 mL/min (ref >60)
Glucose, Bld: 92 mg/dL (ref 70–99)
Potassium: 4.2 mmol/L (ref 3.5–5.1)
Sodium: 142 mmol/L (ref 135–145)

## 2019-12-29 LAB — GLUCOSE, CAPILLARY
Glucose-Capillary: 92 mg/dL (ref 70–99)
Glucose-Capillary: 112 mg/dL — ABNORMAL HIGH (ref 70–99)
Glucose-Capillary: 110 mg/dL — ABNORMAL HIGH (ref 70–99)
Glucose-Capillary: 117 mg/dL — ABNORMAL HIGH (ref 70–99)

## 2019-12-29 LAB — CBC
HCT: 33.3 % — ABNORMAL LOW (ref 39.0–52.0)
Hemoglobin: 11 g/dL — ABNORMAL LOW (ref 13.0–17.0)
MCH: 29.3 pg (ref 26.0–34.0)
MCHC: 33 g/dL (ref 30.0–36.0)
MCV: 88.8 fL (ref 80.0–100.0)
Platelets: 85 10*3/uL — ABNORMAL LOW (ref 150–400)
RBC: 3.75 MIL/uL — ABNORMAL LOW (ref 4.22–5.81)
RDW: 16.6 % — ABNORMAL HIGH (ref 11.5–15.5)
WBC: 3.4 10*3/uL — ABNORMAL LOW (ref 4.0–10.5)
nRBC: 0 % (ref 0.0–0.2)

## 2019-12-29 MED ORDER — COSYNTROPIN 0.25 MG IJ SOLR
0.2500 mg | Freq: Once | INTRAMUSCULAR | Status: AC
  Administered 2019-12-30: 0.25 mg via INTRAVENOUS
  Filled 2019-12-29: qty 0.25

## 2019-12-29 MED ORDER — IPRATROPIUM-ALBUTEROL 20-100 MCG/ACT IN AERS
1.0000 | INHALATION_SPRAY | Freq: Four times a day (QID) | RESPIRATORY_TRACT | Status: DC | PRN
  Administered 2019-12-31: 1 via RESPIRATORY_TRACT

## 2019-12-29 NOTE — Progress Notes (Addendum)
PROGRESS NOTE                                                                                                                                                                                                             Patient Demographics:    Frank Love, is a 64 y.o. male, DOB - 10-22-1955, GUY:[id number]  Admit date - 12/13/2019   Admitting Physician Kayleen Memos, DO  Outpatient Primary MD for the patient is [name], NP  LOS - 16  Chief Complaint  Patient presents with  . Covid Exposure  . Shortness of Breath  . generalized weakness       Brief Narrative (HPI from H&P)  - 64 y.o.malewith medical history significant forchronic systolic CHF, ischemic cardiomyopathy with LVEF 35 to 40%, coronary artery disease status post CABG, penile cancer, carotid artery stenosis, who presented to [facility] via EMS after being found severely hypoxicathome.Unfortunately patient is unvaccinated against Covid, his work-up was significant for COVID-19 of pneumonia and severe hypoxia for which he is admitted by [facility].   Subjective:   Patient this morning denies any nausea, but he is still significantly symptomatic with his orthostasis upon standing.   Assessment  & Plan :     Acute hypoxic respiratory failure due to COVID-19 pneumonia.   - He is unvaccinated and incurred severe parenchymal lung injur. -Initially requiring 30 L heated high flow nasal cannula, this has significantly improved, he is currently on room air. -He was treated with steroids, Remdesivir and Actemra. -He was encouraged use incentive spirometry and flutter valve.    Recent Labs  Lab 12/24/19 0130 12/24/19 0130 12/25/19 0112 12/26/19 0051 12/27/19 0310 12/28/19 0355 12/29/19 0439  WBC 4.6   < > 5.3 5.6 4.3 3.9* 3.4*  HGB 10.4*   < > 10.6* 11.1* 11.5* 11.2* 11.0*  HCT 32.1*   < > 31.7* 33.2* 35.7* 33.1* 33.3*  PLT 124*   < > 105* 103* 92* 86* 85*  BNP 283.5*  --  354.2* 355.6* 440.7*  --    --    < > = values in this interval not displayed.     Chronic combined systolic and diastolic heart failure -  recent echocardiogram shows EF of 35%.  Repeat 2D echo during hospital stay showing improved EF 45%.   -Management by CHF team, cardiology . -Home medication including Coreg, Wilder Glade, Entresto and digoxin on hold given low blood pressure and bradycardia .  Orthostasis -Cardiology/CHF team input greatly appreciated ,  his orthostasis felt to be secondary to autonomic dysfunction from COVID-19 infection . -He remains significantly orthostatic despite being on maximum dose midodrine 15 mg 3 times daily, and being compliant with his TED hose and abdominal binder, and received fluid boluses as well, all his antihypertensive and diuretic regimen remains on hold. -Check cosyntropin test in a.m.Marland Kitchen  Dyslipidemia.  On statin.  Thrombocytopenia -Continue to monitor closely, on Lovenox for DVT prophylaxis  CAD s/p CABG.  On aspirin and statin combination, blockers has been stopped due to bradycardia and orthostasis .  Hyperkalemia.  Treated will monitor.  Mildly suppressed TSH.  Free T4 slightly high.  Since he is bradycardic and hypotensive I do not think he is a exhibiting any signs of hyperthyroidism, will request to repeat TSH, T3 and free T4 in 4 weeks by PCP.  Left foot pain has significantly improved, left foot x-ray with no acute findings.   Condition - Extremely Guarded  Family Communication  : Discussed with patient in details, answered all his questions.  Daughter updated by phone 12/2  Code Status :  Full  Consults  :  Cards  Procedures  :    TTE  Leg [place] - No DVT  PUD Prophylaxis : PPI  Disposition Plan  :    Status is: Inpatient  Remains inpatient appropriate because:IV treatments appropriate due to intensity of illness or inability to take PO   Dispo:  Patient From:    Planned Disposition:    Expected discharge date: 12/29/19  Medically stable for  discharge:     DVT Prophylaxis  :  Lovenox   Lab Results  Component Value Date   PLT 85 (L) 12/29/2019    Diet :  Diet Order            Diet regular Room service appropriate? Yes; Fluid consistency: Thin  Diet effective now                  Inpatient Medications Scheduled Meds: . vitamin C  500 mg Oral Daily  . aspirin EC  81 mg Oral QHS  . atorvastatin  40 mg Oral q1800  . bisacodyl  10 mg Oral Daily  . cholecalciferol  1,000 Units Oral Daily  . [START ON 12/30/2019] cosyntropin  0.25 mg Intravenous Once  . enoxaparin (LOVENOX) injection  55 mg Subcutaneous Q24H  . feeding supplement (GLUCERNA SHAKE)  237 mL Oral TID BM  . folic acid  1 mg Oral Daily  . insulin aspart  0-9 Units Subcutaneous TID WC  . insulin glargine  6 Units Subcutaneous Daily  . melatonin  3 mg Oral QHS  . midodrine  15 mg Oral TID WC  . multivitamin with minerals  1 tablet Oral Daily  . pantoprazole  40 mg Oral Daily  . polyethylene glycol  17 g Oral Daily  . thiamine  100 mg Oral Daily  . zinc sulfate  220 mg Oral Daily   Continuous Infusions:  PRN Meds:.acetaminophen, Ipratropium-Albuterol, metoCLOPramide (REGLAN) injection, nitroGLYCERIN, ondansetron (ZOFRAN) IV, sodium chloride  Antibiotics  :   Anti-infectives (From admission, onward)   Start     Dose/Rate Route Frequency Ordered Stop   12/15/19 1000  remdesivir 100 mg in sodium chloride 0.9 % 100 mL IVPB       "Followed by" Linked Group Details   100 mg 200 mL/hr over 30 Minutes Intravenous Daily 12/14/19 0002 12/18/19 0849   12/14/19 1000  remdesivir 100 mg in sodium chloride 0.9 % 100  mL IVPB  Status:  Discontinued       "Followed by" Linked Group Details   100 mg 200 mL/hr over 30 Minutes Intravenous Daily 12/13/19 2358 12/14/19 0001   12/14/19 0030  remdesivir 100 mg in sodium chloride 0.9 % 100 mL IVPB       "Followed by" Linked Group Details   100 mg 200 mL/hr over 30 Minutes Intravenous Every 30 min 12/14/19 0002 12/14/19  0210   12/14/19 0000  remdesivir 200 mg in sodium chloride 0.9% 250 mL IVPB  Status:  Discontinued       "Followed by" Linked Group Details   200 mg 580 mL/hr over 30 Minutes Intravenous Once 12/13/19 2358 12/14/19 0001   12/13/19 2245  vancomycin (VANCOREADY) IVPB 2000 mg/400 mL        2,000 mg 200 mL/hr over 120 Minutes Intravenous  Once 12/13/19 2241 12/14/19 0210   12/13/19 2245  ceFEPIme (MAXIPIME) 2 g in sodium chloride 0.9 % 100 mL IVPB        2 g 200 mL/hr over 30 Minutes Intravenous  Once 12/13/19 2241 12/14/19 0002          Objective:   Vitals:   12/28/19 2012 12/28/19 2308 12/29/19 0304 12/29/19 0739  BP: 101/61 (!) 108/58 (!) 102/56 97/63  Pulse: 60 (!) 55 (!) 48 62  Resp: 16 18 15 15   Temp: 97.7 F (36.5 C) 97.6 F (36.4 C) 97.7 F (36.5 C) 98.1 F (36.7 C)  TempSrc: Oral Oral Oral Oral  SpO2: 95% 92% 92% 93%  Weight:   108.4 kg   Height:        SpO2: 93 % O2 Flow Rate (L/min): 1 L/min FiO2 (%): 36 %  Wt Readings from Last 3 Encounters:  12/29/19 108.4 kg  09/10/19 114.8 kg  08/13/19 114.8 kg     Intake/Output Summary (Last 24 hours) at 12/29/2019 1203 Last data filed at 12/28/2019 2115 Gross per 24 hour  Intake --  Output 300 ml  Net -300 ml     Physical Exam  Awake Alert, Oriented X 3, No new F.N deficits, Normal affect Symmetrical Chest wall movement, Good air movement bilaterally, CTAB RRR,No Gallops,Rubs or new Murmurs, No Parasternal Heave +ve B.Sounds, Abd Soft, No tenderness, No rebound - guarding or rigidity. No Cyanosis, Clubbing, trace edema bilaterally at baseline      Data Review:   Recent Labs  Lab 12/24/19 0130 12/24/19 0130 12/25/19 0112 12/26/19 0051 12/27/19 0310 12/28/19 0355 12/29/19 0439  WBC 4.6   < > 5.3 5.6 4.3 3.9* 3.4*  HGB 10.4*   < > 10.6* 11.1* 11.5* 11.2* 11.0*  HCT 32.1*   < > 31.7* 33.2* 35.7* 33.1* 33.3*  PLT 124*   < > 105* 103* 92* 86* 85*  MCV 87.0   < > 86.4 86.2 87.9 87.6 88.8  MCH 28.2    < > 28.9 28.8 28.3 29.6 29.3  MCHC 32.4   < > 33.4 33.4 32.2 33.8 33.0  RDW 14.8   < > 15.1 15.2 15.6* 16.1* 16.6*  LYMPHSABS 0.5*  --  0.6* 0.7 0.8  --   --   MONOABS 0.3  --  0.3 0.4 0.3  --   --   EOSABS 0.0  --  0.1 0.1 0.2  --   --   BASOSABS 0.0  --  0.0 0.0 0.0  --   --    < > = values in this interval not displayed.  Recent Labs  Lab 12/23/19 1127 12/23/19 1127 12/24/19 0130 12/24/19 0130 12/25/19 0112 12/26/19 0051 12/27/19 0310 12/28/19 0355 12/29/19 0439  NA 139   < > 141   < > 140 138 140 140 142  K 4.4   < > 4.3   < > 4.3 4.4 4.2 4.2 4.2  CL 108   < > 104   < > 105 105 103 104 105  CO2 24   < > 27   < > 27 27 28 29 29   GLUCOSE 100*   < > 123*   < > 114* 97 89 93 92  BUN 53*   < > 50*   < > 43* 36* 28* 26* 26*  CREATININE 1.27*   < > 1.11   < > 1.13 1.06 1.18 1.31* 1.31*  CALCIUM 7.7*   < > 7.6*   < > 7.9* 8.1* 8.3* 8.1* 8.0*  MG 2.5*  --  2.6*  --  2.4 2.1 1.9  --   --   TSH 0.059*  --   --   --   --   --   --   --   --   BNP  --   --  283.5*  --  354.2* 355.6* 440.7*  --   --    < > = values in this interval not displayed.    Recent Labs  Lab 12/24/19 0130 12/25/19 0112 12/26/19 0051 12/27/19 0310  BNP 283.5* 354.2* 355.6* 440.7*    ------------------------------------------------------------------------------------------------------------------ No results for input(s): CHOL, HDL, LDLCALC, TRIG, CHOLHDL, LDLDIRECT in the last 72 hours.  Lab Results  Component Value Date   HGBA1C 6.1 (H) 12/14/2019   ------------------------------------------------------------------------------------------------------------------ No results for input(s): TSH, T4TOTAL, T3FREE, THYROIDAB in the last 72 hours.  Invalid input(s): FREET3 ------------------------------------------------------------------------------------------------------------------ No results for input(s): VITAMINB12, FOLATE, FERRITIN, TIBC, IRON, RETICCTPCT in the last 72 hours.  Coagulation  profile No results for input(s): INR, PROTIME in the last 168 hours.  No results for input(s): DDIMER in the last 72 hours.  Cardiac Enzymes No results for input(s): CKMB, TROPONINI, MYOGLOBIN in the last 168 hours.  Invalid input(s): CK ------------------------------------------------------------------------------------------------------------------    Component Value Date/Time   BNP 440.7 (H) 12/27/2019 0310    Micro Results No results found for this or any previous visit (from the past 240 hour(s)).  Radiology Reports DG Chest Portable 1 View  Result Date: 12/13/2019 CLINICAL DATA:  Shortness of breath, suspicion of COVID EXAM: PORTABLE CHEST 1 VIEW COMPARISON:  Radiograph 01/07/2019, CT 11/20/2018 FINDINGS: Heterogeneous consolidative and hazy opacity is present in a lower lung and peripheral predominance most coalescent in the left lung periphery. Some more bandlike opacities, possibly subsegmental atelectatic changes or scarring. No pneumothorax. No visible effusion though the costophrenic sulci are partially collimated. Postsurgical changes related to prior CABG including intact and aligned sternotomy wires and multiple surgical clips projecting over the mediastinum. Telemetry leads overlie the chest. The aorta is calcified. The remaining cardiomediastinal contours are unremarkable. Degenerative changes are present in the imaged spine and shoulders. IMPRESSION: 1. Heterogeneous consolidative and hazy opacity in a lower lung and peripheral predominance most coalescent in the left lung periphery, compatible with a multifocal pneumonia including atypical viral etiology in the appropriate clinical setting. 2. More bandlike opacities likely reflect subsegmental atelectatic changes or scarring. Electronically Signed   By: Lovena Le M.D.   On: 12/13/2019 22:28   DG Foot 2 Views Left  Result Date: 12/28/2019 CLINICAL DATA:  Left foot  pain. EXAM: LEFT FOOT - 2 VIEW COMPARISON:  None.  FINDINGS: No fracture or bone lesion. Joints are normally spaced and aligned. No significant arthropathic change. Tiny plantar and small to moderate dorsal calcaneal spurs. Nonspecific forefoot soft tissue swelling. IMPRESSION: 1. No fracture, joint abnormality or bone lesion. 2. Nonspecific soft tissue swelling.  Calcaneal spurs. Electronically Signed   By: Lajean Manes M.D.   On: 12/28/2019 13:49   ECHOCARDIOGRAM COMPLETE  Result Date: 12/24/2019    ECHOCARDIOGRAM REPORT   Patient Name:   YEE JOSS Date of Exam: 12/24/2019 Medical Rec #:  [id number]          Height:       74.0 in Accession #:    [id number]         Weight:       247.6 lb Date of Birth:  [birth year]          BSA:          2.380 m Patient Age:    59 years           BP:           102/54 mmHg Patient Gender: M                  HR:           61 bpm. Exam Location:  Inpatient Procedure: 2D Echo, Cardiac Doppler and Color Doppler Indications:    A83.41 Chronic systolic (congestive) heart failure  History:        Patient has prior history of Echocardiogram examinations, most                 recent 04/18/2019. COVID-19 Positive.  Sonographer:    Jonelle Sidle Dance Referring Phys: [id number] Carlene Coria  Sonographer Comments: No subcostal window. IMPRESSIONS  1. Left ventricular ejection fraction, by estimation, is 45 to 50%. The left ventricle has mildly decreased function. The left ventricle demonstrates global hypokinesis. The left ventricular internal cavity size was mildly dilated. There is mild left ventricular hypertrophy. Left ventricular diastolic parameters are indeterminate.  2. Right ventricular systolic function is mildly reduced. The right ventricular size is normal.  3. Left atrial size was moderately dilated.  4. The mitral valve is grossly normal. Trivial mitral valve regurgitation. No evidence of mitral stenosis.  5. The aortic valve is grossly normal. Aortic valve regurgitation is trivial. No aortic stenosis is present.  Comparison(s): Prior images reviewed side by side. Changes from prior study are noted. Conclusion(s)/Recommendation(s): No hemodynamically significant valvular heart disease. LV function appears improved when directly compared to prior. FINDINGS  Left Ventricle: Left ventricular ejection fraction, by estimation, is 45 to 50%. The left ventricle has mildly decreased function. The left ventricle demonstrates global hypokinesis. The left ventricular internal cavity size was mildly dilated. There is  mild left ventricular hypertrophy. Left ventricular diastolic parameters are indeterminate. Right Ventricle: The right ventricular size is normal. Right vetricular wall thickness was not well visualized. Right ventricular systolic function is mildly reduced. Left Atrium: Left atrial size was moderately dilated. Right Atrium: Right atrial size was normal in size. Pericardium: There is no evidence of pericardial effusion. Mitral Valve: The mitral valve is grossly normal. Trivial mitral valve regurgitation. No evidence of mitral valve stenosis. Tricuspid Valve: The tricuspid valve is normal in structure. Tricuspid valve regurgitation is trivial. Aortic Valve: The aortic valve is grossly normal. Aortic valve regurgitation is trivial. Aortic regurgitation PHT measures 521 msec. No aortic stenosis is present. Pulmonic  Valve: The pulmonic valve was not well visualized. Pulmonic valve regurgitation is mild. No evidence of pulmonic stenosis. Aorta: The aortic root and ascending aorta are structurally normal, with no evidence of dilitation and the aortic arch was not well visualized. Venous: The inferior vena cava was not well visualized. IAS/Shunts: The atrial septum is grossly normal.  LEFT VENTRICLE PLAX 2D LVIDd:         5.65 cm  Diastology LVIDs:         4.13 cm  LV e' medial:    6.09 cm/s LV PW:         1.40 cm  LV E/e' medial:  11.8 LV IVS:        1.12 cm  LV e' lateral:   7.51 cm/s LVOT diam:     2.10 cm  LV E/e' lateral: 9.6  LV SV:         74 LV SV Index:   31 LVOT Area:     3.46 cm  RIGHT VENTRICLE RV Basal diam:  2.96 cm RV S prime:     7.40 cm/s TAPSE (M-mode): 1.4 cm LEFT ATRIUM              Index       RIGHT ATRIUM           Index LA diam:        5.50 cm  2.31 cm/m  RA Area:     13.80 cm LA Vol (A2C):   114.0 ml 47.90 ml/m RA Volume:   28.40 ml  11.93 ml/m LA Vol (A4C):   87.6 ml  36.81 ml/m LA Biplane Vol: 101.0 ml 42.44 ml/m  AORTIC VALVE LVOT Vmax:   85.00 cm/s LVOT Vmean:  60.800 cm/s LVOT VTI:    0.213 m AI PHT:      521 msec  AORTA Ao Root diam: 3.50 cm Ao Asc diam:  3.70 cm MITRAL VALVE MV Area (PHT): 2.54 cm    SHUNTS MV Decel Time: 299 msec    Systemic VTI:  0.21 m MV E velocity: 71.80 cm/s  Systemic Diam: 2.10 cm MV A velocity: 81.50 cm/s MV E/A ratio:  0.88 Buford Dresser MD Electronically signed by Buford Dresser MD Signature Date/Time: 12/24/2019/12:01:13 PM    Final    VAS [place] LOWER EXTREMITY VENOUS (DVT)  Result Date: 12/15/2019  Lower Venous DVT Study Indications: Elevated ddimer.  Comparison Study: 07/09/18 previous Performing Technologist: Abram Sander RVS  Examination Guidelines: A complete evaluation includes B-mode imaging, spectral Doppler, color Doppler, and power Doppler as needed of all accessible portions of each vessel. Bilateral testing is considered an integral part of a complete examination. Limited examinations for reoccurring indications may be performed as noted. The reflux portion of the exam is performed with the patient in reverse Trendelenburg.  +---------+---------------+---------+-----------+----------+--------------+ RIGHT    CompressibilityPhasicitySpontaneityPropertiesThrombus Aging +---------+---------------+---------+-----------+----------+--------------+ CFV      Full           Yes      Yes                                 +---------+---------------+---------+-----------+----------+--------------+ SFJ      Full                                                         +---------+---------------+---------+-----------+----------+--------------+  FV Prox  Full                                                        +---------+---------------+---------+-----------+----------+--------------+ FV Mid   Full                                                        +---------+---------------+---------+-----------+----------+--------------+ FV DistalFull                                                        +---------+---------------+---------+-----------+----------+--------------+ PFV      Full                                                        +---------+---------------+---------+-----------+----------+--------------+ POP      Full           Yes      Yes                                 +---------+---------------+---------+-----------+----------+--------------+ PTV      Full                                                        +---------+---------------+---------+-----------+----------+--------------+ PERO     Full                                                        +---------+---------------+---------+-----------+----------+--------------+   +---------+---------------+---------+-----------+----------+--------------+ LEFT     CompressibilityPhasicitySpontaneityPropertiesThrombus Aging +---------+---------------+---------+-----------+----------+--------------+ CFV      Full           Yes      Yes                                 +---------+---------------+---------+-----------+----------+--------------+ SFJ      Full                                                        +---------+---------------+---------+-----------+----------+--------------+ FV Prox  Full                                                        +---------+---------------+---------+-----------+----------+--------------+  FV Mid   Full                                                         +---------+---------------+---------+-----------+----------+--------------+ FV DistalFull                                                        +---------+---------------+---------+-----------+----------+--------------+ PFV      Full                                                        +---------+---------------+---------+-----------+----------+--------------+ POP      Full           Yes      Yes                                 +---------+---------------+---------+-----------+----------+--------------+ PTV      Full                                                        +---------+---------------+---------+-----------+----------+--------------+ PERO     Full                                                        +---------+---------------+---------+-----------+----------+--------------+     Summary: BILATERAL: - No evidence of deep vein thrombosis seen in the lower extremities, bilaterally. - No evidence of superficial venous thrombosis in the lower extremities, bilaterally. -No evidence of popliteal cyst, bilaterally. RIGHT: - Findings appear essentially unchanged compared to previous examination.   *See table(s) above for measurements and observations. Electronically signed by Deitra Mayo MD on 12/15/2019 at 6:22:41 PM.    Final     Phillips Climes M.D on 12/29/2019 at 12:03 PM  To page go to [email or web address]

## 2019-12-30 ENCOUNTER — Inpatient Hospital Stay (HOSPITAL_COMMUNITY): Payer: Self-pay

## 2019-12-30 ENCOUNTER — Other Ambulatory Visit: Payer: Self-pay | Admitting: Oncology

## 2019-12-30 DIAGNOSIS — R7401 Elevation of levels of liver transaminase levels: Secondary | ICD-10-CM

## 2019-12-30 DIAGNOSIS — I951 Orthostatic hypotension: Secondary | ICD-10-CM

## 2019-12-30 LAB — CBC
HCT: 35.1 % — ABNORMAL LOW (ref 39.0–52.0)
Hemoglobin: 11 g/dL — ABNORMAL LOW (ref 13.0–17.0)
MCH: 28.4 pg (ref 26.0–34.0)
MCHC: 31.3 g/dL (ref 30.0–36.0)
MCV: 90.5 fL (ref 80.0–100.0)
Platelets: 68 K/uL — ABNORMAL LOW (ref 150–400)
RBC: 3.88 MIL/uL — ABNORMAL LOW (ref 4.22–5.81)
RDW: 16.7 % — ABNORMAL HIGH (ref 11.5–15.5)
WBC: 3.7 K/uL — ABNORMAL LOW (ref 4.0–10.5)
nRBC: 0 % (ref 0.0–0.2)

## 2019-12-30 LAB — COMPREHENSIVE METABOLIC PANEL
ALT: 114 U/L — ABNORMAL HIGH (ref 0–44)
AST: 51 U/L — ABNORMAL HIGH (ref 15–41)
Albumin: 2.6 g/dL — ABNORMAL LOW (ref 3.5–5.0)
Alkaline Phosphatase: 36 U/L — ABNORMAL LOW (ref 38–126)
Anion gap: 6 (ref 5–15)
BUN: 25 mg/dL — ABNORMAL HIGH (ref 8–23)
CO2: 29 mmol/L (ref 22–32)
Calcium: 7.9 mg/dL — ABNORMAL LOW (ref 8.9–10.3)
Chloride: 106 mmol/L (ref 98–111)
Creatinine, Ser: 1.39 mg/dL — ABNORMAL HIGH (ref 0.61–1.24)
GFR, Estimated: 57 mL/min — ABNORMAL LOW (ref >60)
Glucose, Bld: 94 mg/dL (ref 70–99)
Potassium: 4.4 mmol/L (ref 3.5–5.1)
Sodium: 141 mmol/L (ref 135–145)
Total Bilirubin: 1.7 mg/dL — ABNORMAL HIGH (ref 0.3–1.2)
Total Protein: 4.9 g/dL — ABNORMAL LOW (ref 6.5–8.1)

## 2019-12-30 LAB — GLUCOSE, CAPILLARY
Glucose-Capillary: 100 mg/dL — ABNORMAL HIGH (ref 70–99)
Glucose-Capillary: 108 mg/dL — ABNORMAL HIGH (ref 70–99)
Glucose-Capillary: 94 mg/dL (ref 70–99)
Glucose-Capillary: 132 mg/dL — ABNORMAL HIGH (ref 70–99)

## 2019-12-30 LAB — DIC (DISSEMINATED INTRAVASCULAR COAGULATION)PANEL
D-Dimer, Quant: 1.02 ug/mL-FEU — ABNORMAL HIGH (ref 0.00–0.50)
Fibrinogen: 284 mg/dL (ref 210–475)
INR: 1.1 (ref 0.8–1.2)
Platelets: 70 10*3/uL — ABNORMAL LOW (ref 150–400)
Prothrombin Time: 13.7 seconds (ref 11.4–15.2)
Smear Review: NONE SEEN
aPTT: 25 seconds (ref 24–36)

## 2019-12-30 LAB — ACTH STIMULATION, 3 TIME POINTS
Cortisol, 30 Min: 19.1 ug/dL
Cortisol, 60 Min: 23.2 ug/dL
Cortisol, Base: 7.8 ug/dL

## 2019-12-30 LAB — BRAIN NATRIURETIC PEPTIDE: B Natriuretic Peptide: 502.9 pg/mL — ABNORMAL HIGH (ref 0.0–100.0)

## 2019-12-30 LAB — TECHNOLOGIST SMEAR REVIEW

## 2019-12-30 LAB — LACTATE DEHYDROGENASE: LDH: 172 U/L (ref 98–192)

## 2019-12-30 LAB — SAVE SMEAR(SSMR), FOR PROVIDER SLIDE REVIEW

## 2019-12-30 MED ORDER — MELATONIN 5 MG PO TABS
10.0000 mg | ORAL_TABLET | Freq: Every day | ORAL | Status: DC
  Administered 2019-12-30 – 2020-01-02 (×4): 10 mg via ORAL
  Filled 2019-12-30 (×6): qty 2

## 2019-12-30 NOTE — TOC Progression Note (Signed)
Transition of Care (TOC) - Progression Note  Marvetta Gibbons RN, BSN Transitions of Care Unit 4E- RN Case Manager See Treatment Team for direct phone # Cross coverage for 5W   Patient Details  Name: Frank Love MRN: [id number] Date of Birth: January 05, 1956  Transition of Care [facility]) CM/SW Contact  Dahlia Client, Romeo Rabon, RN Phone Number: 12/30/2019, 3:11 PM  Clinical Narrative:    Notified by 5W RN that pt no longer need home 02- patient had been set up with home 02 last week under charity with [employer]- call made to [employer] to have portable tanks pickup up and returned (RN to have tanks at nurses station)- [employer] to come pick tanks up this afternoon. Per bedside RN - confirmed pt has received RW for home.  Will try to see if [place] agency can take referral for HHRN/PT- call made to [facility] regarding referral- pending response.  Pt is active with [facility] for primary care.    Expected Discharge Plan: Home/Self Care Barriers to Discharge: Continued Medical Work up, Inadequate or no insurance  Expected Discharge Plan and Services Expected Discharge Plan: Home/Self Care   Discharge Planning Services: CM Consult, Medication Assistance     Expected Discharge Date: 12/23/19               DME Arranged: [name] rolling, Oxygen DME Agency: [employer] Date DME Agency Contacted: 12/22/19   Representative spoke with at DME Agency: [employer]e [employer]--charity             Social Determinants of Health ([place]) Interventions    Readmission Risk Interventions Readmission Risk Prevention Plan 12/21/2018  Transportation Screening (No Data)  Some recent data might be hidden

## 2019-12-30 NOTE — Progress Notes (Signed)
SATURATION QUALIFICATIONS: (This note is used to comply with regulatory documentation for home oxygen)  Patient Saturations on Room Air at Rest = 99%  Patient Saturations on Room Air while Ambulating = 99%    Patient no longer requires home oxygen.   [name], PT Pager [phone]

## 2019-12-30 NOTE — Progress Notes (Addendum)
Advanced Heart Failure Rounding Note  PCP-Cardiologist: [name], MD   Subjective:   SBP remains soft on midodrine 15 mg twice a day.  Having ongoing nausea.   Echo: LVEF 45-50% (improved from prior study, previously 35-40%). RV mildly reduced. No significant valvular disease. No pericardial effusion.     Objective:   Weight Range: 107.9 kg Body mass index is 30.54 kg/m.   Vital Signs:   Temp:  [97.7 F (36.5 C)-98 F (36.7 C)] 97.7 F (36.5 C) (12/06 0700) Pulse Rate:  [51-66] 56 (12/06 0700) Resp:  [17-20] 20 (12/06 0700) BP: (83-115)/(51-68) 106/59 (12/06 0700) SpO2:  [91 %-98 %] 98 % (12/06 0700) Weight:  [107.9 kg] 107.9 kg (12/06 0418) Last BM Date: 12/28/19  Weight change: Filed Weights   12/28/19 0402 12/29/19 0304 12/30/19 0418  Weight: 108.8 kg 108.4 kg 107.9 kg    Intake/Output:   Intake/Output Summary (Last 24 hours) at 12/30/2019 1125 Last data filed at 12/29/2019 1300 Gross per 24 hour  Intake 240 ml  Output 1000 ml  Net -760 ml      Physical Exam   Physical Exam per Dr Aundra Dubin.  General:  Well appearing. No resp difficulty HEENT: normal Neck: supple. no JVD. Carotids 2+ bilat; no bruits. No lymphadenopathy or thryomegaly appreciated. Cor: PMI nondisplaced. Regular rate & rhythm. No rubs, gallops or murmurs. Lungs: clear Abdomen: soft, nontender, nondistended. No hepatosplenomegaly. No bruits or masses. Good bowel sounds. Extremities: no cyanosis, clubbing, rash, R and LLE thigh high ted hose. edema Neuro: alert & orientedx3, cranial nerves grossly intact. moves all 4 extremities w/o difficulty. Affect pleasant   Telemetry   Sinus Brady 50s   EKG    No new EKG to review   Labs    CBC Recent Labs    12/29/19 0439 12/30/19 0348  WBC 3.4* 3.7*  HGB 11.0* 11.0*  HCT 33.3* 35.1*  MCV 88.8 90.5  PLT 85* 68*   Basic Metabolic Panel Recent Labs    12/29/19 0439 12/30/19 0348  NA 142 141  K 4.2 4.4  CL 105 106  CO2  29 29  GLUCOSE 92 94  BUN 26* 25*  CREATININE 1.31* 1.39*  CALCIUM 8.0* 7.9*   Liver Function Tests Recent Labs    12/30/19 0348  AST 51*  ALT 114*  ALKPHOS 36*  BILITOT 1.7*  PROT 4.9*  ALBUMIN 2.6*   No results for input(s): LIPASE, AMYLASE in the last 72 hours. Cardiac Enzymes No results for input(s): CKTOTAL, CKMB, CKMBINDEX, TROPONINI in the last 72 hours.  BNP: BNP (last 3 results) Recent Labs    12/26/19 0051 12/27/19 0310 12/30/19 0820  BNP 355.6* 440.7* 502.9*    ProBNP (last 3 results) No results for input(s): PROBNP in the last 8760 hours.   D-Dimer No results for input(s): DDIMER in the last 72 hours. Hemoglobin A1C No results for input(s): HGBA1C in the last 72 hours. Fasting Lipid Panel No results for input(s): CHOL, HDL, LDLCALC, TRIG, CHOLHDL, LDLDIRECT in the last 72 hours. Thyroid Function Tests No results for input(s): TSH, T4TOTAL, T3FREE, THYROIDAB in the last 72 hours.  Invalid input(s): FREET3  Other results:   Imaging    No results found.   Medications:     Scheduled Medications: . vitamin C  500 mg Oral Daily  . aspirin EC  81 mg Oral QHS  . atorvastatin  40 mg Oral q1800  . bisacodyl  10 mg Oral Daily  . cholecalciferol  1,000 Units Oral Daily  . feeding supplement (GLUCERNA SHAKE)  237 mL Oral TID BM  . folic acid  1 mg Oral Daily  . insulin aspart  0-9 Units Subcutaneous TID WC  . insulin glargine  6 Units Subcutaneous Daily  . melatonin  3 mg Oral QHS  . midodrine  15 mg Oral TID WC  . multivitamin with minerals  1 tablet Oral Daily  . polyethylene glycol  17 g Oral Daily  . thiamine  100 mg Oral Daily    Infusions:   PRN Medications: acetaminophen, Ipratropium-Albuterol, metoCLOPramide (REGLAN) injection, nitroGLYCERIN, ondansetron (ZOFRAN) IV, sodium chloride    Assessment/Plan   1.  Acute respiratory failure 2/2 COVID-19 Viral PNA -Per primary team - Received remdesivir x 5 days,steroid taper,  tocilizumab/baricitinib.  -Stable on room air.    2. Hypotension (hx of hypertension) - Received IVF bolus 11/29 and 12/2.    -Continue to hold home farxiga, dig, lasix, aldactone, entresto and coreg -Had similar episode post-CABG 11/2018 required NE and dopamine then transitioned to midodrine.   - c/w TED - c/w abdominal binder while out of bed - Continue midodrine to 15 mg TID   3. 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    4. CAD: S/p CABG in 11/20. He graduated cardiac rehab 6/21  - Lipid panel good 7/21 - Continue ASA 81 daily.    5. Chronic systolic CHF 2/2 Ischemic cardiomyopathy.   - Pre-CABG echo with EF 30-35%, mild RV dysfunction.  Post-CABG echo with EF 30-35%, severe RV systolic dysfunction.  V/Q scan done, no evidence for PE. Hypotensive post-CABG requiring pressors and then midodrine, but improved over time.  -Echo repeated 3/21, EF 35-40% with moderate RV dysfunction, GIIDD. Cardiac MRI was recommended to quantify EF more exactly for purposes of ICD determination, however pt was adamant that he did not want an ICD and declined cMRI.  - Echo repeated this admit. LVEF improved to 45-50%. RV mildly reduced  -Off GDMT with ongoing hypotension/bradycardia.     6. Carotid stenosis:  - dopplers 11/20 showed 40-59% Rt and 1-39% Lt ICA stenosis   - Repeat carotid dopplers study scheduled 01/14/2020.  - continue ASA and statin.    7. Acute on CKD -admission Cr 1.32, peak 1.51 on 11/22 - Baseline Cr around 1, 1.4 today.    8. Hyperkalemia - resolved  9. Thrombocytopenia -likely secondary to recent acute infection -On lovenox for VTE PPX   Length of Stay: Wiley Ford, NP  12/30/2019, 11:25 AM  Advanced Heart Failure Team Pager [phone] (M-F; 7a - 4p)  Please contact [facility] for night-coverage after hours (4p -7a ) and weekends on [email or web address]  Patient seen with NP, agree with the above note.   He remains  orthostatic.  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 He does not appear adrenally insufficient.  - Continue midodrine 15 mg tid.  - Continue abdominal binder.  - Continue graded compression.  - Droxidopa is not on hospital formulary, would avoid fludrocortisone with CHF.   Suspect that it is going to take time to see improvement. We will follow at a distance.   [name] 12/30/2019 12:01 PM

## 2019-12-30 NOTE — Progress Notes (Signed)
PROGRESS NOTE                                                                                                                                                                                                             Patient Demographics:    Frank Love, is a 64 y.o. male, DOB - 1955/06/22, NFA:[id number]  Admit date - 12/13/2019   Admitting Physician Kayleen Memos, DO  Outpatient Primary MD for the patient is [name], NP  LOS - 64  Chief Complaint  Patient presents with  . Covid Exposure  . Shortness of Breath  . generalized weakness       Brief Narrative  - 64 y.o.malewith medical history significant forchronic systolic CHF, ischemic cardiomyopathy with LVEF 35 to 40%, coronary artery disease status post CABG, penile cancer, carotid artery stenosis, who presented to [facility] via EMS after being found severely hypoxicathome.Unfortunately patient is unvaccinated against Covid, his work-up was significant for COVID-19 of pneumonia and severe hypoxia for which he is admitted by [facility].  Patient with significant oxygen requirement at one point on 30 L heated high flow nasal cannula, this has improved when he is currently on room air, but he remains with significant orthostatics with pressure dropped in the 60s upon standing for he H been followed with CHF, as well he did develop thrombocytopenia.   Subjective:   Patient this morning so far denies nausea, but reports he had nausea later and today his morning denies any nausea, but he is still significantly symptomatic with his orthostasis upon standing.   Assessment  & Plan :     Acute hypoxic respiratory failure due to COVID-19 pneumonia.   - He is unvaccinated and incurred severe parenchymal lung injur. -Initially requiring 30 L heated high flow nasal cannula, this has significantly improved, he is currently on room air. -He was treated with steroids, Remdesivir and Actemra. -He was encouraged use incentive  spirometry and flutter valve.    Recent Labs  Lab 12/24/19 0130 12/24/19 0130 12/25/19 0112 12/25/19 0112 12/26/19 0051 12/27/19 0310 12/28/19 0355 12/29/19 0439 12/30/19 0348 12/30/19 0820  WBC 4.6   < > 5.3   < > 5.6 4.3 3.9* 3.4* 3.7*  --   HGB 10.4*   < > 10.6*   < > 11.1* 11.5* 11.2* 11.0* 11.0*  --   HCT 32.1*   < > 31.7*   < > 33.2* 35.7* 33.1* 33.3* 35.1*  --   PLT 124*   < >  105*   < > 103* 92* 86* 85* 68*  --   BNP 283.5*  --  354.2*  --  355.6* 440.7*  --   --   --  502.9*  AST  --   --   --   --   --   --   --   --  51*  --   ALT  --   --   --   --   --   --   --   --  114*  --   ALKPHOS  --   --   --   --   --   --   --   --  36*  --   BILITOT  --   --   --   --   --   --   --   --  1.7*  --   ALBUMIN  --   --   --   --   --   --   --   --  2.6*  --    < > = values in this interval not displayed.     Chronic combined systolic and diastolic heart failure -  recent echocardiogram shows EF of 35%.  Repeat 2D echo during hospital stay showing improved EF 45%.   -Management by CHF team, cardiology . -Home medication including Coreg, Wilder Glade, Entresto and digoxin on hold given low blood pressure and bradycardia .  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 -He remains significantly orthostatic despite being on maximum dose midodrine 15 mg 3 times daily, and being compliant with his TED hose and abdominal binder, and received fluid boluses as well, all his antihypertensive and diuretic regimen remains on hold. -No evidence of adrenal  insufficiency as he had good response to cosyntropin challenge test .  Transaminitis -We will check ultrasound abdomen  Thrombocytopenia -No clear etiology, I will stop his Lovenox, check HIT, will obtain DIC.  LDH and haptoglobin.  Will check abdominal ultrasound to rule out splenomegaly as well.  Dyslipidemia.  On statin.  CAD  s/p CABG.  On aspirin and statin combination, blockers has been stopped due to bradycardia and orthostasis .  Hyperkalemia.  Treated will monitor.  Mildly suppressed TSH.  Free T4 slightly high.  Since he is bradycardic and hypotensive I do not think he is a exhibiting any signs of hyperthyroidism, will request to repeat TSH, T3 and free T4 in 4 weeks by PCP.  Left foot pain has significantly improved, left foot x-ray with no acute findings.   Condition - Extremely Guarded  Family Communication  : Discussed with patient in details, answered all his questions.  Daughter updated by phone 12/2  Code Status :  Full  Consults  :  Cards  Procedures  :    TTE  Leg [place] - No DVT  PUD Prophylaxis : PPI  Disposition Plan  :    Status is: Inpatient  Remains inpatient appropriate because:IV treatments appropriate due to intensity of illness or inability to take PO   Dispo:  Patient From:    Planned Disposition:    Expected discharge date: 12/31/19  Medically stable for discharge:     DVT Prophylaxis  :  Lovenox be stopped given thrombocytopenia and he will be started on SCD   Lab Results  Component Value Date   PLT 68 (L) 12/30/2019    Diet :  Diet Order            Diet regular Room service appropriate? Yes; Fluid consistency: Thin  Diet effective now                  Inpatient Medications Scheduled Meds: . vitamin C  500 mg Oral Daily  . aspirin EC  81 mg Oral QHS  . atorvastatin  40 mg Oral q1800  . bisacodyl  10 mg Oral Daily  . cholecalciferol  1,000 Units Oral Daily  . feeding supplement (GLUCERNA SHAKE)  237 mL Oral TID BM  . folic acid  1 mg Oral Daily  . insulin aspart  0-9 Units Subcutaneous TID WC  . insulin glargine  6 Units Subcutaneous Daily  . melatonin  3 mg Oral QHS  . midodrine  15 mg Oral TID WC  . multivitamin with minerals  1 tablet Oral Daily  . polyethylene glycol  17 g Oral Daily  . thiamine  100 mg Oral Daily   Continuous Infusions:   PRN Meds:.acetaminophen, Ipratropium-Albuterol, metoCLOPramide (REGLAN) injection, nitroGLYCERIN, ondansetron (ZOFRAN) IV, sodium chloride  Antibiotics  :   Anti-infectives (From admission, onward)   Start     Dose/Rate Route Frequency Ordered Stop   12/15/19 1000  remdesivir 100 mg in sodium chloride 0.9 % 100 mL IVPB       "Followed by" Linked Group Details   100 mg 200 mL/hr over 30 Minutes Intravenous Daily 12/14/19 0002 12/18/19 0849   12/14/19 1000  remdesivir 100 mg in sodium chloride 0.9 % 100 mL IVPB  Status:  Discontinued       "Followed by" Linked Group Details   100 mg 200 mL/hr over 30 Minutes Intravenous Daily 12/13/19 2358 12/14/19 0001   12/14/19 0030  remdesivir 100 mg in sodium chloride 0.9 % 100 mL IVPB       "Followed by" Linked Group Details   100 mg 200 mL/hr over 30 Minutes Intravenous Every 30 min 12/14/19 0002 12/14/19 0210   12/14/19 0000  remdesivir 200 mg in sodium chloride 0.9% 250 mL IVPB  Status:  Discontinued       "Followed by" Linked Group Details   200 mg 580 mL/hr over 30 Minutes Intravenous Once 12/13/19 2358 12/14/19 0001   12/13/19 2245  vancomycin (VANCOREADY) IVPB 2000 mg/400 mL        2,000 mg 200 mL/hr over 120 Minutes Intravenous  Once 12/13/19 2241 12/14/19 0210   12/13/19 2245  ceFEPIme (MAXIPIME) 2 g in sodium chloride 0.9 % 100 mL IVPB        2 g 200 mL/hr over 30 Minutes Intravenous  Once 12/13/19 2241 12/14/19 0002          Objective:   Vitals:   12/29/19 2352 12/30/19 0400 12/30/19 0418 12/30/19 0700  BP: (!) 92/53 (!) 93/51  (!) 106/59  Pulse: (!) 51 (!) 52  (!) 56  Resp: 18 17  20   Temp: 97.9 F (36.6 C) 97.9 F (36.6 C)  97.7 F (36.5 C)  TempSrc: Oral Oral  Oral  SpO2: 91% 95%  98%  Weight:   107.9 kg   Height:        SpO2: 98 % O2 Flow Rate (L/min): 1 L/min FiO2 (%): 36 %  Wt Readings from Last 3 Encounters:  12/30/19 107.9 kg  09/10/19 114.8 kg  08/13/19 114.8 kg     Intake/Output Summary (Last 24  hours) at 12/30/2019 1023 Last data filed at 12/29/2019 1300  Gross per 24 hour  Intake 240 ml  Output 1000 ml  Net -760 ml     Physical Exam  Awake Alert, Oriented X 3, No new F.N deficits, Normal affect Symmetrical Chest wall movement, Good air movement bilaterally, CTAB RRR,No Gallops,Rubs or new Murmurs, No Parasternal Heave +ve B.Sounds, Abd Soft, No tenderness, No rebound - guarding or rigidity. No Cyanosis, Clubbing, trace edema bilaterally at baseline      Data Review:   Recent Labs  Lab 12/24/19 0130 12/24/19 0130 12/25/19 0112 12/25/19 0112 12/26/19 0051 12/27/19 0310 12/28/19 0355 12/29/19 0439 12/30/19 0348  WBC 4.6   < > 5.3   < > 5.6 4.3 3.9* 3.4* 3.7*  HGB 10.4*   < > 10.6*   < > 11.1* 11.5* 11.2* 11.0* 11.0*  HCT 32.1*   < > 31.7*   < > 33.2* 35.7* 33.1* 33.3* 35.1*  PLT 124*   < > 105*   < > 103* 92* 86* 85* 68*  MCV 87.0   < > 86.4   < > 86.2 87.9 87.6 88.8 90.5  MCH 28.2   < > 28.9   < > 28.8 28.3 29.6 29.3 28.4  MCHC 32.4   < > 33.4   < > 33.4 32.2 33.8 33.0 31.3  RDW 14.8   < > 15.1   < > 15.2 15.6* 16.1* 16.6* 16.7*  LYMPHSABS 0.5*  --  0.6*  --  0.7 0.8  --   --   --   MONOABS 0.3  --  0.3  --  0.4 0.3  --   --   --   EOSABS 0.0  --  0.1  --  0.1 0.2  --   --   --   BASOSABS 0.0  --  0.0  --  0.0 0.0  --   --   --    < > = values in this interval not displayed.    Recent Labs  Lab 12/23/19 1127 12/23/19 1127 12/24/19 0130 12/24/19 0130 12/25/19 0112 12/25/19 0112 12/26/19 0051 12/27/19 0310 12/28/19 0355 12/29/19 0439 12/30/19 0348 12/30/19 0820  NA 139   < > 141   < > 140   < > 138 140 140 142 141  --   K 4.4   < > 4.3   < > 4.3   < > 4.4 4.2 4.2 4.2 4.4  --   CL 108   < > 104   < > 105   < > 105 103 104 105 106  --   CO2 24   < > 27   < > 27   < > 27 28 29 29 29   --   GLUCOSE 100*   < > 123*   < > 114*   < > 97 89 93 92 94  --   BUN 53*   < > 50*   < > 43*   < > 36* 28* 26* 26* 25*  --   CREATININE 1.27*   < > 1.11   < > 1.13    < > 1.06 1.18 1.31* 1.31* 1.39*  --   CALCIUM 7.7*   < > 7.6*   < > 7.9*   < > 8.1* 8.3* 8.1* 8.0* 7.9*  --   AST  --   --   --   --   --   --   --   --   --   --  51*  --  ALT  --   --   --   --   --   --   --   --   --   --  114*  --   ALKPHOS  --   --   --   --   --   --   --   --   --   --  36*  --   BILITOT  --   --   --   --   --   --   --   --   --   --  1.7*  --   ALBUMIN  --   --   --   --   --   --   --   --   --   --  2.6*  --   MG 2.5*  --  2.6*  --  2.4  --  2.1 1.9  --   --   --   --   TSH 0.059*  --   --   --   --   --   --   --   --   --   --   --   BNP  --   --  283.5*  --  354.2*  --  355.6* 440.7*  --   --   --  502.9*   < > = values in this interval not displayed.    Recent Labs  Lab 12/24/19 0130 12/25/19 0112 12/26/19 0051 12/27/19 0310 12/30/19 0820  BNP 283.5* 354.2* 355.6* 440.7* 502.9*    ------------------------------------------------------------------------------------------------------------------ No results for input(s): CHOL, HDL, LDLCALC, TRIG, CHOLHDL, LDLDIRECT in the last 72 hours.  Lab Results  Component Value Date   HGBA1C 6.1 (H) 12/14/2019   ------------------------------------------------------------------------------------------------------------------ No results for input(s): TSH, T4TOTAL, T3FREE, THYROIDAB in the last 72 hours.  Invalid input(s): FREET3 ------------------------------------------------------------------------------------------------------------------ No results for input(s): VITAMINB12, FOLATE, FERRITIN, TIBC, IRON, RETICCTPCT in the last 72 hours.  Coagulation profile No results for input(s): INR, PROTIME in the last 168 hours.  No results for input(s): DDIMER in the last 72 hours.  Cardiac Enzymes No results for input(s): CKMB, TROPONINI, MYOGLOBIN in the last 168 hours.  Invalid input(s): CK ------------------------------------------------------------------------------------------------------------------     Component Value Date/Time   BNP 502.9 (H) 12/30/2019 0820    Micro Results No results found for this or any previous visit (from the past 240 hour(s)).  Radiology Reports DG Chest Portable 1 View  Result Date: 12/13/2019 CLINICAL DATA:  Shortness of breath, suspicion of COVID EXAM: PORTABLE CHEST 1 VIEW COMPARISON:  Radiograph 01/07/2019, CT 11/20/2018 FINDINGS: Heterogeneous consolidative and hazy opacity is present in a lower lung and peripheral predominance most coalescent in the left lung periphery. Some more bandlike opacities, possibly subsegmental atelectatic changes or scarring. No pneumothorax. No visible effusion though the costophrenic sulci are partially collimated. Postsurgical changes related to prior CABG including intact and aligned sternotomy wires and multiple surgical clips projecting over the mediastinum. Telemetry leads overlie the chest. The aorta is calcified. The remaining cardiomediastinal contours are unremarkable. Degenerative changes are present in the imaged spine and shoulders. IMPRESSION: 1. Heterogeneous consolidative and hazy opacity in a lower lung and peripheral predominance most coalescent in the left lung periphery, compatible with a multifocal pneumonia including atypical viral etiology in the appropriate clinical setting. 2. More bandlike opacities likely reflect subsegmental atelectatic changes or scarring. Electronically Signed   By: Lovena Le M.D.   On: 12/13/2019 22:28   DG  Foot 2 Views Left  Result Date: 12/28/2019 CLINICAL DATA:  Left foot pain. EXAM: LEFT FOOT - 2 VIEW COMPARISON:  None. FINDINGS: No fracture or bone lesion. Joints are normally spaced and aligned. No significant arthropathic change. Tiny plantar and small to moderate dorsal calcaneal spurs. Nonspecific forefoot soft tissue swelling. IMPRESSION: 1. No fracture, joint abnormality or bone lesion. 2. Nonspecific soft tissue swelling.  Calcaneal spurs. Electronically Signed   By: Lajean Manes M.D.   On: 12/28/2019 13:49   ECHOCARDIOGRAM COMPLETE  Result Date: 12/24/2019    ECHOCARDIOGRAM REPORT   Patient Name:   Frank Love Date of Exam: 12/24/2019 Medical Rec #:  [id number]          Height:       74.0 in Accession #:    [id number]         Weight:       247.6 lb Date of Birth:  1955/09/03          BSA:          2.380 m Patient Age:    65 years           BP:           102/54 mmHg Patient Gender: M                  HR:           61 bpm. Exam Location:  Inpatient Procedure: 2D Echo, Cardiac Doppler and Color Doppler Indications:    Z66.29 Chronic systolic (congestive) heart failure  History:        Patient has prior history of Echocardiogram examinations, most                 recent 04/18/2019. COVID-19 Positive.  Sonographer:    Jonelle Sidle Dance Referring Phys: [id number] Carlene Coria  Sonographer Comments: No subcostal window. IMPRESSIONS  1. Left ventricular ejection fraction, by estimation, is 45 to 50%. The left ventricle has mildly decreased function. The left ventricle demonstrates global hypokinesis. The left ventricular internal cavity size was mildly dilated. There is mild left ventricular hypertrophy. Left ventricular diastolic parameters are indeterminate.  2. Right ventricular systolic function is mildly reduced. The right ventricular size is normal.  3. Left atrial size was moderately dilated.  4. The mitral valve is grossly normal. Trivial mitral valve regurgitation. No evidence of mitral stenosis.  5. The aortic valve is grossly normal. Aortic valve regurgitation is trivial. No aortic stenosis is present. Comparison(s): Prior images reviewed side by side. Changes from prior study are noted. Conclusion(s)/Recommendation(s): No hemodynamically significant valvular heart disease. LV function appears improved when directly compared to prior. FINDINGS  Left Ventricle: Left ventricular ejection fraction, by estimation, is 45 to 50%. The left ventricle has mildly decreased function.  The left ventricle demonstrates global hypokinesis. The left ventricular internal cavity size was mildly dilated. There is  mild left ventricular hypertrophy. Left ventricular diastolic parameters are indeterminate. Right Ventricle: The right ventricular size is normal. Right vetricular wall thickness was not well visualized. Right ventricular systolic function is mildly reduced. Left Atrium: Left atrial size was moderately dilated. Right Atrium: Right atrial size was normal in size. Pericardium: There is no evidence of pericardial effusion. Mitral Valve: The mitral valve is grossly normal. Trivial mitral valve regurgitation. No evidence of mitral valve stenosis. Tricuspid Valve: The tricuspid valve is normal in structure. Tricuspid valve regurgitation is trivial. Aortic Valve: The aortic valve is grossly normal. Aortic valve regurgitation is  trivial. Aortic regurgitation PHT measures 521 msec. No aortic stenosis is present. Pulmonic Valve: The pulmonic valve was not well visualized. Pulmonic valve regurgitation is mild. No evidence of pulmonic stenosis. Aorta: The aortic root and ascending aorta are structurally normal, with no evidence of dilitation and the aortic arch was not well visualized. Venous: The inferior vena cava was not well visualized. IAS/Shunts: The atrial septum is grossly normal.  LEFT VENTRICLE PLAX 2D LVIDd:         5.65 cm  Diastology LVIDs:         4.13 cm  LV e' medial:    6.09 cm/s LV PW:         1.40 cm  LV E/e' medial:  11.8 LV IVS:        1.12 cm  LV e' lateral:   7.51 cm/s LVOT diam:     2.10 cm  LV E/e' lateral: 9.6 LV SV:         74 LV SV Index:   31 LVOT Area:     3.46 cm  RIGHT VENTRICLE RV Basal diam:  2.96 cm RV S prime:     7.40 cm/s TAPSE (M-mode): 1.4 cm LEFT ATRIUM              Index       RIGHT ATRIUM           Index LA diam:        5.50 cm  2.31 cm/m  RA Area:     13.80 cm LA Vol (A2C):   114.0 ml 47.90 ml/m RA Volume:   28.40 ml  11.93 ml/m LA Vol (A4C):   87.6 ml   36.81 ml/m LA Biplane Vol: 101.0 ml 42.44 ml/m  AORTIC VALVE LVOT Vmax:   85.00 cm/s LVOT Vmean:  60.800 cm/s LVOT VTI:    0.213 m AI PHT:      521 msec  AORTA Ao Root diam: 3.50 cm Ao Asc diam:  3.70 cm MITRAL VALVE MV Area (PHT): 2.54 cm    SHUNTS MV Decel Time: 299 msec    Systemic VTI:  0.21 m MV E velocity: 71.80 cm/s  Systemic Diam: 2.10 cm MV A velocity: 81.50 cm/s MV E/A ratio:  0.88 Buford Dresser MD Electronically signed by Buford Dresser MD Signature Date/Time: 12/24/2019/12:01:13 PM    Final    VAS [place] LOWER EXTREMITY VENOUS (DVT)  Result Date: 12/15/2019  Lower Venous DVT Study Indications: Elevated ddimer.  Comparison Study: 07/09/18 previous Performing Technologist: Abram Sander RVS  Examination Guidelines: A complete evaluation includes B-mode imaging, spectral Doppler, color Doppler, and power Doppler as needed of all accessible portions of each vessel. Bilateral testing is considered an integral part of a complete examination. Limited examinations for reoccurring indications may be performed as noted. The reflux portion of the exam is performed with the patient in reverse Trendelenburg.  +---------+---------------+---------+-----------+----------+--------------+ RIGHT    CompressibilityPhasicitySpontaneityPropertiesThrombus Aging +---------+---------------+---------+-----------+----------+--------------+ CFV      Full           Yes      Yes                                 +---------+---------------+---------+-----------+----------+--------------+ SFJ      Full                                                        +---------+---------------+---------+-----------+----------+--------------+  FV Prox  Full                                                        +---------+---------------+---------+-----------+----------+--------------+ FV Mid   Full                                                         +---------+---------------+---------+-----------+----------+--------------+ FV DistalFull                                                        +---------+---------------+---------+-----------+----------+--------------+ PFV      Full                                                        +---------+---------------+---------+-----------+----------+--------------+ POP      Full           Yes      Yes                                 +---------+---------------+---------+-----------+----------+--------------+ PTV      Full                                                        +---------+---------------+---------+-----------+----------+--------------+ PERO     Full                                                        +---------+---------------+---------+-----------+----------+--------------+   +---------+---------------+---------+-----------+----------+--------------+ LEFT     CompressibilityPhasicitySpontaneityPropertiesThrombus Aging +---------+---------------+---------+-----------+----------+--------------+ CFV      Full           Yes      Yes                                 +---------+---------------+---------+-----------+----------+--------------+ SFJ      Full                                                        +---------+---------------+---------+-----------+----------+--------------+ FV Prox  Full                                                        +---------+---------------+---------+-----------+----------+--------------+  FV Mid   Full                                                        +---------+---------------+---------+-----------+----------+--------------+ FV DistalFull                                                        +---------+---------------+---------+-----------+----------+--------------+ PFV      Full                                                         +---------+---------------+---------+-----------+----------+--------------+ POP      Full           Yes      Yes                                 +---------+---------------+---------+-----------+----------+--------------+ PTV      Full                                                        +---------+---------------+---------+-----------+----------+--------------+ PERO     Full                                                        +---------+---------------+---------+-----------+----------+--------------+     Summary: BILATERAL: - No evidence of deep vein thrombosis seen in the lower extremities, bilaterally. - No evidence of superficial venous thrombosis in the lower extremities, bilaterally. -No evidence of popliteal cyst, bilaterally. RIGHT: - Findings appear essentially unchanged compared to previous examination.   *See table(s) above for measurements and observations. Electronically signed by Deitra Mayo MD on 12/15/2019 at 6:22:41 PM.    Final     Phillips Climes M.D on 12/30/2019 at 10:23 AM  To page go to [email or web address]

## 2019-12-30 NOTE — Progress Notes (Signed)
Physical Therapy Treatment Note  Clinical impression: Patient able to progress to ambulation after multiple rounds of seated and standing exercises to increase his BP. Initial BPs low and pt symptomatic (see BPs below). As BP progressively increased, he was less symptomatic. Concerned that pt will not be able to continue this level of exercise each time he needs to stand and walk somewhere at home. Will see again tomorrow to determine if he can safely progress to home with [facility] or if he is approaching 20 days post-COVID diagnosis and might be eligible for CIR.   Sitting prior to activity 86/56; HR 84 Sitting after 3 exercisees 88/60, HR 87 Sitting after sit - stand/marching 87/57 Sitting after short distance ambulation of 10 ft 94/63 Sitting after ambulating in hallway @ 80 ft 100/70 Sitting after ambulating back to room @ 80 ft 92/64; HR 73   12/30/19 1422  PT Visit Information  Last PT Received On 12/30/19  Assistance Needed +1  PT/OT/SLP Co-Evaluation/Treatment Yes  Reason for Co-Treatment Complexity of the patient's impairments (multi-system involvement);For patient/therapist safety  PT goals addressed during session Mobility/safety with mobility;Proper use of DME;Strengthening/ROM  History of Present Illness 64 y.o. male with medical history significant for chronic systolic CHF, ischemic cardiomyopathy with LVEF 35 to 40%, coronary artery disease status post CABG, penile cancer, carotid artery stenosis, who presented to [facility] via EMS after being found severely hypoxic at home. In the ED, he required nonrebreather and high flow nasal cannula to maintain O2 saturation greater than 92%.  Work-up in the ED revealed severe acute hypoxic respiratory failure secondary to COVID-19 viral pneumonia. Unvaccinated. Admitted 12/13/19  Subjective Data  Patient Stated Goal to go home  Precautions  Precautions Other (comment)  Precaution Comments orthostasis  Pain Assessment  Pain Assessment Faces  Faces  Pain Scale 0  Cognition  Arousal/Alertness Awake/alert  Behavior During Therapy Flat affect  Overall Cognitive Status Impaired/Different from baseline  Area of Impairment Awareness;Safety/judgement;Problem solving  Current Attention Level Selective  Following Commands Follows multi-step commands inconsistently  Safety/Judgement Decreased awareness of safety  Awareness Emergent  Problem Solving Slow processing;Difficulty sequencing;Requires verbal cues;Requires tactile cues  General Comments slow processing but improved from last visit  Bed Mobility  Overal bed mobility Needs Assistance  Bed Mobility Supine to Sit;Sit to Supine  Supine to sit Supervision  Sit to supine Supervision  General bed mobility comments OOB in chair  Transfers  Overall transfer level Needs assistance  Equipment used Rolling walker (2 wheeled)  Transfers Sit to/from Stand  Sit to Stand Min guard  Stand pivot transfers Min guard  General transfer comment pt. completed marching in place approx. 30 seconds each time x2.  also took 5 steps forward and backwards but states he prefered marching in place for now  Ambulation/Gait  Ambulation/Gait assistance Min assist  Gait Distance (Feet) 100 Feet (initially 10, 30,  80, 100 seated rests and chair follow)  Assistive device Rolling walker (2 wheeled)  Gait Pattern/deviations Step-through pattern;Decreased stride length;Trunk flexed  General Gait Details after multiple rounds of seated and standing exercises to incr BP prior to ambulation  Gait velocity decr  Balance  Overall balance assessment Needs assistance  Sitting balance-Leahy Scale Good  Standing balance support Bilateral upper extremity supported  Standing balance-Leahy Scale Poor  Standing balance comment reliant on BUE support  Exercises  Exercises Other exercises  Other Exercises  Other Exercises UE counterpressure maneuvers 3-5 sec x 10 reps x 3 sets; crossed legs counterpressure; resisted  trunk/hip  flexion alternating with resisted trunk/hip extension  Other Exercises seated marching; heel raises x 20 3 trials  Other Exercises standing marching  PT - End of Session  Equipment Utilized During Treatment Gait belt  Activity Tolerance Treatment limited secondary to medical complications (Comment) (symptomatic hypotension)  Patient left with call bell/phone within reach;in chair  Nurse Communication Other (comment) (needs exercises and sit to stands prior to walking)   PT - Assessment/Plan  PT Plan Current plan remains appropriate (if orthostasis can be managed)  PT Visit Diagnosis Other abnormalities of gait and mobility (R26.89);Muscle weakness (generalized) (M62.81);Difficulty in walking, not elsewhere classified (R26.2)  PT Frequency (ACUTE ONLY) Min 3X/week  Follow Up Recommendations Home health PT;Supervision for mobility/OOB (if orthostasis persists, may need inpatient rehab/SNF )  PT equipment Rolling walker with 5" wheels  AM-PAC PT "6 Clicks" Mobility Outcome Measure (Version 2)  Help needed turning from your back to your side while in a flat bed without using bedrails? 3  Help needed moving from lying on your back to sitting on the side of a flat bed without using bedrails? 3  Help needed moving to and from a bed to a chair (including a wheelchair)? 3  Help needed standing up from a chair using your arms (e.g., wheelchair or bedside chair)? 3  Help needed to walk in hospital room? 2  Help needed climbing 3-5 steps with a railing?  1  6 Click Score 15  Consider Recommendation of Discharge To: CIR/SNF/LTACH  PT Goal Progression  Progress towards PT goals Progressing toward goals (goals updated due to timeframe)  Acute Rehab PT Goals  PT Goal Formulation With patient  Time For Goal Achievement 01/13/20  Potential to Achieve Goals Good  PT Time Calculation  PT Start Time (ACUTE ONLY) 1405  PT Stop Time (ACUTE ONLY) 1442  PT Time Calculation (min) (ACUTE ONLY) 37  min  PT General Charges  $$ ACUTE PT VISIT 1 Visit  PT Treatments  $Gait Training 8-22 mins     [name], PT Pager [phone]

## 2019-12-30 NOTE — Progress Notes (Signed)
Occupational Therapy Treatment Patient Details Name: Frank Love MRN: [id number] DOB: 19-Feb-1955 Today's Date: 12/30/2019    History of present illness 64 y.o. male with medical history significant for chronic systolic CHF, ischemic cardiomyopathy with LVEF 35 to 40%, coronary artery disease status post CABG, penile cancer, carotid artery stenosis, who presented to [facility] via EMS after being found severely hypoxic at home. In the ED, he required nonrebreather and high flow nasal cannula to maintain O2 saturation greater than 92%.  Work-up in the ED revealed severe acute hypoxic respiratory failure secondary to COVID-19 viral pneumonia. Unvaccinated. Admitted 12/13/19   OT comments  Pt seen as cotreat with PT this session for safety to increase functional mobility given orthostatics. Pt completed seated exercise, including counter pressure exercise, followed by ambulation. Able to ambulate in hallway with 1 seated rest break with SpO2 @ 97 on RA and BP maintaining (see below) without complaints of dizziness. Pt encouraged to continue with exercises and ambulate with nursing @ RW level this evening. Encourage pt to do as much of his ADL tasks as possible but to call nursing to S transfers given his orthostasis. Pt verbalized understanding.REcommend follow up with [place].  Sitting prior to activity 86/56; HR 84 Sitting after 3 exercisees 88/60 Sitting after sit - stand/marching 87/57 Sitting after short distance ambulation of 10 ft 94/63 Sitting after ambulating in hallway @ 80 ft 100/70 Sitting after ambulating back to room @ 80 ft 92/64; HR 73  Follow Up Recommendations  Home health OT;Supervision/Assistance - 24 hour [facility])    Equipment Recommendations  3 in 1 bedside commode    Recommendations for Other Services      Precautions / Restrictions Precautions Precautions: Other (comment) Precaution Comments: orthostasis       Mobility Bed Mobility Overal bed mobility: Needs  Assistance Bed Mobility: Supine to Sit;Sit to Supine     Supine to sit: Supervision Sit to supine: Supervision   General bed mobility comments: OOB in chair  Transfers Overall transfer level: Needs assistance Equipment used: Rolling walker (2 wheeled) Transfers: Sit to/from Stand Sit to Stand: Supervision          Balance Overall balance assessment: Needs assistance   Sitting balance-Leahy Scale: Good     Standing balance support: Bilateral upper extremity supported Standing balance-Leahy Scale: Poor Standing balance comment: reliant on BUE support                           ADL either performed or assessed with clinical judgement   ADL                                         General ADL Comments: Pt states he is completing his bath with set up from nursing; nurse washes his back     Vision       Perception     Praxis      Cognition Arousal/Alertness: Awake/alert Behavior During Therapy: Flat affect Overall Cognitive Status: Impaired/Different from baseline Area of Impairment: Awareness;Safety/judgement;Problem solving                   Current Attention Level: Selective   Following Commands: Follows multi-step commands inconsistently Safety/Judgement: Decreased awareness of safety Awareness: Emergent Problem Solving: Slow processing;Difficulty sequencing;Requires verbal cues;Requires tactile cues General Comments: decreaed safety awarness  Exercises Exercises: Other exercises Other Exercises Other Exercises: Standing marching x 3 trials.  Other Exercises: seated marching; heel raises x 20 3 trials Other Exercises: counterpressure exercises with BUE;  and trunk Other Exercises:   Shoulder Instructions       General Comments  Pt reports feeling better after ambulation    Pertinent Vitals/ Pain       Pain Assessment: Faces Faces Pain Scale: No hurt  Home Living                                           Prior Functioning/Environment              Frequency  Min 3X/week        Progress Toward Goals  OT Goals(current goals can now be found in the care plan section)  Progress towards OT goals: Progressing toward goals (goals updated)  Acute Rehab OT Goals Patient Stated Goal: to go home OT Goal Formulation: With patient Time For Goal Achievement: 12/30/19 Potential to Achieve Goals: Good ADL Goals Pt Will Perform Lower Body Bathing: with modified independence;sit to/from stand Pt Will Perform Lower Body Dressing: with modified independence;sit to/from stand Pt Will Transfer to Toilet: with modified independence;ambulating Pt Will Perform Toileting - Clothing Manipulation and hygiene: with modified independence;sit to/from stand Additional ADL Goal #1: Pt will independently verbalize 3 energy conservation precautions Additional ADL Goal #2: Pt will independently demonstrate pursed lip breathing techniques  Plan Discharge plan remains appropriate;Frequency needs to be updated    Co-evaluation    PT/OT/SLP Co-Evaluation/Treatment: Yes Reason for Co-Treatment: Complexity of the patient's impairments (multi-system involvement);For patient/therapist safety   OT goals addressed during session: ADL's and self-care;Strengthening/ROM      AM-PAC OT "6 Clicks" Daily Activity     Outcome Measure   Help from another person eating meals?: None Help from another person taking care of personal grooming?: A Little Help from another person toileting, which includes using toliet, bedpan, or urinal?: A Little Help from another person bathing (including washing, rinsing, drying)?: A Little Help from another person to put on and taking off regular upper body clothing?: A Little Help from another person to put on and taking off regular lower body clothing?: A Little 6 Click Score: 19    End of Session Equipment Utilized During Treatment: Rolling walker  OT Visit  Diagnosis: Unsteadiness on feet (R26.81);Other abnormalities of gait and mobility (R26.89);Muscle weakness (generalized) (M62.81);Other symptoms and signs involving cognitive function;Dizziness and giddiness (R42)   Activity Tolerance Patient tolerated treatment well   Patient Left in chair;with call bell/phone within reach   Nurse Communication Mobility status;Other (comment) (encourage ambulation)        Time: 2440-1027 OT Time Calculation (min): 37 min  Charges: OT General Charges $OT Visit: 1 Visit OT Treatments $Therapeutic Activity: 8-22 mins  Maurie Boettcher, OT/L   Acute OT Clinical Specialist [place] Pager [phone] Office [phone]    [facility] 12/30/2019, 3:38 PM

## 2019-12-31 DIAGNOSIS — D696 Thrombocytopenia, unspecified: Secondary | ICD-10-CM

## 2019-12-31 LAB — HEPARIN INDUCED PLATELET AB (HIT ANTIBODY): Heparin Induced Plt Ab: 0.262 OD (ref 0.000–0.400)

## 2019-12-31 LAB — COMPREHENSIVE METABOLIC PANEL
ALT: 93 U/L — ABNORMAL HIGH (ref 0–44)
AST: 37 U/L (ref 15–41)
Albumin: 2.8 g/dL — ABNORMAL LOW (ref 3.5–5.0)
Alkaline Phosphatase: 36 U/L — ABNORMAL LOW (ref 38–126)
Anion gap: 8 (ref 5–15)
BUN: 24 mg/dL — ABNORMAL HIGH (ref 8–23)
CO2: 26 mmol/L (ref 22–32)
Calcium: 8.3 mg/dL — ABNORMAL LOW (ref 8.9–10.3)
Chloride: 107 mmol/L (ref 98–111)
Creatinine, Ser: 1.2 mg/dL (ref 0.61–1.24)
GFR, Estimated: >60 mL/min (ref >60)
Glucose, Bld: 95 mg/dL (ref 70–99)
Potassium: 4 mmol/L (ref 3.5–5.1)
Sodium: 141 mmol/L (ref 135–145)
Total Bilirubin: 1.6 mg/dL — ABNORMAL HIGH (ref 0.3–1.2)
Total Protein: 5.2 g/dL — ABNORMAL LOW (ref 6.5–8.1)

## 2019-12-31 LAB — CBC
HCT: 34.7 % — ABNORMAL LOW (ref 39.0–52.0)
Hemoglobin: 11.5 g/dL — ABNORMAL LOW (ref 13.0–17.0)
MCH: 29.7 pg (ref 26.0–34.0)
MCHC: 33.1 g/dL (ref 30.0–36.0)
MCV: 89.7 fL (ref 80.0–100.0)
Platelets: 60 10*3/uL — ABNORMAL LOW (ref 150–400)
RBC: 3.87 MIL/uL — ABNORMAL LOW (ref 4.22–5.81)
RDW: 16.9 % — ABNORMAL HIGH (ref 11.5–15.5)
WBC: 3.6 10*3/uL — ABNORMAL LOW (ref 4.0–10.5)
nRBC: 0 % (ref 0.0–0.2)

## 2019-12-31 LAB — GLUCOSE, CAPILLARY
Glucose-Capillary: 86 mg/dL (ref 70–99)
Glucose-Capillary: 115 mg/dL — ABNORMAL HIGH (ref 70–99)
Glucose-Capillary: 92 mg/dL (ref 70–99)
Glucose-Capillary: 107 mg/dL — ABNORMAL HIGH (ref 70–99)

## 2019-12-31 LAB — HAPTOGLOBIN: Haptoglobin: 30 mg/dL — ABNORMAL LOW (ref 32–363)

## 2019-12-31 NOTE — Progress Notes (Addendum)
Physical Therapy Treatment Patient Details Name: Frank Love MRN: [id number] DOB: 1955/06/04 Today's Date: 12/31/2019    History of Present Illness 64 y.o. male Admitted 11/19/2 with medical history significant for chronic systolic CHF, ischemic cardiomyopathy with LVEF 35 to 40%, coronary artery disease status post CABG, penile cancer, carotid artery stenosis, who presented to [facility] via EMS after being found severely hypoxic at home. In the ED, he required nonrebreather and high flow nasal cannula to maintain O2 saturation greater than 92%.  Work-up in the ED revealed severe acute hypoxic respiratory failure secondary to COVID-19 viral pneumonia. Unvaccinated. 12/23/19 discharge cancelled due to onset orthostatic hypotension.    PT Comments    Patient able to tolerate ambulation with fewer rounds of seated and standing exercises compared to 12/6. See BP below. If continues to make this progress, can likely go home IF family can provide 24/7 supervision. (patient reported his wife is in the ED at [facility] due to confusion ?related to new thyroid meds). If family cannot, then would recommend CIR (per Dr E today is day 18 of isolation) with goal to be modified independent prior to discharge home. Discussed with RN additional walking would really benefit patient. Must use ABD binder, TED hose, and do seated exercises prior to walking.   89/56 HR 72 seated with TED hose and abdominal binder 103/66 HR 78 after seated exercises 103/63 73 sitting after standing exercises 104/63  71 sitting after walking 50 ft 108/70  74 sitting after walking 140 ft    Follow Up Recommendations  Home health PT;Supervision/Assistance - 24 hour (if orthostasis persists, may need inpatient rehab/SNF )     Equipment Recommendations  Rolling walker with 5" wheels    Recommendations for Other Services       Precautions / Restrictions Precautions Precautions: Other (comment) Precaution Comments: orthostasis     Mobility  Bed Mobility                  Transfers Overall transfer level: Needs assistance Equipment used: Rolling walker (2 wheeled) Transfers: Sit to/from Stand Sit to Stand: Supervision         General transfer comment: no cues needed for correct use of RW; supervision due to orthostasis  Ambulation/Gait Ambulation/Gait assistance: Min guard Gait Distance (Feet): 50 Feet (seated rest; 140) Assistive device: Rolling walker (2 wheeled) Gait Pattern/deviations: Step-through pattern;Decreased stride length;Trunk flexed Gait velocity: decr   General Gait Details: after single round of seated and single round of standing exercises to incr BP prior to ambulation (with TED hose and abd binder)   Stairs             Wheelchair Mobility    Modified Rankin (Stroke Patients Only)       Balance Overall balance assessment: Needs assistance   Sitting balance-Leahy Scale: Good     Standing balance support: Bilateral upper extremity supported Standing balance-Leahy Scale: Poor Standing balance comment: reliant on BUE support                            Cognition Arousal/Alertness: Awake/alert Behavior During Therapy: WFL for tasks assessed/performed Overall Cognitive Status: Impaired/Different from baseline Area of Impairment: Awareness;Safety/judgement;Problem solving                   Current Attention Level: Selective   Following Commands: Follows multi-step commands inconsistently Safety/Judgement: Decreased awareness of safety;Decreased awareness of deficits Awareness: Emergent Problem Solving: Slow processing;Difficulty sequencing;Requires  verbal cues;Requires tactile cues General Comments: decreaed safety awarness (getting up to [facility] by himself despite orthostasis); pt reports slow processing since COVID      Exercises Other Exercises Other Exercises: seated heel raises x 20 reps Other Exercises: counterpressure exercises with  BUE and BLE Other Exercises: Standing marching x 50 steps    General Comments        Pertinent Vitals/Pain Pain Assessment: No/denies pain    Home Living                      Prior Function            PT Goals (current goals can now be found in the care plan section) Acute Rehab PT Goals Patient Stated Goal: to go home Time For Goal Achievement: 01/13/20 Potential to Achieve Goals: Good Progress towards PT goals: Progressing toward goals    Frequency    Min 3X/week      PT Plan Current plan remains appropriate (if orthostasis can be managed and has 24/7 supervision )    Co-evaluation              AM-PAC PT "6 Clicks" Mobility   Outcome Measure  Help needed turning from your back to your side while in a flat bed without using bedrails?: None Help needed moving from lying on your back to sitting on the side of a flat bed without using bedrails?: A Little Help needed moving to and from a bed to a chair (including a wheelchair)?: A Little Help needed standing up from a chair using your arms (e.g., wheelchair or bedside chair)?: A Little Help needed to walk in hospital room?: A Little Help needed climbing 3-5 steps with a railing? : Total 6 Click Score: 17    End of Session Equipment Utilized During Treatment: Gait belt Activity Tolerance: Patient tolerated treatment well Patient left: with call bell/phone within reach;in chair Nurse Communication: Other (comment) (needs exercises, TED, abd binder  prior to walking) PT Visit Diagnosis: Other abnormalities of gait and mobility (R26.89);Muscle weakness (generalized) (M62.81);Difficulty in walking, not elsewhere classified (R26.2)     Time: 0355-9741 PT Time Calculation (min) (ACUTE ONLY): 48 min  Charges:  $Gait Training: 23-37 mins $Therapeutic Exercise: 8-22 mins                      [name], PT Pager [phone]    [name] 12/31/2019, 10:34 AM

## 2019-12-31 NOTE — Progress Notes (Signed)
PROGRESS NOTE                                                                                                                                                                                                             Patient Demographics:    Frank Love, is a 64 y.o. male, DOB - 01/31/1955, YYT:[id number]  Admit date - 12/13/2019   Admitting Physician Kayleen Memos, DO  Outpatient Primary MD for the patient is [name], NP  LOS - 18  Chief Complaint  Patient presents with  . Covid Exposure  . Shortness of Breath  . generalized weakness       Brief Narrative  - 64 y.o.malewith medical history significant forchronic systolic CHF, ischemic cardiomyopathy with LVEF 35 to 40%, coronary artery disease status post CABG, penile cancer, carotid artery stenosis, who presented to [facility] via EMS after being found severely hypoxicathome.Unfortunately patient is unvaccinated against Covid, his work-up was significant for COVID-19 of pneumonia and severe hypoxia for which he is admitted by [facility].  Patient with significant oxygen requirement at one point on 30 L heated high flow nasal cannula, this has improved when he is currently on room air, but he remains with significant orthostatics with pressure dropped in the 60s upon standing for he H been followed with CHF, as well he did develop thrombocytopenia.   Subjective:   Patient denies nausea this morning, he remains orthostatic, but it did improve still reports dizziness and lightheadedness, but he was able to tolerate some activity with PT .   Assessment  & Plan :     Acute hypoxic respiratory failure due to COVID-19 pneumonia.   - He is unvaccinated and incurred severe parenchymal lung injur. -Initially requiring 30 L heated high flow nasal cannula, this has significantly improved, he is currently on room air. -He was treated with steroids, Remdesivir and Actemra. -He was encouraged use incentive spirometry and  flutter valve.    Recent Labs  Lab 12/25/19 0112 12/25/19 0112 12/26/19 0051 12/26/19 0051 12/27/19 0310 12/27/19 0310 12/28/19 0355 12/29/19 0439 12/30/19 0348 12/30/19 0820 12/30/19 1106 12/31/19 0601  WBC 5.3   < > 5.6   < > 4.3  --  3.9* 3.4* 3.7*  --   --  3.6*  HGB 10.6*   < > 11.1*   < > 11.5*  --  11.2* 11.0* 11.0*  --   --  11.5*  HCT 31.7*   < > 33.2*   < > 35.7*  --  33.1* 33.3* 35.1*  --   --  34.7*  PLT 105*   < > 103*   < > 92*   < > 86* 85* 68*  --  70* 60*  BNP 354.2*  --  355.6*  --  440.7*  --   --   --   --  502.9*  --   --   DDIMER  --   --   --   --   --   --   --   --   --   --  1.02*  --   AST  --   --   --   --   --   --   --   --  51*  --   --  37  ALT  --   --   --   --   --   --   --   --  114*  --   --  93*  ALKPHOS  --   --   --   --   --   --   --   --  36*  --   --  36*  BILITOT  --   --   --   --   --   --   --   --  1.7*  --   --  1.6*  ALBUMIN  --   --   --   --   --   --   --   --  2.6*  --   --  2.8*  INR  --   --   --   --   --   --   --   --   --   --  1.1  --    < > = values in this interval not displayed.     Chronic combined systolic and diastolic heart failure -  recent echocardiogram shows EF of 35%.  Repeat 2D echo during hospital stay showing improved EF 45%.   -Management by CHF team, cardiology . -Home medication including Coreg, Wilder Glade, Entresto and digoxin on hold given low blood pressure and bradycardia .  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 -He is on midodrine 15 mg 3 times daily, maximum dose, cannot increase any further, compliant with TED hose, abdominal binder, received fluid boluses in the past, all his antihypertensives and diuretic medication has been held . -He is with a profound orthostasis, quite symptomatic, but this has been improving over last 24 hours with great assistance of PT and OT, where they  have been standing him few rounds before physical activity where he is been able to tolerate walking with him . -Still unsafe to go home, but he is improving, hopefully he will improve to the point where it safe for him to go home, or possibly will need CIR after his 21 days Covid isolation is over in 72 hours .  Transaminitis -It is trending down, no acute finding in liver or gallbladder ultrasound  Thrombocytopenia -Likely related to Covid, as no other etiology could be found, the panel with no evidence of schistocytes . -Stopped his DVT prophylaxis Lovenox . -No evidence of splenomegaly and ultrasound . -I have stopped his PPI as sometimes may contribute to thrombocytopenia.  Dyslipidemia.  On statin.  CAD s/p CABG.  On aspirin and statin combination, blockers has been stopped due to bradycardia  and orthostasis .  Hyperkalemia.  Treated will monitor.  Mildly suppressed TSH.  Free T4 slightly high.  Since he is bradycardic and hypotensive I do not think he is a exhibiting any signs of hyperthyroidism, will request to repeat TSH, T3 and free T4 in 4 weeks by PCP.  Left foot pain has significantly improved, left foot x-ray with no acute findings.   Condition - Extremely Guarded  Family Communication  : Discussed with patient in details, answered all his questions.  Daughter was updated by phone on 12/31/2019 .  Code Status :  Full  Consults  :  Cards  Procedures  :    TTE  Leg [place] - No DVT  PUD Prophylaxis : PPI  Disposition Plan  :    Status is: Inpatient  Remains inpatient appropriate because:IV treatments appropriate due to intensity of illness or inability to take PO   Dispo:  Patient From:    Planned Disposition:    Expected discharge date: 01/03/20  Medically stable for discharge:  It is not medically stable for discharge given his significant orthostasis, proving gradually, possibly may need CIR placement if he remains orthostatic after finishing his 21 days  Covid quarantine   DVT Prophylaxis  :  Lovenox be stopped given thrombocytopenia and he will be started on SCD   Lab Results  Component Value Date   PLT 60 (L) 12/31/2019    Diet :  Diet Order            Diet regular Room service appropriate? Yes; Fluid consistency: Thin  Diet effective now                  Inpatient Medications Scheduled Meds: . vitamin C  500 mg Oral Daily  . aspirin EC  81 mg Oral QHS  . atorvastatin  40 mg Oral q1800  . bisacodyl  10 mg Oral Daily  . cholecalciferol  1,000 Units Oral Daily  . feeding supplement (GLUCERNA SHAKE)  237 mL Oral TID BM  . folic acid  1 mg Oral Daily  . insulin aspart  0-9 Units Subcutaneous TID WC  . insulin glargine  6 Units Subcutaneous Daily  . melatonin  10 mg Oral QHS  . midodrine  15 mg Oral TID WC  . multivitamin with minerals  1 tablet Oral Daily  . polyethylene glycol  17 g Oral Daily  . thiamine  100 mg Oral Daily   Continuous Infusions:  PRN Meds:.acetaminophen, Ipratropium-Albuterol, metoCLOPramide (REGLAN) injection, nitroGLYCERIN, ondansetron (ZOFRAN) IV, sodium chloride  Antibiotics  :   Anti-infectives (From admission, onward)   Start     Dose/Rate Route Frequency Ordered Stop   12/15/19 1000  remdesivir 100 mg in sodium chloride 0.9 % 100 mL IVPB       "Followed by" Linked Group Details   100 mg 200 mL/hr over 30 Minutes Intravenous Daily 12/14/19 0002 12/18/19 0849   12/14/19 1000  remdesivir 100 mg in sodium chloride 0.9 % 100 mL IVPB  Status:  Discontinued       "Followed by" Linked Group Details   100 mg 200 mL/hr over 30 Minutes Intravenous Daily 12/13/19 2358 12/14/19 0001   12/14/19 0030  remdesivir 100 mg in sodium chloride 0.9 % 100 mL IVPB       "Followed by" Linked Group Details   100 mg 200 mL/hr over 30 Minutes Intravenous Every 30 min 12/14/19 0002 12/14/19 0210   12/14/19 0000  remdesivir 200 mg in sodium chloride  0.9% 250 mL IVPB  Status:  Discontinued       "Followed by" Linked  Group Details   200 mg 580 mL/hr over 30 Minutes Intravenous Once 12/13/19 2358 12/14/19 0001   12/13/19 2245  vancomycin (VANCOREADY) IVPB 2000 mg/400 mL        2,000 mg 200 mL/hr over 120 Minutes Intravenous  Once 12/13/19 2241 12/14/19 0210   12/13/19 2245  ceFEPIme (MAXIPIME) 2 g in sodium chloride 0.9 % 100 mL IVPB        2 g 200 mL/hr over 30 Minutes Intravenous  Once 12/13/19 2241 12/14/19 0002          Objective:   Vitals:   12/30/19 2030 12/30/19 2327 12/31/19 0415 12/31/19 0700  BP: (!) 94/58 (!) 114/56 (!) 96/57 97/63  Pulse: 67 (!) 55 (!) 53 68  Resp: 20 19 19 20   Temp: 97.9 F (36.6 C) 98.1 F (36.7 C) 97.6 F (36.4 C) 98 F (36.7 C)  TempSrc: Oral Oral Oral Oral  SpO2: 93% 91% 95% 97%  Weight:   105.7 kg   Height:        SpO2: 97 % O2 Flow Rate (L/min): 1 L/min FiO2 (%): 36 %  Wt Readings from Last 3 Encounters:  12/31/19 105.7 kg  09/10/19 114.8 kg  08/13/19 114.8 kg     Intake/Output Summary (Last 24 hours) at 12/31/2019 1132 Last data filed at 12/31/2019 0910 Gross per 24 hour  Intake 360 ml  Output --  Net 360 ml     Physical Exam  Awake Alert, Oriented X 3, No new F.N deficits, Normal affect Symmetrical Chest wall movement, Good air movement bilaterally, CTAB RRR,No Gallops,Rubs or new Murmurs, No Parasternal Heave +ve B.Sounds, Abd Soft, No tenderness, No rebound - guarding or rigidity. No Cyanosis, Clubbing , has trace edema, No new Rash or bruise         Data Review:   Recent Labs  Lab 12/25/19 0112 12/25/19 0112 12/26/19 0051 12/26/19 0051 12/27/19 0310 12/27/19 0310 12/28/19 0355 12/29/19 0439 12/30/19 0348 12/30/19 1106 12/31/19 0601  WBC 5.3   < > 5.6   < > 4.3  --  3.9* 3.4* 3.7*  --  3.6*  HGB 10.6*   < > 11.1*   < > 11.5*  --  11.2* 11.0* 11.0*  --  11.5*  HCT 31.7*   < > 33.2*   < > 35.7*  --  33.1* 33.3* 35.1*  --  34.7*  PLT 105*   < > 103*   < > 92*   < > 86* 85* 68* 70* 60*  MCV 86.4   < > 86.2   < >  87.9  --  87.6 88.8 90.5  --  89.7  MCH 28.9   < > 28.8   < > 28.3  --  29.6 29.3 28.4  --  29.7  MCHC 33.4   < > 33.4   < > 32.2  --  33.8 33.0 31.3  --  33.1  RDW 15.1   < > 15.2   < > 15.6*  --  16.1* 16.6* 16.7*  --  16.9*  LYMPHSABS 0.6*  --  0.7  --  0.8  --   --   --   --   --   --   MONOABS 0.3  --  0.4  --  0.3  --   --   --   --   --   --  EOSABS 0.1  --  0.1  --  0.2  --   --   --   --   --   --   BASOSABS 0.0  --  0.0  --  0.0  --   --   --   --   --   --    < > = values in this interval not displayed.    Recent Labs  Lab 12/25/19 0112 12/25/19 0112 12/26/19 0051 12/26/19 0051 12/27/19 0310 12/28/19 0355 12/29/19 0439 12/30/19 0348 12/30/19 0820 12/30/19 1106 12/31/19 0601  NA 140   < > 138   < > 140 140 142 141  --   --  141  K 4.3   < > 4.4   < > 4.2 4.2 4.2 4.4  --   --  4.0  CL 105   < > 105   < > 103 104 105 106  --   --  107  CO2 27   < > 27   < > 28 29 29 29   --   --  26  GLUCOSE 114*   < > 97   < > 89 93 92 94  --   --  95  BUN 43*   < > 36*   < > 28* 26* 26* 25*  --   --  24*  CREATININE 1.13   < > 1.06   < > 1.18 1.31* 1.31* 1.39*  --   --  1.20  CALCIUM 7.9*   < > 8.1*   < > 8.3* 8.1* 8.0* 7.9*  --   --  8.3*  AST  --   --   --   --   --   --   --  51*  --   --  37  ALT  --   --   --   --   --   --   --  114*  --   --  93*  ALKPHOS  --   --   --   --   --   --   --  36*  --   --  36*  BILITOT  --   --   --   --   --   --   --  1.7*  --   --  1.6*  ALBUMIN  --   --   --   --   --   --   --  2.6*  --   --  2.8*  MG 2.4  --  2.1  --  1.9  --   --   --   --   --   --   DDIMER  --   --   --   --   --   --   --   --   --  1.02*  --   INR  --   --   --   --   --   --   --   --   --  1.1  --   BNP 354.2*  --  355.6*  --  440.7*  --   --   --  502.9*  --   --    < > = values in this interval not displayed.    Recent Labs  Lab 12/25/19 0112 12/26/19 0051 12/27/19 0310 12/30/19 0820 12/30/19 1106  DDIMER  --   --   --   --  1.02*  BNP 354.2* 355.6*  440.7* 502.9*  --     ------------------------------------------------------------------------------------------------------------------ No results for input(s): CHOL, HDL, LDLCALC, TRIG, CHOLHDL, LDLDIRECT in the last 72 hours.  Lab Results  Component Value Date   HGBA1C 6.1 (H) 12/14/2019   ------------------------------------------------------------------------------------------------------------------ No results for input(s): TSH, T4TOTAL, T3FREE, THYROIDAB in the last 72 hours.  Invalid input(s): FREET3 ------------------------------------------------------------------------------------------------------------------ No results for input(s): VITAMINB12, FOLATE, FERRITIN, TIBC, IRON, RETICCTPCT in the last 72 hours.  Coagulation profile Recent Labs  Lab 12/30/19 1106  INR 1.1    Recent Labs    12/30/19 1106  DDIMER 1.02*    Cardiac Enzymes No results for input(s): CKMB, TROPONINI, MYOGLOBIN in the last 168 hours.  Invalid input(s): CK ------------------------------------------------------------------------------------------------------------------    Component Value Date/Time   BNP 502.9 (H) 12/30/2019 0820    Micro Results No results found for this or any previous visit (from the past 240 hour(s)).  Radiology Reports US Abdomen Complete  Result Date: 12/30/2019 CLINICAL DATA:  Transaminitis.  Evaluate for splenomegaly. EXAM: ABDOMEN ULTRASOUND COMPLETE COMPARISON:  CT 04/23/2019 FINDINGS: Gallbladder: No gallstones or wall thickening visualized. No sonographic Murphy sign noted by sonographer. Common bile duct: Diameter: Normal caliber, 4 mm Liver: No focal lesion identified. Within normal limits in parenchymal echogenicity. Portal vein is patent on color Doppler imaging with normal direction of blood flow towards the liver. IVC: No abnormality visualized. Pancreas: Visualized portion unremarkable. Spleen: Normal size, 8.9 cm in craniocaudal length. No focal  abnormality. Right Kidney: Length: 12.2 cm. Echogenicity within normal limits. No mass or hydronephrosis visualized. Left Kidney: Length: 12.6 cm. Echogenicity within normal limits. No mass or hydronephrosis visualized. Abdominal aorta: No aneurysm visualized. Other findings: None. IMPRESSION: No evidence of splenomegaly. No acute findings. Electronically Signed   By: Rolm Baptise M.D.   On: 12/30/2019 13:45   DG Chest Portable 1 View  Result Date: 12/13/2019 CLINICAL DATA:  Shortness of breath, suspicion of COVID EXAM: PORTABLE CHEST 1 VIEW COMPARISON:  Radiograph 01/07/2019, CT 11/20/2018 FINDINGS: Heterogeneous consolidative and hazy opacity is present in a lower lung and peripheral predominance most coalescent in the left lung periphery. Some more bandlike opacities, possibly subsegmental atelectatic changes or scarring. No pneumothorax. No visible effusion though the costophrenic sulci are partially collimated. Postsurgical changes related to prior CABG including intact and aligned sternotomy wires and multiple surgical clips projecting over the mediastinum. Telemetry leads overlie the chest. The aorta is calcified. The remaining cardiomediastinal contours are unremarkable. Degenerative changes are present in the imaged spine and shoulders. IMPRESSION: 1. Heterogeneous consolidative and hazy opacity in a lower lung and peripheral predominance most coalescent in the left lung periphery, compatible with a multifocal pneumonia including atypical viral etiology in the appropriate clinical setting. 2. More bandlike opacities likely reflect subsegmental atelectatic changes or scarring. Electronically Signed   By: Lovena Le M.D.   On: 12/13/2019 22:28   DG Foot 2 Views Left  Result Date: 12/28/2019 CLINICAL DATA:  Left foot pain. EXAM: LEFT FOOT - 2 VIEW COMPARISON:  None. FINDINGS: No fracture or bone lesion. Joints are normally spaced and aligned. No significant arthropathic change. Tiny plantar and  small to moderate dorsal calcaneal spurs. Nonspecific forefoot soft tissue swelling. IMPRESSION: 1. No fracture, joint abnormality or bone lesion. 2. Nonspecific soft tissue swelling.  Calcaneal spurs. Electronically Signed   By: Lajean Manes M.D.   On: 12/28/2019 13:49   ECHOCARDIOGRAM COMPLETE  Result Date: 12/24/2019    ECHOCARDIOGRAM REPORT   Patient Name:  Kelii R Grace Date of Exam: 12/24/2019 Medical Rec #:  [id number]          Height:       74.0 in Accession #:    [id number]         Weight:       247.6 lb Date of Birth:  [birth year]          BSA:          2.380 m Patient Age:    54 years           BP:           102/54 mmHg Patient Gender: M                  HR:           61 bpm. Exam Location:  Inpatient Procedure: 2D Echo, Cardiac Doppler and Color Doppler Indications:    Z32.99 Chronic systolic (congestive) heart failure  History:        Patient has prior history of Echocardiogram examinations, most                 recent 04/18/2019. COVID-19 Positive.  Sonographer:    Jonelle Sidle Dance Referring Phys: [id number] Carlene Coria  Sonographer Comments: No subcostal window. IMPRESSIONS  1. Left ventricular ejection fraction, by estimation, is 45 to 50%. The left ventricle has mildly decreased function. The left ventricle demonstrates global hypokinesis. The left ventricular internal cavity size was mildly dilated. There is mild left ventricular hypertrophy. Left ventricular diastolic parameters are indeterminate.  2. Right ventricular systolic function is mildly reduced. The right ventricular size is normal.  3. Left atrial size was moderately dilated.  4. The mitral valve is grossly normal. Trivial mitral valve regurgitation. No evidence of mitral stenosis.  5. The aortic valve is grossly normal. Aortic valve regurgitation is trivial. No aortic stenosis is present. Comparison(s): Prior images reviewed side by side. Changes from prior study are noted. Conclusion(s)/Recommendation(s): No hemodynamically  significant valvular heart disease. LV function appears improved when directly compared to prior. FINDINGS  Left Ventricle: Left ventricular ejection fraction, by estimation, is 45 to 50%. The left ventricle has mildly decreased function. The left ventricle demonstrates global hypokinesis. The left ventricular internal cavity size was mildly dilated. There is  mild left ventricular hypertrophy. Left ventricular diastolic parameters are indeterminate. Right Ventricle: The right ventricular size is normal. Right vetricular wall thickness was not well visualized. Right ventricular systolic function is mildly reduced. Left Atrium: Left atrial size was moderately dilated. Right Atrium: Right atrial size was normal in size. Pericardium: There is no evidence of pericardial effusion. Mitral Valve: The mitral valve is grossly normal. Trivial mitral valve regurgitation. No evidence of mitral valve stenosis. Tricuspid Valve: The tricuspid valve is normal in structure. Tricuspid valve regurgitation is trivial. Aortic Valve: The aortic valve is grossly normal. Aortic valve regurgitation is trivial. Aortic regurgitation PHT measures 521 msec. No aortic stenosis is present. Pulmonic Valve: The pulmonic valve was not well visualized. Pulmonic valve regurgitation is mild. No evidence of pulmonic stenosis. Aorta: The aortic root and ascending aorta are structurally normal, with no evidence of dilitation and the aortic arch was not well visualized. Venous: The inferior vena cava was not well visualized. IAS/Shunts: The atrial septum is grossly normal.  LEFT VENTRICLE PLAX 2D LVIDd:         5.65 cm  Diastology LVIDs:         4.13 cm  LV e' medial:  6.09 cm/s LV PW:         1.40 cm  LV E/e' medial:  11.8 LV IVS:        1.12 cm  LV e' lateral:   7.51 cm/s LVOT diam:     2.10 cm  LV E/e' lateral: 9.6 LV SV:         74 LV SV Index:   31 LVOT Area:     3.46 cm  RIGHT VENTRICLE RV Basal diam:  2.96 cm RV S prime:     7.40 cm/s TAPSE  (M-mode): 1.4 cm LEFT ATRIUM              Index       RIGHT ATRIUM           Index LA diam:        5.50 cm  2.31 cm/m  RA Area:     13.80 cm LA Vol (A2C):   114.0 ml 47.90 ml/m RA Volume:   28.40 ml  11.93 ml/m LA Vol (A4C):   87.6 ml  36.81 ml/m LA Biplane Vol: 101.0 ml 42.44 ml/m  AORTIC VALVE LVOT Vmax:   85.00 cm/s LVOT Vmean:  60.800 cm/s LVOT VTI:    0.213 m AI PHT:      521 msec  AORTA Ao Root diam: 3.50 cm Ao Asc diam:  3.70 cm MITRAL VALVE MV Area (PHT): 2.54 cm    SHUNTS MV Decel Time: 299 msec    Systemic VTI:  0.21 m MV E velocity: 71.80 cm/s  Systemic Diam: 2.10 cm MV A velocity: 81.50 cm/s MV E/A ratio:  0.88 Buford Dresser MD Electronically signed by Buford Dresser MD Signature Date/Time: 12/24/2019/12:01:13 PM    Final    VAS [place] LOWER EXTREMITY VENOUS (DVT)  Result Date: 12/15/2019  Lower Venous DVT Study Indications: Elevated ddimer.  Comparison Study: 07/09/18 previous Performing Technologist: Abram Sander RVS  Examination Guidelines: A complete evaluation includes B-mode imaging, spectral Doppler, color Doppler, and power Doppler as needed of all accessible portions of each vessel. Bilateral testing is considered an integral part of a complete examination. Limited examinations for reoccurring indications may be performed as noted. The reflux portion of the exam is performed with the patient in reverse Trendelenburg.  +---------+---------------+---------+-----------+----------+--------------+ RIGHT    CompressibilityPhasicitySpontaneityPropertiesThrombus Aging +---------+---------------+---------+-----------+----------+--------------+ CFV      Full           Yes      Yes                                 +---------+---------------+---------+-----------+----------+--------------+ SFJ      Full                                                        +---------+---------------+---------+-----------+----------+--------------+ FV Prox  Full                                                         +---------+---------------+---------+-----------+----------+--------------+ FV Mid   Full                                                        +---------+---------------+---------+-----------+----------+--------------+  FV DistalFull                                                        +---------+---------------+---------+-----------+----------+--------------+ PFV      Full                                                        +---------+---------------+---------+-----------+----------+--------------+ POP      Full           Yes      Yes                                 +---------+---------------+---------+-----------+----------+--------------+ PTV      Full                                                        +---------+---------------+---------+-----------+----------+--------------+ PERO     Full                                                        +---------+---------------+---------+-----------+----------+--------------+   +---------+---------------+---------+-----------+----------+--------------+ LEFT     CompressibilityPhasicitySpontaneityPropertiesThrombus Aging +---------+---------------+---------+-----------+----------+--------------+ CFV      Full           Yes      Yes                                 +---------+---------------+---------+-----------+----------+--------------+ SFJ      Full                                                        +---------+---------------+---------+-----------+----------+--------------+ FV Prox  Full                                                        +---------+---------------+---------+-----------+----------+--------------+ FV Mid   Full                                                        +---------+---------------+---------+-----------+----------+--------------+ FV DistalFull                                                         +---------+---------------+---------+-----------+----------+--------------+  PFV      Full                                                        +---------+---------------+---------+-----------+----------+--------------+ POP      Full           Yes      Yes                                 +---------+---------------+---------+-----------+----------+--------------+ PTV      Full                                                        +---------+---------------+---------+-----------+----------+--------------+ PERO     Full                                                        +---------+---------------+---------+-----------+----------+--------------+     Summary: BILATERAL: - No evidence of deep vein thrombosis seen in the lower extremities, bilaterally. - No evidence of superficial venous thrombosis in the lower extremities, bilaterally. -No evidence of popliteal cyst, bilaterally. RIGHT: - Findings appear essentially unchanged compared to previous examination.   *See table(s) above for measurements and observations. Electronically signed by Deitra Mayo MD on 12/15/2019 at 6:22:41 PM.    Final     Phillips Climes M.D on 12/31/2019 at 11:32 AM  To page go to [email or web address]

## 2020-01-01 LAB — COMPREHENSIVE METABOLIC PANEL
ALT: 91 U/L — ABNORMAL HIGH (ref 0–44)
AST: 44 U/L — ABNORMAL HIGH (ref 15–41)
Albumin: 2.9 g/dL — ABNORMAL LOW (ref 3.5–5.0)
Alkaline Phosphatase: 37 U/L — ABNORMAL LOW (ref 38–126)
Anion gap: 9 (ref 5–15)
BUN: 28 mg/dL — ABNORMAL HIGH (ref 8–23)
CO2: 24 mmol/L (ref 22–32)
Calcium: 8.5 mg/dL — ABNORMAL LOW (ref 8.9–10.3)
Chloride: 108 mmol/L (ref 98–111)
Creatinine, Ser: 1.17 mg/dL (ref 0.61–1.24)
GFR, Estimated: >60 mL/min (ref >60)
Glucose, Bld: 98 mg/dL (ref 70–99)
Potassium: 4.2 mmol/L (ref 3.5–5.1)
Sodium: 141 mmol/L (ref 135–145)
Total Bilirubin: 1.4 mg/dL — ABNORMAL HIGH (ref 0.3–1.2)
Total Protein: 5.4 g/dL — ABNORMAL LOW (ref 6.5–8.1)

## 2020-01-01 LAB — GLUCOSE, CAPILLARY
Glucose-Capillary: 94 mg/dL (ref 70–99)
Glucose-Capillary: 96 mg/dL (ref 70–99)
Glucose-Capillary: 115 mg/dL — ABNORMAL HIGH (ref 70–99)
Glucose-Capillary: 131 mg/dL — ABNORMAL HIGH (ref 70–99)

## 2020-01-01 LAB — CBC
HCT: 36.7 % — ABNORMAL LOW (ref 39.0–52.0)
Hemoglobin: 11.5 g/dL — ABNORMAL LOW (ref 13.0–17.0)
MCH: 28.5 pg (ref 26.0–34.0)
MCHC: 31.3 g/dL (ref 30.0–36.0)
MCV: 91.1 fL (ref 80.0–100.0)
Platelets: 60 10*3/uL — ABNORMAL LOW (ref 150–400)
RBC: 4.03 MIL/uL — ABNORMAL LOW (ref 4.22–5.81)
RDW: 17.1 % — ABNORMAL HIGH (ref 11.5–15.5)
WBC: 3.6 10*3/uL — ABNORMAL LOW (ref 4.0–10.5)
nRBC: 0 % (ref 0.0–0.2)

## 2020-01-01 MED ORDER — BENZOCAINE 10 % MT GEL
Freq: Two times a day (BID) | OROMUCOSAL | Status: DC | PRN
  Filled 2020-01-01: qty 9

## 2020-01-01 NOTE — Progress Notes (Signed)
Inpatient Rehabilitation Admissions Coordinator  Asked by SW, Percell Locus, to assess possibility for a [facility] admit. Noted COVID + 12/13/19. Patients are eligible to be considered for admit to the [facility] when cleared from airborne precautions by acute MD or Infectious disease. Otherwise they will need to be >20 days from their positive test with recovery/improvement in symptoms ( 01/03/20) or 2 negative tests.   Noted symptomatic ortho stasis barrier to d/c home with wife currently hospitalized and need for 24/7. [facility] would not have a bed to admit this patient likely this week. I discussed with SW, Percell Locus, that further clarification with family the need to arrange 24/7 assist of this patient at home for direct d/c home. Please call me with any questions.    Danne Baxter, RN, MSN Rehab Admissions Coordinator [phone] 10/01/2019 12:39 PM

## 2020-01-01 NOTE — Plan of Care (Signed)
  Problem: Education: Goal: Knowledge of risk factors and measures for prevention of condition will improve Outcome: Progressing   Problem: Respiratory: Goal: Will maintain a patent airway Outcome: Progressing Goal: Complications related to the disease process, condition or treatment will be avoided or minimized Outcome: Progressing   Problem: Education: Goal: Knowledge of General Education information will improve Description: Including pain rating scale, medication(s)/side effects and non-pharmacologic comfort measures Outcome: Progressing   Problem: Health Behavior/Discharge Planning: Goal: Ability to manage health-related needs will improve Outcome: Progressing   Problem: Clinical Measurements: Goal: Ability to maintain clinical measurements within normal limits will improve Outcome: Progressing Goal: Will remain free from infection Outcome: Progressing Goal: Diagnostic test results will improve Outcome: Progressing Goal: Respiratory complications will improve Outcome: Progressing Goal: Cardiovascular complication will be avoided Outcome: Progressing   Problem: Activity: Goal: Risk for activity intolerance will decrease Outcome: Progressing   Problem: Nutrition: Goal: Adequate nutrition will be maintained Outcome: Progressing   Problem: Coping: Goal: Level of anxiety will decrease Outcome: Progressing   Problem: Elimination: Goal: Will not experience complications related to bowel motility Outcome: Progressing Goal: Will not experience complications related to urinary retention Outcome: Progressing   Problem: Pain Managment: Goal: General experience of comfort will improve Outcome: Progressing   Problem: Safety: Goal: Ability to remain free from injury will improve Outcome: Progressing   Problem: Skin Integrity: Goal: Risk for impaired skin integrity will decrease Outcome: Progressing   Problem: Education: Goal: Ability to demonstrate management of  disease process will improve Outcome: Progressing Goal: Ability to verbalize understanding of medication therapies will improve Outcome: Progressing Goal: Individualized Educational Video(s) Outcome: Progressing   Problem: Activity: Goal: Capacity to carry out activities will improve Outcome: Progressing   Problem: Cardiac: Goal: Ability to achieve and maintain adequate cardiopulmonary perfusion will improve Outcome: Progressing

## 2020-01-01 NOTE — Progress Notes (Signed)
Occupational Therapy Treatment Patient Details Name: Frank Love MRN: [id number] DOB: February 01, 1955 Today's Date: 01/01/2020    History of present illness 64 y.o. male Admitted 11/19/2 with medical history significant for chronic systolic CHF, ischemic cardiomyopathy with LVEF 35 to 40%, coronary artery disease status post CABG, penile cancer, carotid artery stenosis, who presented to [facility] via EMS after being found severely hypoxic at home. In the ED, he required nonrebreather and high flow nasal cannula to maintain O2 saturation greater than 92%.  Work-up in the ED revealed severe acute hypoxic respiratory failure secondary to COVID-19 viral pneumonia. Unvaccinated. 12/23/19 discharge cancelled due to onset orthostatic hypotension.   Pt seen for OT follow up session, making steady progress toward OT goals. Session focused on progressing activity tolerance for ADL. Pt completed transfers at supervision level this date. He first completed a LBD training activity using a theraband and was able to complete this with set up assist and no RW for external support. He then completed x10 sit <> stands without external assist. Noted increased dysnpea with O2 sats ~92% on exertion. Pt required ~ 3 mins rest. He then completed x10 standing "sink push ups" again with dyspnea and SpO2 ~92% which recovered with 3 mins seated rest. Pt then mobilized a short household distance with min guard and RW. HR up to 88 bpm (pt baseline usually in mid-high 50s) and RR up to mid 30s. Pt remains orthostatic as listed below, but overall asymptomatic. D/c recs remain appropriate, will continue to follow.   OT comments  134/61: supine 94/56: after standing activity 92/55: after walk 108/64: back supine   Follow Up Recommendations  Home health OT;Supervision/Assistance - 24 hour ([facility] aide)    Equipment Recommendations  3 in 1 bedside commode    Recommendations for Other Services      Precautions / Restrictions  Precautions Precautions: Other (comment) Precaution Comments: orthostasis Restrictions Weight Bearing Restrictions: No       Mobility Bed Mobility Overal bed mobility: Needs Assistance Bed Mobility: Supine to Sit;Sit to Supine     Supine to sit: Supervision Sit to supine: Supervision      Transfers Overall transfer level: Needs assistance Equipment used: Rolling walker (2 wheeled) Transfers: Sit to/from Stand Sit to Stand: Supervision         General transfer comment: pt completed x10 sit <> stands with supervision assist and no RW. Pt then used RW without cues for safe hand placement in preparation to walk in hallway    Balance Overall balance assessment: Needs assistance Sitting-balance support: Feet supported Sitting balance-Leahy Scale: Good     Standing balance support: Bilateral upper extremity supported Standing balance-Leahy Scale: Poor Standing balance comment: reliant on BUE support                           ADL either performed or assessed with clinical judgement   ADL Overall ADL's : Needs assistance/impaired                     Lower Body Dressing: Set up;Sit to/from stand Lower Body Dressing Details (indicate cue type and reason): used theraband as practice to don/doff pants. Pt able to reach down and place over feet and stand up without physical assist Toilet Transfer: Supervision/safety;RW Toilet Transfer Details (indicate cue type and reason): close guard for safety         Functional mobility during ADLs: Min guard;Rolling walker General ADL Comments: pt progressing  well in ADL tolerance this session     Vision Baseline Vision/History: No visual deficits     Perception     Praxis      Cognition Arousal/Alertness: Awake/alert Behavior During Therapy: WFL for tasks assessed/performed Overall Cognitive Status: Impaired/Different from baseline Area of Impairment: Awareness;Safety/judgement;Problem solving                          Safety/Judgement: Decreased awareness of safety;Decreased awareness of deficits Awareness: Emergent Problem Solving: Slow processing;Requires verbal cues;Requires tactile cues General Comments: continues to require cues and redirection for poor safety awareness. Slow processing. Pt seems to be becoming more aware of this        Exercises     Shoulder Instructions       General Comments      Pertinent Vitals/ Pain       Pain Assessment: No/denies pain  Home Living                                          Prior Functioning/Environment              Frequency  Min 3X/week        Progress Toward Goals  OT Goals(current goals can now be found in the care plan section)  Progress towards OT goals: Progressing toward goals  Acute Rehab OT Goals Patient Stated Goal: to go home OT Goal Formulation: With patient Time For Goal Achievement: 01/13/20 Potential to Achieve Goals: Good  Plan Discharge plan remains appropriate    Co-evaluation                 AM-PAC OT "6 Clicks" Daily Activity     Outcome Measure   Help from another person eating meals?: None Help from another person taking care of personal grooming?: A Little Help from another person toileting, which includes using toliet, bedpan, or urinal?: A Little Help from another person bathing (including washing, rinsing, drying)?: A Little Help from another person to put on and taking off regular upper body clothing?: A Little Help from another person to put on and taking off regular lower body clothing?: A Little 6 Click Score: 19    End of Session Equipment Utilized During Treatment: Rolling walker  OT Visit Diagnosis: Unsteadiness on feet (R26.81);Other abnormalities of gait and mobility (R26.89);Muscle weakness (generalized) (M62.81);Other symptoms and signs involving cognitive function;Dizziness and giddiness (R42)   Activity Tolerance Patient tolerated  treatment well   Patient Left with call bell/phone within reach;in bed   Nurse Communication Mobility status        Time: 1610-9604 OT Time Calculation (min): 37 min  Charges: OT General Charges $OT Visit: 1 Visit OT Treatments $Self Care/Home Management : 23-37 mins  Zenovia Jarred, MSOT, OTR/L [facility] Office Number: [phone] Pager: [phone]  Zenovia Jarred 01/01/2020, 5:53 PM

## 2020-01-01 NOTE — Progress Notes (Signed)
PROGRESS NOTE                                                                                                                                                                                                             Patient Demographics:    Frank Love, is a 64 y.o. male, DOB - 1955/02/26, HWE:[id number]  Outpatient Primary MD for the patient is Frank Pounds, NP   Admit date - 12/13/2019   LOS - 1  Chief Complaint  Patient presents with  . Covid Exposure  . Shortness of Breath  . generalized weakness       Brief Narrative: Patient is a 64 y.o. male with PMHx of CAD s/p CABG, chronic systolic heart failure, carotid artery stenosis, penile cancer-who presented with shortness of breath-patient was found to have severe acute hypoxic respiratory failure due to COVID-19 pneumonia-requiring up to 30 L of HHFNC.  Hospital course with gradual improvement in his hypoxemia-however lately complicated by development of severe orthostatic hypotension and thrombocytopenia.  See below for further details.  COVID-19 vaccinated status: Unvaccinated  Significant Events: 11/19>> Admit to [facility] for severe hypoxia due to COVID-19 pneumonia  Significant studies: 11/19>>Chest x-ray: Multifocal pneumonia 11/21>> bilateral lower extremity Doppler ultrasound: No DVT 11/30>> Echo: EF 45-50% 12/2>> left foot x-ray: No fracture,/bone lesion.  COVID-19 medications: Steroids: 11/19>> 11/27, 11/29>> 12/1 Remdesivir: 11/19>> 11/24 Actemra: 11/20 x 1  Antibiotics: None  Microbiology data: 11/19 >>blood culture: No growth  Procedures: None  Consults: Cardiology  DVT prophylaxis: Place and maintain sequential compression device Start: 12/30/19 1113 Place TED hose Start: 12/25/19 1215    Subjective:    [name] today has no chest pain-continues to feel dizzy with ambulation.   Assessment  & Plan :   Acute  Hypoxic Resp Failure due to Covid 19 Viral pneumonia: Presented with severe hypoxemia-requiring 30 L of heated high flow-significantly improved after being treated with steroids/Remdesivir and 1 dose of Actemra.  Currently on room air.  Will complete isolation on 12/9.  Fever: afebrile O2 requirements:  SpO2: 97 % O2 Flow Rate (L/min): 1 L/min FiO2 (%): 36 %   COVID-19 Labs: Recent Labs    12/30/19 1106  DDIMER 1.02*  LDH 172       Component Value Date/Time   BNP 502.9 (H) 12/30/2019 0820    No results  for input(s): PROCALCITON in the last 168 hours.  Lab Results  Component Value Date   SARSCOV2NAA POSITIVE (A) 12/13/2019   [place] NEGATIVE 04/25/2019   [place] NEGATIVE 03/05/2019   [place] NEGATIVE 12/10/2018    Prone/Incentive Spirometry: encouraged incentive spirometry use 3-4/hour.  Orthostatic hypotension: Thought to be secondary to autonomic dysfunction from COVID infection-on midodrine 15 mg 3 times daily, TED hose-continues to be symptomatic (dizzy when he stood up this morning)-felt that it will improve slowly with time.  ACTH stimulation test was negative.  Patient spouse herself is admitted at [facility] not able to provide 24/supervision-have asked case management to see if patient is a CIR candidate (finishes isolation on 12/9)  Thrombocytopenia: Unclear etiology-could be related to COVID-19 infection-we will follow closely-May need to discuss with rheumatology over the phone if no improvement.  Transaminitis: Mild-slowly downtrending-follow.  Chronic combined systolic and diastolic heart failure: Euvolemic on exam-diuretics/CHF meds on hold due to severe orthostatic hypotension  CAD s/p CABG: No anginal symptoms-on aspirin/statin.  ?  Hyperthyroidism (mildly suppressed TSH, minimally elevated free T4): Currently asymptomatic-needs repeat thyroid function tests in a few weeks by PCP.  Prediabetes (A1c 6.1 on 11/20): CBGs as  below-do not think patient requires Lantus-continue SSI  Recent Labs    12/31/19 2119 01/01/20 0722 01/01/20 1156  GLUCAP 107* 94 96    ABG:    Component Value Date/Time   PHART 7.470 (H) 12/13/2019 2335   PCO2ART 22.4 (L) 12/13/2019 2335   PO2ART 118 (H) 12/13/2019 2335   HCO3 16.3 (L) 12/13/2019 2335   TCO2 17 (L) 12/13/2019 2335   ACIDBASEDEF 6.0 (H) 12/13/2019 2335   O2SAT 99.0 12/13/2019 2335    Vent Settings: N/A  Condition - Stable  Family Communication  :  Daughter [name] [phone]) updated over the phone on 12/8  Code Status :  Full Code  Diet :  Diet Order            Diet regular Room service appropriate? Yes; Fluid consistency: Thin  Diet effective now                  Disposition Plan  :   Status is: Inpatient  Remains inpatient appropriate because:Inpatient level of care appropriate due to severity of illness  Dispo:  Patient From: Home  Planned Disposition: TBD (HHPT vs CIR)  Expected discharge date: 01/06/20  Medically stable for discharge: No   Barriers to discharge:  Severe symptomatic orthostatic hypotension-unsafe disposition is family unable to provide 24/7 care (spouse hospitalized)  Antimicorbials  :    Anti-infectives (From admission, onward)   Start     Dose/Rate Route Frequency Ordered Stop   12/15/19 1000  remdesivir 100 mg in sodium chloride 0.9 % 100 mL IVPB       "Followed by" Linked Group Details   100 mg 200 mL/hr over 30 Minutes Intravenous Daily 12/14/19 0002 12/18/19 0849   12/14/19 1000  remdesivir 100 mg in sodium chloride 0.9 % 100 mL IVPB  Status:  Discontinued       "Followed by" Linked Group Details   100 mg 200 mL/hr over 30 Minutes Intravenous Daily 12/13/19 2358 12/14/19 0001   12/14/19 0030  remdesivir 100 mg in sodium chloride 0.9 % 100 mL IVPB       "Followed by" Linked Group Details   100 mg 200 mL/hr over 30 Minutes Intravenous Every 30 min 12/14/19 0002 12/14/19 0210   12/14/19 0000  remdesivir  200 mg in  sodium chloride 0.9% 250 mL IVPB  Status:  Discontinued       "Followed by" Linked Group Details   200 mg 580 mL/hr over 30 Minutes Intravenous Once 12/13/19 2358 12/14/19 0001   12/13/19 2245  vancomycin (VANCOREADY) IVPB 2000 mg/400 mL        2,000 mg 200 mL/hr over 120 Minutes Intravenous  Once 12/13/19 2241 12/14/19 0210   12/13/19 2245  ceFEPIme (MAXIPIME) 2 g in sodium chloride 0.9 % 100 mL IVPB        2 g 200 mL/hr over 30 Minutes Intravenous  Once 12/13/19 2241 12/14/19 0002      Inpatient Medications  Scheduled Meds: . vitamin C  500 mg Oral Daily  . aspirin EC  81 mg Oral QHS  . atorvastatin  40 mg Oral q1800  . bisacodyl  10 mg Oral Daily  . cholecalciferol  1,000 Units Oral Daily  . feeding supplement (GLUCERNA SHAKE)  237 mL Oral TID BM  . folic acid  1 mg Oral Daily  . insulin aspart  0-9 Units Subcutaneous TID WC  . insulin glargine  6 Units Subcutaneous Daily  . melatonin  10 mg Oral QHS  . midodrine  15 mg Oral TID WC  . multivitamin with minerals  1 tablet Oral Daily  . polyethylene glycol  17 g Oral Daily  . thiamine  100 mg Oral Daily   Continuous Infusions: PRN Meds:.acetaminophen, benzocaine, Ipratropium-Albuterol, metoCLOPramide (REGLAN) injection, nitroGLYCERIN, ondansetron (ZOFRAN) IV, sodium chloride   Time Spent in minutes  25  See all Orders from today for further details   Oren Binet M.D on 01/01/2020 at 1:47 PM  To page go to [email or web address] - use universal password  [facility] -  Office  [phone]    Objective:   Vitals:   12/31/19 2350 01/01/20 0407 01/01/20 0723 01/01/20 1157  BP: 120/71 (!) 96/56 (!) 105/53 (!) 109/44  Pulse: (!) 59 (!) 57 (!) 55 60  Resp: 20 19 20 18   Temp: 97.7 F (36.5 C) 97.7 F (36.5 C) 97.6 F (36.4 C) 97.6 F (36.4 C)  TempSrc: Oral Oral Oral   SpO2: 95% 95% 93% 97%  Weight:  105.6 kg    Height:        Wt Readings from Last 3 Encounters:  01/01/20 105.6 kg  09/10/19 114.8  kg  08/13/19 114.8 kg     Intake/Output Summary (Last 24 hours) at 01/01/2020 1347 Last data filed at 01/01/2020 0409 Gross per 24 hour  Intake 120 ml  Output --  Net 120 ml     Physical Exam Gen Exam:Alert awake-not in any distress HEENT:atraumatic, normocephalic Chest: B/L clear to auscultation anteriorly CVS:S1S2 regular Abdomen:soft non tender, non distended Extremities:no edema Neurology: Non focal Skin: no rash   Data Review:    CBC Recent Labs  Lab 12/26/19 0051 12/26/19 0051 12/27/19 0310 12/27/19 0310 12/28/19 0355 12/28/19 0355 12/29/19 0439 12/30/19 0348 12/30/19 1106 12/31/19 0601 01/01/20 0120  WBC 5.6   < > 4.3   < > 3.9*  --  3.4* 3.7*  --  3.6* 3.6*  HGB 11.1*   < > 11.5*   < > 11.2*  --  11.0* 11.0*  --  11.5* 11.5*  HCT 33.2*   < > 35.7*   < > 33.1*  --  33.3* 35.1*  --  34.7* 36.7*  PLT 103*   < > 92*   < > 86*   < > 85* 68* 70*  60* 60*  MCV 86.2   < > 87.9   < > 87.6  --  88.8 90.5  --  89.7 91.1  MCH 28.8   < > 28.3   < > 29.6  --  29.3 28.4  --  29.7 28.5  MCHC 33.4   < > 32.2   < > 33.8  --  33.0 31.3  --  33.1 31.3  RDW 15.2   < > 15.6*   < > 16.1*  --  16.6* 16.7*  --  16.9* 17.1*  LYMPHSABS 0.7  --  0.8  --   --   --   --   --   --   --   --   MONOABS 0.4  --  0.3  --   --   --   --   --   --   --   --   EOSABS 0.1  --  0.2  --   --   --   --   --   --   --   --   BASOSABS 0.0  --  0.0  --   --   --   --   --   --   --   --    < > = values in this interval not displayed.    Chemistries  Recent Labs  Lab 12/26/19 0051 12/26/19 0051 12/27/19 0310 12/27/19 0310 12/28/19 0355 12/29/19 0439 12/30/19 0348 12/31/19 0601 01/01/20 0120  NA 138   < > 140   < > 140 142 141 141 141  K 4.4   < > 4.2   < > 4.2 4.2 4.4 4.0 4.2  CL 105   < > 103   < > 104 105 106 107 108  CO2 27   < > 28   < > 29 29 29 26 24   GLUCOSE 97   < > 89   < > 93 92 94 95 98  BUN 36*   < > 28*   < > 26* 26* 25* 24* 28*  CREATININE 1.06   < > 1.18   < > 1.31*  1.31* 1.39* 1.20 1.17  CALCIUM 8.1*   < > 8.3*   < > 8.1* 8.0* 7.9* 8.3* 8.5*  MG 2.1  --  1.9  --   --   --   --   --   --   AST  --   --   --   --   --   --  51* 37 44*  ALT  --   --   --   --   --   --  114* 93* 91*  ALKPHOS  --   --   --   --   --   --  36* 36* 37*  BILITOT  --   --   --   --   --   --  1.7* 1.6* 1.4*   < > = values in this interval not displayed.   ------------------------------------------------------------------------------------------------------------------ No results for input(s): CHOL, HDL, LDLCALC, TRIG, CHOLHDL, LDLDIRECT in the last 72 hours.  Lab Results  Component Value Date   HGBA1C 6.1 (H) 12/14/2019   ------------------------------------------------------------------------------------------------------------------ No results for input(s): TSH, T4TOTAL, T3FREE, THYROIDAB in the last 72 hours.  Invalid input(s): FREET3 ------------------------------------------------------------------------------------------------------------------ No results for input(s): VITAMINB12, FOLATE, FERRITIN, TIBC, IRON, RETICCTPCT in the last 72 hours.  Coagulation profile Recent Labs  Lab 12/30/19 1106  INR 1.1  Recent Labs    12/30/19 1106  DDIMER 1.02*    Cardiac Enzymes No results for input(s): CKMB, TROPONINI, MYOGLOBIN in the last 168 hours.  Invalid input(s): CK ------------------------------------------------------------------------------------------------------------------    Component Value Date/Time   BNP 502.9 (H) 12/30/2019 0820    Micro Results No results found for this or any previous visit (from the past 240 hour(s)).  Radiology Reports US Abdomen Complete  Result Date: 12/30/2019 CLINICAL DATA:  Transaminitis.  Evaluate for splenomegaly. EXAM: ABDOMEN ULTRASOUND COMPLETE COMPARISON:  CT 04/23/2019 FINDINGS: Gallbladder: No gallstones or wall thickening visualized. No sonographic Murphy sign noted by sonographer. Common bile duct:  Diameter: Normal caliber, 4 mm Liver: No focal lesion identified. Within normal limits in parenchymal echogenicity. Portal vein is patent on color Doppler imaging with normal direction of blood flow towards the liver. IVC: No abnormality visualized. Pancreas: Visualized portion unremarkable. Spleen: Normal size, 8.9 cm in craniocaudal length. No focal abnormality. Right Kidney: Length: 12.2 cm. Echogenicity within normal limits. No mass or hydronephrosis visualized. Left Kidney: Length: 12.6 cm. Echogenicity within normal limits. No mass or hydronephrosis visualized. Abdominal aorta: No aneurysm visualized. Other findings: None. IMPRESSION: No evidence of splenomegaly. No acute findings. Electronically Signed   By: Rolm Baptise M.D.   On: 12/30/2019 13:45   DG Chest Portable 1 View  Result Date: 12/13/2019 CLINICAL DATA:  Shortness of breath, suspicion of COVID EXAM: PORTABLE CHEST 1 VIEW COMPARISON:  Radiograph 01/07/2019, CT 11/20/2018 FINDINGS: Heterogeneous consolidative and hazy opacity is present in a lower lung and peripheral predominance most coalescent in the left lung periphery. Some more bandlike opacities, possibly subsegmental atelectatic changes or scarring. No pneumothorax. No visible effusion though the costophrenic sulci are partially collimated. Postsurgical changes related to prior CABG including intact and aligned sternotomy wires and multiple surgical clips projecting over the mediastinum. Telemetry leads overlie the chest. The aorta is calcified. The remaining cardiomediastinal contours are unremarkable. Degenerative changes are present in the imaged spine and shoulders. IMPRESSION: 1. Heterogeneous consolidative and hazy opacity in a lower lung and peripheral predominance most coalescent in the left lung periphery, compatible with a multifocal pneumonia including atypical viral etiology in the appropriate clinical setting. 2. More bandlike opacities likely reflect subsegmental atelectatic  changes or scarring. Electronically Signed   By: Lovena Le M.D.   On: 12/13/2019 22:28   DG Foot 2 Views Left  Result Date: 12/28/2019 CLINICAL DATA:  Left foot pain. EXAM: LEFT FOOT - 2 VIEW COMPARISON:  None. FINDINGS: No fracture or bone lesion. Joints are normally spaced and aligned. No significant arthropathic change. Tiny plantar and small to moderate dorsal calcaneal spurs. Nonspecific forefoot soft tissue swelling. IMPRESSION: 1. No fracture, joint abnormality or bone lesion. 2. Nonspecific soft tissue swelling.  Calcaneal spurs. Electronically Signed   By: Lajean Manes M.D.   On: 12/28/2019 13:49   ECHOCARDIOGRAM COMPLETE  Result Date: 12/24/2019    ECHOCARDIOGRAM REPORT   Patient Name:   ELLIOTT LASECKI Date of Exam: 12/24/2019 Medical Rec #:  [id number]          Height:       74.0 in Accession #:    [id number]         Weight:       247.6 lb Date of Birth:  04/06/1955          BSA:          2.380 m Patient Age:    52 years  BP:           102/54 mmHg Patient Gender: M                  HR:           61 bpm. Exam Location:  Inpatient Procedure: 2D Echo, Cardiac Doppler and Color Doppler Indications:    X72.62 Chronic systolic (congestive) heart failure  History:        Patient has prior history of Echocardiogram examinations, most                 recent 04/18/2019. COVID-19 Positive.  Sonographer:    Jonelle Sidle Dance Referring Phys: [id number] Carlene Coria  Sonographer Comments: No subcostal window. IMPRESSIONS  1. Left ventricular ejection fraction, by estimation, is 45 to 50%. The left ventricle has mildly decreased function. The left ventricle demonstrates global hypokinesis. The left ventricular internal cavity size was mildly dilated. There is mild left ventricular hypertrophy. Left ventricular diastolic parameters are indeterminate.  2. Right ventricular systolic function is mildly reduced. The right ventricular size is normal.  3. Left atrial size was moderately dilated.  4. The  mitral valve is grossly normal. Trivial mitral valve regurgitation. No evidence of mitral stenosis.  5. The aortic valve is grossly normal. Aortic valve regurgitation is trivial. No aortic stenosis is present. Comparison(s): Prior images reviewed side by side. Changes from prior study are noted. Conclusion(s)/Recommendation(s): No hemodynamically significant valvular heart disease. LV function appears improved when directly compared to prior. FINDINGS  Left Ventricle: Left ventricular ejection fraction, by estimation, is 45 to 50%. The left ventricle has mildly decreased function. The left ventricle demonstrates global hypokinesis. The left ventricular internal cavity size was mildly dilated. There is  mild left ventricular hypertrophy. Left ventricular diastolic parameters are indeterminate. Right Ventricle: The right ventricular size is normal. Right vetricular wall thickness was not well visualized. Right ventricular systolic function is mildly reduced. Left Atrium: Left atrial size was moderately dilated. Right Atrium: Right atrial size was normal in size. Pericardium: There is no evidence of pericardial effusion. Mitral Valve: The mitral valve is grossly normal. Trivial mitral valve regurgitation. No evidence of mitral valve stenosis. Tricuspid Valve: The tricuspid valve is normal in structure. Tricuspid valve regurgitation is trivial. Aortic Valve: The aortic valve is grossly normal. Aortic valve regurgitation is trivial. Aortic regurgitation PHT measures 521 msec. No aortic stenosis is present. Pulmonic Valve: The pulmonic valve was not well visualized. Pulmonic valve regurgitation is mild. No evidence of pulmonic stenosis. Aorta: The aortic root and ascending aorta are structurally normal, with no evidence of dilitation and the aortic arch was not well visualized. Venous: The inferior vena cava was not well visualized. IAS/Shunts: The atrial septum is grossly normal.  LEFT VENTRICLE PLAX 2D LVIDd:          5.65 cm  Diastology LVIDs:         4.13 cm  LV e' medial:    6.09 cm/s LV PW:         1.40 cm  LV E/e' medial:  11.8 LV IVS:        1.12 cm  LV e' lateral:   7.51 cm/s LVOT diam:     2.10 cm  LV E/e' lateral: 9.6 LV SV:         74 LV SV Index:   31 LVOT Area:     3.46 cm  RIGHT VENTRICLE RV Basal diam:  2.96 cm RV S prime:  7.40 cm/s TAPSE (M-mode): 1.4 cm LEFT ATRIUM              Index       RIGHT ATRIUM           Index LA diam:        5.50 cm  2.31 cm/m  RA Area:     13.80 cm LA Vol (A2C):   114.0 ml 47.90 ml/m RA Volume:   28.40 ml  11.93 ml/m LA Vol (A4C):   87.6 ml  36.81 ml/m LA Biplane Vol: 101.0 ml 42.44 ml/m  AORTIC VALVE LVOT Vmax:   85.00 cm/s LVOT Vmean:  60.800 cm/s LVOT VTI:    0.213 m AI PHT:      521 msec  AORTA Ao Root diam: 3.50 cm Ao Asc diam:  3.70 cm MITRAL VALVE MV Area (PHT): 2.54 cm    SHUNTS MV Decel Time: 299 msec    Systemic VTI:  0.21 m MV E velocity: 71.80 cm/s  Systemic Diam: 2.10 cm MV A velocity: 81.50 cm/s MV E/A ratio:  0.88 Buford Dresser MD Electronically signed by Buford Dresser MD Signature Date/Time: 12/24/2019/12:01:13 PM    Final    VAS [place] LOWER EXTREMITY VENOUS (DVT)  Result Date: 12/15/2019  Lower Venous DVT Study Indications: Elevated ddimer.  Comparison Study: 07/09/18 previous Performing Technologist: Abram Sander RVS  Examination Guidelines: A complete evaluation includes B-mode imaging, spectral Doppler, color Doppler, and power Doppler as needed of all accessible portions of each vessel. Bilateral testing is considered an integral part of a complete examination. Limited examinations for reoccurring indications may be performed as noted. The reflux portion of the exam is performed with the patient in reverse Trendelenburg.  +---------+---------------+---------+-----------+----------+--------------+ RIGHT    CompressibilityPhasicitySpontaneityPropertiesThrombus Aging  +---------+---------------+---------+-----------+----------+--------------+ CFV      Full           Yes      Yes                                 +---------+---------------+---------+-----------+----------+--------------+ SFJ      Full                                                        +---------+---------------+---------+-----------+----------+--------------+ FV Prox  Full                                                        +---------+---------------+---------+-----------+----------+--------------+ FV Mid   Full                                                        +---------+---------------+---------+-----------+----------+--------------+ FV DistalFull                                                        +---------+---------------+---------+-----------+----------+--------------+  PFV      Full                                                        +---------+---------------+---------+-----------+----------+--------------+ POP      Full           Yes      Yes                                 +---------+---------------+---------+-----------+----------+--------------+ PTV      Full                                                        +---------+---------------+---------+-----------+----------+--------------+ PERO     Full                                                        +---------+---------------+---------+-----------+----------+--------------+   +---------+---------------+---------+-----------+----------+--------------+ LEFT     CompressibilityPhasicitySpontaneityPropertiesThrombus Aging +---------+---------------+---------+-----------+----------+--------------+ CFV      Full           Yes      Yes                                 +---------+---------------+---------+-----------+----------+--------------+ SFJ      Full                                                         +---------+---------------+---------+-----------+----------+--------------+ FV Prox  Full                                                        +---------+---------------+---------+-----------+----------+--------------+ FV Mid   Full                                                        +---------+---------------+---------+-----------+----------+--------------+ FV DistalFull                                                        +---------+---------------+---------+-----------+----------+--------------+ PFV      Full                                                        +---------+---------------+---------+-----------+----------+--------------+  POP      Full           Yes      Yes                                 +---------+---------------+---------+-----------+----------+--------------+ PTV      Full                                                        +---------+---------------+---------+-----------+----------+--------------+ PERO     Full                                                        +---------+---------------+---------+-----------+----------+--------------+     Summary: BILATERAL: - No evidence of deep vein thrombosis seen in the lower extremities, bilaterally. - No evidence of superficial venous thrombosis in the lower extremities, bilaterally. -No evidence of popliteal cyst, bilaterally. RIGHT: - Findings appear essentially unchanged compared to previous examination.   *See table(s) above for measurements and observations. Electronically signed by Deitra Mayo MD on 12/15/2019 at 6:22:41 PM.    Final

## 2020-01-01 NOTE — TOC Progression Note (Signed)
Transition of Care [facility]) - Progression Note    Patient Details  Name: Frank Love MRN: [id number] Date of Birth: 1955-05-28  Transition of Care [facility] CM/SW [place], LCSW Phone Number: 01/01/2020, 3:20 PM  Clinical Narrative:    CSW received request from MD to screen patient for CIR. CSW requested CIR review referral, as patient to come off of isolation tomorrow.    Expected Discharge Plan: Home/Self Care Barriers to Discharge: Continued Medical Work up, Inadequate or no insurance  Expected Discharge Plan and Services Expected Discharge Plan: Home/Self Care   Discharge Planning Services: CM Consult, Medication Assistance     Expected Discharge Date: 12/23/19               DME Arranged: [name] rolling, Oxygen DME Agency: [employer] Date DME Agency Contacted: 12/22/19   Representative spoke with at DME Agency: Peggye [employer]--charity             Social Determinants of Health ([place]) Interventions    Readmission Risk Interventions Readmission Risk Prevention Plan 12/21/2018  Transportation Screening (No Data)  Some recent data might be hidden

## 2020-01-02 ENCOUNTER — Other Ambulatory Visit: Payer: Self-pay | Admitting: Hematology

## 2020-01-02 DIAGNOSIS — D696 Thrombocytopenia, unspecified: Secondary | ICD-10-CM

## 2020-01-02 LAB — GLUCOSE, CAPILLARY
Glucose-Capillary: 86 mg/dL (ref 70–99)
Glucose-Capillary: 90 mg/dL (ref 70–99)
Glucose-Capillary: 120 mg/dL — ABNORMAL HIGH (ref 70–99)
Glucose-Capillary: 133 mg/dL — ABNORMAL HIGH (ref 70–99)

## 2020-01-02 NOTE — Progress Notes (Signed)
Physical Therapy Treatment Patient Details Name: CHAISE MAHABIR MRN: [id number] DOB: 1955-06-14 Today's Date: 01/02/2020    History of Present Illness 64 y.o. male Admitted 11/19/2 with medical history significant for chronic systolic CHF, ischemic cardiomyopathy with LVEF 35 to 40%, coronary artery disease status post CABG, penile cancer, carotid artery stenosis, who presented to [facility] via EMS after being found severely hypoxic at home. In the ED, he required nonrebreather and high flow nasal cannula to maintain O2 saturation greater than 92%.  Work-up in the ED revealed severe acute hypoxic respiratory failure secondary to COVID-19 viral pneumonia. Unvaccinated. 12/23/19 discharge cancelled due to onset orthostatic hypotension.    PT Comments    Patient's BP with best response during standing exercises/walking today (with thigh-high TEDs and abd binder), however he was limited by generally feeling unwell. Described it as "head nausea" and denied headache or need for nausea meds. HR and sats stable on room air, however pt unable to ambulate as far a distance as last session. RN made aware.    Follow Up Recommendations  Home health PT;Supervision/Assistance - 24 hour (pt has arranged for daughter to stay wiht him and wife)     Equipment Recommendations  Rolling walker with 5" wheels    Recommendations for Other Services       Precautions / Restrictions Precautions Precautions: Other (comment) Precaution Comments: orthostasis Required Braces or Orthoses: Other Brace (abdominal binder and thigh high TEDS)    Mobility  Bed Mobility                  Transfers Overall transfer level: Needs assistance Equipment used: Rolling walker (2 wheeled) Transfers: Sit to/from Stand Sit to Stand: Supervision         General transfer comment: at least 7 reps throughout session; pt recalled proper hand placement each time; supervision for safety as pt not feeling well (although BPs  were better than prior days)  Ambulation/Gait Ambulation/Gait assistance: Min guard Gait Distance (Feet): 100 Feet (seated rest, 100) Assistive device: Rolling walker (2 wheeled) Gait Pattern/deviations: Step-through pattern;Decreased stride length;Trunk flexed Gait velocity: decr   General Gait Details: after single round of seated and single round of standing exercises to incr BP prior to ambulation (with TED hose and abd binder)   Stairs             Wheelchair Mobility    Modified Rankin (Stroke Patients Only)       Balance Overall balance assessment: Needs assistance Sitting-balance support: Feet supported Sitting balance-Leahy Scale: Good       Standing balance-Leahy Scale: Poor Standing balance comment: no UE support, but wide BOS and guarded posture                            Cognition Arousal/Alertness: Awake/alert Behavior During Therapy: WFL for tasks assessed/performed Overall Cognitive Status: Impaired/Different from baseline Area of Impairment: Awareness;Safety/judgement;Problem solving                       Following Commands: Follows multi-step commands with increased time Safety/Judgement: Decreased awareness of safety;Decreased awareness of deficits Awareness: Emergent Problem Solving: Slow processing;Requires verbal cues General Comments: continues to require cues and redirection for poor safety awareness. Slow processing. Pt seems to be becoming more aware of this      Exercises Other Exercises Other Exercises: seated heel raises Other Exercises: counterpressure exercises with BUE and BLE Other Exercises: Standing marching Other Exercises:  overhead reaching    General Comments General comments (skin integrity, edema, etc.): Dr Darnell Level in during session and discussed his orthostasis with BPs checked while he was present. Pt unable to don his TED hose himself and Dr Darnell Level told him OK to use his compression socks he has at home  that he is able to don      Pertinent Vitals/Pain Pain Assessment: No/denies pain    Home Living                      Prior Function            PT Goals (current goals can now be found in the care plan section) Acute Rehab PT Goals Patient Stated Goal: to go home PT Goal Formulation: With patient Time For Goal Achievement: 01/13/20 Potential to Achieve Goals: Good Progress towards PT goals: Progressing toward goals    Frequency    Min 3X/week      PT Plan Current plan remains appropriate (if orthostasis can be managed and has 24/7 supervision )    Co-evaluation              AM-PAC PT "6 Clicks" Mobility   Outcome Measure  Help needed turning from your back to your side while in a flat bed without using bedrails?: None Help needed moving from lying on your back to sitting on the side of a flat bed without using bedrails?: None Help needed moving to and from a bed to a chair (including a wheelchair)?: A Little Help needed standing up from a chair using your arms (e.g., wheelchair or bedside chair)?: A Little Help needed to walk in hospital room?: A Little Help needed climbing 3-5 steps with a railing? : A Little 6 Click Score: 20    End of Session Equipment Utilized During Treatment: Gait belt Activity Tolerance: Patient limited by fatigue (generally not feeling well) Patient left: with call bell/phone within reach;in chair Nurse Communication: Other (comment) (needs exercises, TED, abd binder  prior to walking) PT Visit Diagnosis: Other abnormalities of gait and mobility (R26.89);Muscle weakness (generalized) (M62.81);Difficulty in walking, not elsewhere classified (R26.2)     Time: 9407-6808 PT Time Calculation (min) (ACUTE ONLY): 55 min  Charges:  $Gait Training: 8-22 mins $Therapeutic Exercise: 23-37 mins $Self Care/Home Management: 8-22                      [name], PT Pager [phone]    [name] 01/02/2020, 10:51 AM

## 2020-01-02 NOTE — Progress Notes (Signed)
PROGRESS NOTE                                                                                                                                                                                                             Patient Demographics:    Frank Love, is a 64 y.o. male, DOB - 11-Jun-1955, QRF:[id number]  Outpatient Primary MD for the patient is [name], NP   Admit date - 12/13/2019   LOS - 63  Chief Complaint  Patient presents with  . Covid Exposure  . Shortness of Breath  . generalized weakness       Brief Narrative: Patient is a 64 y.o. male with PMHx of CAD s/p CABG, chronic systolic heart failure, carotid artery stenosis, penile cancer-who presented with shortness of breath-patient was found to have severe acute hypoxic respiratory failure due to COVID-19 pneumonia-requiring up to 30 L of HHFNC.  Hospital course with gradual improvement in his hypoxemia-however lately complicated by development of severe orthostatic hypotension and thrombocytopenia.  See below for further details.  COVID-19 vaccinated status: Unvaccinated  Significant Events: 11/19>> Admit to [facility] for severe hypoxia due to COVID-19 pneumonia  Significant studies: 11/19>>Chest x-ray: Multifocal pneumonia 11/21>> bilateral lower extremity Doppler ultrasound: No DVT 11/30>> Echo: EF 45-50% 12/2>> left foot x-ray: No fracture,/bone lesion.  COVID-19 medications: Steroids: 11/19>> 11/27, 11/29>> 12/1 Remdesivir: 11/19>> 11/24 Actemra: 11/20 x 1  Antibiotics: None  Microbiology data: 11/19 >>blood culture: No growth  Procedures: None  Consults: Cardiology  DVT prophylaxis: Place and maintain sequential compression device Start: 12/30/19 1113 Place TED hose Start: 12/25/19 1215    Subjective:   Complains of some mild nausea at times-he was able to stand up without dizziness.  With the physical therapist in the  room-we will check orthostatic vital signs-his SBP while sitting was 109-and understanding was 96.  Patient acknowledged that his dizziness/unsteady gait has remarkably improved over the past few days.   Assessment  & Plan :   Acute Hypoxic Resp Failure due to Covid 19 Viral pneumonia: Presented with severe hypoxemia-requiring 30 L of heated high flow-significantly improved after being treated with steroids/Remdesivir and 1 dose of Actemra.  Currently on room air.  Will complete isolation on 12/9.  Fever: afebrile O2 requirements:  SpO2: 95 % O2 Flow Rate (L/min): 1 L/min FiO2 (%): 36 %  COVID-19 Labs: No results for input(s): DDIMER, FERRITIN, LDH, CRP in the last 72 hours.     Component Value Date/Time   BNP 502.9 (H) 12/30/2019 0820    No results for input(s): PROCALCITON in the last 168 hours.  Lab Results  Component Value Date   SARSCOV2NAA POSITIVE (A) 12/13/2019   [place] NEGATIVE 04/25/2019   [place] NEGATIVE 03/05/2019   [place] NEGATIVE 12/10/2018    Prone/Incentive Spirometry: encouraged incentive spirometry use 3-4/hour.  Orthostatic hypotension: Thought to be secondary to autonomic dysfunction from COVID infection-on midodrine 15 mg 3 times daily, TED hose-improving-less symptomatic than the past few days.  In fact when he stood up this morning-he hardly had any dizziness.  Continue to hold all diuretics and CHF medications-continue midodrine and TED stockings.  Plan is to discharge tomorrow with maximum home health services.  Thrombocytopenia: Unclear etiology-could be related to COVID-19 infection/Actemra use-Case discussed with oncology-Dr. Arletha Grippe reviewed-she will arrange for outpatient follow-up/repeat labs in the next 1-2 weeks.    Transaminitis: Mild-slowly downtrending-follow.  Chronic combined systolic and diastolic heart failure: Euvolemic on exam-diuretics/CHF meds on hold due to severe orthostatic hypotension  CAD s/p CABG: No anginal  symptoms-on aspirin/statin.  ?  Hyperthyroidism (mildly suppressed TSH, minimally elevated free T4): Currently asymptomatic-needs repeat thyroid function tests in a few weeks by PCP.  Prediabetes (A1c 6.1 on 11/20): CBGs as below-do not think patient requires Lantus-continue SSI  Recent Labs    01/01/20 2051 01/02/20 0714 01/02/20 1223  GLUCAP 131* 86 90    ABG:    Component Value Date/Time   PHART 7.470 (H) 12/13/2019 2335   PCO2ART 22.4 (L) 12/13/2019 2335   PO2ART 118 (H) 12/13/2019 2335   HCO3 16.3 (L) 12/13/2019 2335   TCO2 17 (L) 12/13/2019 2335   ACIDBASEDEF 6.0 (H) 12/13/2019 2335   O2SAT 99.0 12/13/2019 2335    Vent Settings: N/A  Condition - Stable  Family Communication  :  Daughter [name] [phone]) updated over the phone on 12/9  Code Status :  Full Code  Diet :  Diet Order            Diet regular Room service appropriate? Yes; Fluid consistency: Thin  Diet effective now                  Disposition Plan  :   Status is: Inpatient  Remains inpatient appropriate because:Inpatient level of care appropriate due to severity of illness  Dispo:  Patient From: Home  Planned Disposition: HHPT   Expected discharge date: 01/03/2020  Medically stable for discharge: No   Barriers to discharge: Resolving orthostatic hypotension-possible discharge on 12/10 if clinical improved  Antimicorbials  :    Anti-infectives (From admission, onward)   Start     Dose/Rate Route Frequency Ordered Stop   12/15/19 1000  remdesivir 100 mg in sodium chloride 0.9 % 100 mL IVPB       "Followed by" Linked Group Details   100 mg 200 mL/hr over 30 Minutes Intravenous Daily 12/14/19 0002 12/18/19 0849   12/14/19 1000  remdesivir 100 mg in sodium chloride 0.9 % 100 mL IVPB  Status:  Discontinued       "Followed by" Linked Group Details   100 mg 200 mL/hr over 30 Minutes Intravenous Daily 12/13/19 2358 12/14/19 0001   12/14/19 0030  remdesivir 100 mg in sodium chloride  0.9 % 100 mL IVPB       "Followed by" Linked Group Details   100 mg 200 mL/hr  over 30 Minutes Intravenous Every 30 min 12/14/19 0002 12/14/19 0210   12/14/19 0000  remdesivir 200 mg in sodium chloride 0.9% 250 mL IVPB  Status:  Discontinued       "Followed by" Linked Group Details   200 mg 580 mL/hr over 30 Minutes Intravenous Once 12/13/19 2358 12/14/19 0001   12/13/19 2245  vancomycin (VANCOREADY) IVPB 2000 mg/400 mL        2,000 mg 200 mL/hr over 120 Minutes Intravenous  Once 12/13/19 2241 12/14/19 0210   12/13/19 2245  ceFEPIme (MAXIPIME) 2 g in sodium chloride 0.9 % 100 mL IVPB        2 g 200 mL/hr over 30 Minutes Intravenous  Once 12/13/19 2241 12/14/19 0002      Inpatient Medications  Scheduled Meds: . vitamin C  500 mg Oral Daily  . aspirin EC  81 mg Oral QHS  . atorvastatin  40 mg Oral q1800  . bisacodyl  10 mg Oral Daily  . cholecalciferol  1,000 Units Oral Daily  . feeding supplement (GLUCERNA SHAKE)  237 mL Oral TID BM  . folic acid  1 mg Oral Daily  . insulin aspart  0-9 Units Subcutaneous TID WC  . melatonin  10 mg Oral QHS  . midodrine  15 mg Oral TID WC  . multivitamin with minerals  1 tablet Oral Daily  . polyethylene glycol  17 g Oral Daily  . thiamine  100 mg Oral Daily   Continuous Infusions: PRN Meds:.acetaminophen, benzocaine, Ipratropium-Albuterol, metoCLOPramide (REGLAN) injection, nitroGLYCERIN, ondansetron (ZOFRAN) IV, sodium chloride   Time Spent in minutes  25  See all Orders from today for further details   Oren Binet M.D on 01/02/2020 at 2:26 PM  To page go to [email or web address] - use universal password  [facility] -  Office  [phone]    Objective:   Vitals:   01/02/20 1000 01/02/20 1002 01/02/20 1008 01/02/20 1226  BP: 100/72 95/67 103/70 117/65  Pulse:    (!) 54  Resp:    20  Temp:    97.8 F (36.6 C)  TempSrc:    Oral  SpO2:    95%  Weight:      Height:        Wt Readings from Last 3 Encounters:  01/02/20  107.6 kg  09/10/19 114.8 kg  08/13/19 114.8 kg     Intake/Output Summary (Last 24 hours) at 01/02/2020 1426 Last data filed at 01/02/2020 0815 Gross per 24 hour  Intake 240 ml  Output --  Net 240 ml     Physical Exam Gen Exam:Alert awake-not in any distress HEENT:atraumatic, normocephalic Chest: B/L clear to auscultation anteriorly CVS:S1S2 regular Abdomen:soft non tender, non distended Extremities:no edema Neurology: Non focal Skin: no rash   Data Review:    CBC Recent Labs  Lab 12/27/19 0310 12/28/19 0355 12/29/19 0439 12/30/19 0348 12/30/19 1106 12/31/19 0601 01/01/20 0120  WBC 4.3 3.9* 3.4* 3.7*  --  3.6* 3.6*  HGB 11.5* 11.2* 11.0* 11.0*  --  11.5* 11.5*  HCT 35.7* 33.1* 33.3* 35.1*  --  34.7* 36.7*  PLT 92* 86* 85* 68* 70* 60* 60*  MCV 87.9 87.6 88.8 90.5  --  89.7 91.1  MCH 28.3 29.6 29.3 28.4  --  29.7 28.5  MCHC 32.2 33.8 33.0 31.3  --  33.1 31.3  RDW 15.6* 16.1* 16.6* 16.7*  --  16.9* 17.1*  LYMPHSABS 0.8  --   --   --   --   --   --  MONOABS 0.3  --   --   --   --   --   --   EOSABS 0.2  --   --   --   --   --   --   BASOSABS 0.0  --   --   --   --   --   --     Chemistries  Recent Labs  Lab 12/27/19 0310 12/28/19 0355 12/29/19 0439 12/30/19 0348 12/31/19 0601 01/01/20 0120  NA 140 140 142 141 141 141  K 4.2 4.2 4.2 4.4 4.0 4.2  CL 103 104 105 106 107 108  CO2 28 29 29 29 26 24   GLUCOSE 89 93 92 94 95 98  BUN 28* 26* 26* 25* 24* 28*  CREATININE 1.18 1.31* 1.31* 1.39* 1.20 1.17  CALCIUM 8.3* 8.1* 8.0* 7.9* 8.3* 8.5*  MG 1.9  --   --   --   --   --   AST  --   --   --  51* 37 44*  ALT  --   --   --  114* 93* 91*  ALKPHOS  --   --   --  36* 36* 37*  BILITOT  --   --   --  1.7* 1.6* 1.4*   ------------------------------------------------------------------------------------------------------------------ No results for input(s): CHOL, HDL, LDLCALC, TRIG, CHOLHDL, LDLDIRECT in the last 72 hours.  Lab Results  Component Value Date    HGBA1C 6.1 (H) 12/14/2019   ------------------------------------------------------------------------------------------------------------------ No results for input(s): TSH, T4TOTAL, T3FREE, THYROIDAB in the last 72 hours.  Invalid input(s): FREET3 ------------------------------------------------------------------------------------------------------------------ No results for input(s): VITAMINB12, FOLATE, FERRITIN, TIBC, IRON, RETICCTPCT in the last 72 hours.  Coagulation profile Recent Labs  Lab 12/30/19 1106  INR 1.1    No results for input(s): DDIMER in the last 72 hours.  Cardiac Enzymes No results for input(s): CKMB, TROPONINI, MYOGLOBIN in the last 168 hours.  Invalid input(s): CK ------------------------------------------------------------------------------------------------------------------    Component Value Date/Time   BNP 502.9 (H) 12/30/2019 0820    Micro Results No results found for this or any previous visit (from the past 240 hour(s)).  Radiology Reports US Abdomen Complete  Result Date: 12/30/2019 CLINICAL DATA:  Transaminitis.  Evaluate for splenomegaly. EXAM: ABDOMEN ULTRASOUND COMPLETE COMPARISON:  CT 04/23/2019 FINDINGS: Gallbladder: No gallstones or wall thickening visualized. No sonographic Murphy sign noted by sonographer. Common bile duct: Diameter: Normal caliber, 4 mm Liver: No focal lesion identified. Within normal limits in parenchymal echogenicity. Portal vein is patent on color Doppler imaging with normal direction of blood flow towards the liver. IVC: No abnormality visualized. Pancreas: Visualized portion unremarkable. Spleen: Normal size, 8.9 cm in craniocaudal length. No focal abnormality. Right Kidney: Length: 12.2 cm. Echogenicity within normal limits. No mass or hydronephrosis visualized. Left Kidney: Length: 12.6 cm. Echogenicity within normal limits. No mass or hydronephrosis visualized. Abdominal aorta: No aneurysm visualized. Other findings:  None. IMPRESSION: No evidence of splenomegaly. No acute findings. Electronically Signed   By: Rolm Baptise M.D.   On: 12/30/2019 13:45   DG Chest Portable 1 View  Result Date: 12/13/2019 CLINICAL DATA:  Shortness of breath, suspicion of COVID EXAM: PORTABLE CHEST 1 VIEW COMPARISON:  Radiograph 01/07/2019, CT 11/20/2018 FINDINGS: Heterogeneous consolidative and hazy opacity is present in a lower lung and peripheral predominance most coalescent in the left lung periphery. Some more bandlike opacities, possibly subsegmental atelectatic changes or scarring. No pneumothorax. No visible effusion though the costophrenic sulci are partially collimated. Postsurgical changes related to  prior CABG including intact and aligned sternotomy wires and multiple surgical clips projecting over the mediastinum. Telemetry leads overlie the chest. The aorta is calcified. The remaining cardiomediastinal contours are unremarkable. Degenerative changes are present in the imaged spine and shoulders. IMPRESSION: 1. Heterogeneous consolidative and hazy opacity in a lower lung and peripheral predominance most coalescent in the left lung periphery, compatible with a multifocal pneumonia including atypical viral etiology in the appropriate clinical setting. 2. More bandlike opacities likely reflect subsegmental atelectatic changes or scarring. Electronically Signed   By: Lovena Le M.D.   On: 12/13/2019 22:28   DG Foot 2 Views Left  Result Date: 12/28/2019 CLINICAL DATA:  Left foot pain. EXAM: LEFT FOOT - 2 VIEW COMPARISON:  None. FINDINGS: No fracture or bone lesion. Joints are normally spaced and aligned. No significant arthropathic change. Tiny plantar and small to moderate dorsal calcaneal spurs. Nonspecific forefoot soft tissue swelling. IMPRESSION: 1. No fracture, joint abnormality or bone lesion. 2. Nonspecific soft tissue swelling.  Calcaneal spurs. Electronically Signed   By: Lajean Manes M.D.   On: 12/28/2019 13:49    ECHOCARDIOGRAM COMPLETE  Result Date: 12/24/2019    ECHOCARDIOGRAM REPORT   Patient Name:   AHMON TOSI Date of Exam: 12/24/2019 Medical Rec #:  [id number]          Height:       74.0 in Accession #:    [id number]         Weight:       247.6 lb Date of Birth:  1955-11-23          BSA:          2.380 m Patient Age:    17 years           BP:           102/54 mmHg Patient Gender: M                  HR:           61 bpm. Exam Location:  Inpatient Procedure: 2D Echo, Cardiac Doppler and Color Doppler Indications:    K95.74 Chronic systolic (congestive) heart failure  History:        Patient has prior history of Echocardiogram examinations, most                 recent 04/18/2019. COVID-19 Positive.  Sonographer:    Jonelle Sidle Dance Referring Phys: [id number] Carlene Coria  Sonographer Comments: No subcostal window. IMPRESSIONS  1. Left ventricular ejection fraction, by estimation, is 45 to 50%. The left ventricle has mildly decreased function. The left ventricle demonstrates global hypokinesis. The left ventricular internal cavity size was mildly dilated. There is mild left ventricular hypertrophy. Left ventricular diastolic parameters are indeterminate.  2. Right ventricular systolic function is mildly reduced. The right ventricular size is normal.  3. Left atrial size was moderately dilated.  4. The mitral valve is grossly normal. Trivial mitral valve regurgitation. No evidence of mitral stenosis.  5. The aortic valve is grossly normal. Aortic valve regurgitation is trivial. No aortic stenosis is present. Comparison(s): Prior images reviewed side by side. Changes from prior study are noted. Conclusion(s)/Recommendation(s): No hemodynamically significant valvular heart disease. LV function appears improved when directly compared to prior. FINDINGS  Left Ventricle: Left ventricular ejection fraction, by estimation, is 45 to 50%. The left ventricle has mildly decreased function. The left ventricle demonstrates  global hypokinesis. The left ventricular internal cavity size was mildly dilated. There  is  mild left ventricular hypertrophy. Left ventricular diastolic parameters are indeterminate. Right Ventricle: The right ventricular size is normal. Right vetricular wall thickness was not well visualized. Right ventricular systolic function is mildly reduced. Left Atrium: Left atrial size was moderately dilated. Right Atrium: Right atrial size was normal in size. Pericardium: There is no evidence of pericardial effusion. Mitral Valve: The mitral valve is grossly normal. Trivial mitral valve regurgitation. No evidence of mitral valve stenosis. Tricuspid Valve: The tricuspid valve is normal in structure. Tricuspid valve regurgitation is trivial. Aortic Valve: The aortic valve is grossly normal. Aortic valve regurgitation is trivial. Aortic regurgitation PHT measures 521 msec. No aortic stenosis is present. Pulmonic Valve: The pulmonic valve was not well visualized. Pulmonic valve regurgitation is mild. No evidence of pulmonic stenosis. Aorta: The aortic root and ascending aorta are structurally normal, with no evidence of dilitation and the aortic arch was not well visualized. Venous: The inferior vena cava was not well visualized. IAS/Shunts: The atrial septum is grossly normal.  LEFT VENTRICLE PLAX 2D LVIDd:         5.65 cm  Diastology LVIDs:         4.13 cm  LV e' medial:    6.09 cm/s LV PW:         1.40 cm  LV E/e' medial:  11.8 LV IVS:        1.12 cm  LV e' lateral:   7.51 cm/s LVOT diam:     2.10 cm  LV E/e' lateral: 9.6 LV SV:         74 LV SV Index:   31 LVOT Area:     3.46 cm  RIGHT VENTRICLE RV Basal diam:  2.96 cm RV S prime:     7.40 cm/s TAPSE (M-mode): 1.4 cm LEFT ATRIUM              Index       RIGHT ATRIUM           Index LA diam:        5.50 cm  2.31 cm/m  RA Area:     13.80 cm LA Vol (A2C):   114.0 ml 47.90 ml/m RA Volume:   28.40 ml  11.93 ml/m LA Vol (A4C):   87.6 ml  36.81 ml/m LA Biplane Vol: 101.0 ml  42.44 ml/m  AORTIC VALVE LVOT Vmax:   85.00 cm/s LVOT Vmean:  60.800 cm/s LVOT VTI:    0.213 m AI PHT:      521 msec  AORTA Ao Root diam: 3.50 cm Ao Asc diam:  3.70 cm MITRAL VALVE MV Area (PHT): 2.54 cm    SHUNTS MV Decel Time: 299 msec    Systemic VTI:  0.21 m MV E velocity: 71.80 cm/s  Systemic Diam: 2.10 cm MV A velocity: 81.50 cm/s MV E/A ratio:  0.88 Buford Dresser MD Electronically signed by Buford Dresser MD Signature Date/Time: 12/24/2019/12:01:13 PM    Final    VAS [place] LOWER EXTREMITY VENOUS (DVT)  Result Date: 12/15/2019  Lower Venous DVT Study Indications: Elevated ddimer.  Comparison Study: 07/09/18 previous Performing Technologist: Abram Sander RVS  Examination Guidelines: A complete evaluation includes B-mode imaging, spectral Doppler, color Doppler, and power Doppler as needed of all accessible portions of each vessel. Bilateral testing is considered an integral part of a complete examination. Limited examinations for reoccurring indications may be performed as noted. The reflux portion of the exam is performed with the patient in reverse Trendelenburg.  +---------+---------------+---------+-----------+----------+--------------+  RIGHT    CompressibilityPhasicitySpontaneityPropertiesThrombus Aging +---------+---------------+---------+-----------+----------+--------------+ CFV      Full           Yes      Yes                                 +---------+---------------+---------+-----------+----------+--------------+ SFJ      Full                                                        +---------+---------------+---------+-----------+----------+--------------+ FV Prox  Full                                                        +---------+---------------+---------+-----------+----------+--------------+ FV Mid   Full                                                        +---------+---------------+---------+-----------+----------+--------------+ FV  DistalFull                                                        +---------+---------------+---------+-----------+----------+--------------+ PFV      Full                                                        +---------+---------------+---------+-----------+----------+--------------+ POP      Full           Yes      Yes                                 +---------+---------------+---------+-----------+----------+--------------+ PTV      Full                                                        +---------+---------------+---------+-----------+----------+--------------+ PERO     Full                                                        +---------+---------------+---------+-----------+----------+--------------+   +---------+---------------+---------+-----------+----------+--------------+ LEFT     CompressibilityPhasicitySpontaneityPropertiesThrombus Aging +---------+---------------+---------+-----------+----------+--------------+ CFV      Full           Yes      Yes                                 +---------+---------------+---------+-----------+----------+--------------+  SFJ      Full                                                        +---------+---------------+---------+-----------+----------+--------------+ FV Prox  Full                                                        +---------+---------------+---------+-----------+----------+--------------+ FV Mid   Full                                                        +---------+---------------+---------+-----------+----------+--------------+ FV DistalFull                                                        +---------+---------------+---------+-----------+----------+--------------+ PFV      Full                                                        +---------+---------------+---------+-----------+----------+--------------+ POP      Full           Yes      Yes                                  +---------+---------------+---------+-----------+----------+--------------+ PTV      Full                                                        +---------+---------------+---------+-----------+----------+--------------+ PERO     Full                                                        +---------+---------------+---------+-----------+----------+--------------+     Summary: BILATERAL: - No evidence of deep vein thrombosis seen in the lower extremities, bilaterally. - No evidence of superficial venous thrombosis in the lower extremities, bilaterally. -No evidence of popliteal cyst, bilaterally. RIGHT: - Findings appear essentially unchanged compared to previous examination.   *See table(s) above for measurements and observations. Electronically signed by Deitra Mayo MD on 12/15/2019 at 6:22:41 PM.    Final

## 2020-01-02 NOTE — TOC Transition Note (Addendum)
Transition of Care [facility]) - CM/SW Discharge Note   Patient Details  Name: Frank Love MRN: [id number] Date of Birth: [birth year]  [facility] CM/SW Contact:  Verdell Carmine, RN Phone Number: 01/02/2020, 1:22 PM   Clinical Narrative:    3:1 ordered from [facility] [place] Accepting tentatively for PT and RN for charity. Oxygen is no longer needed, spoke to patient.[name] in room. No other needs identifiedWas asking about a blood pressure cuf, . Will follow up with Dr Johnston Ebbs CHW.    Final next level of care: [place] Barriers to Discharge: Continued Medical Work up,Inadequate or no insurance   Patient Goals and CMS Choice        Discharge Placement                       Discharge Plan and Services   Discharge Planning Services: CM Consult,Medication Assistance            DME Arranged: 3-N-1 DME Agency: [employer] Date DME Agency Contacted: 01/02/20 Time DME Agency Contacted: 1300 Representative spoke with at DME Agency: [employer]e [employer]--charity [place]: RN,PT [place] Agency: [place] Date [place]: 01/02/20 Time [place]: [place] Representative spoke with at [place]: [place] (SDOH) Interventions     Readmission Risk Interventions Readmission Risk Prevention Plan 12/21/2018  Transportation Screening (No Data)  Some recent data might be hidden

## 2020-01-02 NOTE — Progress Notes (Signed)
   01/02/20 0428  Assess: MEWS Score  Temp 97.6 F (36.4 C)  BP (!) 95/57  Pulse Rate (!) 50  ECG Heart Rate (!) 50  Resp 17  Level of Consciousness Alert  SpO2 95 %  O2 Device Room Air  Assess: MEWS Score  MEWS Temp 0  MEWS Systolic 1  MEWS Pulse 1  MEWS RR 0  MEWS LOC 0  MEWS Score 2  MEWS Score Color Yellow  Assess: if the MEWS score is Yellow or Red  Were vital signs taken at a resting state? Yes  Focused Assessment No change from prior assessment  Early Detection of Sepsis Score *See Row Information* Low  MEWS guidelines implemented *See Row Information* Yes  Treat  MEWS Interventions Other (Comment) (Increase VS)  Pain Scale 0-10  Pain Score 0  Take Vital Signs  Increase Vital Sign Frequency  Yellow: Q 2hr X 2 then Q 4hr X 2, if remains yellow, continue Q 4hrs  Escalate  MEWS: Escalate Yellow: discuss with charge nurse/RN and consider discussing with provider and RRT  Notify: Charge Nurse/RN  Name of Charge Nurse/RN Notified Vicente Males, Agricultural consultant  Date Charge Nurse/RN Notified 01/02/20  Time Charge Nurse/RN Notified [phone]  Document  Patient Outcome Other (Comment) (Increase in VS)  Progress note created (see row info) Yes

## 2020-01-03 ENCOUNTER — Other Ambulatory Visit (HOSPITAL_COMMUNITY): Payer: Self-pay | Admitting: Internal Medicine

## 2020-01-03 LAB — GLUCOSE, CAPILLARY
Glucose-Capillary: 85 mg/dL (ref 70–99)
Glucose-Capillary: 93 mg/dL (ref 70–99)

## 2020-01-03 MED ORDER — ALBUTEROL SULFATE HFA 108 (90 BASE) MCG/ACT IN AERS: 2.0000 | INHALATION_SPRAY | Freq: Four times a day (QID) | RESPIRATORY_TRACT | 0 refills | Status: DC | PRN

## 2020-01-03 MED ORDER — ATORVASTATIN CALCIUM 40 MG PO TABS
40.0000 mg | ORAL_TABLET | Freq: Every day | ORAL | Status: DC
Start: 2020-01-03 — End: 2020-05-06

## 2020-01-03 MED ORDER — ASPIRIN EC 81 MG PO TBEC
81.0000 mg | DELAYED_RELEASE_TABLET | Freq: Every day | ORAL | 0 refills | Status: DC
Start: 2020-01-03 — End: 2020-06-05

## 2020-01-03 MED ORDER — MIDODRINE HCL 5 MG PO TABS: 15.0000 mg | ORAL_TABLET | Freq: Three times a day (TID) | ORAL | 0 refills | Status: DC

## 2020-01-03 MED FILL — ALBUTEROL SULFATE HFA 108 (: 108 (90 BAS | 25 days supply | Qty: 18 | Fill #0

## 2020-01-03 MED FILL — ASPIRIN LOW DOSE 81 MG TBEC: 81 | 30 days supply | Qty: 30 | Fill #0

## 2020-01-03 MED FILL — MIDODRINE HCL 5 MG TABS: 5 | 30 days supply | Qty: 270 | Fill #0

## 2020-01-03 NOTE — Progress Notes (Signed)
Patient discharging home. Vital signs stable at time of discharge as reflected in discharge summary. Discharge instructions given and verbal understanding returned. Patient to follow up with cardiology and PCP with a week. Patient discharging with 3 filled prescriptions, bedside commode and walker. No questions at this time.

## 2020-01-03 NOTE — Progress Notes (Addendum)
Occupational Therapy Treatment Patient Details Name: Frank Love MRN: [id number] DOB: 01-Apr-1955 Today's Date: 01/03/2020    History of present illness 64 y.o. male Admitted 11/19/2 with medical history significant for chronic systolic CHF, ischemic cardiomyopathy with LVEF 35 to 40%, coronary artery disease status post CABG, penile cancer, carotid artery stenosis, who presented to [facility] via EMS after being found severely hypoxic at home. In the ED, he required nonrebreather and high flow nasal cannula to maintain O2 saturation greater than 92%.  Work-up in the ED revealed severe acute hypoxic respiratory failure secondary to COVID-19 viral pneumonia. Unvaccinated. 12/23/19 discharge cancelled due to onset orthostatic hypotension.   OT comments  Pt seen for OT follow up session in preparation for d/c this date. Pt completed full body dressing at supervision level. He was able to perform sit <>stands during dressing without UE support and fair balance. Pt self initiated x2 fall prevention techniques without VC's. Reviewed safety precautions in home regarding orthostasis and falls. Pt understands when to wear compression garments and abdominal binder. Also reviewed energy conservation techniques and how to re integrate into normal routine. Educated pt on how to use [facility] as shower seat. Anticipate pt to d/c this date, but will continue to follow as long as acutely admitted per POC listed below.   Follow Up Recommendations  Home health OT;Supervision/Assistance - 24 hour    Equipment Recommendations  3 in 1 bedside commode    Recommendations for Other Services      Precautions / Restrictions Precautions Precautions: Other (comment) Precaution Comments: orthostasis Restrictions Weight Bearing Restrictions: No Other Position/Activity Restrictions: has abdominal binder and thigh high compression garments       Mobility Bed Mobility Overal bed mobility: Needs Assistance Bed Mobility:  Supine to Sit;Sit to Supine     Supine to sit: Supervision Sit to supine: Supervision      Transfers Overall transfer level: Needs assistance Equipment used: None Transfers: Sit to/from Stand Sit to Stand: Supervision         General transfer comment: pt performed x3 sit <> stands while dressing for d/c. No RW or need for UE support    Balance Overall balance assessment: Needs assistance Sitting-balance support: Feet supported Sitting balance-Leahy Scale: Good     Standing balance support: Bilateral upper extremity supported;During functional activity Standing balance-Leahy Scale: Fair Standing balance comment: able to lower body dress without UE support                           ADL either performed or assessed with clinical judgement   ADL Overall ADL's : Needs assistance/impaired                                     Functional mobility during ADLs: Supervision/safety;Rolling walker General ADL Comments: Pt able to complete dressing in preparation for d/c at supervision level. Pt donned pajama pants, underwear, and tshirt. He performed sit <> stands without UE support and good balance. No dizziness     Vision Patient Visual Report: No change from baseline     Perception     Praxis      Cognition Arousal/Alertness: Awake/alert Behavior During Therapy: WFL for tasks assessed/performed Overall Cognitive Status: Within Functional Limits for tasks assessed  General Comments: continues to have slow processing, but increased awareness and functional throughout session. Was able to verbalize 2 fall prevention strategies without VC's        Exercises     Shoulder Instructions       General Comments      Pertinent Vitals/ Pain       Pain Assessment: No/denies pain  Home Living                                          Prior Functioning/Environment               Frequency  Min 3X/week        Progress Toward Goals  OT Goals(current goals can now be found in the care plan section)  Progress towards OT goals: Progressing toward goals  Acute Rehab OT Goals Patient Stated Goal: home today OT Goal Formulation: With patient Time For Goal Achievement: 01/13/20 Potential to Achieve Goals: Good  Plan Discharge plan remains appropriate    Co-evaluation                 AM-PAC OT "6 Clicks" Daily Activity     Outcome Measure   Help from another person eating meals?: None Help from another person taking care of personal grooming?: None Help from another person toileting, which includes using toliet, bedpan, or urinal?: A Little Help from another person bathing (including washing, rinsing, drying)?: A Little Help from another person to put on and taking off regular upper body clothing?: None Help from another person to put on and taking off regular lower body clothing?: A Little 6 Click Score: 21    End of Session    OT Visit Diagnosis: Unsteadiness on feet (R26.81);Other abnormalities of gait and mobility (R26.89);Muscle weakness (generalized) (M62.81);Other symptoms and signs involving cognitive function;Dizziness and giddiness (R42)   Activity Tolerance Patient tolerated treatment well   Patient Left in bed;with call bell/phone within reach   Nurse Communication Mobility status        Time: 7711-6579 OT Time Calculation (min): 27 min  Charges: OT General Charges $OT Visit: 1 Visit OT Treatments $Self Care/Home Management : 23-37 mins  Zenovia Jarred, MSOT, OTR/L [facility] Office Number: [phone] Pager: [phone]  Zenovia Jarred 01/03/2020, 12:51 PM

## 2020-01-03 NOTE — TOC Transition Note (Signed)
Transition of Care [facility]) - CM/SW Discharge Note   Patient Details  Name: SEKAI GITLIN MRN: [id number] Date of Birth: [birth year]  [facility] of [facility]) CM/SW Contact:  Verdell Carmine, RN Phone Number: 01/03/2020, 10:44 AM   Clinical Narrative:    Meds sent to [place] for DC, MATCH done for patient.    Final next level of care: [place] Barriers to Discharge: Continued Medical Work up,Inadequate or no insurance   Patient Goals and CMS Choice        Discharge Placement                       Discharge Plan and Services   Discharge Planning Services: CM Consult,Medication Assistance            DME Arranged: 3-N-1 DME Agency: [employer] Date DME Agency Contacted: 01/02/20 Time DME Agency Contacted: 1300 Representative spoke with at DME Agency: Peggye [employer]--charity [place]: RN,PT [place] Agency: [place] Date [place]: 01/02/20 Time [place]: [place] Representative spoke with at [place]: [place] (SDOH) Interventions     Readmission Risk Interventions Readmission Risk Prevention Plan 12/21/2018  Transportation Screening (No Data)  Some recent data might be hidden

## 2020-01-03 NOTE — Discharge Summary (Signed)
PATIENT DETAILS Name: Frank Love Age: 64 y.o. Sex: male Date of Birth: 24-Jan-1956 MRN: [id number]. Admitting Physician: Kayleen Memos, DO BEE:[id number], Vernia Buff, NP  Admit Date: 12/13/2019 Discharge date: 01/03/2020  Recommendations for Outpatient Follow-up:  1. Follow up with PCP in 1-2 weeks 2. Please obtain CMP/CBC in one week 3. Repeat Chest Xray in 4-6 week 4. Please ensure follow-up with cardiology/CHF clinic-all CHF medications on hold due to severe orthostatic hypotension. 5. Please ensure follow-up with hematology-Dr. Feng-for evaluation of thrombocytopenia. 6. Please repeat thyroid function test in the next 1-2 weeks-see below.   Admitted From:  Home  Disposition: Home with home health services   [place]: Yes  Equipment/Devices: None  Discharge Condition: Stable  CODE STATUS: FULL CODE  Diet recommendation:  Diet Order            Diet - low sodium heart healthy           Diet regular Room service appropriate? Yes; Fluid consistency: Thin  Diet effective now                  Brief Summary: Brief Narrative: Patient is a 64 y.o. male with PMHx of CAD s/p CABG, chronic systolic heart failure, carotid artery stenosis, penile cancer-who presented with shortness of breath-patient was found to have severe acute hypoxic respiratory failure due to COVID-19 pneumonia-requiring up to 30 L of HHFNC.  Hospital course with gradual improvement in his hypoxemia-however lately complicated by development of severe orthostatic hypotension and thrombocytopenia.  See below for further details.  COVID-19 vaccinated status: Unvaccinated  Significant Events: 11/19>> Admit to [facility] for severe hypoxia due to COVID-19 pneumonia  Significant studies: 11/19>>Chest x-ray: Multifocal pneumonia 11/21>> bilateral lower extremity Doppler ultrasound: No DVT 11/30>> Echo: EF 45-50% 12/2>> left foot x-ray: No fracture,/bone lesion.  COVID-19  medications: Steroids: 11/19>> 11/27, 11/29>> 12/1 Remdesivir: 11/19>> 11/24 Actemra: 11/20 x 1  Antibiotics: None  Microbiology data: 11/19 >>blood culture: No growth  Procedures: None  Consults: Cardiology  Brief Hospital Course: Acute Hypoxic Resp Failure due to Covid 19 Viral pneumonia: Presented with severe hypoxemia-requiring 30 L of heated high flow-significantly improved after being treated with steroids/Remdesivir and 1 dose of Actemra.  Currently on room air.  Will complete isolation on 12/9.  COVID-19 Labs:  No results for input(s): DDIMER, FERRITIN, LDH, CRP in the last 72 hours.  Lab Results  Component Value Date   SARSCOV2NAA POSITIVE (A) 12/13/2019   [place] NEGATIVE 04/25/2019   [place] NEGATIVE 03/05/2019   [place] NEGATIVE 12/10/2018     Orthostatic hypotension: Thought to be secondary to autonomic dysfunction from COVID infection-on midodrine 15 mg 3 times daily, TED hose-improving-less symptomatic than the past few days.  He stood up yesterday with this MD in the room-with hardly any dizziness-continues to have some amount of mild orthostatic hypotension.  All CHF medications including diuretics on hold.  Plans are to continue midodrine and TED stockings on discharge.  He will follow up with Dr. Eligha Bridegroom the CHF clinic-for further follow-up and optimization of his medications.   Thrombocytopenia: Unclear etiology-could be related to COVID-19 infection/Actemra use-Case discussed with oncology-Dr. Burr Medico on 12/9-chart reviewed-she will arrange for outpatient follow-up/repeat labs in the next 1-2 weeks.    Transaminitis: Mild-slowly downtrending-follow.  Chronic combined systolic and diastolic heart failure: Euvolemic on exam-diuretics/CHF meds on hold due to severe orthostatic hypotension.  Outpatient follow-up with Dr. Hassell Done later this month.  CAD s/p CABG: No anginal symptoms-on aspirin/statin.  ?  Hyperthyroidism (mildly  suppressed TSH, minimally elevated free T4): Currently asymptomatic-needs repeat thyroid function tests in a few weeks by PCP.  This recommendation was discussed with patient-and with daughter over the phone.  Prediabetes (A1c 6.1 on 11/20): Stable for outpatient monitoring by PCP.  Obesity: Estimated body mass index is 30.17 kg/m as calculated from the following:   Height as of this encounter: 6\' 2"  (1.88 m).   Weight as of this encounter: 106.6 kg.    Discharge Diagnoses:  Active Problems:   Chronic systolic CHF (congestive heart failure) (HCC)   Ischemic cardiomyopathy   S/P CABG x 4   Coronary artery disease   COVID-19   Orthostatic hypotension   Discharge Instructions:    Person Under Monitoring Name: Frank Love  Location: [place] [address]   Infection Prevention Recommendations for Individuals Confirmed to have, or Being Evaluated for, 2019 Novel Coronavirus (COVID-19) Infection Who Receive Care at Home  Individuals who are confirmed to have, or are being evaluated for, COVID-19 should follow the prevention steps below until a healthcare provider or local or state health department says they can return to normal activities.  Stay home except to get medical care You should restrict activities outside your home, except for getting medical care. Do not go to work, school, or public areas, and do not use public transportation or taxis.  Call ahead before visiting your doctor Before your medical appointment, call the healthcare provider and tell them that you have, or are being evaluated for, COVID-19 infection. This will help the healthcare provider's office take steps to keep other people from getting infected. Ask your healthcare provider to call the local or state health department.  Monitor your symptoms Seek prompt medical attention if your illness is worsening (e.g., difficulty breathing). Before going to your  medical appointment, call the healthcare provider and tell them that you have, or are being evaluated for, COVID-19 infection. Ask your healthcare provider to call the local or state health department.  Wear a facemask You should wear a facemask that covers your nose and mouth when you are in the same room with other people and when you visit a healthcare provider. People who live with or visit you should also wear a facemask while they are in the same room with you.  Separate yourself from other people in your home As much as possible, you should stay in a different room from other people in your home. Also, you should use a separate bathroom, if available.  Avoid sharing household items You should not share dishes, drinking glasses, cups, eating utensils, towels, bedding, or other items with other people in your home. After using these items, you should wash them thoroughly with soap and water.  Cover your coughs and sneezes Cover your mouth and nose with a tissue when you cough or sneeze, or you can cough or sneeze into your sleeve. Throw used tissues in a lined trash can, and immediately wash your hands with soap and water for at least 20 seconds or use an alcohol-based hand rub.  Wash your [facility] your hands often and thoroughly with soap and water for at least 20 seconds. You can use an alcohol-based hand sanitizer if soap and water are not available and if your hands are not visibly dirty. Avoid touching your eyes, nose, and mouth with unwashed hands.   Prevention Steps for Caregivers and Household Members of Individuals Confirmed to have, or Being Evaluated for, COVID-19  Infection Being Cared for in the Home  If you live with, or provide care at home for, a person confirmed to have, or being evaluated for, COVID-19 infection please follow these guidelines to prevent infection:  Follow healthcare provider's instructions Make sure that you understand and can help the  patient follow any healthcare provider instructions for all care.  Provide for the patient's basic needs You should help the patient with basic needs in the home and provide support for getting groceries, prescriptions, and other personal needs.  Monitor the patient's symptoms If they are getting sicker, call his or her medical provider and tell them that the patient has, or is being evaluated for, COVID-19 infection. This will help the healthcare provider's office take steps to keep other people from getting infected. Ask the healthcare provider to call the local or state health department.  Limit the number of people who have contact with the patient  If possible, have only one caregiver for the patient.  Other household members should stay in another home or place of residence. If this is not possible, they should stay  in another room, or be separated from the patient as much as possible. Use a separate bathroom, if available.  Restrict visitors who do not have an essential need to be in the home.  Keep older adults, very young children, and other sick people away from the patient Keep older adults, very young children, and those who have compromised immune systems or chronic health conditions away from the patient. This includes people with chronic heart, lung, or kidney conditions, diabetes, and cancer.  Ensure good ventilation Make sure that shared spaces in the home have good air flow, such as from an air conditioner or an opened window, weather permitting.  Wash your hands often  Wash your hands often and thoroughly with soap and water for at least 20 seconds. You can use an alcohol based hand sanitizer if soap and water are not available and if your hands are not visibly dirty.  Avoid touching your eyes, nose, and mouth with unwashed hands.  Use disposable paper towels to dry your hands. If not available, use dedicated cloth towels and replace them when they become  wet.  Wear a facemask and gloves  Wear a disposable facemask at all times in the room and gloves when you touch or have contact with the patient's blood, body fluids, and/or secretions or excretions, such as sweat, saliva, sputum, nasal mucus, vomit, urine, or feces.  Ensure the mask fits over your nose and mouth tightly, and do not touch it during use.  Throw out disposable facemasks and gloves after using them. Do not reuse.  Wash your hands immediately after removing your facemask and gloves.  If your personal clothing becomes contaminated, carefully remove clothing and launder. Wash your hands after handling contaminated clothing.  Place all used disposable facemasks, gloves, and other waste in a lined container before disposing them with other household waste.  Remove gloves and wash your hands immediately after handling these items.  Do not share dishes, glasses, or other household items with the patient  Avoid sharing household items. You should not share dishes, drinking glasses, cups, eating utensils, towels, bedding, or other items with a patient who is confirmed to have, or being evaluated for, COVID-19 infection.  After the person uses these items, you should wash them thoroughly with soap and water.  Wash laundry thoroughly  Immediately remove and wash clothes or bedding that have blood, body  fluids, and/or secretions or excretions, such as sweat, saliva, sputum, nasal mucus, vomit, urine, or feces, on them.  Wear gloves when handling laundry from the patient.  Read and follow directions on labels of laundry or clothing items and detergent. In general, wash and dry with the warmest temperatures recommended on the label.  Clean all areas the individual has used often  Clean all touchable surfaces, such as counters, tabletops, doorknobs, bathroom fixtures, toilets, phones, keyboards, tablets, and bedside tables, every day. Also, clean any surfaces that may have blood, body  fluids, and/or secretions or excretions on them.  Wear gloves when cleaning surfaces the patient has come in contact with.  Use a diluted bleach solution (e.g., dilute bleach with 1 part bleach and 10 parts water) or a household disinfectant with a label that says EPA-registered for coronaviruses. To make a bleach solution at home, add 1 tablespoon of bleach to 1 quart (4 cups) of water. For a larger supply, add  cup of bleach to 1 gallon (16 cups) of water.  Read labels of cleaning products and follow recommendations provided on product labels. Labels contain instructions for safe and effective use of the cleaning product including precautions you should take when applying the product, such as wearing gloves or eye protection and making sure you have good ventilation during use of the product.  Remove gloves and wash hands immediately after cleaning.  Monitor yourself for signs and symptoms of illness Caregivers and household members are considered close contacts, should monitor their health, and will be asked to limit movement outside of the home to the extent possible. Follow the monitoring steps for close contacts listed on the symptom monitoring form.   ? If you have additional questions, contact your local health department or call the epidemiologist on call at [phone] (available 24/7). ? This guidance is subject to change. For the most up-to-date guidance from CDC, please refer to their website: YouBlogs.pl    Activity:  As tolerated with Full fall precautions use walker/cane & assistance as needed   Discharge Instructions    (HEART FAILURE PATIENTS) Call MD:  Anytime you have any of the following symptoms: 1) 3 pound weight gain in 24 hours or 5 pounds in 1 week 2) shortness of breath, with or without a dry hacking cough 3) swelling in the hands, feet or stomach 4) if you have to sleep on extra pillows at night in  order to breathe.   Complete by: As directed    Call MD for:  difficulty breathing, headache or visual disturbances   Complete by: As directed    Diet - low sodium heart healthy   Complete by: As directed    Discharge instructions   Complete by: As directed    Follow with Primary MD [name], NP and your cardiologist in 7 days   All your congestive heart failure medications including diuretics have been stopped-please follow with your primary cardiologist for further instructions.  Get CBC, CMP, 2 view Chest X ray -  checked next visit within 1 week by Primary MD   Activity: As tolerated with Full fall precautions use walker/cane & assistance as needed  Disposition Home  Diet: Heart Healthy Low Carb, 1.5 L/day total fluid restriction.   Check your Weight same time everyday, if you gain over 2 pounds, or you develop in leg swelling, experience more shortness of breath or chest pain, call your Primary MD immediately. Follow Cardiac Low Salt Diet and 1.5 lit/day fluid restriction.  Special Instructions: If you have smoked or chewed Tobacco  in the last 2 yrs please stop smoking, stop any regular Alcohol  and or any Recreational drug use.  On your next visit with your primary care physician please Get Medicines reviewed and adjusted.  Please request your Prim.MD to go over all Hospital Tests and Procedure/Radiological results at the follow up, please get all Hospital records sent to your Prim MD by signing hospital release before you go home.  If you experience worsening of your admission symptoms, develop shortness of breath, life threatening emergency, suicidal or homicidal thoughts you must seek medical attention immediately by calling 911 or calling your MD immediately  if symptoms less severe.  You Must read complete instructions/literature along with all the possible adverse reactions/side effects for all the Medicines you take and that have been prescribed to you. Take any  new Medicines after you have completely understood and accpet all the possible adverse reactions/side effects.   Increase activity slowly   Complete by: As directed    Increase activity slowly   Complete by: As directed      Allergies as of 01/03/2020   No Known Allergies     Medication List    STOP taking these medications   carvedilol 6.25 MG tablet Commonly known as: COREG   Digitek 0.125 MG tablet Generic drug: digoxin   Entresto 97-103 MG Generic drug: sacubitril-valsartan   Farxiga 5 MG Tabs tablet Generic drug: dapagliflozin propanediol   furosemide 20 MG tablet Commonly known as: LASIX   potassium chloride SA 20 MEQ tablet Commonly known as: KLOR-CON   spironolactone 25 MG tablet Commonly known as: ALDACTONE     TAKE these medications   albuterol 108 (90 Base) MCG/ACT inhaler Commonly known as: VENTOLIN HFA Inhale 2 puffs into the lungs every 6 (six) hours as needed for wheezing or shortness of breath.   aspirin EC 81 MG tablet Take 1 tablet (81 mg total) by mouth at bedtime.   atorvastatin 40 MG tablet Commonly known as: LIPITOR Take 1 tablet (40 mg total) by mouth daily.   midodrine 5 MG tablet Commonly known as: PROAMATINE Take 3 tablets (15 mg total) by mouth 3 (three) times daily with meals.   nitroGLYCERIN 0.4 MG SL tablet Commonly known as: NITROSTAT Place 1 tablet (0.4 mg total) under the tongue every 5 (five) minutes as needed for chest pain. NO more than 3 pills--if you have the need to take 2 pills or more call MD            Durable Medical Equipment  (From admission, onward)         Start     Ordered   12/22/19 0917  For home use only DME 3 n 1  Once        12/22/19 0917   12/22/19 0917  For home use only DME Walker rolling  Once       Comments: 5 wheel  Question Answer Comment  Walker: With [place] Wheels   Patient needs a walker to treat with the following condition Weakness      12/22/19 0917          Follow-up  Information    [name], NP. Schedule an appointment as soon as possible for a visit in 1 week(s).   Specialty: Nurse Practitioner Why: Follow with your cardiologist within a week. Contact information: [address] [place] [address] [phone]  Larey Dresser, MD Follow up on 01/21/2020.   Specialty: Cardiology Why: 1000. Garage Code 3007 Contact information: [place] [address] [place], [facility] Follow up.   Specialty: [place] Why: your home health agency they will call you to set up within 48 hours of discharge Contact information: [address] [place] [address] [phone]        [facility], [place] Follow up.   Why: for 3:1 and walker Contact information: [place] [address] [phone]        Truitt Merle, MD Follow up.   Specialties: Hematology, Oncology Why: office will call for a follow up appt and lab work. Contact information: [place] [address] [phone]              No Known Allergies     Other Procedures/Studies: US Abdomen Complete  Result Date: 12/30/2019 CLINICAL DATA:  Transaminitis.  Evaluate for splenomegaly. EXAM: ABDOMEN ULTRASOUND COMPLETE COMPARISON:  CT 04/23/2019 FINDINGS: Gallbladder: No gallstones or wall thickening visualized. No sonographic Murphy sign noted by sonographer. Common bile duct: Diameter: Normal caliber, 4 mm Liver: No focal lesion identified. Within normal limits in parenchymal echogenicity. Portal vein is patent on color Doppler imaging with normal direction of blood flow towards the liver. IVC: No abnormality visualized. Pancreas: Visualized portion unremarkable. Spleen: Normal size, 8.9 cm in craniocaudal length. No focal abnormality. Right Kidney: Length: 12.2 cm. Echogenicity within normal limits. No mass or hydronephrosis visualized. Left  Kidney: Length: 12.6 cm. Echogenicity within normal limits. No mass or hydronephrosis visualized. Abdominal aorta: No aneurysm visualized. Other findings: None. IMPRESSION: No evidence of splenomegaly. No acute findings. Electronically Signed   By: Rolm Baptise M.D.   On: 12/30/2019 13:45   DG Chest Portable 1 View  Result Date: 12/13/2019 CLINICAL DATA:  Shortness of breath, suspicion of COVID EXAM: PORTABLE CHEST 1 VIEW COMPARISON:  Radiograph 01/07/2019, CT 11/20/2018 FINDINGS: Heterogeneous consolidative and hazy opacity is present in a lower lung and peripheral predominance most coalescent in the left lung periphery. Some more bandlike opacities, possibly subsegmental atelectatic changes or scarring. No pneumothorax. No visible effusion though the costophrenic sulci are partially collimated. Postsurgical changes related to prior CABG including intact and aligned sternotomy wires and multiple surgical clips projecting over the mediastinum. Telemetry leads overlie the chest. The aorta is calcified. The remaining cardiomediastinal contours are unremarkable. Degenerative changes are present in the imaged spine and shoulders. IMPRESSION: 1. Heterogeneous consolidative and hazy opacity in a lower lung and peripheral predominance most coalescent in the left lung periphery, compatible with a multifocal pneumonia including atypical viral etiology in the appropriate clinical setting. 2. More bandlike opacities likely reflect subsegmental atelectatic changes or scarring. Electronically Signed   By: Lovena Le M.D.   On: 12/13/2019 22:28   DG Foot 2 Views Left  Result Date: 12/28/2019 CLINICAL DATA:  Left foot pain. EXAM: LEFT FOOT - 2 VIEW COMPARISON:  None. FINDINGS: No fracture or bone lesion. Joints are normally spaced and aligned. No significant arthropathic change. Tiny plantar and small to moderate dorsal calcaneal spurs. Nonspecific forefoot soft tissue swelling. IMPRESSION: 1. No fracture, joint  abnormality or bone lesion. 2. Nonspecific soft tissue swelling.  Calcaneal spurs. Electronically Signed   By: Lajean Manes M.D.   On: 12/28/2019 13:49   ECHOCARDIOGRAM COMPLETE  Result Date: 12/24/2019    ECHOCARDIOGRAM REPORT  Patient Name:   Frank Love Date of Exam: 12/24/2019 Medical Rec #:  [id number]          Height:       74.0 in Accession #:    [id number]         Weight:       247.6 lb Date of Birth:  [birth year]          BSA:          2.380 m Patient Age:    52 years           BP:           102/54 mmHg Patient Gender: M                  HR:           61 bpm. Exam Location:  Inpatient Procedure: 2D Echo, Cardiac Doppler and Color Doppler Indications:    H70.26 Chronic systolic (congestive) heart failure  History:        Patient has prior history of Echocardiogram examinations, most                 recent 04/18/2019. COVID-19 Positive.  Sonographer:    Jonelle Sidle Dance Referring Phys: [id number] Carlene Coria  Sonographer Comments: No subcostal window. IMPRESSIONS  1. Left ventricular ejection fraction, by estimation, is 45 to 50%. The left ventricle has mildly decreased function. The left ventricle demonstrates global hypokinesis. The left ventricular internal cavity size was mildly dilated. There is mild left ventricular hypertrophy. Left ventricular diastolic parameters are indeterminate.  2. Right ventricular systolic function is mildly reduced. The right ventricular size is normal.  3. Left atrial size was moderately dilated.  4. The mitral valve is grossly normal. Trivial mitral valve regurgitation. No evidence of mitral stenosis.  5. The aortic valve is grossly normal. Aortic valve regurgitation is trivial. No aortic stenosis is present. Comparison(s): Prior images reviewed side by side. Changes from prior study are noted. Conclusion(s)/Recommendation(s): No hemodynamically significant valvular heart disease. LV function appears improved when directly compared to prior. FINDINGS  Left  Ventricle: Left ventricular ejection fraction, by estimation, is 45 to 50%. The left ventricle has mildly decreased function. The left ventricle demonstrates global hypokinesis. The left ventricular internal cavity size was mildly dilated. There is  mild left ventricular hypertrophy. Left ventricular diastolic parameters are indeterminate. Right Ventricle: The right ventricular size is normal. Right vetricular wall thickness was not well visualized. Right ventricular systolic function is mildly reduced. Left Atrium: Left atrial size was moderately dilated. Right Atrium: Right atrial size was normal in size. Pericardium: There is no evidence of pericardial effusion. Mitral Valve: The mitral valve is grossly normal. Trivial mitral valve regurgitation. No evidence of mitral valve stenosis. Tricuspid Valve: The tricuspid valve is normal in structure. Tricuspid valve regurgitation is trivial. Aortic Valve: The aortic valve is grossly normal. Aortic valve regurgitation is trivial. Aortic regurgitation PHT measures 521 msec. No aortic stenosis is present. Pulmonic Valve: The pulmonic valve was not well visualized. Pulmonic valve regurgitation is mild. No evidence of pulmonic stenosis. Aorta: The aortic root and ascending aorta are structurally normal, with no evidence of dilitation and the aortic arch was not well visualized. Venous: The inferior vena cava was not well visualized. IAS/Shunts: The atrial septum is grossly normal.  LEFT VENTRICLE PLAX 2D LVIDd:         5.65 cm  Diastology LVIDs:         4.13 cm  LV e' medial:    6.09 cm/s LV PW:         1.40 cm  LV E/e' medial:  11.8 LV IVS:        1.12 cm  LV e' lateral:   7.51 cm/s LVOT diam:     2.10 cm  LV E/e' lateral: 9.6 LV SV:         74 LV SV Index:   31 LVOT Area:     3.46 cm  RIGHT VENTRICLE RV Basal diam:  2.96 cm RV S prime:     7.40 cm/s TAPSE (M-mode): 1.4 cm LEFT ATRIUM              Index       RIGHT ATRIUM           Index LA diam:        5.50 cm  2.31 cm/m   RA Area:     13.80 cm LA Vol (A2C):   114.0 ml 47.90 ml/m RA Volume:   28.40 ml  11.93 ml/m LA Vol (A4C):   87.6 ml  36.81 ml/m LA Biplane Vol: 101.0 ml 42.44 ml/m  AORTIC VALVE LVOT Vmax:   85.00 cm/s LVOT Vmean:  60.800 cm/s LVOT VTI:    0.213 m AI PHT:      521 msec  AORTA Ao Root diam: 3.50 cm Ao Asc diam:  3.70 cm MITRAL VALVE MV Area (PHT): 2.54 cm    SHUNTS MV Decel Time: 299 msec    Systemic VTI:  0.21 m MV E velocity: 71.80 cm/s  Systemic Diam: 2.10 cm MV A velocity: 81.50 cm/s MV E/A ratio:  0.88 Buford Dresser MD Electronically signed by Buford Dresser MD Signature Date/Time: 12/24/2019/12:01:13 PM    Final    VAS [place] LOWER EXTREMITY VENOUS (DVT)  Result Date: 12/15/2019  Lower Venous DVT Study Indications: Elevated ddimer.  Comparison Study: 07/09/18 previous Performing Technologist: Abram Sander RVS  Examination Guidelines: A complete evaluation includes B-mode imaging, spectral Doppler, color Doppler, and power Doppler as needed of all accessible portions of each vessel. Bilateral testing is considered an integral part of a complete examination. Limited examinations for reoccurring indications may be performed as noted. The reflux portion of the exam is performed with the patient in reverse Trendelenburg.  +---------+---------------+---------+-----------+----------+--------------+ RIGHT    CompressibilityPhasicitySpontaneityPropertiesThrombus Aging +---------+---------------+---------+-----------+----------+--------------+ CFV      Full           Yes      Yes                                 +---------+---------------+---------+-----------+----------+--------------+ SFJ      Full                                                        +---------+---------------+---------+-----------+----------+--------------+ FV Prox  Full                                                        +---------+---------------+---------+-----------+----------+--------------+  FV Mid   Full                                                        +---------+---------------+---------+-----------+----------+--------------+  FV DistalFull                                                        +---------+---------------+---------+-----------+----------+--------------+ PFV      Full                                                        +---------+---------------+---------+-----------+----------+--------------+ POP      Full           Yes      Yes                                 +---------+---------------+---------+-----------+----------+--------------+ PTV      Full                                                        +---------+---------------+---------+-----------+----------+--------------+ PERO     Full                                                        +---------+---------------+---------+-----------+----------+--------------+   +---------+---------------+---------+-----------+----------+--------------+ LEFT     CompressibilityPhasicitySpontaneityPropertiesThrombus Aging +---------+---------------+---------+-----------+----------+--------------+ CFV      Full           Yes      Yes                                 +---------+---------------+---------+-----------+----------+--------------+ SFJ      Full                                                        +---------+---------------+---------+-----------+----------+--------------+ FV Prox  Full                                                        +---------+---------------+---------+-----------+----------+--------------+ FV Mid   Full                                                        +---------+---------------+---------+-----------+----------+--------------+ FV DistalFull                                                        +---------+---------------+---------+-----------+----------+--------------+   PFV      Full                                                         +---------+---------------+---------+-----------+----------+--------------+ POP      Full           Yes      Yes                                 +---------+---------------+---------+-----------+----------+--------------+ PTV      Full                                                        +---------+---------------+---------+-----------+----------+--------------+ PERO     Full                                                        +---------+---------------+---------+-----------+----------+--------------+     Summary: BILATERAL: - No evidence of deep vein thrombosis seen in the lower extremities, bilaterally. - No evidence of superficial venous thrombosis in the lower extremities, bilaterally. -No evidence of popliteal cyst, bilaterally. RIGHT: - Findings appear essentially unchanged compared to previous examination.   *See table(s) above for measurements and observations. Electronically signed by Deitra Mayo MD on 12/15/2019 at 6:22:41 PM.    Final      TODAY-DAY OF DISCHARGE:  Subjective:   Frank Love today has no headache,no chest abdominal pain,no new weakness tingling or numbness, feels much better wants to go home today.   Objective:   Blood pressure 108/63, pulse (!) 50, temperature (!) 97.3 F (36.3 C), temperature source Oral, resp. rate 14, height 6\' 2"  (1.88 m), weight 106.6 kg, SpO2 96 %.  Intake/Output Summary (Last 24 hours) at 01/03/2020 0953 Last data filed at 01/03/2020 0917 Gross per 24 hour  Intake 840 ml  Output 0 ml  Net 840 ml   Filed Weights   01/01/20 0407 01/02/20 0629 01/03/20 3299  Weight: 105.6 kg 107.6 kg 106.6 kg    Exam: Awake Alert, Oriented *3, No new F.N deficits, Normal affect [place].AT,PERRAL Supple Neck,No JVD, No cervical lymphadenopathy appriciated.  Symmetrical Chest wall movement, Good air movement bilaterally, CTAB RRR,No Gallops,Rubs or new Murmurs, No Parasternal Heave +ve B.Sounds,  Abd Soft, Non tender, No organomegaly appriciated, No rebound -guarding or rigidity. No Cyanosis, Clubbing or edema, No new Rash or bruise   PERTINENT RADIOLOGIC STUDIES: US Abdomen Complete  Result Date: 12/30/2019 CLINICAL DATA:  Transaminitis.  Evaluate for splenomegaly. EXAM: ABDOMEN ULTRASOUND COMPLETE COMPARISON:  CT 04/23/2019 FINDINGS: Gallbladder: No gallstones or wall thickening visualized. No sonographic Murphy sign noted by sonographer. Common bile duct: Diameter: Normal caliber, 4 mm Liver: No focal lesion identified. Within normal limits in parenchymal echogenicity. Portal vein is patent on color Doppler imaging with normal direction of blood flow towards the liver. IVC: No abnormality visualized. Pancreas: Visualized portion unremarkable. Spleen: Normal size, 8.9 cm in craniocaudal length. No focal abnormality. Right  Kidney: Length: 12.2 cm. Echogenicity within normal limits. No mass or hydronephrosis visualized. Left Kidney: Length: 12.6 cm. Echogenicity within normal limits. No mass or hydronephrosis visualized. Abdominal aorta: No aneurysm visualized. Other findings: None. IMPRESSION: No evidence of splenomegaly. No acute findings. Electronically Signed   By: Rolm Baptise M.D.   On: 12/30/2019 13:45   DG Chest Portable 1 View  Result Date: 12/13/2019 CLINICAL DATA:  Shortness of breath, suspicion of COVID EXAM: PORTABLE CHEST 1 VIEW COMPARISON:  Radiograph 01/07/2019, CT 11/20/2018 FINDINGS: Heterogeneous consolidative and hazy opacity is present in a lower lung and peripheral predominance most coalescent in the left lung periphery. Some more bandlike opacities, possibly subsegmental atelectatic changes or scarring. No pneumothorax. No visible effusion though the costophrenic sulci are partially collimated. Postsurgical changes related to prior CABG including intact and aligned sternotomy wires and multiple surgical clips projecting over the mediastinum. Telemetry leads overlie the  chest. The aorta is calcified. The remaining cardiomediastinal contours are unremarkable. Degenerative changes are present in the imaged spine and shoulders. IMPRESSION: 1. Heterogeneous consolidative and hazy opacity in a lower lung and peripheral predominance most coalescent in the left lung periphery, compatible with a multifocal pneumonia including atypical viral etiology in the appropriate clinical setting. 2. More bandlike opacities likely reflect subsegmental atelectatic changes or scarring. Electronically Signed   By: Lovena Le M.D.   On: 12/13/2019 22:28   DG Foot 2 Views Left  Result Date: 12/28/2019 CLINICAL DATA:  Left foot pain. EXAM: LEFT FOOT - 2 VIEW COMPARISON:  None. FINDINGS: No fracture or bone lesion. Joints are normally spaced and aligned. No significant arthropathic change. Tiny plantar and small to moderate dorsal calcaneal spurs. Nonspecific forefoot soft tissue swelling. IMPRESSION: 1. No fracture, joint abnormality or bone lesion. 2. Nonspecific soft tissue swelling.  Calcaneal spurs. Electronically Signed   By: Lajean Manes M.D.   On: 12/28/2019 13:49   ECHOCARDIOGRAM COMPLETE  Result Date: 12/24/2019    ECHOCARDIOGRAM REPORT   Patient Name:   Frank Love Date of Exam: 12/24/2019 Medical Rec #:  [id number]          Height:       74.0 in Accession #:    [id number]         Weight:       247.6 lb Date of Birth:  07/26/1955          BSA:          2.380 m Patient Age:    71 years           BP:           102/54 mmHg Patient Gender: M                  HR:           61 bpm. Exam Location:  Inpatient Procedure: 2D Echo, Cardiac Doppler and Color Doppler Indications:    V49.44 Chronic systolic (congestive) heart failure  History:        Patient has prior history of Echocardiogram examinations, most                 recent 04/18/2019. COVID-19 Positive.  Sonographer:    Jonelle Sidle Dance Referring Phys: [id number] Carlene Coria  Sonographer Comments: No subcostal window. IMPRESSIONS  1.  Left ventricular ejection fraction, by estimation, is 45 to 50%. The left ventricle has mildly decreased function. The left ventricle demonstrates global hypokinesis. The left ventricular internal cavity size was mildly dilated.  There is mild left ventricular hypertrophy. Left ventricular diastolic parameters are indeterminate.  2. Right ventricular systolic function is mildly reduced. The right ventricular size is normal.  3. Left atrial size was moderately dilated.  4. The mitral valve is grossly normal. Trivial mitral valve regurgitation. No evidence of mitral stenosis.  5. The aortic valve is grossly normal. Aortic valve regurgitation is trivial. No aortic stenosis is present. Comparison(s): Prior images reviewed side by side. Changes from prior study are noted. Conclusion(s)/Recommendation(s): No hemodynamically significant valvular heart disease. LV function appears improved when directly compared to prior. FINDINGS  Left Ventricle: Left ventricular ejection fraction, by estimation, is 45 to 50%. The left ventricle has mildly decreased function. The left ventricle demonstrates global hypokinesis. The left ventricular internal cavity size was mildly dilated. There is  mild left ventricular hypertrophy. Left ventricular diastolic parameters are indeterminate. Right Ventricle: The right ventricular size is normal. Right vetricular wall thickness was not well visualized. Right ventricular systolic function is mildly reduced. Left Atrium: Left atrial size was moderately dilated. Right Atrium: Right atrial size was normal in size. Pericardium: There is no evidence of pericardial effusion. Mitral Valve: The mitral valve is grossly normal. Trivial mitral valve regurgitation. No evidence of mitral valve stenosis. Tricuspid Valve: The tricuspid valve is normal in structure. Tricuspid valve regurgitation is trivial. Aortic Valve: The aortic valve is grossly normal. Aortic valve regurgitation is trivial. Aortic  regurgitation PHT measures 521 msec. No aortic stenosis is present. Pulmonic Valve: The pulmonic valve was not well visualized. Pulmonic valve regurgitation is mild. No evidence of pulmonic stenosis. Aorta: The aortic root and ascending aorta are structurally normal, with no evidence of dilitation and the aortic arch was not well visualized. Venous: The inferior vena cava was not well visualized. IAS/Shunts: The atrial septum is grossly normal.  LEFT VENTRICLE PLAX 2D LVIDd:         5.65 cm  Diastology LVIDs:         4.13 cm  LV e' medial:    6.09 cm/s LV PW:         1.40 cm  LV E/e' medial:  11.8 LV IVS:        1.12 cm  LV e' lateral:   7.51 cm/s LVOT diam:     2.10 cm  LV E/e' lateral: 9.6 LV SV:         74 LV SV Index:   31 LVOT Area:     3.46 cm  RIGHT VENTRICLE RV Basal diam:  2.96 cm RV S prime:     7.40 cm/s TAPSE (M-mode): 1.4 cm LEFT ATRIUM              Index       RIGHT ATRIUM           Index LA diam:        5.50 cm  2.31 cm/m  RA Area:     13.80 cm LA Vol (A2C):   114.0 ml 47.90 ml/m RA Volume:   28.40 ml  11.93 ml/m LA Vol (A4C):   87.6 ml  36.81 ml/m LA Biplane Vol: 101.0 ml 42.44 ml/m  AORTIC VALVE LVOT Vmax:   85.00 cm/s LVOT Vmean:  60.800 cm/s LVOT VTI:    0.213 m AI PHT:      521 msec  AORTA Ao Root diam: 3.50 cm Ao Asc diam:  3.70 cm MITRAL VALVE MV Area (PHT): 2.54 cm    SHUNTS MV Decel Time: 299 msec  Systemic VTI:  0.21 m MV E velocity: 71.80 cm/s  Systemic Diam: 2.10 cm MV A velocity: 81.50 cm/s MV E/A ratio:  0.88 Buford Dresser MD Electronically signed by Buford Dresser MD Signature Date/Time: 12/24/2019/12:01:13 PM    Final    VAS [place] LOWER EXTREMITY VENOUS (DVT)  Result Date: 12/15/2019  Lower Venous DVT Study Indications: Elevated ddimer.  Comparison Study: 07/09/18 previous Performing Technologist: Abram Sander RVS  Examination Guidelines: A complete evaluation includes B-mode imaging, spectral Doppler, color Doppler, and power Doppler as needed of all  accessible portions of each vessel. Bilateral testing is considered an integral part of a complete examination. Limited examinations for reoccurring indications may be performed as noted. The reflux portion of the exam is performed with the patient in reverse Trendelenburg.  +---------+---------------+---------+-----------+----------+--------------+ RIGHT    CompressibilityPhasicitySpontaneityPropertiesThrombus Aging +---------+---------------+---------+-----------+----------+--------------+ CFV      Full           Yes      Yes                                 +---------+---------------+---------+-----------+----------+--------------+ SFJ      Full                                                        +---------+---------------+---------+-----------+----------+--------------+ FV Prox  Full                                                        +---------+---------------+---------+-----------+----------+--------------+ FV Mid   Full                                                        +---------+---------------+---------+-----------+----------+--------------+ FV DistalFull                                                        +---------+---------------+---------+-----------+----------+--------------+ PFV      Full                                                        +---------+---------------+---------+-----------+----------+--------------+ POP      Full           Yes      Yes                                 +---------+---------------+---------+-----------+----------+--------------+ PTV      Full                                                        +---------+---------------+---------+-----------+----------+--------------+  PERO     Full                                                        +---------+---------------+---------+-----------+----------+--------------+   +---------+---------------+---------+-----------+----------+--------------+  LEFT     CompressibilityPhasicitySpontaneityPropertiesThrombus Aging +---------+---------------+---------+-----------+----------+--------------+ CFV      Full           Yes      Yes                                 +---------+---------------+---------+-----------+----------+--------------+ SFJ      Full                                                        +---------+---------------+---------+-----------+----------+--------------+ FV Prox  Full                                                        +---------+---------------+---------+-----------+----------+--------------+ FV Mid   Full                                                        +---------+---------------+---------+-----------+----------+--------------+ FV DistalFull                                                        +---------+---------------+---------+-----------+----------+--------------+ PFV      Full                                                        +---------+---------------+---------+-----------+----------+--------------+ POP      Full           Yes      Yes                                 +---------+---------------+---------+-----------+----------+--------------+ PTV      Full                                                        +---------+---------------+---------+-----------+----------+--------------+ PERO     Full                                                        +---------+---------------+---------+-----------+----------+--------------+  Summary: BILATERAL: - No evidence of deep vein thrombosis seen in the lower extremities, bilaterally. - No evidence of superficial venous thrombosis in the lower extremities, bilaterally. -No evidence of popliteal cyst, bilaterally. RIGHT: - Findings appear essentially unchanged compared to previous examination.   *See table(s) above for measurements and observations. Electronically signed by Deitra Mayo MD on  12/15/2019 at 6:22:41 PM.    Final      PERTINENT LAB RESULTS: CBC: Recent Labs    01/01/20 0120  WBC 3.6*  HGB 11.5*  HCT 36.7*  PLT 60*   CMET CMP     Component Value Date/Time   NA 141 01/01/2020 0120   NA 145 (H) 05/02/2019 1016   K 4.2 01/01/2020 0120   CL 108 01/01/2020 0120   CO2 24 01/01/2020 0120   GLUCOSE 98 01/01/2020 0120   BUN 28 (H) 01/01/2020 0120   BUN 27 05/02/2019 1016   CREATININE 1.17 01/01/2020 0120   CALCIUM 8.5 (L) 01/01/2020 0120   PROT 5.4 (L) 01/01/2020 0120   PROT 7.2 08/03/2018 1024   ALBUMIN 2.9 (L) 01/01/2020 0120   ALBUMIN 4.2 08/03/2018 1024   AST 44 (H) 01/01/2020 0120   ALT 91 (H) 01/01/2020 0120   ALKPHOS 37 (L) 01/01/2020 0120   BILITOT 1.4 (H) 01/01/2020 0120   BILITOT 1.3 (H) 08/03/2018 1024   GFRNONAA >60 01/01/2020 0120   GFRAA >60 08/13/2019 1421    GFR Estimated Creatinine Clearance: 83 mL/min (by C-G formula based on SCr of 1.17 mg/dL). No results for input(s): LIPASE, AMYLASE in the last 72 hours. No results for input(s): CKTOTAL, CKMB, CKMBINDEX, TROPONINI in the last 72 hours. Invalid input(s): POCBNP No results for input(s): DDIMER in the last 72 hours. No results for input(s): HGBA1C in the last 72 hours. No results for input(s): CHOL, HDL, LDLCALC, TRIG, CHOLHDL, LDLDIRECT in the last 72 hours. No results for input(s): TSH, T4TOTAL, T3FREE, THYROIDAB in the last 72 hours.  Invalid input(s): FREET3 No results for input(s): VITAMINB12, FOLATE, FERRITIN, TIBC, IRON, RETICCTPCT in the last 72 hours. Coags: No results for input(s): INR in the last 72 hours.  Invalid input(s): PT Microbiology: No results found for this or any previous visit (from the past 240 hour(s)).  FURTHER DISCHARGE INSTRUCTIONS:  Get Medicines reviewed and adjusted: Please take all your medications with you for your next visit with your Primary MD  Laboratory/radiological data: Please request your Primary MD to go over all hospital  tests and procedure/radiological results at the follow up, please ask your Primary MD to get all Hospital records sent to his/her office.  In some cases, they will be blood work, cultures and biopsy results pending at the time of your discharge. Please request that your primary care M.D. goes through all the records of your hospital data and follows up on these results.  Also Note the following: If you experience worsening of your admission symptoms, develop shortness of breath, life threatening emergency, suicidal or homicidal thoughts you must seek medical attention immediately by calling 911 or calling your MD immediately  if symptoms less severe.  You must read complete instructions/literature along with all the possible adverse reactions/side effects for all the Medicines you take and that have been prescribed to you. Take any new Medicines after you have completely understood and accpet all the possible adverse reactions/side effects.   Do not drive when taking Pain medications or sleeping medications (Benzodaizepines)  Do not take more than prescribed Pain, Sleep and Anxiety Medications.  It is not advisable to combine anxiety,sleep and pain medications without talking with your primary care practitioner  Special Instructions: If you have smoked or chewed Tobacco  in the last 2 yrs please stop smoking, stop any regular Alcohol  and or any Recreational drug use.  Wear Seat belts while driving.  Please note: You were cared for by a hospitalist during your hospital stay. Once you are discharged, your primary care physician will handle any further medical issues. Please note that NO REFILLS for any discharge medications will be authorized once you are discharged, as it is imperative that you return to your primary care physician (or establish a relationship with a primary care physician if you do not have one) for your post hospital discharge needs so that they can reassess your need for  medications and monitor your lab values.  Total Time spent coordinating discharge including counseling, education and face to face time equals 35 minutes.  SignedOren Binet 01/03/2020 9:53 AM

## 2020-01-06 ENCOUNTER — Telehealth: Payer: Self-pay | Admitting: Hematology

## 2020-01-06 ENCOUNTER — Telehealth: Payer: Self-pay

## 2020-01-06 NOTE — Telephone Encounter (Signed)
Transition Care Management Follow-up Telephone Call  Date of discharge and from where: 01/03/2020, [facility]   How have you been since you were released from the hospital? He stated he is feeling good, still weak.  Continues to experience some dizziness when he first stands up but it has improved since he was hospitalized. He stands up slowly and waits a few seconds prior to walking.   Any questions or concerns? No, not at this time   Per discharge summary, he completed isolation 01/02/2020.   Items Reviewed:  Did the pt receive and understand the discharge instructions provided? Yes   Medications obtained and verified? medication list reviewed. he has all medications except the atorvastatin.  he said he has not taken that,  explained to him that it is for his cholesterol and he said he will ask the cardiologist about it,   no other quetions about his med regime.   Other? No   Any new allergies since your discharge? No   Dietary orders reviewed  Yes, he said he understands that he is on a 1.5 L /day fluid restriction as well as a heart healthy  diet  Do you have support at home? Yes , his wife  [place] and Equipment/Supplies: Were home health services ordered?  Yes,  He said that he has not heard from them and he is not sure that he really needs therapy. He will discuss with the agency when they call to schedule a visit If so, what is the name of the agency? [facility]  Has the agency set up a time to come to the patient's home? no Were any new equipment or medical supplies ordered?  Yes:  walker adn 3:1 commode.  he said that he has not needed the commode What is the name of the medical supply agency? [employer]  Were you able to get the supplies/equipment? yes Do you have any questions related to the use of the equipment or supplies? No  Functional Questionnaire: (I = Independent and D = Dependent) ADLs: independent  Follow up appointments reviewed:   PCP Hospital  f/u appt confirmed? No , he said he will call to schedule an appointment   Specialist Hospital f/u appt confirmed? Yes cardiology - 01/21/2020.  He is going to call cardiology to confirm if he needs to have the carotid scan 01/14/2020 or if this was done when he was in the hospital   Are transportation arrangements needed? No   If their condition worsens, is the pt aware to call PCP or go to the Emergency Dept.?yes  Was the patient provided with contact information for the PCP's office or ED?  Provided him with the phone number for [facility].  Was to pt encouraged to call back with questions or concerns? yes

## 2020-01-06 NOTE — Telephone Encounter (Signed)
Received a staff msg from Dr. Burr Medico to schedule a new hem appt w/any provider. Pt was seen in the hospital for thrombocytopenia. I cld and lft the pt a vm to schedule an appt

## 2020-01-07 NOTE — Telephone Encounter (Signed)
NOTED

## 2020-01-14 ENCOUNTER — Other Ambulatory Visit: Payer: Self-pay | Admitting: Cardiology

## 2020-01-14 ENCOUNTER — Ambulatory Visit (HOSPITAL_COMMUNITY)
Admission: RE | Admit: 2020-01-14 | Discharge: 2020-01-14 | Disposition: A | Discharge status: Home / Self Care | Payer: Self-pay | Source: Ambulatory Visit | Attending: Cardiovascular Disease | Admitting: Cardiovascular Disease

## 2020-01-14 ENCOUNTER — Other Ambulatory Visit: Payer: Self-pay

## 2020-01-14 ENCOUNTER — Other Ambulatory Visit (HOSPITAL_COMMUNITY): Payer: Self-pay | Admitting: Cardiology

## 2020-01-14 DIAGNOSIS — I350 Nonrheumatic aortic (valve) stenosis: Secondary | ICD-10-CM | POA: Insufficient documentation

## 2020-01-14 DIAGNOSIS — I6523 Occlusion and stenosis of bilateral carotid arteries: Secondary | ICD-10-CM

## 2020-01-14 DIAGNOSIS — U071 COVID-19: Secondary | ICD-10-CM

## 2020-01-16 ENCOUNTER — Other Ambulatory Visit: Payer: Self-pay

## 2020-01-20 NOTE — Progress Notes (Signed)
PCP:  [name], NP  Cardiologist:  [name], MD HF Cardiology: Dr Aundra Dubin    History of Present Illness: Frank Love is a 64 y.o. male with a history of HTN, LE edema, and elevated BNP who returns for followup of CHF and CAD.   He initially was seen by PCP in 5/20 with elevated BNP and was started on lasix with cardiology referral, lasix was subsequently increased to 40 mg bid due to ongoing LE edema. He underwent cardiac evaluation for right hip surgery in September with continued symptoms of LE edema on lasix 40 bid. Echo in 123456 showed systolic HF with EF 99991111. He was started on entresto, metoprolol at this time.   He underwent cath 11/20 which showed multivessel disease, now s/p CABGx4 12/12/18.  Repeat echo done 12/17/18 showed severely decreased right ventricular systolic function with severe enlargement in addition to small pericardial effusion, otherwise similar to previous. VQ scan was negative for PE. Transferred to CIR for rehab.   He had right THR done in [id number] with no complications.   Echo repeated 03/2019, EF 35-40%, mild LVH, mild LV dilation, moderately decreased RV systolic function, normal RV size.  Dr. Aundra Dubin had recommended cardiac MRI to quantify EF more exactly for purposes of ICD determination. This has not been done yet.   OV in 3/21, he was volume overloaded. ReDs clip was 40%. He was started on Lasix 20 mg daily and Entresto was increased to 49/51 mg bid. He has seen PharmD on subsequent visits and has had further increase in Entresto to 97-103 bid. At lasix visit w/ PharmD in May, Farxiga 5 mg was added. ReDs Clip at that visit was 37%.   OV 7/21: No exertional symptoms. NYHA Class II. His wt was up since his last visit w/ Dr. Aundra Dubin in March from 241>>253 lb, but he attributed this to eating more over the last several months.ReDs clip mildly elevated at 38%.Compliant w/ medications, graduated cardiac rehab, has mild chronic LE ankle edema  but stable. Discussed recommendations for cRMI to better quantify EF for ICD determination. Pt reports that he does not plan to complete study as he is adamant that he does not want an ICD.   Admitted to [facility] 11/19-12/10 for COVID viral pneumonia requiring up to 30 L of HHFNC. Received remdesivir x 5 days, steroid taper, tocilizumab/baricitinib. Hospitalization complicated by development of severe orthostatic hypotension and thrombocytopenia. Echo 11/20 with EF 45-50%. Heart Failure team consulted for on-going hypotension and bradycardia. Beta-blocker held, midodrine 15 mg tid started, and abdominal binder and TED hose applied. Patient discharged home off all GDMT medications due to hypotension and bradycardia. Follow up with Heme/Onc for thrombocytopenia work-up. Discharge weight 234.5 lbs.  Today he returns for post hospital follow up. He was discharged off all GDMT due to hypotension and bradycardia. Overall feeling fine. Breathing ok at rest but gets winded walking 200+ feet. Weight is up 21 lbs from discharge. Denies CP, dizziness, palpitations, SOB/PND/Orthopnea.  Baseline sleeps in recliner due to chronic back problems. Has on-going left ankle edema. Wears compression socks, but stopped wearing abdominal binder. Appetite good, has been eating a lot since discharge as his taste has come back. Trying to eat meals at home. No fever or chills. Weight at home ~251 pounds.   ECG today: SR 63 with occasional PVCs, QTC 501 (personally reviewed). REDS clip today: 38%  Labs (2/21): K 4.7, creatinine 1.07, LDL 61 Labs (3/21): K 4.3, creatinine 1.05, digoxin  0.9 Labs (5/21): K 4.3, creatinine 1.25  Labs (12/21): K 4.2, creatinine 1.17  PMH: 1. OA: s/p right THR.  2. HTN 3. Carotid stenosis: Carotid dopplers (11/20) with 40-59% RICA stenosis.  - Carotid dopplers (12/21)  right ICA 40-59% stenosis, left ICA are consistent with a 1-39%  Stenosis. Repeat in 1 year (12/23).  4. CAD: LHC in 11/20 with  totally occluded OM, totally occluded mid LAD, totally occluded mid RCA.  - CABG 11/20 with LIMA-LAD, RIMA-PDA, SVG-OM1, SVG-AM 5. Chronic systolic CHF: Ischemic cardiomyopathy.  - Echo (9/20): EF 30-35%, mildly decreased RV systolic function.  - Echo (11/20): EF 30-35%, severely dilated RV with severely decreased systolic function. - Echo (3/21): EF 35-40%, mild LV dilation with mild LVH, moderately decreased RV systolic function with normal RV size, PASP 33 mmHg.   - Echo (11/21): LVEF improved to 45-50%. RV mildly reduced  6. V/Q scan negative for PE in 11/20.   Past Surgical History:  Procedure Laterality Date  . CIRCUMCISION N/A 10/12/2018   Procedure: CIRCUMCISION ADULT;  Surgeon: Franchot Gallo, MD;  Location: [facility];  Service: Urology;  Laterality: N/A;  45 mins  . CO2 LASER APPLICATION N/A 123456   Procedure: CO2 LASER APPLICATION;  Surgeon: Franchot Gallo, MD;  Location: [facility];  Service: Urology;  Laterality: N/A;  30 MINS  . COLONOSCOPY    . CORONARY ARTERY BYPASS GRAFT N/A 12/12/2018   Procedure: CORONARY ARTERY BYPASS GRAFTING (CABG) x four, using bilateral internal mammary arteries and right leg greater saphenous vein harvested endoscopically;  Surgeon: [name], MD;  Location: [facility];  Service: Open Heart Surgery;  Laterality: N/A;  . LEFT HEART CATH AND CORONARY ANGIOGRAPHY N/A 11/28/2018   Procedure: LEFT HEART CATH AND CORONARY ANGIOGRAPHY;  Surgeon: Burnell Blanks, MD;  Location: [facility];  Service: Cardiovascular;  Laterality: N/A;  . MEDIASTINAL EXPLORATION N/A 12/12/2018   Procedure: Mediastinal Re-Exploration after bypass graft;  Surgeon: [name], MD;  Location: [facility];  Service: Open Heart Surgery;  Laterality: N/A;  . TEE WITHOUT CARDIOVERSION N/A 12/12/2018   Procedure: TRANSESOPHAGEAL ECHOCARDIOGRAM (TEE);  Surgeon: [name], MD;  Location: [facility];  Service: Open Heart Surgery;  Laterality: N/A;  . TOOTH  EXTRACTION    . TOTAL HIP ARTHROPLASTY Right 03/08/2019   Procedure: RIGHT TOTAL HIP ARTHROPLASTY ANTERIOR APPROACH;  Surgeon: Mcarthur Rossetti, MD;  Location: [facility];  Service: Orthopedics;  Laterality: Right;    Current Outpatient Medications  Medication Sig Dispense Refill  . albuterol (VENTOLIN HFA) 108 (90 Base) MCG/ACT inhaler Inhale 2 puffs into the lungs every 6 (six) hours as needed for wheezing or shortness of breath. 6.7 g 0  . aspirin EC 81 MG tablet Take 1 tablet (81 mg total) by mouth at bedtime. 30 tablet 0  . atorvastatin (LIPITOR) 40 MG tablet Take 1 tablet (40 mg total) by mouth daily.    . furosemide (LASIX) 20 MG tablet Take 1 tablet (20 mg total) by mouth as needed for fluid. 15 tablet 3  . nitroGLYCERIN (NITROSTAT) 0.4 MG SL tablet Place 1 tablet (0.4 mg total) under the tongue every 5 (five) minutes as needed for chest pain. NO more than 3 pills--if you have the need to take 2 pills or more call MD 30 tablet 0  . midodrine (PROAMATINE) 10 MG tablet Take 1 tablet (10 mg total) by mouth 3 (three) times daily with meals. 90 tablet 3   No current facility-administered medications  for this encounter.    Allergies:   Patient has no known allergies.   Social History:  The patient  reports that he has never smoked. He has never used smokeless tobacco. He reports that he does not drink alcohol and does not use drugs.   Family History:  The patient's family history includes Diabetes in his mother; Heart disease in his mother.   ROS:  Please see the history of present illness.   All other systems are personally reviewed and negative.   Exam:  BP 131/87   Pulse 68   Wt 116.2 kg (256 lb 3.2 oz)   SpO2 97%   BMI 32.89 kg/m    ECG today: SR 63 with occasional PVCs, QTC 501 (personally reviewed). REDS clip today: 38%  Physical Exam  General:  Well appearing. No resp difficulty HEENT: normal, right ear HOH Neck: supple. no JVD. Carotids 2+ bilat; no bruits. No  lymphadenopathy or thryomegaly appreciated. Cor: PMI nondisplaced. Regular rate & rhythm. No rubs, gallops or murmurs. Lungs: clear upper lobes, faint rhonchi RLL Abdomen: obese, soft, nontender, nondistended. No hepatosplenomegaly. No bruits or masses. Good bowel sounds. Extremities: no cyanosis, clubbing, rash, L>R 1+ pre-tibial edema Neuro: alert & oriented x 3, cranial nerves grossly intact. moves all 4 extremities w/o difficulty. Affect pleasant  Recent Labs: 12/23/2019: TSH 0.059 12/27/2019: Magnesium 1.9 12/30/2019: B Natriuretic Peptide 502.9 01/01/2020: ALT 91; BUN 28; Creatinine, Ser 1.17; Hemoglobin 11.5; Platelets 60; Potassium 4.2; Sodium 141  Personally reviewed   Wt Readings from Last 3 Encounters:  01/21/20 116.2 kg (256 lb 3.2 oz)  01/03/20 106.6 kg (235 lb 0.2 oz)  09/10/19 114.8 kg (253 lb)    ASSESSMENT AND PLAN:  1. COVID-19 Viral Pneumonia - Resolved. On room air. - Completed remdesivir x 5 days,steroid taper, tocilizumab/baricitinib. - Needs follow up repeat CXR per PCP.  2. CAD: S/p CABG in 11/20. Stable w/o CP. He graduated cardiac rehab 6/21  - Continue ASA 81 daily.  - Continue statin. Good lipids on 7/21.   3. Chronic systolic Q000111Q cardiomyopathy.  -Pre-CABG echo with EF 30-35%, mild RV dysfunction. Post-CABG echo with EF 30-35%, severe RV systolic dysfunction. V/Q scan done, no evidence for PE.Hypotensive post-CABG requiring pressors and then midodrine, but improved over time. - Echo repeated 3/21, EF 35-40% with moderate RV dysfunction, GIIDD.Cardiac MRIwas recommendedto quantify EF more exactly for purposes of ICD determination, however pt was adamant that he did not want an ICD and declinedcMRI.  - Echo repeated 11/21 LVEF improved to 45-50%. RV mildly reduced. No longer ICD candidate. - Off GDMT with ongoing hypotension/bradycardia during recent hospitalization - NYHA II-III, confounded by recent COVID pneumonia and subsequent  deconditioning. Volume slightly elevated by ReDs & on exam. - Lasix 20 mg + 20 mEq KCl PRN increased swelling. Instructed patient if he is taking more than 1-2 doses/week of lasix, to call clinic. Likely will not need daily diuretic with improved EF & re-initiation of GDMT. - Decrease midodrine to 10 mg tid. Reduce dose further at next visit. - Will add GDMT at future visits after midodrine wean. - We discussed recommendations for the management of chronic heart failure to control volume/symptoms and reduce risk of acute exacerbation that may necessitate hospitalization.  These measures include continuation of current diuretics with close outpatient monitoring of volume status through daily weights.  Patient advised to check weight daily and to call our office if greater than 3 pound weight gain in 24 hours or greater than 5  pound weight gain in the course of 1 week. Patient also strongly encouraged to adhere to a low salt diet, reducing intake to less than 2 g daily. - BMET today  4. Carotid stenosis:  - Asymptomatic.   -Carotid dopplers (12/21) right ICA 40-59% stenosis, left ICA are consistent with a 1-39%  stenosis. Repeat in 1 year (12/23).  - Continue ASA and statin.   5. Elevated Creatinine - Discharge Cr 1.17. - Labs today.  F/u w/ Dr. Aundra Dubin as scheduled in January.  Frankey Poot, FNP  01/21/2020  11:15 AM   [place] [address] [place] [address] [phone] (office) [phone] (fax)

## 2020-01-21 ENCOUNTER — Ambulatory Visit (HOSPITAL_COMMUNITY)
Admit: 2020-01-21 | Discharge: 2020-01-21 | Disposition: A | Discharge status: Home / Self Care | Payer: Self-pay | Attending: Adult Health | Admitting: Adult Health

## 2020-01-21 ENCOUNTER — Encounter (HOSPITAL_COMMUNITY): Payer: Self-pay

## 2020-01-21 ENCOUNTER — Other Ambulatory Visit (HOSPITAL_COMMUNITY): Payer: Self-pay | Admitting: Adult Health

## 2020-01-21 ENCOUNTER — Other Ambulatory Visit: Payer: Self-pay

## 2020-01-21 VITALS — BP 131/87 | HR 68 | Wt 256.2 lb

## 2020-01-21 DIAGNOSIS — I255 Ischemic cardiomyopathy: Secondary | ICD-10-CM | POA: Insufficient documentation

## 2020-01-21 DIAGNOSIS — I6523 Occlusion and stenosis of bilateral carotid arteries: Secondary | ICD-10-CM

## 2020-01-21 DIAGNOSIS — Z79899 Other long term (current) drug therapy: Secondary | ICD-10-CM | POA: Insufficient documentation

## 2020-01-21 DIAGNOSIS — Z8249 Family history of ischemic heart disease and other diseases of the circulatory system: Secondary | ICD-10-CM | POA: Insufficient documentation

## 2020-01-21 DIAGNOSIS — R7989 Other specified abnormal findings of blood chemistry: Secondary | ICD-10-CM

## 2020-01-21 DIAGNOSIS — Z7982 Long term (current) use of aspirin: Secondary | ICD-10-CM | POA: Insufficient documentation

## 2020-01-21 DIAGNOSIS — Z951 Presence of aortocoronary bypass graft: Secondary | ICD-10-CM

## 2020-01-21 DIAGNOSIS — I2510 Atherosclerotic heart disease of native coronary artery without angina pectoris: Secondary | ICD-10-CM

## 2020-01-21 DIAGNOSIS — U071 COVID-19: Secondary | ICD-10-CM

## 2020-01-21 DIAGNOSIS — Z96641 Presence of right artificial hip joint: Secondary | ICD-10-CM | POA: Insufficient documentation

## 2020-01-21 DIAGNOSIS — J1282 Pneumonia due to coronavirus disease 2019: Secondary | ICD-10-CM

## 2020-01-21 DIAGNOSIS — I5022 Chronic systolic (congestive) heart failure: Secondary | ICD-10-CM

## 2020-01-21 DIAGNOSIS — I110 Hypertensive heart disease with heart failure: Secondary | ICD-10-CM | POA: Insufficient documentation

## 2020-01-21 DIAGNOSIS — Z8616 Personal history of COVID-19: Secondary | ICD-10-CM | POA: Insufficient documentation

## 2020-01-21 LAB — BASIC METABOLIC PANEL
Anion gap: 11 (ref 5–15)
BUN: 15 mg/dL (ref 8–23)
CO2: 22 mmol/L (ref 22–32)
Calcium: 8.6 mg/dL — ABNORMAL LOW (ref 8.9–10.3)
Chloride: 110 mmol/L (ref 98–111)
Creatinine, Ser: 0.91 mg/dL (ref 0.61–1.24)
GFR, Estimated: >60 mL/min (ref >60)
Glucose, Bld: 99 mg/dL (ref 70–99)
Potassium: 3.5 mmol/L (ref 3.5–5.1)
Sodium: 143 mmol/L (ref 135–145)

## 2020-01-21 MED ORDER — FUROSEMIDE 20 MG PO TABS: 20.0000 mg | ORAL_TABLET | ORAL | 3 refills | Status: DC | PRN

## 2020-01-21 MED ORDER — MIDODRINE HCL 10 MG PO TABS: 10.0000 mg | ORAL_TABLET | Freq: Three times a day (TID) | ORAL | 3 refills | Status: DC

## 2020-01-21 NOTE — Progress Notes (Signed)
ReDS Vest / Clip - 01/21/20 1000      ReDS Vest / Clip   Station Marker D    Ruler Value 37    ReDS Value Range Moderate volume overload    ReDS Actual Value 38

## 2020-01-21 NOTE — Patient Instructions (Addendum)
DECREASE Midodrine 10mg  (1 tablet) Three times a day  Take Furosemide 20 mg (1 tablet) AS NEEDED for fluid + 20 mEq of KCl  Labs done today, your results will be available in MyChart, we will contact you for abnormal readings.  Please keep your follow up appointment with Dr.  If you have any questions or concerns before your next appointment please send Shirlee Latch a message through [facility] or call our office at [phone].    TO LEAVE A MESSAGE FOR THE NURSE SELECT OPTION 2, PLEASE LEAVE A MESSAGE INCLUDING: . YOUR NAME . DATE OF BIRTH . CALL BACK NUMBER . REASON FOR CALL**this is important as we prioritize the call backs  YOU WILL RECEIVE A CALL BACK THE SAME DAY AS LONG AS YOU CALL BEFORE 4:00 PM

## 2020-02-05 MED FILL — MIDODRINE HCL 10 MG TABLET: 10 | 30 days supply | Qty: 90 | Fill #0

## 2020-02-18 ENCOUNTER — Encounter (HOSPITAL_COMMUNITY): Payer: Self-pay | Admitting: Cardiology

## 2020-03-02 MED FILL — MIDODRINE HCL 10 MG TABLET: 10 | 30 days supply | Qty: 90 | Fill #1

## 2020-03-12 ENCOUNTER — Encounter (HOSPITAL_COMMUNITY): Payer: Self-pay

## 2020-03-13 MED FILL — POTASSIUM CL ER 20 MEQ TAB: 20 | 30 days supply | Qty: 30 | Fill #3

## 2020-03-13 MED FILL — ?FUROSEMIDE 20MG TABLET: 20 | 30 days supply | Qty: 30 | Fill #5

## 2020-03-16 ENCOUNTER — Other Ambulatory Visit (HOSPITAL_COMMUNITY): Payer: Self-pay | Admitting: Cardiology

## 2020-03-16 ENCOUNTER — Ambulatory Visit (HOSPITAL_COMMUNITY)
Admission: RE | Admit: 2020-03-16 | Discharge: 2020-03-16 | Disposition: A | Discharge status: Home / Self Care | Payer: Self-pay | Source: Ambulatory Visit | Attending: Cardiology | Admitting: Cardiology

## 2020-03-16 ENCOUNTER — Other Ambulatory Visit: Payer: Self-pay

## 2020-03-16 ENCOUNTER — Encounter (HOSPITAL_COMMUNITY): Payer: Self-pay | Admitting: Cardiology

## 2020-03-16 ENCOUNTER — Telehealth (HOSPITAL_COMMUNITY): Payer: Self-pay | Admitting: Pharmacy Technician

## 2020-03-16 VITALS — BP 150/90 | HR 81 | Wt 270.8 lb

## 2020-03-16 DIAGNOSIS — Z7982 Long term (current) use of aspirin: Secondary | ICD-10-CM | POA: Insufficient documentation

## 2020-03-16 DIAGNOSIS — I5022 Chronic systolic (congestive) heart failure: Secondary | ICD-10-CM | POA: Insufficient documentation

## 2020-03-16 DIAGNOSIS — D696 Thrombocytopenia, unspecified: Secondary | ICD-10-CM | POA: Insufficient documentation

## 2020-03-16 DIAGNOSIS — I6523 Occlusion and stenosis of bilateral carotid arteries: Secondary | ICD-10-CM | POA: Insufficient documentation

## 2020-03-16 DIAGNOSIS — Z951 Presence of aortocoronary bypass graft: Secondary | ICD-10-CM | POA: Insufficient documentation

## 2020-03-16 DIAGNOSIS — Z7984 Long term (current) use of oral hypoglycemic drugs: Secondary | ICD-10-CM | POA: Insufficient documentation

## 2020-03-16 DIAGNOSIS — I252 Old myocardial infarction: Secondary | ICD-10-CM | POA: Insufficient documentation

## 2020-03-16 DIAGNOSIS — Z8249 Family history of ischemic heart disease and other diseases of the circulatory system: Secondary | ICD-10-CM | POA: Insufficient documentation

## 2020-03-16 DIAGNOSIS — I110 Hypertensive heart disease with heart failure: Secondary | ICD-10-CM | POA: Insufficient documentation

## 2020-03-16 DIAGNOSIS — I2510 Atherosclerotic heart disease of native coronary artery without angina pectoris: Secondary | ICD-10-CM | POA: Insufficient documentation

## 2020-03-16 DIAGNOSIS — Z79899 Other long term (current) drug therapy: Secondary | ICD-10-CM | POA: Insufficient documentation

## 2020-03-16 DIAGNOSIS — Z8616 Personal history of COVID-19: Secondary | ICD-10-CM | POA: Insufficient documentation

## 2020-03-16 LAB — CBC
HCT: 39.6 % (ref 39.0–52.0)
Hemoglobin: 12.4 g/dL — ABNORMAL LOW (ref 13.0–17.0)
MCH: 27.3 pg (ref 26.0–34.0)
MCHC: 31.3 g/dL (ref 30.0–36.0)
MCV: 87 fL (ref 80.0–100.0)
Platelets: 231 10*3/uL (ref 150–400)
RBC: 4.55 MIL/uL (ref 4.22–5.81)
RDW: 14.2 % (ref 11.5–15.5)
WBC: 6.1 10*3/uL (ref 4.0–10.5)
nRBC: 0 % (ref 0.0–0.2)

## 2020-03-16 LAB — LIPID PANEL
Cholesterol: 173 mg/dL (ref 0–200)
HDL: 40 mg/dL — ABNORMAL LOW (ref >40)
LDL Cholesterol: 113 mg/dL — ABNORMAL HIGH (ref 0–99)
Total CHOL/HDL Ratio: 4.3 RATIO
Triglycerides: 99 mg/dL (ref <150)
VLDL: 20 mg/dL (ref 0–40)

## 2020-03-16 LAB — BASIC METABOLIC PANEL
Anion gap: 9 (ref 5–15)
BUN: 21 mg/dL (ref 8–23)
CO2: 28 mmol/L (ref 22–32)
Calcium: 9.1 mg/dL (ref 8.9–10.3)
Chloride: 105 mmol/L (ref 98–111)
Creatinine, Ser: 1.2 mg/dL (ref 0.61–1.24)
GFR, Estimated: >60 mL/min (ref >60)
Glucose, Bld: 123 mg/dL — ABNORMAL HIGH (ref 70–99)
Potassium: 3.9 mmol/L (ref 3.5–5.1)
Sodium: 142 mmol/L (ref 135–145)

## 2020-03-16 LAB — BRAIN NATRIURETIC PEPTIDE: B Natriuretic Peptide: 1347.3 pg/mL — ABNORMAL HIGH (ref 0.0–100.0)

## 2020-03-16 MED ORDER — SPIRONOLACTONE 25 MG PO TABS: 12.5000 mg | ORAL_TABLET | Freq: Every day | ORAL | 3 refills | Status: DC

## 2020-03-16 MED ORDER — FUROSEMIDE 20 MG PO TABS: 40.0000 mg | ORAL_TABLET | Freq: Every day | ORAL | 3 refills | Status: DC

## 2020-03-16 MED ORDER — POTASSIUM CHLORIDE CRYS ER 20 MEQ PO TBCR
40.0000 meq | EXTENDED_RELEASE_TABLET | Freq: Once | ORAL | Status: AC
  Administered 2020-03-16: 40 meq via ORAL

## 2020-03-16 MED ORDER — DAPAGLIFLOZIN PROPANEDIOL 10 MG PO TABS: 10.0000 mg | ORAL_TABLET | Freq: Every day | ORAL | 11 refills | Status: DC

## 2020-03-16 MED ORDER — FUROSEMIDE 10 MG/ML IJ SOLN
80.0000 mg | Freq: Once | INTRAMUSCULAR | Status: AC
  Administered 2020-03-16: 80 mg via INTRAVENOUS

## 2020-03-16 MED ORDER — POTASSIUM CHLORIDE CRYS ER 20 MEQ PO TBCR: 20.0000 meq | EXTENDED_RELEASE_TABLET | Freq: Every day | ORAL | 3 refills | Status: DC

## 2020-03-16 MED FILL — !FARXIGA 10MG TABLET: 10 | 30 days supply | Qty: 30 | Fill #0

## 2020-03-16 MED FILL — ?SPIRONOLACTONE 25 MG TABLE: 25 | 30 days supply | Qty: 15 | Fill #0

## 2020-03-16 NOTE — Telephone Encounter (Signed)
Patient was seen in clinic today and started on Farxiga. The patient is currently uninsured. Started an application for [place] assistance.  Will fax in once signatures are obtained.

## 2020-03-16 NOTE — Progress Notes (Addendum)
PCP:  [name], NP  Cardiologist:  [name], MD HF Cardiology: Dr Aundra Dubin    History of Present Illness: Frank Love is a 65 y.o. male with a history of HTN, LE edema, and elevated BNP who returns for followup of CHF and CAD.   He initially was seen by PCP in 5/20 with elevated BNP and was started on lasix with cardiology referral, lasix was subsequently increased to 40 mg bid due to ongoing LE edema. He underwent cardiac evaluation for right hip surgery in September with continued symptoms of LE edema on lasix 40 bid. Echo in 4/49 showed systolic HF with EF 67-59%. He was started on entresto, metoprolol at this time.   He underwent cath 11/20 which showed multivessel disease, had CABGx4 12/12/18.  Repeat echo done post-CABG 12/17/18 showed severely decreased right ventricular systolic function with severe enlargement in addition to small pericardial effusion, otherwise similar to previous. VQ scan was negative for PE. Transferred to CIR for rehab.   He had right THR done in 1/63 with no complications.   Echo repeated 03/2019, EF 35-40%, mild LVH, mild LV dilation, moderately decreased RV systolic function, normal RV size.   Admitted to [facility] 11/19-12/10/21 for COVID-19 viral pneumonia requiring up to 30 L of HFNC. Received remdesivir x 5 days, steroid taper, tocilizumab/baricitinib. Hospitalization complicated by development of severe orthostatic hypotension and thrombocytopenia. Echo 12/14/19 with EF 45-50%. Heart Failure team consulted for on-going hypotension and bradycardia. Beta-blocker held, midodrine 15 mg tid started, and abdominal binder and TED hose applied. Patient discharged home off all GDMT medications due to hypotension and bradycardia.   He returns for followup of CHF.  He has had a cough for 3 wks.  In the last week, he has been short of breath walking 200 feet or walking up stairs.  No chest pain. He chronically sleeps propped up in bed, this has not  changed. He no longer gets lightheadedness with standing.  He is still taking midodrine 10 mg tid. Weight is up 14 lbs. Legs are swelling. He has not been taking Lasix regularly. However, he took Lasix 40 mg daily for the last 3 days as instructed by the clinic.  He thinks that this has helped some, decreased edema.   ECG (personally reviewed): NSR, LAFB, old anterior MI  Labs (2/21): K 4.7, creatinine 1.07, LDL 61 Labs (3/21): K 4.3, creatinine 1.05, digoxin 0.9 Labs (5/21): K 4.3, creatinine 1.25  Labs (12/21): K 4.2 => 3.5, creatinine 1.17 => 0.91, plts 60K  PMH: 1. OA: s/p right THR.  2. HTN 3. Carotid stenosis: Carotid dopplers (11/20) with 40-59% RICA stenosis.  - Carotid dopplers (12/21)  right ICA 40-59% stenosis, left ICA mild stenosis.  4. CAD: LHC in 11/20 with totally occluded OM, totally occluded mid LAD, totally occluded mid RCA.  - CABG 11/20 with LIMA-LAD, RIMA-PDA, SVG-OM1, SVG-AM 5. Chronic systolic CHF: Ischemic cardiomyopathy.  - Echo (9/20): EF 30-35%, mildly decreased RV systolic function.  - Echo (11/20): EF 30-35%, severely dilated RV with severely decreased systolic function. - Echo (3/21): EF 35-40%, mild LV dilation with mild LVH, moderately decreased RV systolic function with normal RV size, PASP 33 mmHg.   - Echo (11/21): LVEF improved to 45-50%. RV mildly reduced  6. V/Q scan negative for PE in 11/20.  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  8. COVID-19 11/21  Past Surgical History:  Procedure Laterality Date  . CIRCUMCISION N/A 10/12/2018   Procedure: CIRCUMCISION ADULT;  Surgeon: Franchot Gallo, MD;  Location: [facility];  Service: Urology;  Laterality: N/A;  45 mins  . CO2 LASER APPLICATION N/A 01/29/1094   Procedure: CO2 LASER APPLICATION;  Surgeon: Franchot Gallo, MD;  Location: [facility];  Service: Urology;  Laterality: N/A;  30 MINS  . COLONOSCOPY    . CORONARY ARTERY BYPASS GRAFT N/A 12/12/2018   Procedure: CORONARY ARTERY BYPASS GRAFTING (CABG) x  four, using bilateral internal mammary arteries and right leg greater saphenous vein harvested endoscopically;  Surgeon: [name], MD;  Location: [facility];  Service: Open Heart Surgery;  Laterality: N/A;  . LEFT HEART CATH AND CORONARY ANGIOGRAPHY N/A 11/28/2018   Procedure: LEFT HEART CATH AND CORONARY ANGIOGRAPHY;  Surgeon: Burnell Blanks, MD;  Location: [facility];  Service: Cardiovascular;  Laterality: N/A;  . MEDIASTINAL EXPLORATION N/A 12/12/2018   Procedure: Mediastinal Re-Exploration after bypass graft;  Surgeon: [name], MD;  Location: [facility];  Service: Open Heart Surgery;  Laterality: N/A;  . TEE WITHOUT CARDIOVERSION N/A 12/12/2018   Procedure: TRANSESOPHAGEAL ECHOCARDIOGRAM (TEE);  Surgeon: [name], MD;  Location: [facility];  Service: Open Heart Surgery;  Laterality: N/A;  . TOOTH EXTRACTION    . TOTAL HIP ARTHROPLASTY Right 03/08/2019   Procedure: RIGHT TOTAL HIP ARTHROPLASTY ANTERIOR APPROACH;  Surgeon: Mcarthur Rossetti, MD;  Location: [facility];  Service: Orthopedics;  Laterality: Right;    Current Outpatient Medications  Medication Sig Dispense Refill  . albuterol (VENTOLIN HFA) 108 (90 Base) MCG/ACT inhaler Inhale 2 puffs into the lungs every 6 (six) hours as needed for wheezing or shortness of breath. 6.7 g 0  . aspirin EC 81 MG tablet Take 1 tablet (81 mg total) by mouth at bedtime. 30 tablet 0  . atorvastatin (LIPITOR) 40 MG tablet Take 1 tablet (40 mg total) by mouth daily.    . dapagliflozin propanediol (FARXIGA) 10 MG TABS tablet Take 1 tablet (10 mg total) by mouth daily before breakfast. 30 tablet 11  . nitroGLYCERIN (NITROSTAT) 0.4 MG SL tablet Place 1 tablet (0.4 mg total) under the tongue every 5 (five) minutes as needed for chest pain. NO more than 3 pills--if you have the need to take 2 pills or more call MD 30 tablet 0  . potassium chloride SA (KLOR-CON) 20 MEQ tablet Take 1 tablet (20 mEq total) by mouth daily. 90 tablet 3  .  spironolactone (ALDACTONE) 25 MG tablet Take 0.5 tablets (12.5 mg total) by mouth daily. 45 tablet 3  . furosemide (LASIX) 20 MG tablet Take 2 tablets (40 mg total) by mouth daily. 180 tablet 3   No current facility-administered medications for this encounter.    Allergies:   Patient has no known allergies.   Social History:  The patient  reports that he has never smoked. He has never used smokeless tobacco. He reports that he does not drink alcohol and does not use drugs.   Family History:  The patient's family history includes Diabetes in his mother; Heart disease in his mother.   ROS:  Please see the history of present illness.   All other systems are personally reviewed and negative.   Exam:  BP (!) 150/90   Pulse 81   Wt 122.8 kg (270 lb 12.8 oz)   SpO2 97%   BMI 34.77 kg/m    Physical Exam  General: NAD Neck: JVP 14 cm with HJR, no thyromegaly or thyroid nodule.  Lungs: Clear to auscultation bilaterally with normal respiratory  effort. CV: Nondisplaced PMI.  Heart regular S1/S2, no S3/S4, 2/6 HSM LLSB.  2+ edema to knees bilaterally.  No carotid bruit.  Normal pedal pulses.  Abdomen: Soft, nontender, no hepatosplenomegaly, no distention.  Skin: Intact without lesions or rashes.  Neurologic: Alert and oriented x 3.  Psych: Normal affect. Extremities: No clubbing or cyanosis.  HEENT: Normal.    Recent Labs: 12/23/2019: TSH 0.059 12/27/2019: Magnesium 1.9 01/01/2020: ALT 91 03/16/2020: B Natriuretic Peptide 1,347.3; BUN 21; Creatinine, Ser 1.20; Hemoglobin 12.4; Platelets 231; Potassium 3.9; Sodium 142  Personally reviewed   Wt Readings from Last 3 Encounters:  03/16/20 122.8 kg (270 lb 12.8 oz)  01/21/20 116.2 kg (256 lb 3.2 oz)  01/03/20 106.6 kg (235 lb 0.2 oz)    ASSESSMENT AND PLAN:  1. CAD: S/p CABG in 11/20. No chest pain.  - Continue ASA 81 daily.  - Continue atorvastatin, check lipids today.  2. Chronic systolic CHF: Ischemic cardiomyopathy. Pre-CABG echo  with EF 30-35%, mild RV dysfunction. Post-CABG echo (11/20) with EF 30-35%, severe RV systolic dysfunction. V/Q scan done, no evidence for PE.Hypotensive post-CABG requiring pressors and then midodrine, but improved over time. Echo repeated 3/21, EF 35-40% with moderate RV dysfunction, GIIDD.Cardiac MRIwas recommendedto quantify EF more exactly for purposes of ICD determination, however pt was adamant that he did not want an ICD and declinedcMRI. Echo repeated 11/21 LVEF improved to 45-50%, RV mildly reduced. No longer ICD candidate.  After COVID-19 infection in 11/21, patient developed severe orthostatic hypotension and was taken off all his cardiac meds.  BP is actually high today, he is still on midodrine 10 mg tid.  No orthostatic symptoms. On exam, he is markedly volume overloaded with weight gain and NYHA class III symptoms.  - Stop midodrine.  - I will give him Lasix 80 mg IV x 1 with KCl 40 in clinic today.  He will start Lasix 40 mg po bid x 5 days, then decrease to 40 mg daily after that.  Take KCl 20 daily.  BMET today and again in 1 week.  - Restart Farxiga 10 mg daily.  - Restart spironolactone 12.5 mg daily. - With worsening volume overload, I will arrange for repeat echo.  3. Carotid stenosis: Carotid dopplers (12/21) right ICA 40-59% stenosis, left ICA are consistent with a 1-39%  stenosis. Repeat in 1 year (12/22).  - Continue ASA and statin.  4. Chronic thrombocytopenia: Refer to hematology for evaluation.   Followup in 1 week.  Signed, [name], MD  03/16/2020    [facility] [address] [facility] [place] [address] [phone] (office) [phone] (fax)

## 2020-03-16 NOTE — Progress Notes (Signed)
After receiving 80mg  of IV Lasix patient voided 432mL of urine.

## 2020-03-16 NOTE — Patient Instructions (Addendum)
Labs done today. We will contact you only if your labs are abnormal.  STOP taking midodrine  START Farxiga 10mg  (1 tablet) by mouth daily.  START Potassium 64meq (1 tablet) by mouth daily.  INCREASE Lasix to 40mg  (2 tablets) by mouth 2 times daily for 5 days THEN DECREASE to 40mg  (2 tablets) by mouth daily.   No other medication changes were made. Please continue all current medications as prescribed.  Your physician recommends that you schedule a follow-up appointment in: 1-2 weeks with an echo prior to your exam.  Your physician has requested that you have an echocardiogram. Echocardiography is a painless test that uses sound waves to create images of your heart. It provides your doctor with information about the size and shape of your heart and how well your heart's chambers and valves are working. This procedure takes approximately one hour. There are no restrictions for this procedure.  Please wear your compression hose daily, place them on as soon as you get up in the morning and remove before you go to bed at night.  You have been referred to [facility] for low blood platelets. They will contact you to schedule an appointment.   If you have any questions or concerns before your next appointment please send [place] a message through [place] or call our office at [phone].    TO LEAVE A MESSAGE FOR THE NURSE SELECT OPTION 2, PLEASE LEAVE A MESSAGE INCLUDING: . YOUR NAME . DATE OF BIRTH . CALL BACK NUMBER . REASON FOR CALL**this is important as we prioritize the call backs  YOU WILL RECEIVE A CALL BACK THE SAME DAY AS LONG AS YOU CALL BEFORE 4:00 PM   Do the following things EVERYDAY: 1) Weigh yourself in the morning before breakfast. Write it down and keep it in a log. 2) Take your medicines as prescribed 3) Eat low salt foods--Limit salt (sodium) to 2000 mg per day.  4) Stay as active as you can everyday 5) Limit all fluids for the day to less than 2  liters   At the [facility], you and your health needs are our priority. As part of our continuing mission to provide you with exceptional heart care, we have created designated Provider Care Teams. These Care Teams include your primary Cardiologist (physician) and Advanced Practice Providers (APPs- Physician Assistants and Nurse Practitioners) who all work together to provide you with the care you need, when you need it.   You may see any of the following providers on your designated Care Team at your next follow up: Marland Kitchen Dr Glori Bickers . Dr [name] . Darrick Grinder, NP . Lyda Jester, PA . Audry Riles, PharmD   Please be sure to bring in all your medications bottles to every appointment.

## 2020-03-18 ENCOUNTER — Ambulatory Visit (INDEPENDENT_AMBULATORY_CARE_PROVIDER_SITE_OTHER): Payer: Self-pay | Admitting: Orthopaedic Surgery

## 2020-03-18 ENCOUNTER — Ambulatory Visit (INDEPENDENT_AMBULATORY_CARE_PROVIDER_SITE_OTHER): Payer: Self-pay

## 2020-03-18 ENCOUNTER — Encounter: Payer: Self-pay | Admitting: Orthopaedic Surgery

## 2020-03-18 DIAGNOSIS — Z96641 Presence of right artificial hip joint: Secondary | ICD-10-CM

## 2020-03-18 NOTE — Telephone Encounter (Signed)
Sent in application via fax.  Will follow up.  

## 2020-03-18 NOTE — Progress Notes (Signed)
HPI: Mr. Fonder returns just 1 year status post right total hip arthroplasty.  He is doing well.  He has no complaints.  Denies any injury to the hip.  Denies any pain lateral aspect of the hip.  Slight numbness at the top of the incision but otherwise no numbness tingling down the right leg.  Review of systems: See HPI otherwise negative or noncontributory.  Physical exam: General well-developed well-nourished male no acute distress.  Ambulates without any assistive device.  Right hip: Excellent range of motion without pain.  Dorsiflexion plantarflexion intact right ankle.  Radiographs: AP pelvis lateral view right hip: Shows well seated right total hip components.  Both hips are located.  No acute fractures or acute findings.  Plan: Questions were encouraged and answered by Dr. Ninfa Linden and myself.  Patient will follow up on as-needed basis.

## 2020-03-19 ENCOUNTER — Telehealth: Payer: Self-pay | Admitting: *Deleted

## 2020-03-19 NOTE — Telephone Encounter (Signed)
TCT patient regarding his new patient appt tomorrow with Dr. Lorenso Courier for thrombocytopenia. Spoke with patient and advised that his labs from 03/16/20 show full recovery of his platelets. They are now 231. HGB 12.4. Advised that Dr. Lorenso Courier has reviewed his referral information and he does not believe that patient needs to come in tomorrow. Advised that if patient has a recurrence of low platelets, we can certainly se him at that time. Pt very pleased with new platelet count and is fine about cancelling his appt tomorrow.. Dr. Tawni Pummel made aware.

## 2020-03-20 ENCOUNTER — Inpatient Hospital Stay: Payer: MEDICAID | Admitting: Hematology and Oncology

## 2020-03-20 ENCOUNTER — Inpatient Hospital Stay: Payer: MEDICAID

## 2020-03-24 ENCOUNTER — Ambulatory Visit (HOSPITAL_COMMUNITY)
Admission: RE | Admit: 2020-03-24 | Discharge: 2020-03-24 | Disposition: A | Discharge status: Home / Self Care | Payer: Self-pay | Source: Ambulatory Visit | Attending: Cardiology | Admitting: Cardiology

## 2020-03-24 ENCOUNTER — Other Ambulatory Visit (HOSPITAL_COMMUNITY): Payer: Self-pay | Admitting: Cardiology

## 2020-03-24 ENCOUNTER — Other Ambulatory Visit: Payer: Self-pay

## 2020-03-24 ENCOUNTER — Telehealth (HOSPITAL_COMMUNITY): Payer: Self-pay

## 2020-03-24 DIAGNOSIS — I5022 Chronic systolic (congestive) heart failure: Secondary | ICD-10-CM | POA: Insufficient documentation

## 2020-03-24 LAB — BASIC METABOLIC PANEL
Anion gap: 10 (ref 5–15)
BUN: 29 mg/dL — ABNORMAL HIGH (ref 8–23)
CO2: 27 mmol/L (ref 22–32)
Calcium: 9.6 mg/dL (ref 8.9–10.3)
Chloride: 105 mmol/L (ref 98–111)
Creatinine, Ser: 1.12 mg/dL (ref 0.61–1.24)
GFR, Estimated: >60 mL/min (ref >60)
Glucose, Bld: 99 mg/dL (ref 70–99)
Potassium: 3.9 mmol/L (ref 3.5–5.1)
Sodium: 142 mmol/L (ref 135–145)

## 2020-03-24 NOTE — Telephone Encounter (Signed)
-----   Message from Larey Dresser, MD sent at 03/16/2020  3:21 PM EST ----- High LDL, increase atorvastatin to 80 mg daily. Lipids/LFTs in 2 months.

## 2020-03-24 NOTE — Telephone Encounter (Signed)
Pt states he is not taking lipitor 40mg  daily.  He says there were a lot of medication changes made by his doctors and he was unaware he should take lipitor. He will start taking lipitor daily.  Pt has f/u appt next week. Advised to start taking. Verbalized understanding.

## 2020-03-25 MED FILL — ?ATORVASTATIN 40MG TABLET: 40 | 30 days supply | Qty: 30 | Fill #4

## 2020-03-26 ENCOUNTER — Other Ambulatory Visit (HOSPITAL_COMMUNITY): Payer: Self-pay | Admitting: Cardiology

## 2020-03-26 MED FILL — ?FUROSEMIDE 20MG TABLET: 20 | 30 days supply | Qty: 30 | Fill #6

## 2020-03-27 ENCOUNTER — Other Ambulatory Visit (HOSPITAL_COMMUNITY): Payer: Self-pay | Admitting: Cardiology

## 2020-03-30 ENCOUNTER — Other Ambulatory Visit (HOSPITAL_COMMUNITY): Payer: Self-pay | Admitting: *Deleted

## 2020-03-30 MED ORDER — DAPAGLIFLOZIN PROPANEDIOL 10 MG PO TABS: 10.0000 mg | ORAL_TABLET | Freq: Every day | ORAL | 11 refills | Status: DC

## 2020-03-30 NOTE — Telephone Encounter (Signed)
Called [employer] to check the status of the patient's application. Representative stated that the patient was previously approved until 05/13/20. They will not accept his re-enrollment application until 5/73/22. Representative stated that they requested a refill from [place] as well. Sent Jasmine, ([place]) a request for that.

## 2020-04-02 ENCOUNTER — Other Ambulatory Visit (HOSPITAL_COMMUNITY): Payer: Self-pay | Admitting: Cardiology

## 2020-04-02 ENCOUNTER — Other Ambulatory Visit (HOSPITAL_COMMUNITY): Payer: Self-pay | Admitting: *Deleted

## 2020-04-02 MED ORDER — DAPAGLIFLOZIN PROPANEDIOL 10 MG PO TABS: 10.0000 mg | ORAL_TABLET | Freq: Every day | ORAL | 11 refills | Status: DC

## 2020-04-03 ENCOUNTER — Encounter (HOSPITAL_COMMUNITY): Payer: Self-pay | Admitting: Cardiology

## 2020-04-03 ENCOUNTER — Ambulatory Visit (HOSPITAL_COMMUNITY)
Admission: RE | Admit: 2020-04-03 | Discharge: 2020-04-03 | Disposition: A | Discharge status: Home / Self Care | Payer: Self-pay | Source: Ambulatory Visit | Attending: Cardiology | Admitting: Cardiology

## 2020-04-03 ENCOUNTER — Other Ambulatory Visit: Payer: Self-pay

## 2020-04-03 ENCOUNTER — Other Ambulatory Visit (HOSPITAL_COMMUNITY): Payer: Self-pay | Admitting: Cardiology

## 2020-04-03 ENCOUNTER — Ambulatory Visit (HOSPITAL_BASED_OUTPATIENT_CLINIC_OR_DEPARTMENT_OTHER)
Admission: RE | Admit: 2020-04-03 | Discharge: 2020-04-03 | Disposition: A | Discharge status: Home / Self Care | Payer: Self-pay | Source: Ambulatory Visit | Attending: Cardiology | Admitting: Cardiology

## 2020-04-03 VITALS — BP 138/70 | HR 65 | Wt 262.8 lb

## 2020-04-03 DIAGNOSIS — Z79899 Other long term (current) drug therapy: Secondary | ICD-10-CM | POA: Insufficient documentation

## 2020-04-03 DIAGNOSIS — I6523 Occlusion and stenosis of bilateral carotid arteries: Secondary | ICD-10-CM | POA: Insufficient documentation

## 2020-04-03 DIAGNOSIS — Z7982 Long term (current) use of aspirin: Secondary | ICD-10-CM | POA: Insufficient documentation

## 2020-04-03 DIAGNOSIS — Z8249 Family history of ischemic heart disease and other diseases of the circulatory system: Secondary | ICD-10-CM | POA: Insufficient documentation

## 2020-04-03 DIAGNOSIS — I252 Old myocardial infarction: Secondary | ICD-10-CM | POA: Insufficient documentation

## 2020-04-03 DIAGNOSIS — I2510 Atherosclerotic heart disease of native coronary artery without angina pectoris: Secondary | ICD-10-CM | POA: Insufficient documentation

## 2020-04-03 DIAGNOSIS — I5022 Chronic systolic (congestive) heart failure: Secondary | ICD-10-CM

## 2020-04-03 DIAGNOSIS — D696 Thrombocytopenia, unspecified: Secondary | ICD-10-CM | POA: Insufficient documentation

## 2020-04-03 DIAGNOSIS — I255 Ischemic cardiomyopathy: Secondary | ICD-10-CM | POA: Insufficient documentation

## 2020-04-03 DIAGNOSIS — Z7984 Long term (current) use of oral hypoglycemic drugs: Secondary | ICD-10-CM | POA: Insufficient documentation

## 2020-04-03 DIAGNOSIS — I110 Hypertensive heart disease with heart failure: Secondary | ICD-10-CM | POA: Insufficient documentation

## 2020-04-03 DIAGNOSIS — Z951 Presence of aortocoronary bypass graft: Secondary | ICD-10-CM | POA: Insufficient documentation

## 2020-04-03 DIAGNOSIS — Z8616 Personal history of COVID-19: Secondary | ICD-10-CM | POA: Insufficient documentation

## 2020-04-03 HISTORY — DX: Heart failure, unspecified: I50.9

## 2020-04-03 LAB — BRAIN NATRIURETIC PEPTIDE: B Natriuretic Peptide: 272 pg/mL — ABNORMAL HIGH (ref 0.0–100.0)

## 2020-04-03 LAB — BASIC METABOLIC PANEL
Anion gap: 7 (ref 5–15)
BUN: 23 mg/dL (ref 8–23)
CO2: 25 mmol/L (ref 22–32)
Calcium: 8.9 mg/dL (ref 8.9–10.3)
Chloride: 108 mmol/L (ref 98–111)
Creatinine, Ser: 1.1 mg/dL (ref 0.61–1.24)
GFR, Estimated: >60 mL/min (ref >60)
Glucose, Bld: 96 mg/dL (ref 70–99)
Potassium: 3.9 mmol/L (ref 3.5–5.1)
Sodium: 140 mmol/L (ref 135–145)

## 2020-04-03 LAB — ECHOCARDIOGRAM COMPLETE
Area-P 1/2: 4.06 cm2
S' Lateral: 5.3 cm
Single Plane A4C EF: 43.5 %

## 2020-04-03 MED ORDER — ENTRESTO 24-26 MG PO TABS: 1.0000 | ORAL_TABLET | Freq: Two times a day (BID) | ORAL | 11 refills | Status: DC

## 2020-04-03 MED ORDER — SPIRONOLACTONE 25 MG PO TABS: 25.0000 mg | ORAL_TABLET | Freq: Every day | ORAL | 3 refills | Status: DC

## 2020-04-03 MED ORDER — PERFLUTREN LIPID MICROSPHERE
1.0000 mL | INTRAVENOUS | Status: DC | PRN
  Administered 2020-04-03: 4 mL via INTRAVENOUS
  Filled 2020-04-03: qty 10

## 2020-04-03 MED FILL — ?SPIRONOLACTONE 25 MG TABLE: 25 | 30 days supply | Qty: 30 | Fill #0

## 2020-04-03 MED FILL — ENTRESTO 24 MG-26 MG TABLET: 24-26 | 30 days supply | Qty: 60 | Fill #0

## 2020-04-03 NOTE — Progress Notes (Signed)
  Echocardiogram 2D Echocardiogram has been performed.  Frank Love 04/03/2020, 9:31 AM

## 2020-04-03 NOTE — Patient Instructions (Addendum)
EKG done today  Labs done today. We will contact you only if your labs are abnormal.   START Entresto 24-26(1 tablet) by mouth 2 times daily.  INCREASE Spironolactone to 25mg  (1 tablet) by mouth daily.  No other medication changes were made. Please continue all current medications as prescribed.  Please contact [employer] at [phone] for your [place]. You have coverage until April with them.   Your physician has requested that you have a cardiac MRI. Cardiac MRI uses a computer to create images of your heart as its beating, producing both still and moving pictures of your heart and major blood vessels. For further information please visit [email or web address] This has to be approved through your insurance company prior to scheduling, once approved we will contact you to schedule an appointment.  Your physician recommends that you schedule a follow-up appointment in: 10 days for a lab only appointment, 3 weeks for an appointment with our clinic Pharmacist and in 2 months for an appointment with Dr. Aundra Dubin   If you have any questions or concerns before your next appointment please send [place] a message through [facility] or call our office at [phone].    TO LEAVE A MESSAGE FOR THE NURSE SELECT OPTION 2, PLEASE LEAVE A MESSAGE INCLUDING: . YOUR NAME . DATE OF BIRTH . CALL BACK NUMBER . REASON FOR CALL**this is important as we prioritize the call backs  YOU WILL RECEIVE A CALL BACK THE SAME DAY AS LONG AS YOU CALL BEFORE 4:00 PM   Do the following things EVERYDAY: 1) Weigh yourself in the morning before breakfast. Write it down and keep it in a log. 2) Take your medicines as prescribed 3) Eat low salt foods--Limit salt (sodium) to 2000 mg per day.  4) Stay as active as you can everyday 5) Limit all fluids for the day to less than 2 liters   At the [facility], you and your health needs are our priority. As part of our continuing mission to provide you with exceptional  heart care, we have created designated Provider Care Teams. These Care Teams include your primary Cardiologist (physician) and Advanced Practice Providers (APPs- Physician Assistants and Nurse Practitioners) who all work together to provide you with the care you need, when you need it.   You may see any of the following providers on your designated Care Team at your next follow up: Marland Kitchen Dr Glori Bickers . Dr [name] . Darrick Grinder, NP . Lyda Jester, PA . Audry Riles, PharmD   Please be sure to bring in all your medications bottles to every appointment.

## 2020-04-05 NOTE — Progress Notes (Signed)
PCP:  [name], NP  Cardiologist:  [name], MD HF Cardiology: Dr Aundra Dubin    History of Present Illness: Frank Love is a 65 y.o. male with a history of HTN, LE edema, and elevated BNP who returns for followup of CHF and CAD.   He initially was seen by PCP in 5/20 with elevated BNP and was started on lasix with cardiology referral, lasix was subsequently increased to 40 mg bid due to ongoing LE edema. He underwent cardiac evaluation for right hip surgery in September with continued symptoms of LE edema on lasix 40 bid. Echo in 0/62 showed systolic HF with EF 69-48%. He was started on entresto, metoprolol at this time.   He underwent cath 11/20 which showed multivessel disease, had CABGx4 12/12/18.  Repeat echo done post-CABG 12/17/18 showed severely decreased right ventricular systolic function with severe enlargement in addition to small pericardial effusion, otherwise similar to previous. VQ scan was negative for PE. Transferred to CIR for rehab.   He had right THR done in 5/46 with no complications.   Echo repeated 03/2019, EF 35-40%, mild LVH, mild LV dilation, moderately decreased RV systolic function, normal RV size.   Admitted to [facility] 11/19-12/10/21 for COVID-19 viral pneumonia requiring up to 30 L of HFNC. Received remdesivir x 5 days, steroid taper, tocilizumab/baricitinib. Hospitalization complicated by development of severe orthostatic hypotension and thrombocytopenia. Echo 12/14/19 with EF 45-50%. Heart Failure team consulted for on-going hypotension and bradycardia. Beta-blocker held, midodrine 15 mg tid started, and abdominal binder and TED hose applied. Patient discharged home off all GDMT medications due to hypotension and bradycardia.   Echo was done today and reviewed: EF 35-40%, diffuse hypokinesis, moderately decreased RV systolic function with normal RV size.  Technically difficult study.    He returns for followup of CHF.  He was volume overloaded at  last appointment.  Lasix was increased. Weight is down 8 lbs.  Feels much better.  No exertional dyspnea.  No problems walking up stairs.  No orthopnea/PND.  No chest pain.   ECG: NSR, old inferior MI (personally reviewed)  Labs (2/21): K 4.7, creatinine 1.07, LDL 61 Labs (3/21): K 4.3, creatinine 1.05, digoxin 0.9 Labs (5/21): K 4.3, creatinine 1.25  Labs (12/21): K 4.2 => 3.5, creatinine 1.17 => 0.91, plts 60K Labs (2/22): LDL 113, HDL 40 Labs (3/22): K 3.9, creatinine 1.12  PMH: 1. OA: s/p right THR.  2. HTN 3. Carotid stenosis: Carotid dopplers (11/20) with 40-59% RICA stenosis.  - Carotid dopplers (12/21)  right ICA 40-59% stenosis, left ICA mild stenosis.  4. CAD: LHC in 11/20 with totally occluded OM, totally occluded mid LAD, totally occluded mid RCA.  - CABG 11/20 with LIMA-LAD, RIMA-PDA, SVG-OM1, SVG-AM 5. Chronic systolic CHF: Ischemic cardiomyopathy.  - Echo (9/20): EF 30-35%, mildly decreased RV systolic function.  - Echo (11/20): EF 30-35%, severely dilated RV with severely decreased systolic function. - Echo (3/21): EF 35-40%, mild LV dilation with mild LVH, moderately decreased RV systolic function with normal RV size, PASP 33 mmHg.   - Echo (11/21): LVEF improved to 45-50%. RV mildly reduced  - Echo (3/22): EF 35-40%, diffuse hypokinesis, moderately decreased RV systolic function with normal RV size.   6. V/Q scan negative for PE in 11/20.  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  8. COVID-19 11/21  Past Surgical History:  Procedure Laterality Date  . CIRCUMCISION N/A 10/12/2018   Procedure: CIRCUMCISION ADULT;  Surgeon: Franchot Gallo, MD;  Location: [facility];  Service: Urology;  Laterality: N/A;  45 mins  . CO2 LASER APPLICATION N/A 03/31/486   Procedure: CO2 LASER APPLICATION;  Surgeon: Franchot Gallo, MD;  Location: [facility];  Service: Urology;  Laterality: N/A;  30 MINS  . COLONOSCOPY    . CORONARY ARTERY BYPASS GRAFT N/A 12/12/2018   Procedure: CORONARY ARTERY  BYPASS GRAFTING (CABG) x four, using bilateral internal mammary arteries and right leg greater saphenous vein harvested endoscopically;  Surgeon: [name], MD;  Location: [place];  Service: Open Heart Surgery;  Laterality: N/A;  . LEFT HEART CATH AND CORONARY ANGIOGRAPHY N/A 11/28/2018   Procedure: LEFT HEART CATH AND CORONARY ANGIOGRAPHY;  Surgeon: Burnell Blanks, MD;  Location: [place] [facility];  Service: Cardiovascular;  Laterality: N/A;  . MEDIASTINAL EXPLORATION N/A 12/12/2018   Procedure: Mediastinal Re-Exploration after bypass graft;  Surgeon: [name], MD;  Location: [place];  Service: Open Heart Surgery;  Laterality: N/A;  . TEE WITHOUT CARDIOVERSION N/A 12/12/2018   Procedure: TRANSESOPHAGEAL ECHOCARDIOGRAM (TEE);  Surgeon: [name], MD;  Location: [place];  Service: Open Heart Surgery;  Laterality: N/A;  . TOOTH EXTRACTION    . TOTAL HIP ARTHROPLASTY Right 03/08/2019   Procedure: RIGHT TOTAL HIP ARTHROPLASTY ANTERIOR APPROACH;  Surgeon: Mcarthur Rossetti, MD;  Location: [facility];  Service: Orthopedics;  Laterality: Right;    Current Outpatient Medications  Medication Sig Dispense Refill  . albuterol (VENTOLIN HFA) 108 (90 Base) MCG/ACT inhaler Inhale 2 puffs into the lungs every 6 (six) hours as needed for wheezing or shortness of breath. 6.7 g 0  . aspirin EC 81 MG tablet Take 1 tablet (81 mg total) by mouth at bedtime. 30 tablet 0  . atorvastatin (LIPITOR) 40 MG tablet Take 1 tablet (40 mg total) by mouth daily.    . dapagliflozin propanediol (FARXIGA) 10 MG TABS tablet Take 1 tablet (10 mg total) by mouth daily before breakfast. 30 tablet 11  . furosemide (LASIX) 40 MG tablet TAKE 1 TABLET (40 MG TOTAL) BY MOUTH DAILY. . 30 tablet 3  . nitroGLYCERIN (NITROSTAT) 0.4 MG SL tablet Place 1 tablet (0.4 mg total) under the tongue every 5 (five) minutes as needed for chest pain. NO more than 3 pills--if you have the need to take 2 pills or more call MD 30  tablet 0  . potassium chloride SA (KLOR-CON) 20 MEQ tablet Take 1 tablet (20 mEq total) by mouth daily. 90 tablet 3  . sacubitril-valsartan (ENTRESTO) 24-26 MG Take 1 tablet by mouth 2 (two) times daily. 60 tablet 11  . spironolactone (ALDACTONE) 25 MG tablet Take 1 tablet (25 mg total) by mouth daily. 90 tablet 3   No current facility-administered medications for this encounter.    Allergies:   Patient has no known allergies.   Social History:  The patient  reports that he has never smoked. He has never used smokeless tobacco. He reports that he does not drink alcohol and does not use drugs.   Family History:  The patient's family history includes Diabetes in his mother; Heart disease in his mother.   ROS:  Please see the history of present illness.   All other systems are personally reviewed and negative.   Exam:  BP 138/70   Pulse 65   Wt 119.2 kg (262 lb 12.8 oz)   SpO2 98%   BMI 33.74 kg/m    Physical Exam  General: NAD Neck: No JVD, no thyromegaly or thyroid nodule.  Lungs: Clear to auscultation bilaterally with normal respiratory effort. CV: Nondisplaced PMI.  Heart regular S1/S2, no S3/S4, no murmur.  No peripheral edema.  No carotid bruit.  Normal pedal pulses.  Abdomen: Soft, nontender, no hepatosplenomegaly, no distention.  Skin: Intact without lesions or rashes.  Neurologic: Alert and oriented x 3.  Psych: Normal affect. Extremities: No clubbing or cyanosis.  HEENT: Normal.   Recent Labs: 12/23/2019: TSH 0.059 12/27/2019: Magnesium 1.9 01/01/2020: ALT 91 03/16/2020: Hemoglobin 12.4; Platelets 231 04/03/2020: B Natriuretic Peptide 272.0; BUN 23; Creatinine, Ser 1.10; Potassium 3.9; Sodium 140  Personally reviewed   Wt Readings from Last 3 Encounters:  04/03/20 119.2 kg (262 lb 12.8 oz)  03/16/20 122.8 kg (270 lb 12.8 oz)  01/21/20 116.2 kg (256 lb 3.2 oz)    ASSESSMENT AND PLAN:  1. CAD: S/p CABG in 11/20. No chest pain.  - Continue ASA 81 daily.  -  Continue atorvastatin, he recently restarted this medication, repeat lipids in 2 months.  2. Chronic systolic CHF: Ischemic cardiomyopathy. Pre-CABG echo with EF 30-35%, mild RV dysfunction. Post-CABG echo (11/20) with EF 30-35%, severe RV systolic dysfunction. V/Q scan done, no evidence for PE.Hypotensive post-CABG requiring pressors and then midodrine, but improved over time. Echo repeated 3/21, EF 35-40% with moderate RV dysfunction, GIIDD.Cardiac MRIwas recommendedto quantify EF more exactly for purposes of ICD determination, however pt was adamant that he did not want an ICD and declinedcMRI. Echo repeated 11/21 LVEF improved to 45-50%, RV mildly reduced. No longer ICD candidate.  After COVID-19 infection in 11/21, patient developed severe orthostatic hypotension and was taken off all his cardiac meds.  He developed volume overload.  He is now off midodrine with resolution of orthostatic hypotension.  Echo in 3/22 was a technically difficult study, LV EF around 35-40% with moderate RV dysfunction. Volume status much improved, NYHA class II.  - Continue Lasix 40 mg daily.  - Continue  Farxiga 10 mg daily.  - Increase spironolactone to 25 mg daily.  D/c KCl.  - Start Entresto 24/26 bid.  BMET today and again in 10 days.  - Echoes have been difficult, EF seems to be in the 35% range.  I will arrange for cardiac MRI to be done to quantify EF more exactly, ?ICD candidate.  3. Carotid stenosis: Carotid dopplers (12/21) right ICA 40-59% stenosis, left ICA are consistent with a 1-39% stenosis.  - Repeat in 1 year (12/22).  - Continue ASA and statin.  4. Chronic thrombocytopenia: Referred to hematology for evaluation.   Followup in 3 wks with HF pharmacist for med titration, see me in 2 months.   Signed, [name], MD  04/05/2020    [facility] [address] [facility] [place] [address] [phone] (office) [phone] (fax)

## 2020-04-10 ENCOUNTER — Telehealth (HOSPITAL_COMMUNITY): Payer: Self-pay | Admitting: Pharmacy Technician

## 2020-04-10 NOTE — Telephone Encounter (Signed)
Sent in [employer] application via fax.  Will follow up.  

## 2020-04-13 ENCOUNTER — Ambulatory Visit (HOSPITAL_COMMUNITY)
Admission: RE | Admit: 2020-04-13 | Discharge: 2020-04-13 | Disposition: A | Discharge status: Home / Self Care | Payer: Self-pay | Source: Ambulatory Visit | Attending: Internal Medicine | Admitting: Internal Medicine

## 2020-04-13 ENCOUNTER — Other Ambulatory Visit: Payer: Self-pay

## 2020-04-13 DIAGNOSIS — I5022 Chronic systolic (congestive) heart failure: Secondary | ICD-10-CM | POA: Insufficient documentation

## 2020-04-13 LAB — BASIC METABOLIC PANEL
Anion gap: 8 (ref 5–15)
BUN: 22 mg/dL (ref 8–23)
CO2: 24 mmol/L (ref 22–32)
Calcium: 9.1 mg/dL (ref 8.9–10.3)
Chloride: 107 mmol/L (ref 98–111)
Creatinine, Ser: 1.05 mg/dL (ref 0.61–1.24)
GFR, Estimated: >60 mL/min (ref >60)
Glucose, Bld: 105 mg/dL — ABNORMAL HIGH (ref 70–99)
Potassium: 3.9 mmol/L (ref 3.5–5.1)
Sodium: 139 mmol/L (ref 135–145)

## 2020-04-14 MED FILL — ?FUROSEMIDE 40 MG TABLET: 40 | 30 days supply | Qty: 30 | Fill #0

## 2020-04-22 ENCOUNTER — Encounter (HOSPITAL_COMMUNITY): Payer: Self-pay | Admitting: Cardiology

## 2020-04-25 ENCOUNTER — Other Ambulatory Visit: Payer: Self-pay

## 2020-04-29 ENCOUNTER — Telehealth (HOSPITAL_COMMUNITY): Payer: Self-pay | Admitting: Pharmacy Technician

## 2020-04-29 NOTE — Telephone Encounter (Signed)
Sent in [employer] application via fax.  Will follow up.  

## 2020-04-30 ENCOUNTER — Other Ambulatory Visit: Payer: Self-pay

## 2020-04-30 MED FILL — Atorvastatin Calcium Tab 40 MG (Base Equivalent): ORAL | 30 days supply | Qty: 30 | Fill #0 | Status: AC

## 2020-04-30 MED FILL — Spironolactone Tab 25 MG: ORAL | 30 days supply | Qty: 30 | Fill #0 | Status: AC

## 2020-05-01 ENCOUNTER — Other Ambulatory Visit: Payer: Self-pay

## 2020-05-05 NOTE — Progress Notes (Incomplete)
***In Progress*** PCP:  [name], NP   Cardiologist:  [name], MD HF Cardiology: Dr Aundra Dubin   HPI:  Frank Love is a 65 y.o. male with a history of HTN, LE edema, and elevated BNP who returns for follow-up of CHF and CAD.   He initially was seen by PCP in 05/2018 with elevated BNP and was started on furosemide with cardiology referral. Furosemide was subsequently increased to 40 mg BID due to ongoing LE edema. He underwent cardiac evaluation for right hip surgery in September with continued symptoms of LE edema on furosemide 40 mg BID. Echo in 08/1189 showedsystolic HFwith EF 47-82%.He was started on Entresto and metoprolol tartrate at this time.  He underwent cath 11/2018 which showedmultivessel disease, had CABGx4 12/12/18.  Repeat echo done post-CABG 12/17/18 showed severely decreased right ventricular systolic function with severe enlargement in addition to small pericardial effusion, otherwise similar to previous. VQ scan was negative for PE. Transferred to CIR for rehab.   He had right THR done in 09/5619 with no complications.   Echo repeated 03/2019, EF 35-40%, mild LVH, mild LV dilation, moderately decreased RV systolic function, normal RV size.   Admitted to [facility] 11/19-12/10/21 for COVID-19 viral pneumonia requiring up to 30 L of HFNC. Received remdesivir x 5 days, steroid taper, tocilizumab/baricitinib. Hospitalization complicated by development of severe orthostatic hypotension and thrombocytopenia. Echo 12/14/19 with EF 45-50%. Heart Failure team consulted for on-going hypotension and bradycardia. Beta-blocker held, midodrine 15 mg TID started, and abdominal binder and TED hose applied. Patient discharged home off all GDMT medications due to hypotension and bradycardia.    Echo was done on 04/03/20 and reviewed: EF 35-40%, diffuse hypokinesis, moderately decreased RV systolic function with normal RV size.  Technically difficult study.  He returned for  followup of CHF on 04/03/20.  He was volume overloaded at previous appointment and furosemide was increased. Weight was down 8 lbs.  Was feeling much better.  No exertional dyspnea.  No problems walking up stairs.  No orthopnea/PND.  No chest pain.  Today he returns to HF clinic for pharmacist medication titration. At last visit with MD, [name] was restarted at 24/26 mg BID. Additionally, spironolactone was increased to 25 mg daily and potassium supplement was discontinued. ***    Overall feeling ***. Dizziness, lightheadedness, fatigue:  Chest pain or palpitations:  How is your breathing?: *** SOB: Able to complete all ADLs. Activity level ***  Weight at home pounds. Takes furosemide/torsemide/bumex *** mg *** daily.  LEE PND/Orthopnea  Appetite *** Low-salt diet:   Physical Exam Cost/affordability of meds    -[employer] and AZ[employer]Me applications sent, pending -no labs today (last BMET 3/21, stable after restarting Entresto) -incr Entresto to 49/51 mg BID - if BP room, repeat BMET at next visit on 5/13 -restart carvedilol 3.125 mg BID if dry? - previously stopped 11/2019 d/t low BP/HR, better now -f/u with Dr. Aundra Dubin on 5/13     HF Medications: Entresto 24/26 mg BID Spironolactone 25 mg daily Farxiga 10 mg daily Furosemide 40 mg daily  Has the patient been experiencing any side effects to the medications prescribed?  {YES NO:22349}  Does the patient have any problems obtaining medications due to transportation or finances?   {YES NO:22349}  Understanding of regimen: {excellent/good/fair/poor:19665} Understanding of indications: {excellent/good/fair/poor:19665} Potential of compliance: {excellent/good/fair/poor:19665} Patient understands to avoid NSAIDs. Patient understands to avoid decongestants.    Pertinent Lab Values: . 04/13/20: Serum creatinine 1.05, Potassium 3.9  Vital Signs: . Weight: *** (  last clinic weight: ***) . Blood pressure: ***  . Heart rate: ***    Assessment/Plan: 1. CAD: S/p CABG in 11/2018. No chest pain.  - Continue ASA 81 daily.  - Continue atorvastatin, he recently restarted this medication, repeat lipids in 2 months.  2. Chronic systolic CHF: Ischemic cardiomyopathy. Pre-CABG echo with EF 30-35%, mild RV dysfunction. Post-CABG echo (11/20) with EF 30-35%, severe RV systolic dysfunction. V/Q scan done, no evidence for PE.Hypotensive post-CABG requiring pressors and then midodrine, but improved over time. Echo repeated 3/21, EF 35-40% with moderate RV dysfunction, GIIDD.Cardiac MRIwas recommendedto quantify EF more exactly for purposes of ICD determination, however pt was adamant that he did not want an ICD and declinedcMRI. Echo repeated 11/21 LVEF improved to 45-50%, RV mildly reduced. No longer ICD candidate.  After COVID-19 infection in 11/21, patient developed severe orthostatic hypotension and was taken off all his cardiac meds.  He developed volume overload.  He is now off midodrine with resolution of orthostatic hypotension.  Echo in 3/22 was a technically difficult study, LV EF around 35-40% with moderate RV dysfunction. Volume status much improved, NYHA class II.  - Continue furosemide 40 mg daily.  - Continue Entresto 24/26 mg BID. *** - Continue spironolactone to 25 mg daily.  - Continue Farxiga 10 mg daily.  - Echoes have been difficult, EF seems to be in the 35% range.  Cardiac MRI may be needed to quantify EF more exactly, possible ICD candidate.  3. Carotid stenosis: Carotid dopplers (12/2019) right ICA 40-59% stenosis, left ICA are consistent with a 1-39% stenosis.  - Repeat in 1 year (12/2020).  - Continue ASA and statin.  4. Chronic thrombocytopenia: Referred to hematology for evaluation.     Esmeralda Links (PharmD Candidate 2022)  Audry Riles, PharmD, BCPS, BCCP, CPP Heart Failure Clinic Pharmacist [phone]

## 2020-05-06 ENCOUNTER — Other Ambulatory Visit: Payer: Self-pay

## 2020-05-06 ENCOUNTER — Ambulatory Visit (HOSPITAL_COMMUNITY)
Admission: RE | Admit: 2020-05-06 | Discharge: 2020-05-06 | Disposition: A | Discharge status: Home / Self Care | Payer: Self-pay | Source: Ambulatory Visit | Attending: Internal Medicine | Admitting: Internal Medicine

## 2020-05-06 ENCOUNTER — Telehealth (HOSPITAL_COMMUNITY): Payer: Self-pay | Admitting: Pharmacy Technician

## 2020-05-06 VITALS — BP 120/76 | HR 64 | Wt 272.4 lb

## 2020-05-06 DIAGNOSIS — I110 Hypertensive heart disease with heart failure: Secondary | ICD-10-CM | POA: Insufficient documentation

## 2020-05-06 DIAGNOSIS — I2510 Atherosclerotic heart disease of native coronary artery without angina pectoris: Secondary | ICD-10-CM | POA: Insufficient documentation

## 2020-05-06 DIAGNOSIS — M7989 Other specified soft tissue disorders: Secondary | ICD-10-CM | POA: Insufficient documentation

## 2020-05-06 DIAGNOSIS — I5022 Chronic systolic (congestive) heart failure: Secondary | ICD-10-CM | POA: Insufficient documentation

## 2020-05-06 DIAGNOSIS — Z951 Presence of aortocoronary bypass graft: Secondary | ICD-10-CM | POA: Insufficient documentation

## 2020-05-06 DIAGNOSIS — I255 Ischemic cardiomyopathy: Secondary | ICD-10-CM | POA: Insufficient documentation

## 2020-05-06 DIAGNOSIS — D696 Thrombocytopenia, unspecified: Secondary | ICD-10-CM | POA: Insufficient documentation

## 2020-05-06 DIAGNOSIS — Z79899 Other long term (current) drug therapy: Secondary | ICD-10-CM | POA: Insufficient documentation

## 2020-05-06 DIAGNOSIS — Z8616 Personal history of COVID-19: Secondary | ICD-10-CM | POA: Insufficient documentation

## 2020-05-06 MED ORDER — NITROGLYCERIN 0.4 MG SL SUBL
0.4000 mg | SUBLINGUAL_TABLET | SUBLINGUAL | 3 refills | Status: AC | PRN
Start: 2020-05-06 — End: ?
  Filled 2020-05-06: qty 25, 25d supply, fill #0

## 2020-05-06 MED ORDER — FUROSEMIDE 40 MG PO TABS
20.0000 mg | ORAL_TABLET | Freq: Every day | ORAL | 11 refills | Status: DC
  Filled 2020-05-06: qty 15, 30d supply, fill #0

## 2020-05-06 MED ORDER — SACUBITRIL-VALSARTAN 49-51 MG PO TABS: 1.0000 | ORAL_TABLET | Freq: Two times a day (BID) | ORAL | 3 refills | Status: DC

## 2020-05-06 NOTE — Patient Instructions (Addendum)
It was a pleasure seeing you today!  MEDICATIONS: -We are changing your medications today  -Increase Entresto to 49/51 mg (1 tablet) twice daily. You may take 2 tablets of the 24/26 mg strength twice daily until you receive the new strength.  -Decrease furosemide (Lasix) to 20 mg (1/2 tab) daily -Call if you have questions about your medications.   NEXT APPOINTMENT: Return to clinic in 4 weeks with Dr. Aundra Dubin.  In general, to take care of your heart failure: -Limit your fluid intake to 2 Liters (half-gallon) per day.   -Limit your salt intake to ideally 2-3 grams (2000-3000 mg) per day. -Weigh yourself daily and record, and bring that "weight diary" to your next appointment.  (Weight gain of 2-3 pounds in 1 day typically means fluid weight.) -The medications for your heart are to help your heart and help you live longer.   -Please contact us before stopping any of your heart medications.  Call the clinic at [phone] with questions or to reschedule future appointments.

## 2020-05-06 NOTE — Progress Notes (Addendum)
PCP:  [name], NP   Cardiologist:  [name], MD HF Cardiology: Dr Aundra Dubin   HPI:  Frank Love is a 65 y.o. male with a history of HTN, LE edema, and elevated BNP who returns for follow-up of CHF and CAD.   He initially was seen by PCP in 05/2018 with elevated BNP and was started on furosemide with cardiology referral. Furosemide was subsequently increased to 40 mg BID due to ongoing LE edema. He underwent cardiac evaluation for right hip surgery in September with continued symptoms of LE edema on furosemide 40 mg BID. Echo in 05/3297 showedsystolic HFwith EF 24-26%.He was started on Entresto and metoprolol tartrate at this time.  He underwent cath 11/2018 which showedmultivessel disease, had CABGx4 12/12/18.  Repeat echo done post-CABG 12/17/18 showed severely decreased right ventricular systolic function with severe enlargement in addition to small pericardial effusion, otherwise similar to previous. VQ scan was negative for PE. Transferred to CIR for rehab.   He had right THR done in 08/3417 with no complications.   Echo repeated 03/2019, EF 35-40%, mild LVH, mild LV dilation, moderately decreased RV systolic function, normal RV size.   Admitted to [facility] 11/19-12/10/21 for COVID-19 viral pneumonia requiring up to 30 L of HFNC. Received remdesivir x 5 days, steroid taper, tocilizumab/baricitinib. Hospitalization complicated by development of severe orthostatic hypotension and thrombocytopenia. Echo 12/14/19 with EF 45-50%. Heart Failure team consulted for on-going hypotension and bradycardia. Beta-blocker held, midodrine 15 mg TID started, and abdominal binder and TED hose applied. Patient discharged home off all GDMT medications due to hypotension and bradycardia.    Echo was done on 04/03/20 and reviewed: EF 35-40%, diffuse hypokinesis, moderately decreased RV systolic function with normal RV size.  Technically difficult study.  He returned for followup of CHF on  04/03/20.  He was volume overloaded at previous appointment and furosemide was increased. Weight was down 8 lbs.  Was feeling much better.  No exertional dyspnea.  No problems walking up stairs.  No orthopnea/PND.  No chest pain.  Today he returns to HF clinic for pharmacist medication titration. At last visit with MD, [name] was restarted at 24/26 mg BID. Additionally, spironolactone was increased to 25 mg daily and potassium supplement was discontinued. Overall patient is feeling great. Denies dizziness, lightheadedness, fatigue, chest pain, or palpitations. His breathing has also been great. He may get SOB only if walking uphill for longer distances (~300 yards), but has no issues walking on flat ground. Patient reports LEE has improved significantly since restarting [place] and Entresto. There is some mild residual swelling in his feet/ankles especially on the left side. Currently taking furosemide 40 mg daily. Patient's appetite has been good. He is trying to eat less salt but this is difficult when he eats out. Patient is taking all of his medications.  HF Medications: Entresto 24/26 mg BID Spironolactone 25 mg daily Farxiga 10 mg daily Furosemide 40 mg daily  Has the patient been experiencing any side effects to the medications prescribed?  No   Does the patient have any problems obtaining medications due to transportation or finances?   No, patient is uninsured but has been approved for patient assistance for Entresto through [employer] and [place] through [employer].  Understanding of regimen: good Understanding of indications: good Potential of compliance: good Patient understands to avoid NSAIDs. Patient understands to avoid decongestants.    Pertinent Lab Values: . 04/13/20: Serum creatinine 1.05, Potassium 3.9  Vital Signs: . Weight: 272 lbs (last clinic weight:  262 lbs) . Blood pressure: 120/76  . Heart rate: 64   Assessment/Plan: 1. CAD: S/p CABG in 11/2018. No chest pain.  -  Continue ASA 81 daily.  - Continue atorvastatin, he recently restarted this medication, repeat lipids next month.  2. Chronic systolic CHF: Ischemic cardiomyopathy. Pre-CABG echo with EF 30-35%, mild RV dysfunction. Post-CABG echo (11/2018) with EF 30-35%, severe RV systolic dysfunction. V/Q scan done, no evidence for PE.Hypotensive post-CABG requiring pressors and then midodrine, but improved over time. Echo repeated 03/2019, EF 35-40% with moderate RV dysfunction, GIIDD.Cardiac MRIwas recommendedto quantify EF more exactly for purposes of ICD determination, however pt was adamant that he did not want an ICD and declinedcMRI. Echo repeated 11/2019 LVEF improved to 45-50%, RV mildly reduced. No longer ICD candidate.  After COVID-19 infection in 11/2019, patient developed severe orthostatic hypotension and was taken off all his cardiac meds.  He developed volume overload.  He is now off midodrine with resolution of orthostatic hypotension.  Echo in 03/2020 was a technically difficult study, LVEF around 35-40% with moderate RV dysfunction.  - Volume status stable, NYHA class II.  - Decrease furosemide to 20 mg daily.  - Increase Entresto to 49/51 mg BID. Repeat BMET in 4 weeks. - Continue spironolactone to 25 mg daily.  - Continue Farxiga 10 mg daily.  - Echoes have been difficult, EF seems to be in the 35% range.  Cardiac MRI may be needed to quantify EF more exactly, possible ICD candidate.  3. Carotid stenosis: Carotid dopplers (12/2019) right ICA 40-59% stenosis, left ICA are consistent with a 1-39% stenosis.  - Repeat in 1 year (12/2020).  - Continue ASA and statin.  4. Chronic thrombocytopenia: Referred to hematology for evaluation.  Patient will follow up with Dr. Aundra Dubin on 06/05/20. Plan to check BMET then.    Esmeralda Links (PharmD Candidate 2022)  Audry Riles, PharmD, BCPS, BCCP, CPP Heart Failure Clinic Pharmacist [phone]

## 2020-05-06 NOTE — Telephone Encounter (Signed)
Advanced Heart Failure Patient Advocate Encounter   Patient was approved to receive Entresto from [employer]  Patient ID: [id number] Effective dates: 04/13/20 through 04/14/21  Patient received first shipment.  Advanced Heart Failure Patient Advocate Encounter   Patient was approved to receive Farxiga from [employer]  Patient ID: [id number] Effective dates: 05/06/20 through 05/05/21  Patient informed about approval in office.  Charlann Boxer, CPhT

## 2020-05-07 ENCOUNTER — Other Ambulatory Visit: Payer: Self-pay

## 2020-05-12 ENCOUNTER — Other Ambulatory Visit: Payer: Self-pay

## 2020-06-03 ENCOUNTER — Other Ambulatory Visit: Payer: Self-pay

## 2020-06-03 ENCOUNTER — Other Ambulatory Visit (HOSPITAL_COMMUNITY): Payer: Self-pay | Admitting: Cardiology

## 2020-06-03 MED FILL — Spironolactone Tab 25 MG: ORAL | 30 days supply | Qty: 30 | Fill #1 | Status: AC

## 2020-06-03 MED FILL — Atorvastatin Calcium Tab 40 MG (Base Equivalent): ORAL | 30 days supply | Qty: 30 | Fill #0 | Status: AC

## 2020-06-05 ENCOUNTER — Other Ambulatory Visit: Payer: Self-pay

## 2020-06-05 ENCOUNTER — Ambulatory Visit (HOSPITAL_COMMUNITY)
Admission: RE | Admit: 2020-06-05 | Discharge: 2020-06-05 | Disposition: A | Discharge status: Home / Self Care | Payer: Self-pay | Source: Ambulatory Visit | Attending: Cardiology | Admitting: Cardiology

## 2020-06-05 ENCOUNTER — Encounter (HOSPITAL_COMMUNITY): Payer: Self-pay | Admitting: Cardiology

## 2020-06-05 VITALS — BP 118/70 | HR 66 | Wt 281.0 lb

## 2020-06-05 DIAGNOSIS — Z8249 Family history of ischemic heart disease and other diseases of the circulatory system: Secondary | ICD-10-CM | POA: Insufficient documentation

## 2020-06-05 DIAGNOSIS — Z951 Presence of aortocoronary bypass graft: Secondary | ICD-10-CM | POA: Insufficient documentation

## 2020-06-05 DIAGNOSIS — Z79899 Other long term (current) drug therapy: Secondary | ICD-10-CM | POA: Insufficient documentation

## 2020-06-05 DIAGNOSIS — Z8616 Personal history of COVID-19: Secondary | ICD-10-CM | POA: Insufficient documentation

## 2020-06-05 DIAGNOSIS — D696 Thrombocytopenia, unspecified: Secondary | ICD-10-CM | POA: Insufficient documentation

## 2020-06-05 DIAGNOSIS — I110 Hypertensive heart disease with heart failure: Secondary | ICD-10-CM | POA: Insufficient documentation

## 2020-06-05 DIAGNOSIS — I2510 Atherosclerotic heart disease of native coronary artery without angina pectoris: Secondary | ICD-10-CM | POA: Insufficient documentation

## 2020-06-05 DIAGNOSIS — I255 Ischemic cardiomyopathy: Secondary | ICD-10-CM | POA: Insufficient documentation

## 2020-06-05 DIAGNOSIS — I5022 Chronic systolic (congestive) heart failure: Secondary | ICD-10-CM | POA: Insufficient documentation

## 2020-06-05 DIAGNOSIS — Z7982 Long term (current) use of aspirin: Secondary | ICD-10-CM | POA: Insufficient documentation

## 2020-06-05 LAB — LIPID PANEL
Cholesterol: 124 mg/dL (ref 0–200)
HDL: 44 mg/dL (ref >40)
LDL Cholesterol: 66 mg/dL (ref 0–99)
Total CHOL/HDL Ratio: 2.8 RATIO
Triglycerides: 72 mg/dL (ref <150)
VLDL: 14 mg/dL (ref 0–40)

## 2020-06-05 LAB — BASIC METABOLIC PANEL
Anion gap: 7 (ref 5–15)
BUN: 21 mg/dL (ref 8–23)
CO2: 24 mmol/L (ref 22–32)
Calcium: 8.7 mg/dL — ABNORMAL LOW (ref 8.9–10.3)
Chloride: 109 mmol/L (ref 98–111)
Creatinine, Ser: 1.11 mg/dL (ref 0.61–1.24)
GFR, Estimated: >60 mL/min (ref >60)
Glucose, Bld: 106 mg/dL — ABNORMAL HIGH (ref 70–99)
Potassium: 4 mmol/L (ref 3.5–5.1)
Sodium: 140 mmol/L (ref 135–145)

## 2020-06-05 LAB — BRAIN NATRIURETIC PEPTIDE: B Natriuretic Peptide: 274.3 pg/mL — ABNORMAL HIGH (ref 0.0–100.0)

## 2020-06-05 MED ORDER — ASPIRIN EC 81 MG PO TBEC: 81.0000 mg | DELAYED_RELEASE_TABLET | Freq: Every day | ORAL | 3 refills | Status: AC

## 2020-06-05 MED ORDER — FUROSEMIDE 40 MG PO TABS
40.0000 mg | ORAL_TABLET | Freq: Every day | ORAL | 11 refills | Status: DC
  Filled 2020-06-05: qty 30, 30d supply, fill #0
  Filled 2020-08-03: qty 30, 30d supply, fill #1
  Filled 2020-10-01: qty 30, 30d supply, fill #2
  Filled 2020-10-29: qty 30, 30d supply, fill #3
  Filled 2020-12-01: qty 30, 30d supply, fill #4
  Filled 2021-01-18: qty 30, 30d supply, fill #5
  Filled 2021-02-02: qty 30, 30d supply, fill #6
  Filled 2021-02-02 – 2021-03-16 (×2): qty 30, 30d supply, fill #0

## 2020-06-05 MED ORDER — CARVEDILOL 3.125 MG PO TABS
3.1250 mg | ORAL_TABLET | Freq: Two times a day (BID) | ORAL | 3 refills | Status: DC
  Filled 2020-06-05: qty 60, 30d supply, fill #0
  Filled 2020-07-02: qty 60, 30d supply, fill #1
  Filled 2020-08-03: qty 60, 30d supply, fill #2
  Filled 2020-09-01: qty 60, 30d supply, fill #3

## 2020-06-05 NOTE — Patient Instructions (Signed)
Start Aspirin 81 mg Daily  Start Carvedilol 3.125 mg Twice daily   Increase Furosemide to 40 mg Daily  Labs done today, your results will be available in MyChart, we will contact you for abnormal readings.  Your physician recommends that you return for lab work in: 1 week  Please follow up with our heart failure pharmacist in 4 weeks  Your physician recommends that you schedule a follow-up appointment in: 3 months  If you have any questions or concerns before your next appointment please send [place] a message through [place] or call our office at [phone].    TO LEAVE A MESSAGE FOR THE NURSE SELECT OPTION 2, PLEASE LEAVE A MESSAGE INCLUDING: . YOUR NAME . DATE OF BIRTH . CALL BACK NUMBER . REASON FOR CALL**this is important as we prioritize the call backs  [place] AS LONG AS YOU CALL BEFORE 4:00 PM  At the [facility], you and your health needs are our priority. As part of our continuing mission to provide you with exceptional heart care, we have created designated Provider Care Teams. These Care Teams include your primary Cardiologist (physician) and Advanced Practice Providers (APPs- Physician Assistants and Nurse Practitioners) who all work together to provide you with the care you need, when you need it.   You may see any of the following providers on your designated Care Team at your next follow up: Marland Kitchen Dr Glori Bickers . Dr [name] . Dr Vickki Muff . Darrick Grinder, NP . Lyda Jester, [place] . Audry Riles, PharmD   Please be sure to bring in all your medications bottles to every appointment.

## 2020-06-07 NOTE — Progress Notes (Signed)
PCP:  [name], NP  Cardiologist:  [name], MD HF Cardiology: Dr Aundra Dubin    History of Present Illness: Frank Love is a 65 y.o. male with a history of HTN, LE edema, and elevated BNP who returns for followup of CHF and CAD.   He initially was seen by PCP in 5/20 with elevated BNP and was started on lasix with cardiology referral, lasix was subsequently increased to 40 mg bid due to ongoing LE edema. He underwent cardiac evaluation for right hip surgery in September with continued symptoms of LE edema on lasix 40 bid. Echo in 0/97 showed systolic HF with EF 35-32%. He was started on entresto, metoprolol at this time.   He underwent cath 11/20 which showed multivessel disease, had CABGx4 12/12/18.  Repeat echo done post-CABG 12/17/18 showed severely decreased right ventricular systolic function with severe enlargement in addition to small pericardial effusion, otherwise similar to previous. VQ scan was negative for PE. Transferred to CIR for rehab.   He had right THR done in 9/92 with no complications.   Echo repeated 03/2019, EF 35-40%, mild LVH, mild LV dilation, moderately decreased RV systolic function, normal RV size.   Admitted to [facility]/19-12[facility]10/21 for COVID-19 viral pneumonia requiring up to 30 L of HFNC. Received remdesivir x 5 days, steroid taper, tocilizumab/baricitinib. Hospitalization complicated by development of severe orthostatic hypotension and thrombocytopenia. Echo 12/14/19 with EF 45-50%. Heart Failure team consulted for on-going hypotension and bradycardia. Beta-blocker held, midodrine 15 mg tid started, and abdominal binder and TED hose applied. Patient discharged home off all GDMT medications due to hypotension and bradycardia.   Echo in 3/22 showed EF 35-40%, diffuse hypokinesis, moderately decreased RV systolic function with normal RV size.  Technically difficult study.    He returns for followup of CHF.  Weight is up 19 lbs.  He is not  particularly symptomatic, no dyspnea walking up stairs or on flat ground.  No chest pain.  No orthopnea/PND.  No palpitations or lightheadedness.  Says he has been "eating too much."    Labs (2/21): K 4.7, creatinine 1.07, LDL 61 Labs (3/21): K 4.3, creatinine 1.05, digoxin 0.9 Labs (5/21): K 4.3, creatinine 1.25  Labs (12/21): K 4.2 => 3.5, creatinine 1.17 => 0.91, plts 60K Labs (2/22): LDL 113, HDL 40 Labs (3/22): K 3.9, creatinine 1.12, BNP 272  PMH: 1. OA: s/p right THR.  2. HTN 3. Carotid stenosis: Carotid dopplers (11/20) with 40-59% RICA stenosis.  - Carotid dopplers (12/21)  right ICA 40-59% stenosis, left ICA mild stenosis.  4. CAD: LHC in 11/20 with totally occluded OM, totally occluded mid LAD, totally occluded mid RCA.  - CABG 11/20 with LIMA-LAD, RIMA-PDA, SVG-OM1, SVG-AM 5. Chronic systolic CHF: Ischemic cardiomyopathy.  - Echo (9/20): EF 30-35%, mildly decreased RV systolic function.  - Echo (11/20): EF 30-35%, severely dilated RV with severely decreased systolic function. - Echo (3/21): EF 35-40%, mild LV dilation with mild LVH, moderately decreased RV systolic function with normal RV size, PASP 33 mmHg.   - Echo (11/21): LVEF improved to 45-50%. RV mildly reduced  - Echo (3/22): EF 35-40%, diffuse hypokinesis, moderately decreased RV systolic function with normal RV size.   6. V/Q scan negative for PE in 11/20.  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  8. COVID-19 11/21  Past Surgical History:  Procedure Laterality Date  . CIRCUMCISION N/A 10/12/2018   Procedure: CIRCUMCISION ADULT;  Surgeon: Franchot Gallo, MD;  Location: [facility];  Service: Urology;  Laterality: N/A;  45 mins  . CO2 LASER APPLICATION N/A 1/0/1751   Procedure: CO2 LASER APPLICATION;  Surgeon: Franchot Gallo, MD;  Location: [facility];  Service: Urology;  Laterality: N/A;  30 MINS  . COLONOSCOPY    . CORONARY ARTERY BYPASS GRAFT N/A 12/12/2018   Procedure: CORONARY ARTERY BYPASS GRAFTING (CABG) x four,  using bilateral internal mammary arteries and right leg greater saphenous vein harvested endoscopically;  Surgeon: [name], MD;  Location: [place];  Service: Open Heart Surgery;  Laterality: N/A;  . LEFT HEART CATH AND CORONARY ANGIOGRAPHY N/A 11/28/2018   Procedure: LEFT HEART CATH AND CORONARY ANGIOGRAPHY;  Surgeon: Burnell Blanks, MD;  Location: [place] [facility];  Service: Cardiovascular;  Laterality: N/A;  . MEDIASTINAL EXPLORATION N/A 12/12/2018   Procedure: Mediastinal Re-Exploration after bypass graft;  Surgeon: [name], MD;  Location: [place];  Service: Open Heart Surgery;  Laterality: N/A;  . TEE WITHOUT CARDIOVERSION N/A 12/12/2018   Procedure: TRANSESOPHAGEAL ECHOCARDIOGRAM (TEE);  Surgeon: [name], MD;  Location: [place];  Service: Open Heart Surgery;  Laterality: N/A;  . TOOTH EXTRACTION    . TOTAL HIP ARTHROPLASTY Right 03/08/2019   Procedure: RIGHT TOTAL HIP ARTHROPLASTY ANTERIOR APPROACH;  Surgeon: Mcarthur Rossetti, MD;  Location: [facility];  Service: Orthopedics;  Laterality: Right;    Current Outpatient Medications  Medication Sig Dispense Refill  . aspirin EC 81 MG tablet Take 1 tablet (81 mg total) by mouth daily. Swallow whole. 90 tablet 3  . atorvastatin (LIPITOR) 40 MG tablet Take 1 tablet (40 mg total) by mouth daily at 6 PM. 30 tablet 11  . carvedilol (COREG) 3.125 MG tablet Take 1 tablet (3.125 mg total) by mouth 2 (two) times daily. 60 tablet 3  . dapagliflozin propanediol (FARXIGA) 10 MG TABS tablet TAKE 1 TABLET (10 MG TOTAL) BY MOUTH DAILY BEFORE BREAKFAST. 30 tablet 11  . nitroGLYCERIN (NITROSTAT) 0.4 MG SL tablet Place 1 tablet (0.4 mg total) under the tongue every 5 (five) minutes as needed for chest pain. NO more than 3 pills--if you have the need to take 2 pills or more call MD 30 tablet 3  . sacubitril-valsartan (ENTRESTO) 49-51 MG Take 1 tablet by mouth 2 (two) times daily. 180 tablet 3  . spironolactone (ALDACTONE) 25 MG  tablet TAKE 1 TABLET (25 MG TOTAL) BY MOUTH DAILY. 90 tablet 3  . furosemide (LASIX) 40 MG tablet Take 1 tablet (40 mg total) by mouth daily. 30 tablet 11   No current facility-administered medications for this encounter.    Allergies:   Patient has no known allergies.   Social History:  The patient  reports that he has never smoked. He has never used smokeless tobacco. He reports that he does not drink alcohol and does not use drugs.   Family History:  The patient's family history includes Diabetes in his mother; Heart disease in his mother.   ROS:  Please see the history of present illness.   All other systems are personally reviewed and negative.   Exam:  BP 118/70   Pulse 66   Wt 127.5 kg (281 lb)   SpO2 96%   BMI 36.08 kg/m    Physical Exam  General: NAD Neck: JVP 8 cm, no thyromegaly or thyroid nodule.  Lungs: Mild crackles. CV: Nondisplaced PMI.  Heart regular S1/S2, no S3/S4, no murmur.  1+ edema 1/2 up lower legs bilaterally.  No carotid bruit.  Normal pedal pulses.  Abdomen:  Soft, nontender, no hepatosplenomegaly, no distention.  Skin: Intact without lesions or rashes.  Neurologic: Alert and oriented x 3.  Psych: Normal affect. Extremities: No clubbing or cyanosis.  HEENT: Normal.   Recent Labs: 12/23/2019: TSH 0.059 12/27/2019: Magnesium 1.9 01/01/2020: ALT 91 03/16/2020: Hemoglobin 12.4; Platelets 231 06/05/2020: B Natriuretic Peptide 274.3; BUN 21; Creatinine, Ser 1.11; Potassium 4.0; Sodium 140  Personally reviewed   Wt Readings from Last 3 Encounters:  06/05/20 127.5 kg (281 lb)  05/06/20 123.6 kg (272 lb 6.4 oz)  04/03/20 119.2 kg (262 lb 12.8 oz)    ASSESSMENT AND PLAN:  1. CAD: S/p CABG in 11/20. No chest pain.  - He has not been taking ASA, needs to restart at 81 mg daily.  - Continue atorvastatin, check lipids today.  2. Chronic systolic CHF: Ischemic cardiomyopathy. Pre-CABG echo with EF 30-35%, mild RV dysfunction. Post-CABG echo (11/20) with  EF 30-35%, severe RV systolic dysfunction. V/Q scan done, no evidence for PE.Hypotensive post-CABG requiring pressors and then midodrine, but improved over time. Echo repeated 3/21, EF 35-40% with moderate RV dysfunction, GIIDD.Cardiac MRIwas recommendedto quantify EF more exactly for purposes of ICD determination, however pt was adamant that he did not want an ICD and declinedcMRI. Echo repeated 11/21 LVEF improved to 45-50%, RV mildly reduced. No longer ICD candidate.  After COVID-19 infection in 11/21, patient developed severe orthostatic hypotension and was taken off all his cardiac meds.  He developed volume overload.  He is now off midodrine with resolution of orthostatic hypotension.  Echo in 3/22 was a technically difficult study, LV EF around 35-40% with moderate RV dysfunction. NYHA class II symptoms.  He is mildly volume overloaded by exam and weight is up.  - Increase Lasix to 40 mg daily.  BMET today and in 10 days.  - Continue  Farxiga 10 mg daily.  - Continue spironolactone 25 mg daily.    - Continue Entresto 24/26 bid.   - Start Coreg 3.125 mg bid.  - Echoes have been difficult, EF seems to be in the 35% range.  Cardiac MRI ordered to quantify EF more exactly, ?ICD candidate.  3. Carotid stenosis: Carotid dopplers (12/21) right ICA 40-59% stenosis, left ICA are consistent with a 1-39% stenosis.  - Repeat in 1 year (12/22).  - Continue ASA and statin.  4. Chronic thrombocytopenia: Referred to hematology for evaluation, higher at 231 K recently.   Followup in 3-4 wks with HF pharmacist for med titration, see me in 3 months.   Signed, [name], MD  06/07/2020    [place] [address] [facility] [place] [address] [phone] (office) [phone] (fax)

## 2020-06-15 ENCOUNTER — Other Ambulatory Visit: Payer: Self-pay

## 2020-06-15 ENCOUNTER — Ambulatory Visit (HOSPITAL_COMMUNITY)
Admission: RE | Admit: 2020-06-15 | Discharge: 2020-06-15 | Disposition: A | Discharge status: Home / Self Care | Payer: Self-pay | Source: Ambulatory Visit | Attending: Cardiology | Admitting: Cardiology

## 2020-06-15 DIAGNOSIS — I5022 Chronic systolic (congestive) heart failure: Secondary | ICD-10-CM | POA: Insufficient documentation

## 2020-06-15 LAB — BASIC METABOLIC PANEL
Anion gap: 8 (ref 5–15)
BUN: 27 mg/dL — ABNORMAL HIGH (ref 8–23)
CO2: 24 mmol/L (ref 22–32)
Calcium: 8.9 mg/dL (ref 8.9–10.3)
Chloride: 106 mmol/L (ref 98–111)
Creatinine, Ser: 1.24 mg/dL (ref 0.61–1.24)
GFR, Estimated: >60 mL/min (ref >60)
Glucose, Bld: 136 mg/dL — ABNORMAL HIGH (ref 70–99)
Potassium: 4 mmol/L (ref 3.5–5.1)
Sodium: 138 mmol/L (ref 135–145)

## 2020-06-25 ENCOUNTER — Other Ambulatory Visit: Payer: Self-pay

## 2020-07-02 ENCOUNTER — Other Ambulatory Visit: Payer: Self-pay

## 2020-07-02 MED FILL — Spironolactone Tab 25 MG: ORAL | 30 days supply | Qty: 30 | Fill #2 | Status: AC

## 2020-07-02 MED FILL — Atorvastatin Calcium Tab 40 MG (Base Equivalent): ORAL | 30 days supply | Qty: 30 | Fill #1 | Status: AC

## 2020-07-03 ENCOUNTER — Other Ambulatory Visit: Payer: Self-pay

## 2020-07-16 ENCOUNTER — Other Ambulatory Visit: Payer: Self-pay

## 2020-07-16 ENCOUNTER — Ambulatory Visit (HOSPITAL_COMMUNITY)
Admission: RE | Admit: 2020-07-16 | Discharge: 2020-07-16 | Disposition: A | Discharge status: Home / Self Care | Payer: Self-pay | Source: Ambulatory Visit | Attending: Internal Medicine | Admitting: Internal Medicine

## 2020-07-16 ENCOUNTER — Ambulatory Visit (HOSPITAL_COMMUNITY): Payer: Self-pay

## 2020-07-16 ENCOUNTER — Encounter (HOSPITAL_COMMUNITY): Payer: Self-pay

## 2020-07-16 VITALS — BP 132/82 | HR 62 | Wt 283.8 lb

## 2020-07-16 DIAGNOSIS — I5022 Chronic systolic (congestive) heart failure: Secondary | ICD-10-CM | POA: Insufficient documentation

## 2020-07-16 DIAGNOSIS — I6521 Occlusion and stenosis of right carotid artery: Secondary | ICD-10-CM | POA: Insufficient documentation

## 2020-07-16 DIAGNOSIS — D696 Thrombocytopenia, unspecified: Secondary | ICD-10-CM | POA: Insufficient documentation

## 2020-07-16 DIAGNOSIS — Z951 Presence of aortocoronary bypass graft: Secondary | ICD-10-CM | POA: Insufficient documentation

## 2020-07-16 DIAGNOSIS — I2510 Atherosclerotic heart disease of native coronary artery without angina pectoris: Secondary | ICD-10-CM | POA: Insufficient documentation

## 2020-07-16 DIAGNOSIS — Z8616 Personal history of COVID-19: Secondary | ICD-10-CM | POA: Insufficient documentation

## 2020-07-16 DIAGNOSIS — I255 Ischemic cardiomyopathy: Secondary | ICD-10-CM | POA: Insufficient documentation

## 2020-07-16 LAB — BASIC METABOLIC PANEL
Anion gap: 8 (ref 5–15)
BUN: 27 mg/dL — ABNORMAL HIGH (ref 8–23)
CO2: 25 mmol/L (ref 22–32)
Calcium: 9.1 mg/dL (ref 8.9–10.3)
Chloride: 107 mmol/L (ref 98–111)
Creatinine, Ser: 1.23 mg/dL (ref 0.61–1.24)
GFR, Estimated: >60 mL/min (ref >60)
Glucose, Bld: 121 mg/dL — ABNORMAL HIGH (ref 70–99)
Potassium: 3.7 mmol/L (ref 3.5–5.1)
Sodium: 140 mmol/L (ref 135–145)

## 2020-07-16 MED ORDER — ENTRESTO 97-103 MG PO TABS: 1.0000 | ORAL_TABLET | Freq: Two times a day (BID) | ORAL | 3 refills | Status: DC

## 2020-07-16 NOTE — Progress Notes (Signed)
PCP:  [name], NP   Cardiologist:  [name], MD HF Cardiology: Dr. Aundra Dubin   HPI:  Frank Love is a 65 y.o. male with a history of HTN, LE edema, and elevated BNP who returns for follow-up of CHF and CAD.    He initially was seen by PCP in 05/2018 with elevated BNP and was started on furosemide with cardiology referral. Furosemide was subsequently increased to 40 mg BID due to ongoing LE edema. He underwent cardiac evaluation for right hip surgery in September with continued symptoms of LE edema on furosemide 40 mg BID. Echo in 02/9526 showed systolic HF with EF 41-32%. He was started on Entresto and metoprolol tartrate at this time.    He underwent cath 11/2018 which showed multivessel disease, had CABGx4 12/12/18.  Repeat echo done post-CABG 12/17/18 showed severely decreased right ventricular systolic function with severe enlargement in addition to small pericardial effusion, otherwise similar to previous. VQ scan was negative for PE. Transferred to CIR for rehab.    He had right THR done in 04/4008 with no complications.    Echo repeated 03/2019, EF 35-40%, mild LVH, mild LV dilation, moderately decreased RV systolic function, normal RV size.    Admitted to [facility] 11/19-12/10/21 for COVID-19 viral pneumonia requiring up to 30 L of HFNC.  Received remdesivir x 5 days, steroid taper, tocilizumab/baricitinib. Hospitalization complicated by development of severe orthostatic hypotension and thrombocytopenia. Echo 12/14/19 with EF 45-50%. Heart Failure team consulted for on-going hypotension and bradycardia. Beta-blocker held, midodrine 15 mg TID started, and abdominal binder and TED hose applied. Patient discharged home off all GDMT medications due to hypotension and bradycardia.     Echo was done on 04/03/20 and reviewed: EF 35-40%, diffuse hypokinesis, moderately decreased RV systolic function with normal RV size.  Technically difficult study.   Presented on 5/13 for follow up with  Dr. Aundra Dubin. BP was 118/70. Weight up 19 lbs since March.  Not particularly symptomatic, no dyspnea walking up stairs or on flat ground.  No chest pain.  No orthopnea/PND.  No palpitations or lightheadedness.  Says he has been "eating too much."    Today he returns to HF clinic for pharmacist medication titration. At last visit, carvedilol 3.125 mg BID was initiated and furosemide was increased to 40 mg daily. Overall feeling great today. Denies dizziness, fatigue or chest pain. No shortness of breath. Weight stable over past month. He does not check his BP at home. Denies chest discomfort or orthopnea. Pitting LE edema on exam, this is chronic per patient. He has compression stockings, but rarely uses them. Appetite has been good and he is trying to cut back on salt.    HF Medications: Carvedilol 3.125 mg BID Entresto 49/51 mg BID Spironolactone 25 mg daily Farxiga 10 mg daily Furosemide 40 mg daily  Has the patient been experiencing any side effects to the medications prescribed? no   Does the patient have any problems obtaining medications due to transportation or finances?  No - he receives [place] from patient assistance program.   Understanding of regimen: good Understanding of indications: good Potential of compliance: good Patient understands to avoid NSAIDs. Patient understands to avoid decongestants.    Pertinent Lab Values: Serum creatinine 1.23 , BUN 27, Potassium 3.7, Sodium 140  Vital Signs: Weight: 283 lb (last clinic weight: 281 lbs) Blood pressure: 132/82 mmHg Heart rate:  62 bpm  Assessment/Plan: 1. CAD: S/p CABG in 11/2018. No chest pain.  - Continue  ASA 81 daily and atorvastatin.  2. Chronic systolic CHF: Ischemic cardiomyopathy.  Pre-CABG echo with EF 30-35%, mild RV dysfunction.  Post-CABG echo (11/2018) with EF 30-35%, severe RV systolic dysfunction.  V/Q scan done, no evidence for PE. Hypotensive post-CABG requiring pressors and then midodrine,  but improved over time.  Echo repeated 03/2019, EF 35-40% with moderate RV dysfunction, GIIDD. Cardiac MRI was recommended to quantify EF more exactly for purposes of ICD determination, however pt was adamant that he did not want an ICD and declined cMRI. Echo repeated 11/2019 LVEF improved to 45-50%, RV mildly reduced. No longer ICD candidate.  After COVID-19 infection in 11/2019, patient developed severe orthostatic hypotension and was taken off all his cardiac meds.  He developed volume overload.  He is now off midodrine with resolution of orthostatic hypotension.  Echo in 03/2020 was a technically difficult study, LVEF around 35-40% with moderate RV dysfunction.  - Volume status stable, NYHA class II.  - Increase Entresto 97-103 mg BID. BMET today. - Continue furosemide 40 mg daily with LE edema on exam. Recommended to use compression stockings. - Continue carvedilol 3.125 mg BID - Continue spironolactone 25 mg daily - Continue Farxiga 10 mg daily - Cardiac MRI has been ordered to quantify EF more exactly. ? ICD candidate  3. Carotid stenosis: Carotid dopplers (12/2019) right ICA 40-59% stenosis, left ICA are consistent with a 1-39% stenosis.  - Repeat in 1 year (12/2020).  - Continue ASA and statin.   4. Chronic thrombocytopenia: Referred to hematology for evaluation.  Follow up with APP in 1 month, then with Dr. Aundra Dubin in August   Hailey Thomas, PharmD PGY-1 [facility] Pharmacy Resident   Kerby Nora, PharmD, [facility] Pharmacist [phone]

## 2020-07-16 NOTE — Patient Instructions (Signed)
It was a pleasure seeing you today!  MEDICATIONS: -We are changing your medications today! -Increase Entresto to 97/103 mg twice daily  -Call if you have questions about your medications.  LABS: -We will call you if your labs need attention.  NEXT APPOINTMENT: Return to clinic in August 05, 2020 at 8:30 am with a NP/PA provider.  In general, to take care of your heart failure: -Limit your fluid intake to 2 Liters (half-gallon) per day.   -Limit your salt intake to ideally 2-3 grams (2000-3000 mg) per day. -Weigh yourself daily and record, and bring that "weight diary" to your next appointment.  (Weight gain of 2-3 pounds in 1 day typically means fluid weight.) -The medications for your heart are to help your heart and help you live longer.   -Please contact us before stopping any of your heart medications.  Call the clinic at [phone] with questions or to reschedule future appointments.

## 2020-07-31 ENCOUNTER — Other Ambulatory Visit: Payer: Self-pay

## 2020-07-31 ENCOUNTER — Ambulatory Visit: Payer: Self-pay | Attending: Nurse Practitioner

## 2020-08-03 ENCOUNTER — Other Ambulatory Visit: Payer: Self-pay

## 2020-08-03 MED FILL — Atorvastatin Calcium Tab 40 MG (Base Equivalent): ORAL | 30 days supply | Qty: 30 | Fill #2 | Status: AC

## 2020-08-03 MED FILL — Spironolactone Tab 25 MG: ORAL | 30 days supply | Qty: 30 | Fill #3 | Status: AC

## 2020-08-04 ENCOUNTER — Other Ambulatory Visit: Payer: Self-pay

## 2020-08-04 NOTE — Progress Notes (Signed)
[facility]  PCP:  [name], NP  Cardiologist:  [name], MD HF Cardiology: Dr Aundra Dubin    History of Present Illness: Frank Love is a 65 y.o. male with a history of HTN, LE edema, and elevated BNP who returns for followup of CHF and CAD.   He initially was seen by PCP in 5/20 with elevated BNP and was started on lasix with cardiology referral, lasix was subsequently increased to 40 mg bid due to ongoing LE edema. He underwent cardiac evaluation for right hip surgery in September with continued symptoms of LE edema on lasix 40 bid. Echo in 0/25 showed systolic HF with EF 85-27%. He was started on entresto, metoprolol at this time.   He underwent cath 11/20 which showed multivessel disease, had CABGx4 12/12/18.  Repeat echo done post-CABG 12/17/18 showed severely decreased right ventricular systolic function with severe enlargement in addition to small pericardial effusion, otherwise similar to previous. VQ scan was negative for PE. Transferred to CIR for rehab.   He had right THR done in 7/82 with no complications.   Echo repeated 03/2019, EF 35-40%, mild LVH, mild LV dilation, moderately decreased RV systolic function, normal RV size.   Admitted to [facility] 11/19-12/10/21 for COVID-19 viral pneumonia requiring up to 30 L of HFNC.  Received remdesivir x 5 days, steroid taper, tocilizumab/baricitinib. Hospitalization complicated by development of severe orthostatic hypotension and thrombocytopenia. Echo 12/14/19 with EF 45-50%. Heart Failure team consulted for on-going hypotension and bradycardia. Beta-blocker held, midodrine 15 mg tid started, and abdominal binder and TED hose applied. Patient discharged home off all GDMT medications due to hypotension and bradycardia.   Echo 3/22 showed EF 35-40%, diffuse hypokinesis, moderately decreased RV systolic function with normal RV size. Technically difficult study.    He was seen 5/22 for follow up, weight was up  19 lbs.  Lasix was increased and carvedilol started. He has been titrated to goal dose of Entresto. cMRI has been ordered.    Today he returns for HF follow up. Overall feeling fine. No breathing issues with walking, going up stairs or lifting. Denies increasing SOB, CP, dizziness, edema, or PND/Orthopnea. Appetite ok. No fever or chills. Weight at home 281 pounds. Taking all medications.   Labs (2/21): K 4.7, creatinine 1.07, LDL 61 Labs (3/21): K 4.3, creatinine 1.05, digoxin 0.9 Labs (5/21): K 4.3, creatinine 1.25  Labs (12/21): K 4.2 => 3.5, creatinine 1.17 => 0.91, plts 60K Labs (2/22): LDL 113, HDL 40 Labs (3/22): K 3.9, creatinine 1.12, BNP 272 Labs (5/22): LDL 66, HDL 44 Labs (6/22): K 3.7, creatinine 1.23  PMH: 1. OA: s/p right THR.  2. HTN 3. Carotid stenosis: Carotid dopplers (11/20) with 40-59% RICA stenosis.  - Carotid dopplers (12/21)  right ICA 40-59% stenosis, left ICA mild stenosis.  4. CAD: LHC in 11/20 with totally occluded OM, totally occluded mid LAD, totally occluded mid RCA.  - CABG 11/20 with LIMA-LAD, RIMA-PDA, SVG-OM1, SVG-AM 5. Chronic systolic CHF: Ischemic cardiomyopathy.  - Echo (9/20): EF 30-35%, mildly decreased RV systolic function.  - Echo (11/20): EF 30-35%, severely dilated RV with severely decreased systolic function. - Echo (3/21): EF 35-40%, mild LV dilation with mild LVH, moderately decreased RV systolic function with normal RV size, PASP 33 mmHg.   - Echo (11/21): LVEF improved to 45-50%. RV mildly reduced  - Echo (3/22): EF 35-40%, diffuse hypokinesis, moderately decreased RV systolic function with normal RV size.   6. V/Q  scan negative for PE in 11/20.  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  8. COVID-19 11/21  Past Surgical History:  Procedure Laterality Date   CIRCUMCISION N/A 10/12/2018   Procedure: CIRCUMCISION ADULT;  Surgeon: Franchot Gallo, MD;  Location: [facility];  Service: Urology;  Laterality: N/A;  45 mins   CO2 LASER APPLICATION  N/A 05/27/2990   Procedure: CO2 LASER APPLICATION;  Surgeon: Franchot Gallo, MD;  Location: [facility];  Service: Urology;  Laterality: N/A;  30 MINS   COLONOSCOPY     CORONARY ARTERY BYPASS GRAFT N/A 12/12/2018   Procedure: CORONARY ARTERY BYPASS GRAFTING (CABG) x four, using bilateral internal mammary arteries and right leg greater saphenous vein harvested endoscopically;  Surgeon: [name], MD;  Location: [place];  Service: Open Heart Surgery;  Laterality: N/A;   LEFT HEART CATH AND CORONARY ANGIOGRAPHY N/A 11/28/2018   Procedure: LEFT HEART CATH AND CORONARY ANGIOGRAPHY;  Surgeon: Burnell Blanks, MD;  Location: [place] [facility];  Service: Cardiovascular;  Laterality: N/A;   MEDIASTINAL EXPLORATION N/A 12/12/2018   Procedure: Mediastinal Re-Exploration after bypass graft;  Surgeon: [name], MD;  Location: [place];  Service: Open Heart Surgery;  Laterality: N/A;   TEE WITHOUT CARDIOVERSION N/A 12/12/2018   Procedure: TRANSESOPHAGEAL ECHOCARDIOGRAM (TEE);  Surgeon: [name], MD;  Location: [place];  Service: Open Heart Surgery;  Laterality: N/A;   TOOTH EXTRACTION     TOTAL HIP ARTHROPLASTY Right 03/08/2019   Procedure: RIGHT TOTAL HIP ARTHROPLASTY ANTERIOR APPROACH;  Surgeon: Mcarthur Rossetti, MD;  [facility];  Service: Orthopedics;  Laterality: Right;    Current Outpatient Medications  Medication Sig Dispense Refill   aspirin EC 81 MG tablet Take 1 tablet (81 mg total) by mouth daily. Swallow whole. 90 tablet 3   atorvastatin (LIPITOR) 40 MG tablet Take 1 tablet (40 mg total) by mouth daily at 6 PM. 30 tablet 11   carvedilol (COREG) 3.125 MG tablet Take 1 tablet (3.125 mg total) by mouth 2 (two) times daily. 60 tablet 3   dapagliflozin propanediol (FARXIGA) 10 MG TABS tablet TAKE 1 TABLET (10 MG TOTAL) BY MOUTH DAILY BEFORE BREAKFAST. 30 tablet 11   furosemide (LASIX) 40 MG tablet Take 1 tablet (40 mg total) by mouth daily. 30 tablet 11    nitroGLYCERIN (NITROSTAT) 0.4 MG SL tablet Place 1 tablet (0.4 mg total) under the tongue every 5 (five) minutes as needed for chest pain. NO more than 3 pills--if you have the need to take 2 pills or more call MD 30 tablet 3   sacubitril-valsartan (ENTRESTO) 97-103 MG Take 1 tablet by mouth 2 (two) times daily. 180 tablet 3   spironolactone (ALDACTONE) 25 MG tablet TAKE 1 TABLET (25 MG TOTAL) BY MOUTH DAILY. 90 tablet 3   No current facility-administered medications for this encounter.   Allergies:   Patient has no known allergies.   Social History:  The patient  reports that he has never smoked. He has never used smokeless tobacco. He reports that he does not drink alcohol and does not use drugs.   Family History:  The patient's family history includes Diabetes in his mother; Heart disease in his mother.   ROS:  Please see the history of present illness.   All other systems are personally reviewed and negative.   Exam:  BP 129/78   Pulse 60   Wt 128.4 kg (283 lb)   SpO2 95%   BMI 36.34 kg/m    Physical Exam  General:  NAD. No resp difficulty HEENT: Normal Neck: Supple. No JVD. Carotids 2+ bilat; no bruits. No lymphadenopathy or thryomegaly appreciated. Cor: PMI nondisplaced. Regular rate & rhythm. No rubs, gallops or murmurs. Lungs: Clear Abdomen: Obese, nontender, nondistended. No hepatosplenomegaly. No bruits or masses. Good bowel sounds. Extremities: No cyanosis, clubbing, rash, trace ankle edema L>R Neuro: Alert & oriented x 3, cranial nerves grossly intact. Moves all 4 extremities w/o difficulty. Affect pleasant.  Recent Labs: 12/23/2019: TSH 0.059 12/27/2019: Magnesium 1.9 01/01/2020: ALT 91 03/16/2020: Hemoglobin 12.4; Platelets 231 06/05/2020: B Natriuretic Peptide 274.3 07/16/2020: BUN 27; Creatinine, Ser 1.23; Potassium 3.7; Sodium 140  Personally reviewed   Wt Readings from Last 3 Encounters:  08/05/20 128.4 kg (283 lb)  07/16/20 128.7 kg (283 lb 12.8 oz)  06/05/20  127.5 kg (281 lb)    ASSESSMENT AND PLAN:  1. CAD: S/p CABG in 11/20. No chest pain.  - He has not been taking ASA, needs to restart at 81 mg daily.  - Continue atorvastatin, lipids (5/22) ok. 2. Chronic systolic CHF: Ischemic cardiomyopathy.  Pre-CABG echo with EF 30-35%, mild RV dysfunction.  Post-CABG echo (11/20) with EF 30-35%, severe RV systolic dysfunction.  V/Q scan done, no evidence for PE. Hypotensive post-CABG requiring pressors and then midodrine, but improved over time.  Echo repeated 3/21, EF 35-40% with moderate RV dysfunction, GIIDD. Cardiac MRI was recommended to quantify EF more exactly for purposes of ICD determination, however pt was adamant that he did not want an ICD and declined cMRI. Echo repeated 11/21 LVEF improved to 45-50%, RV mildly reduced. No longer ICD candidate.  After COVID-19 infection in 11/21, patient developed severe orthostatic hypotension and was taken off all his cardiac meds.  He developed volume overload.  He is now off midodrine with resolution of orthostatic hypotension.  Echo in 3/22 was a technically difficult study, LV EF around 35-40% with moderate RV dysfunction. NYHA class I-II symptoms.  He is not volume overloaded by exam. - Continue Lasix to 40 mg daily.  BMET today. - Continue Farxiga 10 mg daily. Counseled on worrisome GU symptoms to watch for. - Continue spironolactone 25 mg daily.    - Continue Entresto 97/103 bid.   - Continue Coreg 3.125 mg bid.  - Echoes have been difficult, EF seems to be in the 35% range.  Cardiac MRI ordered to quantify EF more exactly, ?ICD candidate. In the past he has been adamant he does not want an ICD. We discussed today the rationale for needing cMRI to better quantify EF (and subsequent need for ICD), he is agreeable. MRI ordered, will reach out to scheduler to contact patient. 3. Carotid stenosis: Carotid dopplers (12/21) right ICA 40-59% stenosis, left ICA are consistent with a 1-39% stenosis.  - Repeat in 1  year (12/22).  - Continue ASA and statin. Will check lipids today. 4. Chronic thrombocytopenia: Referred to hematology for evaluation, higher at 231 K recently.   Followup with Dr. Aundra Dubin as scheduled next month.  Signed, Rafael Bihari, FNP  08/05/2020   [place] [address] Heart and [place] [address] [phone] (office) [phone] (fax)

## 2020-08-05 ENCOUNTER — Ambulatory Visit (HOSPITAL_COMMUNITY)
Admission: RE | Admit: 2020-08-05 | Discharge: 2020-08-05 | Disposition: A | Discharge status: Home / Self Care | Payer: Self-pay | Source: Ambulatory Visit | Attending: Family Medicine | Admitting: Family Medicine

## 2020-08-05 ENCOUNTER — Encounter (HOSPITAL_COMMUNITY): Payer: Self-pay

## 2020-08-05 ENCOUNTER — Other Ambulatory Visit: Payer: Self-pay

## 2020-08-05 VITALS — BP 129/78 | HR 60 | Wt 283.0 lb

## 2020-08-05 DIAGNOSIS — Z7984 Long term (current) use of oral hypoglycemic drugs: Secondary | ICD-10-CM | POA: Insufficient documentation

## 2020-08-05 DIAGNOSIS — I951 Orthostatic hypotension: Secondary | ICD-10-CM | POA: Insufficient documentation

## 2020-08-05 DIAGNOSIS — Z7982 Long term (current) use of aspirin: Secondary | ICD-10-CM | POA: Insufficient documentation

## 2020-08-05 DIAGNOSIS — Z8249 Family history of ischemic heart disease and other diseases of the circulatory system: Secondary | ICD-10-CM | POA: Insufficient documentation

## 2020-08-05 DIAGNOSIS — Z8616 Personal history of COVID-19: Secondary | ICD-10-CM | POA: Insufficient documentation

## 2020-08-05 DIAGNOSIS — D693 Immune thrombocytopenic purpura: Secondary | ICD-10-CM

## 2020-08-05 DIAGNOSIS — I110 Hypertensive heart disease with heart failure: Secondary | ICD-10-CM | POA: Insufficient documentation

## 2020-08-05 DIAGNOSIS — D696 Thrombocytopenia, unspecified: Secondary | ICD-10-CM | POA: Insufficient documentation

## 2020-08-05 DIAGNOSIS — I6529 Occlusion and stenosis of unspecified carotid artery: Secondary | ICD-10-CM | POA: Insufficient documentation

## 2020-08-05 DIAGNOSIS — Z79899 Other long term (current) drug therapy: Secondary | ICD-10-CM | POA: Insufficient documentation

## 2020-08-05 DIAGNOSIS — I255 Ischemic cardiomyopathy: Secondary | ICD-10-CM | POA: Insufficient documentation

## 2020-08-05 DIAGNOSIS — Z951 Presence of aortocoronary bypass graft: Secondary | ICD-10-CM | POA: Insufficient documentation

## 2020-08-05 DIAGNOSIS — I5022 Chronic systolic (congestive) heart failure: Secondary | ICD-10-CM | POA: Insufficient documentation

## 2020-08-05 DIAGNOSIS — I2510 Atherosclerotic heart disease of native coronary artery without angina pectoris: Secondary | ICD-10-CM | POA: Insufficient documentation

## 2020-08-05 DIAGNOSIS — I6523 Occlusion and stenosis of bilateral carotid arteries: Secondary | ICD-10-CM

## 2020-08-05 LAB — LIPID PANEL
Cholesterol: 115 mg/dL (ref 0–200)
HDL: 41 mg/dL (ref >40)
LDL Cholesterol: 59 mg/dL (ref 0–99)
Total CHOL/HDL Ratio: 2.8 RATIO
Triglycerides: 73 mg/dL (ref <150)
VLDL: 15 mg/dL (ref 0–40)

## 2020-08-05 LAB — BASIC METABOLIC PANEL
Anion gap: 10 (ref 5–15)
BUN: 26 mg/dL — ABNORMAL HIGH (ref 8–23)
CO2: 22 mmol/L (ref 22–32)
Calcium: 9 mg/dL (ref 8.9–10.3)
Chloride: 108 mmol/L (ref 98–111)
Creatinine, Ser: 1.23 mg/dL (ref 0.61–1.24)
GFR, Estimated: >60 mL/min (ref >60)
Glucose, Bld: 117 mg/dL — ABNORMAL HIGH (ref 70–99)
Potassium: 3.7 mmol/L (ref 3.5–5.1)
Sodium: 140 mmol/L (ref 135–145)

## 2020-08-05 NOTE — Patient Instructions (Addendum)
Your provider collected labs today. We will call you with any abnormal results.  Please keep your follow up appointment with Dr. Aundra Dubin as scheduled on 09/07/20 at 9:40 am.  Limit your fluid intake to less than 2 liters of fluid per day. Fluid includes all drinks, coffee, juice, ice chips, soup, jello, and all other liquids.   Restrict your sodium intake to less than 2000mg  per day. This will help prevent your body from holding onto fluid. Read food labels as a lot of canned and packaged foods have a lot of sodium.   Weigh yourself EVERY morning after you go to the bathroom but before you eat or drink anything. Write this number down in a weight log/diary. If you gain 3 pounds overnight or 5 pounds in a week, call the heart failure clinic  Have a great day!

## 2020-08-18 ENCOUNTER — Ambulatory Visit: Payer: Self-pay | Attending: Nurse Practitioner

## 2020-08-18 ENCOUNTER — Other Ambulatory Visit: Payer: Self-pay

## 2020-09-01 ENCOUNTER — Other Ambulatory Visit: Payer: Self-pay

## 2020-09-01 MED FILL — Atorvastatin Calcium Tab 40 MG (Base Equivalent): ORAL | 30 days supply | Qty: 30 | Fill #3 | Status: AC

## 2020-09-01 MED FILL — Spironolactone Tab 25 MG: ORAL | 30 days supply | Qty: 30 | Fill #4 | Status: AC

## 2020-09-03 ENCOUNTER — Other Ambulatory Visit: Payer: Self-pay

## 2020-09-07 ENCOUNTER — Encounter (HOSPITAL_COMMUNITY): Payer: Self-pay | Admitting: Cardiology

## 2020-09-07 ENCOUNTER — Other Ambulatory Visit: Payer: Self-pay

## 2020-09-07 ENCOUNTER — Ambulatory Visit (HOSPITAL_COMMUNITY)
Admission: RE | Admit: 2020-09-07 | Discharge: 2020-09-07 | Disposition: A | Discharge status: Home / Self Care | Payer: Self-pay | Source: Ambulatory Visit | Attending: Cardiology | Admitting: Cardiology

## 2020-09-07 VITALS — BP 102/60 | HR 72 | Wt 286.0 lb

## 2020-09-07 DIAGNOSIS — Z7982 Long term (current) use of aspirin: Secondary | ICD-10-CM | POA: Insufficient documentation

## 2020-09-07 DIAGNOSIS — I5022 Chronic systolic (congestive) heart failure: Secondary | ICD-10-CM | POA: Insufficient documentation

## 2020-09-07 DIAGNOSIS — Z7901 Long term (current) use of anticoagulants: Secondary | ICD-10-CM | POA: Insufficient documentation

## 2020-09-07 DIAGNOSIS — R0602 Shortness of breath: Secondary | ICD-10-CM | POA: Insufficient documentation

## 2020-09-07 DIAGNOSIS — I110 Hypertensive heart disease with heart failure: Secondary | ICD-10-CM | POA: Insufficient documentation

## 2020-09-07 DIAGNOSIS — Z7984 Long term (current) use of oral hypoglycemic drugs: Secondary | ICD-10-CM | POA: Insufficient documentation

## 2020-09-07 DIAGNOSIS — Z8249 Family history of ischemic heart disease and other diseases of the circulatory system: Secondary | ICD-10-CM | POA: Insufficient documentation

## 2020-09-07 DIAGNOSIS — Z79899 Other long term (current) drug therapy: Secondary | ICD-10-CM | POA: Insufficient documentation

## 2020-09-07 DIAGNOSIS — Z8616 Personal history of COVID-19: Secondary | ICD-10-CM | POA: Insufficient documentation

## 2020-09-07 DIAGNOSIS — I252 Old myocardial infarction: Secondary | ICD-10-CM | POA: Insufficient documentation

## 2020-09-07 DIAGNOSIS — I2510 Atherosclerotic heart disease of native coronary artery without angina pectoris: Secondary | ICD-10-CM | POA: Insufficient documentation

## 2020-09-07 DIAGNOSIS — Z951 Presence of aortocoronary bypass graft: Secondary | ICD-10-CM | POA: Insufficient documentation

## 2020-09-07 DIAGNOSIS — I313 Pericardial effusion (noninflammatory): Secondary | ICD-10-CM | POA: Insufficient documentation

## 2020-09-07 DIAGNOSIS — I255 Ischemic cardiomyopathy: Secondary | ICD-10-CM | POA: Insufficient documentation

## 2020-09-07 DIAGNOSIS — Z09 Encounter for follow-up examination after completed treatment for conditions other than malignant neoplasm: Secondary | ICD-10-CM | POA: Insufficient documentation

## 2020-09-07 LAB — BASIC METABOLIC PANEL
Anion gap: 9 (ref 5–15)
BUN: 30 mg/dL — ABNORMAL HIGH (ref 8–23)
CO2: 23 mmol/L (ref 22–32)
Calcium: 8.4 mg/dL — ABNORMAL LOW (ref 8.9–10.3)
Chloride: 107 mmol/L (ref 98–111)
Creatinine, Ser: 1.19 mg/dL (ref 0.61–1.24)
GFR, Estimated: >60 mL/min (ref >60)
Glucose, Bld: 146 mg/dL — ABNORMAL HIGH (ref 70–99)
Potassium: 3.9 mmol/L (ref 3.5–5.1)
Sodium: 139 mmol/L (ref 135–145)

## 2020-09-07 LAB — BRAIN NATRIURETIC PEPTIDE: B Natriuretic Peptide: 214.5 pg/mL — ABNORMAL HIGH (ref 0.0–100.0)

## 2020-09-07 MED ORDER — CARVEDILOL 6.25 MG PO TABS
6.2500 mg | ORAL_TABLET | Freq: Two times a day (BID) | ORAL | 3 refills | Status: DC
  Filled 2020-09-07: qty 60, 30d supply, fill #0
  Filled 2020-10-01: qty 60, 30d supply, fill #1
  Filled 2020-10-29: qty 60, 30d supply, fill #2
  Filled 2020-12-01: qty 60, 30d supply, fill #3
  Filled 2021-01-18: qty 60, 30d supply, fill #4
  Filled 2021-02-16: qty 60, 30d supply, fill #0
  Filled 2021-02-16: qty 60, 30d supply, fill #5
  Filled 2021-03-16: qty 60, 30d supply, fill #1

## 2020-09-07 NOTE — Progress Notes (Signed)
PCP:  [name], NP  Cardiologist:  [name], MD HF Cardiology: Dr Aundra Dubin    History of Present Illness: Frank Love is a 65 y.o. male with a history of HTN, LE edema, and elevated BNP who returns for followup of CHF and CAD.   He initially was seen by PCP in 5/20 with elevated BNP and was started on lasix with cardiology referral, lasix was subsequently increased to 40 mg bid due to ongoing LE edema. He underwent cardiac evaluation for right hip surgery in September with continued symptoms of LE edema on lasix 40 bid. Echo in 123456 showed systolic HF with EF 99991111. He was started on entresto, metoprolol at this time.   He underwent cath 11/20 which showed multivessel disease, had CABGx4 12/12/18.  Repeat echo done post-CABG 12/17/18 showed severely decreased right ventricular systolic function with severe enlargement in addition to small pericardial effusion, otherwise similar to previous. VQ scan was negative for PE. Transferred to CIR for rehab.   He had right THR done in [id number] with no complications.   Echo repeated 03/2019, EF 35-40%, mild LVH, mild LV dilation, moderately decreased RV systolic function, normal RV size.   Admitted to [facility] 11/19-12/10/21 for COVID-19 viral pneumonia requiring up to 30 L of HFNC.  Received remdesivir x 5 days, steroid taper, tocilizumab/baricitinib. Hospitalization complicated by development of severe orthostatic hypotension and thrombocytopenia. Echo 12/14/19 with EF 45-50%. Heart Failure team consulted for on-going hypotension and bradycardia. Beta-blocker held, midodrine 15 mg tid started, and abdominal binder and TED hose applied. Patient discharged home off all GDMT medications due to hypotension and bradycardia.   Echo in 3/22 showed EF 35-40%, diffuse hypokinesis, moderately decreased RV systolic function with normal RV size.  Technically difficult study.    He returns for followup of CHF.  Weight is up about 3 lbs.  No chest pain.  He is short of breath after walking about 2 blocks.  He is ok walking up 1 flight of stairs.  No orthopnea/PND.  No lightheadedness.  [place] L>R lower leg swelling with prior negative venous dopplers for DVT.   ECG (personally reviewed): NSR, LAFB, old inferior MI, old anterior MI.     Labs (2/21): K 4.7, creatinine 1.07, LDL 61 Labs (3/21): K 4.3, creatinine 1.05, digoxin 0.9 Labs (5/21): K 4.3, creatinine 1.25  Labs (12/21): K 4.2 => 3.5, creatinine 1.17 => 0.91, plts 60K Labs (2/22): LDL 113, HDL 40 Labs (3/22): K 3.9, creatinine 1.12, BNP 272 Labs (7/22): LDL 59, HDL 41, K 3.7, creatinine 1.23  PMH: 1. OA: s/p right THR.  2. HTN 3. Carotid stenosis: Carotid dopplers (11/20) with 40-59% RICA stenosis.  - Carotid dopplers (12/21)  right ICA 40-59% stenosis, left ICA mild stenosis.  4. CAD: LHC in 11/20 with totally occluded OM, totally occluded mid LAD, totally occluded mid RCA.  - CABG 11/20 with LIMA-LAD, RIMA-PDA, SVG-OM1, SVG-AM 5. Chronic systolic CHF: Ischemic cardiomyopathy.  - Echo (9/20): EF 30-35%, mildly decreased RV systolic function.  - Echo (11/20): EF 30-35%, severely dilated RV with severely decreased systolic function. - Echo (3/21): EF 35-40%, mild LV dilation with mild LVH, moderately decreased RV systolic function with normal RV size, PASP 33 mmHg.   - Echo (11/21): LVEF improved to 45-50%. RV mildly reduced  - Echo (3/22): EF 35-40%, diffuse hypokinesis, moderately decreased RV systolic function with normal RV size.   6. V/Q scan negative for PE in 11/20.  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  8. COVID-19 11/21  Past Surgical History:  Procedure Laterality Date   CIRCUMCISION N/A 10/12/2018   Procedure: CIRCUMCISION ADULT;  Surgeon: Franchot Gallo, MD;  Location: [facility];  Service: Urology;  Laterality: N/A;  45 mins   CO2 LASER APPLICATION N/A 123456   Procedure: CO2 LASER APPLICATION;  Surgeon: Franchot Gallo, MD;  [facility]: [facility];  Service: Urology;   Laterality: N/A;  30 MINS   COLONOSCOPY     CORONARY ARTERY BYPASS GRAFT N/A 12/12/2018   Procedure: CORONARY ARTERY BYPASS GRAFTING (CABG) x four, using bilateral internal mammary arteries and right leg greater saphenous vein harvested endoscopically;  Surgeon: [name], MD;  Location: [place];  Service: Open Heart Surgery;  Laterality: N/A;   LEFT HEART CATH AND CORONARY ANGIOGRAPHY N/A 11/28/2018   Procedure: LEFT HEART CATH AND CORONARY ANGIOGRAPHY;  Surgeon: Burnell Blanks, MD;  Location: [place] [facility];  Service: Cardiovascular;  Laterality: N/A;   MEDIASTINAL EXPLORATION N/A 12/12/2018   Procedure: Mediastinal Re-Exploration after bypass graft;  Surgeon: [name], MD;  Location: [place];  Service: Open Heart Surgery;  Laterality: N/A;   TEE WITHOUT CARDIOVERSION N/A 12/12/2018   Procedure: TRANSESOPHAGEAL ECHOCARDIOGRAM (TEE);  Surgeon: [name], MD;  Location: [place];  Service: Open Heart Surgery;  Laterality: N/A;   TOOTH EXTRACTION     TOTAL HIP ARTHROPLASTY Right 03/08/2019   Procedure: RIGHT TOTAL HIP ARTHROPLASTY ANTERIOR APPROACH;  Surgeon: Mcarthur Rossetti, MD;  [facility];  Service: Orthopedics;  Laterality: Right;    Current Outpatient Medications  Medication Sig Dispense Refill   aspirin EC 81 MG tablet Take 1 tablet (81 mg total) by mouth daily. Swallow whole. 90 tablet 3   atorvastatin (LIPITOR) 40 MG tablet Take 1 tablet (40 mg total) by mouth daily at 6 PM. 30 tablet 11   dapagliflozin propanediol (FARXIGA) 10 MG TABS tablet TAKE 1 TABLET (10 MG TOTAL) BY MOUTH DAILY BEFORE BREAKFAST. 30 tablet 11   furosemide (LASIX) 40 MG tablet Take 1 tablet (40 mg total) by mouth daily. 30 tablet 11   nitroGLYCERIN (NITROSTAT) 0.4 MG SL tablet Place 1 tablet (0.4 mg total) under the tongue every 5 (five) minutes as needed for chest pain. NO more than 3 pills--if you have the need to take 2 pills or more call MD 30 tablet 3    sacubitril-valsartan (ENTRESTO) 97-103 MG Take 1 tablet by mouth 2 (two) times daily. 180 tablet 3   spironolactone (ALDACTONE) 25 MG tablet TAKE 1 TABLET (25 MG TOTAL) BY MOUTH DAILY. 90 tablet 3   carvedilol (COREG) 6.25 MG tablet Take 1 tablet (6.25 mg total) by mouth 2 (two) times daily. 180 tablet 3   No current facility-administered medications for this encounter.    Allergies:   Patient has no known allergies.   Social History:  The patient  reports that he has never smoked. He has never used smokeless tobacco. He reports that he does not drink alcohol and does not use drugs.   Family History:  The patient's family history includes Diabetes in his mother; Heart disease in his mother.   ROS:  Please see the history of present illness.   All other systems are personally reviewed and negative.   Exam:  BP 102/60   Pulse 72   Wt 129.7 kg (286 lb)   SpO2 97%   BMI 36.72 kg/m    Physical Exam  General: NAD Neck: No JVD, no thyromegaly or thyroid nodule.  Lungs: Clear to auscultation bilaterally with normal respiratory effort. CV: Nondisplaced PMI.  Heart regular S1/S2, no S3/S4, no murmur.  1+ ankle edema L>R.  No carotid bruit.  Normal pedal pulses.  Abdomen: Soft, nontender, no hepatosplenomegaly, no distention.  Skin: Intact without lesions or rashes.  Neurologic: Alert and oriented x 3.  Psych: Normal affect. Extremities: No clubbing or cyanosis.  HEENT: Normal.   Recent Labs: 12/23/2019: TSH 0.059 12/27/2019: Magnesium 1.9 01/01/2020: ALT 91 03/16/2020: Hemoglobin 12.4; Platelets 231 09/07/2020: B Natriuretic Peptide 214.5; BUN 30; Creatinine, Ser 1.19; Potassium 3.9; Sodium 139  Personally reviewed   Wt Readings from Last 3 Encounters:  09/07/20 129.7 kg (286 lb)  08/05/20 128.4 kg (283 lb)  07/16/20 128.7 kg (283 lb 12.8 oz)    ASSESSMENT AND PLAN:  1. CAD: S/p CABG in 11/20. No chest pain.  - Continue ASA 81 mg daily.  - Continue atorvastatin, good lipids in  7/22.  2. Chronic systolic CHF: Ischemic cardiomyopathy.  Pre-CABG echo with EF 30-35%, mild RV dysfunction.  Post-CABG echo (11/20) with EF 30-35%, severe RV systolic dysfunction.  V/Q scan done, no evidence for PE. Hypotensive post-CABG requiring pressors and then midodrine, but improved over time.  Echo repeated 3/21, EF 35-40% with moderate RV dysfunction, GIIDD. Cardiac MRI was recommended to quantify EF more exactly for purposes of ICD determination, however pt was adamant that he did not want an ICD and declined cMRI. Echo repeated 11/21 LVEF improved to 45-50%, RV mildly reduced. Echo in 3/22 was a technically difficult study, LV EF around 35-40% with moderate RV dysfunction. NYHA class II symptoms.  He is not volume overloaded on exam. .  - Continue Lasix 40 mg daily.  BMET today.  - Continue  Farxiga 10 mg daily.  - Continue spironolactone 25 mg daily.    - Continue Entresto 97/103 bid.   - Increase Coreg to 6.25 mg bid.  - Echoes have been difficult, EF seems to be in the 35% range.  Cardiac MRI ordered to quantify EF more exactly, but patient is not able to get MRI due to lack of insurance coverage.   He will need eventual repeat echo to reassess for ICD placement, last echo appeared to be just out of range.  3. Carotid stenosis: Carotid dopplers (12/21) right ICA 40-59% stenosis, left ICA are consistent with a 1-39% stenosis.  - Repeat in 1 year (12/22).  - Continue ASA and statin.   Followup in 3-4 wks with HF pharmacist for med titration, followup with APP in 3 months.   Signed, [name], MD  09/07/2020    [place] [address] Heart and Vascular [place] [address] [phone] (office) [phone] (fax)

## 2020-09-07 NOTE — Patient Instructions (Addendum)
EKG done today.  Labs done today. We will contact you only if your labs are abnormal.  INCREASE Carvedilol to 6.'25mg'$  (1 tablet) by mouth 2 times daily .  No other medication changes were made. Please continue all current medications as prescribed.  Your physician recommends that you schedule a follow-up appointment in: 3-4 weeks with our Clinic Pharmacist and in 3 months with our APP Clinic here in office.   If you have any questions or concerns before your next appointment please send [place] a message through [place] or call our office at [phone].    TO LEAVE A MESSAGE FOR THE NURSE SELECT OPTION 2, PLEASE LEAVE A MESSAGE INCLUDING: YOUR NAME DATE OF BIRTH CALL BACK NUMBER REASON FOR CALL**this is important as we prioritize the call backs  YOU WILL RECEIVE A CALL BACK THE SAME DAY AS LONG AS YOU CALL BEFORE 4:00 PM   Do the following things EVERYDAY: Weigh yourself in the morning before breakfast. Write it down and keep it in a log. Take your medicines as prescribed Eat low salt foods--Limit salt (sodium) to 2000 mg per day.  Stay as active as you can everyday Limit all fluids for the day to less than 2 liters   At the [facility], you and your health needs are our priority. As part of our continuing mission to provide you with exceptional heart care, we have created designated Provider Care Teams. These Care Teams include your primary Cardiologist (physician) and Advanced Practice Providers (APPs- Physician Assistants and Nurse Practitioners) who all work together to provide you with the care you need, when you need it.   You may see any of the following providers on your designated Care Team at your next follow up: Dr Glori Bickers Dr Haynes Kerns, NP Lyda Jester, [place] Audry Riles, PharmD   Please be sure to bring in all your medications bottles to every appointment.

## 2020-09-08 ENCOUNTER — Other Ambulatory Visit: Payer: Self-pay

## 2020-09-16 ENCOUNTER — Ambulatory Visit: Payer: Self-pay | Attending: Nurse Practitioner | Admitting: Nurse Practitioner

## 2020-09-16 ENCOUNTER — Other Ambulatory Visit: Payer: Self-pay

## 2020-09-16 ENCOUNTER — Encounter: Payer: Self-pay | Admitting: Nurse Practitioner

## 2020-09-16 VITALS — BP 103/65 | HR 71 | Resp 16 | Wt 286.0 lb

## 2020-09-16 DIAGNOSIS — I110 Hypertensive heart disease with heart failure: Secondary | ICD-10-CM | POA: Insufficient documentation

## 2020-09-16 DIAGNOSIS — I509 Heart failure, unspecified: Secondary | ICD-10-CM | POA: Insufficient documentation

## 2020-09-16 DIAGNOSIS — Z7982 Long term (current) use of aspirin: Secondary | ICD-10-CM | POA: Insufficient documentation

## 2020-09-16 DIAGNOSIS — Z7984 Long term (current) use of oral hypoglycemic drugs: Secondary | ICD-10-CM | POA: Insufficient documentation

## 2020-09-16 DIAGNOSIS — I2510 Atherosclerotic heart disease of native coronary artery without angina pectoris: Secondary | ICD-10-CM | POA: Insufficient documentation

## 2020-09-16 DIAGNOSIS — R7303 Prediabetes: Secondary | ICD-10-CM | POA: Insufficient documentation

## 2020-09-16 DIAGNOSIS — Z8249 Family history of ischemic heart disease and other diseases of the circulatory system: Secondary | ICD-10-CM | POA: Insufficient documentation

## 2020-09-16 DIAGNOSIS — Z8616 Personal history of COVID-19: Secondary | ICD-10-CM | POA: Insufficient documentation

## 2020-09-16 DIAGNOSIS — Z951 Presence of aortocoronary bypass graft: Secondary | ICD-10-CM | POA: Insufficient documentation

## 2020-09-16 DIAGNOSIS — Z833 Family history of diabetes mellitus: Secondary | ICD-10-CM | POA: Insufficient documentation

## 2020-09-16 DIAGNOSIS — D696 Thrombocytopenia, unspecified: Secondary | ICD-10-CM | POA: Insufficient documentation

## 2020-09-16 DIAGNOSIS — I10 Essential (primary) hypertension: Secondary | ICD-10-CM

## 2020-09-16 DIAGNOSIS — Z79899 Other long term (current) drug therapy: Secondary | ICD-10-CM | POA: Insufficient documentation

## 2020-09-16 LAB — POCT GLYCOSYLATED HEMOGLOBIN (HGB A1C): HbA1c, POC (controlled diabetic range): 5.8 % (ref 0.0–7.0)

## 2020-09-16 NOTE — Progress Notes (Signed)
Assessment & Plan:  Frank Love was seen today for hypertension.  Diagnoses and all orders for this visit:  Primary hypertension Continue all antihypertensives as prescribed.  Remember to bring in your blood pressure log with you for your follow up appointment.  DASH/Mediterranean Diets are healthier choices for HTN.     Prediabetes -     HgB A1c   Patient has been counseled on age-appropriate routine health concerns for screening and prevention. These are reviewed and up-to-date. Referrals have been placed accordingly. Immunizations are up-to-date or declined.    Subjective:   Chief Complaint  Patient presents with   Hypertension     HPI Frank Love 65 y.o. 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   HTN Blood pressure is well controlled. He is currently taking carvedilol 6.25 mg BID ,furosemide 40 mg daily, entresto 97-103 mg BID, spirinolactone 25 mg daily. Denies chest pain, shortness of breath, palpitations, lightheadedness, dizziness, headaches or BLE edema.   BP Readings from Last 3 Encounters:  09/16/20 103/65  09/07/20 102/60  08/05/20 129/78    PREDIABETES Well controlled with diet at this time.  Lab Results  Component Value Date   HGBA1C 5.8 09/16/2020     Review of Systems  Constitutional:  Negative for fever, malaise/fatigue and weight loss.  HENT: Negative.  Negative for nosebleeds.   Eyes: Negative.  Negative for blurred vision, double vision and photophobia.  Respiratory: Negative.  Negative for cough and shortness of breath.   Cardiovascular: Negative.  Negative for chest pain, palpitations and leg swelling.  Gastrointestinal: Negative.  Negative for heartburn, nausea and  vomiting.  Musculoskeletal: Negative.  Negative for myalgias.  Neurological: Negative.  Negative for dizziness, focal weakness, seizures and headaches.  Psychiatric/Behavioral: Negative.  Negative for suicidal ideas.    Past Medical History:  Diagnosis Date   Cancer ([place])    penile cancer   CHF (congestive heart failure) (HCC)    Chronic pain of right knee    Coronary artery disease    Hypertension    Phimosis     Past Surgical History:  Procedure Laterality Date   CIRCUMCISION N/A 10/12/2018   Procedure: CIRCUMCISION ADULT;  Surgeon: Franchot Gallo, MD;  Location: [facility];  Service: Urology;  Laterality: N/A;  45 mins   CO2 LASER APPLICATION N/A [id number]   Procedure: CO2 LASER APPLICATION;  Surgeon: Franchot Gallo, MD;  Location: [facility];  Service: Urology;  Laterality: N/A;  30 MINS   COLONOSCOPY     CORONARY ARTERY BYPASS GRAFT N/A 12/12/2018   Procedure: CORONARY ARTERY BYPASS GRAFTING (CABG) x four, using bilateral internal mammary arteries and right leg greater saphenous vein harvested endoscopically;  Surgeon: [name], MD;  Location: [place];  Service: Open Heart Surgery;  Laterality: N/A;   LEFT HEART CATH AND CORONARY ANGIOGRAPHY N/A 11/28/2018   Procedure: LEFT HEART CATH AND CORONARY ANGIOGRAPHY;  Surgeon: Burnell Blanks, MD;  Location: [place] [facility];  Service: Cardiovascular;  Laterality: N/A;   MEDIASTINAL EXPLORATION N/A 12/12/2018   Procedure: Mediastinal Re-Exploration after bypass graft;  Surgeon: [name], MD;  Location: [place];  Service: Open Heart Surgery;  Laterality: N/A;   TEE WITHOUT CARDIOVERSION N/A 12/12/2018  Procedure: TRANSESOPHAGEAL ECHOCARDIOGRAM (TEE);  Surgeon: [name], MD;  Location: [place];  Service: Open Heart Surgery;  Laterality: N/A;   TOOTH EXTRACTION     TOTAL HIP ARTHROPLASTY Right 03/08/2019   Procedure: RIGHT TOTAL HIP ARTHROPLASTY ANTERIOR APPROACH;  Surgeon: Mcarthur Rossetti, MD;   Location: [facility];  Service: Orthopedics;  Laterality: Right;    Family History  Problem Relation Age of Onset   Diabetes Mother    Heart disease Mother     Social History Reviewed with no changes to be made today.   Outpatient Medications Prior to Visit  Medication Sig Dispense Refill   aspirin EC 81 MG tablet Take 1 tablet (81 mg total) by mouth daily. Swallow whole. 90 tablet 3   atorvastatin (LIPITOR) 40 MG tablet Take 1 tablet (40 mg total) by mouth daily at 6 PM. 30 tablet 11   carvedilol (COREG) 6.25 MG tablet Take 1 tablet (6.25 mg total) by mouth 2 (two) times daily. 180 tablet 3   dapagliflozin propanediol (FARXIGA) 10 MG TABS tablet TAKE 1 TABLET (10 MG TOTAL) BY MOUTH DAILY BEFORE BREAKFAST. 30 tablet 11   furosemide (LASIX) 40 MG tablet Take 1 tablet (40 mg total) by mouth daily. 30 tablet 11   nitroGLYCERIN (NITROSTAT) 0.4 MG SL tablet Place 1 tablet (0.4 mg total) under the tongue every 5 (five) minutes as needed for chest pain. NO more than 3 pills--if you have the need to take 2 pills or more call MD 30 tablet 3   sacubitril-valsartan (ENTRESTO) 97-103 MG Take 1 tablet by mouth 2 (two) times daily. 180 tablet 3   spironolactone (ALDACTONE) 25 MG tablet TAKE 1 TABLET (25 MG TOTAL) BY MOUTH DAILY. 90 tablet 3   No facility-administered medications prior to visit.    No Known Allergies     Objective:    BP 103/65   Pulse 71   Resp 16   Wt 286 lb (129.7 kg)   SpO2 95%   BMI 36.72 kg/m  Wt Readings from Last 3 Encounters:  09/16/20 286 lb (129.7 kg)  09/07/20 286 lb (129.7 kg)  08/05/20 283 lb (128.4 kg)    Physical Exam Vitals and nursing note reviewed.  Constitutional:      Appearance: He is well-developed.  HENT:     Head: Normocephalic and atraumatic.  Cardiovascular:     Rate and Rhythm: Normal rate and regular rhythm.     Heart sounds: Normal heart sounds. No murmur heard.   No friction rub. No gallop.  Pulmonary:     Effort: Pulmonary effort is  normal. No tachypnea or respiratory distress.     Breath sounds: Normal breath sounds. No decreased breath sounds, wheezing, rhonchi or rales.  Chest:     Chest wall: No tenderness.  Abdominal:     General: Bowel sounds are normal.     Palpations: Abdomen is soft.  Musculoskeletal:        General: Normal range of motion.     Cervical back: Normal range of motion.  Skin:    General: Skin is warm and dry.  Neurological:     Mental Status: He is alert and oriented to person, place, and time.     Coordination: Coordination normal.  Psychiatric:        Behavior: Behavior normal. Behavior is cooperative.        Thought Content: Thought content normal.        Judgment: Judgment normal.  Patient has been counseled extensively about nutrition and exercise as well as the importance of adherence with medications and regular follow-up. The patient was given clear instructions to go to ER or return to medical center if symptoms don't improve, worsen or new problems develop. The patient verbalized understanding.   Follow-up: Return in about 4 months (around 01/16/2021).   [name], FNP-BC [facility] and [facility] [place], [place]   09/16/2020, 10:22 PM

## 2020-10-01 ENCOUNTER — Other Ambulatory Visit: Payer: Self-pay

## 2020-10-01 MED FILL — Spironolactone Tab 25 MG: ORAL | 30 days supply | Qty: 30 | Fill #5 | Status: AC

## 2020-10-01 MED FILL — Atorvastatin Calcium Tab 40 MG (Base Equivalent): ORAL | 30 days supply | Qty: 30 | Fill #4 | Status: AC

## 2020-10-02 ENCOUNTER — Other Ambulatory Visit: Payer: Self-pay

## 2020-10-07 ENCOUNTER — Ambulatory Visit (HOSPITAL_COMMUNITY)
Admission: RE | Admit: 2020-10-07 | Discharge: 2020-10-07 | Disposition: A | Discharge status: Home / Self Care | Payer: Self-pay | Source: Ambulatory Visit | Attending: Cardiology | Admitting: Cardiology

## 2020-10-07 ENCOUNTER — Other Ambulatory Visit: Payer: Self-pay

## 2020-10-07 VITALS — BP 117/72 | HR 59 | Wt 286.2 lb

## 2020-10-07 DIAGNOSIS — Z79899 Other long term (current) drug therapy: Secondary | ICD-10-CM | POA: Insufficient documentation

## 2020-10-07 DIAGNOSIS — I5022 Chronic systolic (congestive) heart failure: Secondary | ICD-10-CM | POA: Insufficient documentation

## 2020-10-07 DIAGNOSIS — Z951 Presence of aortocoronary bypass graft: Secondary | ICD-10-CM | POA: Insufficient documentation

## 2020-10-07 DIAGNOSIS — Z7982 Long term (current) use of aspirin: Secondary | ICD-10-CM | POA: Insufficient documentation

## 2020-10-07 DIAGNOSIS — I6529 Occlusion and stenosis of unspecified carotid artery: Secondary | ICD-10-CM | POA: Insufficient documentation

## 2020-10-07 DIAGNOSIS — I2510 Atherosclerotic heart disease of native coronary artery without angina pectoris: Secondary | ICD-10-CM | POA: Insufficient documentation

## 2020-10-07 DIAGNOSIS — I255 Ischemic cardiomyopathy: Secondary | ICD-10-CM | POA: Insufficient documentation

## 2020-10-07 NOTE — Patient Instructions (Addendum)
It was a pleasure seeing you today!  MEDICATIONS: -We are changing your medications today -Increase furosemide to 40 mg (1 tablet) twice daily for 3 days, then resume furosemide 40 mg (1 tablet) daily. -Call if you have questions about your medications.  NEXT APPOINTMENT: Return to clinic in 2 months with [facility].  In general, to take care of your heart failure: -Limit your fluid intake to 2 Liters (half-gallon) per day.   -Limit your salt intake to ideally 2-3 grams (2000-3000 mg) per day. -Weigh yourself daily and record, and bring that "weight diary" to your next appointment.  (Weight gain of 2-3 pounds in 1 day typically means fluid weight.) -The medications for your heart are to help your heart and help you live longer.   -Please contact us before stopping any of your heart medications.  Call the clinic at [phone] with questions or to reschedule future appointments.

## 2020-10-07 NOTE — Progress Notes (Signed)
PCP:  [name], NP   Cardiologist:  [name], MD HF Cardiology: Dr Aundra Dubin   HPI:  Frank Love is a 65 y.o. male with a history of HTN, CAD, LE edema, and elevated BNP.   He initially was seen by PCP in 05/2018 with elevated BNP and was started on lasix with cardiology referral, lasix was subsequently increased to 40 mg BID due to ongoing LE edema. He underwent cardiac evaluation for right hip surgery in 09/2018 with continued symptoms of LE edema on lasix 40 mg BID. Echo in [id number] showed systolic HF with EF [id number]. He was started on Entresto and metoprolol at this time.    He underwent cath 11/2018 which showed multivessel disease, had CABGx4 on 12/12/18. Repeat echo done post-CABG on 12/17/18 showed severely decreased right ventricular systolic function with severe enlargement in addition to small pericardial effusion, otherwise similar to previous. VQ scan was negative for PE. Transferred to CIR for rehab.    He had right THR done in [id number] with no complications.    Echo repeated 03/2019, EF 35-40%, mild LVH, mild LV dilation, moderately decreased RV systolic function, normal RV size.    Admitted to [facility] 12/13/19-01/03/20 for COVID-19 viral pneumonia requiring up to 30 L HFNC. Received remdesivir x5 days, steroid taper, tocilizumab/baricitinib. Hospitalization complicated by development of severe orthostatic hypotension and thrombocytopenia. Echo 12/14/19 with EF 45-50%. Heart Failure team consulted for on-going hypotension and bradycardia. Beta-blocker held, midodrine 15 mg TID started, and abdominal binder and TED hose applied. Patient discharged home off all GDMT medications due to hypotension and bradycardia.    Echo in 03/2020 showed EF 35-40%, diffuse hypokinesis, moderately decreased RV systolic function with normal RV size.  Technically difficult study.     He returned for followup of CHF on 09/07/20. Weight was up about 3 lbs. No chest pain. He was short of breath after  walking about 2 blocks. He was ok walking up 1 flight of stairs. No orthopnea/PND. No lightheadedness. Chronic L>R lower leg swelling with prior negative venous dopplers for DVT.   Today he returns to HF clinic for pharmacist medication titration. At last visit with MD, carvedilol was increased to 6.25 mg BID. Overall, feeling great. No dizziness, lightheadedness, fatigue, CP, or palpitations. Breathing is good. Still gets SOB after walking 2 blocks, but is not concerned about it. Can go up stairs/hills with no problems. Has been experiencing a cough over the past 3 weeks, some days worse than others. Says it is "mostly dry, but occasionally feels the need to cough something up". Home weight stable around 283 lbs. He has had this cough before when his volume was up. Is on furosemide 40 mg daily and did take 60 mg 3 times over the past month to see if it would help the cough, but did not see much of a change. Has 1+LEE, L>R but he states this is his normal. No PND/orthopnea. Lungs clear on auscultation. Does sleep in a recliner for back problems and not due to fluid. Appetite is ok. Patient is taking and tolerating all medications.   HF Medications: Carvedilol 6.25 mg BID Entresto 97/103 mg BID Spironolactone 25 mg daily Farxiga 10 mg daily Furosemide 40 mg daily  Has the patient been experiencing any side effects to the medications prescribed? No - patient is tolerating all his medications.  Does the patient have any problems obtaining medications due to transportation or finances? Pt has Medicare Part A. No prescription insurance. Has assistance for  Entresto with [employer] and [place] with [employer]  Understanding of regimen: good Understanding of indications: good Potential of compliance: good Patient understands to avoid NSAIDs. Patient understands to avoid decongestants.  Pertinent Lab Values: 09/07/20: Serum creatinine 1.19, BUN 30, Potassium 3.9, Sodium 139  Vital Signs: Weight: 286 lbs  (last clinic weight: 286 lbs) Blood pressure: 117/72  Heart rate: 59   Assessment/Plan: 1. Chronic systolic CHF: Ischemic cardiomyopathy. Pre-CABG echo with EF 30-35%, mild RV dysfunction. Post-CABG echo (11/2018) with EF 30-35%, severe RV systolic dysfunction. V/Q scan done, no evidence for PE. Hypotensive post-CABG requiring pressors and then midodrine, but improved over time.  Echo repeated 03/2019, EF 35-40% with moderate RV dysfunction, GIIDD. Cardiac MRI was recommended to quantify EF more exactly for purposes of ICD determination, however pt was adamant that he did not want an ICD and declined cMRI. Echo repeated 11/2019 LVEF improved to 45-50%, RV mildly reduced. Echo in 03/2020 was a technically difficult study, LV EF around 35-40% with moderate RV dysfunction. -NYHA class II symptoms. Mildly volume overloaded on exam.  - Increase furosemide to 40 mg BID for 3 days then resume 40 mg daily. - Continue carvedilol 6.25 mg BID - Continue Entresto 97/103 mg BID - Continue spironolactone 25 mg daily - Continue Farxiga 10 mg daily.  - Echoes have been difficult, EF seems to be in the 35% range.  Cardiac MRI ordered to quantify EF more exactly, but patient is not able to get MRI due to lack of insurance coverage. He will need eventual repeat echo to reassess for ICD placement, last echo appeared to be just out of range.   2. CAD: S/p CABG in 11/2018. No chest pain.  - Continue ASA 81 mg daily.  - Continue atorvastatin, good lipids in 07/2020.   3. Carotid stenosis: Carotid dopplers (12/2019) right ICA 40-59% stenosis, left ICA are consistent with a 1-39% stenosis.  - Repeat in 1 year (12/2020).  - Continue ASA and statin.  Follow-up on 12/08/20 with [facility]  Audry Riles, PharmD, BCPS, BCCP, CPP Heart Failure Clinic Pharmacist [phone]

## 2020-10-09 ENCOUNTER — Other Ambulatory Visit (HOSPITAL_COMMUNITY): Payer: Self-pay | Admitting: Emergency Medicine

## 2020-10-09 ENCOUNTER — Telehealth (HOSPITAL_COMMUNITY): Payer: Self-pay | Admitting: *Deleted

## 2020-10-09 ENCOUNTER — Other Ambulatory Visit (HOSPITAL_COMMUNITY): Payer: Self-pay | Admitting: *Deleted

## 2020-10-09 ENCOUNTER — Encounter (HOSPITAL_COMMUNITY): Payer: Self-pay

## 2020-10-09 ENCOUNTER — Telehealth (HOSPITAL_COMMUNITY): Payer: Self-pay | Admitting: Emergency Medicine

## 2020-10-09 DIAGNOSIS — I5022 Chronic systolic (congestive) heart failure: Secondary | ICD-10-CM

## 2020-10-09 DIAGNOSIS — Z01812 Encounter for preprocedural laboratory examination: Secondary | ICD-10-CM

## 2020-10-09 NOTE — Telephone Encounter (Signed)
Reaching out to patient to offer assistance regarding upcoming cardiac imaging study; pt verbalizes understanding of appt date/time, parking situation and where to check in, and verified current allergies; name and call back number provided for further questions should they arise  Frank Clement RN [place] [facility] [phone] office [phone] cell  Patient aware to get blood work prior to MRI scan.

## 2020-10-09 NOTE — Progress Notes (Signed)
CBC ordered for upcoming CMR to calculate ECV  Marchia Bond RN Navigator Cardiac Imaging [facility] [phone] Office  [phone] Cell

## 2020-10-09 NOTE — Telephone Encounter (Signed)
Attempted to call patient regarding upcoming cardiac MR appointment. Left message on voicemail with name and callback number Marchia Bond RN Navigator Cardiac Imaging [facility] [phone] Office [phone] Cell  Left message requesting patient get CBC drawn for upcoming CMR appt

## 2020-10-13 ENCOUNTER — Telehealth (HOSPITAL_COMMUNITY): Payer: Self-pay | Admitting: Emergency Medicine

## 2020-10-13 LAB — CBC
Hematocrit: 40.3 % (ref 37.5–51.0)
Hemoglobin: 13.2 g/dL (ref 13.0–17.7)
MCH: 27.8 pg (ref 26.6–33.0)
MCHC: 32.8 g/dL (ref 31.5–35.7)
MCV: 85 fL (ref 79–97)
Platelets: 176 10*3/uL (ref 150–450)
RBC: 4.75 x10E6/uL (ref 4.14–5.80)
RDW: 13.3 % (ref 11.6–15.4)
WBC: 6.3 10*3/uL (ref 3.4–10.8)

## 2020-10-13 NOTE — Telephone Encounter (Signed)
Reaching out to patient to offer assistance regarding upcoming cardiac imaging study; pt verbalizes understanding of appt date/time, parking situation and where to check in, and verified current allergies; name and call back number provided for further questions should they arise Kailani Brass RN Navigator Cardiac Imaging [place] [facility] [phone] office [phone] cell 

## 2020-10-14 ENCOUNTER — Other Ambulatory Visit: Payer: Self-pay

## 2020-10-14 ENCOUNTER — Ambulatory Visit (HOSPITAL_COMMUNITY)
Admission: RE | Admit: 2020-10-14 | Discharge: 2020-10-14 | Disposition: A | Discharge status: Home / Self Care | Payer: Self-pay | Source: Ambulatory Visit | Attending: Cardiology | Admitting: Cardiology

## 2020-10-14 DIAGNOSIS — I5022 Chronic systolic (congestive) heart failure: Secondary | ICD-10-CM

## 2020-10-14 MED ORDER — GADOBUTROL 1 MMOL/ML IV SOLN
16.0000 mL | Freq: Once | INTRAVENOUS | Status: AC | PRN
  Administered 2020-10-14: 16 mL via INTRAVENOUS

## 2020-10-20 ENCOUNTER — Telehealth (HOSPITAL_COMMUNITY): Payer: Self-pay | Admitting: *Deleted

## 2020-10-20 NOTE — Telephone Encounter (Signed)
Returned patient's call regarding cardiac MRI results. Per Dr Aundra Dubin recommending ICD placement. Pt aware of results and recommendations. He is not interested in ICD placement at this time, but would like to discuss with Dr Aundra Dubin at his next follow up appt. Dr Aundra Dubin aware of the above.   Emerson Monte RN [place] Coordinator  Office: [phone]  24/7 Pager: [phone]

## 2020-10-29 ENCOUNTER — Other Ambulatory Visit: Payer: Self-pay

## 2020-10-29 MED FILL — Spironolactone Tab 25 MG: ORAL | 30 days supply | Qty: 30 | Fill #6 | Status: AC

## 2020-10-29 MED FILL — Atorvastatin Calcium Tab 40 MG (Base Equivalent): ORAL | 30 days supply | Qty: 30 | Fill #5 | Status: AC

## 2020-11-03 ENCOUNTER — Other Ambulatory Visit: Payer: Self-pay

## 2020-12-01 ENCOUNTER — Other Ambulatory Visit: Payer: Self-pay

## 2020-12-01 MED FILL — Spironolactone Tab 25 MG: ORAL | 30 days supply | Qty: 30 | Fill #7 | Status: AC

## 2020-12-01 MED FILL — Atorvastatin Calcium Tab 40 MG (Base Equivalent): ORAL | 30 days supply | Qty: 30 | Fill #6 | Status: AC

## 2020-12-02 ENCOUNTER — Other Ambulatory Visit: Payer: Self-pay

## 2020-12-07 NOTE — Progress Notes (Incomplete)
PCP:  [name], NP  Cardiologist:  [name], MD HF Cardiology: Dr Aundra Dubin    History of Present Illness: Frank Love is a 65 y.o. male with a history of HTN, LE edema, and elevated BNP who returns for followup of CHF and CAD.   He initially was seen by PCP in 5/20 with elevated BNP and was started on lasix with cardiology referral, lasix was subsequently increased to 40 mg bid due to ongoing LE edema. He underwent cardiac evaluation for right hip surgery in September with continued symptoms of LE edema on lasix 40 bid. Echo in 9/38 showed systolic HF with EF 18-29%. He was started on entresto, metoprolol at this time.   He underwent cath 11/20 which showed multivessel disease, had CABGx4 12/12/18.  Repeat echo done post-CABG 12/17/18 showed severely decreased right ventricular systolic function with severe enlargement in addition to small pericardial effusion, otherwise similar to previous. VQ scan was negative for PE. Transferred to CIR for rehab.   He had right THR done in 9/37 with no complications.   Echo repeated 03/2019, EF 35-40%, mild LVH, mild LV dilation, moderately decreased RV systolic function, normal RV size.   Admitted to [facility] 11/19-12/10/21 for COVID-19 viral pneumonia requiring up to 30 L of HFNC.  Received remdesivir x 5 days, steroid taper, tocilizumab/baricitinib. Hospitalization complicated by development of severe orthostatic hypotension and thrombocytopenia. Echo 12/14/19 with EF 45-50%. Heart Failure team consulted for on-going hypotension and bradycardia. Beta-blocker held, midodrine 15 mg tid started, and abdominal binder and TED hose applied. Patient discharged home off all GDMT medications due to hypotension and bradycardia.   Echo in 3/22 showed EF 35-40%, diffuse hypokinesis, moderately decreased RV systolic function with normal RV size.  Technically difficult study.    He returns for followup of CHF.  Weight is up about 3 lbs.  No chest pain.  He is short of breath after walking about 2 blocks.  He is ok walking up 1 flight of stairs.  No orthopnea/PND.  No lightheadedness.  [place] L>R lower leg swelling with prior negative venous dopplers for DVT.   Today he returns for HF follow up. Overall feeling fine. Denies increasing SOB, CP, dizziness, edema, or PND/Orthopnea. Appetite ok. No fever or chills. Weight at home 170 pounds. Taking all medications.    ECG (personally reviewed): NSR, LAFB, old inferior MI, old anterior MI.     Labs (2/21): K 4.7, creatinine 1.07, LDL 61 Labs (3/21): K 4.3, creatinine 1.05, digoxin 0.9 Labs (5/21): K 4.3, creatinine 1.25  Labs (12/21): K 4.2 => 3.5, creatinine 1.17 => 0.91, plts 60K Labs (2/22): LDL 113, HDL 40 Labs (3/22): K 3.9, creatinine 1.12, BNP 272 Labs (7/22): LDL 59, HDL 41, K 3.7, creatinine 1.23 Labs (8/22): K 3.9, creatinine 1.19  PMH: 1. OA: s/p right THR.  2. HTN 3. Carotid stenosis: Carotid dopplers (11/20) with 40-59% RICA stenosis.  - Carotid dopplers (12/21)  right ICA 40-59% stenosis, left ICA mild stenosis.  4. CAD: LHC in 11/20 with totally occluded OM, totally occluded mid LAD, totally occluded mid RCA.  - CABG 11/20 with LIMA-LAD, RIMA-PDA, SVG-OM1, SVG-AM 5. Chronic systolic CHF: Ischemic cardiomyopathy.  - Echo (9/20): EF 30-35%, mildly decreased RV systolic function.  - Echo (11/20): EF 30-35%, severely dilated RV with severely decreased systolic function. - Echo (3/21): EF 35-40%, mild LV dilation with mild LVH, moderately decreased RV systolic function with normal RV size, PASP 33 mmHg.   -  Echo (11/21): LVEF improved to 45-50%. RV mildly reduced  - Echo (3/22): EF 35-40%, diffuse hypokinesis, moderately decreased RV systolic function with normal RV size.   6. V/Q scan negative for PE in 11/20.  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  8. COVID-19 11/21  Past Surgical History:  Procedure Laterality Date   CIRCUMCISION N/A 10/12/2018   Procedure: CIRCUMCISION  ADULT;  Surgeon: Franchot Gallo, MD;  [facility]: [facility];  Service: Urology;  Laterality: N/A;  45 mins   CO2 LASER APPLICATION N/A 08/31/4164   Procedure: CO2 LASER APPLICATION;  Surgeon: Franchot Gallo, MD;  [facility];  Service: Urology;  Laterality: N/A;  30 MINS   COLONOSCOPY     CORONARY ARTERY BYPASS GRAFT N/A 12/12/2018   Procedure: CORONARY ARTERY BYPASS GRAFTING (CABG) x four, using bilateral internal mammary arteries and right leg greater saphenous vein harvested endoscopically;  Surgeon: [name], MD;  Location: [place];  Service: Open Heart Surgery;  Laterality: N/A;   LEFT HEART CATH AND CORONARY ANGIOGRAPHY N/A 11/28/2018   Procedure: LEFT HEART CATH AND CORONARY ANGIOGRAPHY;  Surgeon: Burnell Blanks, MD;  Location: [place] [facility];  Service: Cardiovascular;  Laterality: N/A;   MEDIASTINAL EXPLORATION N/A 12/12/2018   Procedure: Mediastinal Re-Exploration after bypass graft;  Surgeon: [name], MD;  Location: [place];  Service: Open Heart Surgery;  Laterality: N/A;   TEE WITHOUT CARDIOVERSION N/A 12/12/2018   Procedure: TRANSESOPHAGEAL ECHOCARDIOGRAM (TEE);  Surgeon: [name], MD;  Location: [place];  Service: Open Heart Surgery;  Laterality: N/A;   TOOTH EXTRACTION     TOTAL HIP ARTHROPLASTY Right 03/08/2019   Procedure: RIGHT TOTAL HIP ARTHROPLASTY ANTERIOR APPROACH;  Surgeon: Mcarthur Rossetti, MD;  [facility];  Service: Orthopedics;  Laterality: Right;    Current Outpatient Medications  Medication Sig Dispense Refill   aspirin EC 81 MG tablet Take 1 tablet (81 mg total) by mouth daily. Swallow whole. 90 tablet 3   atorvastatin (LIPITOR) 40 MG tablet Take 1 tablet (40 mg total) by mouth daily at 6 PM. 30 tablet 11   carvedilol (COREG) 6.25 MG tablet Take 1 tablet (6.25 mg total) by mouth 2 (two) times daily. 180 tablet 3   dapagliflozin propanediol (FARXIGA) 10 MG TABS tablet TAKE 1 TABLET (10 MG TOTAL) BY MOUTH DAILY BEFORE  BREAKFAST. 30 tablet 11   furosemide (LASIX) 40 MG tablet Take 1 tablet (40 mg total) by mouth daily. 30 tablet 11   nitroGLYCERIN (NITROSTAT) 0.4 MG SL tablet Place 1 tablet (0.4 mg total) under the tongue every 5 (five) minutes as needed for chest pain. NO more than 3 pills--if you have the need to take 2 pills or more call MD 30 tablet 3   sacubitril-valsartan (ENTRESTO) 97-103 MG Take 1 tablet by mouth 2 (two) times daily. 180 tablet 3   spironolactone (ALDACTONE) 25 MG tablet TAKE 1 TABLET (25 MG TOTAL) BY MOUTH DAILY. 90 tablet 3   No current facility-administered medications for this visit.    Allergies:   Patient has no known allergies.   Social History:  The patient  reports that he has never smoked. He has never used smokeless tobacco. He reports that he does not drink alcohol and does not use drugs.   Family History:  The patient's family history includes Diabetes in his mother; Heart disease in his mother.   ROS:  Please see the history of present illness.   All other systems are personally reviewed and negative.  Exam:  There were no vitals taken for this visit.   Physical Exam  General: NAD Neck: No JVD, no thyromegaly or thyroid nodule.  Lungs: Clear to auscultation bilaterally with normal respiratory effort. CV: Nondisplaced PMI.  Heart regular S1/S2, no S3/S4, no murmur.  1+ ankle edema L>R.  No carotid bruit.  Normal pedal pulses.  Abdomen: Soft, nontender, no hepatosplenomegaly, no distention.  Skin: Intact without lesions or rashes.  Neurologic: Alert and oriented x 3.  Psych: Normal affect. Extremities: No clubbing or cyanosis.  HEENT: Normal.   Recent Labs: 12/23/2019: TSH 0.059 12/27/2019: Magnesium 1.9 01/01/2020: ALT 91 09/07/2020: B Natriuretic Peptide 214.5; BUN 30; Creatinine, Ser 1.19; Potassium 3.9; Sodium 139 10/12/2020: Hemoglobin 13.2; Platelets 176  Personally reviewed   Wt Readings from Last 3 Encounters:  10/07/20 129.8 kg (286 lb 3.2 oz)   09/16/20 129.7 kg (286 lb)  09/07/20 129.7 kg (286 lb)    ASSESSMENT AND PLAN:  1. CAD: S/p CABG in 11/20. No chest pain.  - Continue ASA 81 mg daily.  - Continue atorvastatin, good lipids in 7/22.  2. Chronic systolic CHF: Ischemic cardiomyopathy.  Pre-CABG echo with EF 30-35%, mild RV dysfunction.  Post-CABG echo (11/20) with EF 30-35%, severe RV systolic dysfunction.  V/Q scan done, no evidence for PE. Hypotensive post-CABG requiring pressors and then midodrine, but improved over time.  Echo repeated 3/21, EF 35-40% with moderate RV dysfunction, GIIDD. Cardiac MRI was recommended to quantify EF more exactly for purposes of ICD determination, however pt was adamant that he did not want an ICD and declined cMRI. Echo repeated 11/21 LVEF improved to 45-50%, RV mildly reduced. Echo in 3/22 was a technically difficult study, LV EF around 35-40% with moderate RV dysfunction. NYHA class II symptoms.  He is not volume overloaded on exam. .  - Continue Lasix 40 mg daily.  BMET today.  - Continue  Farxiga 10 mg daily.  - Continue spironolactone 25 mg daily.    - Continue Entresto 97/103 bid.   - Continue Coreg 6.25 mg bid.  - Echoes have been difficult, EF seems to be in the 35% range.  Cardiac MRI ordered to quantify EF more exactly, but patient is not able to get MRI due to lack of insurance coverage.   He will need eventual repeat echo to reassess for ICD placement, last echo appeared to be just out of range.  3. Carotid stenosis: Carotid dopplers (12/21) right ICA 40-59% stenosis, left ICA are consistent with a 1-39% stenosis.  - Repeat in 1 year (12/22).  - Continue ASA and statin.   Followup in 3 months with Dr. Aundra Dubin + echo.  Signed, Rafael Bihari, FNP  12/07/2020    [facility] [address] Heart and Vascular [place] [address] [phone] (office) [phone] (fax)

## 2020-12-08 ENCOUNTER — Encounter (HOSPITAL_COMMUNITY): Payer: Self-pay

## 2020-12-31 ENCOUNTER — Other Ambulatory Visit: Payer: Self-pay

## 2020-12-31 MED FILL — Spironolactone Tab 25 MG: ORAL | 30 days supply | Qty: 30 | Fill #8 | Status: AC

## 2020-12-31 MED FILL — Atorvastatin Calcium Tab 40 MG (Base Equivalent): ORAL | 30 days supply | Qty: 30 | Fill #7 | Status: AC

## 2021-01-01 ENCOUNTER — Encounter: Payer: Self-pay | Admitting: Nurse Practitioner

## 2021-01-01 ENCOUNTER — Other Ambulatory Visit: Payer: Self-pay

## 2021-01-12 ENCOUNTER — Telehealth: Payer: Self-pay | Admitting: Cardiology

## 2021-01-12 NOTE — Telephone Encounter (Signed)
Patient called to reschedule carotid test, but the order expires tomorrow. He requests a new order be put in so he can reschedule.

## 2021-01-13 ENCOUNTER — Other Ambulatory Visit: Payer: Self-pay

## 2021-01-13 ENCOUNTER — Encounter (HOSPITAL_COMMUNITY): Payer: Self-pay

## 2021-01-13 DIAGNOSIS — I6523 Occlusion and stenosis of bilateral carotid arteries: Secondary | ICD-10-CM

## 2021-01-13 NOTE — Telephone Encounter (Signed)
New order placed per Dr. Percival Spanish.

## 2021-01-13 NOTE — Progress Notes (Signed)
Repeat order placed

## 2021-01-19 ENCOUNTER — Ambulatory Visit: Payer: Self-pay | Admitting: Nurse Practitioner

## 2021-01-19 ENCOUNTER — Other Ambulatory Visit: Payer: Self-pay

## 2021-01-27 ENCOUNTER — Other Ambulatory Visit: Payer: Self-pay

## 2021-01-27 ENCOUNTER — Other Ambulatory Visit (HOSPITAL_COMMUNITY): Payer: Self-pay | Admitting: Cardiology

## 2021-01-27 ENCOUNTER — Ambulatory Visit (HOSPITAL_COMMUNITY)
Admission: RE | Admit: 2021-01-27 | Discharge: 2021-01-27 | Disposition: A | Discharge status: Home / Self Care | Payer: Self-pay | Source: Ambulatory Visit | Attending: Internal Medicine | Admitting: Internal Medicine

## 2021-01-27 DIAGNOSIS — I6523 Occlusion and stenosis of bilateral carotid arteries: Secondary | ICD-10-CM | POA: Insufficient documentation

## 2021-01-28 NOTE — Progress Notes (Signed)
PCP:  [name], NP  Cardiologist:  [name], MD HF Cardiology: Dr Aundra Dubin    History of Present Illness: Frank Love is a 66 y.o. male with a history of HTN, LE edema, and elevated BNP who returns for followup of CHF and CAD.   He initially was seen by PCP in 5/20 with elevated BNP and was started on lasix with cardiology referral, lasix was subsequently increased to 40 mg bid due to ongoing LE edema. He underwent cardiac evaluation for right hip surgery in September with continued symptoms of LE edema on lasix 40 bid. Echo in 3/78 showed systolic HF with EF 58-85%. He was started on entresto, metoprolol at this time.   He underwent cath 11/20 which showed multivessel disease, had CABGx4 12/12/18.  Repeat echo done post-CABG 12/17/18 showed severely decreased right ventricular systolic function with severe enlargement in addition to small pericardial effusion, otherwise similar to previous. VQ scan was negative for PE. Transferred to CIR for rehab.   He had right THR done in 0/27 with no complications.   Echo repeated 03/2019, EF 35-40%, mild LVH, mild LV dilation, moderately decreased RV systolic function, normal RV size.   Admitted to [facility] 11/19-12/10/21 for COVID-19 viral pneumonia requiring up to 30 L of HFNC.  Received remdesivir x 5 days, steroid taper, tocilizumab/baricitinib. Hospitalization complicated by development of severe orthostatic hypotension and thrombocytopenia. Echo 12/14/19 with EF 45-50%. Heart Failure team consulted for on-going hypotension and bradycardia. Beta-blocker held, midodrine 15 mg tid started, and abdominal binder and TED hose applied. Patient discharged home off all GDMT medications due to hypotension and bradycardia.   Echo in 3/22 showed EF 35-40%, diffuse hypokinesis, moderately decreased RV systolic function with normal RV size.  Technically difficult study.    cMRI (9/22) showed LVEF 33%, RVEF 30%, subendocardial LGE suggestive of  coronary disease.  Today he returns for HF follow up. Overall feeling fine. No significant dyspnea walking up a flight of stairs, some SOB walking further than 2 blocks. Had a period of positional dizziness for 3 weeks, but has since resolved. Continues with chronic L>R LEE. Denies abnormal bleeding, palpitations, CP, dizziness. Chronically sleeps in recliner due to low back pain.  Appetite ok. No fever or chills. Weight at home 277 pounds. Taking all medications.   ECG (personally reviewed): none ordered today.  Labs (2/21): K 4.7, creatinine 1.07, LDL 61 Labs (3/21): K 4.3, creatinine 1.05, digoxin 0.9 Labs (5/21): K 4.3, creatinine 1.25  Labs (12/21): K 4.2 => 3.5, creatinine 1.17 => 0.91, plts 60K Labs (2/22): LDL 113, HDL 40 Labs (3/22): K 3.9, creatinine 1.12, BNP 272 Labs (7/22): LDL 59, HDL 41, K 3.7, creatinine 1.23 Labs (8/22): K 3.9, creatinine 1.19  PMH: 1. OA: s/p right THR.  2. HTN 3. Carotid stenosis: Carotid dopplers (11/20) with 40-59% RICA stenosis.  - Carotid dopplers (12/21)  right ICA 40-59% stenosis, left ICA mild stenosis.  - Carotid dopplers (1/23) right ICA 40-59% stenosis, left ICA 1-39% stenosis. 4. CAD: LHC in 11/20 with totally occluded OM, totally occluded mid LAD, totally occluded mid RCA.  - CABG 11/20 with LIMA-LAD, RIMA-PDA, SVG-OM1, SVG-AM. 5. Chronic systolic CHF: Ischemic cardiomyopathy.  - Echo (9/20): EF 30-35%, mildly decreased RV systolic function.  - Echo (11/20): EF 30-35%, severely dilated RV with severely decreased systolic function. - Echo (3/21): EF 35-40%, mild LV dilation with mild LVH, moderately decreased RV systolic function with normal RV size, PASP 33 mmHg.   -  Echo (11/21): LVEF improved to 45-50%. RV mildly reduced.  - Echo (3/22): EF 35-40%, diffuse hypokinesis, moderately decreased RV systolic function with normal RV size.   - cMRI (9/22): LVEF 33%, RVEF 30%, delayed enhancement pattern suggestive of coronary disease. 6. V/Q scan  negative for PE in 11/20.  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  8. COVID-19 11/21  Past Surgical History:  Procedure Laterality Date   CIRCUMCISION N/A 10/12/2018   Procedure: CIRCUMCISION ADULT;  Surgeon: Franchot Gallo, MD;  [facility];  Service: Urology;  Laterality: N/A;  45 mins   CO2 LASER APPLICATION N/A 08/02/238   Procedure: CO2 LASER APPLICATION;  Surgeon: Franchot Gallo, MD;  [facility];  Service: Urology;  Laterality: N/A;  30 MINS   COLONOSCOPY     CORONARY ARTERY BYPASS GRAFT N/A 12/12/2018   Procedure: CORONARY ARTERY BYPASS GRAFTING (CABG) x four, using bilateral internal mammary arteries and right leg greater saphenous vein harvested endoscopically;  Surgeon: [name], MD;  Location: [place];  Service: Open Heart Surgery;  Laterality: N/A;   LEFT HEART CATH AND CORONARY ANGIOGRAPHY N/A 11/28/2018   Procedure: LEFT HEART CATH AND CORONARY ANGIOGRAPHY;  Surgeon: Burnell Blanks, MD;  Location: [place] [facility];  Service: Cardiovascular;  Laterality: N/A;   MEDIASTINAL EXPLORATION N/A 12/12/2018   Procedure: Mediastinal Re-Exploration after bypass graft;  Surgeon: [name], MD;  Location: [place];  Service: Open Heart Surgery;  Laterality: N/A;   TEE WITHOUT CARDIOVERSION N/A 12/12/2018   Procedure: TRANSESOPHAGEAL ECHOCARDIOGRAM (TEE);  Surgeon: [name], MD;  Location: [place];  Service: Open Heart Surgery;  Laterality: N/A;   TOOTH EXTRACTION     TOTAL HIP ARTHROPLASTY Right 03/08/2019   Procedure: RIGHT TOTAL HIP ARTHROPLASTY ANTERIOR APPROACH;  Surgeon: Mcarthur Rossetti, MD;  [facility];  Service: Orthopedics;  Laterality: Right;   Current Outpatient Medications  Medication Sig Dispense Refill   aspirin EC 81 MG tablet Take 1 tablet (81 mg total) by mouth daily. Swallow whole. 90 tablet 3   atorvastatin (LIPITOR) 40 MG tablet Take 1 tablet (40 mg total) by mouth daily at 6 PM. 30 tablet 11   carvedilol (COREG)  6.25 MG tablet Take 1 tablet (6.25 mg total) by mouth 2 (two) times daily. 180 tablet 3   dapagliflozin propanediol (FARXIGA) 10 MG TABS tablet TAKE 1 TABLET (10 MG TOTAL) BY MOUTH DAILY BEFORE BREAKFAST. 30 tablet 11   furosemide (LASIX) 40 MG tablet Take 1 tablet (40 mg total) by mouth daily. 30 tablet 11   nitroGLYCERIN (NITROSTAT) 0.4 MG SL tablet Place 1 tablet (0.4 mg total) under the tongue every 5 (five) minutes as needed for chest pain. NO more than 3 pills--if you have the need to take 2 pills or more call MD 30 tablet 3   sacubitril-valsartan (ENTRESTO) 97-103 MG Take 1 tablet by mouth 2 (two) times daily. 180 tablet 3   spironolactone (ALDACTONE) 25 MG tablet TAKE 1 TABLET (25 MG TOTAL) BY MOUTH DAILY. 90 tablet 3   No current facility-administered medications for this encounter.    Allergies:   Patient has no known allergies.   Social History:  The patient  reports that he has never smoked. He has never used smokeless tobacco. He reports that he does not drink alcohol and does not use drugs.   Family History:  The patient's family history includes Diabetes in his mother; Heart disease in his mother.   ROS:  Please see the history  of present illness.   All other systems are personally reviewed and negative.   Exam:  BP 122/62    Pulse 60    Wt 125.4 kg (276 lb 6.4 oz)    SpO2 94%    BMI 35.49 kg/m    Physical Exam  General:  NAD. No resp difficulty HEENT: Normal Neck: Supple. No JVD. Carotids 2+ bilat; no bruits. No lymphadenopathy or thryomegaly appreciated. Cor: PMI nondisplaced. Regular rate & rhythm. No rubs, gallops or murmurs. Lungs: Clear Abdomen: Obese, nontender, nondistended. No hepatosplenomegaly. No bruits or masses. Good bowel sounds. Extremities: No cyanosis, clubbing, rash, edema Neuro: Alert & oriented x 3, cranial nerves grossly intact. Moves all 4 extremities w/o difficulty. Affect pleasant.  Recent Labs: 09/07/2020: B Natriuretic Peptide 214.5; BUN 30;  Creatinine, Ser 1.19; Potassium 3.9; Sodium 139 10/12/2020: Hemoglobin 13.2; Platelets 176  Personally reviewed   Wt Readings from Last 3 Encounters:  01/29/21 125.4 kg (276 lb 6.4 oz)  10/07/20 129.8 kg (286 lb 3.2 oz)  09/16/20 129.7 kg (286 lb)    ASSESSMENT AND PLAN: 1. CAD: S/p CABG in 11/20. No chest pain.  - Continue ASA 81 mg daily.  - Continue atorvastatin, good lipids in 7/22.  2. Chronic systolic CHF: Ischemic cardiomyopathy.  Pre-CABG echo with EF 30-35%, mild RV dysfunction.  Post-CABG echo (11/20) with EF 30-35%, severe RV systolic dysfunction.  V/Q scan done, no evidence for PE. Hypotensive post-CABG requiring pressors and then midodrine, but improved over time.  Echo repeated 3/21, EF 35-40% with moderate RV dysfunction, GIIDD. Cardiac MRI was recommended to quantify EF more exactly for purposes of ICD determination, however pt was adamant that he did not want an ICD and declined cMRI. Echo repeated 11/21 LVEF improved to 45-50%, RV mildly reduced. Echo in 3/22 was a technically difficult study, LV EF around 35-40% with moderate RV dysfunction. cMRI 9/22 showed LVEF 33%. NYHA class II symptoms.  He is not volume overloaded on exam.  - Continue Lasix 40 mg daily.  BMET today.  - Continue Farxiga 10 mg daily.  - Continue spironolactone 25 mg daily.    - Continue Entresto 97/103 mg bid.   - Continue Coreg 6.25 mg bid.  - We discussed referral to EP to discuss ICD. He is on the fence but willing to have a conversation with EP, will arrange referral. 3. Carotid stenosis: Carotid dopplers (12/21) right ICA 40-59% stenosis, left ICA are consistent with a 1-39% stenosis. Carotid dopplers (1/23) right ICA 40-59% stenosis, left ICA 1-39% stenosis. - Repeat in 1 year (1/24).  - Continue ASA and statin.   Followup with Dr. Aundra Dubin in 3-4 months.  Signed, Rafael Bihari, FNP  01/29/2021   [facility] [address] Heart and [place]  [place] [address] [phone] (office) [phone] (fax)

## 2021-01-29 ENCOUNTER — Other Ambulatory Visit: Payer: Self-pay

## 2021-01-29 ENCOUNTER — Encounter (HOSPITAL_COMMUNITY): Payer: Self-pay

## 2021-01-29 ENCOUNTER — Ambulatory Visit (HOSPITAL_COMMUNITY)
Admission: RE | Admit: 2021-01-29 | Discharge: 2021-01-29 | Disposition: A | Discharge status: Home / Self Care | Payer: Self-pay | Source: Ambulatory Visit | Attending: Family Medicine | Admitting: Family Medicine

## 2021-01-29 VITALS — BP 122/62 | HR 60 | Wt 276.4 lb

## 2021-01-29 DIAGNOSIS — I2510 Atherosclerotic heart disease of native coronary artery without angina pectoris: Secondary | ICD-10-CM

## 2021-01-29 DIAGNOSIS — I6523 Occlusion and stenosis of bilateral carotid arteries: Secondary | ICD-10-CM

## 2021-01-29 DIAGNOSIS — M545 Low back pain, unspecified: Secondary | ICD-10-CM | POA: Insufficient documentation

## 2021-01-29 DIAGNOSIS — Z7984 Long term (current) use of oral hypoglycemic drugs: Secondary | ICD-10-CM | POA: Insufficient documentation

## 2021-01-29 DIAGNOSIS — Z7982 Long term (current) use of aspirin: Secondary | ICD-10-CM | POA: Insufficient documentation

## 2021-01-29 DIAGNOSIS — Z8249 Family history of ischemic heart disease and other diseases of the circulatory system: Secondary | ICD-10-CM | POA: Insufficient documentation

## 2021-01-29 DIAGNOSIS — I5022 Chronic systolic (congestive) heart failure: Secondary | ICD-10-CM

## 2021-01-29 DIAGNOSIS — Z79899 Other long term (current) drug therapy: Secondary | ICD-10-CM | POA: Insufficient documentation

## 2021-01-29 DIAGNOSIS — I110 Hypertensive heart disease with heart failure: Secondary | ICD-10-CM | POA: Insufficient documentation

## 2021-01-29 DIAGNOSIS — Z951 Presence of aortocoronary bypass graft: Secondary | ICD-10-CM | POA: Insufficient documentation

## 2021-01-29 LAB — BASIC METABOLIC PANEL
Anion gap: 8 (ref 5–15)
BUN: 28 mg/dL — ABNORMAL HIGH (ref 8–23)
CO2: 24 mmol/L (ref 22–32)
Calcium: 8.7 mg/dL — ABNORMAL LOW (ref 8.9–10.3)
Chloride: 107 mmol/L (ref 98–111)
Creatinine, Ser: 1.17 mg/dL (ref 0.61–1.24)
GFR, Estimated: >60 mL/min (ref >60)
Glucose, Bld: 110 mg/dL — ABNORMAL HIGH (ref 70–99)
Potassium: 3.8 mmol/L (ref 3.5–5.1)
Sodium: 139 mmol/L (ref 135–145)

## 2021-01-29 NOTE — Patient Instructions (Signed)
It was great to see you today! No medication changes are needed at this time.  Labs today We will only contact you if something comes back abnormal or we need to make some changes. Otherwise no news is good news!  You have been referred to [facility] Address: [place] [address] Phone: [phone] -they will call with an appointment  Your physician recommends that you schedule a follow-up appointment in: 3-4 months with Dr Aundra Dubin  Do the following things EVERYDAY: Weigh yourself in the morning before breakfast. Write it down and keep it in a log. Take your medicines as prescribed Eat low salt foods--Limit salt (sodium) to 2000 mg per day.  Stay as active as you can everyday Limit all fluids for the day to less than 2 liters  At the [facility], you and your health needs are our priority. As part of our continuing mission to provide you with exceptional heart care, we have created designated Provider Care Teams. These Care Teams include your primary Cardiologist (physician) and Advanced Practice Providers (APPs- Physician Assistants and Nurse Practitioners) who all work together to provide you with the care you need, when you need it.   You may see any of the following providers on your designated Care Team at your next follow up: Dr Glori Bickers Dr Haynes Kerns, NP Lyda Jester, [place] [facility] [place], [place] Audry Riles, PharmD   Please be sure to bring in all your medications bottles to every appointment.    If you have any questions or concerns before your next appointment please send [place] a message through [place] or call our office at [phone].    TO LEAVE A MESSAGE FOR THE NURSE SELECT OPTION 2, PLEASE LEAVE A MESSAGE INCLUDING: YOUR NAME DATE OF BIRTH CALL BACK NUMBER REASON FOR CALL**this is important as we prioritize the call backs  YOU WILL RECEIVE A CALL BACK THE SAME DAY AS LONG  AS YOU CALL BEFORE 4:00 PM

## 2021-02-02 ENCOUNTER — Other Ambulatory Visit: Payer: Self-pay

## 2021-02-02 MED FILL — Spironolactone Tab 25 MG: ORAL | 30 days supply | Qty: 30 | Fill #0 | Status: AC

## 2021-02-02 MED FILL — Atorvastatin Calcium Tab 40 MG (Base Equivalent): ORAL | 30 days supply | Qty: 30 | Fill #0 | Status: AC

## 2021-02-02 MED FILL — Spironolactone Tab 25 MG: ORAL | 30 days supply | Qty: 30 | Fill #9 | Status: CN

## 2021-02-02 MED FILL — Atorvastatin Calcium Tab 40 MG (Base Equivalent): ORAL | 30 days supply | Qty: 30 | Fill #8 | Status: CN

## 2021-02-04 ENCOUNTER — Other Ambulatory Visit: Payer: Self-pay

## 2021-02-16 ENCOUNTER — Other Ambulatory Visit: Payer: Self-pay

## 2021-02-17 ENCOUNTER — Other Ambulatory Visit: Payer: Self-pay

## 2021-03-02 ENCOUNTER — Other Ambulatory Visit: Payer: Self-pay

## 2021-03-02 MED FILL — Atorvastatin Calcium Tab 40 MG (Base Equivalent): ORAL | 30 days supply | Qty: 30 | Fill #1 | Status: AC

## 2021-03-02 MED FILL — Spironolactone Tab 25 MG: ORAL | 30 days supply | Qty: 30 | Fill #1 | Status: AC

## 2021-03-03 ENCOUNTER — Ambulatory Visit: Payer: Self-pay | Admitting: Nurse Practitioner

## 2021-03-04 ENCOUNTER — Other Ambulatory Visit: Payer: Self-pay

## 2021-03-16 ENCOUNTER — Other Ambulatory Visit: Payer: Self-pay

## 2021-03-16 ENCOUNTER — Other Ambulatory Visit (HOSPITAL_COMMUNITY): Payer: Self-pay

## 2021-03-16 ENCOUNTER — Telehealth (HOSPITAL_COMMUNITY): Payer: Self-pay | Admitting: Pharmacy Technician

## 2021-03-16 NOTE — Telephone Encounter (Signed)
Advanced Heart Failure Patient Advocate Encounter  Received a notice from [employer] that it is time to renew [place] assistance. Let voicemail for patient to call back and start the re-enrollment process.   Received a notice from [employer] that it is time to renew [place] assistance. The patient is currently uninsured and can utilize the HF fund through [facility] outpatient to obtain a refill. Will not seek renewal at this time.   Charlann Boxer, CPhT

## 2021-03-17 ENCOUNTER — Other Ambulatory Visit (HOSPITAL_COMMUNITY): Payer: Self-pay | Admitting: *Deleted

## 2021-03-17 ENCOUNTER — Other Ambulatory Visit (HOSPITAL_COMMUNITY): Payer: Self-pay

## 2021-03-17 ENCOUNTER — Other Ambulatory Visit: Payer: Self-pay

## 2021-03-17 MED ORDER — ATORVASTATIN CALCIUM 40 MG PO TABS
40.0000 mg | ORAL_TABLET | Freq: Every day | ORAL | 3 refills | Status: DC
  Filled 2021-03-17 (×2): qty 30, 30d supply, fill #0
  Filled 2021-04-27: qty 30, 30d supply, fill #1
  Filled 2021-06-02: qty 30, 30d supply, fill #2
  Filled 2021-07-05: qty 30, 30d supply, fill #3

## 2021-03-17 MED ORDER — FUROSEMIDE 40 MG PO TABS
40.0000 mg | ORAL_TABLET | Freq: Every day | ORAL | 3 refills | Status: DC
  Filled 2021-03-17 (×2): qty 30, 30d supply, fill #0
  Filled 2021-04-27: qty 30, 30d supply, fill #1
  Filled 2021-06-02: qty 30, 30d supply, fill #2
  Filled 2021-07-05: qty 30, 30d supply, fill #3

## 2021-03-17 MED ORDER — CARVEDILOL 6.25 MG PO TABS
6.2500 mg | ORAL_TABLET | Freq: Two times a day (BID) | ORAL | 3 refills | Status: DC
  Filled 2021-03-17 (×2): qty 180, 90d supply, fill #0

## 2021-03-17 MED ORDER — DAPAGLIFLOZIN PROPANEDIOL 10 MG PO TABS
ORAL_TABLET | Freq: Every day | ORAL | 3 refills | Status: DC
  Filled 2021-03-17 (×2): qty 30, 30d supply, fill #0

## 2021-03-17 MED ORDER — SPIRONOLACTONE 25 MG PO TABS
25.0000 mg | ORAL_TABLET | Freq: Every day | ORAL | 3 refills | Status: DC
Start: 2021-03-17 — End: 2021-07-28
  Filled 2021-03-17: qty 30, 30d supply, fill #0
  Filled 2021-03-17: qty 30, fill #0
  Filled 2021-04-27: qty 30, 30d supply, fill #1
  Filled 2021-06-02: qty 30, 30d supply, fill #2
  Filled 2021-07-05: qty 30, 30d supply, fill #3

## 2021-03-17 NOTE — Telephone Encounter (Signed)
Spoke with the patient, sent docusign link for signature. He is aware that POI is required for the whole household.

## 2021-03-22 ENCOUNTER — Ambulatory Visit (INDEPENDENT_AMBULATORY_CARE_PROVIDER_SITE_OTHER): Payer: Self-pay | Admitting: Cardiology

## 2021-03-22 ENCOUNTER — Encounter: Payer: Self-pay | Admitting: Cardiology

## 2021-03-22 ENCOUNTER — Other Ambulatory Visit: Payer: Self-pay

## 2021-03-22 VITALS — BP 134/74 | HR 59 | Ht 75.0 in | Wt 285.7 lb

## 2021-03-22 DIAGNOSIS — I255 Ischemic cardiomyopathy: Secondary | ICD-10-CM

## 2021-03-22 DIAGNOSIS — I5022 Chronic systolic (congestive) heart failure: Secondary | ICD-10-CM

## 2021-03-22 DIAGNOSIS — I2510 Atherosclerotic heart disease of native coronary artery without angina pectoris: Secondary | ICD-10-CM

## 2021-03-22 NOTE — Progress Notes (Signed)
Electrophysiology Office Note:    Date:  03/22/2021   ID:  SILVESTRE MINES, DOB 18-Oct-1955, MRN [id number]  PCP:  [name], NP  [facility] Cardiologist:  [name], MD  [facility] Electrophysiologist:  Vickie Epley, MD   Referring MD: Rafael Bihari, FNP   Chief Complaint: Discuss ICD device  History of Present Illness:    Frank Love is a 66 y.o. male who presents for an evaluation of possible ICD at the request of Dr. Aundra Dubin. Their medical history includes hypertension, coronary artery disease, congestive heart failure, and cancer.  He is accompanied by a family member today. Overall, he appears well.  We reviewed procedural details of ICD implantation at length.  He denies any palpitations, chest pain, shortness of breath, or peripheral edema. No lightheadedness, headaches, syncope, orthopnea, or PND.  (+)    Past Medical History:  Diagnosis Date   Cancer ([place])    penile cancer   CHF (congestive heart failure) (HCC)    Chronic pain of right knee    Coronary artery disease    Hypertension    Phimosis     Past Surgical History:  Procedure Laterality Date   CIRCUMCISION N/A 10/12/2018   Procedure: CIRCUMCISION ADULT;  Surgeon: Franchot Gallo, MD;  Location: [facility];  Service: Urology;  Laterality: N/A;  45 mins   CO2 LASER APPLICATION N/A 06/26/1306   Procedure: CO2 LASER APPLICATION;  Surgeon: Franchot Gallo, MD;  Location: [facility];  Service: Urology;  Laterality: N/A;  30 MINS   COLONOSCOPY     CORONARY ARTERY BYPASS GRAFT N/A 12/12/2018   Procedure: CORONARY ARTERY BYPASS GRAFTING (CABG) x four, using bilateral internal mammary arteries and right leg greater saphenous vein harvested endoscopically;  Surgeon: [name], MD;  Location: [place];  Service: Open Heart Surgery;  Laterality: N/A;   LEFT HEART CATH AND CORONARY ANGIOGRAPHY N/A 11/28/2018   Procedure: LEFT HEART CATH AND CORONARY ANGIOGRAPHY;  Surgeon: Burnell Blanks, MD;  Location: [place] [facility];  Service: Cardiovascular;  Laterality: N/A;   MEDIASTINAL EXPLORATION N/A 12/12/2018   Procedure: Mediastinal Re-Exploration after bypass graft;  Surgeon: [name], MD;  Location: [place];  Service: Open Heart Surgery;  Laterality: N/A;   TEE WITHOUT CARDIOVERSION N/A 12/12/2018   Procedure: TRANSESOPHAGEAL ECHOCARDIOGRAM (TEE);  Surgeon: [name], MD;  Location: [place];  Service: Open Heart Surgery;  Laterality: N/A;   TOOTH EXTRACTION     TOTAL HIP ARTHROPLASTY Right 03/08/2019   Procedure: RIGHT TOTAL HIP ARTHROPLASTY ANTERIOR APPROACH;  Surgeon: Mcarthur Rossetti, MD;  Location: [facility];  Service: Orthopedics;  Laterality: Right;    Current Medications: Current Meds  Medication Sig   aspirin EC 81 MG tablet Take 1 tablet (81 mg total) by mouth daily. Swallow whole.   atorvastatin (LIPITOR) 40 MG tablet Take 1 tablet (40 mg total) by mouth daily at 6 PM.   carvedilol (COREG) 6.25 MG tablet Take 1 tablet (6.25 mg total) by mouth 2 (two) times daily.   dapagliflozin propanediol (FARXIGA) 10 MG TABS tablet TAKE 1 TABLET (10 MG TOTAL) BY MOUTH DAILY BEFORE BREAKFAST.   furosemide (LASIX) 40 MG tablet Take 1 tablet (40 mg total) by mouth daily.   nitroGLYCERIN (NITROSTAT) 0.4 MG SL tablet Place 1 tablet (0.4 mg total) under the tongue every 5 (five) minutes as needed for chest pain. NO more than 3 pills--if you have the need to take 2 pills or more  call MD   sacubitril-valsartan (ENTRESTO) 97-103 MG Take 1 tablet by mouth 2 (two) times daily.   spironolactone (ALDACTONE) 25 MG tablet Take 1 tablet (25 mg total) by mouth daily.     Allergies:   Patient has no known allergies.   Social History   Socioeconomic History   Marital status: Married    Spouse name: Not on file   Number of children: Not on file   Years of education: Not on file   Highest education level: Not on file  Occupational History   Not on file   Tobacco Use   Smoking status: Never   Smokeless tobacco: Never  Vaping Use   Vaping Use: Never used  Substance and Sexual Activity   Alcohol use: No    Alcohol/week: 0.0 standard drinks   Drug use: No   Sexual activity: Yes  Other Topics Concern   Not on file  Social History Narrative   Lives with wife.     Social Determinants of Health   Financial Resource Strain: Not on file  Food Insecurity: Not on file  Transportation Needs: Not on file  Physical Activity: Not on file  Stress: Not on file  Social Connections: Not on file     Family History: The patient's family history includes Diabetes in his mother; Heart disease in his mother.  ROS:   Please see the history of present illness.     All other systems reviewed and are negative.  EKGs/Labs/Other Studies Reviewed:    The following studies were reviewed today:  Bilateral Carotid Doppler 01/27/2021: Essentially stable LICA velocities when compared to the prior exam.   Summary:  Right Carotid: Velocities in the right ICA are consistent with a 40-59% stenosis. Non-hemodynamically significant plaque <50% noted in the CCA.   Left Carotid: Velocities in the left ICA are consistent with a 1-39%  stenosis. Non-hemodynamically significant plaque <50% noted in the CCA.   Vertebrals:  Bilateral vertebral arteries demonstrate antegrade flow.  Subclavians: Bilateral subclavian artery flow was disturbed.  Cardiac MRI 10/14/2020: FINDINGS: The patient is status post sternotomy. Limited images of the lung fields showed no gross abnormalities.   The left ventricle is moderately dilated with normal wall thickness. Basal to mid inferior akinesis, severe hypokinesis of the apex and peri-apical segments. LV EF 33%. No LV thrombus. Moderate right ventricular enlargement with RV EF 30%. Mild mitral regurgitation. Normal left atrial size, mild right atrial enlargement. Mild mitral regurgitation. Trileaflet aortic valve with trivial  aortic insufficiency.   Delayed enhancement:   >50% wall thickness subendocardial late gadolinium enhancement (LGE) in the basal to mid inferior wall.   About 50% wall thickness subendocardial LGE in the mid to apical anterior, anteroseptal, inferoseptal, and anterolateral wall segements.   MEASUREMENTS: MEASUREMENTS LVEDV 300 mL   LVSV 99 mL   LVEF 33%   RVEDV 274 mL RVSV 81 mL RVEF 30%   IMPRESSION: 1. Moderately dilated LV with EF 33%, wall motion abnormalities as noted above.   2.  Moderately dilated RV with EF 30%.   3. Delayed enhancement pattern suggestive of coronary disease (subendocardial LGE).  Echo 04/03/2020:  1. Left ventricular ejection fraction, by estimation, is 35 to 40%. The  left ventricle has moderately decreased function. The left ventricle  demonstrates global hypokinesis. The left ventricular internal cavity size  was mildly dilated. Left ventricular  diastolic parameters are consistent with Grade II diastolic dysfunction  (pseudonormalization).   2. Right ventricular systolic function is moderately reduced. The  right  ventricular size is normal. There is normal pulmonary artery systolic  pressure. The estimated right ventricular systolic pressure is 44.0 mmHg.   3. Left atrial size was moderately dilated.   4. Right atrial size was mildly dilated.   5. The mitral valve is normal in structure. Trivial mitral valve  regurgitation. No evidence of mitral stenosis.   6. The aortic valve is tricuspid. Aortic valve regurgitation is not  visualized. No aortic stenosis is present.   7. The inferior vena cava is normal in size with greater than 50%  respiratory variability, suggesting right atrial pressure of 3 mmHg.   8. Technically difficult study with poor acoustic windows.   11/28/2018 Left Heart Cath: Mid RCA lesion is 100% stenosed. Ost LM to Mid LM lesion is 20% stenosed. 2nd Mrg lesion is 100% stenosed. Mid LAD-1 lesion is 99%  stenosed. Mid LAD-2 lesion is 100% stenosed.   1. The LAD is a large caliber vessel that courses the apex. There is a severe mid stenosis just after the first diagonal branch. The mid vessel is then occluded beyond the third diagonal branch. The distal LAD fills from left to left collaterals.  2. The Circumflex is a large caliber vessel. There is mild diffuse disease. The second obtuse marginal branch appears to be small in caliber and is chronically occluded, filling from left to left collaterals.  4. The RCA is a large dominant vessel with a long segment of chronic occlusion in the mid vessel. The distal vessel fills from right to right collaterals and left to right collaterals.  5. LVEDP 15 mmHg   Recommendations: He has chronic occlusion of the LAD and the RCA. He will need medical management of his CAD for now with ASA, statin and a beta blocker. I will have our CTO team review the films and give [place] direction on the possibility of PCI of the RCA and LAD. If he is not felt to be a candidate for CTO PCI, then we can consider CABG. He has no chest pain. I will let him go home today. I will route this to Dr. Percival Spanish and further planning for revascularization will be discussed as an outpatient.   EKG:   EKG is personally reviewed.  03/22/2021: EKG was not ordered.   Recent Labs: 09/07/2020: B Natriuretic Peptide 214.5 10/12/2020: Hemoglobin 13.2; Platelets 176 01/29/2021: BUN 28; Creatinine, Ser 1.17; Potassium 3.8; Sodium 139   Recent Lipid Panel    Component Value Date/Time   CHOL 115 08/05/2020 0838   CHOL 182 08/03/2018 1024   TRIG 73 08/05/2020 0838   HDL 41 08/05/2020 0838   HDL 53 08/03/2018 1024   CHOLHDL 2.8 08/05/2020 0838   VLDL 15 08/05/2020 0838   LDLCALC 59 08/05/2020 0838   LDLCALC 116 (H) 08/03/2018 1024    Physical Exam:    VS:  BP 134/74    Pulse (!) 59    Ht 6\' 3"  (1.905 m)    Wt 285 lb 11.2 oz (129.6 kg)    SpO2 96%    BMI 35.71 kg/m     Wt Readings from Last 3  Encounters:  03/22/21 285 lb 11.2 oz (129.6 kg)  01/29/21 276 lb 6.4 oz (125.4 kg)  10/07/20 286 lb 3.2 oz (129.8 kg)     GEN: Well nourished, well developed in no acute distress HEENT: Normal NECK: No JVD; No carotid bruits LYMPHATICS: No lymphadenopathy CARDIAC: RRR, no murmurs, rubs, gallops RESPIRATORY:  Clear to auscultation without rales,  wheezing or rhonchi  ABDOMEN: Soft, non-tender, non-distended MUSCULOSKELETAL:  No edema; No deformity  SKIN: Warm and dry NEUROLOGIC:  Alert and oriented x 3 PSYCHIATRIC:  Normal affect       ASSESSMENT:    1. Chronic systolic CHF (congestive heart failure) ([place])   2. Coronary artery disease involving native coronary artery of native heart without angina pectoris   3. Ischemic cardiomyopathy    PLAN:    In order of problems listed above:  #Chronic systolic heart failure #Coronary artery disease #Ischemic cardiomyopathy NYHA class II-III.  Warm and dry on exam.  Persistently reduced left ventricular function.  Continue Entresto, Aldactone, Farxiga, Lasix, Coreg.  The patient has an ischemic CM (EF 33%), NYHA Class II-III CHF, and CAD.  He is referred by Dr Aundra Dubin for risk stratification of sudden death and consideration of ICD implantation.  At this time, he meets criteria for ICD implantation for primary prevention of sudden death.  I have had a thorough discussion with the patient reviewing options.  The patient and their family (if available) have had opportunities to ask questions and have them answered.  Risks, benefits, alternatives to ICD implantation were discussed in detail with the patient today. The patient understands that the risks include but are not limited to bleeding, infection, pneumothorax, perforation, tamponade, vascular damage, renal failure, MI, stroke, death, inappropriate shocks, and lead dislodgement.  The patient would like some time to think about his options and will let us know if he would like to proceed with  scheduling the ICD implant.    Total time spent with patient today 60 minutes. This includes reviewing records, evaluating the patient and coordinating care.  Medication Adjustments/Labs and Tests Ordered: Current medicines are reviewed at length with the patient today.  Concerns regarding medicines are outlined above.  Orders Placed This Encounter  Procedures   EKG 12-Lead   No orders of the defined types were placed in this encounter.   I,Mathew Stumpf,acting as a Education administrator for Vickie Epley, MD.,have documented all relevant documentation on the behalf of Vickie Epley, MD,as directed by  Vickie Epley, MD while in the presence of Vickie Epley, MD.  I, Vickie Epley, MD, have reviewed all documentation for this visit. The documentation on 03/22/21 for the exam, diagnosis, procedures, and orders are all accurate and complete.   Signed, Hilton Cork. Quentin Ore, MD, [facility], [facility] 03/22/2021 8:17 PM    Electrophysiology  [facility]

## 2021-03-22 NOTE — Patient Instructions (Addendum)
Medication Instructions:  Your physician recommends that you continue on your current medications as directed. Please refer to the Current Medication list given to you today. *If you need a refill on your cardiac medications before your next appointment, please call your pharmacy*  Lab Work: None. If you have labs (blood work) drawn today and your tests are completely normal, you will receive your results only by: [place] (if you have MyChart) OR A paper copy in the mail If you have any lab test that is abnormal or we need to change your treatment, we will call you to review the results.  Testing/Procedures: None.  Follow-Up: At [facility], you and your health needs are our priority.  As part of our continuing mission to provide you with exceptional heart care, we have created designated Provider Care Teams.  These Care Teams include your primary Cardiologist (physician) and Advanced Practice Providers (APPs -  Physician Assistants and Nurse Practitioners) who all work together to provide you with the care you need, when you need it.  Your physician wants you to follow-up in: please contact Otila Kluver RN if you wish to move forward with ICD. [phone].  We recommend signing up for the patient portal called "MyChart".  Sign up information is provided on this After Visit Summary.  MyChart is used to connect with patients for Virtual Visits (Telemedicine).  Patients are able to view lab/test results, encounter notes, upcoming appointments, etc.  Non-urgent messages can be sent to your provider as well.   To learn more about what you can do with MyChart, go to [email or web address].    Any Other Special Instructions Will Be Listed Below (If Applicable).

## 2021-03-22 NOTE — Progress Notes (Deleted)
VVI ICD

## 2021-04-09 IMAGING — DX RIGHT KNEE - 1-2 VIEW
3 series · 3 of 3 positions shown · non-contrast
Comparison: None.

CLINICAL DATA: Right knee pain and swelling for 6 months.

EXAM:
RIGHT KNEE - 2 VIEW

[knee ap (1 of 2)]
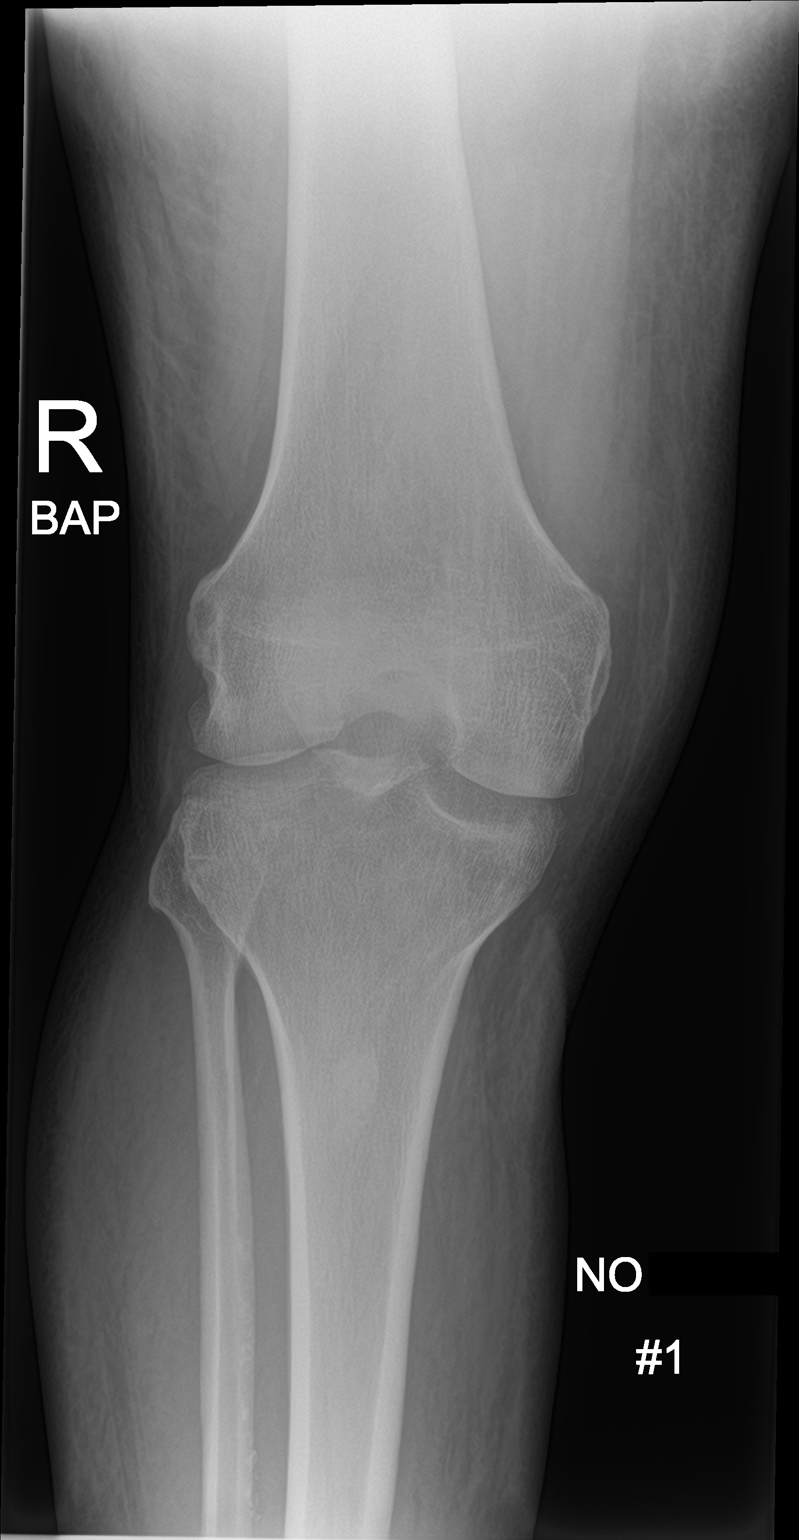

[knee lat]
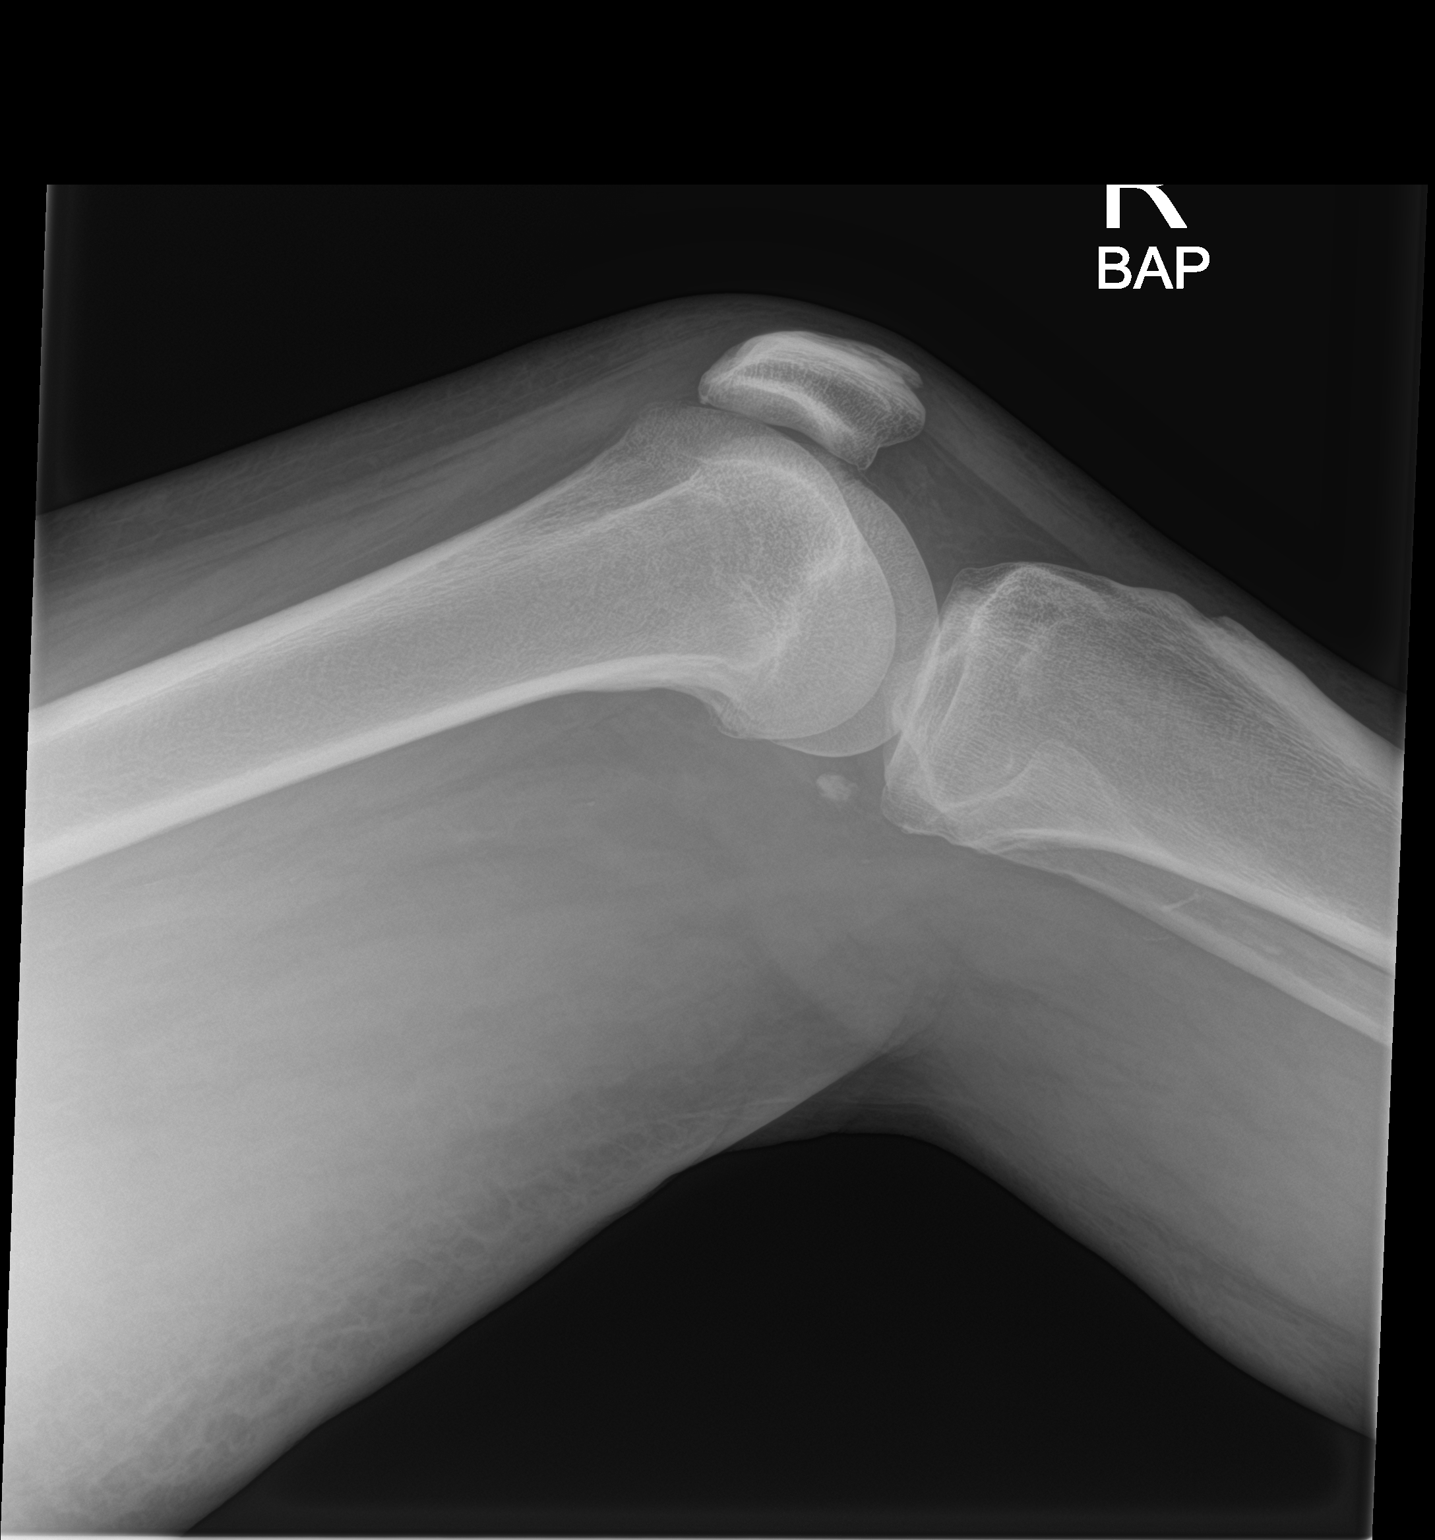

[knee ap (2 of 2)]
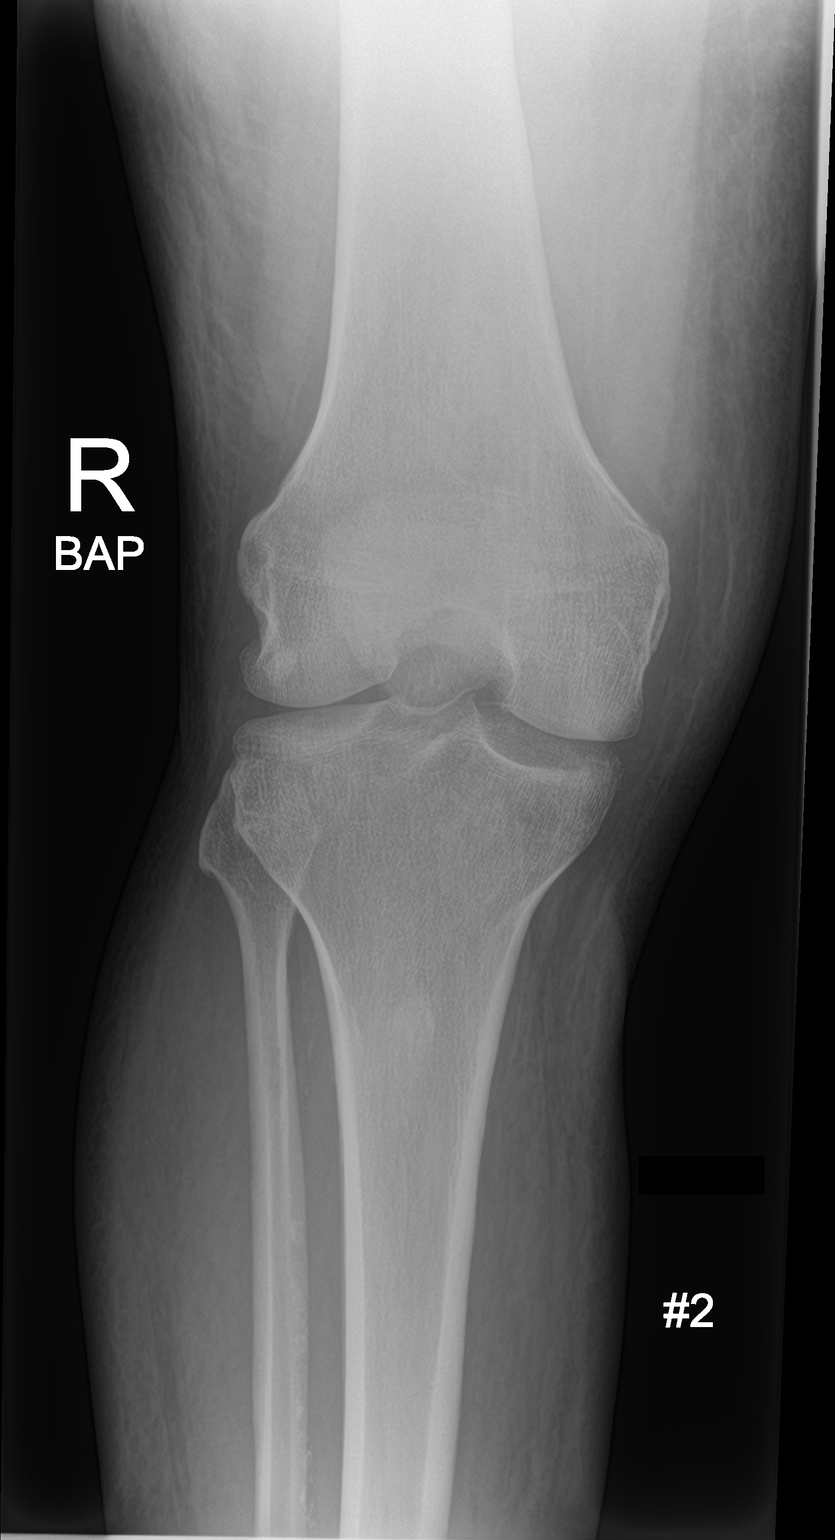

[3 of 3 positions shown; findings below may reference images not displayed]

FINDINGS: No evidence of fracture, dislocation, or joint effusion. No evidence
of arthropathy or other focal bone abnormality. Soft tissues are
unremarkable.
IMPRESSION: Negative.

## 2021-04-23 ENCOUNTER — Other Ambulatory Visit (HOSPITAL_COMMUNITY): Payer: Self-pay

## 2021-04-23 MED ORDER — ENTRESTO 97-103 MG PO TABS: 1.0000 | ORAL_TABLET | Freq: Two times a day (BID) | ORAL | 3 refills | Status: DC

## 2021-04-23 NOTE — Telephone Encounter (Signed)
Advanced Heart Failure Patient Advocate Encounter ? ?Sent in [employer] application via fax. Patient is trying to collect POI in order to send in. Document scanned to chart.  ? ?Will follow up. ? ?

## 2021-04-27 ENCOUNTER — Other Ambulatory Visit (HOSPITAL_COMMUNITY): Payer: Self-pay

## 2021-04-28 ENCOUNTER — Other Ambulatory Visit (HOSPITAL_COMMUNITY): Payer: Self-pay

## 2021-04-29 ENCOUNTER — Other Ambulatory Visit (HOSPITAL_COMMUNITY): Payer: Self-pay

## 2021-04-29 ENCOUNTER — Encounter (HOSPITAL_COMMUNITY): Payer: Self-pay | Admitting: Cardiology

## 2021-04-29 ENCOUNTER — Ambulatory Visit (HOSPITAL_COMMUNITY)
Admission: RE | Admit: 2021-04-29 | Discharge: 2021-04-29 | Disposition: A | Discharge status: Home / Self Care | Payer: Self-pay | Source: Ambulatory Visit | Attending: Cardiology | Admitting: Cardiology

## 2021-04-29 VITALS — BP 122/70 | HR 58 | Wt 286.0 lb

## 2021-04-29 DIAGNOSIS — E785 Hyperlipidemia, unspecified: Secondary | ICD-10-CM

## 2021-04-29 DIAGNOSIS — Z951 Presence of aortocoronary bypass graft: Secondary | ICD-10-CM | POA: Insufficient documentation

## 2021-04-29 DIAGNOSIS — Z7982 Long term (current) use of aspirin: Secondary | ICD-10-CM | POA: Insufficient documentation

## 2021-04-29 DIAGNOSIS — I110 Hypertensive heart disease with heart failure: Secondary | ICD-10-CM | POA: Insufficient documentation

## 2021-04-29 DIAGNOSIS — I959 Hypotension, unspecified: Secondary | ICD-10-CM | POA: Insufficient documentation

## 2021-04-29 DIAGNOSIS — Z79899 Other long term (current) drug therapy: Secondary | ICD-10-CM | POA: Insufficient documentation

## 2021-04-29 DIAGNOSIS — I255 Ischemic cardiomyopathy: Secondary | ICD-10-CM | POA: Insufficient documentation

## 2021-04-29 DIAGNOSIS — I3139 Other pericardial effusion (noninflammatory): Secondary | ICD-10-CM | POA: Insufficient documentation

## 2021-04-29 DIAGNOSIS — I6529 Occlusion and stenosis of unspecified carotid artery: Secondary | ICD-10-CM | POA: Insufficient documentation

## 2021-04-29 DIAGNOSIS — I5022 Chronic systolic (congestive) heart failure: Secondary | ICD-10-CM | POA: Insufficient documentation

## 2021-04-29 DIAGNOSIS — I2510 Atherosclerotic heart disease of native coronary artery without angina pectoris: Secondary | ICD-10-CM | POA: Insufficient documentation

## 2021-04-29 LAB — LIPID PANEL
Cholesterol: 96 mg/dL (ref 0–200)
HDL: 33 mg/dL — ABNORMAL LOW (ref >40)
LDL Cholesterol: 48 mg/dL (ref 0–99)
Total CHOL/HDL Ratio: 2.9 RATIO
Triglycerides: 77 mg/dL (ref <150)
VLDL: 15 mg/dL (ref 0–40)

## 2021-04-29 LAB — BASIC METABOLIC PANEL
Anion gap: 7 (ref 5–15)
BUN: 31 mg/dL — ABNORMAL HIGH (ref 8–23)
CO2: 23 mmol/L (ref 22–32)
Calcium: 8.5 mg/dL — ABNORMAL LOW (ref 8.9–10.3)
Chloride: 111 mmol/L (ref 98–111)
Creatinine, Ser: 1.32 mg/dL — ABNORMAL HIGH (ref 0.61–1.24)
GFR, Estimated: 60 mL/min — ABNORMAL LOW (ref >60)
Glucose, Bld: 141 mg/dL — ABNORMAL HIGH (ref 70–99)
Potassium: 3.9 mmol/L (ref 3.5–5.1)
Sodium: 141 mmol/L (ref 135–145)

## 2021-04-29 LAB — VITAMIN B12: Vitamin B-12: 337 pg/mL (ref 180–914)

## 2021-04-29 MED ORDER — CARVEDILOL 6.25 MG PO TABS
9.3750 mg | ORAL_TABLET | Freq: Two times a day (BID) | ORAL | 3 refills | Status: DC
  Filled 2021-04-29: qty 90, 30d supply, fill #0
  Filled 2021-06-02: qty 90, 30d supply, fill #1
  Filled 2021-07-28: qty 90, 30d supply, fill #2
  Filled 2021-09-13: qty 90, 30d supply, fill #3
  Filled 2021-10-16: qty 90, 30d supply, fill #4
  Filled 2021-11-15: qty 90, 30d supply, fill #5
  Filled 2021-11-23 – 2022-01-27 (×2): qty 90, 30d supply, fill #6
  Filled 2022-03-07: qty 90, 30d supply, fill #7

## 2021-04-29 NOTE — Patient Instructions (Signed)
Medication Changes: ? ?Increase Carvedilol to 9.'375mg'$  Twice daily (1 1/2 Tab) ? ? ?Lab Work: ? ?Labs done today, your results will be available in MyChart, we will contact you for abnormal readings. ? ? ?Testing/Procedures: ? ?none ? ?Referrals: ? ?none ? ?Special Instructions // Education: ? ?none ? ?Follow-Up in: 4 months  ? ?At the [facility], you and your health needs are our priority. We have a designated team specialized in the treatment of Heart Failure. This Care Team includes your primary Heart Failure Specialized Cardiologist (physician), Advanced Practice Providers (APPs- Physician Assistants and Nurse Practitioners), and Pharmacist who all work together to provide you with the care you need, when you need it.  ? ?You may see any of the following providers on your designated Care Team at your next follow up: ? ?Dr Glori Bickers ?Dr [name] ?Darrick Grinder, NP ?Lyda Jester, PA ?Jessica Milford,NP ?Marlyce Huge, PA ?Audry Riles, PharmD ? ? ?Please be sure to bring in all your medications bottles to every appointment.  ? ?Need to Contact us: ? ?If you have any questions or concerns before your next appointment please send [place] a message through [place] or call our office at [phone].   ? ?TO LEAVE A MESSAGE FOR THE NURSE SELECT OPTION 2, PLEASE LEAVE A MESSAGE INCLUDING: ?YOUR NAME ?DATE OF BIRTH ?CALL BACK NUMBER ?REASON FOR CALL**this is important as we prioritize the call backs ? ?YOU WILL RECEIVE A CALL BACK THE SAME DAY AS LONG AS YOU CALL BEFORE 4:00 PM ? ? ?

## 2021-04-29 NOTE — Progress Notes (Signed)
?  ? ?PCP:  [name], NP  ?Cardiologist:  [name], MD ?HF Cardiology: Dr Aundra Dubin  ?  ?History of Present Illness: ?Frank Love is a 66 y.o. male with a history of HTN, LE edema, and elevated BNP who returns for followup of CHF and CAD.  ? ?He initially was seen by PCP in 5/20 with elevated BNP and was started on lasix with cardiology referral, lasix was subsequently increased to 40 mg bid due to ongoing LE edema. He underwent cardiac evaluation for right hip surgery in September with continued symptoms of LE edema on lasix 40 bid. Echo in 8/54 showed systolic HF with EF 62-70%. He was started on entresto, metoprolol at this time.  ? ?He underwent cath 11/20 which showed multivessel disease, had CABGx4 12/12/18.  Repeat echo done post-CABG 12/17/18 showed severely decreased right ventricular systolic function with severe enlargement in addition to small pericardial effusion, otherwise similar to previous. VQ scan was negative for PE. Transferred to CIR for rehab.  ? ?He had right THR done in 3/50 with no complications.  ? ?Echo repeated 03/2019, EF 35-40%, mild LVH, mild LV dilation, moderately decreased RV systolic function, normal RV size.  ? ?Admitted to [facility] 11/19-12/10/21 for COVID-19 viral pneumonia requiring up to 30 L of HFNC.  Received remdesivir x 5 days, steroid taper, tocilizumab/baricitinib. Hospitalization complicated by development of severe orthostatic hypotension and thrombocytopenia. Echo 12/14/19 with EF 45-50%. Heart Failure team consulted for on-going hypotension and bradycardia. Beta-blocker held, midodrine 15 mg tid started, and abdominal binder and TED hose applied. Patient discharged home off all GDMT medications due to hypotension and bradycardia.  ? ?Echo in 3/22 showed EF 35-40%, diffuse hypokinesis, moderately decreased RV systolic function with normal RV size.  Technically difficult study.  cMRI (9/22) showed LVEF 33%, RVEF 30%, subendocardial LGE suggestive of  coronary disease. ? ?He returns for followup of CHF.  He was seen by EP and decided against getting an ICD.  Weight is up today, but he has been eating more and not exercising much.  No dyspnea walking up stairs.  No dyspnea walking on flat ground, can go at least 2 blocks without problems.  No chest pain.  No palpitations.  No orthopnea/PND.   ? ?ECG (personally reviewed): NSR, 1st degree AVB, LAFB   ? ?Labs (2/21): K 4.7, creatinine 1.07, LDL 61 ?Labs (3/21): K 4.3, creatinine 1.05, digoxin 0.9 ?Labs (5/21): K 4.3, creatinine 1.25  ?Labs (12/21): K 4.2 => 3.5, creatinine 1.17 => 0.91, plts 60K ?Labs (2/22): LDL 113, HDL 40 ?Labs (3/22): K 3.9, creatinine 1.12, BNP 272 ?Labs (7/22): LDL 59, HDL 41, K 3.7, creatinine 1.23 ?Labs (1/23): K 3.8, creatinine 1.17 ? ?PMH: ?1. OA: s/p right THR.  ?2. HTN ?3. Carotid stenosis: Carotid dopplers (11/20) with 40-59% RICA stenosis.  ?- Carotid dopplers (12/21)  right ICA 40-59% stenosis, left ICA mild stenosis.  ?- Carotid dopplers (1/23): RICA 40-59% ?4. CAD: LHC in 11/20 with totally occluded OM, totally occluded mid LAD, totally occluded mid RCA.  ?- CABG 11/20 with LIMA-LAD, RIMA-PDA, SVG-OM1, SVG-AM ?5. Chronic systolic CHF: Ischemic cardiomyopathy.  ?- Echo (9/20): EF 30-35%, mildly decreased RV systolic function.  ?- Echo (11/20): EF 30-35%, severely dilated RV with severely decreased systolic function. ?- Echo (3/21): EF 35-40%, mild LV dilation with mild LVH, moderately decreased RV systolic function with normal RV size, PASP 33 mmHg.   ?- Echo (11/21): LVEF improved to 45-50%. RV mildly reduced  ?- Echo (  3/22): EF 35-40%, diffuse hypokinesis, moderately decreased RV systolic function with normal RV size.   ?- cMRI (9/22) showed LVEF 33%, RVEF 30%, subendocardial LGE suggestive of coronary disease. ?6. V/Q scan negative for PE in 11/20.  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  ?8. COVID-19 11/21 ? ?Past Surgical History:  ?Procedure Laterality Date  ? CIRCUMCISION N/A  10/12/2018  ? Procedure: CIRCUMCISION ADULT;  Surgeon: Franchot Gallo, MD;  Location: [facility];  Service: Urology;  Laterality: N/A;  45 mins  ? CO2 LASER APPLICATION N/A 01/29/1094  ? Procedure: CO2 LASER APPLICATION;  Surgeon: Franchot Gallo, MD;  Location: [facility];  Service: Urology;  Laterality: N/A;  30 MINS  ? COLONOSCOPY    ? CORONARY ARTERY BYPASS GRAFT N/A 12/12/2018  ? Procedure: CORONARY ARTERY BYPASS GRAFTING (CABG) x four, using bilateral internal mammary arteries and right leg greater saphenous vein harvested endoscopically;  Surgeon: [name], MD;  Location: [place];  Service: Open Heart Surgery;  Laterality: N/A;  ? LEFT HEART CATH AND CORONARY ANGIOGRAPHY N/A 11/28/2018  ? Procedure: LEFT HEART CATH AND CORONARY ANGIOGRAPHY;  Surgeon: Burnell Blanks, MD;  Location: [place] [facility];  Service: Cardiovascular;  Laterality: N/A;  ? MEDIASTINAL EXPLORATION N/A 12/12/2018  ? Procedure: Mediastinal Re-Exploration after bypass graft;  Surgeon: [name], MD;  Location: [place];  Service: Open Heart Surgery;  Laterality: N/A;  ? TEE WITHOUT CARDIOVERSION N/A 12/12/2018  ? Procedure: TRANSESOPHAGEAL ECHOCARDIOGRAM (TEE);  Surgeon: [name], MD;  Location: [place];  Service: Open Heart Surgery;  Laterality: N/A;  ? TOOTH EXTRACTION    ? TOTAL HIP ARTHROPLASTY Right 03/08/2019  ? Procedure: RIGHT TOTAL HIP ARTHROPLASTY ANTERIOR APPROACH;  Surgeon: Mcarthur Rossetti, MD;  [facility];  Service: Orthopedics;  Laterality: Right;  ? ? ?Current Outpatient Medications  ?Medication Sig Dispense Refill  ? aspirin EC 81 MG tablet Take 1 tablet (81 mg total) by mouth daily. Swallow whole. 90 tablet 3  ? atorvastatin (LIPITOR) 40 MG tablet Take 1 tablet (40 mg total) by mouth daily at 6 PM. 30 tablet 3  ? dapagliflozin propanediol (FARXIGA) 10 MG TABS tablet TAKE 1 TABLET (10 MG TOTAL) BY MOUTH DAILY BEFORE BREAKFAST. 30 tablet 3  ? furosemide (LASIX) 40 MG tablet Take 1 tablet  (40 mg total) by mouth daily. 30 tablet 3  ? nitroGLYCERIN (NITROSTAT) 0.4 MG SL tablet Place 1 tablet (0.4 mg total) under the tongue every 5 (five) minutes as needed for chest pain. NO more than 3 pills--if you have the need to take 2 pills or more call MD 30 tablet 3  ? sacubitril-valsartan (ENTRESTO) 97-103 MG Take 1 tablet by mouth 2 (two) times daily. 180 tablet 3  ? spironolactone (ALDACTONE) 25 MG tablet Take 1 tablet (25 mg total) by mouth daily. 30 tablet 3  ? carvedilol (COREG) 6.25 MG tablet Take 1.5 tablets (9.375 mg total) by mouth 2 (two) times daily. 180 tablet 3  ? ?No current facility-administered medications for this encounter.  ? ? ?Allergies:   Patient has no known allergies.  ? ?Social History:  The patient  reports that he has never smoked. He has never used smokeless tobacco. He reports that he does not drink alcohol and does not use drugs.  ? ?Family History:  The patient's family history includes Diabetes in his mother; Heart disease in his mother.  ? ?ROS:  Please see the history of present illness.   All other systems are personally reviewed  and negative.  ? ?Exam:  ?BP 122/70   Pulse (!) 58   Wt 129.7 kg (286 lb)   SpO2 95%   BMI 35.75 kg/m?   ? ?Physical Exam  ?General: NAD ?Neck: No JVD, no thyromegaly or thyroid nodule.  ?Lungs: Clear to auscultation bilaterally with normal respiratory effort. ?CV: Nondisplaced PMI.  Heart regular S1/S2, no S3/S4, no murmur.  1+ ankle edema.  No carotid bruit.  Normal pedal pulses.  ?Abdomen: Soft, nontender, no hepatosplenomegaly, no distention.  ?Skin: Intact without lesions or rashes.  ?Neurologic: Alert and oriented x 3.  ?Psych: Normal affect. ?Extremities: No clubbing or cyanosis.  ?HEENT: Normal.  ? ?Recent Labs: ?09/07/2020: B Natriuretic Peptide 214.5 ?10/12/2020: Hemoglobin 13.2; Platelets 176 ?04/29/2021: BUN 31; Creatinine, Ser 1.32; Potassium 3.9; Sodium 141  ?Personally reviewed  ? ?Wt Readings from Last 3 Encounters:  ?04/29/21 129.7 kg  (286 lb)  ?03/22/21 129.6 kg (285 lb 11.2 oz)  ?01/29/21 125.4 kg (276 lb 6.4 oz)  ? ? ?ASSESSMENT AND PLAN: ? ?1. CAD: S/p CABG in 11/20. No chest pain.  ?- Continue ASA 81 mg daily.  ?- Continue atorvastatin, che

## 2021-05-07 ENCOUNTER — Ambulatory Visit: Payer: Self-pay | Attending: Nurse Practitioner | Admitting: Nurse Practitioner

## 2021-05-07 ENCOUNTER — Encounter: Payer: Self-pay | Admitting: Nurse Practitioner

## 2021-05-07 VITALS — BP 121/75 | HR 56 | Wt 283.8 lb

## 2021-05-07 DIAGNOSIS — Z1211 Encounter for screening for malignant neoplasm of colon: Secondary | ICD-10-CM

## 2021-05-07 DIAGNOSIS — R7303 Prediabetes: Secondary | ICD-10-CM

## 2021-05-07 DIAGNOSIS — I10 Essential (primary) hypertension: Secondary | ICD-10-CM

## 2021-05-07 DIAGNOSIS — Z1159 Encounter for screening for other viral diseases: Secondary | ICD-10-CM

## 2021-05-07 LAB — POCT GLYCOSYLATED HEMOGLOBIN (HGB A1C): HbA1c, POC (prediabetic range): 5.8 % (ref 5.7–6.4)

## 2021-05-07 NOTE — Addendum Note (Signed)
Addended by: Leamon Arnt I on: 05/07/2021 09:00 AM ? ? Modules accepted: Orders ? ?

## 2021-05-07 NOTE — Progress Notes (Signed)
? ?Assessment & Plan:  ?Frank Love was seen today for hypertension. ? ?Diagnoses and all orders for this visit: ? ?Primary hypertension ?Continue all blood pressure medications as prescribed ?Follow Dash or Mediterranean diet for low sodium options ?Bring blood pressure log to each office visit ? ?Need for hepatitis C screening test ?-     HCV Ab w Reflex to Quant PCR ? ?Colon cancer screening ?-     Fecal occult blood, imunochemical([facility]) ? ?Prediabetes ?-     Hemoglobin A1c ?Well-controlled.  Continue Farxiga 10 mg daily as prescribed ? ? ?Patient has been counseled on age-appropriate routine health concerns for screening and prevention. These are reviewed and up-to-date. Referrals have been placed accordingly. Immunizations are up-to-date or declined.    ?Subjective:  ? ?Chief Complaint  ?Patient presents with  ? Hypertension  ? ?HPI ?Frank Love 66 y.o. male presents to office today for follow up to HTN.  ?He also has a PMH of BLE edema, CHF, CAD (he has been followed by the heart failure clinic and cardiology) ? ?Doing well today. Moved to the 3rd floor of his residence and states he has no difficulty with stair climbing. ? ?He declines all vaccines today. ? ?He was sent home with a stool kit for colon cancer screening today. ? ? ?HTN ?Blood pressure is well controlled. He is currently taking carvedilol 9.375 mg twice daily, furosemide 40 mg daily, Entresto 97-103 mg twice daily, spironolactone 25 mg daily. ?BP Readings from Last 3 Encounters:  ?05/07/21 121/75  ?04/29/21 122/70  ?03/22/21 134/74  ?   ?Prediabetes ?Well-controlled.  He is taking [place] as well 10 mg daily ?Lab Results  ?Component Value Date  ? HGBA1C 5.8 09/16/2020  ?  ?ROS ? ?Past Medical History:  ?Diagnosis Date  ? 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 CHF (congestive heart failure) ([place])   ? Chronic pain of right knee   ? Coronary artery disease   ? Hypertension   ? Phimosis   ? ? ?Past Surgical History:  ?Procedure Laterality  Date  ? CIRCUMCISION N/A 10/12/2018  ? Procedure: CIRCUMCISION ADULT;  Surgeon: Franchot Gallo, MD;  Location: [facility];  Service: Urology;  Laterality: N/A;  45 mins  ? CO2 LASER APPLICATION N/A 03/31/8586  ? Procedure: CO2 LASER APPLICATION;  Surgeon: Franchot Gallo, MD;  [facility];  Service: Urology;  Laterality: N/A;  30 MINS  ? COLONOSCOPY    ? CORONARY ARTERY BYPASS GRAFT N/A 12/12/2018  ? Procedure: CORONARY ARTERY BYPASS GRAFTING (CABG) x four, using bilateral internal mammary arteries and right leg greater saphenous vein harvested endoscopically;  Surgeon: [name], MD;  Location: [place];  Service: Open Heart Surgery;  Laterality: N/A;  ? LEFT HEART CATH AND CORONARY ANGIOGRAPHY N/A 11/28/2018  ? Procedure: LEFT HEART CATH AND CORONARY ANGIOGRAPHY;  Surgeon: Burnell Blanks, MD;  Location: [place] [facility];  Service: Cardiovascular;  Laterality: N/A;  ? MEDIASTINAL EXPLORATION N/A 12/12/2018  ? Procedure: Mediastinal Re-Exploration after bypass graft;  Surgeon: [name], MD;  Location: [place];  Service: Open Heart Surgery;  Laterality: N/A;  ? TEE WITHOUT CARDIOVERSION N/A 12/12/2018  ? Procedure: TRANSESOPHAGEAL ECHOCARDIOGRAM (TEE);  Surgeon: [name], MD;  Location: [place];  Service: Open Heart Surgery;  Laterality: N/A;  ? TOOTH EXTRACTION    ? TOTAL HIP ARTHROPLASTY Right 03/08/2019  ? Procedure: RIGHT TOTAL HIP ARTHROPLASTY ANTERIOR APPROACH;  Surgeon: Mcarthur Rossetti, MD;  [facility];  Service: Orthopedics;  Laterality: Right;  ? ? ?Family History  ?Problem Relation Age of Onset  ? Diabetes Mother   ? Heart disease Mother   ? ? ?Social History Reviewed with no changes to be made today.  ? ?Outpatient Medications Prior to Visit  ?Medication Sig Dispense Refill  ? aspirin EC 81 MG tablet Take 1 tablet (81 mg total) by mouth daily. Swallow whole. 90 tablet 3  ? atorvastatin (LIPITOR) 40 MG tablet Take 1 tablet (40 mg total) by mouth daily at 6 PM.  30 tablet 3  ? carvedilol (COREG) 6.25 MG tablet Take 1.5 tablets (9.375 mg total) by mouth 2 (two) times daily. 180 tablet 3  ? dapagliflozin propanediol (FARXIGA) 10 MG TABS tablet TAKE 1 TABLET (10 MG TOTAL) BY MOUTH DAILY BEFORE BREAKFAST. 30 tablet 3  ? furosemide (LASIX) 40 MG tablet Take 1 tablet (40 mg total) by mouth daily. 30 tablet 3  ? nitroGLYCERIN (NITROSTAT) 0.4 MG SL tablet Place 1 tablet (0.4 mg total) under the tongue every 5 (five) minutes as needed for chest pain. NO more than 3 pills--if you have the need to take 2 pills or more call MD 30 tablet 3  ? sacubitril-valsartan (ENTRESTO) 97-103 MG Take 1 tablet by mouth 2 (two) times daily. 180 tablet 3  ? spironolactone (ALDACTONE) 25 MG tablet Take 1 tablet (25 mg total) by mouth daily. 30 tablet 3  ? ?No facility-administered medications prior to visit.  ? ? ?No Known Allergies ? ?   ?Objective:  ?  ?BP 121/75   Pulse (!) 56   Wt 283 lb 12.8 oz (128.7 kg)   SpO2 95%   BMI 35.47 kg/m?  ?Wt Readings from Last 3 Encounters:  ?05/07/21 283 lb 12.8 oz (128.7 kg)  ?04/29/21 286 lb (129.7 kg)  ?03/22/21 285 lb 11.2 oz (129.6 kg)  ? ? ?Physical Exam ? ? ? ?   ?Patient has been counseled extensively about nutrition and exercise as well as the importance of adherence with medications and regular follow-up. The patient was given clear instructions to go to ER or return to medical center if symptoms don't improve, worsen or new problems develop. The patient verbalized understanding.  ? ?Follow-up: Return in about 6 months (around 11/06/2021).  ? ?[name], FNP-BC ?[place] ?[place], [place] ?[phone]   ?05/07/2021, 8:56 AM ?

## 2021-06-02 ENCOUNTER — Other Ambulatory Visit (HOSPITAL_COMMUNITY): Payer: Self-pay

## 2021-06-03 ENCOUNTER — Other Ambulatory Visit (HOSPITAL_COMMUNITY): Payer: Self-pay

## 2021-06-03 MED ORDER — DAPAGLIFLOZIN PROPANEDIOL 10 MG PO TABS: ORAL_TABLET | Freq: Every day | ORAL | 5 refills | Status: DC

## 2021-06-11 ENCOUNTER — Telehealth (HOSPITAL_COMMUNITY): Payer: Self-pay | Admitting: Pharmacy Technician

## 2021-06-11 NOTE — Telephone Encounter (Signed)
Advanced Heart Failure Patient Advocate Encounter  Patient called stating that he was able to renew [place] assistance through [employer] over the phone. Looks like RX was sent on 06/03/21. Confirmed that the patient was approved for assistance from 06/03/21-01/23/22.  Charlann Boxer, CPhT

## 2021-06-11 NOTE — Telephone Encounter (Signed)
Advanced Heart Failure Patient Advocate Encounter  Spoke with patient. Reminded him of POI requirement for [employer] assistance renewal. Patient has about a month on hand at this time, will try to remember and bring POI to the office next week.  Charlann Boxer, CPhT

## 2021-06-22 ENCOUNTER — Telehealth (HOSPITAL_COMMUNITY): Payer: Self-pay | Admitting: Pharmacist

## 2021-06-22 NOTE — Telephone Encounter (Signed)
Medication Samples have been provided to the patient. Patient is approved to receive the medication from [employer] patient assistance, but they sent the medication to his old address. Providing samples until the new shipment arrives (~10 days).   Drug name: Wilder Glade     Strength: 10 mg      Qty: 2 bottles                 LOT: [id number] Exp.Date: 11/24/23   Dosing instructions: take 1 tablet daily      Audry Riles, PharmD, BCPS, CPP Heart Failure Clinic Pharmacist [phone]

## 2021-06-24 NOTE — Telephone Encounter (Signed)
Sent POI to [employer]. Patient provided 2022 social security award letter. Advised patient that [employer] may not accept the 2022 income.  Will follow up.

## 2021-06-28 NOTE — Telephone Encounter (Signed)
Patient called and wanted to know if refill was sent to [employer]. The patient's 2022 income was sent in. Advised the patient to call [employer] and request a status update to confirm that the income provided was sufficient.

## 2021-06-30 ENCOUNTER — Telehealth (HOSPITAL_COMMUNITY): Payer: Self-pay | Admitting: Pharmacy Technician

## 2021-06-30 NOTE — Telephone Encounter (Signed)
Opened in error

## 2021-06-30 NOTE — Telephone Encounter (Signed)
Medication Samples have been provided to the patient.  Drug name: [name]      Strength: 49-'51mg'$         Qty: 2 bottles  LOT: [id number]  Exp.Date: 06/2023  Dosing instructions: Take 2 tablets twice daily.   The patient has been instructed regarding the correct time, dose, and frequency of taking this medication, including desired effects and most common side effects.   Hector Shade [facility] 3:16 PM 06/30/2021

## 2021-06-30 NOTE — Telephone Encounter (Signed)
Updated POI sent via [employer].

## 2021-07-01 ENCOUNTER — Telehealth (HOSPITAL_COMMUNITY): Payer: Self-pay | Admitting: Pharmacy Technician

## 2021-07-01 NOTE — Telephone Encounter (Signed)
Advanced Heart Failure Patient Advocate Encounter  Called [employer] to confirm if updated POI was received. Have yet to receive. Can take 3 business days to receive. Will follow up.

## 2021-07-05 ENCOUNTER — Other Ambulatory Visit (HOSPITAL_COMMUNITY): Payer: Self-pay

## 2021-07-14 NOTE — Telephone Encounter (Signed)
Patient also needed to send in spouse's income. This was sent via efax.

## 2021-07-15 ENCOUNTER — Encounter (HOSPITAL_COMMUNITY): Payer: Self-pay

## 2021-07-15 NOTE — Telephone Encounter (Signed)
Patient called [employer] for a status update and was told his account could not be found. I called [employer]. Representative confirmed patient's account. It can take a few business days for an incoming fax to be processed to the patient's account. Updated patient. Will call back next week to see if the patient's POI was received.  Waylan Boga (RN) a request for samples.

## 2021-07-15 NOTE — Progress Notes (Unsigned)
Medication Samples have been provided to the patient.  Drug name: [name]       Strength: 49/51 mg        Qty: 4  LOT: [id number]  Exp.Date: 07/2023  Dosing instructions: Take 2 tablets Twice daily    The patient has been instructed regarding the correct time, dose, and frequency of taking this medication, including desired effects and most common side effects.   Frank Love Gavriella Hearst 3:32 PM 07/15/2021

## 2021-07-23 NOTE — Telephone Encounter (Signed)
Called [employer] for a status update. The representative stated they did not receive the patient's wife's income that was efaxed despite successful confirmation. Resent via efax. Called and updated the patient.

## 2021-07-28 ENCOUNTER — Other Ambulatory Visit (HOSPITAL_COMMUNITY): Payer: Self-pay

## 2021-07-28 ENCOUNTER — Other Ambulatory Visit (HOSPITAL_COMMUNITY): Payer: Self-pay | Admitting: Cardiology

## 2021-07-28 MED FILL — Spironolactone Tab 25 MG: ORAL | 30 days supply | Qty: 30 | Fill #0 | Status: CN

## 2021-07-29 ENCOUNTER — Other Ambulatory Visit (HOSPITAL_COMMUNITY): Payer: Self-pay

## 2021-07-30 ENCOUNTER — Other Ambulatory Visit (HOSPITAL_COMMUNITY): Payer: Self-pay

## 2021-08-02 ENCOUNTER — Other Ambulatory Visit (HOSPITAL_COMMUNITY): Payer: Self-pay

## 2021-08-02 MED FILL — Spironolactone Tab 25 MG: ORAL | 30 days supply | Qty: 30 | Fill #0 | Status: AC

## 2021-08-08 ENCOUNTER — Other Ambulatory Visit (HOSPITAL_COMMUNITY): Payer: Self-pay

## 2021-08-08 ENCOUNTER — Other Ambulatory Visit (HOSPITAL_COMMUNITY): Payer: Self-pay | Admitting: Cardiology

## 2021-08-09 ENCOUNTER — Other Ambulatory Visit (HOSPITAL_COMMUNITY): Payer: Self-pay

## 2021-08-09 MED FILL — Atorvastatin Calcium Tab 40 MG (Base Equivalent): ORAL | 30 days supply | Qty: 30 | Fill #0 | Status: AC

## 2021-08-09 MED FILL — Spironolactone Tab 25 MG: ORAL | 30 days supply | Qty: 30 | Fill #1 | Status: AC

## 2021-08-10 ENCOUNTER — Other Ambulatory Visit (HOSPITAL_COMMUNITY): Payer: Self-pay

## 2021-08-10 NOTE — Telephone Encounter (Signed)
Advanced Heart Failure Patient Advocate [employer] for a status update. Representative stated that they have received the efax but only the cover page. Sent physical fax of wife's income. Will also mail physical copy. Called and updated patient.

## 2021-08-11 NOTE — Telephone Encounter (Signed)
Advanced Heart Failure Patient Advocate Encounter   Patient was approved to receive Entresto from [employer]  Effective dates: 08/11/21 through 01/23/22  Document scanned to chart.   Called and spoke with the patient. States he will be out of samples on Friday. [employer] will typically expedite in those cases. Advised the patient to call back if it will not be delivered by Saturday. Will provide samples. Patient appreciative.   Charlann Boxer, CPhT

## 2021-08-13 ENCOUNTER — Telehealth (HOSPITAL_COMMUNITY): Payer: Self-pay | Admitting: Pharmacy Technician

## 2021-08-13 NOTE — Telephone Encounter (Signed)
Advanced Heart Failure Patient Advocate Encounter  Medication Samples have been provided to the patient.  Drug name: [name]       Strength: 49-'51mg'$         Qty: 2 bottles       LOT: [id number]              Exp.Date: 07/2023    Dosing instructions: Take 2 tablets by mouth twice daily  The patient has been instructed regarding the correct time, dose, and frequency of taking this medication, including desired effects and most common side effects.   Hector Shade [facility] 10:05 AM 08/13/2021

## 2021-08-23 ENCOUNTER — Other Ambulatory Visit (HOSPITAL_COMMUNITY): Payer: Self-pay

## 2021-08-23 MED ORDER — DAPAGLIFLOZIN PROPANEDIOL 10 MG PO TABS: ORAL_TABLET | Freq: Every day | ORAL | 5 refills | Status: DC

## 2021-09-01 ENCOUNTER — Other Ambulatory Visit (HOSPITAL_COMMUNITY): Payer: Self-pay

## 2021-09-01 ENCOUNTER — Other Ambulatory Visit (HOSPITAL_COMMUNITY): Payer: Self-pay | Admitting: Cardiology

## 2021-09-01 MED FILL — Furosemide Tab 40 MG: ORAL | 30 days supply | Qty: 30 | Fill #0 | Status: AC

## 2021-09-02 ENCOUNTER — Encounter (HOSPITAL_COMMUNITY): Payer: Self-pay

## 2021-09-13 ENCOUNTER — Other Ambulatory Visit (HOSPITAL_COMMUNITY): Payer: Self-pay

## 2021-09-14 ENCOUNTER — Other Ambulatory Visit (HOSPITAL_COMMUNITY): Payer: Self-pay

## 2021-09-14 MED FILL — Furosemide Tab 40 MG: ORAL | 30 days supply | Qty: 30 | Fill #1 | Status: AC

## 2021-09-16 MED FILL — Atorvastatin Calcium Tab 40 MG (Base Equivalent): ORAL | 30 days supply | Qty: 30 | Fill #1 | Status: AC

## 2021-09-17 ENCOUNTER — Other Ambulatory Visit (HOSPITAL_COMMUNITY): Payer: Self-pay

## 2021-09-17 MED FILL — Spironolactone Tab 25 MG: ORAL | 30 days supply | Qty: 30 | Fill #2 | Status: AC

## 2021-09-29 NOTE — Progress Notes (Signed)
PCP:  [name], NP  Cardiologist:  [name], MD HF Cardiology: Dr Aundra Dubin    HPI: Frank Love is a 66 y.o. male with a history of HTN, LE edema, and elevated BNP who returns for followup of CHF and CAD.   He initially was seen by PCP in 5/20 with elevated BNP and was started on lasix with cardiology referral, lasix was subsequently increased to 40 mg bid due to ongoing LE edema. He underwent cardiac evaluation for right hip surgery in September with continued symptoms of LE edema on lasix 40 bid. Echo in 5/42 showed systolic HF with EF 70-62%. He was started on entresto, metoprolol at this time.   He underwent cath 11/20 which showed multivessel disease, had CABGx4 12/12/18.  Repeat echo done post-CABG 12/17/18 showed severely decreased right ventricular systolic function with severe enlargement in addition to small pericardial effusion, otherwise similar to previous. VQ scan was negative for PE. Transferred to CIR for rehab.   He had right THR done in 3/76 with no complications.   Echo repeated 03/2019, EF 35-40%, mild LVH, mild LV dilation, moderately decreased RV systolic function, normal RV size.   Admitted to [facility] 11/19-12/10/21 for COVID-19 viral pneumonia requiring up to 30 L of HFNC.  Received remdesivir x 5 days, steroid taper, tocilizumab/baricitinib. Hospitalization complicated by development of severe orthostatic hypotension and thrombocytopenia. Echo 12/14/19 with EF 45-50%. Heart Failure team consulted for on-going hypotension and bradycardia. Beta-blocker held, midodrine 15 mg tid started, and abdominal binder and TED hose applied. Patient discharged home off all GDMT medications due to hypotension and bradycardia.   Echo in 3/22 showed EF 35-40%, diffuse hypokinesis, moderately decreased RV systolic function with normal RV size.  Technically difficult study.  cMRI (9/22) showed LVEF 33%, RVEF 30%, subendocardial LGE suggestive of coronary disease.  Today he  returns for HF follow up. Overall feeling fine. He lives on the 3rd floor apartment and can walk up 3 flights of stairs carrying his groceries without dyspnea. He has some LE swelling. Denies palpitations, CP, dizziness, abnormal bleeding or PND/Orthopnea. Appetite ok. No fever or chills. Weight at home 295 pounds. Taking all medications. He has seen EP and decided against ICD.  ECG (personally reviewed): none ordered today.   Labs (2/21): K 4.7, creatinine 1.07, LDL 61 Labs (3/21): K 4.3, creatinine 1.05, digoxin 0.9 Labs (5/21): K 4.3, creatinine 1.25  Labs (12/21): K 4.2 => 3.5, creatinine 1.17 => 0.91, plts 60K Labs (2/22): LDL 113, HDL 40 Labs (3/22): K 3.9, creatinine 1.12, BNP 272 Labs (7/22): LDL 59, HDL 41, K 3.7, creatinine 1.23 Labs (1/23): K 3.8, creatinine 1.17 Labs (4/23): K 3.9, creatinine 1.32, LDL 48, HDL 33  PMH: 1. OA: s/p right THR.  2. HTN 3. Carotid stenosis: Carotid dopplers (11/20) with 40-59% RICA stenosis.  - Carotid dopplers (12/21)  right ICA 40-59% stenosis, left ICA mild stenosis.  - Carotid dopplers (1/23): RICA 40-59% 4. CAD: LHC in 11/20 with totally occluded OM, totally occluded mid LAD, totally occluded mid RCA.  - CABG 11/20 with LIMA-LAD, RIMA-PDA, SVG-OM1, SVG-AM 5. Chronic systolic CHF: Ischemic cardiomyopathy.  - Echo (9/20): EF 30-35%, mildly decreased RV systolic function.  - Echo (11/20): EF 30-35%, severely dilated RV with severely decreased systolic function. - Echo (3/21): EF 35-40%, mild LV dilation with mild LVH, moderately decreased RV systolic function with normal RV size, PASP 33 mmHg.   - Echo (11/21): LVEF improved to 45-50%. RV mildly reduced  -  Echo (3/22): EF 35-40%, diffuse hypokinesis, moderately decreased RV systolic function with normal RV size.   - cMRI (9/22) showed LVEF 33%, RVEF 30%, subendocardial LGE suggestive of coronary disease. 6. V/Q scan negative for PE in 11/20.  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  8. COVID-19  11/21  Past Surgical History:  Procedure Laterality Date   CIRCUMCISION N/A 10/12/2018   Procedure: CIRCUMCISION ADULT;  Surgeon: Franchot Gallo, MD;  Location: [facility];  Service: Urology;  Laterality: N/A;  45 mins   CO2 LASER APPLICATION N/A 04/24/6604   Procedure: CO2 LASER APPLICATION;  Surgeon: Franchot Gallo, MD;  Location: [facility];  Service: Urology;  Laterality: N/A;  30 MINS   COLONOSCOPY     CORONARY ARTERY BYPASS GRAFT N/A 12/12/2018   Procedure: CORONARY ARTERY BYPASS GRAFTING (CABG) x four, using bilateral internal mammary arteries and right leg greater saphenous vein harvested endoscopically;  Surgeon: [name], MD;  Location: [place];  Service: Open Heart Surgery;  Laterality: N/A;   LEFT HEART CATH AND CORONARY ANGIOGRAPHY N/A 11/28/2018   Procedure: LEFT HEART CATH AND CORONARY ANGIOGRAPHY;  Surgeon: Burnell Blanks, MD;  Location: [place] [facility];  Service: Cardiovascular;  Laterality: N/A;   MEDIASTINAL EXPLORATION N/A 12/12/2018   Procedure: Mediastinal Re-Exploration after bypass graft;  Surgeon: [name], MD;  Location: [place];  Service: Open Heart Surgery;  Laterality: N/A;   TEE WITHOUT CARDIOVERSION N/A 12/12/2018   Procedure: TRANSESOPHAGEAL ECHOCARDIOGRAM (TEE);  Surgeon: [name], MD;  Location: [place];  Service: Open Heart Surgery;  Laterality: N/A;   TOOTH EXTRACTION     TOTAL HIP ARTHROPLASTY Right 03/08/2019   Procedure: RIGHT TOTAL HIP ARTHROPLASTY ANTERIOR APPROACH;  Surgeon: Mcarthur Rossetti, MD;  Location: [facility];  Service: Orthopedics;  Laterality: Right;   Current Outpatient Medications  Medication Sig Dispense Refill   aspirin EC 81 MG tablet Take 1 tablet (81 mg total) by mouth daily. Swallow whole. 90 tablet 3   atorvastatin (LIPITOR) 40 MG tablet Take 1 tablet (40 mg total) by mouth daily at 6 PM. 90 tablet 30   carvedilol (COREG) 6.25 MG tablet Take 1.5 tablets (9.375 mg total) by mouth 2 (two) times daily.  180 tablet 3   dapagliflozin propanediol (FARXIGA) 10 MG TABS tablet TAKE 1 TABLET (10 MG TOTAL) BY MOUTH DAILY BEFORE BREAKFAST. 30 tablet 5   furosemide (LASIX) 40 MG tablet Take 1 tablet (40 mg total) by mouth daily. 30 tablet 3   sacubitril-valsartan (ENTRESTO) 97-103 MG Take 1 tablet by mouth 2 (two) times daily. 180 tablet 3   spironolactone (ALDACTONE) 25 MG tablet Take 1 tablet (25 mg total) by mouth daily. 30 tablet 6   nitroGLYCERIN (NITROSTAT) 0.4 MG SL tablet Place 1 tablet (0.4 mg total) under the tongue every 5 (five) minutes as needed for chest pain. NO more than 3 pills--if you have the need to take 2 pills or more call MD (Patient not taking: Reported on 09/30/2021) 30 tablet 3   No current facility-administered medications for this encounter.   Allergies:   Patient has no known allergies.   Social History:  The patient  reports that he has never smoked. He has never used smokeless tobacco. He reports that he does not drink alcohol and does not use drugs.   Family History:  The patient's family history includes Diabetes in his mother; Heart disease in his mother.   ROS:  Please see the history of present illness.   All  other systems are personally reviewed and negative.   Exam:  BP 138/74   Pulse 60   Wt 133.2 kg (293 lb 9.6 oz)   SpO2 94%   BMI 36.70 kg/m    Physical Exam  General:  NAD. No resp difficulty, walked into clinic HEENT: Normal Neck: Supple. No JVD. Carotids 2+ bilat; no bruits. No lymphadenopathy or thryomegaly appreciated. Cor: PMI nondisplaced. Regular rate & rhythm. No rubs, gallops or murmurs. Lungs: Faint crackles RLL Abdomen: Obese, nontender, nondistended. No hepatosplenomegaly. No bruits or masses. Good bowel sounds. Extremities: No cyanosis, clubbing, rash, bilateral pedal edema Neuro: Alert & oriented x 3, cranial nerves grossly intact. Moves all 4 extremities w/o difficulty. Affect pleasant.  Recent Labs: 10/12/2020: Hemoglobin 13.2;  Platelets 176 04/29/2021: BUN 31; Creatinine, Ser 1.32; Potassium 3.9; Sodium 141  Personally reviewed   Wt Readings from Last 3 Encounters:  09/30/21 133.2 kg (293 lb 9.6 oz)  05/07/21 128.7 kg (283 lb 12.8 oz)  04/29/21 129.7 kg (286 lb)   Assessment/Plan 1. CAD: S/p CABG in 11/20. No chest pain.  - Continue ASA 81 mg daily.  - Continue atorvastatin, good lipids 4/23. 2. Chronic systolic CHF: Ischemic cardiomyopathy.  Pre-CABG echo with EF 30-35%, mild RV dysfunction.  Post-CABG echo (11/20) with EF 30-35%, severe RV systolic dysfunction.  V/Q scan done, no evidence for PE. Hypotensive post-CABG requiring pressors and then midodrine, but improved over time.  Echo repeated 3/21, EF 35-40% with moderate RV dysfunction, GIIDD. Cardiac MRI was recommended to quantify EF more exactly for purposes of ICD determination, however pt was adamant that he did not want an ICD and declined cMRI. Echo repeated 11/21 LVEF improved to 45-50%, RV mildly reduced. Echo in 3/22 was a technically difficult study, LV EF around 35-40% with moderate RV dysfunction. Cardiac MRI in 9/22 showed showed LVEF 33%, RVEF 30%, subendocardial LGE suggestive of coronary disease.  NYHA class I-II symptoms.  He is not volume overloaded on exam, weight is up. - Advised he wear his compression hose. - Continue Lasix 40 mg daily. BMET/BNP today.  - Continue Farxiga 10 mg daily.  - Continue spironolactone 25 mg daily.    - Continue Entresto 97/103 mg bid.   - Continue Coreg 9.375 mg bid.  - EF is in range for ICD, narrow QRS so not CRT candidate. He is not interested in getting ICD (has seen EP).  3. Carotid stenosis: Carotid dopplers (1/23) right ICA 40-59% stenosis, left ICA are consistent with a 1-39% stenosis.  - Repeat in 1 year. - Continue ASA and statin.   Follow up 4 months with Dr. Aundra Dubin.  Signed, Rafael Bihari, FNP  09/30/2021   [facility] [address] [facility]  [place] [address] [phone] (office) [phone] (fax)

## 2021-09-30 ENCOUNTER — Ambulatory Visit (HOSPITAL_COMMUNITY)
Admission: RE | Admit: 2021-09-30 | Discharge: 2021-09-30 | Disposition: A | Discharge status: Home / Self Care | Payer: Self-pay | Source: Ambulatory Visit | Attending: Family Medicine | Admitting: Family Medicine

## 2021-09-30 ENCOUNTER — Encounter (HOSPITAL_COMMUNITY): Payer: Self-pay

## 2021-09-30 VITALS — BP 138/74 | HR 60 | Wt 293.6 lb

## 2021-09-30 DIAGNOSIS — Z951 Presence of aortocoronary bypass graft: Secondary | ICD-10-CM | POA: Insufficient documentation

## 2021-09-30 DIAGNOSIS — Z7984 Long term (current) use of oral hypoglycemic drugs: Secondary | ICD-10-CM | POA: Insufficient documentation

## 2021-09-30 DIAGNOSIS — I2510 Atherosclerotic heart disease of native coronary artery without angina pectoris: Secondary | ICD-10-CM

## 2021-09-30 DIAGNOSIS — I5022 Chronic systolic (congestive) heart failure: Secondary | ICD-10-CM

## 2021-09-30 DIAGNOSIS — I255 Ischemic cardiomyopathy: Secondary | ICD-10-CM | POA: Insufficient documentation

## 2021-09-30 DIAGNOSIS — I110 Hypertensive heart disease with heart failure: Secondary | ICD-10-CM | POA: Insufficient documentation

## 2021-09-30 DIAGNOSIS — I6523 Occlusion and stenosis of bilateral carotid arteries: Secondary | ICD-10-CM

## 2021-09-30 DIAGNOSIS — Z7982 Long term (current) use of aspirin: Secondary | ICD-10-CM | POA: Insufficient documentation

## 2021-09-30 DIAGNOSIS — Z79899 Other long term (current) drug therapy: Secondary | ICD-10-CM | POA: Insufficient documentation

## 2021-09-30 LAB — BASIC METABOLIC PANEL
Anion gap: 7 (ref 5–15)
BUN: 32 mg/dL — ABNORMAL HIGH (ref 8–23)
CO2: 22 mmol/L (ref 22–32)
Calcium: 8.2 mg/dL — ABNORMAL LOW (ref 8.9–10.3)
Chloride: 110 mmol/L (ref 98–111)
Creatinine, Ser: 1.42 mg/dL — ABNORMAL HIGH (ref 0.61–1.24)
GFR, Estimated: 54 mL/min — ABNORMAL LOW (ref >60)
Glucose, Bld: 103 mg/dL — ABNORMAL HIGH (ref 70–99)
Potassium: 3.8 mmol/L (ref 3.5–5.1)
Sodium: 139 mmol/L (ref 135–145)

## 2021-09-30 LAB — BRAIN NATRIURETIC PEPTIDE: B Natriuretic Peptide: 243.5 pg/mL — ABNORMAL HIGH (ref 0.0–100.0)

## 2021-09-30 NOTE — Patient Instructions (Addendum)
Thank you for coming in today  Labs were done today, if any labs are abnormal the clinic will call you No news is good news  Your physician recommends that you schedule a follow-up appointment in:  4 months with Dr. Aundra Dubin Please call in November to schedule for January 2024   Do the following things EVERYDAY: Weigh yourself in the morning before breakfast. Write it down and keep it in a log. Take your medicines as prescribed Eat low salt foods--Limit salt (sodium) to 2000 mg per day.  Stay as active as you can everyday Limit all fluids for the day to less than 2 liters  At the [facility], you and your health needs are our priority. As part of our continuing mission to provide you with exceptional heart care, we have created designated Provider Care Teams. These Care Teams include your primary Cardiologist (physician) and Advanced Practice Providers (APPs- Physician Assistants and Nurse Practitioners) who all work together to provide you with the care you need, when you need it.   You may see any of the following providers on your designated Care Team at your next follow up: Dr Glori Bickers Dr [name] Dr. Roxana Hires, NP Lyda Jester, [place] [facility] [place], [place] Forestine Na, NP Audry Riles, PharmD   Please be sure to bring in all your medications bottles to every appointment.   If you have any questions or concerns before your next appointment please send [place] a message through [place] or call our office at [phone].    TO LEAVE A MESSAGE FOR THE NURSE SELECT OPTION 2, PLEASE LEAVE A MESSAGE INCLUDING: YOUR NAME DATE OF BIRTH CALL BACK NUMBER REASON FOR CALL**this is important as we prioritize the call backs  YOU WILL RECEIVE A CALL BACK THE SAME DAY AS LONG AS YOU CALL BEFORE 4:00 PM

## 2021-10-01 ENCOUNTER — Telehealth (HOSPITAL_COMMUNITY): Payer: Self-pay | Admitting: Surgery

## 2021-10-01 DIAGNOSIS — I5022 Chronic systolic (congestive) heart failure: Secondary | ICD-10-CM

## 2021-10-01 NOTE — Telephone Encounter (Signed)
I attempted to reach patient to review results and recommendations per provider.  I left a message for a return call. 

## 2021-10-01 NOTE — Telephone Encounter (Signed)
-----   Message from Rafael Bihari, [place] sent at 09/30/2021  4:25 PM EDT ----- Kidney function mildly elevated. Please repeat BMET in 2 weeks to follow

## 2021-10-01 NOTE — Telephone Encounter (Signed)
Patient called me back and I reviewed his results.  He will return on Sept 22 for repeat labwork.  Ordered placed in [facility]L.

## 2021-10-01 NOTE — Telephone Encounter (Signed)
-----   Message from Rafael Bihari, [place] sent at 09/30/2021  4:25 PM EDT ----- Kidney function mildly elevated. Please repeat BMET in 2 weeks to follow

## 2021-10-04 IMAGING — CR DG CHEST 2V
2 series · 2 of 2 positions shown · non-contrast
Comparison: December 19, 2018

CLINICAL DATA: Hypertension.

EXAM:
CHEST - 2 VIEW

[chest lat]
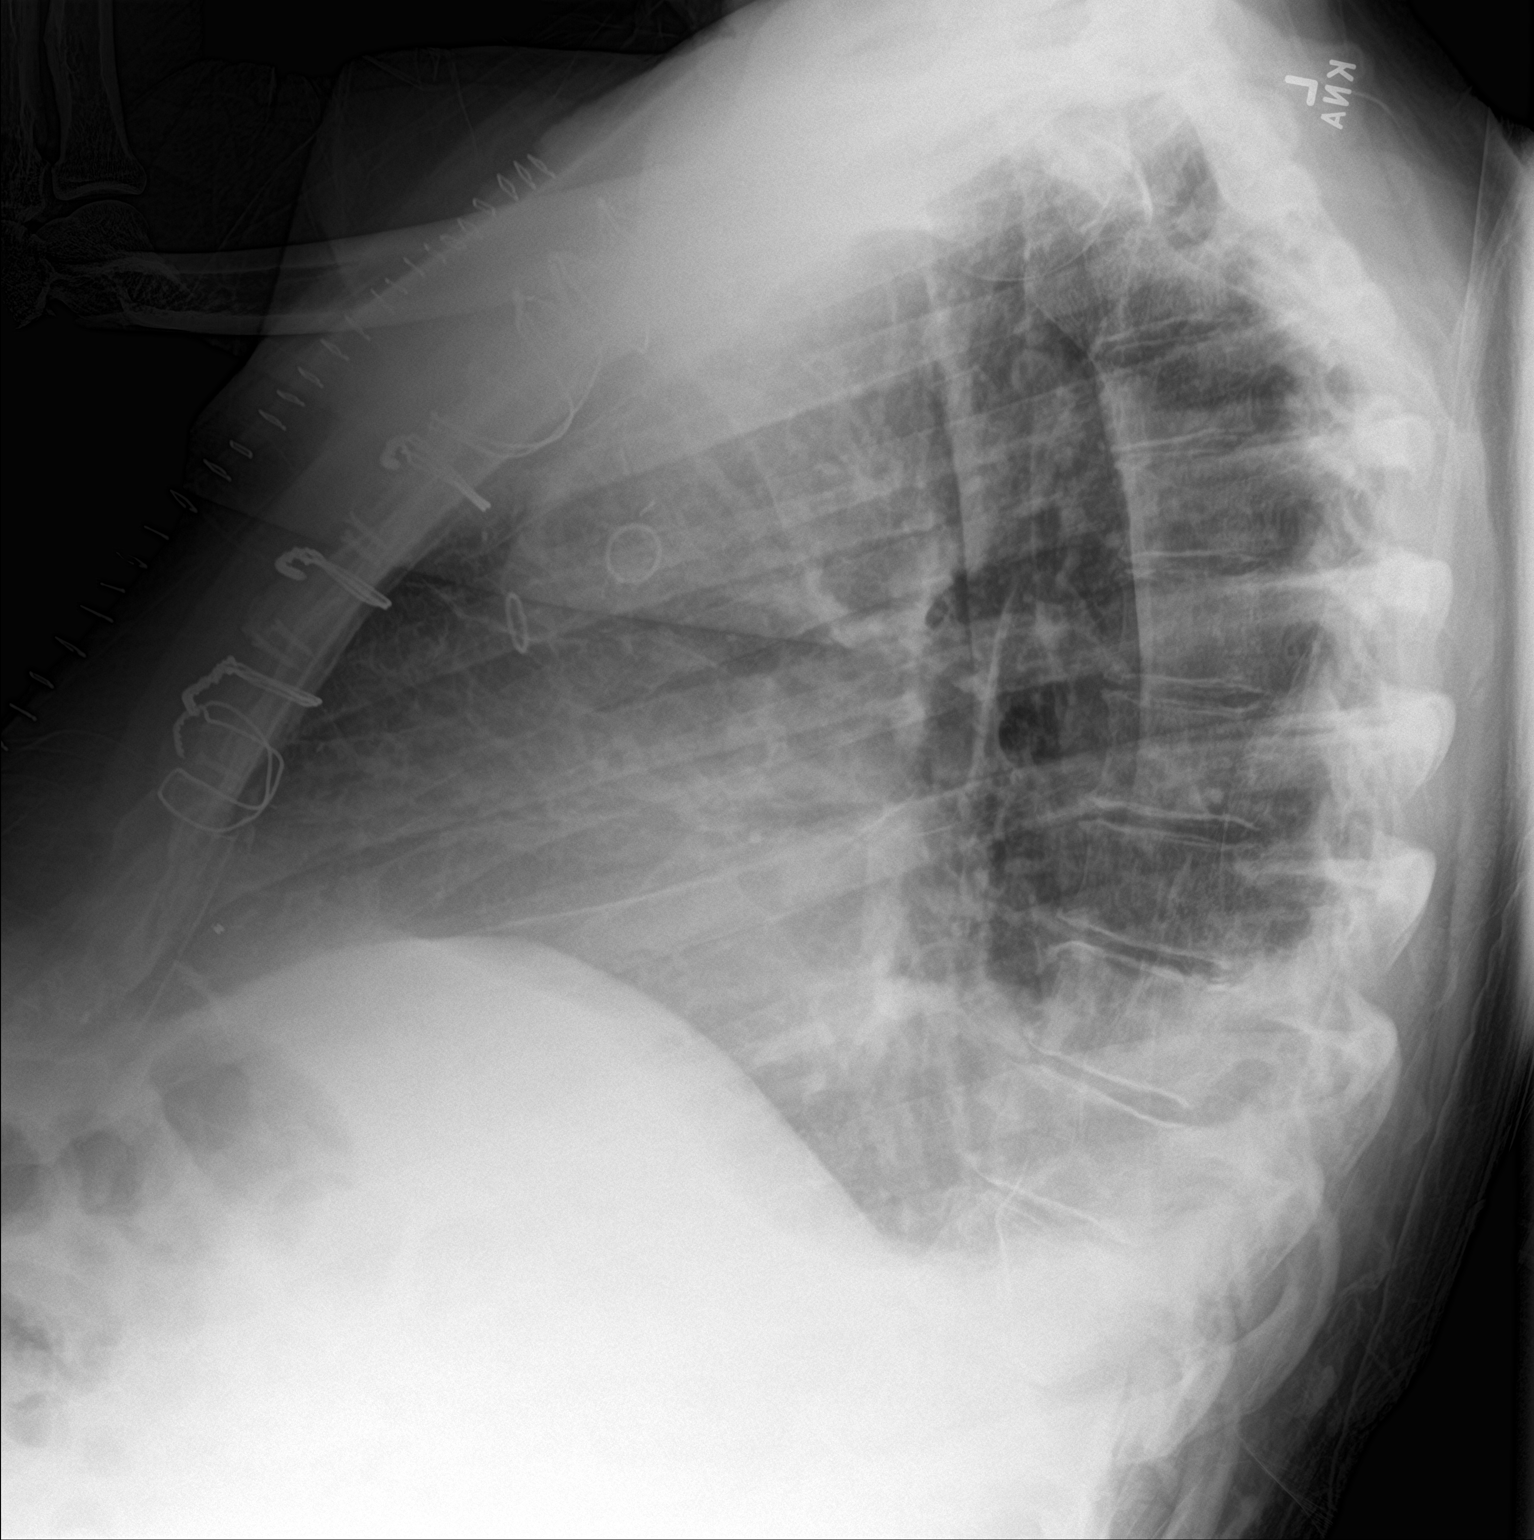

[chest ap]
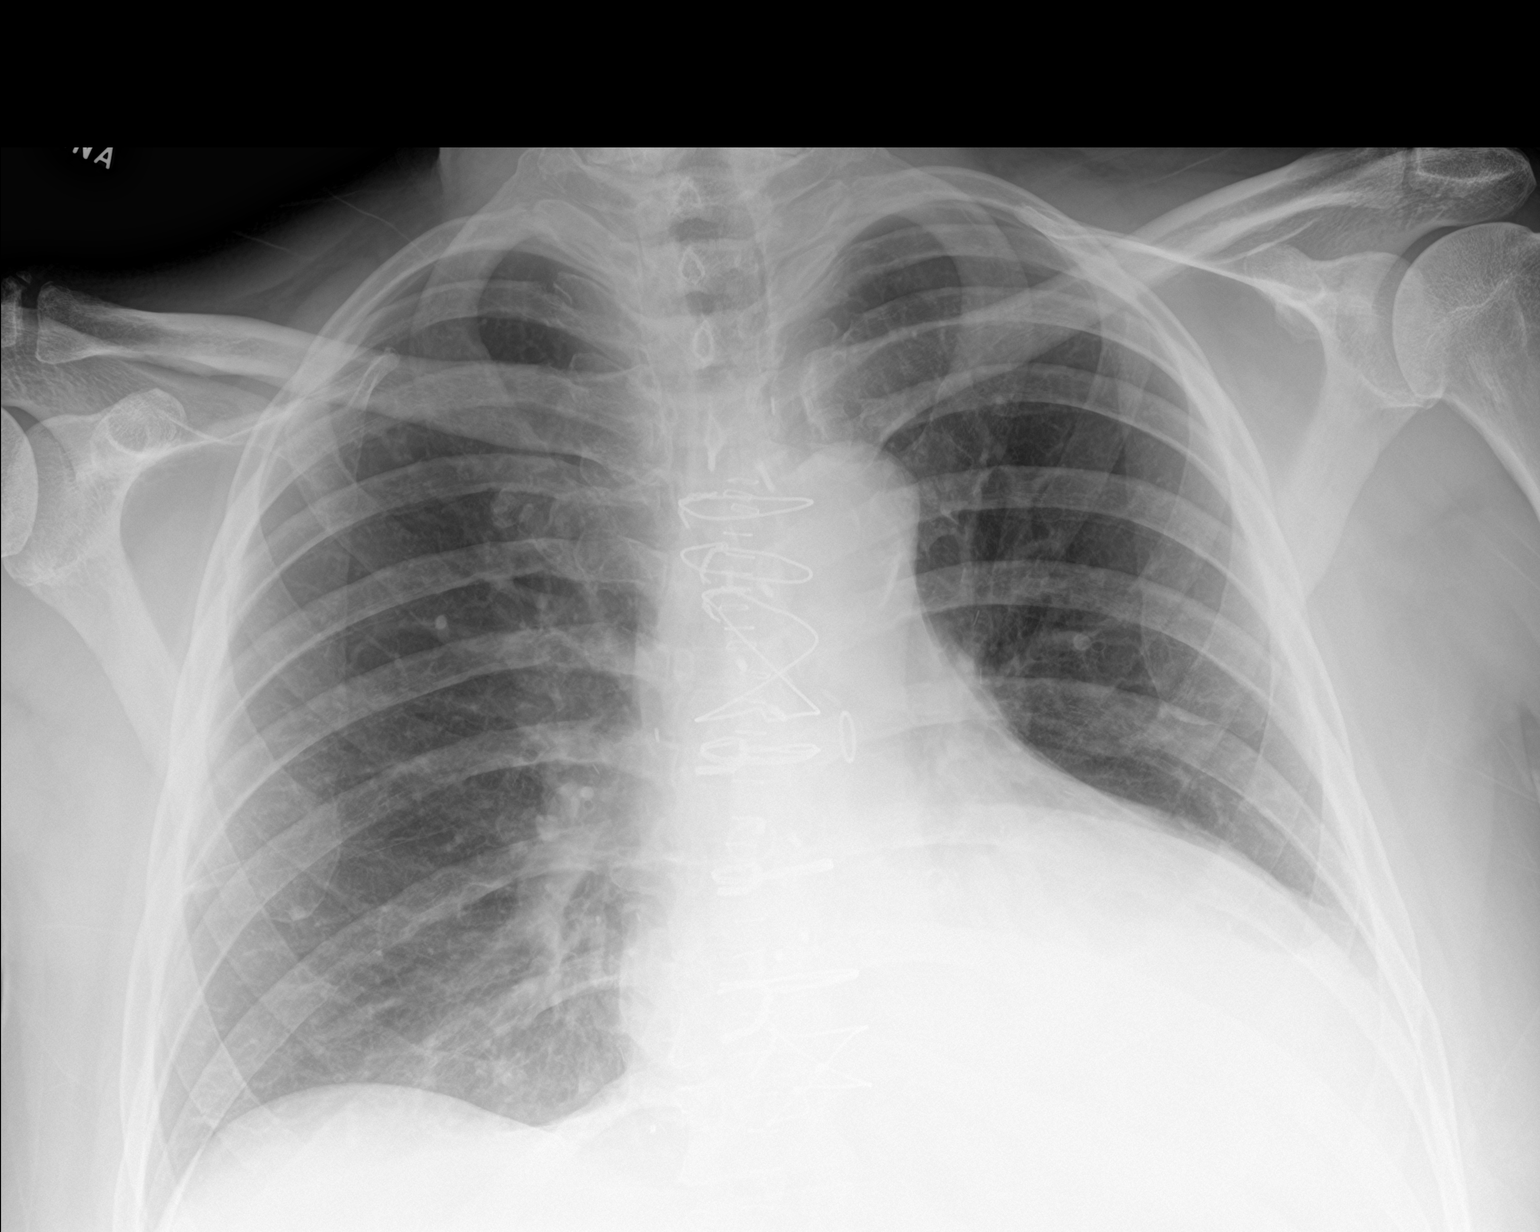

[2 of 2 positions shown; findings below may reference images not displayed]

FINDINGS: Small effusions remain with underlying atelectasis in the left base.
The cardiomediastinal silhouette is stable. No pneumothorax. No
nodules or masses. No suspicious infiltrates.
IMPRESSION: Small bilateral effusions with underlying atelectasis, best seen on
the lateral view. No other acute abnormalities.

## 2021-10-14 ENCOUNTER — Other Ambulatory Visit (HOSPITAL_COMMUNITY): Payer: Self-pay

## 2021-10-14 MED FILL — Atorvastatin Calcium Tab 40 MG (Base Equivalent): ORAL | 30 days supply | Qty: 30 | Fill #2 | Status: AC

## 2021-10-15 ENCOUNTER — Ambulatory Visit (HOSPITAL_COMMUNITY)
Admission: RE | Admit: 2021-10-15 | Discharge: 2021-10-15 | Disposition: A | Discharge status: Home / Self Care | Payer: Self-pay | Source: Ambulatory Visit | Attending: Internal Medicine | Admitting: Internal Medicine

## 2021-10-15 DIAGNOSIS — I5022 Chronic systolic (congestive) heart failure: Secondary | ICD-10-CM | POA: Insufficient documentation

## 2021-10-15 LAB — BASIC METABOLIC PANEL
Anion gap: 8 (ref 5–15)
BUN: 25 mg/dL — ABNORMAL HIGH (ref 8–23)
CO2: 22 mmol/L (ref 22–32)
Calcium: 8.5 mg/dL — ABNORMAL LOW (ref 8.9–10.3)
Chloride: 108 mmol/L (ref 98–111)
Creatinine, Ser: 1.25 mg/dL — ABNORMAL HIGH (ref 0.61–1.24)
GFR, Estimated: >60 mL/min (ref >60)
Glucose, Bld: 113 mg/dL — ABNORMAL HIGH (ref 70–99)
Potassium: 3.7 mmol/L (ref 3.5–5.1)
Sodium: 138 mmol/L (ref 135–145)

## 2021-10-16 MED FILL — Furosemide Tab 40 MG: ORAL | 30 days supply | Qty: 30 | Fill #2 | Status: AC

## 2021-10-18 ENCOUNTER — Other Ambulatory Visit (HOSPITAL_COMMUNITY): Payer: Self-pay

## 2021-10-27 ENCOUNTER — Other Ambulatory Visit (HOSPITAL_COMMUNITY): Payer: Self-pay

## 2021-10-27 MED FILL — Spironolactone Tab 25 MG: ORAL | 30 days supply | Qty: 30 | Fill #3 | Status: AC

## 2021-10-29 ENCOUNTER — Other Ambulatory Visit (HOSPITAL_COMMUNITY): Payer: Self-pay

## 2021-11-09 ENCOUNTER — Other Ambulatory Visit (HOSPITAL_COMMUNITY): Payer: Self-pay

## 2021-11-09 MED FILL — Atorvastatin Calcium Tab 40 MG (Base Equivalent): ORAL | 30 days supply | Qty: 30 | Fill #3 | Status: AC

## 2021-11-15 ENCOUNTER — Other Ambulatory Visit (HOSPITAL_COMMUNITY): Payer: Self-pay

## 2021-11-15 MED FILL — Furosemide Tab 40 MG: ORAL | 30 days supply | Qty: 30 | Fill #3 | Status: AC

## 2021-11-19 ENCOUNTER — Other Ambulatory Visit (HOSPITAL_COMMUNITY): Payer: Self-pay

## 2021-11-23 ENCOUNTER — Other Ambulatory Visit (HOSPITAL_COMMUNITY): Payer: Self-pay

## 2021-11-23 MED FILL — Atorvastatin Calcium Tab 40 MG (Base Equivalent): ORAL | 30 days supply | Qty: 30 | Fill #4 | Status: AC

## 2021-11-30 ENCOUNTER — Other Ambulatory Visit (HOSPITAL_COMMUNITY): Payer: Self-pay

## 2021-12-03 ENCOUNTER — Other Ambulatory Visit (HOSPITAL_COMMUNITY): Payer: Self-pay

## 2021-12-03 MED FILL — Spironolactone Tab 25 MG: ORAL | 30 days supply | Qty: 30 | Fill #4 | Status: AC

## 2021-12-13 ENCOUNTER — Other Ambulatory Visit (HOSPITAL_COMMUNITY): Payer: Self-pay

## 2021-12-21 ENCOUNTER — Other Ambulatory Visit (HOSPITAL_COMMUNITY): Payer: Self-pay

## 2021-12-22 ENCOUNTER — Telehealth (HOSPITAL_COMMUNITY): Payer: Self-pay

## 2021-12-22 NOTE — Telephone Encounter (Signed)
Advanced Heart Failure Patient Advocate Encounter  Received notification that renewal is due to [employer]). Contacted patient and confirmed that renewal is needed for this program. He will bring POI for self and wife sometime this week.  I have left a copy of the patient form at registration desk to be signed. Will send in renewal application along with POI once obtained.  Clista Bernhardt, CPhT Rx Patient Advocate Phone: [phone]

## 2021-12-28 ENCOUNTER — Telehealth (HOSPITAL_COMMUNITY): Payer: Self-pay | Admitting: Licensed Clinical Social Worker

## 2021-12-28 ENCOUNTER — Other Ambulatory Visit (HOSPITAL_COMMUNITY): Payer: Self-pay

## 2021-12-28 MED FILL — Spironolactone Tab 25 MG: ORAL | 30 days supply | Qty: 30 | Fill #5 | Status: AC

## 2021-12-28 NOTE — Telephone Encounter (Signed)
H&V Care Navigation CSW Progress Note  Clinical Social Worker called pt to discuss possible insurance enrollment as pt is currently utilizing the Heart Failure fund.  CSW explained that because pt is only enrolled in Medicare part A and has no other form of coverage that he is getting penalized for not enrolling in part B and D coverage while he is eliglble- explained that this penalizing is compounded the longer he avoids enrollment so that enrolling this year would be best for him.  Pt hasn't enrolled in part B and D due to financial concerns.  CSW discussed pt current financial situation and pt reports his dual income with his wife is about $2,400/month.  With this income he should be eligible for some kind of reduction in premium cost for his part D and to have his part B and A premiums waived. CSW informed pt that open enrollment is ending this week and provided him with the number to [facility] to call and get enrolled in additional insurance and to apply for assistance.   SDOH Screenings   Depression (PHQ2-9): Low Risk  (05/07/2021)  Financial Resource Strain: High Risk (12/28/2021)  Tobacco Use: Low Risk  (09/30/2021)   Jorge Ny, LCSW Clinical Social Worker [facility] Desk#: [phone] Cell#: [phone]

## 2021-12-30 ENCOUNTER — Telehealth: Payer: Self-pay | Admitting: Cardiology

## 2021-12-30 ENCOUNTER — Other Ambulatory Visit: Payer: Self-pay

## 2021-12-30 DIAGNOSIS — I6523 Occlusion and stenosis of bilateral carotid arteries: Secondary | ICD-10-CM

## 2021-12-30 NOTE — Telephone Encounter (Signed)
Patient would like to postpone his Carotid test to 02/24/22 at 8:30 am.  Orders currently expire 1/4.  Patient stated can leave VM to confirm appointment change.

## 2021-12-30 NOTE — Telephone Encounter (Signed)
I have reordered the Carotid US- it can now be scheduled for when patient is able.   Please call back and get scheduled.  Thank you!

## 2022-01-05 NOTE — Telephone Encounter (Signed)
Advanced Heart Failure Patient Advocate Encounter  Application for Entresto faxed to [employer] on 01/05/22. Application form attached to patient chart.

## 2022-01-12 MED FILL — Atorvastatin Calcium Tab 40 MG (Base Equivalent): ORAL | 30 days supply | Qty: 30 | Fill #5 | Status: AC

## 2022-01-12 NOTE — Telephone Encounter (Signed)
Advanced Heart Failure Patient Advocate Encounter  Patient was approved to receive Entresto from [employer] Effective 01/11/22 to 01/24/23 Determination letter has been added to patient chart. Left voicemail regarding approval and [employer] contact number

## 2022-01-13 ENCOUNTER — Other Ambulatory Visit: Payer: Self-pay

## 2022-01-27 ENCOUNTER — Other Ambulatory Visit (HOSPITAL_COMMUNITY): Payer: Self-pay | Admitting: Cardiology

## 2022-01-27 ENCOUNTER — Inpatient Hospital Stay (HOSPITAL_COMMUNITY): Admission: RE | Admit: 2022-01-27 | Payer: Self-pay | Source: Ambulatory Visit

## 2022-01-27 ENCOUNTER — Other Ambulatory Visit (HOSPITAL_COMMUNITY): Payer: Self-pay

## 2022-01-27 MED ORDER — FUROSEMIDE 40 MG PO TABS
40.0000 mg | ORAL_TABLET | Freq: Every day | ORAL | 3 refills | Status: DC
  Filled 2022-01-27: qty 30, 30d supply, fill #0
  Filled 2022-03-02: qty 30, 30d supply, fill #1
  Filled 2022-03-30 – 2022-05-12 (×2): qty 30, 30d supply, fill #2
  Filled 2022-06-21: qty 30, 30d supply, fill #3

## 2022-01-27 MED FILL — Spironolactone Tab 25 MG: ORAL | 30 days supply | Qty: 30 | Fill #6 | Status: AC

## 2022-02-08 ENCOUNTER — Other Ambulatory Visit (HOSPITAL_COMMUNITY): Payer: Self-pay

## 2022-02-08 ENCOUNTER — Other Ambulatory Visit: Payer: Self-pay

## 2022-02-08 MED FILL — Atorvastatin Calcium Tab 40 MG (Base Equivalent): ORAL | 30 days supply | Qty: 30 | Fill #6 | Status: AC

## 2022-02-16 ENCOUNTER — Other Ambulatory Visit (HOSPITAL_COMMUNITY): Payer: Self-pay

## 2022-02-24 ENCOUNTER — Ambulatory Visit (HOSPITAL_COMMUNITY)
Admission: RE | Admit: 2022-02-24 | Payer: Self-pay | Source: Ambulatory Visit | Attending: Cardiology | Admitting: Cardiology

## 2022-02-25 ENCOUNTER — Other Ambulatory Visit (HOSPITAL_COMMUNITY): Payer: Self-pay

## 2022-02-25 ENCOUNTER — Other Ambulatory Visit (HOSPITAL_COMMUNITY): Payer: Self-pay | Admitting: Cardiology

## 2022-02-25 MED ORDER — SPIRONOLACTONE 25 MG PO TABS
25.0000 mg | ORAL_TABLET | Freq: Every day | ORAL | 6 refills | Status: DC
  Filled 2022-02-25: qty 30, 30d supply, fill #0
  Filled 2022-03-27: qty 30, 30d supply, fill #1
  Filled 2022-04-25: qty 30, 30d supply, fill #2
  Filled 2022-05-26: qty 30, 30d supply, fill #3
  Filled 2022-06-24 (×2): qty 30, 30d supply, fill #4
  Filled 2022-08-02: qty 30, 30d supply, fill #5
  Filled 2022-08-29: qty 30, 30d supply, fill #6

## 2022-03-02 ENCOUNTER — Other Ambulatory Visit: Payer: Self-pay

## 2022-03-07 ENCOUNTER — Other Ambulatory Visit: Payer: Self-pay

## 2022-03-07 ENCOUNTER — Encounter: Payer: Self-pay | Admitting: Nurse Practitioner

## 2022-03-07 ENCOUNTER — Ambulatory Visit: Payer: Self-pay | Admitting: *Deleted

## 2022-03-07 ENCOUNTER — Other Ambulatory Visit (HOSPITAL_COMMUNITY): Payer: Self-pay

## 2022-03-07 NOTE — Telephone Encounter (Signed)
Reason for Disposition  [1] Red area or streak [2] large (> 2 in. or 5 cm)  Answer Assessment - Initial Assessment Questions 1. ONSET: "When did the swelling start?" (e.g., minutes, hours, days)     6 days ago 2. LOCATION: "What part of the leg is swollen?"  "Are both legs swollen or just one leg?"     Left- foot/calve 3. SEVERITY: "How bad is the swelling?" (e.g., localized; mild, moderate, severe)   - Localized: Small area of swelling localized to one leg.   - MILD pedal edema: Swelling limited to foot and ankle, pitting edema < 1/4 inch (6 mm) deep, rest and elevation eliminate most or all swelling.   - MODERATE edema: Swelling of lower leg to knee, pitting edema > 1/4 inch (6 mm) deep, rest and elevation only partially reduce swelling.   - SEVERE edema: Swelling extends above knee, facial or hand swelling present.      moderate 4. REDNESS: "Does the swelling look red or infected?"     Yes- warmth 5. PAIN: "Is the swelling painful to touch?" If Yes, ask: "How painful is it?"   (Scale 1-10; mild, moderate or severe)     When first starts walking on it 6. FEVER: "Do you have a fever?" If Yes, ask: "What is it, how was it measured, and when did it start?"      no 7. CAUSE: "What do you think is causing the leg swelling?"     unsure 8. MEDICAL HISTORY: "Do you have a history of blood clots (e.g., DVT), cancer, heart failure, kidney disease, or liver failure?"     CHF 9. RECURRENT SYMPTOM: "Have you had leg swelling before?" If Yes, ask: "When was the last time?" "What happened that time?"     Not this bad 10. OTHER SYMPTOMS: "Do you have any other symptoms?" (e.g., chest pain, difficulty breathing)       no  Protocols used: Leg Swelling and Edema-A-AH

## 2022-03-07 NOTE — Telephone Encounter (Signed)
  Chief Complaint: left foot swollen Symptoms: foot/leg swelling, red and warm Frequency: 6 days Pertinent Negatives: Patient denies chest pain, difficulty breathing  Disposition: '[]'$ ED /'[x]'$ Urgent Care (no appt availability in office) / '[]'$ Appointment(In office/virtual)/ '[]'$  [facility]/ '[]'$ Home Care/ '[]'$ Refused Recommended Disposition /'[]'$ [facility]/ '[]'$  Follow-up with PCP Additional Notes: Advised UC per protocol

## 2022-03-07 NOTE — Telephone Encounter (Signed)
Patient spoke with PCP and place given an appointment for tomorrow.

## 2022-03-08 ENCOUNTER — Ambulatory Visit (HOSPITAL_COMMUNITY)
Admission: RE | Admit: 2022-03-08 | Discharge: 2022-03-08 | Disposition: A | Discharge status: Home / Self Care | Payer: Self-pay | Source: Ambulatory Visit | Attending: Cardiology | Admitting: Cardiology

## 2022-03-08 ENCOUNTER — Other Ambulatory Visit: Payer: Self-pay

## 2022-03-08 ENCOUNTER — Ambulatory Visit: Payer: Self-pay | Attending: Nurse Practitioner | Admitting: Nurse Practitioner

## 2022-03-08 ENCOUNTER — Other Ambulatory Visit (HOSPITAL_COMMUNITY): Payer: Self-pay

## 2022-03-08 ENCOUNTER — Encounter: Payer: Self-pay | Admitting: Nurse Practitioner

## 2022-03-08 VITALS — BP 109/63 | HR 65 | Ht 75.0 in | Wt 293.0 lb

## 2022-03-08 DIAGNOSIS — I6523 Occlusion and stenosis of bilateral carotid arteries: Secondary | ICD-10-CM | POA: Insufficient documentation

## 2022-03-08 DIAGNOSIS — I10 Essential (primary) hypertension: Secondary | ICD-10-CM

## 2022-03-08 DIAGNOSIS — R600 Localized edema: Secondary | ICD-10-CM

## 2022-03-08 DIAGNOSIS — R7303 Prediabetes: Secondary | ICD-10-CM

## 2022-03-08 DIAGNOSIS — L03116 Cellulitis of left lower limb: Secondary | ICD-10-CM

## 2022-03-08 MED ORDER — SULFAMETHOXAZOLE-TRIMETHOPRIM 800-160 MG PO TABS
1.0000 | ORAL_TABLET | Freq: Two times a day (BID) | ORAL | 0 refills | Status: AC
  Filled 2022-03-08: qty 14, 7d supply, fill #0

## 2022-03-08 MED FILL — Atorvastatin Calcium Tab 40 MG (Base Equivalent): ORAL | 30 days supply | Qty: 30 | Fill #7 | Status: AC

## 2022-03-08 NOTE — Progress Notes (Signed)
Assessment & Plan:  Diagnoses and all orders for this visit:  Cellulitis of left lower extremity -     sulfamethoxazole-trimethoprim (BACTRIM DS) 800-160 MG tablet; Take 1 tablet by mouth 2 (two) times daily for 7 days. -     CBC with Differential  Primary hypertension -     CMP14+EGFR Continue all antihypertensives as prescribed.  Reminded to bring in blood pressure log for follow  up appointment.  RECOMMENDATIONS: DASH/Mediterranean Diets are healthier choices for HTN.    Prediabetes -     Hemoglobin A1c  Lower extremity edema -     VAS [place] LOWER EXTREMITY VENOUS (DVT); Future    Patient has been counseled on age-appropriate routine health concerns for screening and prevention. These are reviewed and up-to-date. Referrals have been placed accordingly. Immunizations are up-to-date or declined.    Subjective:  No chief complaint on file.  HPI Frank Love 67 y.o. male presents to office today for cellulitis  He also has a PMH of BLE edema, CHF, CAD (he has been followed by the heart failure clinic and cardiology   Cellulitis Frank Love complain of left foot and leg swelling, pain, erythema, warmth. Denies being bitten by any insects.  Associated symptoms: decrease in appetite and nausea. Denies: fever. Patient has not had previous evaluation of rash. Patient has not had previous treatment.  Patient has not identified precipitant. He has been taking furosemide as prescribed. Unable to apply compression sock due to edema. Denies any worsening shortness of breath.    Review of Systems  Constitutional:  Negative for fever, malaise/fatigue and weight loss.  HENT: Negative.  Negative for nosebleeds.   Eyes: Negative.  Negative for blurred vision, double vision and photophobia.  Respiratory: Negative.  Negative for cough, shortness of breath and wheezing.   Cardiovascular:  Positive for leg swelling. Negative for chest pain and palpitations.  Gastrointestinal: Negative.   Negative for heartburn, nausea and vomiting.  Musculoskeletal: Negative.  Negative for myalgias.  Neurological: Negative.  Negative for dizziness, focal weakness, seizures and headaches.  Psychiatric/Behavioral: Negative.  Negative for suicidal ideas.     Past Medical History:  Diagnosis Date   Cancer ([place])    penile cancer   CHF (congestive heart failure) (HCC)    Chronic pain of right knee    Coronary artery disease    Hypertension    Phimosis     Past Surgical History:  Procedure Laterality Date   CIRCUMCISION N/A 10/12/2018   Procedure: CIRCUMCISION ADULT;  Surgeon: Franchot Gallo, MD;  Location: [facility];  Service: Urology;  Laterality: N/A;  45 mins   CO2 LASER APPLICATION N/A 123456   Procedure: CO2 LASER APPLICATION;  Surgeon: Franchot Gallo, MD;  Location: [facility];  Service: Urology;  Laterality: N/A;  30 MINS   COLONOSCOPY     CORONARY ARTERY BYPASS GRAFT N/A 12/12/2018   Procedure: CORONARY ARTERY BYPASS GRAFTING (CABG) x four, using bilateral internal mammary arteries and right leg greater saphenous vein harvested endoscopically;  Surgeon: [name], MD;  Location: [place];  Service: Open Heart Surgery;  Laterality: N/A;   LEFT HEART CATH AND CORONARY ANGIOGRAPHY N/A 11/28/2018   Procedure: LEFT HEART CATH AND CORONARY ANGIOGRAPHY;  Surgeon: Burnell Blanks, MD;  Location: [place] [facility];  Service: Cardiovascular;  Laterality: N/A;   MEDIASTINAL EXPLORATION N/A 12/12/2018   Procedure: Mediastinal Re-Exploration after bypass graft;  Surgeon: [name], MD;  Location: [place];  Service: Open Heart Surgery;  Laterality: N/A;   TEE WITHOUT CARDIOVERSION N/A 12/12/2018   Procedure: TRANSESOPHAGEAL ECHOCARDIOGRAM (TEE);  Surgeon: [name], MD;  Location: [place];  Service: Open Heart Surgery;  Laterality: N/A;   TOOTH EXTRACTION     TOTAL HIP ARTHROPLASTY Right 03/08/2019   Procedure: RIGHT TOTAL HIP ARTHROPLASTY ANTERIOR APPROACH;  Surgeon:  Mcarthur Rossetti, MD;  Location: [facility];  Service: Orthopedics;  Laterality: Right;    Family History  Problem Relation Age of Onset   Diabetes Mother    Heart disease Mother     Social History Reviewed with no changes to be made today.   Outpatient Medications Prior to Visit  Medication Sig Dispense Refill   aspirin EC 81 MG tablet Take 1 tablet (81 mg total) by mouth daily. Swallow whole. 90 tablet 3   atorvastatin (LIPITOR) 40 MG tablet Take 1 tablet (40 mg total) by mouth daily at 6 PM. 90 tablet 30   carvedilol (COREG) 6.25 MG tablet Take 1.5 tablets (9.375 mg total) by mouth 2 (two) times daily. 180 tablet 3   dapagliflozin propanediol (FARXIGA) 10 MG TABS tablet TAKE 1 TABLET (10 MG TOTAL) BY MOUTH DAILY BEFORE BREAKFAST. 30 tablet 5   furosemide (LASIX) 40 MG tablet Take 1 tablet (40 mg total) by mouth daily. 30 tablet 3   nitroGLYCERIN (NITROSTAT) 0.4 MG SL tablet Place 1 tablet (0.4 mg total) under the tongue every 5 (five) minutes as needed for chest pain. NO more than 3 pills--if you have the need to take 2 pills or more call MD 30 tablet 3   sacubitril-valsartan (ENTRESTO) 97-103 MG Take 1 tablet by mouth 2 (two) times daily. 180 tablet 3   spironolactone (ALDACTONE) 25 MG tablet Take 1 tablet (25 mg total) by mouth daily. 30 tablet 6   No facility-administered medications prior to visit.    No Known Allergies     Objective:    BP 109/63   Pulse 65   Ht 6' 3"$  (1.905 m)   Wt 293 lb (132.9 kg)   SpO2 96%   BMI 36.62 kg/m  Wt Readings from Last 3 Encounters:  03/08/22 293 lb (132.9 kg)  09/30/21 293 lb 9.6 oz (133.2 kg)  05/07/21 283 lb 12.8 oz (128.7 kg)    Physical Exam Vitals and nursing note reviewed.  Constitutional:      Appearance: He is well-developed.  HENT:     Head: Normocephalic and atraumatic.  Cardiovascular:     Rate and Rhythm: Normal rate and regular rhythm.     Heart sounds: Normal heart sounds. No murmur heard.    No friction  rub. No gallop.  Pulmonary:     Effort: Pulmonary effort is normal. No tachypnea or respiratory distress.     Breath sounds: Normal breath sounds. No decreased breath sounds, wheezing, rhonchi or rales.  Chest:     Chest wall: No tenderness.  Abdominal:     General: Bowel sounds are normal.     Palpations: Abdomen is soft.  Musculoskeletal:        General: Normal range of motion.     Cervical back: Normal range of motion.     Right lower leg: No swelling. No edema.     Left lower leg: Swelling and tenderness present. 2+ Edema present.     Left ankle: Swelling present. Tenderness present.     Left foot: Swelling and tenderness present.  Skin:    General: Skin is warm and dry.  Neurological:  Mental Status: He is alert and oriented to person, place, and time.     Coordination: Coordination normal.  Psychiatric:        Behavior: Behavior normal. Behavior is cooperative.        Thought Content: Thought content normal.        Judgment: Judgment normal.          Patient has been counseled extensively about nutrition and exercise as well as the importance of adherence with medications and regular follow-up. The patient was given clear instructions to go to ER or return to medical center if symptoms don't improve, worsen or new problems develop. The patient verbalized understanding.   Follow-up: Return in about 1 week (around 03/15/2022).   [name], FNP-BC [facility], [place]   03/08/2022, 10:44 PM

## 2022-03-08 NOTE — Progress Notes (Addendum)
Swelling of left foot and leg. Warm to touch and red.

## 2022-03-09 ENCOUNTER — Other Ambulatory Visit: Payer: Self-pay | Admitting: *Deleted

## 2022-03-09 ENCOUNTER — Ambulatory Visit (HOSPITAL_COMMUNITY)
Admission: RE | Admit: 2022-03-09 | Discharge: 2022-03-09 | Disposition: A | Discharge status: Home / Self Care | Payer: Self-pay | Source: Ambulatory Visit | Attending: Cardiovascular Disease | Admitting: Cardiovascular Disease

## 2022-03-09 DIAGNOSIS — I6523 Occlusion and stenosis of bilateral carotid arteries: Secondary | ICD-10-CM

## 2022-03-09 DIAGNOSIS — R600 Localized edema: Secondary | ICD-10-CM | POA: Insufficient documentation

## 2022-03-09 LAB — CBC WITH DIFFERENTIAL/PLATELET
Basophils Absolute: 0.1 10*3/uL (ref 0.0–0.2)
Basos: 1 %
EOS (ABSOLUTE): 0.5 10*3/uL — ABNORMAL HIGH (ref 0.0–0.4)
Eos: 9 %
Hematocrit: 39.7 % (ref 37.5–51.0)
Hemoglobin: 13 g/dL (ref 13.0–17.7)
Immature Grans (Abs): 0.1 10*3/uL (ref 0.0–0.1)
Immature Granulocytes: 1 %
Lymphocytes Absolute: 0.9 10*3/uL (ref 0.7–3.1)
Lymphs: 15 %
MCH: 27.8 pg (ref 26.6–33.0)
MCHC: 32.7 g/dL (ref 31.5–35.7)
MCV: 85 fL (ref 79–97)
Monocytes Absolute: 0.4 10*3/uL (ref 0.1–0.9)
Monocytes: 6 %
Neutrophils Absolute: 4.2 10*3/uL (ref 1.4–7.0)
Neutrophils: 68 %
Platelets: 251 10*3/uL (ref 150–450)
RBC: 4.67 x10E6/uL (ref 4.14–5.80)
RDW: 13.7 % (ref 11.6–15.4)
WBC: 6.2 10*3/uL (ref 3.4–10.8)

## 2022-03-09 LAB — CMP14+EGFR
ALT: 15 IU/L (ref 0–44)
AST: 17 IU/L (ref 0–40)
Albumin/Globulin Ratio: 1.1 — ABNORMAL LOW (ref 1.2–2.2)
Albumin: 3.7 g/dL — ABNORMAL LOW (ref 3.9–4.9)
Alkaline Phosphatase: 74 IU/L (ref 44–121)
BUN/Creatinine Ratio: 21 (ref 10–24)
BUN: 29 mg/dL — ABNORMAL HIGH (ref 8–27)
Bilirubin Total: 0.6 mg/dL (ref 0.0–1.2)
CO2: 20 mmol/L (ref 20–29)
Calcium: 8.6 mg/dL (ref 8.6–10.2)
Chloride: 103 mmol/L (ref 96–106)
Creatinine, Ser: 1.4 mg/dL — ABNORMAL HIGH (ref 0.76–1.27)
Globulin, Total: 3.3 g/dL (ref 1.5–4.5)
Glucose: 129 mg/dL — ABNORMAL HIGH (ref 70–99)
Potassium: 4.3 mmol/L (ref 3.5–5.2)
Sodium: 139 mmol/L (ref 134–144)
Total Protein: 7 g/dL (ref 6.0–8.5)
eGFR: 55 mL/min/{1.73_m2} — ABNORMAL LOW (ref >59)

## 2022-03-09 LAB — HEMOGLOBIN A1C
Est. average glucose Bld gHb Est-mCnc: 131 mg/dL
Hgb A1c MFr Bld: 6.2 % — ABNORMAL HIGH (ref 4.8–5.6)

## 2022-03-09 NOTE — Progress Notes (Signed)
Scheduled

## 2022-03-10 NOTE — Progress Notes (Signed)
Unable to reach patient by phone. Mychart massage sent.

## 2022-03-17 ENCOUNTER — Ambulatory Visit: Payer: Self-pay | Attending: Nurse Practitioner | Admitting: Nurse Practitioner

## 2022-03-17 ENCOUNTER — Other Ambulatory Visit: Payer: Self-pay

## 2022-03-17 ENCOUNTER — Encounter: Payer: Self-pay | Admitting: Nurse Practitioner

## 2022-03-17 VITALS — BP 115/63 | HR 58 | Temp 97.9°F | Wt 288.0 lb

## 2022-03-17 DIAGNOSIS — R600 Localized edema: Secondary | ICD-10-CM

## 2022-03-17 DIAGNOSIS — J40 Bronchitis, not specified as acute or chronic: Secondary | ICD-10-CM

## 2022-03-17 MED ORDER — AZITHROMYCIN 250 MG PO TABS
ORAL_TABLET | ORAL | 0 refills | Status: AC
  Filled 2022-03-17: qty 6, 5d supply, fill #0

## 2022-03-17 MED ORDER — PREDNISONE 20 MG PO TABS
40.0000 mg | ORAL_TABLET | Freq: Every day | ORAL | 0 refills | Status: AC
  Filled 2022-03-17: qty 10, 5d supply, fill #0

## 2022-03-17 MED ORDER — PROMETHAZINE-DM 6.25-15 MG/5ML PO SYRP
5.0000 mL | ORAL_SOLUTION | Freq: Four times a day (QID) | ORAL | 0 refills | Status: DC | PRN
  Filled 2022-03-17: qty 240, 12d supply, fill #0

## 2022-03-17 NOTE — Progress Notes (Signed)
F/u cellulitis- completed ATB  States he has been taking carvedilol 1 tablet BID  States he has been dealing with URI  Would like something for cough

## 2022-03-17 NOTE — Progress Notes (Signed)
Assessment & Plan:  Frank Love was seen today for leg swelling.  Diagnoses and all orders for this visit:  Leg edema, left -     Ambulatory referral to Vascular Surgery -     Brain natriuretic peptide  Bronchitis -     predniSONE (DELTASONE) 20 MG tablet; Take 2 tablets (40 mg total) by mouth daily with breakfast for 5 days. -     azithromycin (ZITHROMAX) 250 MG tablet; Take 2 tablets (500 mg total) by mouth daily for 1 day, THEN 1 tablet (250 mg total) daily for 4 days. -     promethazine-dextromethorphan (PROMETHAZINE-DM) 6.25-15 MG/5ML syrup; Take 5 mLs by mouth 4 (four) times daily as needed for cough.    Patient has been counseled on age-appropriate routine health concerns for screening and prevention. These are reviewed and up-to-date. Referrals have been placed accordingly. Immunizations are up-to-date or declined.    Subjective:   Chief Complaint  Patient presents with   Leg Swelling   HPI Frank Love 67 y.o. male presents to office today with persistent LLE edema  His edema was treated as a cellulitis last week 03-08-2022. Despite taking bactrim as prescribed there has not been much improvement in the degree of edema in his lower leg. He does have a history of CHF and is taking lasix as prescribed and denies and worsening shortness of breath. He is not a smoker. DVT was negative for clot. He has been unable to wear his compression sock on the left due to swelling. PLS SEE BELOW FOR BEFORE AND AFTER PHOTOS.  AFTER ABX     BEFORE     Acute Bronchitis: Patient presents for presents evaluation of productive cough and congestion . Symptoms began several days ago and are unchanged since that time.  Past history is significant for no history of pneumonia or bronchitis.    Review of Systems  Constitutional:  Negative for fever, malaise/fatigue and weight loss.  HENT: Negative.  Negative for nosebleeds.   Eyes: Negative.  Negative for blurred vision, double vision  and photophobia.  Respiratory:  Positive for cough and sputum production. Negative for shortness of breath and wheezing.   Cardiovascular:  Positive for leg swelling. Negative for chest pain and palpitations.  Gastrointestinal: Negative.  Negative for heartburn, nausea and vomiting.  Musculoskeletal: Negative.  Negative for myalgias.  Neurological: Negative.  Negative for dizziness, focal weakness, seizures and headaches.  Psychiatric/Behavioral: Negative.  Negative for suicidal ideas.     Past Medical History:  Diagnosis Date   Cancer ([place])    penile cancer   CHF (congestive heart failure) (HCC)    Chronic pain of right knee    Coronary artery disease    Hypertension    Phimosis     Past Surgical History:  Procedure Laterality Date   CIRCUMCISION N/A 10/12/2018   Procedure: CIRCUMCISION ADULT;  Surgeon: Franchot Gallo, MD;  [facility]: [facility];  Service: Urology;  Laterality: N/A;  45 mins   CO2 LASER APPLICATION N/A 123456   Procedure: CO2 LASER APPLICATION;  Surgeon: Franchot Gallo, MD;  [facility];  Service: Urology;  Laterality: N/A;  30 MINS   COLONOSCOPY     CORONARY ARTERY BYPASS GRAFT N/A 12/12/2018   Procedure: CORONARY ARTERY BYPASS GRAFTING (CABG) x four, using bilateral internal mammary arteries and right leg greater saphenous vein harvested endoscopically;  Surgeon: [name], MD;  Location: [place];  Service: Open Heart Surgery;  Laterality: N/A;   LEFT HEART CATH  AND CORONARY ANGIOGRAPHY N/A 11/28/2018   Procedure: LEFT HEART CATH AND CORONARY ANGIOGRAPHY;  Surgeon: Burnell Blanks, MD;  Location: [place] [facility];  Service: Cardiovascular;  Laterality: N/A;   MEDIASTINAL EXPLORATION N/A 12/12/2018   Procedure: Mediastinal Re-Exploration after bypass graft;  Surgeon: [name], MD;  Location: [place];  Service: Open Heart Surgery;  Laterality: N/A;   TEE WITHOUT CARDIOVERSION N/A 12/12/2018   Procedure: TRANSESOPHAGEAL ECHOCARDIOGRAM  (TEE);  Surgeon: [name], MD;  Location: [place];  Service: Open Heart Surgery;  Laterality: N/A;   TOOTH EXTRACTION     TOTAL HIP ARTHROPLASTY Right 03/08/2019   Procedure: RIGHT TOTAL HIP ARTHROPLASTY ANTERIOR APPROACH;  Surgeon: Mcarthur Rossetti, MD;  [facility];  Service: Orthopedics;  Laterality: Right;    Family History  Problem Relation Age of Onset   Diabetes Mother    Heart disease Mother     Social History Reviewed with no changes to be made today.   Outpatient Medications Prior to Visit  Medication Sig Dispense Refill   aspirin EC 81 MG tablet Take 1 tablet (81 mg total) by mouth daily. Swallow whole. 90 tablet 3   atorvastatin (LIPITOR) 40 MG tablet Take 1 tablet (40 mg total) by mouth daily at 6 PM. 90 tablet 30   carvedilol (COREG) 6.25 MG tablet Take 1.5 tablets (9.375 mg total) by mouth 2 (two) times daily. 180 tablet 3   dapagliflozin propanediol (FARXIGA) 10 MG TABS tablet TAKE 1 TABLET (10 MG TOTAL) BY MOUTH DAILY BEFORE BREAKFAST. 30 tablet 5   furosemide (LASIX) 40 MG tablet Take 1 tablet (40 mg total) by mouth daily. 30 tablet 3   nitroGLYCERIN (NITROSTAT) 0.4 MG SL tablet Place 1 tablet (0.4 mg total) under the tongue every 5 (five) minutes as needed for chest pain. NO more than 3 pills--if you have the need to take 2 pills or more call MD 30 tablet 3   sacubitril-valsartan (ENTRESTO) 97-103 MG Take 1 tablet by mouth 2 (two) times daily. 180 tablet 3   spironolactone (ALDACTONE) 25 MG tablet Take 1 tablet (25 mg total) by mouth daily. 30 tablet 6   No facility-administered medications prior to visit.    No Known Allergies     Objective:    BP 115/63 (BP Location: Left Arm, Patient Position: Sitting, Cuff Size: Large)   Pulse (!) 58   Temp 97.9 F (36.6 C) (Oral)   Wt 288 lb (130.6 kg)   SpO2 97%   BMI 36.00 kg/m  Wt Readings from Last 3 Encounters:  03/17/22 288 lb (130.6 kg)  03/08/22 293 lb (132.9 kg)  09/30/21 293 lb 9.6 oz  (133.2 kg)    Physical Exam Vitals and nursing note reviewed.  Constitutional:      Appearance: He is well-developed.  HENT:     Head: Normocephalic and atraumatic.  Cardiovascular:     Rate and Rhythm: Normal rate and regular rhythm.     Heart sounds: Normal heart sounds. No murmur heard.    No friction rub. No gallop.  Pulmonary:     Effort: Pulmonary effort is normal. No tachypnea or respiratory distress.     Breath sounds: Rhonchi present. No decreased breath sounds, wheezing or rales.  Chest:     Chest wall: No tenderness.  Abdominal:     General: Bowel sounds are normal.     Palpations: Abdomen is soft.  Musculoskeletal:        General: Swelling and tenderness  present. Normal range of motion.     Cervical back: Normal range of motion.     Left lower leg: Edema present.  Skin:    General: Skin is warm and dry.  Neurological:     Mental Status: He is alert and oriented to person, place, and time.     Coordination: Coordination normal.  Psychiatric:        Behavior: Behavior normal. Behavior is cooperative.        Thought Content: Thought content normal.        Judgment: Judgment normal.          Patient has been counseled extensively about nutrition and exercise as well as the importance of adherence with medications and regular follow-up. The patient was given clear instructions to go to ER or return to medical center if symptoms don't improve, worsen or new problems develop. The patient verbalized understanding.   Follow-up: Return in about 3 months (around 06/15/2022).   [name], FNP-BC [facility], [place]   03/17/2022, 8:50 PM

## 2022-03-18 ENCOUNTER — Other Ambulatory Visit: Payer: Self-pay

## 2022-03-18 LAB — BRAIN NATRIURETIC PEPTIDE: BNP: 186.2 pg/mL — ABNORMAL HIGH (ref 0.0–100.0)

## 2022-03-18 NOTE — Progress Notes (Signed)
Addendum completed.

## 2022-03-20 NOTE — Progress Notes (Unsigned)
[facility] OF [place]  ASSESSMENT / PLAN: Frank Love is a 67 y.o. male with chronic venous insufficiency of left lower extremity causing edema and skin changes (C4 disease).  Recommend compression and elevation for symptomatic relief. No evidence of DVT on recent duplex. Arterial exam reassuring today. Follow up in 3 months with venous duplex to review options for saphenous vein ablation.  CHIEF COMPLAINT: left leg swelling  HISTORY OF PRESENT ILLNESS: Frank Love is a 67 y.o. male The patient presents with a chief complaint of left leg swelling, which has been persistent for about a month or more. The patient reports that the left leg has always been slightly larger due to their heart condition, but the swelling increased significantly a month ago. Initially, the leg was much redder and warmer, but these symptoms have since subsided.  The patient has a history of heart failure, which they acknowledge contributes to fluid retention and exacerbates the leg swelling. They report that the left side is significantly worse than the right, with prominent veins visible over the foot.  The patient has used compression socks in the past but has not worn them for at least a month due to difficulty fitting them over the swollen leg.   Past Medical History:  Diagnosis Date   Cancer ([place])    penile cancer   CHF (congestive heart failure) (HCC)    Chronic pain of right knee    Coronary artery disease    Hypertension    Phimosis     Past Surgical History:  Procedure Laterality Date   CIRCUMCISION N/A 10/12/2018   Procedure: CIRCUMCISION ADULT;  Surgeon: Franchot Gallo, MD;  Location: [facility];  Service: Urology;  Laterality: N/A;  45 mins   CO2 LASER APPLICATION N/A [id number]   Procedure: CO2 LASER APPLICATION;  Surgeon: Franchot Gallo, MD;  Location: [facility];  Service: Urology;  Laterality: N/A;  30 MINS   COLONOSCOPY     CORONARY ARTERY BYPASS GRAFT N/A  12/12/2018   Procedure: CORONARY ARTERY BYPASS GRAFTING (CABG) x four, using bilateral internal mammary arteries and right leg greater saphenous vein harvested endoscopically;  Surgeon: [name], MD;  Location: [facility];  Service: Open Heart Surgery;  Laterality: N/A;   LEFT HEART CATH AND CORONARY ANGIOGRAPHY N/A 11/28/2018   Procedure: LEFT HEART CATH AND CORONARY ANGIOGRAPHY;  Surgeon: Burnell Blanks, MD;  Location: [facility];  Service: Cardiovascular;  Laterality: N/A;   MEDIASTINAL EXPLORATION N/A 12/12/2018   Procedure: Mediastinal Re-Exploration after bypass graft;  Surgeon: [name], MD;  Location: [facility];  Service: Open Heart Surgery;  Laterality: N/A;   TEE WITHOUT CARDIOVERSION N/A 12/12/2018   Procedure: TRANSESOPHAGEAL ECHOCARDIOGRAM (TEE);  Surgeon: [name], MD;  Location: [facility];  Service: Open Heart Surgery;  Laterality: N/A;   TOOTH EXTRACTION     TOTAL HIP ARTHROPLASTY Right 03/08/2019   Procedure: RIGHT TOTAL HIP ARTHROPLASTY ANTERIOR APPROACH;  Surgeon: Mcarthur Rossetti, MD;  Location: [facility];  Service: Orthopedics;  Laterality: Right;    Family History  Problem Relation Age of Onset   Diabetes Mother    Heart disease Mother     Social History   Socioeconomic History   Marital status: Married    Spouse name: Not on file   Number of children: Not on file   Years of education: Not on file   Highest education level: Not on file  Occupational History   Not on file  Tobacco Use   Smoking status: Never   Smokeless tobacco: Never  Vaping Use   Vaping Use: Never used  Substance and Sexual Activity   Alcohol use: No    Alcohol/week: 0.0 standard drinks of alcohol   Drug use: No   Sexual activity: Yes  Other Topics Concern   Not on file  Social History Narrative   Lives with wife.     Social Determinants of Health   Financial Resource Strain: High Risk (12/28/2021)   Overall Financial Resource Strain (CARDIA)     Difficulty of Paying Living Expenses: Hard  Food Insecurity: Not on file  Transportation Needs: Not on file  Physical Activity: Not on file  Stress: Not on file  Social Connections: Not on file  Intimate Partner Violence: Not on file    No Known Allergies  Current Outpatient Medications  Medication Sig Dispense Refill   aspirin EC 81 MG tablet Take 1 tablet (81 mg total) by mouth daily. Swallow whole. 90 tablet 3   atorvastatin (LIPITOR) 40 MG tablet Take 1 tablet (40 mg total) by mouth daily at 6 PM. 90 tablet 30   azithromycin (ZITHROMAX) 250 MG tablet Take 2 tablets (500 mg total) by mouth daily for 1 day, THEN 1 tablet (250 mg total) daily for 4 days. 6 tablet 0   carvedilol (COREG) 6.25 MG tablet Take 1.5 tablets (9.375 mg total) by mouth 2 (two) times daily. 180 tablet 3   dapagliflozin propanediol (FARXIGA) 10 MG TABS tablet TAKE 1 TABLET (10 MG TOTAL) BY MOUTH DAILY BEFORE BREAKFAST. 30 tablet 5   furosemide (LASIX) 40 MG tablet Take 1 tablet (40 mg total) by mouth daily. 30 tablet 3   nitroGLYCERIN (NITROSTAT) 0.4 MG SL tablet Place 1 tablet (0.4 mg total) under the tongue every 5 (five) minutes as needed for chest pain. NO more than 3 pills--if you have the need to take 2 pills or more call MD 30 tablet 3   predniSONE (DELTASONE) 20 MG tablet Take 2 tablets (40 mg total) by mouth daily with breakfast for 5 days. 10 tablet 0   promethazine-dextromethorphan (PROMETHAZINE-DM) 6.25-15 MG/5ML syrup Take 5 mLs by mouth 4 (four) times daily as needed for cough. 240 mL 0   sacubitril-valsartan (ENTRESTO) 97-103 MG Take 1 tablet by mouth 2 (two) times daily. 180 tablet 3   spironolactone (ALDACTONE) 25 MG tablet Take 1 tablet (25 mg total) by mouth daily. 30 tablet 6   No current facility-administered medications for this visit.    PHYSICAL EXAM Vitals:   03/21/22 0806  BP: 135/69  Pulse: (!) 58  Resp: 16  Temp: 98 F (36.7 C)  TempSrc: Temporal  SpO2: 98%  Weight: 289 lb  (131.1 kg)  Height: '6\' 1"'$  (1.854 m)   Elderly man in no distress Regular rate and rhythm Unlabored breathing Left leg 2+ edema. Mild erythema. Prominent reticular veins about the foot and ankle 1+ edema in RLE Brisk doppler flow in pedal arteries bilaterally  PERTINENT LABORATORY AND RADIOLOGIC DATA  Most recent CBC    Latest Ref Rng & Units 03/08/2022    4:35 PM 10/12/2020    9:28 AM 03/16/2020   12:30 PM  CBC  WBC 3.4 - 10.8 x10E3/uL 6.2  6.3  6.1   Hemoglobin 13.0 - 17.7 g/dL 13.0  13.2  12.4   Hematocrit 37.5 - 51.0 % 39.7  40.3  39.6   Platelets 150 - 450 x10E3/uL 251  176  231  Most recent CMP    Latest Ref Rng & Units 03/08/2022    4:35 PM 10/15/2021    9:05 AM 09/30/2021    9:05 AM  CMP  Glucose 70 - 99 mg/dL 129  113  103   BUN 8 - 27 mg/dL 29  25  32   Creatinine 0.76 - 1.27 mg/dL 1.40  1.25  1.42   Sodium 134 - 144 mmol/L 139  138  139   Potassium 3.5 - 5.2 mmol/L 4.3  3.7  3.8   Chloride 96 - 106 mmol/L 103  108  110   CO2 20 - 29 mmol/L '20  22  22   '$ Calcium 8.6 - 10.2 mg/dL 8.6  8.5  8.2   Total Protein 6.0 - 8.5 g/dL 7.0     Total Bilirubin 0.0 - 1.2 mg/dL 0.6     Alkaline Phos 44 - 121 IU/L 74     AST 0 - 40 IU/L 17     ALT 0 - 44 IU/L 15       Renal function Estimated Creatinine Clearance: 73.7 mL/min (A) (by C-G formula based on SCr of 1.4 mg/dL (H)).  HbA1c, POC (prediabetic range) (%)  Date Value  05/07/2021 5.8   Hgb A1c MFr Bld (%)  Date Value  03/08/2022 6.2 (H)    LDL Calculated  Date Value Ref Range Status  08/03/2018 116 (H) 0 - 99 mg/dL Final   LDL Cholesterol  Date Value Ref Range Status  04/29/2021 48 0 - 99 mg/dL Final    Comment:           Total Cholesterol/HDL:CHD Risk Coronary Heart Disease Risk Table                     Men   Women  1/2 Average Risk   3.4   3.3  Average Risk       5.0   4.4  2 X Average Risk   9.6   7.1  3 X Average Risk  23.4   11.0        Use the calculated Patient Ratio above and the CHD  Risk Table to determine the patient's CHD Risk.        ATP III CLASSIFICATION (LDL):  <100     mg/dL   Optimal  100-129  mg/dL   Near or Above                    Optimal  130-159  mg/dL   Borderline  160-189  mg/dL   High  >190     mg/dL   Very High Performed at [facility] [address] [place], [place] [address]     Left venous duplex  Significant pitting edema in the calf; posterior tibial and peroneal veins  were not well-visualized but do appear widely patent in visualized  segments.     Summary:  RIGHT:  - No evidence of common femoral vein obstruction.    LEFT:  - No evidence of deep vein thrombosis in the lower extremity. No indirect  evidence of obstruction proximal to the inguinal ligament.  - No cystic structure found in the popliteal fossa.       Yevonne Aline. Stanford Breed, MD FACS [facility] Phone Number: [phone] 03/21/2022 8:26 AM   Total time spent on preparing this encounter including chart review, data review, collecting history, examining the patient, coordinating care for  this new patient, 60 minutes.  Portions of this report may have been transcribed using voice recognition software.  Every effort has been made to ensure accuracy; however, inadvertent computerized transcription errors may still be present.

## 2022-03-21 ENCOUNTER — Encounter: Payer: Self-pay | Admitting: Vascular Surgery

## 2022-03-21 ENCOUNTER — Ambulatory Visit: Payer: Self-pay

## 2022-03-21 ENCOUNTER — Ambulatory Visit (INDEPENDENT_AMBULATORY_CARE_PROVIDER_SITE_OTHER): Payer: Self-pay | Admitting: Vascular Surgery

## 2022-03-21 VITALS — BP 135/69 | HR 58 | Temp 98.0°F | Resp 16 | Ht 73.0 in | Wt 289.0 lb

## 2022-03-21 DIAGNOSIS — I872 Venous insufficiency (chronic) (peripheral): Secondary | ICD-10-CM

## 2022-03-21 NOTE — Telephone Encounter (Signed)
He can stop all the medications at this time.

## 2022-03-21 NOTE — Telephone Encounter (Signed)
Chief Complaint: Medication reaction Symptoms: blurred vision Frequency: Friday evening and more noticeable on Satruday Pertinent Negatives: Patient denies other symptoms Disposition: '[]'$ ED /'[]'$ Urgent Care (no appt availability in office) / '[]'$ Appointment(In office/virtual)/ '[]'$  [facility]/ '[]'$ Home Care/ '[]'$ Refused Recommended Disposition /'[]'$ [facility]/ '[x]'$  Follow-up with PCP Additional Notes: Patient was prescribed azithromycin, prednisone, promethazine DM last OV on 03/17/22. He says he took doses on 03/18/22 morning and noticed a little blurry vision that evening, but it was more noticeable on 03/19/22 and is still having blurred vision. He has 1 more day of both medications to take in the morning. Advised I will send this to [facility] and someone will call with her recommendation.   Reason for Disposition  [1] Caller has NON-URGENT medicine question about med that PCP prescribed AND [2] triager unable to answer question  Answer Assessment - Initial Assessment Questions 1. NAME of MEDICINE: "What medicine(s) are you calling about?"     Predinisone, azithromycin, promethazine DM 2. QUESTION: "What is your question?" (e.g., double dose of medicine, side effect)     Side effect blurry vision 3. PRESCRIBER: "Who prescribed the medicine?" Reason: if prescribed by specialist, call should be referred to that group.     Zelda Fleming 4. SYMPTOMS: "Do you have any symptoms?" If Yes, ask: "What symptoms are you having?"  "How bad are the symptoms (e.g., mild, moderate, severe)     Blurred vision on day 1 of taking meds  Protocols used: Medication Question Call-A-AH

## 2022-03-22 ENCOUNTER — Telehealth: Payer: Self-pay

## 2022-03-22 NOTE — Telephone Encounter (Signed)
Patient identified by name and date of birth.   Patient aware of results and voiced understanding.

## 2022-03-22 NOTE — Telephone Encounter (Signed)
Pt called to clarify provider's message. Shared provider's note.  [name], NP  to Gasper Lloyd, [facility]      03/21/22  9:37 PM Note He can stop all the medications at this time.      No further questions.

## 2022-03-23 ENCOUNTER — Ambulatory Visit: Payer: Self-pay | Admitting: *Deleted

## 2022-03-23 NOTE — Telephone Encounter (Signed)
Summary: rx concern   The patient shares that they are concerned related to the side effects of their recently prescribed medications  The patient shares that they experience occasional fogginess in the mornings around 8:30 AM - 10 AM  The patient is uncertain of which specific medication may be causing the issue  The patient would like to speak with a member of clinical staff when possible regarding their concern  Please contact further when available      Chief Complaint: "fogginess" dizziness at times Symptoms: dizziness at times throughout day . Was happening less often now different times throughout the day. Can be walking and need to stop 20-30 seconds and dizziness goes away. Blurred vision reported after taken cough medication and now stopped and blurred vision decreased but reports worse in right eye than left.  Frequency: 1 month  Pertinent Negatives: Patient denies chest pain no difficulty breathing no headache, no weakness on either side of body. No N/T.  Disposition: '[]'$ ED /'[]'$ Urgent Care (no appt availability in office) / '[]'$ Appointment(In office/virtual)/ '[]'$  [facility]/ '[]'$ Home Care/ '[]'$ Refused Recommended Disposition /'[]'$ [facility]/ '[x]'$  Follow-up with PCP Additional Notes:   No available appt within 3 days. Recommended UC or ED for worsening sx. Reports he has been taking coreg 1 tablet 6.25 mg twice daily instead of  9.375 mg per PCP recommendations. Requesting referral to eye dr. If possible. Does not know who to see with no insurance. Please advise if appt can be scheduled in 3 days .      Reason for Disposition  [1] MODERATE dizziness (e.g., interferes with normal activities) AND [2] has been evaluated by doctor (or NP/PA) for this  Answer Assessment - Initial Assessment Questions 1. SYMPTOM: "What is the main symptom you are concerned about?" (e.g., weakness, numbness)     "Fogginess" at times throughout the day dizziness 2. ONSET: "When did  this start?" (minutes, hours, days; while sleeping)     1 month 3. LAST NORMAL: "When was the last time you (the patient) were normal (no symptoms)?"     Na  4. PATTERN "Does this come and go, or has it been constant since it started?"  "Is it present now?"     Comes and goes  5. CARDIAC SYMPTOMS: "Have you had any of the following symptoms: chest pain, difficulty breathing, palpitations?"     Denies  6. NEUROLOGIC SYMPTOMS: "Have you had any of the following symptoms: headache, dizziness, vision loss, double vision, changes in speech, unsteady on your feet?"     Dizziness. Vision blurry at times. But stopped new meds and now better. Reports right eye more blurred than left eye. Balance issues at times and feels he must stop moving to decrease dizziness 7. OTHER SYMPTOMS: "Do you have any other symptoms?"     na 8. PREGNANCY: "Is there any chance you are pregnant?" "When was your last menstrual period?"     na  Answer Assessment - Initial Assessment Questions 1. DESCRIPTION: "Describe your dizziness."     Dizziness , "fogginess' 2. LIGHTHEADED: "Do you feel lightheaded?" (e.g., somewhat faint, woozy, weak upon standing)     Can be walking and feel dizziness  3. VERTIGO: "Do you feel like either you or the room is spinning or tilting?" (i.e. vertigo)     na 4. SEVERITY: "How bad is it?"  "Do you feel like you are going to faint?" "Can you stand and walk?"   - MILD: Feels slightly dizzy, but walking  normally.   - MODERATE: Feels unsteady when walking, but not falling; interferes with normal activities (e.g., school, work).   - SEVERE: Unable to walk without falling, or requires assistance to walk without falling; feels like passing out now.      Reports slight dizziness comes and goes . After stopping movement 20-30 seconds feels better. 5. ONSET:  "When did the dizziness begin?"     Approx 1 month ago  6. AGGRAVATING FACTORS: "Does anything make it worse?" (e.g., standing, change in head  position)     unknown 7. HEART RATE: "Can you tell me your heart rate?" "How many beats in 15 seconds?"  (Note: not all patients can do this)       na 8. CAUSE: "What do you think is causing the dizziness?"     Medication  9. RECURRENT SYMPTOM: "Have you had dizziness before?" If Yes, ask: "When was the last time?" "What happened that time?"     Yes decreased coreg to 1 pill 2 times a day instead of 1 1/2 pill . 10. OTHER SYMPTOMS: "Do you have any other symptoms?" (e.g., fever, chest pain, vomiting, diarrhea, bleeding)       Blurred vision at times. Decreased after stopping medication for cough 11. PREGNANCY: "Is there any chance you are pregnant?" "When was your last menstrual period?"       na  Protocols used: Neurologic Deficit-A-AH, Dizziness - Lightheadedness-A-AH

## 2022-03-24 ENCOUNTER — Other Ambulatory Visit: Payer: Self-pay

## 2022-03-24 DIAGNOSIS — I872 Venous insufficiency (chronic) (peripheral): Secondary | ICD-10-CM

## 2022-03-25 ENCOUNTER — Other Ambulatory Visit: Payer: Self-pay | Admitting: Nurse Practitioner

## 2022-03-25 DIAGNOSIS — H538 Other visual disturbances: Secondary | ICD-10-CM

## 2022-03-25 NOTE — Telephone Encounter (Signed)
Message left to return call.  Can you please schedule this patient ?

## 2022-03-25 NOTE — Telephone Encounter (Signed)
Referred to eye doctor. Please give him a few eye doctors off the list that he can call. Pls schedule him with mobile unit for dizziness

## 2022-03-28 ENCOUNTER — Other Ambulatory Visit (HOSPITAL_COMMUNITY): Payer: Self-pay

## 2022-03-28 ENCOUNTER — Telehealth: Payer: Self-pay

## 2022-03-28 NOTE — Telephone Encounter (Signed)
Called to set up appointment with [facility] on 3.5.24, for dizziness, unable to reach patient. Vm was left to call back.

## 2022-03-30 ENCOUNTER — Other Ambulatory Visit (HOSPITAL_COMMUNITY): Payer: Self-pay

## 2022-03-30 ENCOUNTER — Ambulatory Visit: Payer: Self-pay | Admitting: Physician Assistant

## 2022-03-30 ENCOUNTER — Encounter: Payer: Self-pay | Admitting: Physician Assistant

## 2022-03-30 VITALS — BP 113/62 | HR 56 | Ht 75.0 in | Wt 289.0 lb

## 2022-03-30 DIAGNOSIS — R001 Bradycardia, unspecified: Secondary | ICD-10-CM

## 2022-03-30 DIAGNOSIS — R42 Dizziness and giddiness: Secondary | ICD-10-CM

## 2022-03-30 NOTE — Patient Instructions (Addendum)
You are going to start taking one half of your carvedilol tablet twice daily instead of a whole tablet twice daily.  You will start this tonight.  I do encourage you to check your blood pressure and pulse on a daily basis, to check your pulse, you will use your index finger and middle finger and lightly feel for the pulse on your neck, using a second hand on a clock count the number of pulses that occur during a 10-second.  And then take that number x 6 and that is your current pulse.  I encourage you to keep a written log of your blood pressure and pulse readings and have it available for when you see cardiology.  Your appointment with them is not until April 20, 2022, please feel free to return to the mobile unit prior to that if needed.  I hope that you feel better soon, please let us know if there is anything else we can do for you  Kennieth Rad, PA-C Physician Assistant [place] [email or web address]  Bradycardia, Adult Bradycardia is a slower-than-normal heartbeat. A normal resting heart rate for an adult ranges from 60 to 100 beats per minute. With bradycardia, the resting heart rate is less than 60 beats per minute. Bradycardia can prevent enough oxygen from reaching certain areas of your body when you are active. It can be serious if it keeps enough oxygen from reaching your brain and other parts of your body. Bradycardia is not a problem for everyone. For some healthy adults, a slow resting heart rate is normal. What are the causes? This condition may be caused by: A problem with the heart, including: A problem with the heart's electrical system, such as a heart block. With a heart block, electrical signals between the chambers of the heart are partially or completely blocked, so they are not able to work as they should. A problem with the heart's natural pacemaker (sinus node). Heart disease. A heart attack. Heart damage. Lyme  disease. A heart infection. A heart condition that is present at birth (congenital heart defect). Certain medicines that treat heart conditions. Certain conditions, such as hypothyroidism and obstructive sleep apnea. Problems with the balance of chemicals and other substances, like potassium, in the blood. Trauma. Radiation therapy. What increases the risk? You are more likely to develop this condition if you: Are age 58 or older. Have high blood pressure (hypertension), high cholesterol (hyperlipidemia), or diabetes. Drink heavily, use tobacco or nicotine products, or use drugs. What are the signs or symptoms? Symptoms of this condition include: Light-headedness. Feeling faint or fainting. Fatigue and weakness. Trouble with activity or exercise. Shortness of breath. Chest pain (angina). Drowsiness. Confusion. Dizziness. How is this diagnosed? This condition may be diagnosed based on: Your symptoms. Your medical history. A physical exam. During the exam, your health care provider will listen to your heartbeat and check your pulse. To confirm the diagnosis, your health care provider may order tests, such as: Blood tests. An electrocardiogram (ECG). This test records the heart's electrical activity. The test can show how fast your heart is beating and whether the heartbeat is steady. A test in which you wear a portable device (event recorder or Holter monitor) to record your heart's electrical activity while you go about your day. An exercise test. How is this treated? Treatment for this condition depends on the cause of the condition and how severe your symptoms are. Treatment may involve: Treatment of the underlying condition. Changing your medicines or  how much medicine you take. Having a small, battery-operated device called a pacemaker implanted under the skin. When bradycardia occurs, this device can be used to increase your heart rate and help your heart beat in a regular  rhythm. Follow these instructions at home: Lifestyle Manage any health conditions that contribute to bradycardia as told by your health care provider. Follow a heart-healthy diet. A nutrition specialist (dietitian) can help educate you about healthy food options and changes. Follow an exercise program that is approved by your health care provider. Maintain a healthy weight. Try to reduce or manage your stress, such as with yoga or meditation. If you need help reducing stress, ask your health care provider. Do not use any products that contain nicotine or tobacco. These products include cigarettes, chewing tobacco, and vaping devices, such as e-cigarettes. If you need help quitting, ask your health care provider. Do not use illegal drugs. Alcohol use If you drink alcohol: Limit how much you have to: 0-1 drink a day for women who are not pregnant. 0-2 drinks a day for men. Know how much alcohol is in a drink. In the [place] one drink equals one 12 oz bottle of beer (355 mL), one 5 oz glass of wine (148 mL), or one 1 oz glass of hard liquor (44 mL). General instructions Take over-the-counter and prescription medicines only as told by your health care provider. Keep all follow-up visits. This is important. How is this prevented? In some cases, bradycardia may be prevented by: Treating underlying medical problems. Stopping behaviors or medicines that can trigger the condition. Contact a health care provider if: You feel light-headed or dizzy. You almost faint. You feel weak or are easily fatigued during physical activity. You experience confusion or have memory problems. Get help right away if: You faint. You have chest pains or an irregular heartbeat (palpitations). You have trouble breathing. These symptoms may represent a serious problem that is an emergency. Do not wait to see if the symptoms will go away. Get medical help right away. Call your local emergency services (911 in the  [place] Do not drive yourself to the hospital. Summary Bradycardia is a slower-than-normal heartbeat. With bradycardia, the resting heart rate is less than 60 beats per minute. Treatment for this condition depends on the cause. Manage any health conditions that contribute to bradycardia as told by your health care provider. Do not use any products that contain nicotine or tobacco. These products include cigarettes, chewing tobacco, and vaping devices, such as e-cigarettes. Keep all follow-up visits. This is important. This information is not intended to replace advice given to you by your health care provider. Make sure you discuss any questions you have with your health care provider. Document Revised: 05/03/2020 Document Reviewed: 05/03/2020 Elsevier Patient Education  [place].

## 2022-03-30 NOTE — Progress Notes (Signed)
Established Patient Office Visit  Subjective   Patient ID: Frank Love, male    DOB: Oct 01, 1955  Age: 67 y.o. MRN: [id number]  Chief Complaint  Patient presents with   Dizziness    Patient thinks it could be his bp dropping. Taking carvedilol 6.25 twice daily=1 tablet daily ,not taking the 1.5 tablets      States that he has been having episodes of dizziness for at least the past month.  States that the episodes are random, states sometimes it will occur while he is dry, states that it will also occur upon standing or if he has been walking around for short time.  States that they tend to pass quickly but states that he "just does not feel right".  Denies syncope  States that he does not check his blood pressure at home, does not have access to a cuff.  States that he has been taking one tablet of carvedilol 6.25 mg twice daily instead of prescribed 1.5 tablets.  States that he actually did not realize he was supposed to be taking 1-1/2 tablets twice daily and had only done it for approximately 1 week approximately 6 weeks ago.  States that he has been compliant to all his medications.  States that he does try to follow a low-sodium diet but does admit to eating more of a moderate sodium diet.      Past Medical History:  Diagnosis Date   Cancer ([place])    penile cancer   CHF (congestive heart failure) ([facility])    Chronic pain of right knee    Coronary artery disease    Hypertension    Phimosis    Social History   Socioeconomic History   Marital status: Married    Spouse name: Not on file   Number of children: Not on file   Years of education: Not on file   Highest education level: Not on file  Occupational History   Not on file  Tobacco Use   Smoking status: Never   Smokeless tobacco: Never  Vaping Use   Vaping Use: Never used  Substance and Sexual Activity   Alcohol use: No    Alcohol/week: 0.0 standard drinks of alcohol   Drug use: No   Sexual activity: Yes   Other Topics Concern   Not on file  Social History Narrative   Lives with wife.     Social Determinants of Health   Financial Resource Strain: High Risk (12/28/2021)   Overall Financial Resource Strain (CARDIA)    Difficulty of Paying Living Expenses: Hard  Food Insecurity: Not on file  Transportation Needs: Not on file  Physical Activity: Not on file  Stress: Not on file  Social Connections: Not on file  Intimate Partner Violence: Not on file   Family History  Problem Relation Age of Onset   Diabetes Mother    Heart disease Mother    No Known Allergies  Review of Systems  Constitutional: Negative.   HENT: Negative.    Eyes: Negative.   Respiratory:  Negative for shortness of breath.   Cardiovascular:  Negative for chest pain.  Gastrointestinal: Negative.   Genitourinary: Negative.   Musculoskeletal: Negative.   Skin: Negative.   Neurological:  Positive for dizziness. Negative for loss of consciousness, weakness and headaches.  Endo/Heme/Allergies: Negative.   Psychiatric/Behavioral: Negative.        Objective:     There were no vitals taken for this visit. BP Readings from Last 3 Encounters:  03/21/22 135/69  03/17/22 115/63  03/08/22 109/63   Wt Readings from Last 3 Encounters:  03/30/22 289 lb (131.1 kg)  03/21/22 289 lb (131.1 kg)  03/17/22 288 lb (130.6 kg)      Physical Exam Vitals and nursing note reviewed.  Constitutional:      Appearance: Normal appearance. He is obese.  HENT:     Head: Normocephalic and atraumatic.     Right Ear: External ear normal.     Left Ear: External ear normal.     Nose: Nose normal.     Mouth/Throat:     Mouth: Mucous membranes are moist.     Pharynx: Oropharynx is clear.  Eyes:     Extraocular Movements: Extraocular movements intact.     Conjunctiva/sclera: Conjunctivae normal.     Pupils: Pupils are equal, round, and reactive to light.  Cardiovascular:     Rate and Rhythm: Regular rhythm. Bradycardia  present.     Pulses: Normal pulses.     Heart sounds: Normal heart sounds.  Pulmonary:     Effort: Pulmonary effort is normal.     Breath sounds: Normal breath sounds.  Musculoskeletal:        General: Normal range of motion.     Cervical back: Normal range of motion and neck supple.  Skin:    General: Skin is warm and dry.  Neurological:     General: No focal deficit present.     Mental Status: He is alert and oriented to person, place, and time.  Psychiatric:        Mood and Affect: Mood normal.        Behavior: Behavior normal.        Thought Content: Thought content normal.        Judgment: Judgment normal.        Assessment & Plan:   Problem List Items Addressed This Visit   None 1. Dizziness Patient had labs completed since having episodes of dizziness, kidney function is slightly decreased, heart failure numbers maintained.  Patient is taking Entresto, Coreg, furosemide, and spironolactone.  Patient was last seen at cardiology heart failure clinic in September 2023 was due for 60-monthfollow-up, however that has not occurred yet.  Encourage patient to reduce carvedilol 1/2 tablet twice daily, check blood pressure and pulse at home, keep a written log and have available for all follow-ups.  Heart failure clinic was contacted and patient was given appointment to follow-up on April 20, 2022.  Patient strongly encouraged to return to mobile unit if needed prior to that follow-up with heart failure clinic.  Patient understands and agrees.  Patient education given on supportive care, red flags given for prompt reevaluation  2. Bradycardia    I have reviewed the patient's medical history (PMH, PSH, Social History, Family History, Medications, and allergies) , and have been updated if relevant. I spent 30 minutes reviewing chart and  face to face time with patient.     No follow-ups on file.    CLoraine GripMayers, PA-C

## 2022-04-12 ENCOUNTER — Other Ambulatory Visit (HOSPITAL_COMMUNITY): Payer: Self-pay

## 2022-04-12 MED FILL — Atorvastatin Calcium Tab 40 MG (Base Equivalent): ORAL | 30 days supply | Qty: 30 | Fill #8 | Status: AC

## 2022-04-15 NOTE — Progress Notes (Signed)
PCP:  [name], NP  Cardiologist:  [name], MD HF Cardiology: Dr Aundra Dubin    HPI: Frank Love is a 67 y.o. male with a history of HTN, LE edema, and elevated BNP who returns for followup of CHF and CAD.   He initially was seen by PCP in 5/20 with elevated BNP and was started on lasix with cardiology referral, lasix was subsequently increased to 40 mg bid due to ongoing LE edema. He underwent cardiac evaluation for right hip surgery in September with continued symptoms of LE edema on lasix 40 bid. Echo in 5/42 showed systolic HF with EF 70-62%. He was started on entresto, metoprolol at this time.   He underwent cath 11/20 which showed multivessel disease, had CABGx4 12/12/18.  Repeat echo done post-CABG 12/17/18 showed severely decreased right ventricular systolic function with severe enlargement in addition to small pericardial effusion, otherwise similar to previous. VQ scan was negative for PE. Transferred to CIR for rehab.   He had right THR done in 3/76 with no complications.   Echo repeated 03/2019, EF 35-40%, mild LVH, mild LV dilation, moderately decreased RV systolic function, normal RV size.   Admitted to [facility] 11/19-12/10/21 for COVID-19 viral pneumonia requiring up to 30 L of HFNC.  Received remdesivir x 5 days, steroid taper, tocilizumab/baricitinib. Hospitalization complicated by development of severe orthostatic hypotension and thrombocytopenia. Echo 12/14/19 with EF 45-50%. Heart Failure team consulted for on-going hypotension and bradycardia. Beta-blocker held, midodrine 15 mg tid started, and abdominal binder and TED hose applied. Patient discharged home off all GDMT medications due to hypotension and bradycardia.   Echo in 3/22 showed EF 35-40%, diffuse hypokinesis, moderately decreased RV systolic function with normal RV size.  Technically difficult study.  cMRI (9/22) showed LVEF 33%, RVEF 30%, subendocardial LGE suggestive of coronary disease.  Today he  returns for HF follow up. Overall feeling fine. He lives on the 3rd floor apartment and can walk up 3 flights of stairs carrying his groceries without dyspnea. He has some LE swelling. Denies palpitations, CP, dizziness, abnormal bleeding or PND/Orthopnea. Appetite ok. No fever or chills. Weight at home 295 pounds. Taking all medications. He has seen EP and decided against ICD.  ECG (personally reviewed): none ordered today.   Labs (2/21): K 4.7, creatinine 1.07, LDL 61 Labs (3/21): K 4.3, creatinine 1.05, digoxin 0.9 Labs (5/21): K 4.3, creatinine 1.25  Labs (12/21): K 4.2 => 3.5, creatinine 1.17 => 0.91, plts 60K Labs (2/22): LDL 113, HDL 40 Labs (3/22): K 3.9, creatinine 1.12, BNP 272 Labs (7/22): LDL 59, HDL 41, K 3.7, creatinine 1.23 Labs (1/23): K 3.8, creatinine 1.17 Labs (4/23): K 3.9, creatinine 1.32, LDL 48, HDL 33  PMH: 1. OA: s/p right THR.  2. HTN 3. Carotid stenosis: Carotid dopplers (11/20) with 40-59% RICA stenosis.  - Carotid dopplers (12/21)  right ICA 40-59% stenosis, left ICA mild stenosis.  - Carotid dopplers (1/23): RICA 40-59% 4. CAD: LHC in 11/20 with totally occluded OM, totally occluded mid LAD, totally occluded mid RCA.  - CABG 11/20 with LIMA-LAD, RIMA-PDA, SVG-OM1, SVG-AM 5. Chronic systolic CHF: Ischemic cardiomyopathy.  - Echo (9/20): EF 30-35%, mildly decreased RV systolic function.  - Echo (11/20): EF 30-35%, severely dilated RV with severely decreased systolic function. - Echo (3/21): EF 35-40%, mild LV dilation with mild LVH, moderately decreased RV systolic function with normal RV size, PASP 33 mmHg.   - Echo (11/21): LVEF improved to 45-50%. RV mildly reduced  -  Echo (3/22): EF 35-40%, diffuse hypokinesis, moderately decreased RV systolic function with normal RV size.   - cMRI (9/22) showed LVEF 33%, RVEF 30%, subendocardial LGE suggestive of coronary disease. 6. V/Q scan negative for PE in 11/20.  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  8. COVID-19  11/21  Past Surgical History:  Procedure Laterality Date   CIRCUMCISION N/A 10/12/2018   Procedure: CIRCUMCISION ADULT;  Surgeon: Franchot Gallo, MD;  Location: [facility];  Service: Urology;  Laterality: N/A;  45 mins   CO2 LASER APPLICATION N/A 123456   Procedure: CO2 LASER APPLICATION;  Surgeon: Franchot Gallo, MD;  Location: [facility];  Service: Urology;  Laterality: N/A;  30 MINS   COLONOSCOPY     CORONARY ARTERY BYPASS GRAFT N/A 12/12/2018   Procedure: CORONARY ARTERY BYPASS GRAFTING (CABG) x four, using bilateral internal mammary arteries and right leg greater saphenous vein harvested endoscopically;  Surgeon: [name], MD;  Location: [facility];  Service: Open Heart Surgery;  Laterality: N/A;   LEFT HEART CATH AND CORONARY ANGIOGRAPHY N/A 11/28/2018   Procedure: LEFT HEART CATH AND CORONARY ANGIOGRAPHY;  Surgeon: Burnell Blanks, MD;  Location: [facility];  Service: Cardiovascular;  Laterality: N/A;   MEDIASTINAL EXPLORATION N/A 12/12/2018   Procedure: Mediastinal Re-Exploration after bypass graft;  Surgeon: [name], MD;  Location: [facility];  Service: Open Heart Surgery;  Laterality: N/A;   TEE WITHOUT CARDIOVERSION N/A 12/12/2018   Procedure: TRANSESOPHAGEAL ECHOCARDIOGRAM (TEE);  Surgeon: [name], MD;  Location: [facility];  Service: Open Heart Surgery;  Laterality: N/A;   TOOTH EXTRACTION     TOTAL HIP ARTHROPLASTY Right 03/08/2019   Procedure: RIGHT TOTAL HIP ARTHROPLASTY ANTERIOR APPROACH;  Surgeon: Mcarthur Rossetti, MD;  Location: [facility];  Service: Orthopedics;  Laterality: Right;   Current Outpatient Medications  Medication Sig Dispense Refill   aspirin EC 81 MG tablet Take 1 tablet (81 mg total) by mouth daily. Swallow whole. 90 tablet 3   atorvastatin (LIPITOR) 40 MG tablet Take 1 tablet (40 mg total) by mouth daily at 6 PM. 90 tablet 30   carvedilol (COREG) 6.25 MG tablet Take 1.5 tablets (9.375 mg total) by mouth 2 (two) times daily.  (Patient taking differently: Take 9.375 mg by mouth 2 (two) times daily. 6.25 mg twice daily) 180 tablet 3   dapagliflozin propanediol (FARXIGA) 10 MG TABS tablet TAKE 1 TABLET (10 MG TOTAL) BY MOUTH DAILY BEFORE BREAKFAST. 30 tablet 5   furosemide (LASIX) 40 MG tablet Take 1 tablet (40 mg total) by mouth daily. 30 tablet 3   nitroGLYCERIN (NITROSTAT) 0.4 MG SL tablet Place 1 tablet (0.4 mg total) under the tongue every 5 (five) minutes as needed for chest pain. NO more than 3 pills--if you have the need to take 2 pills or more call MD 30 tablet 3   promethazine-dextromethorphan (PROMETHAZINE-DM) 6.25-15 MG/5ML syrup Take 5 mLs by mouth 4 (four) times daily as needed for cough. 240 mL 0   sacubitril-valsartan (ENTRESTO) 97-103 MG Take 1 tablet by mouth 2 (two) times daily. 180 tablet 3   spironolactone (ALDACTONE) 25 MG tablet Take 1 tablet (25 mg total) by mouth daily. 30 tablet 6   No current facility-administered medications for this visit.   Allergies:   Patient has no known allergies.   Social History:  The patient  reports that he has never smoked. He has never used smokeless tobacco. He reports that he does not drink alcohol and does not use drugs.  Family History:  The patient's family history includes Diabetes in his mother; Heart disease in his mother.   ROS:  Please see the history of present illness.   All other systems are personally reviewed and negative.   Exam:  There were no vitals taken for this visit.   Physical Exam  General:  NAD. No resp difficulty, walked into clinic HEENT: Normal Neck: Supple. No JVD. Carotids 2+ bilat; no bruits. No lymphadenopathy or thryomegaly appreciated. Cor: PMI nondisplaced. Regular rate & rhythm. No rubs, gallops or murmurs. Lungs: Faint crackles RLL Abdomen: Obese, nontender, nondistended. No hepatosplenomegaly. No bruits or masses. Good bowel sounds. Extremities: No cyanosis, clubbing, rash, bilateral pedal edema Neuro: Alert &  oriented x 3, cranial nerves grossly intact. Moves all 4 extremities w/o difficulty. Affect pleasant.  Recent Labs: 03/08/2022: ALT 15; BUN 29; Creatinine, Ser 1.40; Hemoglobin 13.0; Platelets 251; Potassium 4.3; Sodium 139 03/17/2022: BNP 186.2  Personally reviewed   Wt Readings from Last 3 Encounters:  03/30/22 131.1 kg (289 lb)  03/21/22 131.1 kg (289 lb)  03/17/22 130.6 kg (288 lb)   Assessment/Plan 1. CAD: S/p CABG in 11/20. No chest pain.  - Continue ASA 81 mg daily.  - Continue atorvastatin, good lipids 4/23. 2. Chronic systolic CHF: Ischemic cardiomyopathy.  Pre-CABG echo with EF 30-35%, mild RV dysfunction.  Post-CABG echo (11/20) with EF 30-35%, severe RV systolic dysfunction.  V/Q scan done, no evidence for PE. Hypotensive post-CABG requiring pressors and then midodrine, but improved over time.  Echo repeated 3/21, EF 35-40% with moderate RV dysfunction, GIIDD. Cardiac MRI was recommended to quantify EF more exactly for purposes of ICD determination, however pt was adamant that he did not want an ICD and declined cMRI. Echo repeated 11/21 LVEF improved to 45-50%, RV mildly reduced. Echo in 3/22 was a technically difficult study, LV EF around 35-40% with moderate RV dysfunction. Cardiac MRI in 9/22 showed showed LVEF 33%, RVEF 30%, subendocardial LGE suggestive of coronary disease.  NYHA class I-II symptoms.  He is not volume overloaded on exam, weight is up. - Advised he wear his compression hose. - Continue Lasix 40 mg daily. BMET/BNP today.  - Continue Farxiga 10 mg daily.  - Continue spironolactone 25 mg daily.    - Continue Entresto 97/103 mg bid.   - Continue Coreg 9.375 mg bid.  - EF is in range for ICD, narrow QRS so not CRT candidate. He is not interested in getting ICD (has seen EP).  3. Carotid stenosis: Carotid dopplers (1/23) right ICA 40-59% stenosis, left ICA are consistent with a 1-39% stenosis.  - Repeat in 1 year. - Continue ASA and statin.   Follow up 4 months  with Dr. Aundra Dubin.  Signed, Rafael Bihari, FNP  04/15/2022   [facility] [address] [facility] [place] [address] [phone] (office) [phone] (fax)

## 2022-04-20 ENCOUNTER — Encounter (HOSPITAL_COMMUNITY): Payer: Self-pay

## 2022-04-20 ENCOUNTER — Ambulatory Visit (HOSPITAL_COMMUNITY)
Admission: RE | Admit: 2022-04-20 | Discharge: 2022-04-20 | Disposition: A | Discharge status: Home / Self Care | Payer: Self-pay | Source: Ambulatory Visit | Attending: Family Medicine | Admitting: Family Medicine

## 2022-04-20 ENCOUNTER — Other Ambulatory Visit: Payer: Self-pay

## 2022-04-20 VITALS — BP 110/80 | HR 62 | Wt 293.0 lb

## 2022-04-20 DIAGNOSIS — Z7982 Long term (current) use of aspirin: Secondary | ICD-10-CM | POA: Insufficient documentation

## 2022-04-20 DIAGNOSIS — Z79899 Other long term (current) drug therapy: Secondary | ICD-10-CM | POA: Insufficient documentation

## 2022-04-20 DIAGNOSIS — R600 Localized edema: Secondary | ICD-10-CM | POA: Insufficient documentation

## 2022-04-20 DIAGNOSIS — Z8616 Personal history of COVID-19: Secondary | ICD-10-CM | POA: Insufficient documentation

## 2022-04-20 DIAGNOSIS — Z7984 Long term (current) use of oral hypoglycemic drugs: Secondary | ICD-10-CM | POA: Insufficient documentation

## 2022-04-20 DIAGNOSIS — I5022 Chronic systolic (congestive) heart failure: Secondary | ICD-10-CM | POA: Insufficient documentation

## 2022-04-20 DIAGNOSIS — Z951 Presence of aortocoronary bypass graft: Secondary | ICD-10-CM | POA: Insufficient documentation

## 2022-04-20 DIAGNOSIS — I2510 Atherosclerotic heart disease of native coronary artery without angina pectoris: Secondary | ICD-10-CM | POA: Insufficient documentation

## 2022-04-20 DIAGNOSIS — I6523 Occlusion and stenosis of bilateral carotid arteries: Secondary | ICD-10-CM | POA: Insufficient documentation

## 2022-04-20 DIAGNOSIS — M549 Dorsalgia, unspecified: Secondary | ICD-10-CM | POA: Insufficient documentation

## 2022-04-20 DIAGNOSIS — I959 Hypotension, unspecified: Secondary | ICD-10-CM | POA: Insufficient documentation

## 2022-04-20 DIAGNOSIS — I110 Hypertensive heart disease with heart failure: Secondary | ICD-10-CM | POA: Insufficient documentation

## 2022-04-20 DIAGNOSIS — I255 Ischemic cardiomyopathy: Secondary | ICD-10-CM | POA: Insufficient documentation

## 2022-04-20 LAB — BASIC METABOLIC PANEL
Anion gap: 16 — ABNORMAL HIGH (ref 5–15)
BUN: 36 mg/dL — ABNORMAL HIGH (ref 8–23)
CO2: 23 mmol/L (ref 22–32)
Calcium: 8.8 mg/dL — ABNORMAL LOW (ref 8.9–10.3)
Chloride: 102 mmol/L (ref 98–111)
Creatinine, Ser: 1.48 mg/dL — ABNORMAL HIGH (ref 0.61–1.24)
GFR, Estimated: 52 mL/min — ABNORMAL LOW (ref >60)
Glucose, Bld: 133 mg/dL — ABNORMAL HIGH (ref 70–99)
Potassium: 4 mmol/L (ref 3.5–5.1)
Sodium: 141 mmol/L (ref 135–145)

## 2022-04-20 LAB — LIPID PANEL
Cholesterol: 118 mg/dL (ref 0–200)
HDL: 40 mg/dL — ABNORMAL LOW (ref >40)
LDL Cholesterol: 53 mg/dL (ref 0–99)
Total CHOL/HDL Ratio: 3 RATIO
Triglycerides: 123 mg/dL (ref <150)
VLDL: 25 mg/dL (ref 0–40)

## 2022-04-20 LAB — BRAIN NATRIURETIC PEPTIDE: B Natriuretic Peptide: 193.3 pg/mL — ABNORMAL HIGH (ref 0.0–100.0)

## 2022-04-20 MED ORDER — CARVEDILOL 3.125 MG PO TABS
3.1250 mg | ORAL_TABLET | Freq: Two times a day (BID) | ORAL | 8 refills | Status: DC
  Filled 2022-04-20: qty 60, 30d supply, fill #0
  Filled 2022-05-26: qty 60, 30d supply, fill #1
  Filled 2022-06-24: qty 60, 30d supply, fill #2
  Filled 2022-06-24: qty 60, 30d supply, fill #0

## 2022-04-20 MED ORDER — POTASSIUM CHLORIDE CRYS ER 20 MEQ PO TBCR
20.0000 meq | EXTENDED_RELEASE_TABLET | ORAL | 0 refills | Status: AC
  Filled 2022-04-20: qty 30, 30d supply, fill #0

## 2022-04-20 NOTE — Patient Instructions (Signed)
Thank you for coming in today  Labs were done today, if any labs are abnormal the clinic will call you No news is good news  Medications: Take 20 meq 1 tablet of Potassium when taking a extra dose of lasix    Follow up appointments:  Your physician recommends that you schedule a follow-up appointment in:  4 months With Dr. Aundra Dubin with echocardiogram  Your physician has requested that you have an echocardiogram. Echocardiography is a painless test that uses sound waves to create images of your heart. It provides your doctor with information about the size and shape of your heart and how well your heart's chambers and valves are working. This procedure takes approximately one hour. There are no restrictions for this procedure.       Do the following things EVERYDAY: Weigh yourself in the morning before breakfast. Write it down and keep it in a log. Take your medicines as prescribed Eat low salt foods--Limit salt (sodium) to 2000 mg per day.  Stay as active as you can everyday Limit all fluids for the day to less than 2 liters   At the [facility], you and your health needs are our priority. As part of our continuing mission to provide you with exceptional heart care, we have created designated Provider Care Teams. These Care Teams include your primary Cardiologist (physician) and Advanced Practice Providers (APPs- Physician Assistants and Nurse Practitioners) who all work together to provide you with the care you need, when you need it.   You may see any of the following providers on your designated Care Team at your next follow up: Dr Glori Bickers Dr [name] Dr. Roxana Hires, NP Lyda Jester, [place] [facility] [place], [place] Forestine Na, NP Audry Riles, PharmD   Please be sure to bring in all your medications bottles to every appointment.    Thank you for choosing [facility]  If  you have any questions or concerns before your next appointment please send [place] a message through [place] or call our office at [phone].    TO LEAVE A MESSAGE FOR THE NURSE SELECT OPTION 2, PLEASE LEAVE A MESSAGE INCLUDING: YOUR NAME DATE OF BIRTH CALL BACK NUMBER REASON FOR CALL**this is important as we prioritize the call backs  YOU WILL RECEIVE A CALL BACK THE SAME DAY AS LONG AS YOU CALL BEFORE 4:00 PM

## 2022-04-20 NOTE — Progress Notes (Signed)
ReDS Vest / Clip - 04/20/22 1129       ReDS Vest / Clip   Station Marker D    Ruler Value 43    ReDS Value Range Low volume    ReDS Actual Value 33    Anatomical Comments sitting

## 2022-04-21 ENCOUNTER — Telehealth (HOSPITAL_COMMUNITY): Payer: Self-pay | Admitting: Cardiology

## 2022-04-21 DIAGNOSIS — I5022 Chronic systolic (congestive) heart failure: Secondary | ICD-10-CM

## 2022-04-21 NOTE — Telephone Encounter (Signed)
Pt aware.

## 2022-04-21 NOTE — Telephone Encounter (Signed)
-----   Message from Rafael Bihari, [place] sent at 04/20/2022  4:34 PM EDT ----- Labs stable, good lipids.  Kidney function elevated a bit from baseline. Repeat BMET in 2 weeks to follow

## 2022-04-25 ENCOUNTER — Other Ambulatory Visit: Payer: Self-pay

## 2022-04-25 ENCOUNTER — Other Ambulatory Visit (HOSPITAL_COMMUNITY): Payer: Self-pay

## 2022-04-26 ENCOUNTER — Other Ambulatory Visit (HOSPITAL_COMMUNITY): Payer: Self-pay

## 2022-04-26 ENCOUNTER — Other Ambulatory Visit: Payer: Self-pay

## 2022-05-05 ENCOUNTER — Ambulatory Visit (HOSPITAL_COMMUNITY)
Admission: RE | Admit: 2022-05-05 | Discharge: 2022-05-05 | Disposition: A | Discharge status: Home / Self Care | Payer: Self-pay | Source: Ambulatory Visit | Attending: Cardiology | Admitting: Cardiology

## 2022-05-05 DIAGNOSIS — I5022 Chronic systolic (congestive) heart failure: Secondary | ICD-10-CM | POA: Insufficient documentation

## 2022-05-05 LAB — BASIC METABOLIC PANEL
Anion gap: 7 (ref 5–15)
BUN: 29 mg/dL — ABNORMAL HIGH (ref 8–23)
CO2: 23 mmol/L (ref 22–32)
Calcium: 8.2 mg/dL — ABNORMAL LOW (ref 8.9–10.3)
Chloride: 108 mmol/L (ref 98–111)
Creatinine, Ser: 1.27 mg/dL — ABNORMAL HIGH (ref 0.61–1.24)
GFR, Estimated: >60 mL/min (ref >60)
Glucose, Bld: 107 mg/dL — ABNORMAL HIGH (ref 70–99)
Potassium: 4 mmol/L (ref 3.5–5.1)
Sodium: 138 mmol/L (ref 135–145)

## 2022-05-12 ENCOUNTER — Other Ambulatory Visit (HOSPITAL_COMMUNITY): Payer: Self-pay

## 2022-05-12 MED FILL — Atorvastatin Calcium Tab 40 MG (Base Equivalent): ORAL | 30 days supply | Qty: 30 | Fill #9 | Status: AC

## 2022-05-26 ENCOUNTER — Other Ambulatory Visit (HOSPITAL_COMMUNITY): Payer: Self-pay

## 2022-05-26 ENCOUNTER — Other Ambulatory Visit: Payer: Self-pay

## 2022-05-27 ENCOUNTER — Other Ambulatory Visit (HOSPITAL_COMMUNITY): Payer: Self-pay

## 2022-05-27 ENCOUNTER — Other Ambulatory Visit: Payer: Self-pay

## 2022-06-01 ENCOUNTER — Ambulatory Visit (INDEPENDENT_AMBULATORY_CARE_PROVIDER_SITE_OTHER): Payer: Self-pay | Admitting: Vascular Surgery

## 2022-06-01 ENCOUNTER — Ambulatory Visit (HOSPITAL_COMMUNITY)
Admission: RE | Admit: 2022-06-01 | Discharge: 2022-06-01 | Disposition: A | Discharge status: Home / Self Care | Payer: Self-pay | Source: Ambulatory Visit | Attending: Vascular Surgery | Admitting: Vascular Surgery

## 2022-06-01 ENCOUNTER — Encounter: Payer: Self-pay | Admitting: Vascular Surgery

## 2022-06-01 VITALS — BP 111/71 | HR 52 | Temp 98.1°F | Resp 20 | Ht 75.0 in | Wt 298.0 lb

## 2022-06-01 DIAGNOSIS — I872 Venous insufficiency (chronic) (peripheral): Secondary | ICD-10-CM

## 2022-06-01 NOTE — Progress Notes (Signed)
Patient ID: Frank Love, male   DOB: 31-Jul-1955, 67 y.o.   MRN: [id number]  Reason for Consult: Follow-up   Referred by Claiborne Rigg, NP  Subjective:     HPI:  Frank Love is a 67 y.o. male originally evaluated 3 months ago for left lower extremity swelling with skin changes consistent with C4 a venous disease.  Since first evaluation he has been wearing thigh-high stockings that were fitted in our office.  States that he does have improved swelling particularly when he is wearing the compression stockings when they are off he notices worsening swelling also has notable swelling on the right but left is always been worse.  Does have underlying history of heart failure and is on Lasix for this.  He has not had any tissue loss or ulceration of the left lower extremity but does have thickened skin and spider veins.  Past Medical History:  Diagnosis Date   Cancer (HCC)    penile cancer   CHF (congestive heart failure) (HCC)    Chronic pain of right knee    Coronary artery disease    Hypertension    Phimosis    Family History  Problem Relation Age of Onset   Diabetes Mother    Heart disease Mother    Past Surgical History:  Procedure Laterality Date   CIRCUMCISION N/A 10/12/2018   Procedure: CIRCUMCISION ADULT;  Surgeon: Marcine Matar, MD;  Location: [facility];  Service: Urology;  Laterality: N/A;  45 mins   CO2 LASER APPLICATION N/A 04/29/2019   Procedure: CO2 LASER APPLICATION;  Surgeon: Marcine Matar, MD;  Location: [facility];  Service: Urology;  Laterality: N/A;  30 MINS   COLONOSCOPY     CORONARY ARTERY BYPASS GRAFT N/A 12/12/2018   Procedure: CORONARY ARTERY BYPASS GRAFTING (CABG) x four, using bilateral internal mammary arteries and right leg greater saphenous vein harvested endoscopically;  Surgeon: Linden Dolin, MD;  [facility];  Service: Open Heart Surgery;  Laterality: N/A;   LEFT HEART CATH AND CORONARY ANGIOGRAPHY N/A 11/28/2018   Procedure:  LEFT HEART CATH AND CORONARY ANGIOGRAPHY;  Surgeon: Kathleene Hazel, MD;  [facility];  Service: Cardiovascular;  Laterality: N/A;   MEDIASTINAL EXPLORATION N/A 12/12/2018   Procedure: Mediastinal Re-Exploration after bypass graft;  Surgeon: Linden Dolin, MD;  [facility];  Service: Open Heart Surgery;  Laterality: N/A;   TEE WITHOUT CARDIOVERSION N/A 12/12/2018   Procedure: TRANSESOPHAGEAL ECHOCARDIOGRAM (TEE);  Surgeon: Linden Dolin, MD;  Location: [facility];  Service: Open Heart Surgery;  Laterality: N/A;   TOOTH EXTRACTION     TOTAL HIP ARTHROPLASTY Right 03/08/2019   Procedure: RIGHT TOTAL HIP ARTHROPLASTY ANTERIOR APPROACH;  Surgeon: Kathryne Hitch, MD;  Location: [facility];  Service: Orthopedics;  Laterality: Right;    Short Social History:  Social History   Tobacco Use   Smoking status: Never   Smokeless tobacco: Never  Substance Use Topics   Alcohol use: No    Alcohol/week: 0.0 standard drinks of alcohol    No Known Allergies  Current Outpatient Medications  Medication Sig Dispense Refill   aspirin EC 81 MG tablet Take 1 tablet (81 mg total) by mouth daily. Swallow whole. 90 tablet 3   atorvastatin (LIPITOR) 40 MG tablet Take 1 tablet (40 mg total) by mouth daily at 6 PM. 90 tablet 30   carvedilol (COREG) 3.125 MG tablet Take 1 tablet (3.125 mg total) by mouth 2 (two) times  daily with a meal. 60 tablet 8   carvedilol (COREG) 6.25 MG tablet Take 1.5 tablets (9.375 mg total) by mouth 2 (two) times daily. (Patient taking differently: Take 3.125 mg by mouth 2 (two) times daily.) 180 tablet 3   dapagliflozin propanediol (FARXIGA) 10 MG TABS tablet TAKE 1 TABLET (10 MG TOTAL) BY MOUTH DAILY BEFORE BREAKFAST. 30 tablet 5   furosemide (LASIX) 40 MG tablet Take 1 tablet (40 mg total) by mouth daily. 30 tablet 3   nitroGLYCERIN (NITROSTAT) 0.4 MG SL tablet Place 1 tablet (0.4 mg total) under the tongue every 5 (five) minutes as needed for chest  pain. NO more than 3 pills--if you have the need to take 2 pills or more call MD 30 tablet 3   potassium chloride SA (KLOR-CON M) 20 MEQ tablet Take 1 tablet (20 mEq total) by mouth as directed. Take 1 tablet only take when taking a extra lasix dose 30 tablet 0   promethazine-dextromethorphan (PROMETHAZINE-DM) 6.25-15 MG/5ML syrup Take 5 mLs by mouth 4 (four) times daily as needed for cough. 240 mL 0   sacubitril-valsartan (ENTRESTO) 97-103 MG Take 1 tablet by mouth 2 (two) times daily. 180 tablet 3   spironolactone (ALDACTONE) 25 MG tablet Take 1 tablet (25 mg total) by mouth daily. 30 tablet 6   No current facility-administered medications for this visit.    Review of Systems  Constitutional:  Constitutional negative. HENT: HENT negative.  Eyes: Eyes negative.  Respiratory: Respiratory negative.  Cardiovascular: Positive for leg swelling.  GI: Gastrointestinal negative.  Musculoskeletal: Musculoskeletal negative. Positive for leg pain.  Skin: Skin negative.  Neurological: Neurological negative. Hematologic: Hematologic/lymphatic negative.  Psychiatric: Psychiatric negative.        Objective:  Objective   Vitals:   06/01/22 1123  BP: 111/71  Pulse: (!) 52  Resp: 20  Temp: 98.1 F (36.7 C)  SpO2: 93%  Weight: 298 lb (135.2 kg)  Height: 6\' 3"  (1.905 m)   Body mass index is 37.25 kg/m.  Physical Exam HENT:     Head: Normocephalic.     Nose: Nose normal.  Eyes:     Pupils: Pupils are equal, round, and reactive to light.  Cardiovascular:     Rate and Rhythm: Normal rate.  Pulmonary:     Effort: Pulmonary effort is normal.  Abdominal:     General: Abdomen is flat.  Musculoskeletal:     Right lower leg: No edema.     Left lower leg: Edema present.  Skin:    General: Skin is warm.     Capillary Refill: Capillary refill takes less than 2 seconds.  Neurological:     General: No focal deficit present.     Mental Status: He is alert.  Psychiatric:        Mood and  Affect: Mood normal.        Behavior: Behavior normal.        Thought Content: Thought content normal.        Judgment: Judgment normal.     Data: Left +--------------+---------+------+-----------+------------+-----------------  ----+  FV prox                 yes   >1 second                                     +--------------+---------+------+-----------+------------+-----------------  ----+  FV mid  yes   >1 second                                     +--------------+---------+------+-----------+------------+-----------------  ----+  FV dist                 yes   >1 second             duplicated with                                                             incompetent  accessory                                                     vein                    +--------------+---------+------+-----------+------------+-----------------  ----+  Popliteal              yes   >1 second                                     +--------------+---------+------+-----------+------------+-----------------  ----+  GSV at [facility]              yes    >500 ms      0.78                            +--------------+---------+------+-----------+------------+-----------------  ----+  GSV prox thigh          yes    >500 ms      0.72                            +--------------+---------+------+-----------+------------+-----------------  ----+  GSV mid thigh             yes    >500 ms      0.54                            +--------------+---------+------+-----------+------------+-----------------  ----+  GSV dist thighno                            0.47                            +--------------+---------+------+-----------+------------+-----------------  ----+  GSV at knee   no                            0.48                            +--------------+---------+------+-----------+------------+-----------------   ----+  GSV prox calf           yes    >500 ms  0.41    branching                                                                   varicosities            +--------------+---------+------+-----------+------------+-----------------  ----+  SSV Pop Fossa no                            0.29                            +--------------+---------+------+-----------+------------+-----------------  ----+  SSV prox calf no                            0.31                            +--------------+---------+------+-----------+------------+-----------------  ----+        Summary:  Left:  - No evidence of deep vein thrombosis seen in the left lower extremity,  from the common femoral through the popliteal veins.  - No evidence of superficial venous thrombosis in the left lower  extremity.    - No evidence of superficial venous reflux seen in the left short  saphenous vein.    - Venous reflux is noted in the left common femoral vein.  - Venous reflux is noted in the left profunda femoral vein.  - Venous reflux is noted in the left sapheno-femoral junction.  - Venous reflux is noted in the left greater saphenous vein in the thigh.  - Venous reflux is noted in the left greater saphenous vein in the calf.  - Venous reflux is noted in the left femoral vein.  - Venous reflux is noted in the left popliteal vein.      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  I evaluated the great saphenous vein at bedside today and from a few centimeters above the knee it enters the fascia and is large and refluxing all the way throughout the thigh to the saphenofemoral junction.  I discussed with the patient the need for weight loss in conjunction with great saphenous vein ablation to control his significant edema with skin changes on the left and he demonstrates good understanding.  He  will need to continue compression stockings even after the procedure.  Given minimal symptoms on the right likely do not need to investigate for any reflux on that side.     Maeola Harman MD [facility]

## 2022-06-08 ENCOUNTER — Other Ambulatory Visit (HOSPITAL_COMMUNITY): Payer: Self-pay

## 2022-06-08 MED FILL — Atorvastatin Calcium Tab 40 MG (Base Equivalent): ORAL | 30 days supply | Qty: 30 | Fill #10 | Status: AC

## 2022-06-10 ENCOUNTER — Other Ambulatory Visit (HOSPITAL_COMMUNITY): Payer: Self-pay

## 2022-06-15 ENCOUNTER — Encounter: Payer: Self-pay | Admitting: Nurse Practitioner

## 2022-06-15 ENCOUNTER — Ambulatory Visit: Payer: Self-pay | Attending: Nurse Practitioner | Admitting: Nurse Practitioner

## 2022-06-15 VITALS — BP 103/54 | HR 56 | Ht 75.0 in | Wt 298.6 lb

## 2022-06-15 DIAGNOSIS — R7989 Other specified abnormal findings of blood chemistry: Secondary | ICD-10-CM

## 2022-06-15 DIAGNOSIS — I10 Essential (primary) hypertension: Secondary | ICD-10-CM

## 2022-06-15 NOTE — Patient Instructions (Signed)
Citrical + D at least [phone] calcium and 1000 vitamin D

## 2022-06-15 NOTE — Progress Notes (Signed)
Assessment & Plan:  Frank Love was seen today for hypertension.  Diagnoses and all orders for this visit:  Primary hypertension Continue all antihypertensives as prescribed.  Reminded to bring in blood pressure log for follow  up appointment.  RECOMMENDATIONS: DASH/Mediterranean Diets are healthier choices for HTN.    Low serum calcium Calcium is low and likely related to spironolactone.  Recommend Citracal with D at this time -     CMP14+EGFR -     VITAMIN D 25 Hydroxy (Vit-D Deficiency, Fractures)    Patient has been counseled on age-appropriate routine health concerns for screening and prevention. These are reviewed and up-to-date. Referrals have been placed accordingly. Immunizations are up-to-date or declined.    Subjective:   Chief Complaint  Patient presents with   Hypertension   HPI Frank Love 67 y.o. male presents to office today for follow-up to hypertension  He also has a PMH of BLE edema, CHF, CAD (he has been followed by the heart failure clinic and cardiology    Currently being followed by vein and vascular for chronic venous insufficiency of the left lower extremity   Blood pressure is controlled and low normal today.  He denies any systolic blood pressure readings lower than 100 at home. He is currently taking carvedilol 6.125 mg twice daily, furosemide 40 mg daily, Entresto 97-103 mg twice daily, spironolactone 25 mg daily.  BP Readings from Last 3 Encounters:  06/15/22 (!) 103/54  06/01/22 111/71  04/20/22 110/80     Review of Systems  Constitutional:  Negative for fever, malaise/fatigue and weight loss.  HENT: Negative.  Negative for nosebleeds.   Eyes: Negative.  Negative for blurred vision, double vision and photophobia.  Respiratory: Negative.  Negative for cough and shortness of breath.   Cardiovascular:  Positive for leg swelling. Negative for chest pain and palpitations.  Gastrointestinal: Negative.  Negative for heartburn, nausea and  vomiting.  Musculoskeletal: Negative.  Negative for myalgias.  Neurological: Negative.  Negative for dizziness, focal weakness, seizures and headaches.  Psychiatric/Behavioral: Negative.  Negative for suicidal ideas.     Past Medical History:  Diagnosis Date   Cancer (HCC)    penile cancer   CHF (congestive heart failure) (HCC)    Chronic pain of right knee    Coronary artery disease    Hypertension    Phimosis     Past Surgical History:  Procedure Laterality Date   CIRCUMCISION N/A 10/12/2018   Procedure: CIRCUMCISION ADULT;  Surgeon: Marcine Matar, MD;  Location: [facility];  Service: Urology;  Laterality: N/A;  45 mins   CO2 LASER APPLICATION N/A 04/29/2019   Procedure: CO2 LASER APPLICATION;  Surgeon: Marcine Matar, MD;  Location: [facility];  Service: Urology;  Laterality: N/A;  30 MINS   COLONOSCOPY     CORONARY ARTERY BYPASS GRAFT N/A 12/12/2018   Procedure: CORONARY ARTERY BYPASS GRAFTING (CABG) x four, using bilateral internal mammary arteries and right leg greater saphenous vein harvested endoscopically;  Surgeon: Linden Dolin, MD;  [facility];  Service: Open Heart Surgery;  Laterality: N/A;   LEFT HEART CATH AND CORONARY ANGIOGRAPHY N/A 11/28/2018   Procedure: LEFT HEART CATH AND CORONARY ANGIOGRAPHY;  Surgeon: Kathleene Hazel, MD;  [facility];  Service: Cardiovascular;  Laterality: N/A;   MEDIASTINAL EXPLORATION N/A 12/12/2018   Procedure: Mediastinal Re-Exploration after bypass graft;  Surgeon: Linden Dolin, MD;  Location: [facility];  Service: Open Heart Surgery;  Laterality: N/A;   TEE WITHOUT  CARDIOVERSION N/A 12/12/2018   Procedure: TRANSESOPHAGEAL ECHOCARDIOGRAM (TEE);  Surgeon: Linden Dolin, MD;  Location: [facility];  Service: Open Heart Surgery;  Laterality: N/A;   TOOTH EXTRACTION     TOTAL HIP ARTHROPLASTY Right 03/08/2019   Procedure: RIGHT TOTAL HIP ARTHROPLASTY ANTERIOR APPROACH;  Surgeon: Kathryne Hitch, MD;   Location: [facility];  Service: Orthopedics;  Laterality: Right;    Family History  Problem Relation Age of Onset   Diabetes Mother    Heart disease Mother     Social History Reviewed with no changes to be made today.   Outpatient Medications Prior to Visit  Medication Sig Dispense Refill   aspirin EC 81 MG tablet Take 1 tablet (81 mg total) by mouth daily. Swallow whole. 90 tablet 3   atorvastatin (LIPITOR) 40 MG tablet Take 1 tablet (40 mg total) by mouth daily at 6 PM. 90 tablet 30   carvedilol (COREG) 3.125 MG tablet Take 1 tablet (3.125 mg total) by mouth 2 (two) times daily with a meal. 60 tablet 8   dapagliflozin propanediol (FARXIGA) 10 MG TABS tablet TAKE 1 TABLET (10 MG TOTAL) BY MOUTH DAILY BEFORE BREAKFAST. 30 tablet 5   furosemide (LASIX) 40 MG tablet Take 1 tablet (40 mg total) by mouth daily. 30 tablet 3   nitroGLYCERIN (NITROSTAT) 0.4 MG SL tablet Place 1 tablet (0.4 mg total) under the tongue every 5 (five) minutes as needed for chest pain. NO more than 3 pills--if you have the need to take 2 pills or more call MD 30 tablet 3   potassium chloride SA (KLOR-CON M) 20 MEQ tablet Take 1 tablet (20 mEq total) by mouth as directed. Take 1 tablet only take when taking a extra lasix dose 30 tablet 0   promethazine-dextromethorphan (PROMETHAZINE-DM) 6.25-15 MG/5ML syrup Take 5 mLs by mouth 4 (four) times daily as needed for cough. 240 mL 0   sacubitril-valsartan (ENTRESTO) 97-103 MG Take 1 tablet by mouth 2 (two) times daily. 180 tablet 3   spironolactone (ALDACTONE) 25 MG tablet Take 1 tablet (25 mg total) by mouth daily. 30 tablet 6   carvedilol (COREG) 6.25 MG tablet Take 1.5 tablets (9.375 mg total) by mouth 2 (two) times daily. (Patient taking differently: Take 3.125 mg by mouth 2 (two) times daily.) 180 tablet 3   No facility-administered medications prior to visit.    No Known Allergies     Objective:    BP (!) 103/54 (BP Location: Left Arm, Patient Position: Sitting, Cuff  Size: Normal)   Pulse (!) 56   Ht 6\' 3"  (1.905 m)   Wt 298 lb 9.6 oz (135.4 kg)   SpO2 100%   BMI 37.32 kg/m  Wt Readings from Last 3 Encounters:  06/15/22 298 lb 9.6 oz (135.4 kg)  06/01/22 298 lb (135.2 kg)  04/20/22 293 lb (132.9 kg)    Physical Exam Vitals and nursing note reviewed.  Constitutional:      Appearance: He is well-developed.  HENT:     Head: Normocephalic and atraumatic.  Cardiovascular:     Rate and Rhythm: Regular rhythm. Bradycardia present.     Heart sounds: Normal heart sounds. No murmur heard.    No friction rub. No gallop.  Pulmonary:     Effort: Pulmonary effort is normal. No tachypnea or respiratory distress.     Breath sounds: Normal breath sounds. No decreased breath sounds, wheezing, rhonchi or rales.  Chest:     Chest wall: No tenderness.  Abdominal:  General: Bowel sounds are normal.     Palpations: Abdomen is soft.  Musculoskeletal:        General: Normal range of motion.     Cervical back: Normal range of motion.  Skin:    General: Skin is warm and dry.  Neurological:     Mental Status: He is alert and oriented to person, place, and time.     Coordination: Coordination normal.  Psychiatric:        Behavior: Behavior normal. Behavior is cooperative.        Thought Content: Thought content normal.        Judgment: Judgment normal.          Patient has been counseled extensively about nutrition and exercise as well as the importance of adherence with medications and regular follow-up. The patient was given clear instructions to go to ER or return to medical center if symptoms don't improve, worsen or new problems develop. The patient verbalized understanding.   Follow-up: Return in about 3 months (around 09/15/2022) for physical in august or september.   Claiborne Rigg, FNP-BC [facility] [place], [place] [phone]   06/15/2022, 12:09 PM

## 2022-06-16 LAB — CMP14+EGFR
ALT: 16 IU/L (ref 0–44)
AST: 18 IU/L (ref 0–40)
Albumin/Globulin Ratio: 1.6 (ref 1.2–2.2)
Albumin: 4.2 g/dL (ref 3.9–4.9)
Alkaline Phosphatase: 88 IU/L (ref 44–121)
BUN/Creatinine Ratio: 20 (ref 10–24)
BUN: 31 mg/dL — ABNORMAL HIGH (ref 8–27)
Bilirubin Total: 0.7 mg/dL (ref 0.0–1.2)
CO2: 21 mmol/L (ref 20–29)
Calcium: 8.7 mg/dL (ref 8.6–10.2)
Chloride: 105 mmol/L (ref 96–106)
Creatinine, Ser: 1.53 mg/dL — ABNORMAL HIGH (ref 0.76–1.27)
Globulin, Total: 2.7 g/dL (ref 1.5–4.5)
Glucose: 113 mg/dL — ABNORMAL HIGH (ref 70–99)
Potassium: 4.1 mmol/L (ref 3.5–5.2)
Sodium: 143 mmol/L (ref 134–144)
Total Protein: 6.9 g/dL (ref 6.0–8.5)
eGFR: 50 mL/min/1.73 — ABNORMAL LOW

## 2022-06-16 LAB — VITAMIN D 25 HYDROXY (VIT D DEFICIENCY, FRACTURES): Vit D, 25-Hydroxy: 26.8 ng/mL — ABNORMAL LOW (ref 30.0–100.0)

## 2022-06-19 ENCOUNTER — Other Ambulatory Visit: Payer: Self-pay | Admitting: Nurse Practitioner

## 2022-06-19 DIAGNOSIS — R7989 Other specified abnormal findings of blood chemistry: Secondary | ICD-10-CM

## 2022-06-19 DIAGNOSIS — E559 Vitamin D deficiency, unspecified: Secondary | ICD-10-CM

## 2022-06-19 MED ORDER — VITAMIN D (ERGOCALCIFEROL) 1.25 MG (50000 UNIT) PO CAPS
50000.0000 [IU] | ORAL_CAPSULE | ORAL | 1 refills | Status: DC
  Filled 2022-06-19 – 2022-06-22 (×2): qty 12, 84d supply, fill #0

## 2022-06-21 ENCOUNTER — Other Ambulatory Visit (HOSPITAL_COMMUNITY): Payer: Self-pay

## 2022-06-21 ENCOUNTER — Other Ambulatory Visit: Payer: Self-pay

## 2022-06-21 ENCOUNTER — Telehealth (HOSPITAL_COMMUNITY): Payer: Self-pay | Admitting: Pharmacy Technician

## 2022-06-21 MED ORDER — DAPAGLIFLOZIN PROPANEDIOL 10 MG PO TABS: ORAL_TABLET | Freq: Every day | ORAL | 3 refills | Status: DC

## 2022-06-21 NOTE — Telephone Encounter (Signed)
Advanced Heart Failure Patient Advocate Encounter  Patient left vm requesting a new RX be sent to [facility]. Called and spoke with the patient. Sent 90 day RX request to [place] ([facility]) to send to [facility].  Archer Asa, CPhT

## 2022-06-22 ENCOUNTER — Other Ambulatory Visit (HOSPITAL_COMMUNITY): Payer: Self-pay

## 2022-06-22 ENCOUNTER — Other Ambulatory Visit: Payer: Self-pay

## 2022-06-24 ENCOUNTER — Encounter: Payer: Self-pay | Admitting: Pharmacist

## 2022-06-24 ENCOUNTER — Other Ambulatory Visit: Payer: Self-pay

## 2022-06-24 ENCOUNTER — Other Ambulatory Visit (HOSPITAL_COMMUNITY): Payer: Self-pay

## 2022-06-28 ENCOUNTER — Other Ambulatory Visit: Payer: Self-pay

## 2022-06-29 ENCOUNTER — Telehealth (HOSPITAL_COMMUNITY): Payer: Self-pay | Admitting: Pharmacy Technician

## 2022-06-29 NOTE — Telephone Encounter (Signed)
Advanced Heart Failure Patient Advocate Encounter  Patient called stating he has yet to receive his Farxiga from [facility]. He did confirm they received the RX but requested samples. Will provide.   Advised patient to call back if he doesn't receive it.   Frank Love, CPhT

## 2022-07-10 ENCOUNTER — Other Ambulatory Visit (HOSPITAL_COMMUNITY): Payer: Self-pay

## 2022-07-10 MED FILL — Atorvastatin Calcium Tab 40 MG (Base Equivalent): ORAL | 30 days supply | Qty: 30 | Fill #11 | Status: AC

## 2022-07-25 ENCOUNTER — Other Ambulatory Visit: Payer: Self-pay

## 2022-07-25 ENCOUNTER — Emergency Department (HOSPITAL_COMMUNITY): Payer: Medicare Other

## 2022-07-25 ENCOUNTER — Telehealth: Payer: Self-pay

## 2022-07-25 ENCOUNTER — Ambulatory Visit: Payer: Self-pay

## 2022-07-25 ENCOUNTER — Inpatient Hospital Stay (HOSPITAL_COMMUNITY): Payer: Medicare Other

## 2022-07-25 ENCOUNTER — Inpatient Hospital Stay (HOSPITAL_COMMUNITY)
Admission: EM | Admit: 2022-07-25 | Discharge: 2022-07-30 | DRG: 871 | Disposition: A | Discharge status: Home / Self Care | Payer: Medicare Other | Attending: Internal Medicine | Admitting: Internal Medicine

## 2022-07-25 ENCOUNTER — Encounter (HOSPITAL_COMMUNITY): Payer: Self-pay

## 2022-07-25 ENCOUNTER — Ambulatory Visit: Payer: Self-pay | Attending: Family Medicine | Admitting: Family Medicine

## 2022-07-25 ENCOUNTER — Encounter: Payer: Self-pay | Admitting: Family Medicine

## 2022-07-25 VITALS — BP 70/40 | HR 60 | Ht 75.0 in | Wt 290.6 lb

## 2022-07-25 DIAGNOSIS — M7989 Other specified soft tissue disorders: Secondary | ICD-10-CM

## 2022-07-25 DIAGNOSIS — I5082 Biventricular heart failure: Secondary | ICD-10-CM | POA: Diagnosis present

## 2022-07-25 DIAGNOSIS — E785 Hyperlipidemia, unspecified: Secondary | ICD-10-CM | POA: Diagnosis present

## 2022-07-25 DIAGNOSIS — E871 Hypo-osmolality and hyponatremia: Secondary | ICD-10-CM | POA: Diagnosis present

## 2022-07-25 DIAGNOSIS — Z5986 Financial insecurity: Secondary | ICD-10-CM

## 2022-07-25 DIAGNOSIS — I110 Hypertensive heart disease with heart failure: Secondary | ICD-10-CM | POA: Diagnosis present

## 2022-07-25 DIAGNOSIS — I9589 Other hypotension: Secondary | ICD-10-CM | POA: Diagnosis present

## 2022-07-25 DIAGNOSIS — I255 Ischemic cardiomyopathy: Secondary | ICD-10-CM | POA: Diagnosis present

## 2022-07-25 DIAGNOSIS — Z951 Presence of aortocoronary bypass graft: Secondary | ICD-10-CM

## 2022-07-25 DIAGNOSIS — Z833 Family history of diabetes mellitus: Secondary | ICD-10-CM

## 2022-07-25 DIAGNOSIS — Z8616 Personal history of COVID-19: Secondary | ICD-10-CM | POA: Diagnosis not present

## 2022-07-25 DIAGNOSIS — N170 Acute kidney failure with tubular necrosis: Secondary | ICD-10-CM | POA: Diagnosis present

## 2022-07-25 DIAGNOSIS — E669 Obesity, unspecified: Secondary | ICD-10-CM | POA: Diagnosis present

## 2022-07-25 DIAGNOSIS — G8929 Other chronic pain: Secondary | ICD-10-CM | POA: Diagnosis present

## 2022-07-25 DIAGNOSIS — Z8249 Family history of ischemic heart disease and other diseases of the circulatory system: Secondary | ICD-10-CM

## 2022-07-25 DIAGNOSIS — D696 Thrombocytopenia, unspecified: Secondary | ICD-10-CM | POA: Diagnosis present

## 2022-07-25 DIAGNOSIS — L03116 Cellulitis of left lower limb: Secondary | ICD-10-CM | POA: Diagnosis not present

## 2022-07-25 DIAGNOSIS — I5043 Acute on chronic combined systolic (congestive) and diastolic (congestive) heart failure: Secondary | ICD-10-CM | POA: Diagnosis not present

## 2022-07-25 DIAGNOSIS — E860 Dehydration: Secondary | ICD-10-CM | POA: Diagnosis present

## 2022-07-25 DIAGNOSIS — I2510 Atherosclerotic heart disease of native coronary artery without angina pectoris: Secondary | ICD-10-CM | POA: Diagnosis present

## 2022-07-25 DIAGNOSIS — Z7982 Long term (current) use of aspirin: Secondary | ICD-10-CM

## 2022-07-25 DIAGNOSIS — E872 Acidosis, unspecified: Secondary | ICD-10-CM | POA: Diagnosis present

## 2022-07-25 DIAGNOSIS — Z79899 Other long term (current) drug therapy: Secondary | ICD-10-CM | POA: Diagnosis not present

## 2022-07-25 DIAGNOSIS — A419 Sepsis, unspecified organism: Principal | ICD-10-CM | POA: Diagnosis present

## 2022-07-25 DIAGNOSIS — I5042 Chronic combined systolic (congestive) and diastolic (congestive) heart failure: Secondary | ICD-10-CM | POA: Diagnosis present

## 2022-07-25 DIAGNOSIS — E861 Hypovolemia: Secondary | ICD-10-CM | POA: Diagnosis present

## 2022-07-25 DIAGNOSIS — Z8673 Personal history of transient ischemic attack (TIA), and cerebral infarction without residual deficits: Secondary | ICD-10-CM

## 2022-07-25 DIAGNOSIS — N179 Acute kidney failure, unspecified: Secondary | ICD-10-CM

## 2022-07-25 DIAGNOSIS — I959 Hypotension, unspecified: Secondary | ICD-10-CM

## 2022-07-25 DIAGNOSIS — G929 Unspecified toxic encephalopathy: Secondary | ICD-10-CM | POA: Diagnosis present

## 2022-07-25 DIAGNOSIS — Z96641 Presence of right artificial hip joint: Secondary | ICD-10-CM | POA: Diagnosis present

## 2022-07-25 DIAGNOSIS — R6521 Severe sepsis with septic shock: Secondary | ICD-10-CM | POA: Diagnosis present

## 2022-07-25 DIAGNOSIS — Z6836 Body mass index (BMI) 36.0-36.9, adult: Secondary | ICD-10-CM

## 2022-07-25 DIAGNOSIS — K529 Noninfective gastroenteritis and colitis, unspecified: Secondary | ICD-10-CM

## 2022-07-25 DIAGNOSIS — Z8701 Personal history of pneumonia (recurrent): Secondary | ICD-10-CM

## 2022-07-25 DIAGNOSIS — Z8549 Personal history of malignant neoplasm of other male genital organs: Secondary | ICD-10-CM

## 2022-07-25 LAB — COMPREHENSIVE METABOLIC PANEL
ALT: 42 U/L (ref 0–44)
AST: 44 U/L — ABNORMAL HIGH (ref 15–41)
Albumin: 2.9 g/dL — ABNORMAL LOW (ref 3.5–5.0)
Alkaline Phosphatase: 76 U/L (ref 38–126)
Anion gap: 16 — ABNORMAL HIGH (ref 5–15)
BUN: 102 mg/dL — ABNORMAL HIGH (ref 8–23)
CO2: 16 mmol/L — ABNORMAL LOW (ref 22–32)
Calcium: 8 mg/dL — ABNORMAL LOW (ref 8.9–10.3)
Chloride: 100 mmol/L (ref 98–111)
Creatinine, Ser: 3.27 mg/dL — ABNORMAL HIGH (ref 0.61–1.24)
GFR, Estimated: 20 mL/min — ABNORMAL LOW (ref >60)
Glucose, Bld: 139 mg/dL — ABNORMAL HIGH (ref 70–99)
Potassium: 3.9 mmol/L (ref 3.5–5.1)
Sodium: 132 mmol/L — ABNORMAL LOW (ref 135–145)
Total Bilirubin: 1.4 mg/dL — ABNORMAL HIGH (ref 0.3–1.2)
Total Protein: 7.1 g/dL (ref 6.5–8.1)

## 2022-07-25 LAB — CBC WITH DIFFERENTIAL/PLATELET
Abs Immature Granulocytes: 0.1 10*3/uL — ABNORMAL HIGH (ref 0.00–0.07)
Basophils Absolute: 0 10*3/uL (ref 0.0–0.1)
Basophils Relative: 0 %
Eosinophils Absolute: 0.2 10*3/uL (ref 0.0–0.5)
Eosinophils Relative: 2 %
HCT: 43.5 % (ref 39.0–52.0)
Hemoglobin: 13.8 g/dL (ref 13.0–17.0)
Immature Granulocytes: 1 %
Lymphocytes Relative: 6 %
Lymphs Abs: 0.5 10*3/uL — ABNORMAL LOW (ref 0.7–4.0)
MCH: 27.9 pg (ref 26.0–34.0)
MCHC: 31.7 g/dL (ref 30.0–36.0)
MCV: 87.9 fL (ref 80.0–100.0)
Monocytes Absolute: 0.6 10*3/uL (ref 0.1–1.0)
Monocytes Relative: 6 %
Neutro Abs: 8.3 10*3/uL — ABNORMAL HIGH (ref 1.7–7.7)
Neutrophils Relative %: 85 %
Platelets: 140 10*3/uL — ABNORMAL LOW (ref 150–400)
RBC: 4.95 MIL/uL (ref 4.22–5.81)
RDW: 14.3 % (ref 11.5–15.5)
WBC: 9.7 10*3/uL (ref 4.0–10.5)
nRBC: 0 % (ref 0.0–0.2)

## 2022-07-25 LAB — LACTIC ACID, PLASMA
Lactic Acid, Venous: 1.9 mmol/L (ref 0.5–1.9)
Lactic Acid, Venous: 1.9 mmol/L (ref 0.5–1.9)

## 2022-07-25 LAB — GLUCOSE, CAPILLARY: Glucose-Capillary: 106 mg/dL — ABNORMAL HIGH (ref 70–99)

## 2022-07-25 MED ORDER — CHLORHEXIDINE GLUCONATE CLOTH 2 % EX PADS
6.0000 | MEDICATED_PAD | Freq: Every day | CUTANEOUS | Status: DC
  Administered 2022-07-25 – 2022-07-30 (×5): 6 via TOPICAL

## 2022-07-25 MED ORDER — VANCOMYCIN HCL 2000 MG/400ML IV SOLN
2000.0000 mg | Freq: Once | INTRAVENOUS | Status: AC
  Administered 2022-07-25: 2000 mg via INTRAVENOUS
  Filled 2022-07-25: qty 400

## 2022-07-25 MED ORDER — SODIUM CHLORIDE 0.9 % IV SOLN
2.0000 g | Freq: Once | INTRAVENOUS | Status: AC
  Administered 2022-07-25: 2 g via INTRAVENOUS
  Filled 2022-07-25: qty 20

## 2022-07-25 MED ORDER — SODIUM CHLORIDE 0.9 % IV SOLN
250.0000 mL | INTRAVENOUS | Status: DC
  Administered 2022-07-25 – 2022-07-27 (×3): 250 mL via INTRAVENOUS

## 2022-07-25 MED ORDER — VANCOMYCIN VARIABLE DOSE PER UNSTABLE RENAL FUNCTION (PHARMACIST DOSING): Status: DC

## 2022-07-25 MED ORDER — SODIUM CHLORIDE 0.9 % IV SOLN
2.0000 g | Freq: Two times a day (BID) | INTRAVENOUS | Status: DC
  Administered 2022-07-25 – 2022-07-26 (×3): 2 g via INTRAVENOUS
  Filled 2022-07-25 (×3): qty 12.5

## 2022-07-25 MED ORDER — ASPIRIN 81 MG PO CHEW
81.0000 mg | CHEWABLE_TABLET | Freq: Every day | ORAL | Status: DC
  Administered 2022-07-25 – 2022-07-30 (×6): 81 mg via ORAL
  Filled 2022-07-25 (×6): qty 1

## 2022-07-25 MED ORDER — LACTATED RINGERS IV BOLUS
1000.0000 mL | Freq: Once | INTRAVENOUS | Status: AC
  Administered 2022-07-25: 1000 mL via INTRAVENOUS

## 2022-07-25 MED ORDER — SODIUM CHLORIDE 0.9 % IV BOLUS: 500.0000 mL | Freq: Once | INTRAVENOUS | Status: DC

## 2022-07-25 MED ORDER — POLYETHYLENE GLYCOL 3350 17 G PO PACK: 17.0000 g | PACK | Freq: Every day | ORAL | Status: DC | PRN

## 2022-07-25 MED ORDER — DOCUSATE SODIUM 100 MG PO CAPS: 100.0000 mg | ORAL_CAPSULE | Freq: Two times a day (BID) | ORAL | Status: DC | PRN

## 2022-07-25 MED ORDER — HEPARIN SODIUM (PORCINE) 5000 UNIT/ML IJ SOLN
5000.0000 [IU] | Freq: Three times a day (TID) | INTRAMUSCULAR | Status: DC
  Administered 2022-07-25 – 2022-07-30 (×13): 5000 [IU] via SUBCUTANEOUS
  Filled 2022-07-25 (×13): qty 1

## 2022-07-25 MED ORDER — ALBUMIN HUMAN 25 % IV SOLN
12.5000 g | Freq: Once | INTRAVENOUS | Status: AC
  Administered 2022-07-25: 12.5 g via INTRAVENOUS
  Filled 2022-07-25: qty 50

## 2022-07-25 MED ORDER — NOREPINEPHRINE 4 MG/250ML-% IV SOLN
2.0000 ug/min | INTRAVENOUS | Status: DC
  Administered 2022-07-25: 2 ug/min via INTRAVENOUS
  Administered 2022-07-26: 7 ug/min via INTRAVENOUS
  Administered 2022-07-26: 4 ug/min via INTRAVENOUS
  Filled 2022-07-25 (×3): qty 250

## 2022-07-25 MED ORDER — LACTATED RINGERS IV SOLN: INTRAVENOUS | Status: AC

## 2022-07-25 MED ORDER — ATORVASTATIN CALCIUM 40 MG PO TABS
40.0000 mg | ORAL_TABLET | Freq: Every day | ORAL | Status: DC
  Administered 2022-07-25: 40 mg via ORAL
  Filled 2022-07-25: qty 1

## 2022-07-25 MED ORDER — SODIUM CHLORIDE 0.9 % IV BOLUS: 1000.0000 mL | Freq: Once | INTRAVENOUS | Status: DC

## 2022-07-25 MED ORDER — SODIUM CHLORIDE 0.9 % IV BOLUS
1000.0000 mL | Freq: Once | INTRAVENOUS | Status: AC
  Administered 2022-07-25: 1000 mL via INTRAVENOUS

## 2022-07-25 NOTE — ED Triage Notes (Signed)
Pt reports LLE redness/swelling for 4 days. Sent from PCP for septic workup. Hypotensive in triage.

## 2022-07-25 NOTE — Telephone Encounter (Signed)
  Chief Complaint: Right leg swollen, painful, and rash Symptoms: above Frequency: 4 days Pertinent Negatives: Patient denies Fever Disposition: [] ED /[] Urgent Care (no appt availability in office) / [x] Appointment(In office/virtual)/ []  [facility]/ [] Home Care/ [] Refused Recommended Disposition /[] [facility]/ []  Follow-up with PCP Additional Notes: Pt states that this started about 4 days ago. Right leg is swollen painful, and has a rash on his thigh. Pt was prescribed Z-pack for a similar issue awhile back. Pt was sent 3 z-packs instead of just one at that time. Pt started taking medication extra medication that was sent yesterday.  Pt also states that a little while back he was feeling poorly and had diarrhea.   Summary: leg swelling   Patient states that is left thigh and leg starting swelling 2 days ago and a rash has appeared.. Pain states he is experiencing pain. Please advise.       Reason for Disposition  SEVERE leg swelling (e.g., swelling extends above knee, entire leg is swollen, weeping fluid)  Answer Assessment - Initial Assessment Questions 1. ONSET: "When did the swelling start?" (e.g., minutes, hours, days)     4 days ago 2. LOCATION: "What part of the leg is swollen?"  "Are both legs swollen or just one leg?"     Whole leg 3. SEVERITY: "How bad is the swelling?" (e.g., localized; mild, moderate, severe)   - Localized: Small area of swelling localized to one leg.   - MILD pedal edema: Swelling limited to foot and ankle, pitting edema < 1/4 inch (6 mm) deep, rest and elevation eliminate most or all swelling.   - MODERATE edema: Swelling of lower leg to knee, pitting edema > 1/4 inch (6 mm) deep, rest and elevation only partially reduce swelling.   - SEVERE edema: Swelling extends above knee, facial or hand swelling present.      Severe 4. REDNESS: "Does the swelling look red or infected?"     Rash on leg - on top of thigh 5. PAIN: "Is the  swelling painful to touch?" If Yes, ask: "How painful is it?"   (Scale 1-10; mild, moderate or severe)     Yes it is painful 6. FEVER: "Do you have a fever?" If Yes, ask: "What is it, how was it measured, and when did it start?"      no 7. CAUSE: "What do you think is causing the leg swelling?"     infection 8. MEDICAL HISTORY: "Do you have a history of blood clots (e.g., DVT), cancer, heart failure, kidney disease, or liver failure?"     Heart failure 9. RECURRENT SYMPTOM: "Have you had leg swelling before?" If Yes, ask: "When was the last time?" "What happened that time?"     yes 10. OTHER SYMPTOMS: "Do you have any other symptoms?" (e.g., chest pain, difficulty breathing)       Diarrhea and felt poorly a few days ago  Protocols used: Leg Swelling and Edema-A-AH

## 2022-07-25 NOTE — H&P (Signed)
NAME:  Frank Love, MRN:  [id number], DOB:  [birth year], LOS: 0 ADMISSION DATE:  07/25/2022, CONSULTATION DATE:  07/25/22 REFERRING MD:  Suezanne Jacquet CHIEF COMPLAINT:  Weakness, HoTN   History of Present Illness:  Frank Love is a 67 y.o. 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 He presented to [facility] ED 7/1 with generalized weakness and feeling poorly x 3 - 4 days. Symptoms started 6/27 or 6/28 and on 6/28, wife felt he was having a stroke because he was speaking incoherently; however, this resolved on it's own. He was working outside in the heat and felt maybe symptoms were related to that. Over the next 2 days, he continued to have generalized weakness along with decreased PO intake. 7/1, he felt worse and noticed a new rash on his LLE along with a few bouts of diarrhea which prompted him to seek care from his PCP. While at PCP, SBP noted to be in 70's. He was subsequently referred to th ED for concern for sepsis in setting of LLE cellulitis. He had LLE duplex study given LLE edema and erythema. Prelim results are negative.   In ED, he received 2L IVF and BP remained soft with SBP in 60 and 70 range. Of note, he has chronic hypotension with SBP in the 100 range. Despite SBP reading 60-70 in the ED, he is completely alert and oriented. He feels a bit better after fluids. His rash extends from L ankle all the way up to the medial groin. It is not oozing. He has occasionally had similar rash which has been attributed to cellulitis. He denies any fevers/chills/sweats, chest pain, dyspnea, N/V, abd pain, myalgias. He has had decreased PO intake since 6/28. Of note, he has continued to take his meds daily since symptom onset (to include Carvedilol, furosemide, Entresto, Spironolactone).  Given ongoing hypotension, he was started on low dose Norepinephrine and PCCM asked to evaluate for ICU admission.  Pertinent  Medical  History:  has Chronic systolic CHF (congestive heart failure) (HCC); Preop cardiovascular exam; Abnormal EKG; Coronary artery disease involving native coronary artery of native heart without angina pectoris; Ischemic cardiomyopathy; S/P CABG x 4; Coronary artery disease; Debility; Postoperative anemia due to acute blood loss; Stenosis of carotid artery; Educated about COVID-19 virus infection; Primary osteoarthritis of right hip; Status post total replacement of right hip; COVID-19; Orthostatic hypotension; Hyperlipidemia; Bradycardia; and Septic shock (HCC) on their problem list.  Significant Hospital Events: Including procedures, antibiotic start and stop dates in addition to other pertinent events   7/1 admit.  Interim History / Subjective:  Comfortable, feels a bit better after fluids. SBP remains in 70s after 2L. 3rd L NS started along with 2 Levo.  Objective:  Blood pressure (!) 78/46, pulse 74, temperature 98.2 F (36.8 C), temperature source Oral, resp. rate 19, height 6\' 3"  (1.905 m), weight 131.5 kg, SpO2 98 %.        Intake/Output Summary (Last 24 hours) at 07/25/2022 2152 Last data filed at 07/25/2022 1840 Gross per 24 hour  Intake 1100 ml  Output --  Net 1100 ml   Filed Weights   07/25/22 1705  Weight: 131.5 kg    Examination: General: Adult male, resting in bed, in NAD. Neuro: A&O x 3, no deficits. HEENT: [place]/AT. Sclerae anicteric. EOMI. MM a bit dry. Cardiovascular: RRR, no M/R/G.  Lungs: Respirations even and unlabored.  CTA bilaterally, No W/R/R. Abdomen:  Obese. BS x 4, soft, NT/ND.  Musculoskeletal: LLE erythema from ankle up to medial groin, LLE edema, slight pretibial pitting. Skin: Intact, LLE rash as above, skin warm,  Labs/imaging personally reviewed:  LLE duplex 7/1 > prelim negative.  Assessment & Plan:   Septic shock 2/2 LLE cellulitis - of note, he has chronic hypotension with SBP in the 100 range. - Admit to ICU. - Additional 1L NS bolus now. -  Albumin 25% x 1 dose now. - Continue Levophed, given his chronic hypotension, would aim for MAP 55 as long as mental status remains clear. - Start Vanc/Cefepime. - Follow cultures. - Consider repeat echo given last was 2022.  Chronic combined heart failure (last echo 2022 with EF 35-40%, G2DD), CAD, HTN. - Continue home ASA, Atorvastatin. - Hold home Carvedilol, Farxiga, Furosemide, Entresto, Spironolactone.  Hyponatremia - hypovolemic. AKI. Mild AGMA. - Additional 1L NS now then maintenance at 125/hr. - Check renal US. - Follow BMP/UOP.    Best practice (evaluated daily):  Diet/type: Regular consistency (see orders) DVT prophylaxis: prophylactic heparin  GI prophylaxis: N/A Lines: N/A Foley:  N/A Code Status:  full code Last date of multidisciplinary goals of care discussion: None yet.  Labs   CBC: Recent Labs  Lab 07/25/22 1720  WBC 9.7  NEUTROABS 8.3*  HGB 13.8  HCT 43.5  MCV 87.9  PLT 140*    Basic Metabolic Panel: Recent Labs  Lab 07/25/22 1720  NA 132*  K 3.9  CL 100  CO2 16*  GLUCOSE 139*  BUN 102*  CREATININE 3.27*  CALCIUM 8.0*   GFR: Estimated Creatinine Clearance: 32 mL/min (A) (by C-G formula based on SCr of 3.27 mg/dL (H)). Recent Labs  Lab 07/25/22 1720 07/25/22 1842  WBC 9.7  --   LATICACIDVEN 1.9 1.9    Liver Function Tests: Recent Labs  Lab 07/25/22 1720  AST 44*  ALT 42  ALKPHOS 76  BILITOT 1.4*  PROT 7.1  ALBUMIN 2.9*   No results for input(s): "LIPASE", "AMYLASE" in the last 168 hours. No results for input(s): "AMMONIA" in the last 168 hours.  ABG    Component Value Date/Time   PHART 7.470 (H) 12/13/2019 2335   PCO2ART 22.4 (L) 12/13/2019 2335   PO2ART 118 (H) 12/13/2019 2335   HCO3 16.3 (L) 12/13/2019 2335   TCO2 17 (L) 12/13/2019 2335   ACIDBASEDEF 6.0 (H) 12/13/2019 2335   O2SAT 99.0 12/13/2019 2335     Coagulation Profile: No results for input(s): "INR", "PROTIME" in the last 168 hours.  Cardiac  Enzymes: No results for input(s): "CKTOTAL", "CKMB", "CKMBINDEX", "TROPONINI" in the last 168 hours.  HbA1C: HbA1c, POC (prediabetic range)  Date/Time Value Ref Range Status  05/07/2021 09:00 AM 5.8 5.7 - 6.4 % Final   HbA1c, POC (controlled diabetic range)  Date/Time Value Ref Range Status  09/16/2020 03:37 PM 5.8 0.0 - 7.0 % Final   Hgb A1c MFr Bld  Date/Time Value Ref Range Status  03/08/2022 04:35 PM 6.2 (H) 4.8 - 5.6 % Final    Comment:             Prediabetes: 5.7 - 6.4          Diabetes: >6.4          Glycemic control for adults with diabetes: <7.0   12/14/2019 05:46 AM 6.1 (H) 4.8 - 5.6 % Final    Comment:    (NOTE) Pre diabetes:          5.7%-6.4%  Diabetes:              >  6.4%  Glycemic control for   <7.0% adults with diabetes     CBG: No results for input(s): "GLUCAP" in the last 168 hours.  Review of Systems:   All negative; except for those that are bolded, which indicate positives.  Constitutional: weight loss, weight gain, night sweats, fevers, chills, fatigue, weakness.  HEENT: headaches, sore throat, sneezing, nasal congestion, post nasal drip, difficulty swallowing, tooth/dental problems, visual complaints, visual changes, ear aches. Neuro: difficulty with speech, weakness, numbness, ataxia. CV:  chest pain, orthopnea, PND, swelling in lower extremities, dizziness, palpitations, syncope.  Resp: cough, hemoptysis, dyspnea, wheezing. GI: heartburn, indigestion, abdominal pain, nausea, vomiting, diarrhea, constipation, change in bowel habits, loss of appetite, hematemesis, melena, hematochezia.  GU: dysuria, change in color of urine, urgency or frequency, flank pain, hematuria. MSK: joint pain or swelling, decreased range of motion. Psych: change in mood or affect, depression, anxiety, suicidal ideations, homicidal ideations. Skin: rash, itching, bruising, LLE edema and erythema.   Past Medical History:  He,  has a past medical history of Cancer (HCC),  CHF (congestive heart failure) (HCC), Chronic pain of right knee, Coronary artery disease, Hypertension, and Phimosis.   Surgical History:   Past Surgical History:  Procedure Laterality Date   CIRCUMCISION N/A 10/12/2018   Procedure: CIRCUMCISION ADULT;  Surgeon: Marcine Matar, MD;  Location: [facility];  Service: Urology;  Laterality: N/A;  45 mins   CO2 LASER APPLICATION N/A 04/29/2019   Procedure: CO2 LASER APPLICATION;  Surgeon: Marcine Matar, MD;  Location: [facility];  Service: Urology;  Laterality: N/A;  30 MINS   COLONOSCOPY     CORONARY ARTERY BYPASS GRAFT N/A 12/12/2018   Procedure: CORONARY ARTERY BYPASS GRAFTING (CABG) x four, using bilateral internal mammary arteries and right leg greater saphenous vein harvested endoscopically;  Surgeon: Linden Dolin, MD;  Location: [facility] OR;  Service: Open Heart Surgery;  Laterality: N/A;   LEFT HEART CATH AND CORONARY ANGIOGRAPHY N/A 11/28/2018   Procedure: LEFT HEART CATH AND CORONARY ANGIOGRAPHY;  Surgeon: Kathleene Hazel, MD;  Location: [facility] INVASIVE CV [facility];  Service: Cardiovascular;  Laterality: N/A;   MEDIASTINAL EXPLORATION N/A 12/12/2018   Procedure: Mediastinal Re-Exploration after bypass graft;  Surgeon: Linden Dolin, MD;  Location: [facility] OR;  Service: Open Heart Surgery;  Laterality: N/A;   TEE WITHOUT CARDIOVERSION N/A 12/12/2018   Procedure: TRANSESOPHAGEAL ECHOCARDIOGRAM (TEE);  Surgeon: Linden Dolin, MD;  Location: [facility] OR;  Service: Open Heart Surgery;  Laterality: N/A;   TOOTH EXTRACTION     TOTAL HIP ARTHROPLASTY Right 03/08/2019   Procedure: RIGHT TOTAL HIP ARTHROPLASTY ANTERIOR APPROACH;  Surgeon: Kathryne Hitch, MD;  Location: [facility];  Service: Orthopedics;  Laterality: Right;     Social History:   reports that he has never smoked. He has never used smokeless tobacco. He reports that he does not drink alcohol and does not use drugs.   Family History:  His family history includes Diabetes in his  mother; Heart disease in his mother.   Allergies No Known Allergies   Home Medications  Prior to Admission medications   Medication Sig Start Date End Date Taking? Authorizing Provider  aspirin EC 81 MG tablet Take 1 tablet (81 mg total) by mouth daily. Swallow whole. 06/05/20   Laurey Morale, MD  atorvastatin (LIPITOR) 40 MG tablet Take 1 tablet (40 mg total) by mouth daily at 6 PM. 08/09/21   [place], Anderson Malta, FNP  carvedilol (COREG) 3.125 MG tablet Take 1 tablet (3.125 mg total)  by mouth 2 (two) times daily with a meal. 04/20/22   [place], Anderson Malta, FNP  dapagliflozin propanediol (FARXIGA) 10 MG TABS tablet TAKE 1 TABLET (10 MG TOTAL) BY MOUTH DAILY BEFORE BREAKFAST. 06/21/22 06/21/23  Laurey Morale, MD  furosemide (LASIX) 40 MG tablet Take 1 tablet (40 mg total) by mouth daily. 01/27/22   Laurey Morale, MD  nitroGLYCERIN (NITROSTAT) 0.4 MG SL tablet Place 1 tablet (0.4 mg total) under the tongue every 5 (five) minutes as needed for chest pain. NO more than 3 pills--if you have the need to take 2 pills or more call MD 05/06/20   Laurey Morale, MD  potassium chloride SA (KLOR-CON M) 20 MEQ tablet Take 1 tablet (20 mEq total) by mouth as directed. Take 1 tablet only take when taking a extra lasix dose 04/20/22   Jacklynn Ganong, FNP  promethazine-dextromethorphan (PROMETHAZINE-DM) 6.25-15 MG/5ML syrup Take 5 mLs by mouth 4 (four) times daily as needed for cough. 03/17/22   Claiborne Rigg, NP  sacubitril-valsartan (ENTRESTO) 97-103 MG Take 1 tablet by mouth 2 (two) times daily. 04/23/21   Laurey Morale, MD  spironolactone (ALDACTONE) 25 MG tablet Take 1 tablet (25 mg total) by mouth daily. 02/25/22   Laurey Morale, MD  Vitamin D, Ergocalciferol, (DRISDOL) 1.25 MG (50000 UNIT) CAPS capsule Take 1 capsule (50,000 Units total) by mouth every 7 (seven) days. 06/19/22   Claiborne Rigg, NP     Critical care time: 45 min.   Rutherford Guys, PA - [facility] For pager details, please see AMION or use Epic chat  After 1900, please call [facility] for cross coverage needs 07/25/2022, 9:52 PM

## 2022-07-25 NOTE — ED Notes (Signed)
Stopped fluids per Mickle Mallory, MD for CHF.

## 2022-07-25 NOTE — Progress Notes (Signed)
Left lower extremity venous study completed.   Preliminary results relayed to MD in ER.  Please see CV Procedures for preliminary results.  Twinkle Sockwell, RVT  6:09 PM 07/25/22

## 2022-07-25 NOTE — ED Notes (Signed)
Dr.Scheving aware of pt bp. Currently getting 2nd LR bolus bag. Pt remains alert and oriented x 4. Will continue to monitor.

## 2022-07-25 NOTE — ED Notes (Signed)
IV team present placing IV Watch for vasopressors.

## 2022-07-25 NOTE — ED Provider Notes (Signed)
[place] EMERGENCY DEPARTMENT AT [facility] Provider Note  CSN: [id number] Arrival date & time: 07/25/22 1643  Chief Complaint(s) Hypotension  HPI Frank Love is a 67 y.o. male history of CHF, coronary artery disease presenting to the emergency department with left leg redness.  Reports chronic lower extremity swelling, but today noticed rash and redness.  He reports that he has been had increased weakness over the past 4 days, lightheadedness.  No cough, dysuria, abdominal pain, nausea or vomiting.  He does report around 3 episodes of diarrhea day no blood in his stool.  Had chills a few days ago but no fevers at home.  Reports generalized weakness, no focal weakness.   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   Primary osteoarthritis of right hip 02/08/2019   Stenosis of carotid artery 01/02/2019   Educated about COVID-19 virus infection 01/02/2019   Postoperative anemia due to acute blood loss 12/31/2018   Debility 12/21/2018   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 Authorizing Provider  aspirin EC 81 MG tablet Take 1 tablet (81 mg total) by mouth daily. Swallow  whole. 06/05/20   Laurey Morale, MD  atorvastatin (LIPITOR) 40 MG tablet Take 1 tablet (40 mg total) by mouth daily at 6 PM. 08/09/21   [place], Anderson Malta, FNP  carvedilol (COREG) 3.125 MG tablet Take 1 tablet (3.125 mg total) by mouth 2 (two) times daily with a meal. 04/20/22   [place], Anderson Malta, FNP  dapagliflozin propanediol (FARXIGA) 10 MG TABS tablet TAKE 1 TABLET (10 MG TOTAL) BY MOUTH DAILY BEFORE BREAKFAST. 06/21/22 06/21/23  Laurey Morale, MD  furosemide (LASIX) 40 MG tablet Take 1 tablet (40 mg total) by mouth daily. 01/27/22   Laurey Morale, MD  nitroGLYCERIN (NITROSTAT) 0.4 MG SL tablet Place 1 tablet (0.4 mg total) under the tongue every 5 (five) minutes as needed for chest pain. NO more than 3 pills--if you have the need to take 2 pills or more call MD 05/06/20   Laurey Morale, MD  potassium chloride SA (KLOR-CON M) 20 MEQ tablet Take 1 tablet (20 mEq total) by mouth as directed. Take 1 tablet only take when taking a extra lasix dose 04/20/22   Jacklynn Ganong, FNP  promethazine-dextromethorphan (PROMETHAZINE-DM) 6.25-15 MG/5ML syrup Take 5 mLs by mouth 4 (four) times daily as needed for cough. 03/17/22   Claiborne Rigg, NP  sacubitril-valsartan (ENTRESTO) 97-103 MG Take 1 tablet by mouth 2 (two) times daily. 04/23/21   Laurey Morale, MD  spironolactone (ALDACTONE) 25 MG tablet Take 1 tablet (25 mg total) by mouth daily. 02/25/22  Laurey Morale, MD  Vitamin D, Ergocalciferol, (DRISDOL) 1.25 MG (50000 UNIT) CAPS capsule Take 1 capsule (50,000 Units total) by mouth every 7 (seven) days. 06/19/22   Claiborne Rigg, NP                                                                                                                                    Past Surgical History Past Surgical History:  Procedure Laterality Date   CIRCUMCISION N/A 10/12/2018   Procedure: CIRCUMCISION ADULT;  Surgeon: Marcine Matar, MD;  Location: [facility];  Service: Urology;  Laterality: N/A;  45 mins    CO2 LASER APPLICATION N/A 04/29/2019   Procedure: CO2 LASER APPLICATION;  Surgeon: Marcine Matar, MD;  Location: [facility];  Service: Urology;  Laterality: N/A;  30 MINS   COLONOSCOPY     CORONARY ARTERY BYPASS GRAFT N/A 12/12/2018   Procedure: CORONARY ARTERY BYPASS GRAFTING (CABG) x four, using bilateral internal mammary arteries and right leg greater saphenous vein harvested endoscopically;  Surgeon: Linden Dolin, MD;  Location: [facility] OR;  Service: Open Heart Surgery;  Laterality: N/A;   LEFT HEART CATH AND CORONARY ANGIOGRAPHY N/A 11/28/2018   Procedure: LEFT HEART CATH AND CORONARY ANGIOGRAPHY;  Surgeon: Kathleene Hazel, MD;  Location: [facility] INVASIVE CV LAB;  Service: Cardiovascular;  Laterality: N/A;   MEDIASTINAL EXPLORATION N/A 12/12/2018   Procedure: Mediastinal Re-Exploration after bypass graft;  Surgeon: Linden Dolin, MD;  Location: [facility] OR;  Service: Open Heart Surgery;  Laterality: N/A;   TEE WITHOUT CARDIOVERSION N/A 12/12/2018   Procedure: TRANSESOPHAGEAL ECHOCARDIOGRAM (TEE);  Surgeon: Linden Dolin, MD;  Location: [facility] OR;  Service: Open Heart Surgery;  Laterality: N/A;   TOOTH EXTRACTION     TOTAL HIP ARTHROPLASTY Right 03/08/2019   Procedure: RIGHT TOTAL HIP ARTHROPLASTY ANTERIOR APPROACH;  Surgeon: Kathryne Hitch, MD;  Location: [facility];  Service: Orthopedics;  Laterality: Right;   Family History Family History  Problem Relation Age of Onset   Diabetes Mother    Heart disease Mother     Social History Social History   Tobacco Use   Smoking status: Never   Smokeless tobacco: Never  Vaping Use   Vaping Use: Never used  Substance Use Topics   Alcohol use: No    Alcohol/week: 0.0 standard drinks of alcohol   Drug use: No   Allergies Patient has no known allergies.  Review of Systems Review of Systems  All other systems reviewed and are negative.   Physical Exam Vital Signs  I have reviewed the triage vital signs BP (!) 78/46   Pulse 74    Temp 98.2 F (36.8 C) (Oral)   Resp 19   Ht 6\' 3"  (1.905 m)   Wt 131.5 kg   SpO2 98%   BMI 36.25 kg/m  Physical Exam Vitals and nursing note reviewed.  Constitutional:      General: He is not  in acute distress.    Appearance: Normal appearance.  HENT:     Mouth/Throat:     Mouth: Mucous membranes are dry.  Eyes:     Conjunctiva/sclera: Conjunctivae normal.  Cardiovascular:     Rate and Rhythm: Normal rate and regular rhythm.  Pulmonary:     Effort: Pulmonary effort is normal. No respiratory distress.     Breath sounds: Normal breath sounds.  Abdominal:     General: Abdomen is flat.     Palpations: Abdomen is soft.     Tenderness: There is no abdominal tenderness.  Musculoskeletal:        General: Tenderness (mild) present.     Right lower leg: No edema.     Left lower leg: Edema (with erythema, warmth over the calf and anterior shin extending up medial thigh, no crepitus) present.  Skin:    General: Skin is warm and dry.     Capillary Refill: Capillary refill takes less than 2 seconds.  Neurological:     Mental Status: He is alert and oriented to person, place, and time. Mental status is at baseline.  Psychiatric:        Mood and Affect: Mood normal.        Behavior: Behavior normal.     ED Results and Treatments Labs (all labs ordered are listed, but only abnormal results are displayed) Labs Reviewed  COMPREHENSIVE METABOLIC PANEL - Abnormal; Notable for the following components:      Result Value   Sodium 132 (*)    CO2 16 (*)    Glucose, Bld 139 (*)    BUN 102 (*)    Creatinine, Ser 3.27 (*)    Calcium 8.0 (*)    Albumin 2.9 (*)    AST 44 (*)    Total Bilirubin 1.4 (*)    GFR, Estimated 20 (*)    Anion gap 16 (*)    All other components within normal limits  CBC WITH DIFFERENTIAL/PLATELET - Abnormal; Notable for the following components:   Platelets 140 (*)    Neutro Abs 8.3 (*)    Lymphs Abs 0.5 (*)    Abs Immature Granulocytes 0.10 (*)    All  other components within normal limits  CULTURE, BLOOD (ROUTINE X 2)  CULTURE, BLOOD (ROUTINE X 2)  LACTIC ACID, PLASMA  LACTIC ACID, PLASMA  URINALYSIS, W/ REFLEX TO CULTURE (INFECTION SUSPECTED)  PROTIME-INR  HIV ANTIBODY (ROUTINE TESTING W REFLEX)  CBC  BASIC METABOLIC PANEL  MAGNESIUM  PHOSPHORUS                                                                                                                          Radiology DG Chest Portable 1 View  Result Date: 07/25/2022 CLINICAL DATA:  hypotension EXAM: PORTABLE CHEST 1 VIEW COMPARISON:  Chest x-ray December 13, 2019. FINDINGS: Left basilar opacities. Possible small left pleural effusion. No visible pneumothorax. Enlarged cardiac silhouette. Median sternotomy and CABG. IMPRESSION: Left basilar opacities  which could represent atelectasis, aspiration, and/or pneumonia. Possible small left pleural effusion. Dedicated PA and lateral radiographs could better assess if clinically warranted. Electronically Signed   By: Feliberto Harts M.D.   On: 07/25/2022 18:26   VAS [place] LOWER EXTREMITY VENOUS (DVT) (7a-7p)  Result Date: 07/25/2022  Lower Venous DVT Study Patient Name:  TRAI NASH  Date of Exam:   07/25/2022 Medical Rec #: [id number]           Accession #:    [id number] Date of Birth: 11/12/1955           Patient Gender: M Patient Age:   60 years Exam Location:  [facility] Procedure:      VAS [place] LOWER EXTREMITY VENOUS (DVT) Referring Phys: Alvino Blood --------------------------------------------------------------------------------  Indications: Swelling.  Limitations: Body habitus and poor ultrasound/tissue interface. Comparison Study: Previous study 03/09/22 negative. Performing Technologist: McKayla Maag RVT, VT  Examination Guidelines: A complete evaluation includes B-mode imaging, spectral Doppler, color Doppler, and power Doppler as needed of all accessible portions of each vessel. Bilateral testing is considered an  integral part of a complete examination. Limited examinations for reoccurring indications may be performed as noted. The reflux portion of the exam is performed with the patient in reverse Trendelenburg.  +-----+---------------+---------+-----------+----------+--------------+ RIGHTCompressibilityPhasicitySpontaneityPropertiesThrombus Aging +-----+---------------+---------+-----------+----------+--------------+ CFV  Full           Yes      Yes                                 +-----+---------------+---------+-----------+----------+--------------+ SFJ  Full                                                        +-----+---------------+---------+-----------+----------+--------------+   +---------+---------------+---------+-----------+----------+-------------------+ LEFT     CompressibilityPhasicitySpontaneityPropertiesThrombus Aging      +---------+---------------+---------+-----------+----------+-------------------+ CFV      Full           Yes      Yes                                      +---------+---------------+---------+-----------+----------+-------------------+ SFJ      Full                                                             +---------+---------------+---------+-----------+----------+-------------------+ FV Prox  Full                                                             +---------+---------------+---------+-----------+----------+-------------------+ FV Mid   Full                                                             +---------+---------------+---------+-----------+----------+-------------------+  FV DistalFull                                                             +---------+---------------+---------+-----------+----------+-------------------+ PFV      Full                                                             +---------+---------------+---------+-----------+----------+-------------------+ POP      Full            Yes      Yes                                      +---------+---------------+---------+-----------+----------+-------------------+ PTV      Full                                         Not well visualized +---------+---------------+---------+-----------+----------+-------------------+ PERO     Full                                         Not well visualized +---------+---------------+---------+-----------+----------+-------------------+    Summary: RIGHT: - No evidence of common femoral vein obstruction.  LEFT: - There is no evidence of deep vein thrombosis in the lower extremity. However, portions of this examination were limited- see technologist comments above.  - No cystic structure found in the popliteal fossa.  *See table(s) above for measurements and observations.    Preliminary     Pertinent labs & imaging results that were available during my care of the patient were reviewed by me and considered in my medical decision making (see MDM for details).  Medications Ordered in ED Medications  lactated ringers infusion (0 mLs Intravenous Stopped 07/25/22 2053)  vancomycin variable dose per unstable renal function (pharmacist dosing) (has no administration in time range)  0.9 %  sodium chloride infusion (250 mLs Intravenous New Bag/Given 07/25/22 2120)  norepinephrine (LEVOPHED) 4mg  in (0.016 mg/mL) premix infusion (2 mcg/min Intravenous New Bag/Given 07/25/22 2122)  albumin human 25 % solution 12.5 g (has no administration in time range)  heparin injection 5,000 Units (has no administration in time range)  docusate sodium (COLACE) capsule 100 mg (has no administration in time range)  polyethylene glycol (MIRALAX / GLYCOLAX) packet 17 g (has no administration in time range)  aspirin chewable tablet 81 mg (has no administration in time range)  atorvastatin (LIPITOR) tablet 40 mg (has no administration in time range)  lactated ringers bolus 1,000 mL (0 mLs Intravenous Stopped  07/25/22 1840)  cefTRIAXone (ROCEPHIN) 2 g in sodium chloride 0.9 % 100 mL IVPB (0 g Intravenous Stopped 07/25/22 1827)  lactated ringers bolus 1,000 mL (0 mLs Intravenous Stopped 07/25/22 2053)  vancomycin (VANCOREADY) IVPB 2000 mg/400 mL (0 mg Intravenous Stopped 07/25/22 2130)  sodium chloride 0.9 % bolus 1,000 mL (1,000 mLs Intravenous New Bag/Given 07/25/22  2128)                                                                                                                                     Procedures .Critical Care  Performed by: Lonell Grandchild, MD Authorized by: Lonell Grandchild, MD   Critical care provider statement:    Critical care time (minutes):  30   Critical care was necessary to treat or prevent imminent or life-threatening deterioration of the following conditions:  Shock and sepsis   Critical care was time spent personally by me on the following activities:  Development of treatment plan with patient or surrogate, discussions with consultants, evaluation of patient's response to treatment, examination of patient, ordering and review of laboratory studies, ordering and review of radiographic studies, ordering and performing treatments and interventions, pulse oximetry, re-evaluation of patient's condition and review of old charts   Care discussed with: admitting provider     (including critical care time)  Medical Decision Making / ED Course   MDM:  67 year old male presenting with generalized weakness, rash.  On exam, patient has area of redness, erythema to the left lower extremity.  No crepitus, minimally tender.  DVT ultrasound negative.  Suspect likely cellulitis.  Doubt necrotizing infection.  Patient also dehydrated appearing.  Not febrile but concern for possible sepsis.  Blood cultures obtained, code sepsis activated.  Patient given initial small fluid bolus given CHF, will give additional fluid bolus for persistent hypotension.  Lactate is normal which is  reassuring.  Chest x-ray shows possible pneumonia but patient not having cough, suspect this is less likely.  Pending urinalysis as well.  He does report diarrhea but only around 3 episodes daily.  His abdomen is soft, doubt acute intra-abdominal process.  Given hypotension, signs of infection patient will need to be admitted.  Clinical Course as of 07/25/22 2155  Mon Jul 25, 2022  2109 Patient received around 2 L of fluid, initially had improvement in blood pressure but now having drop in blood pressure.  Discussed with hospitalist Dr. Buelah Manis, initially was going to admit the patient but given drop, request ICU consult.  Think this is very reasonable so I consulted ICU.  Will start norepinephrine.  Given patient's chronic heart failure will need careful fluid management [WS]  2154 Patient admitted to critical care [WS]    Clinical Course User Index [WS] Lonell Grandchild, MD     Additional history obtained: -Additional history obtained from spouse -External records from outside source obtained and reviewed including: Chart review including previous notes, labs, imaging, consultation notes including primary note today   Lab Tests: -I ordered, reviewed, and interpreted labs.   The pertinent results include:   Labs Reviewed  COMPREHENSIVE METABOLIC PANEL - Abnormal; Notable for the following components:      Result Value   Sodium 132 (*)    CO2 16 (*)    Glucose, Bld 139 (*)  BUN 102 (*)    Creatinine, Ser 3.27 (*)    Calcium 8.0 (*)    Albumin 2.9 (*)    AST 44 (*)    Total Bilirubin 1.4 (*)    GFR, Estimated 20 (*)    Anion gap 16 (*)    All other components within normal limits  CBC WITH DIFFERENTIAL/PLATELET - Abnormal; Notable for the following components:   Platelets 140 (*)    Neutro Abs 8.3 (*)    Lymphs Abs 0.5 (*)    Abs Immature Granulocytes 0.10 (*)    All other components within normal limits  CULTURE, BLOOD (ROUTINE X 2)  CULTURE, BLOOD (ROUTINE X 2)  LACTIC  ACID, PLASMA  LACTIC ACID, PLASMA  URINALYSIS, W/ REFLEX TO CULTURE (INFECTION SUSPECTED)  PROTIME-INR  HIV ANTIBODY (ROUTINE TESTING W REFLEX)  CBC  BASIC METABOLIC PANEL  MAGNESIUM  PHOSPHORUS    Notable for no leukocytosis, AKI present  EKG   EKG Interpretation Date/Time:  Monday July 25 2022 17:25:14 EDT Ventricular Rate:  53 PR Interval:    QRS Duration:  151 QT Interval:  484 QTC Calculation: 455 R Axis:   268  Text Interpretation: Sinus bradycardia Ventricular bigeminy Nonspecific IVCD with LAD Anterolateral infarct, age indeterminate Confirmed by Alvino Blood ([id number]) on 07/25/2022 5:49:29 PM         Imaging Studies ordered: I ordered imaging studies including DVT ultrasound, chest x-ray On my interpretation imaging demonstrates no DVT, low concern for pneumonia I independently visualized and interpreted imaging. I agree with the radiologist interpretation   Medicines ordered and prescription drug management: Meds ordered this encounter  Medications   lactated ringers infusion   lactated ringers bolus 1,000 mL   cefTRIAXone (ROCEPHIN) 2 g in sodium chloride 0.9 % 100 mL IVPB    Order Specific Question:   Antibiotic Indication:    Answer:   Cellulitis   DISCONTD: sodium chloride 0.9 % bolus 1,000 mL   lactated ringers bolus 1,000 mL   vancomycin (VANCOREADY) IVPB 2000 mg/400 mL    Order Specific Question:   Indication:    Answer:   Cellulitis   vancomycin variable dose per unstable renal function (pharmacist dosing)   0.9 %  sodium chloride infusion   norepinephrine (LEVOPHED) 4mg  in (0.016 mg/mL) premix infusion    Order Specific Question:   IV Access    Answer:   Peripheral   DISCONTD: sodium chloride 0.9 % bolus 500 mL   albumin human 25 % solution 12.5 g   sodium chloride 0.9 % bolus 1,000 mL   heparin injection 5,000 Units   docusate sodium (COLACE) capsule 100 mg   polyethylene glycol (MIRALAX / GLYCOLAX) packet 17 g   aspirin chewable  tablet 81 mg   atorvastatin (LIPITOR) tablet 40 mg    -I have reviewed the patients home medicines and have made adjustments as needed   Consultations Obtained: I requested consultation with the ICU physician and hospitalist, and discussed lab and imaging findings as well as pertinent plan - they recommend: admit to ICU    Cardiac Monitoring: The patient was maintained on a cardiac monitor.  I personally viewed and interpreted the cardiac monitored which showed an underlying rhythm of: NSR  Social Determinants of Health:  Diagnosis or treatment significantly limited by social determinants of health: obesity   Reevaluation: After the interventions noted above, I reevaluated the patient and found that their symptoms have improved  Co morbidities that complicate the patient evaluation  Past Medical History:  Diagnosis Date   Cancer (HCC)    penile cancer   CHF (congestive heart failure) (HCC)    Chronic pain of right knee    Coronary artery disease    Hypertension    Phimosis       Dispostion: Disposition decision including need for hospitalization was considered, and patient admitted to the hospital.    Final Clinical Impression(s) / ED Diagnoses Final diagnoses:  Cellulitis of left lower extremity  Sepsis with acute renal failure and septic shock, due to unspecified organism, unspecified acute renal failure type [facility])     This chart was dictated using voice recognition software.  Despite best efforts to proofread,  errors can occur which can change the documentation meaning.    Lonell Grandchild, MD 07/25/22 2155

## 2022-07-25 NOTE — Progress Notes (Signed)
Pharmacy Antibiotic Note  Frank Love is a 67 y.o. male admitted on 07/25/2022 with cellulitis.  Pharmacy has been consulted for vancomycin dosing. SCr elevated at 3.27, most recently 1.53 on 06/15/22.  Plan: Vancomycin 2g IV x 1 Follow up SCr in AM and consider checking vancomycin level 24hr post dose Follow up cultures, clinical course, duration of therapy  Height: 6\' 3"  (190.5 cm) Weight: 131.5 kg (290 lb) IBW/kg (Calculated) : 84.5  Temp (24hrs), Avg:97.7 F (36.5 C), Min:97.7 F (36.5 C), Max:97.7 F (36.5 C)  Recent Labs  Lab 07/25/22 1720  WBC 9.7  CREATININE 3.27*  LATICACIDVEN 1.9    Estimated Creatinine Clearance: 32 mL/min (A) (by C-G formula based on SCr of 3.27 mg/dL (H)).    No Known Allergies  Antimicrobials this admission: 7/1 CTX 2g x 1 7/1 Vancomycin >>  Dose adjustments this admission:  Microbiology results: 7/1 BCx:  Thank you for allowing pharmacy to be a part of this patient's care.  Loralee Pacas, PharmD, BCPS 07/25/2022 7:09 PM  Please check AMION for all [facility] phone numbers After 10:00 PM, call Main Pharmacy [phone]

## 2022-07-25 NOTE — Progress Notes (Signed)
Pharmacy Antibiotic Note  Frank Love is a 67 y.o. male admitted on 07/25/2022 with cellulitis.  Pharmacy has been consulted for vancomycin dosing. SCr elevated at 3.27, most recently 1.53 on 06/15/22.  Plan: Vancomycin 2g IV x 1 Follow up SCr in AM and consider checking vancomycin level 24hr post dose Follow up cultures, clinical course, duration of therapy  Height: 6\' 3"  (190.5 cm) Weight: 131.5 kg (290 lb) IBW/kg (Calculated) : 84.5  Temp (24hrs), Avg:98 F (36.7 C), Min:97.7 F (36.5 C), Max:98.2 F (36.8 C)  Recent Labs  Lab 07/25/22 1720 07/25/22 1842  WBC 9.7  --   CREATININE 3.27*  --   LATICACIDVEN 1.9 1.9     Estimated Creatinine Clearance: 32 mL/min (A) (by C-G formula based on SCr of 3.27 mg/dL (H)).    No Known Allergies  Antimicrobials this admission: 7/1 CTX 2g x 1 7/1 Vancomycin >>  Dose adjustments this admission:  Microbiology results: 7/1 BCx:  Thank you for allowing pharmacy to be a part of this patient's care.  Frank Love, PharmD, BCPS 07/25/2022 10:37 PM  Please check AMION for all [facility] phone numbers After 10:00 PM, call Main Pharmacy [phone]   [facility] Pharmacy consulted to add cefepime for cellulitis.  Plan: Cefepime 2g IV q12h Monitor clinical progress, c/s, renal function F/u de-escalation plan/LOT, vancomycin levels as indicated   Frank Love, PharmD, BCPS Please check AMION for all [facility] contact numbers Clinical Pharmacist 07/25/2022 10:38 PM

## 2022-07-25 NOTE — Telephone Encounter (Signed)
Call placed to [facility] spoke with Asher Muir, Forensic scientist. Frank Love that patient is headed to [facility] ED after being assessed by PCP. Blood Pressure at office 70/40, + LLE cellulitis ,  and + Gastroenteritis. HX of CHF, hypertension, chronic venous insufficiency. Patient has  symptoms of loss of appetite, decreased fluid intake, and diarrhea since Friday. Advised per PCP assess for sepsis. Patient is being sent to ED via private transportation via spouse. Please see office notes from today visit.

## 2022-07-25 NOTE — Progress Notes (Signed)
An USGPIV (ultrasound guided PIV) has been placed for short-term vasopressor infusion. A correctly placed ivWatch must be used when administering vasopressors. Should this treatment be needed beyond 72 hours, central line access should be obtained.  It will be the responsibility of the bedside nurse to follow best practice to prevent extravasations.   

## 2022-07-25 NOTE — Progress Notes (Signed)
Left leg swelling redness and rash  Has not ate or drank since friday

## 2022-07-25 NOTE — Sepsis Progress Note (Signed)
Elink monitoring for the code sepsis protocol.  

## 2022-07-25 NOTE — Progress Notes (Signed)
Subjective:  Patient ID: Frank Love, male    DOB: [birth year]  Age: 67 y.o. MRN: [id number]  CC: Leg Swelling   HPI Frank Love is a 67 y.o. year old male patient of Bertram Denver, FNP with a history of CHF, hypertension, chronic venous insufficiency here for an acute visit.  Interval History: Discussed the use of AI scribe software for clinical note transcription with the patient, who gave verbal consent to proceed.   The patient presents with a rash and low blood pressure. He reports that his blood pressure has been consistently low for the past two years, with readings as low as 70/40 today. The patient's spouse reports that the patient has been feeling unwell since Friday, with symptoms including loss of appetite, decreased fluid intake, and diarrhea. He also reports feeling weak and nauseated since Friday morning.  On Friday, the patient's spouse noticed that the patient was not making sense when speaking and thought he might be having a stroke. The patient has been sleeping a lot and has had diarrhea since Friday. The patient's spouse also noticed a rash on the patient's body this morning, which was not present before. The patient took an antibiotic (azithromycin) last night, which was prescribed for a similar issue three to four months ago. The patient's spouse is unsure if the rash is related to the antibiotic or not.  The patient denies having a fever or abdominal pain. He reports feeling like something is weighing him down. He has been trying to increase his fluid intake but has been struggling due to his lack of appetite.        Past Medical History:  Diagnosis Date   Cancer (HCC)    penile cancer   CHF (congestive heart failure) (HCC)    Chronic pain of right knee    Coronary artery disease    Hypertension    Phimosis     Past Surgical History:  Procedure Laterality Date   CIRCUMCISION N/A 10/12/2018   Procedure: CIRCUMCISION ADULT;  Surgeon: Marcine Matar, MD;  Location: [facility];  Service: Urology;  Laterality: N/A;  45 mins   CO2 LASER APPLICATION N/A 04/29/2019   Procedure: CO2 LASER APPLICATION;  Surgeon: Marcine Matar, MD;  Location: [facility];  Service: Urology;  Laterality: N/A;  30 MINS   COLONOSCOPY     CORONARY ARTERY BYPASS GRAFT N/A 12/12/2018   Procedure: CORONARY ARTERY BYPASS GRAFTING (CABG) x four, using bilateral internal mammary arteries and right leg greater saphenous vein harvested endoscopically;  Surgeon: Linden Dolin, MD;  Location: [facility] OR;  Service: Open Heart Surgery;  Laterality: N/A;   LEFT HEART CATH AND CORONARY ANGIOGRAPHY N/A 11/28/2018   Procedure: LEFT HEART CATH AND CORONARY ANGIOGRAPHY;  Surgeon: Kathleene Hazel, MD;  Location: [facility] INVASIVE CV [facility];  Service: Cardiovascular;  Laterality: N/A;   MEDIASTINAL EXPLORATION N/A 12/12/2018   Procedure: Mediastinal Re-Exploration after bypass graft;  Surgeon: Linden Dolin, MD;  Location: [facility] OR;  Service: Open Heart Surgery;  Laterality: N/A;   TEE WITHOUT CARDIOVERSION N/A 12/12/2018   Procedure: TRANSESOPHAGEAL ECHOCARDIOGRAM (TEE);  Surgeon: Linden Dolin, MD;  Location: [facility] OR;  Service: Open Heart Surgery;  Laterality: N/A;   TOOTH EXTRACTION     TOTAL HIP ARTHROPLASTY Right 03/08/2019   Procedure: RIGHT TOTAL HIP ARTHROPLASTY ANTERIOR APPROACH;  Surgeon: Kathryne Hitch, MD;  Location: [facility];  Service: Orthopedics;  Laterality: Right;    Family History  Problem Relation Age of Onset  Diabetes Mother    Heart disease Mother     Social History   Socioeconomic History   Marital status: Married    Spouse name: Not on file   Number of children: Not on file   Years of education: Not on file   Highest education level: Not on file  Occupational History   Not on file  Tobacco Use   Smoking status: Never   Smokeless tobacco: Never  Vaping Use   Vaping Use: Never used  Substance and Sexual Activity   Alcohol use: No     Alcohol/week: 0.0 standard drinks of alcohol   Drug use: No   Sexual activity: Yes  Other Topics Concern   Not on file  Social History Narrative   Lives with wife.     Social Determinants of Health   Financial Resource Strain: High Risk (12/28/2021)   Overall Financial Resource Strain (CARDIA)    Difficulty of Paying Living Expenses: Hard  Food Insecurity: Not on file  Transportation Needs: Not on file  Physical Activity: Not on file  Stress: Not on file  Social Connections: Not on file    No Known Allergies  Outpatient Medications Prior to Visit  Medication Sig Dispense Refill   aspirin EC 81 MG tablet Take 1 tablet (81 mg total) by mouth daily. Swallow whole. 90 tablet 3   atorvastatin (LIPITOR) 40 MG tablet Take 1 tablet (40 mg total) by mouth daily at 6 PM. 90 tablet 30   carvedilol (COREG) 3.125 MG tablet Take 1 tablet (3.125 mg total) by mouth 2 (two) times daily with a meal. 60 tablet 8   dapagliflozin propanediol (FARXIGA) 10 MG TABS tablet TAKE 1 TABLET (10 MG TOTAL) BY MOUTH DAILY BEFORE BREAKFAST. 90 tablet 3   furosemide (LASIX) 40 MG tablet Take 1 tablet (40 mg total) by mouth daily. 30 tablet 3   nitroGLYCERIN (NITROSTAT) 0.4 MG SL tablet Place 1 tablet (0.4 mg total) under the tongue every 5 (five) minutes as needed for chest pain. NO more than 3 pills--if you have the need to take 2 pills or more call MD 30 tablet 3   potassium chloride SA (KLOR-CON M) 20 MEQ tablet Take 1 tablet (20 mEq total) by mouth as directed. Take 1 tablet only take when taking a extra lasix dose 30 tablet 0   promethazine-dextromethorphan (PROMETHAZINE-DM) 6.25-15 MG/5ML syrup Take 5 mLs by mouth 4 (four) times daily as needed for cough. 240 mL 0   sacubitril-valsartan (ENTRESTO) 97-103 MG Take 1 tablet by mouth 2 (two) times daily. 180 tablet 3   spironolactone (ALDACTONE) 25 MG tablet Take 1 tablet (25 mg total) by mouth daily. 30 tablet 6   Vitamin D, Ergocalciferol, (DRISDOL) 1.25 MG  (50000 UNIT) CAPS capsule Take 1 capsule (50,000 Units total) by mouth every 7 (seven) days. 12 capsule 1   No facility-administered medications prior to visit.     ROS Review of Systems  Constitutional:  Positive for appetite change. Negative for activity change and fever.  HENT:  Negative for sinus pressure and sore throat.   Respiratory:  Negative for chest tightness, shortness of breath and wheezing.   Cardiovascular:  Positive for leg swelling. Negative for chest pain and palpitations.  Gastrointestinal:  Positive for diarrhea. Negative for abdominal distention, abdominal pain and constipation.  Genitourinary: Negative.   Musculoskeletal: Negative.   Skin:  Positive for rash.  Neurological:  Positive for dizziness and light-headedness.  Psychiatric/Behavioral:  Negative for behavioral problems and dysphoric  mood.     Objective:  BP (!) 70/40   Pulse 60   Ht 6\' 3"  (1.905 m)   Wt 290 lb 9.6 oz (131.8 kg)   SpO2 96%   BMI 36.32 kg/m      07/25/2022    3:32 PM 06/15/2022   11:04 AM 06/01/2022   11:23 AM  BP/Weight  Systolic BP 70 103 111  Diastolic BP 40 54 71  Wt. (Lbs) 290.6 298.6 298  BMI 36.32 kg/m2 37.32 kg/m2 37.25 kg/m2      Physical Exam Constitutional:      Appearance: He is well-developed.  Cardiovascular:     Rate and Rhythm: Normal rate.     Heart sounds: Normal heart sounds. No murmur heard. Pulmonary:     Effort: Pulmonary effort is normal.     Breath sounds: Normal breath sounds. No wheezing or rales.  Chest:     Chest wall: No tenderness.  Abdominal:     General: Bowel sounds are normal. There is no distension.     Palpations: Abdomen is soft. There is no mass.     Tenderness: There is no abdominal tenderness.  Musculoskeletal:        General: Normal range of motion.     Right lower leg: No edema.     Left lower leg: Edema present.  Skin:    Comments: Warm erythematous rash extending from lower half of left thigh to posterior medial left leg   Neurological:     Mental Status: He is alert and oriented to person, place, and time.  Psychiatric:        Mood and Affect: Mood normal.        Latest Ref Rng & Units 06/15/2022    4:54 PM 05/05/2022    8:24 AM 04/20/2022   11:41 AM  CMP  Glucose 70 - 99 mg/dL 409  811  914   BUN 8 - 27 mg/dL 31  29  36   Creatinine 0.76 - 1.27 mg/dL 7.82  9.56  2.13   Sodium 134 - 144 mmol/L 143  138  141   Potassium 3.5 - 5.2 mmol/L 4.1  4.0  4.0   Chloride 96 - 106 mmol/L 105  108  102   CO2 20 - 29 mmol/L 21  23  23    Calcium 8.6 - 10.2 mg/dL 8.7  8.2  8.8   Total Protein 6.0 - 8.5 g/dL 6.9     Total Bilirubin 0.0 - 1.2 mg/dL 0.7     Alkaline Phos 44 - 121 IU/L 88     AST 0 - 40 IU/L 18     ALT 0 - 44 IU/L 16       Lipid Panel     Component Value Date/Time   CHOL 118 04/20/2022 1141   CHOL 182 08/03/2018 1024   TRIG 123 04/20/2022 1141   HDL 40 (L) 04/20/2022 1141   HDL 53 08/03/2018 1024   CHOLHDL 3.0 04/20/2022 1141   VLDL 25 04/20/2022 1141   LDLCALC 53 04/20/2022 1141   LDLCALC 116 (H) 08/03/2018 1024    CBC    Component Value Date/Time   WBC 6.2 03/08/2022 1635   WBC 6.1 03/16/2020 1230   RBC 4.67 03/08/2022 1635   RBC 4.55 03/16/2020 1230   HGB 13.0 03/08/2022 1635   HCT 39.7 03/08/2022 1635   PLT 251 03/08/2022 1635   MCV 85 03/08/2022 1635   MCH 27.8 03/08/2022 1635   MCH 27.3  03/16/2020 1230   MCHC 32.7 03/08/2022 1635   MCHC 31.3 03/16/2020 1230   RDW 13.7 03/08/2022 1635   LYMPHSABS 0.9 03/08/2022 1635   MONOABS 0.3 12/27/2019 0310   EOSABS 0.5 (H) 03/08/2022 1635   BASOSABS 0.1 03/08/2022 1635    Lab Results  Component Value Date   HGBA1C 6.2 (H) 03/08/2022    Assessment & Plan:      Cellulitis: In the setting of chronic venous insufficiency.  New onset rash on the leg, warm to touch, with associated systemic symptoms including decreased appetite, decreased oral intake, and diarrhea since 07/22/2022. Concern for sepsis given hypotension (BP 70/40)  and possible dehydration. -Immediate transfer to the emergency department for IV fluids and antibiotics. -He will need blood work to assess for sepsis and organ function, particularly renal function.  Hypotension: Chronic history of low blood pressure, currently on Spironolactone 25mg  and Cozaar. Today's BP significantly lower than usual, likely secondary to dehydration and possible sepsis. -Consider reducing Spironolactone to 12.5mg  once patient is stabilized and infection is treated.   Gastroenteritis: With evidence of dehydration, poor oral intake.  Will need to evaluate for AKI.  We have called the ED to provide report and he will be going there right away.  We have provided oral hydration in the clinic.  Follow-up: After discharge from the hospital.           No orders of the defined types were placed in this encounter.   Follow-up: Return in about 2 weeks (around 08/08/2022) for ER f/u.       Hoy Register, MD, FAAFP. [facility] [place], [place] [phone]   07/25/2022, 4:18 PM

## 2022-07-25 NOTE — Patient Instructions (Signed)
VISIT SUMMARY:  During your visit today, we discussed your recent symptoms including a rash, low blood pressure, loss of appetite, decreased fluid intake, and diarrhea. We are concerned that these symptoms may be related to an infection in your skin (cellulitis) that could be spreading to the rest of your body (sepsis). We also discussed your chronic low blood pressure, which may be worsened by your current illness and dehydration.  YOUR PLAN:  -CELLULITIS AND POSSIBLE SEPSIS: Cellulitis is a skin infection that can spread to the rest of the body, causing sepsis, a serious condition that can affect your organs. We are sending you to the emergency department for immediate treatment with fluids and antibiotics. Blood tests will be done to check for sepsis and to see how your organs are functioning.  -CHRONIC LOW BLOOD PRESSURE: Your blood pressure has been consistently low, which may be worsened by your current illness and dehydration. Once you are stable and your infection is treated, we may consider reducing the dose of your blood pressure medication, Spironolactone.  INSTRUCTIONS:  After you are discharged from the hospital, please schedule a follow-up appointment with [place] to check on your recovery and discuss any changes to your medications.

## 2022-07-26 ENCOUNTER — Inpatient Hospital Stay (HOSPITAL_COMMUNITY): Payer: Medicare Other

## 2022-07-26 DIAGNOSIS — I5022 Chronic systolic (congestive) heart failure: Secondary | ICD-10-CM

## 2022-07-26 LAB — HIV ANTIBODY (ROUTINE TESTING W REFLEX): HIV Screen 4th Generation wRfx: NONREACTIVE

## 2022-07-26 LAB — BASIC METABOLIC PANEL
Anion gap: 18 — ABNORMAL HIGH (ref 5–15)
Anion gap: 12 (ref 5–15)
BUN: 96 mg/dL — ABNORMAL HIGH (ref 8–23)
BUN: 82 mg/dL — ABNORMAL HIGH (ref 8–23)
CO2: 15 mmol/L — ABNORMAL LOW (ref 22–32)
CO2: 16 mmol/L — ABNORMAL LOW (ref 22–32)
Calcium: 7.6 mg/dL — ABNORMAL LOW (ref 8.9–10.3)
Calcium: 7.9 mg/dL — ABNORMAL LOW (ref 8.9–10.3)
Chloride: 103 mmol/L (ref 98–111)
Chloride: 109 mmol/L (ref 98–111)
Creatinine, Ser: 2.97 mg/dL — ABNORMAL HIGH (ref 0.61–1.24)
Creatinine, Ser: 2.51 mg/dL — ABNORMAL HIGH (ref 0.61–1.24)
GFR, Estimated: 22 mL/min — ABNORMAL LOW (ref >60)
GFR, Estimated: 27 mL/min — ABNORMAL LOW (ref >60)
Glucose, Bld: 142 mg/dL — ABNORMAL HIGH (ref 70–99)
Glucose, Bld: 168 mg/dL — ABNORMAL HIGH (ref 70–99)
Potassium: 3 mmol/L — ABNORMAL LOW (ref 3.5–5.1)
Potassium: 3.6 mmol/L (ref 3.5–5.1)
Sodium: 136 mmol/L (ref 135–145)
Sodium: 137 mmol/L (ref 135–145)

## 2022-07-26 LAB — ECHOCARDIOGRAM COMPLETE
AV Mean grad: 3 mmHg
AV Peak grad: 5.3 mmHg
Ao pk vel: 1.15 m/s
Area-P 1/2: 2.93 cm2
Est EF: UNDETERMINED
Height: 74 in
S' Lateral: 4.1 cm
Weight: 4740.77 oz

## 2022-07-26 LAB — CBC
HCT: 38.5 % — ABNORMAL LOW (ref 39.0–52.0)
Hemoglobin: 13.4 g/dL (ref 13.0–17.0)
MCH: 28.8 pg (ref 26.0–34.0)
MCHC: 34.8 g/dL (ref 30.0–36.0)
MCV: 82.8 fL (ref 80.0–100.0)
Platelets: 135 10*3/uL — ABNORMAL LOW (ref 150–400)
RBC: 4.65 MIL/uL (ref 4.22–5.81)
RDW: 14.3 % (ref 11.5–15.5)
WBC: 10.3 10*3/uL (ref 4.0–10.5)
nRBC: 0 % (ref 0.0–0.2)

## 2022-07-26 LAB — PROTIME-INR
INR: 1.2 (ref 0.8–1.2)
Prothrombin Time: 15.3 seconds — ABNORMAL HIGH (ref 11.4–15.2)

## 2022-07-26 LAB — GLUCOSE, CAPILLARY: Glucose-Capillary: 143 mg/dL — ABNORMAL HIGH (ref 70–99)

## 2022-07-26 LAB — MAGNESIUM: Magnesium: 2.6 mg/dL — ABNORMAL HIGH (ref 1.7–2.4)

## 2022-07-26 LAB — MRSA NEXT GEN BY PCR, NASAL: MRSA by PCR Next Gen: NOT DETECTED

## 2022-07-26 LAB — PHOSPHORUS: Phosphorus: 4.7 mg/dL — ABNORMAL HIGH (ref 2.5–4.6)

## 2022-07-26 MED ORDER — MELATONIN 3 MG PO TABS
3.0000 mg | ORAL_TABLET | Freq: Every day | ORAL | Status: DC
  Administered 2022-07-26 – 2022-07-29 (×4): 3 mg via ORAL
  Filled 2022-07-26 (×4): qty 1

## 2022-07-26 MED ORDER — POTASSIUM CHLORIDE 20 MEQ PO PACK
40.0000 meq | PACK | Freq: Once | ORAL | Status: AC
  Administered 2022-07-26: 40 meq via ORAL
  Filled 2022-07-26: qty 2

## 2022-07-26 MED ORDER — PERFLUTREN LIPID MICROSPHERE
1.0000 mL | INTRAVENOUS | Status: AC | PRN
  Administered 2022-07-26: 3 mL via INTRAVENOUS

## 2022-07-26 MED ORDER — POTASSIUM CHLORIDE 10 MEQ/100ML IV SOLN
10.0000 meq | INTRAVENOUS | Status: AC
  Administered 2022-07-26 (×2): 10 meq via INTRAVENOUS
  Filled 2022-07-26 (×2): qty 100

## 2022-07-26 MED ORDER — ATORVASTATIN CALCIUM 40 MG PO TABS
40.0000 mg | ORAL_TABLET | Freq: Every day | ORAL | Status: DC
  Administered 2022-07-26 – 2022-07-29 (×4): 40 mg via ORAL
  Filled 2022-07-26 (×4): qty 1

## 2022-07-26 NOTE — Progress Notes (Signed)
NAME:  Frank Love, MRN:  [id number], DOB:  06-May-1955, LOS: 1 ADMISSION DATE:  07/25/2022, CONSULTATION DATE:  07/25/22 REFERRING MD:  Suezanne Jacquet CHIEF COMPLAINT:  Weakness, HoTN   History of Present Illness:  Frank Love is a 67 y.o. 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 He presented to [facility] ED 7/1 with generalized weakness and feeling poorly x 3 - 4 days. Symptoms started 6/27 or 6/28 and on 6/28, wife felt he was having a stroke because he was speaking incoherently; however, this resolved on it's own. He was working outside in the heat and felt maybe symptoms were related to that. Over the next 2 days, he continued to have generalized weakness along with decreased PO intake. 7/1, he felt worse and noticed a new rash on his LLE along with a few bouts of diarrhea which prompted him to seek care from his PCP. While at PCP, SBP noted to be in 70's. He was subsequently referred to th ED for concern for sepsis in setting of LLE cellulitis. He had LLE duplex study given LLE edema and erythema. Prelim results are negative.   In ED, he received 2L IVF and BP remained soft with SBP in 60 and 70 range. Of note, he has chronic hypotension with SBP in the 100 range. Despite SBP reading 60-70 in the ED, he is completely alert and oriented. He feels a bit better after fluids. His rash extends from L ankle all the way up to the medial groin. It is not oozing. He has occasionally had similar rash which has been attributed to cellulitis. He denies any fevers/chills/sweats, chest pain, dyspnea, N/V, abd pain, myalgias. He has had decreased PO intake since 6/28. Of note, he has continued to take his meds daily since symptom onset (to include Carvedilol, furosemide, Entresto, Spironolactone).  Given ongoing hypotension, he was started on low dose Norepinephrine and PCCM asked to evaluate for ICU admission.  Pertinent  Medical  History:  has Chronic systolic CHF (congestive heart failure) (HCC); Preop cardiovascular exam; Abnormal EKG; Coronary artery disease involving native coronary artery of native heart without angina pectoris; Ischemic cardiomyopathy; S/P CABG x 4; Coronary artery disease; Debility; Postoperative anemia due to acute blood loss; Stenosis of carotid artery; Educated about COVID-19 virus infection; Primary osteoarthritis of right hip; Status post total replacement of right hip; COVID-19; Orthostatic hypotension; Hyperlipidemia; Bradycardia; and Septic shock (HCC) on their problem list.  Significant Hospital Events: Including procedures, antibiotic start and stop dates in addition to other pertinent events   7/1 admit. 7/2, remains on pressors  Interim History / Subjective:  Feels overall better Feels redness in his lower extremity looks a bit worse  Objective:  Blood pressure (!) 109/56, pulse 62, temperature 97.6 F (36.4 C), temperature source Oral, resp. rate (!) 24, height 6\' 2"  (1.88 m), weight 134.4 kg, SpO2 95 %.        Intake/Output Summary (Last 24 hours) at 07/26/2022 0826 Last data filed at 07/26/2022 0700 Gross per 24 hour  Intake 3501.63 ml  Output 1750 ml  Net 1751.63 ml   Filed Weights   07/25/22 1705 07/25/22 2243 07/26/22 0203  Weight: 131.5 kg 134.4 kg 134.4 kg    Examination: General: Elderly, resting comfortably in bed, does not appear to be in distress Neuro: Alert and oriented x 3, no focal deficits HEENT: Moist oral mucosa Cardiovascular: S1-S2 appreciated with no murmur Lungs:  Clear breath sounds bilaterally Abdomen: Bowel sounds appreciated Musculoskeletal: Redness, erythema of the left lower extremity up to below the knee Skin: Bilateral lower extremity swelling with redness erythema on the left  Labs/imaging personally reviewed:  LLE duplex 7/1 > prelim negative.  Assessment & Plan:   .  Septic shock secondary to left lower extremity cellulitis -Has  received about 2 L of fluid -Albumin was given -Empirically on vancomycin/cefepime -Will continue to follow cultures -Repeat echocardiogram ordered  History of chronic combined heart failure -Ejection fraction 35-40% Coronary artery disease Hypertension -Continue home aspirin, atorvastatin -Carvedilol, Farxiga, Lasix, Entresto, spironolactone on hold  Hyponatremia -Monitor -Hypovolemic  AKI -Maintain renal perfusion -Avoid nephrotoxic medications  Will continue to monitor closely  Still needs to be in the ICU with pressor requirement  Will plan on de-escalating antibiotics as pressor requirement improves and cultures get finalized  Best practice (evaluated daily):  Diet/type: Regular consistency (see orders) DVT prophylaxis: prophylactic heparin  GI prophylaxis: N/A Lines: N/A Foley:  N/A Code Status:  full code Last date of multidisciplinary goals of care discussion: None yet.  Labs   CBC: Recent Labs  Lab 07/25/22 1720 07/26/22 0420  WBC 9.7 10.3  NEUTROABS 8.3*  --   HGB 13.8 13.4  HCT 43.5 38.5*  MCV 87.9 82.8  PLT 140* 135*    Basic Metabolic Panel: Recent Labs  Lab 07/25/22 1720 07/26/22 0156  NA 132* 136  K 3.9 3.0*  CL 100 103  CO2 16* 15*  GLUCOSE 139* 142*  BUN 102* 96*  CREATININE 3.27* 2.97*  CALCIUM 8.0* 7.6*  MG  --  2.6*  PHOS  --  4.7*   GFR: Estimated Creatinine Clearance: 35.2 mL/min (A) (by C-G formula based on SCr of 2.97 mg/dL (H)). Recent Labs  Lab 07/25/22 1720 07/25/22 1842 07/26/22 0420  WBC 9.7  --  10.3  LATICACIDVEN 1.9 1.9  --     Liver Function Tests: Recent Labs  Lab 07/25/22 1720  AST 44*  ALT 42  ALKPHOS 76  BILITOT 1.4*  PROT 7.1  ALBUMIN 2.9*   No results for input(s): "LIPASE", "AMYLASE" in the last 168 hours. No results for input(s): "AMMONIA" in the last 168 hours.  ABG    Component Value Date/Time   PHART 7.470 (H) 12/13/2019 2335   PCO2ART 22.4 (L) 12/13/2019 2335   PO2ART 118 (H)  12/13/2019 2335   HCO3 16.3 (L) 12/13/2019 2335   TCO2 17 (L) 12/13/2019 2335   ACIDBASEDEF 6.0 (H) 12/13/2019 2335   O2SAT 99.0 12/13/2019 2335     Coagulation Profile: Recent Labs  Lab 07/26/22 0156  INR 1.2    Cardiac Enzymes: No results for input(s): "CKTOTAL", "CKMB", "CKMBINDEX", "TROPONINI" in the last 168 hours.  HbA1C: HbA1c, POC (prediabetic range)  Date/Time Value Ref Range Status  05/07/2021 09:00 AM 5.8 5.7 - 6.4 % Final   HbA1c, POC (controlled diabetic range)  Date/Time Value Ref Range Status  09/16/2020 03:37 PM 5.8 0.0 - 7.0 % Final   Hgb A1c MFr Bld  Date/Time Value Ref Range Status  03/08/2022 04:35 PM 6.2 (H) 4.8 - 5.6 % Final    Comment:             Prediabetes: 5.7 - 6.4          Diabetes: >6.4          Glycemic control for adults with diabetes: <7.0   12/14/2019 05:46 AM 6.1 (H) 4.8 - 5.6 % Final  Comment:    (NOTE) Pre diabetes:          5.7%-6.4%  Diabetes:              >6.4%  Glycemic control for   <7.0% adults with diabetes     CBG: Recent Labs  Lab 07/25/22 2237 07/26/22 0321  GLUCAP 106* 143*    Review of Systems:   All negative; except for those that are bolded, which indicate positives.  Constitutional: weight loss, weight gain, night sweats, fevers, chills, fatigue, weakness.  HEENT: headaches, sore throat, sneezing, nasal congestion, post nasal drip, difficulty swallowing, tooth/dental problems, visual complaints, visual changes, ear aches. Neuro: difficulty with speech, weakness, numbness, ataxia. CV:  chest pain, orthopnea, PND, swelling in lower extremities, dizziness, palpitations, syncope.  Resp: cough, hemoptysis, dyspnea, wheezing. GI: heartburn, indigestion, abdominal pain, nausea, vomiting, diarrhea, constipation, change in bowel habits, loss of appetite, hematemesis, melena, hematochezia.  GU: dysuria, change in color of urine, urgency or frequency, flank pain, hematuria. MSK: joint pain or swelling,  decreased range of motion. Psych: change in mood or affect, depression, anxiety, suicidal ideations, homicidal ideations. Skin: rash, itching, bruising, LLE edema and erythema.   Past Medical History:  He,  has a past medical history of Cancer (HCC), CHF (congestive heart failure) (HCC), Chronic pain of right knee, Coronary artery disease, Hypertension, and Phimosis.   Surgical History:   Past Surgical History:  Procedure Laterality Date   CIRCUMCISION N/A 10/12/2018   Procedure: CIRCUMCISION ADULT;  Surgeon: Marcine Matar, MD;  Location: [facility];  Service: Urology;  Laterality: N/A;  45 mins   CO2 LASER APPLICATION N/A 04/29/2019   Procedure: CO2 LASER APPLICATION;  Surgeon: Marcine Matar, MD;  Location: [facility];  Service: Urology;  Laterality: N/A;  30 MINS   COLONOSCOPY     CORONARY ARTERY BYPASS GRAFT N/A 12/12/2018   Procedure: CORONARY ARTERY BYPASS GRAFTING (CABG) x four, using bilateral internal mammary arteries and right leg greater saphenous vein harvested endoscopically;  Surgeon: Linden Dolin, MD;  Location: [facility];  Service: Open Heart Surgery;  Laterality: N/A;   LEFT HEART CATH AND CORONARY ANGIOGRAPHY N/A 11/28/2018   Procedure: LEFT HEART CATH AND CORONARY ANGIOGRAPHY;  Surgeon: Kathleene Hazel, MD;  [facility];  Service: Cardiovascular;  Laterality: N/A;   MEDIASTINAL EXPLORATION N/A 12/12/2018   Procedure: Mediastinal Re-Exploration after bypass graft;  Surgeon: Linden Dolin, MD;  Location: [facility];  Service: Open Heart Surgery;  Laterality: N/A;   TEE WITHOUT CARDIOVERSION N/A 12/12/2018   Procedure: TRANSESOPHAGEAL ECHOCARDIOGRAM (TEE);  Surgeon: Linden Dolin, MD;  Location: [facility];  Service: Open Heart Surgery;  Laterality: N/A;   TOOTH EXTRACTION     TOTAL HIP ARTHROPLASTY Right 03/08/2019   Procedure: RIGHT TOTAL HIP ARTHROPLASTY ANTERIOR APPROACH;  Surgeon: Kathryne Hitch, MD;  Location: [facility];  Service:  Orthopedics;  Laterality: Right;     Social History:   reports that he has never smoked. He has never used smokeless tobacco. He reports that he does not drink alcohol and does not use drugs.   Family History:  His family history includes Diabetes in his mother; Heart disease in his mother.   Allergies No Known Allergies   The patient is critically ill with multiple organ systems failure and requires high complexity decision making for assessment and support, frequent evaluation and titration of therapies, application of advanced monitoring technologies and extensive interpretation of multiple databases. Critical Care Time devoted to patient  care services described in this note independent of APP/resident time (if applicable)  is 32 minutes.   Virl Diamond MD [facility] Personal pager: See Amion If unanswered, please page CCM On-call: #[phone]

## 2022-07-26 NOTE — Progress Notes (Addendum)
eLink Physician-Brief Progress Note Patient Name: Frank Love DOB: 1955-06-04 MRN: [id number]   Date of Service  07/26/2022  HPI/Events of Note  Notified of K 3.0 on AM labs.  Creatinine 2.97  eICU Interventions  Placed order for 20 meq Kcl IV as well as Kcl PO.  Would be cautious replacing K given underlying renal dysfunction.  Continue to monitor serial BMP        Danish Ruffins M DELA CRUZ 07/26/2022, 2:56 AM  4:23 AM Pt anxious, has been unable to sleep.  Placed order for melatonin. Will follow response.

## 2022-07-27 LAB — COMPREHENSIVE METABOLIC PANEL
ALT: 30 U/L (ref 0–44)
AST: 29 U/L (ref 15–41)
Albumin: 2.3 g/dL — ABNORMAL LOW (ref 3.5–5.0)
Alkaline Phosphatase: 77 U/L (ref 38–126)
Anion gap: 13 (ref 5–15)
BUN: 53 mg/dL — ABNORMAL HIGH (ref 8–23)
CO2: 20 mmol/L — ABNORMAL LOW (ref 22–32)
Calcium: 8.2 mg/dL — ABNORMAL LOW (ref 8.9–10.3)
Chloride: 107 mmol/L (ref 98–111)
Creatinine, Ser: 1.55 mg/dL — ABNORMAL HIGH (ref 0.61–1.24)
GFR, Estimated: 49 mL/min — ABNORMAL LOW (ref >60)
Glucose, Bld: 112 mg/dL — ABNORMAL HIGH (ref 70–99)
Potassium: 3.2 mmol/L — ABNORMAL LOW (ref 3.5–5.1)
Sodium: 140 mmol/L (ref 135–145)
Total Bilirubin: 0.9 mg/dL (ref 0.3–1.2)
Total Protein: 6.3 g/dL — ABNORMAL LOW (ref 6.5–8.1)

## 2022-07-27 LAB — CBC
HCT: 38.6 % — ABNORMAL LOW (ref 39.0–52.0)
Hemoglobin: 12.6 g/dL — ABNORMAL LOW (ref 13.0–17.0)
MCH: 28.1 pg (ref 26.0–34.0)
MCHC: 32.6 g/dL (ref 30.0–36.0)
MCV: 86 fL (ref 80.0–100.0)
Platelets: 146 10*3/uL — ABNORMAL LOW (ref 150–400)
RBC: 4.49 MIL/uL (ref 4.22–5.81)
RDW: 14.6 % (ref 11.5–15.5)
WBC: 6.4 10*3/uL (ref 4.0–10.5)
nRBC: 0 % (ref 0.0–0.2)

## 2022-07-27 LAB — VANCOMYCIN, RANDOM: Vancomycin Rm: 8 ug/mL

## 2022-07-27 MED ORDER — LACTATED RINGERS IV BOLUS
500.0000 mL | Freq: Once | INTRAVENOUS | Status: AC
  Administered 2022-07-27: 500 mL via INTRAVENOUS

## 2022-07-27 MED ORDER — FUROSEMIDE 40 MG PO TABS
40.0000 mg | ORAL_TABLET | Freq: Every day | ORAL | Status: DC
  Administered 2022-07-28: 40 mg via ORAL
  Filled 2022-07-27: qty 1

## 2022-07-27 MED ORDER — CEPHALEXIN 500 MG PO CAPS
500.0000 mg | ORAL_CAPSULE | Freq: Four times a day (QID) | ORAL | Status: DC
  Administered 2022-07-27 – 2022-07-30 (×12): 500 mg via ORAL
  Filled 2022-07-27 (×12): qty 1

## 2022-07-27 MED ORDER — ACETAMINOPHEN 325 MG PO TABS
650.0000 mg | ORAL_TABLET | ORAL | Status: DC | PRN
  Administered 2022-07-27 – 2022-07-30 (×6): 650 mg via ORAL
  Filled 2022-07-27 (×6): qty 2

## 2022-07-27 MED ORDER — POTASSIUM CHLORIDE CRYS ER 20 MEQ PO TBCR
40.0000 meq | EXTENDED_RELEASE_TABLET | Freq: Once | ORAL | Status: AC
  Administered 2022-07-27: 40 meq via ORAL
  Filled 2022-07-27: qty 2

## 2022-07-27 MED ORDER — VANCOMYCIN HCL 1500 MG/300ML IV SOLN
1500.0000 mg | Freq: Once | INTRAVENOUS | Status: AC
  Administered 2022-07-27: 1500 mg via INTRAVENOUS
  Filled 2022-07-27: qty 300

## 2022-07-27 NOTE — Consult Note (Addendum)
Advanced Heart Failure Team Consult Note   Primary Physician: Claiborne Rigg, NP PCP-Cardiologist:  Rollene Rotunda, MD  Reason for Consultation: Acute on chronic biventricular heart failure  HPI:    Frank Love is seen today for evaluation of acute on chronic biventricular heart failure at the request of Dr. Wynona Neat. 67 y.o. male with history of CAD s/p CABG, HFrEF, HTN, HLD, carotid artery stenosis.   Echo in 9/20 showed EF 30-35%.   He underwent cath 11/20 which showed multivessel disease, had CABGx4 12/12/18.  Repeat echo done post-CABG 12/17/18 showed severely decreased right ventricular systolic function with severe enlargement in addition to small pericardial effusion, otherwise similar to previous. VQ scan was negative for PE  Echo 03/2019, EF 35-40%, mild LVH, mild LV dilation, moderately decreased RV systolic function, normal RV size.    Admitted to [facility] 11/19-12/10/21 for COVID-19 viral pneumonia requiring up to 30 L of HFNC.  Received remdesivir x 5 days, steroid taper, tocilizumab/baricitinib. Hospitalization complicated by development of severe orthostatic hypotension and thrombocytopenia. Echo 12/14/19 with EF 45-50%.    Echo 3/22: EF 35-40%, moderately decreased RV systolic function with normal RV size.  Technically difficult study.  cMRI (9/22) showed LVEF 33%, RVEF 30%, subendocardial LGE suggestive of coronary disease.  He has not been interested in getting ICD.  Presented to ED 07/25/22 with worsening weakness, inappropriate speech, decreased po intake, and increasing redness/rash and rash to left lower extremity X several days. He was hypotensive SBP 60s-70s, Given IVF and started on pressor support. Labs significant for Cr 3.27, BUN 102, CO2 16, Na 132, K 3.9. Lactic acid 1.9. Admitted to ICU for suspected spetic shock 2/2 LLE cellulitis. Has been started on empiric abx with vancomycin and cefepime.   Echo today: LV not well seen, EF appeared at least  moderately reduced, RV appeared severely dilated with severe HK  Has weaned off NE but blood pressure has remained soft.   Advanced Heart Failure asked to see d/t concern for worsening biventricular dysfunction on echo.     Home Medications Prior to Admission medications   Medication Sig Start Date End Date Taking? Authorizing Provider  aspirin EC 81 MG tablet Take 1 tablet (81 mg total) by mouth daily. Swallow whole. 06/05/20   Laurey Morale, MD  atorvastatin (LIPITOR) 40 MG tablet Take 1 tablet (40 mg total) by mouth daily at 6 PM. 08/09/21   Milford, Anderson Malta, FNP  carvedilol (COREG) 3.125 MG tablet Take 1 tablet (3.125 mg total) by mouth 2 (two) times daily with a meal. 04/20/22   Milford, Anderson Malta, FNP  dapagliflozin propanediol (FARXIGA) 10 MG TABS tablet TAKE 1 TABLET (10 MG TOTAL) BY MOUTH DAILY BEFORE BREAKFAST. 06/21/22 06/21/23  Laurey Morale, MD  furosemide (LASIX) 40 MG tablet Take 1 tablet (40 mg total) by mouth daily. 01/27/22   Laurey Morale, MD  nitroGLYCERIN (NITROSTAT) 0.4 MG SL tablet Place 1 tablet (0.4 mg total) under the tongue every 5 (five) minutes as needed for chest pain. NO more than 3 pills--if you have the need to take 2 pills or more call MD 05/06/20   Laurey Morale, MD  potassium chloride SA (KLOR-CON M) 20 MEQ tablet Take 1 tablet (20 mEq total) by mouth as directed. Take 1 tablet only take when taking a extra lasix dose 04/20/22   Jacklynn Ganong, FNP  promethazine-dextromethorphan (PROMETHAZINE-DM) 6.25-15 MG/5ML syrup Take 5 mLs by mouth 4 (four) times daily as needed for  cough. 03/17/22   Claiborne Rigg, NP  sacubitril-valsartan (ENTRESTO) 97-103 MG Take 1 tablet by mouth 2 (two) times daily. 04/23/21   Laurey Morale, MD  spironolactone (ALDACTONE) 25 MG tablet Take 1 tablet (25 mg total) by mouth daily. 02/25/22   Laurey Morale, MD  Vitamin D, Ergocalciferol, (DRISDOL) 1.25 MG (50000 UNIT) CAPS capsule Take 1 capsule (50,000 Units total) by mouth  every 7 (seven) days. 06/19/22   Claiborne Rigg, NP    Past Medical History: Past Medical History:  Diagnosis Date   Cancer [facility])    penile cancer   CHF (congestive heart failure) (HCC)    Chronic pain of right knee    Coronary artery disease    Hypertension    Phimosis     Past Surgical History: Past Surgical History:  Procedure Laterality Date   CIRCUMCISION N/A 10/12/2018   Procedure: CIRCUMCISION ADULT;  Surgeon: Marcine Matar, MD;  Location: [facility] ORS;  Service: Urology;  Laterality: N/A;  45 mins   CO2 LASER APPLICATION N/A 04/29/2019   Procedure: CO2 LASER APPLICATION;  Surgeon: Marcine Matar, MD;  Location: [facility] ORS;  Service: Urology;  Laterality: N/A;  30 MINS   COLONOSCOPY     CORONARY ARTERY BYPASS GRAFT N/A 12/12/2018   Procedure: CORONARY ARTERY BYPASS GRAFTING (CABG) x four, using bilateral internal mammary arteries and right leg greater saphenous vein harvested endoscopically;  Surgeon: Linden Dolin, MD;  Location: [facility] OR;  Service: Open Heart Surgery;  Laterality: N/A;   LEFT HEART CATH AND CORONARY ANGIOGRAPHY N/A 11/28/2018   Procedure: LEFT HEART CATH AND CORONARY ANGIOGRAPHY;  Surgeon: Kathleene Hazel, MD;  Location: [facility] INVASIVE CV LAB;  Service: Cardiovascular;  Laterality: N/A;   MEDIASTINAL EXPLORATION N/A 12/12/2018   Procedure: Mediastinal Re-Exploration after bypass graft;  Surgeon: Linden Dolin, MD;  Location: [facility] OR;  Service: Open Heart Surgery;  Laterality: N/A;   TEE WITHOUT CARDIOVERSION N/A 12/12/2018   Procedure: TRANSESOPHAGEAL ECHOCARDIOGRAM (TEE);  Surgeon: Linden Dolin, MD;  Location: [facility];  Service: Open Heart Surgery;  Laterality: N/A;   TOOTH EXTRACTION     TOTAL HIP ARTHROPLASTY Right 03/08/2019   Procedure: RIGHT TOTAL HIP ARTHROPLASTY ANTERIOR APPROACH;  Surgeon: Kathryne Hitch, MD;  Location: [facility] ORS;  Service: Orthopedics;  Laterality: Right;    Family History: Family History  Problem Relation Age of  Onset   Diabetes Mother    Heart disease Mother     Social History: Social History   Socioeconomic History   Marital status: Married    Spouse name: Not on file   Number of children: Not on file   Years of education: Not on file   Highest education level: Not on file  Occupational History   Not on file  Tobacco Use   Smoking status: Never   Smokeless tobacco: Never  Vaping Use   Vaping Use: Never used  Substance and Sexual Activity   Alcohol use: No    Alcohol/week: 0.0 standard drinks of alcohol   Drug use: No   Sexual activity: Yes  Other Topics Concern   Not on file  Social History Narrative   Lives with wife.     Social Determinants of Health   Financial Resource Strain: High Risk (12/28/2021)   Overall Financial Resource Strain (CARDIA)    Difficulty of Paying Living Expenses: Hard  Food Insecurity: Not on file  Transportation Needs: Not on file  Physical Activity: Not on file  Stress: Not  on file  Social Connections: Not on file    Allergies:  No Known Allergies  Objective:    Vital Signs:   Temp:  [97.5 F (36.4 C)-98.4 F (36.9 C)] 97.5 F (36.4 C) (07/03 1114) Pulse Rate:  [35-87] 57 (07/03 1145) Resp:  [13-29] 18 (07/03 1145) BP: (77-113)/(39-85) 101/57 (07/03 1145) SpO2:  [79 %-99 %] 93 % (07/03 1145) Weight:  [126.9 kg] 126.9 kg (07/03 0500) Last BM Date : 07/27/22  Weight change: Filed Weights   07/25/22 2243 07/26/22 0203 07/27/22 0500  Weight: 134.4 kg 134.4 kg 126.9 kg    Intake/Output:   Intake/Output Summary (Last 24 hours) at 07/27/2022 1152 Last data filed at 07/27/2022 1000 Gross per 24 hour  Intake 1533.51 ml  Output 1770 ml  Net -236.49 ml      Physical Exam    General:  Well appearing. Sitting up in bed. HEENT: normal Neck: supple. JVP difficult d/t thick neck but does not appear significantly elevated. Carotids 2+ bilat; no bruits.  Cor: PMI nondisplaced. Regular rate & rhythm. No rubs, gallops or murmurs. Lungs:  clear Abdomen: obese, soft, nontender, nondistended.  Extremities: no cyanosis, clubbing, rash, trace edema on right, tense RLE swelling with redness extending up to thigh Neuro: alert & orientedx3. Affect pleasant   Telemetry   SR/sinus brady 50s-60s  EKG    SR 70 bpm, PVCs  Labs   Basic Metabolic Panel: Recent Labs  Lab 07/25/22 1720 07/26/22 0156 07/26/22 0937  NA 132* 136 137  K 3.9 3.0* 3.6  CL 100 103 109  CO2 16* 15* 16*  GLUCOSE 139* 142* 168*  BUN 102* 96* 82*  CREATININE 3.27* 2.97* 2.51*  CALCIUM 8.0* 7.6* 7.9*  MG  --  2.6*  --   PHOS  --  4.7*  --     Liver Function Tests: Recent Labs  Lab 07/25/22 1720  AST 44*  ALT 42  ALKPHOS 76  BILITOT 1.4*  PROT 7.1  ALBUMIN 2.9*   No results for input(s): "LIPASE", "AMYLASE" in the last 168 hours. No results for input(s): "AMMONIA" in the last 168 hours.  CBC: Recent Labs  Lab 07/25/22 1720 07/26/22 0420  WBC 9.7 10.3  NEUTROABS 8.3*  --   HGB 13.8 13.4  HCT 43.5 38.5*  MCV 87.9 82.8  PLT 140* 135*    Cardiac Enzymes: No results for input(s): "CKTOTAL", "CKMB", "CKMBINDEX", "TROPONINI" in the last 168 hours.  BNP: BNP (last 3 results) Recent Labs    09/30/21 0911 03/17/22 1227 04/20/22 1141  BNP 243.5* 186.2* 193.3*    ProBNP (last 3 results) No results for input(s): "PROBNP" in the last 8760 hours.   CBG: Recent Labs  Lab 07/25/22 2237 07/26/22 0321  GLUCAP 106* 143*    Coagulation Studies: Recent Labs    07/26/22 0156  LABPROT 15.3*  INR 1.2     Imaging   ECHOCARDIOGRAM COMPLETE  Result Date: 07/26/2022    ECHOCARDIOGRAM REPORT   Patient Name:   LAXMAN MARKOVIC Date of Exam: 07/26/2022 Medical Rec #:  [id number]          Height:       74.0 in Accession #:    [id number]         Weight:       296.3 lb Date of Birth:  [birth year]          BSA:          2.569 m Patient Age:  67 years           BP:           81/41 mmHg Patient Gender: M                  HR:            61 bpm. Exam Location:  Inpatient Procedure: 2D Echo, Color Doppler, Cardiac Doppler and Intracardiac            Opacification Agent Indications:    CHF  History:        Patient has prior history of Echocardiogram examinations, most                 recent 04/03/2020. CHF and Cardiomyopathy, CAD, Prior CABG; Risk                 Factors:Dyslipidemia.  Sonographer:    Milbert Coulter Referring Phys: [id number] Tomma Lightning  Sonographer Comments: Suboptimal parasternal window, suboptimal apical window, no subcostal window and patient is obese. Image acquisition challenging due to patient body habitus. IMPRESSIONS  1. LV not well seen abnormal septal motion ? moderate reduction in EF. Left ventricular ejection fraction, by estimation, is unable to estimate%. The left ventricle has normal function. Left ventricular endocardial border not optimally defined to evaluate regional wall motion. Left ventricular diastolic parameters are indeterminate.  2. Not well seen but appears severely dilated with severe reduction in fucntion . Right ventricular systolic function was not well visualized. The right ventricular size is normal.  3. The mitral valve is abnormal. No evidence of mitral valve regurgitation. No evidence of mitral stenosis. Moderate mitral annular calcification.  4. The aortic valve is tricuspid. There is mild calcification of the aortic valve. Aortic valve regurgitation is not visualized. Aortic valve sclerosis is present, with no evidence of aortic valve stenosis. FINDINGS  Left Ventricle: LV not well seen abnormal septal motion ? moderate reduction in EF. Left ventricular ejection fraction, by estimation, is unable to estimate%. The left ventricle has normal function. Left ventricular endocardial border not optimally defined to evaluate regional wall motion. Definity contrast agent was given IV to delineate the left ventricular endocardial borders. The left ventricular internal cavity size was normal in size.  There is no left ventricular hypertrophy. Left ventricular  diastolic parameters are indeterminate. Right Ventricle: Not well seen but appears severely dilated with severe reduction in fucntion. The right ventricular size is normal. No increase in right ventricular wall thickness. Right ventricular systolic function was not well visualized. Left Atrium: Left atrial size was not well visualized. Right Atrium: Right atrial size was not well visualized. Pericardium: There is no evidence of pericardial effusion. Mitral Valve: The mitral valve is abnormal. There is moderate thickening of the mitral valve leaflet(s). There is moderate calcification of the mitral valve leaflet(s). Moderate mitral annular calcification. No evidence of mitral valve regurgitation. No evidence of mitral valve stenosis. Tricuspid Valve: The tricuspid valve is normal in structure. Tricuspid valve regurgitation is mild . No evidence of tricuspid stenosis. Aortic Valve: The aortic valve is tricuspid. There is mild calcification of the aortic valve. Aortic valve regurgitation is not visualized. Aortic valve sclerosis is present, with no evidence of aortic valve stenosis. Aortic valve mean gradient measures 3.0 mmHg. Aortic valve peak gradient measures 5.3 mmHg. Pulmonic Valve: The pulmonic valve was normal in structure. Pulmonic valve regurgitation is mild. No evidence of pulmonic stenosis. Aorta: The aortic root is normal in size and structure. Venous: The  inferior vena cava was not well visualized. IAS/Shunts: The interatrial septum was not assessed.  LEFT VENTRICLE PLAX 2D LVIDd:         5.80 cm Diastology LVIDs:         4.10 cm LV e' medial:    7.62 cm/s LV PW:         1.00 cm LV E/e' medial:  7.5 LV IVS:        1.00 cm LV e' lateral:   12.10 cm/s                        LV E/e' lateral: 4.7  RIGHT VENTRICLE RV S prime:     10.00 cm/s TAPSE (M-mode): 1.2 cm LEFT ATRIUM         Index LA diam:    4.80 cm 1.87 cm/m  AORTIC VALVE AV Vmax:            115.00 cm/s AV Vmean:          82.900 cm/s AV VTI:            0.247 m AV Peak Grad:      5.3 mmHg AV Mean Grad:      3.0 mmHg LVOT Vmax:         69.80 cm/s LVOT Vmean:        51.900 cm/s LVOT VTI:          0.158 m LVOT/AV VTI ratio: 0.64  AORTA Ao Root diam: 3.20 cm Ao Asc diam:  3.30 cm MITRAL VALVE               TRICUSPID VALVE MV Area (PHT): 2.93 cm    TR Peak grad:   23.6 mmHg MV Decel Time: 259 msec    TR Vmax:        243.00 cm/s MV E velocity: 57.40 cm/s MV A velocity: 57.40 cm/s  SHUNTS MV E/A ratio:  1.00        Systemic VTI: 0.16 m Charlton Haws MD Electronically signed by Charlton Haws MD Signature Date/Time: 07/26/2022/3:54:15 PM    Final      Medications:     Current Medications:  aspirin  81 mg Oral Daily   atorvastatin  40 mg Oral q1800   cephALEXin  500 mg Oral Q6H   Chlorhexidine Gluconate Cloth  6 each Topical Q0600   heparin  5,000 Units Subcutaneous Q8H   melatonin  3 mg Oral QHS    Infusions:  sodium chloride 10 mL/hr at 07/27/22 1000   norepinephrine (LEVOPHED) Adult infusion Stopped (07/27/22 1610)      Patient Profile   67 y.o. male with history of HFrEF/iCM and CAD s/p CABG.  Admitted with septic shock  and AKI d/t left lower extremity cellulitis. BP has remained soft after discontinuation of pressor support. Echo with concern for worsening biventricular dysfunction. Advanced Heart Failure asked to see to assist with management of CHF.  Assessment/Plan   Shock: Felt to be septic from left lower extremity cellulitis. -Treated with IV antibiotics -Received IVF on admit. NE now off. BP soft earlier today, some improvement with 500 cc bolus LR this am -BC NGTD -Check CMET and CBC today. -Lactic acid 1.9 X 2. She's had metabolic acidosis with CO2 of 15. -? Component of low-output HF contributing to hypotension and AKI.  -Echo difficult but EF appears at least moderately down and RV severely reduced -May benefit from RHC to assess hemodynamics. 2. Acute on chronic  biventricular heart failure: Ischemic cardiomyopathy.  Pre-CABG echo with EF 30-35%, mild RV dysfunction.  Post-CABG echo (11/20) with EF 30-35%, severe RV systolic dysfunction.  V/Q scan done, no evidence for PE. Hypotensive post-CABG requiring pressors and then midodrine, but improved over time.  Echo 3/21: EF 35-40% with moderate RV dysfunction, GIIDD. Cardiac MRI was recommended to quantify EF more exactly for purposes of ICD determination, however pt was adamant that he did not want an ICD and declined cMRI. Echo repeated 11/21 LVEF improved to 45-50%, RV mildly reduced. Echo in 3/22 was a technically difficult study, LV EF around 35-40% with moderate RV dysfunction. Cardiac MRI in 9/22 showed showed LVEF 33%, RVEF 30%, subendocardial LGE suggestive of coronary disease.  Echo this admit with EF at least moderately reduced and severe RV HK. -NYHA early III at baseline -Does not appear significantly overloaded on exam despite several liters of fluid resuscitation for septic shock. -Not currently on diuretics or GDMT d/t AKI/shock -Tolerated 4 drug regimen of GDMT prior to admission -As above, ? If he would benefit from RHC 3. AKI: Baseline Scr 1.2-1.5, up to 3.27 on admit in setting of shock -Cr improved to 2.5 on yesterday's labs. Check CMET today. 4. CAD: S/p CABG in 2020. No chest pain.  -Continue aspirin and statin  Length of Stay: 2  FINCH, LINDSAY N, PA-C  07/27/2022, 11:52 AM  Advanced Heart Failure Team Pager [phone] (M-F; 7a - 5p)  Please contact [facility] for night-coverage after hours (4p -7a ) and weekends on [email or web address]   Patient seen with PA, agree with the above note.   Patient presented with severe LLE cellulitis and septic shock/AKI.  He is now off norepinephrine and on po Keflex.  He says that the cellulitis has improved though still with prominent LLE erythema.  Afebrile, normal WBCs.  Initial creatinine > 3, now down to 1.55.  No complaints today.  Still off all GDMT.    General: NAD Neck: JVP 8-9 cm, no thyromegaly or thyroid nodule.  Lungs: Clear to auscultation bilaterally with normal respiratory effort. CV: Nondisplaced PMI.  Heart regular S1/S2, no S3/S4, no murmur.  1+ edema to knee on left.  No carotid bruit.  Abdomen: Soft, nontender, no hepatosplenomegaly, no distention.  Skin: Extensive left lower leg erythema.   Neurologic: Alert and oriented x 3.  Psych: Normal affect. Extremities: No clubbing or cyanosis.  HEENT: Normal.   Suspect septic shock with AKI in setting of severe LLE cellulitis.  Initial broad spectrum abx, now improved and off NE.  Antibiotics narrowed to Keflex.   I reviewed echo, EF looks 30-35% range to me with moderate RV dilation/moderate dysfunction.  This may not be significantly different from 9/22 cMRI.  Ischemic cardiomyopathy.  He has refused ICD in the past. Off GDMT due to septic shock with hypotension and AKI.   - If creatinine continues to trend down, restart po Lasix tomorrow.  - Will follow BP and creatinine trend to decide on restarting his other cardiac meds.   No chest pain, no evidence for ACS.   Marca Ancona 07/27/2022 3:17 PM

## 2022-07-27 NOTE — Progress Notes (Signed)
Pharmacy Antibiotic Note  Frank Love is a 67 y.o. male admitted on 07/25/2022 with cellulitis.  Pharmacy has been consulted for Vancomycin/Cefepime dosing.  7/3 AM update:  Vancomycin random level is 8, will re-dose Scr improving some 3.27>>2.97>>2.51 over last few days  Plan: Vancomycin 1500 mg IV x 1 Re-check vancomycin level 7/4 AM  Height: 6\' 2"  (188 cm) Weight: 134.4 kg (296 lb 4.8 oz) IBW/kg (Calculated) : 82.2  Temp (24hrs), Avg:98.1 F (36.7 C), Min:97.6 F (36.4 C), Max:98.4 F (36.9 C)  Recent Labs  Lab 07/25/22 1720 07/25/22 1842 07/26/22 0156 07/26/22 0420 07/26/22 0937 07/27/22 0123  WBC 9.7  --   --  10.3  --   --   CREATININE 3.27*  --  2.97*  --  2.51*  --   LATICACIDVEN 1.9 1.9  --   --   --   --   VANCORANDOM  --   --   --   --   --  8    Estimated Creatinine Clearance: 41.6 mL/min (A) (by C-G formula based on SCr of 2.51 mg/dL (H)).    No Known Allergies  Abran Duke, PharmD, BCPS Clinical Pharmacist Phone: [phone]

## 2022-07-27 NOTE — Progress Notes (Signed)
NAME:  Frank Love, MRN:  [id number], DOB:  1955-10-30, LOS: 2 ADMISSION DATE:  07/25/2022, CONSULTATION DATE:  07/25/22 REFERRING MD:  Suezanne Jacquet CHIEF COMPLAINT:  Weakness, HoTN   History of Present Illness:  Frank Love is a 67 y.o. 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 He presented to [facility] ED 7/1 with generalized weakness and feeling poorly x 3 - 4 days. Symptoms started 6/27 or 6/28 and on 6/28, wife felt he was having a stroke because he was speaking incoherently; however, this resolved on it's own. He was working outside in the heat and felt maybe symptoms were related to that.  Over the next 2 days, he continued to have generalized weakness along with decreased PO intake. 7/1, he felt worse and noticed a new rash on his LLE along with a few bouts of diarrhea which prompted him to seek care from his PCP. While at PCP, SBP noted to be in 70's. He was subsequently referred to th ED for concern for sepsis in setting of LLE cellulitis. He had LLE duplex study given LLE edema and erythema. Prelim results are negative.   In ED, he received 2L IVF and BP remained soft with SBP in 60 and 70 range. Of note, he has chronic hypotension with SBP in the 100 range. Despite SBP reading 60-70 in the ED, he is completely alert and oriented. He feels a bit better after fluids. His rash extends from L ankle all the way up to the medial groin. It is not oozing. He has occasionally had similar rash which has been attributed to cellulitis. He denies any fevers/chills/sweats, chest pain, dyspnea, N/V, abd pain, myalgias. He has had decreased PO intake since 6/28. Of note, he has continued to take his meds daily since symptom onset (to include Carvedilol, furosemide, Entresto, Spironolactone).  Given ongoing hypotension, he was started on low dose Norepinephrine and PCCM asked to evaluate for ICU admission.  Pertinent  Medical  History:  has Chronic systolic CHF (congestive heart failure) (HCC); Preop cardiovascular exam; Abnormal EKG; Coronary artery disease involving native coronary artery of native heart without angina pectoris; Ischemic cardiomyopathy; S/P CABG x 4; Coronary artery disease; Debility; Postoperative anemia due to acute blood loss; Stenosis of carotid artery; Educated about COVID-19 virus infection; Primary osteoarthritis of right hip; Status post total replacement of right hip; COVID-19; Orthostatic hypotension; Hyperlipidemia; Bradycardia; and Septic shock (HCC) on their problem list.  Significant Hospital Events: Including procedures, antibiotic start and stop dates in addition to other pertinent events   7/1: admit  Chest Xray: Left basilar opacities which could represent atelectasis, aspiration, and/or pneumonia. Possible small left pleural effusion. Dedicated PA and lateral radiographs could better assess if clinically warranted.  US Renal: Cortical thinning bilaterally. No obstructive changes are noted.   Started Vancomycin and cefepime   7/2: remains on pressors  Repeat Chest Xray: No active disease.   7/3:  Weaned off pressors. Stopped vancomycin and cefepime. Started Keflex.   Interim History / Subjective:  Patient complained of right lower extremity pain that was relieved with PRN tylenol. He endorses reduced redness and swelling of his left leg.  Patient also reports his diarrhea has subsided.  Denies any SOB, chest pain, cough, lightheadedness, or vision changes.   Objective:  Blood pressure (!) 104/57, pulse 60, temperature (!) 97.5 F (36.4 C), temperature source Oral, resp. rate 14, height 6\' 2"  (1.88 m),  weight 126.9 kg, SpO2 97 %.        Intake/Output Summary (Last 24 hours) at 07/27/2022 1335 Last data filed at 07/27/2022 1200 Gross per 24 hour  Intake 1836.63 ml  Output 1770 ml  Net 66.63 ml   Filed Weights   07/25/22 2243 07/26/22 0203 07/27/22 0500  Weight: 134.4 kg  134.4 kg 126.9 kg    Examination: General: Elderly, sitting in chair at bedside, in no acute distress Neuro: Alert and oriented x 3, no focal deficits HEENT: Moist oral mucosa Cardiovascular: S1-S2 appreciated with no murmur, rubs, or gallops Lungs: Clear breath sounds bilaterally, NWOB Abdomen: Normoactive bowel sounds, obese, soft, NTND  Musculoskeletal: Redness, erythema of the left lower extremity up to below the knee Skin: Bilateral lower extremity swelling with erythema on the left  Labs/imaging personally reviewed:  LLE duplex 7/1 > prelim negative.  Assessment & Plan:   Septic shock secondary to left lower extremity cellulitis -Has received about 2 L of fluid -Albumin was given -Stopped vancomycin/cefepime (7/1 - 7/3) -Start Keflex 500 mg oral Q6Hrs (7/3 - ) -Will continue to follow cultures -Repeat echocardiogram ordered  History of chronic combined heart failure Coronary artery disease with prior ejection fraction of 35-40% (2022). Long standing hypertension. -Continue home aspirin, atorvastatin -Carvedilol, Farxiga, Lasix, Entresto, spironolactone on hold -Cardiology consult   AKI (improving) sCr on admission 3.27 --> 2.51 -Maintain renal perfusion -Avoid nephrotoxic medications -Continue to monitor   Hyponatremia (resolved) On admission Na 132 --> 137  Still needs to be in the ICU with pressor requirement   Best practice (evaluated daily):  Diet/type: Regular consistency (see orders) DVT prophylaxis: prophylactic heparin  GI prophylaxis: N/A Lines: N/A Foley:  N/A Code Status:  full code Last date of multidisciplinary goals of care discussion: None yet.  Labs   CBC: Recent Labs  Lab 07/25/22 1720 07/26/22 0420  WBC 9.7 10.3  NEUTROABS 8.3*  --   HGB 13.8 13.4  HCT 43.5 38.5*  MCV 87.9 82.8  PLT 140* 135*    Basic Metabolic Panel: Recent Labs  Lab 07/25/22 1720 07/26/22 0156 07/26/22 0937  NA 132* 136 137  K 3.9 3.0* 3.6  CL 100  103 109  CO2 16* 15* 16*  GLUCOSE 139* 142* 168*  BUN 102* 96* 82*  CREATININE 3.27* 2.97* 2.51*  CALCIUM 8.0* 7.6* 7.9*  MG  --  2.6*  --   PHOS  --  4.7*  --    GFR: Estimated Creatinine Clearance: 40.4 mL/min (A) (by C-G formula based on SCr of 2.51 mg/dL (H)). Recent Labs  Lab 07/25/22 1720 07/25/22 1842 07/26/22 0420  WBC 9.7  --  10.3  LATICACIDVEN 1.9 1.9  --     Liver Function Tests: Recent Labs  Lab 07/25/22 1720  AST 44*  ALT 42  ALKPHOS 76  BILITOT 1.4*  PROT 7.1  ALBUMIN 2.9*   No results for input(s): "LIPASE", "AMYLASE" in the last 168 hours. No results for input(s): "AMMONIA" in the last 168 hours.  ABG    Component Value Date/Time   PHART 7.470 (H) 12/13/2019 2335   PCO2ART 22.4 (L) 12/13/2019 2335   PO2ART 118 (H) 12/13/2019 2335   HCO3 16.3 (L) 12/13/2019 2335   TCO2 17 (L) 12/13/2019 2335   ACIDBASEDEF 6.0 (H) 12/13/2019 2335   O2SAT 99.0 12/13/2019 2335     Coagulation Profile: Recent Labs  Lab 07/26/22 0156  INR 1.2    Cardiac Enzymes: No results for input(s): "CKTOTAL", "CKMB", "  CKMBINDEX", "TROPONINI" in the last 168 hours.  HbA1C: HbA1c, POC (prediabetic range)  Date/Time Value Ref Range Status  05/07/2021 09:00 AM 5.8 5.7 - 6.4 % Final   HbA1c, POC (controlled diabetic range)  Date/Time Value Ref Range Status  09/16/2020 03:37 PM 5.8 0.0 - 7.0 % Final   Hgb A1c MFr Bld  Date/Time Value Ref Range Status  03/08/2022 04:35 PM 6.2 (H) 4.8 - 5.6 % Final    Comment:             Prediabetes: 5.7 - 6.4          Diabetes: >6.4          Glycemic control for adults with diabetes: <7.0   12/14/2019 05:46 AM 6.1 (H) 4.8 - 5.6 % Final    Comment:    (NOTE) Pre diabetes:          5.7%-6.4%  Diabetes:              >6.4%  Glycemic control for   <7.0% adults with diabetes     CBG: Recent Labs  Lab 07/25/22 2237 07/26/22 0321  GLUCAP 106* 143*    Review of Systems:   All negative; except for those that are bolded,  which indicate positives.  Constitutional: weight loss, weight gain, night sweats, fevers, chills, fatigue, weakness.  HEENT: headaches, sore throat, sneezing, nasal congestion, post nasal drip, difficulty swallowing, tooth/dental problems, visual complaints, visual changes, ear aches. Neuro: difficulty with speech, weakness, numbness, ataxia. CV:  chest pain, orthopnea, PND, swelling in lower extremities, dizziness, palpitations, syncope.  Resp: cough, hemoptysis, dyspnea, wheezing. GI: heartburn, indigestion, abdominal pain, nausea, vomiting, diarrhea, constipation, change in bowel habits, loss of appetite, hematemesis, melena, hematochezia.  GU: dysuria, change in color of urine, urgency or frequency, flank pain, hematuria. MSK: joint pain or swelling, decreased range of motion. Psych: change in mood or affect, depression, anxiety, suicidal ideations, homicidal ideations. Skin: rash, itching, bruising, LLE edema and erythema.   Past Medical History:  He,  has a past medical history of Cancer (HCC), CHF (congestive heart failure) (HCC), Chronic pain of right knee, Coronary artery disease, Hypertension, and Phimosis.   Surgical History:   Past Surgical History:  Procedure Laterality Date   CIRCUMCISION N/A 10/12/2018   Procedure: CIRCUMCISION ADULT;  Surgeon: Marcine Matar, MD;  Location: [facility];  Service: Urology;  Laterality: N/A;  45 mins   CO2 LASER APPLICATION N/A 04/29/2019   Procedure: CO2 LASER APPLICATION;  Surgeon: Marcine Matar, MD;  Location: [facility];  Service: Urology;  Laterality: N/A;  30 MINS   COLONOSCOPY     CORONARY ARTERY BYPASS GRAFT N/A 12/12/2018   Procedure: CORONARY ARTERY BYPASS GRAFTING (CABG) x four, using bilateral internal mammary arteries and right leg greater saphenous vein harvested endoscopically;  Surgeon: Linden Dolin, MD;  Location: [facility] OR;  Service: Open Heart Surgery;  Laterality: N/A;   LEFT HEART CATH AND CORONARY ANGIOGRAPHY N/A  11/28/2018   Procedure: LEFT HEART CATH AND CORONARY ANGIOGRAPHY;  Surgeon: Kathleene Hazel, MD;  Location: [facility] INVASIVE CV [facility];  Service: Cardiovascular;  Laterality: N/A;   MEDIASTINAL EXPLORATION N/A 12/12/2018   Procedure: Mediastinal Re-Exploration after bypass graft;  Surgeon: Linden Dolin, MD;  Location: [facility] OR;  Service: Open Heart Surgery;  Laterality: N/A;   TEE WITHOUT CARDIOVERSION N/A 12/12/2018   Procedure: TRANSESOPHAGEAL ECHOCARDIOGRAM (TEE);  Surgeon: Linden Dolin, MD;  Location: [facility] OR;  Service: Open Heart Surgery;  Laterality: N/A;   TOOTH  EXTRACTION     TOTAL HIP ARTHROPLASTY Right 03/08/2019   Procedure: RIGHT TOTAL HIP ARTHROPLASTY ANTERIOR APPROACH;  Surgeon: Kathryne Hitch, MD;  Location: [facility];  Service: Orthopedics;  Laterality: Right;     Social History:   reports that he has never smoked. He has never used smokeless tobacco. He reports that he does not drink alcohol and does not use drugs.   Family History:  His family history includes Diabetes in his mother; Heart disease in his mother.   Allergies No Known Allergies   Karrie Doffing, MS4 Rosana Berger

## 2022-07-28 LAB — BASIC METABOLIC PANEL
Anion gap: 8 (ref 5–15)
BUN: 43 mg/dL — ABNORMAL HIGH (ref 8–23)
CO2: 20 mmol/L — ABNORMAL LOW (ref 22–32)
Calcium: 7.8 mg/dL — ABNORMAL LOW (ref 8.9–10.3)
Chloride: 109 mmol/L (ref 98–111)
Creatinine, Ser: 1.4 mg/dL — ABNORMAL HIGH (ref 0.61–1.24)
GFR, Estimated: 55 mL/min — ABNORMAL LOW (ref >60)
Glucose, Bld: 113 mg/dL — ABNORMAL HIGH (ref 70–99)
Potassium: 3.6 mmol/L (ref 3.5–5.1)
Sodium: 137 mmol/L (ref 135–145)

## 2022-07-28 LAB — MAGNESIUM: Magnesium: 2.4 mg/dL (ref 1.7–2.4)

## 2022-07-28 LAB — CBC
HCT: 34.1 % — ABNORMAL LOW (ref 39.0–52.0)
Hemoglobin: 11.2 g/dL — ABNORMAL LOW (ref 13.0–17.0)
MCH: 28.1 pg (ref 26.0–34.0)
MCHC: 32.8 g/dL (ref 30.0–36.0)
MCV: 85.7 fL (ref 80.0–100.0)
Platelets: 158 10*3/uL (ref 150–400)
RBC: 3.98 MIL/uL — ABNORMAL LOW (ref 4.22–5.81)
RDW: 14.6 % (ref 11.5–15.5)
WBC: 6.4 10*3/uL (ref 4.0–10.5)
nRBC: 0 % (ref 0.0–0.2)

## 2022-07-28 LAB — PHOSPHORUS: Phosphorus: 2.5 mg/dL (ref 2.5–4.6)

## 2022-07-28 MED ORDER — POTASSIUM CHLORIDE CRYS ER 20 MEQ PO TBCR
40.0000 meq | EXTENDED_RELEASE_TABLET | Freq: Once | ORAL | Status: AC
  Administered 2022-07-28: 40 meq via ORAL
  Filled 2022-07-28: qty 2

## 2022-07-28 MED ORDER — SPIRONOLACTONE 12.5 MG HALF TABLET
12.5000 mg | ORAL_TABLET | Freq: Every day | ORAL | Status: DC
  Administered 2022-07-28: 12.5 mg via ORAL
  Filled 2022-07-28: qty 1

## 2022-07-28 NOTE — Hospital Course (Addendum)
Hospital course:  This is a 67 year old male with past medical history of congestive heart failure, ischemic cardiomyopathy, hyperlipidemia who presented to the emergency department with concerns of generalized weakness and lower left extremity swelling and rash.  Patient also had concerns of diarrhea.  Patient was found to be hypotensive at his primary care physician office and was sent to the emergency department for further evaluation and management.  Patient was evaluated and managed for septic shock in the setting of lower left extremity cellulitis.  Patient was started on broad-spectrum antibiotics with vancomycin and cefepime and admitted for further evaluation management.  #Septic shock secondary to left lower extremity cellulitis, resolving Patient initially presented to the emergency department with concerns of generalized weakness.  He was sent from his primary care physician office for hypotension.  Upon arrival, sepsis protocol was initiated.  Patient had aggressive volume resuscitation.  Blood cultures were taken and patient was started on broad-spectrum antibiotics with vancomycin and cefepime.  Despite fluid resuscitation, patient made hypotensive and required vasopressor therapy with Levophed.  During hospitalization, Levophed was weaned off and antibiotics were transitioned from vancomycin and cefepime to Keflex.  Blood cultures did not grow any microbes.  Blood pressure improved.  Patient was discharged on Keflex.  #History of chronic combined heart failure #Ischemic cardiomyopathy Patient has a past medical history of CAD and combined heart failure in the setting of ischemic cardiomyopathy.  Patient did have echo during hospitalization which did show severely right dilated right ventricle.  Cardiology was consulted, who thought this was not contributory to shock above.  Did hold GDMT in setting of septic shock.  Did start resuming GDMT while inpatient.  We did restart spironolactone.   Other medications restarted were***.  No acute exacerbation concerns during hospitalization.  Continue titrating outpatient.  Patient continued aspirin and atorvastatin during hospitalization.  Other GDMT includes Clifton Custard, carvedilol.   #AKI Patient initially presented with creatinine at 3.27.  Patient likely had AKI in the setting of septic shock and hypoperfusion.  Patient was fluid resuscitated and started on Levophed.  Creatinine during hospitalization improved.  Did slowly titrate back on GDMT medications.  Creatinine on discharged was***.   #Hyponatremia Patient initially was hyponatremic at 132.  Likely secondary to dehydration and volume loss from distributive shock.  Patient was fluid resuscitated, and improved.  Sodium stable on discharge.     Follow-up on:  A) Left lower extremity cellulitis: Ensure patient finishes antibiotics.  Reevaluate leg on follow-up.  B) Combined heart failure: Patient discharged on***.  Continue to titrate GDMT outpatient.  C) AKI: Patient creatinine on discharge was***.  Repeat BMP on discharge.    Patient Instructions:  [name],  It was a pleasure taking care of you at [facility]. You were admitted for septic shock secondary to lower extremity cellulitis.  You were treated with antibiotics. We are discharging you home now that you are doing better. Please follow the following instructions.   1) Regarding your lower extremity cellulitis, we are discharged you on Keflex.  Please finish this antibiotic course.  If you develop any fevers or chills, or worsening redness, please return back to the emergency department.  2) Regarding your heart failure medications, please continue taking***.  Please follow-up with your cardiologist.  3) For hospital follow-up, please follow-up with your primary care physician in 1 week.  4) If you develop any further fevers, chills, syncopal episodes, or significant chest pain or  shortness of breath please return back  to the emergency department.  Take care,  Dr. Marland Kitchen

## 2022-07-28 NOTE — Progress Notes (Addendum)
NAME:  Frank Love, MRN:  [id number], DOB:  01/05/1956, LOS: 3 ADMISSION DATE:  07/25/2022, CONSULTATION DATE:  07/25/22 REFERRING MD:  Suezanne Jacquet CHIEF COMPLAINT:  Weakness, HoTN   History of Present Illness:  Frank Love is a 67 y.o. 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 He presented to [facility] ED 7/1 with generalized weakness and feeling poorly x 3 - 4 days. Symptoms started 6/27 or 6/28 and on 6/28, wife felt he was having a stroke because he was speaking incoherently; however, this resolved on it's own. He was working outside in the heat and felt maybe symptoms were related to that.  Over the next 2 days, he continued to have generalized weakness along with decreased PO intake. 7/1, he felt worse and noticed a new rash on his LLE along with a few bouts of diarrhea which prompted him to seek care from his PCP. While at PCP, SBP noted to be in 70's. He was subsequently referred to th ED for concern for sepsis in setting of LLE cellulitis. He had LLE duplex study given LLE edema and erythema. Prelim results are negative.   In ED, he received 2L IVF and BP remained soft with SBP in 60 and 70 range. Of note, he has chronic hypotension with SBP in the 100 range. Despite SBP reading 60-70 in the ED, he is completely alert and oriented. He feels a bit better after fluids. His rash extends from L ankle all the way up to the medial groin. It is not oozing. He has occasionally had similar rash which has been attributed to cellulitis. He denies any fevers/chills/sweats, chest pain, dyspnea, N/V, abd pain, myalgias. He has had decreased PO intake since 6/28. Of note, he has continued to take his meds daily since symptom onset (to include Carvedilol, furosemide, Entresto, Spironolactone).  Given ongoing hypotension, he was started on low dose Norepinephrine and PCCM asked to evaluate for ICU admission.  Pertinent  Medical  History:  has Chronic systolic CHF (congestive heart failure) (HCC); Preop cardiovascular exam; Abnormal EKG; Coronary artery disease involving native coronary artery of native heart without angina pectoris; Ischemic cardiomyopathy; S/P CABG x 4; Coronary artery disease; Debility; Postoperative anemia due to acute blood loss; Stenosis of carotid artery; Educated about COVID-19 virus infection; Primary osteoarthritis of right hip; Status post total replacement of right hip; COVID-19; Orthostatic hypotension; Hyperlipidemia; Bradycardia; and Septic shock (HCC) on their problem list.  Significant Hospital Events: Including procedures, antibiotic start and stop dates in addition to other pertinent events   7/1: admit  Chest Xray: Left basilar opacities which could represent atelectasis, aspiration, and/or pneumonia. Possible small left pleural effusion. Dedicated PA and lateral radiographs could better assess if clinically warranted.  US Renal: Cortical thinning bilaterally. No obstructive changes are noted.   Started Vancomycin and cefepime   7/2: remains on pressors  Repeat Chest Xray: No active disease.   7/3:  Weaned off pressors. Stopped vancomycin and cefepime. Started Keflex.   Interim History / Subjective:  No acute events overnight.  Patient evaluated bedside this morning.  Objective:  Blood pressure (!) 100/53, pulse 61, temperature (!) 97.5 F (36.4 C), temperature source Oral, resp. rate 19, height 6\' 2"  (1.88 m), weight 126.9 kg, SpO2 95 %.  Temperature 97.5 F - 98.4 F, pulse 52-72, respirations 13-26, oxygen saturation 92-95% on room air, blood pressure with MAP ranging 64-67 with systolics in the  mid 90s.        Intake/Output Summary (Last 24 hours) at 07/28/2022 0758 Last data filed at 07/27/2022 2300 Gross per 24 hour  Intake 1408.88 ml  Output 1650 ml  Net -241.12 ml    Filed Weights   07/26/22 0203 07/27/22 0500 07/28/22 0500  Weight: 134.4 kg 126.9 kg 126.9 kg     Examination: General: Elderly, sitting in chair at bedside, in no acute distress Neuro: Alert and oriented x 3, no focal deficits HEENT: Moist oral mucosa Cardiovascular: S1-S2 appreciated with no murmur, rubs, or gallops Lungs: Clear breath sounds bilaterally, NWOB Abdomen: Normoactive bowel sounds, obese, soft, NTND  Musculoskeletal: Redness, erythema of the left lower extremity up to below the knee Skin: Bilateral lower extremity swelling with erythema on the left  Labs/imaging personally reviewed:  Magnesium 2.4 Phosphorus2.5 BMP showing sodium 137 (140), K3.6 (3.2), bicarb 20 (20), creatinine 1.4 (1.55) CBC: White count 6.4 (6.1), hemoglobin 11.2 (12.6), platelet 158 (146)   Assessment & Plan:   #Septic shock secondary to left lower extremity cellulitis, resolving Patient valuated bedside this morning.  Patient doing well.  He is up and sitting up in recliner.  Eating.  No fevers overnight.  Erythema around left lower extremity significant with swelling.  No drainage appreciated.  Repeat echocardiogram not much different from previous per Dr. Shirlee Latch.  No concern for cardiogenic shock at this time. -Continue Keflex for total 7 days, (day 4 of Keflex of 7) -Patient has been weaned off pressors -Continue to follow cultures -Monitor fever curve -Monitor white count  #History of chronic combined heart failure Coronary artery disease with prior ejection fraction of 35-40% (2022). Long standing hypertension.  Patient has history of ischemic cardiomyopathy.  Echo yesterday did not show much different from previous echo.  Cardiology was consulted for severely right dilated ventricle, but no interventions planned.  Etiology likely septic shock. -Holding home carvedilol, Farxiga,  Entresto, -Continue home aspirin atorvastatin -Cardiology following, appreciate recs, who is resuming lasix and spironolactone   #AKI (improving) AKI is improving.  Creatinine down to 1.4.  Good urine  output.  Baseline creatinine around 1.2-1.3.  AKI likely secondary to ATN in setting of hypoperfusion from septic shock. -Monitor BMP -Avoid nephrotoxic agents -Monitor urine output  #Hyponatremia (resolved) Stable at 137.  No acute concerns.  Stable for transfer out of ICU today.  Best practice (evaluated daily):  Diet/type: Regular consistency (see orders) DVT prophylaxis: prophylactic heparin  GI prophylaxis: N/A Lines: N/A Foley:  N/A Code Status:  full code Last date of multidisciplinary goals of care discussion: None yet.  Labs   CBC: Recent Labs  Lab 07/25/22 1720 07/26/22 0420 07/27/22 1346 07/28/22 0204  WBC 9.7 10.3 6.4 6.4  NEUTROABS 8.3*  --   --   --   HGB 13.8 13.4 12.6* 11.2*  HCT 43.5 38.5* 38.6* 34.1*  MCV 87.9 82.8 86.0 85.7  PLT 140* 135* 146* 158     Basic Metabolic Panel: Recent Labs  Lab 07/25/22 1720 07/26/22 0156 07/26/22 0937 07/27/22 1346 07/28/22 0204  NA 132* 136 137 140 137  K 3.9 3.0* 3.6 3.2* 3.6  CL 100 103 109 107 109  CO2 16* 15* 16* 20* 20*  GLUCOSE 139* 142* 168* 112* 113*  BUN 102* 96* 82* 53* 43*  CREATININE 3.27* 2.97* 2.51* 1.55* 1.40*  CALCIUM 8.0* 7.6* 7.9* 8.2* 7.8*  MG  --  2.6*  --   --  2.4  PHOS  --  4.7*  --   --  2.5    GFR: Estimated Creatinine Clearance: 72.5 mL/min (A) (by C-G formula based on SCr of 1.4 mg/dL (H)). Recent Labs  Lab 07/25/22 1720 07/25/22 1842 07/26/22 0420 07/27/22 1346 07/28/22 0204  WBC 9.7  --  10.3 6.4 6.4  LATICACIDVEN 1.9 1.9  --   --   --      Liver Function Tests: Recent Labs  Lab 07/25/22 1720 07/27/22 1346  AST 44* 29  ALT 42 30  ALKPHOS 76 77  BILITOT 1.4* 0.9  PROT 7.1 6.3*  ALBUMIN 2.9* 2.3*    No results for input(s): "LIPASE", "AMYLASE" in the last 168 hours. No results for input(s): "AMMONIA" in the last 168 hours.  ABG    Component Value Date/Time   PHART 7.470 (H) 12/13/2019 2335   PCO2ART 22.4 (L) 12/13/2019 2335   PO2ART 118 (H)  12/13/2019 2335   HCO3 16.3 (L) 12/13/2019 2335   TCO2 17 (L) 12/13/2019 2335   ACIDBASEDEF 6.0 (H) 12/13/2019 2335   O2SAT 99.0 12/13/2019 2335     Coagulation Profile: Recent Labs  Lab 07/26/22 0156  INR 1.2     Cardiac Enzymes: No results for input(s): "CKTOTAL", "CKMB", "CKMBINDEX", "TROPONINI" in the last 168 hours.  HbA1C: HbA1c, POC (prediabetic range)  Date/Time Value Ref Range Status  05/07/2021 09:00 AM 5.8 5.7 - 6.4 % Final   HbA1c, POC (controlled diabetic range)  Date/Time Value Ref Range Status  09/16/2020 03:37 PM 5.8 0.0 - 7.0 % Final   Hgb A1c MFr Bld  Date/Time Value Ref Range Status  03/08/2022 04:35 PM 6.2 (H) 4.8 - 5.6 % Final    Comment:             Prediabetes: 5.7 - 6.4          Diabetes: >6.4          Glycemic control for adults with diabetes: <7.0   12/14/2019 05:46 AM 6.1 (H) 4.8 - 5.6 % Final    Comment:    (NOTE) Pre diabetes:          5.7%-6.4%  Diabetes:              >6.4%  Glycemic control for   <7.0% adults with diabetes     CBG: Recent Labs  Lab 07/25/22 2237 07/26/22 0321  GLUCAP 106* 143*     Review of Systems:   All negative; except for those that are bolded, which indicate positives.  Constitutional: weight loss, weight gain, night sweats, fevers, chills, fatigue, weakness.  HEENT: headaches, sore throat, sneezing, nasal congestion, post nasal drip, difficulty swallowing, tooth/dental problems, visual complaints, visual changes, ear aches. Neuro: difficulty with speech, weakness, numbness, ataxia. CV:  chest pain, orthopnea, PND, swelling in lower extremities, dizziness, palpitations, syncope.  Resp: cough, hemoptysis, dyspnea, wheezing. GI: heartburn, indigestion, abdominal pain, nausea, vomiting, diarrhea, constipation, change in bowel habits, loss of appetite, hematemesis, melena, hematochezia.  GU: dysuria, change in color of urine, urgency or frequency, flank pain, hematuria. MSK: joint pain or swelling,  decreased range of motion. Psych: change in mood or affect, depression, anxiety, suicidal ideations, homicidal ideations. Skin: rash, itching, bruising, LLE edema and erythema.   Past Medical History:  He,  has a past medical history of Cancer (HCC), CHF (congestive heart failure) (HCC), Chronic pain of right knee, Coronary artery disease, Hypertension, and Phimosis.   Surgical History:   Past Surgical History:  Procedure Laterality Date   CIRCUMCISION N/A 10/12/2018   Procedure: CIRCUMCISION ADULT;  Surgeon:  Marcine Matar, MD;  Location: [facility];  Service: Urology;  Laterality: N/A;  45 mins   CO2 LASER APPLICATION N/A 04/29/2019   Procedure: CO2 LASER APPLICATION;  Surgeon: Marcine Matar, MD;  Location: [facility];  Service: Urology;  Laterality: N/A;  30 MINS   COLONOSCOPY     CORONARY ARTERY BYPASS GRAFT N/A 12/12/2018   Procedure: CORONARY ARTERY BYPASS GRAFTING (CABG) x four, using bilateral internal mammary arteries and right leg greater saphenous vein harvested endoscopically;  Surgeon: Linden Dolin, MD;  Location: [facility] OR;  Service: Open Heart Surgery;  Laterality: N/A;   LEFT HEART CATH AND CORONARY ANGIOGRAPHY N/A 11/28/2018   Procedure: LEFT HEART CATH AND CORONARY ANGIOGRAPHY;  Surgeon: Kathleene Hazel, MD;  Location: [facility] INVASIVE CV LAB;  Service: Cardiovascular;  Laterality: N/A;   MEDIASTINAL EXPLORATION N/A 12/12/2018   Procedure: Mediastinal Re-Exploration after bypass graft;  Surgeon: Linden Dolin, MD;  Location: [facility] OR;  Service: Open Heart Surgery;  Laterality: N/A;   TEE WITHOUT CARDIOVERSION N/A 12/12/2018   Procedure: TRANSESOPHAGEAL ECHOCARDIOGRAM (TEE);  Surgeon: Linden Dolin, MD;  Location: [facility] OR;  Service: Open Heart Surgery;  Laterality: N/A;   TOOTH EXTRACTION     TOTAL HIP ARTHROPLASTY Right 03/08/2019   Procedure: RIGHT TOTAL HIP ARTHROPLASTY ANTERIOR APPROACH;  Surgeon: Kathryne Hitch, MD;  Location: [facility];  Service:  Orthopedics;  Laterality: Right;     Social History:   reports that he has never smoked. He has never used smokeless tobacco. He reports that he does not drink alcohol and does not use drugs.   Family History:  His family history includes Diabetes in his mother; Heart disease in his mother.   Allergies No Known Allergies   Modena Slater, DO Internal medicine resident PGY 2 [phone]

## 2022-07-28 NOTE — Progress Notes (Addendum)
Pt transferred to 5W25 with all belongings VSS, no issues. Receiving RN at bedside. Pt states he will call wife to update her on transfer.

## 2022-07-28 NOTE — Progress Notes (Signed)
Patient ID: Frank Love, male   DOB: 14-Jul-1955, 67 y.o.   MRN: [id number]     Advanced Heart Failure Rounding Note  PCP-Cardiologist: Rollene Rotunda, MD   Subjective:    No complaints this morning.  SBP 90s.  Creatinine trending down 1.4.    Objective:   Weight Range: 126.9 kg Body mass index is 35.92 kg/m.   Vital Signs:   Temp:  [97.5 F (36.4 C)-98.4 F (36.9 C)] 97.5 F (36.4 C) (07/04 0716) Pulse Rate:  [52-68] 63 (07/04 0900) Resp:  [13-26] 21 (07/04 0900) BP: (76-104)/(45-65) 94/51 (07/04 0900) SpO2:  [81 %-99 %] 94 % (07/04 0900) Weight:  [126.9 kg] 126.9 kg (07/04 0500) Last BM Date : 07/28/22  Weight change: Filed Weights   07/26/22 0203 07/27/22 0500 07/28/22 0500  Weight: 134.4 kg 126.9 kg 126.9 kg    Intake/Output:   Intake/Output Summary (Last 24 hours) at 07/28/2022 0924 Last data filed at 07/28/2022 0800 Gross per 24 hour  Intake 1323.2 ml  Output 1150 ml  Net 173.2 ml      Physical Exam    General:  Well appearing. No resp difficulty HEENT: Normal Neck: Supple. JVP 8 cm. Carotids 2+ bilat; no bruits. No lymphadenopathy or thyromegaly appreciated. Cor: PMI nondisplaced. Regular rate & rhythm. No rubs, gallops or murmurs. Lungs: Clear Abdomen: Soft, nontender, nondistended. No hepatosplenomegaly. No bruits or masses. Good bowel sounds. Extremities: No cyanosis, clubbing, edema.  Left lower leg erythema.  Neuro: Alert & orientedx3, cranial nerves grossly intact. moves all 4 extremities w/o difficulty. Affect pleasant   Telemetry   NSR 60s  Labs    CBC Recent Labs    07/25/22 1720 07/26/22 0420 07/27/22 1346 07/28/22 0204  WBC 9.7   < > 6.4 6.4  NEUTROABS 8.3*  --   --   --   HGB 13.8   < > 12.6* 11.2*  HCT 43.5   < > 38.6* 34.1*  MCV 87.9   < > 86.0 85.7  PLT 140*   < > 146* 158   < > = values in this interval not displayed.   Basic Metabolic Panel Recent Labs    78/29/56 0156 07/26/22 0937 07/27/22 1346  07/28/22 0204  NA 136   < > 140 137  K 3.0*   < > 3.2* 3.6  CL 103   < > 107 109  CO2 15*   < > 20* 20*  GLUCOSE 142*   < > 112* 113*  BUN 96*   < > 53* 43*  CREATININE 2.97*   < > 1.55* 1.40*  CALCIUM 7.6*   < > 8.2* 7.8*  MG 2.6*  --   --  2.4  PHOS 4.7*  --   --  2.5   < > = values in this interval not displayed.   Liver Function Tests Recent Labs    07/25/22 1720 07/27/22 1346  AST 44* 29  ALT 42 30  ALKPHOS 76 77  BILITOT 1.4* 0.9  PROT 7.1 6.3*  ALBUMIN 2.9* 2.3*   No results for input(s): "LIPASE", "AMYLASE" in the last 72 hours. Cardiac Enzymes No results for input(s): "CKTOTAL", "CKMB", "CKMBINDEX", "TROPONINI" in the last 72 hours.  BNP: BNP (last 3 results) Recent Labs    09/30/21 0911 03/17/22 1227 04/20/22 1141  BNP 243.5* 186.2* 193.3*    ProBNP (last 3 results) No results for input(s): "PROBNP" in the last 8760 hours.   D-Dimer No results for input(s): "DDIMER" in  the last 72 hours. Hemoglobin A1C No results for input(s): "HGBA1C" in the last 72 hours. Fasting Lipid Panel No results for input(s): "CHOL", "HDL", "LDLCALC", "TRIG", "CHOLHDL", "LDLDIRECT" in the last 72 hours. Thyroid Function Tests No results for input(s): "TSH", "T4TOTAL", "T3FREE", "THYROIDAB" in the last 72 hours.  Invalid input(s): "FREET3"  Other results:   Imaging    No results found.   Medications:     Scheduled Medications:  aspirin  81 mg Oral Daily   atorvastatin  40 mg Oral q1800   cephALEXin  500 mg Oral Q6H   Chlorhexidine Gluconate Cloth  6 each Topical Q0600   furosemide  40 mg Oral Daily   heparin  5,000 Units Subcutaneous Q8H   melatonin  3 mg Oral QHS   potassium chloride  40 mEq Oral Once   spironolactone  12.5 mg Oral Daily    Infusions:  sodium chloride 250 mL (07/27/22 2200)   norepinephrine (LEVOPHED) Adult infusion Stopped (07/27/22 0708)    PRN Medications: acetaminophen, docusate sodium, polyethylene glycol   Assessment/Plan    1.  Shock: Felt to be primarily septic from left lower extremity cellulitis.  Improved with treatment of cellulitis, off NE with SBP 90s.  2. Acute on chronic biventricular heart failure: Ischemic cardiomyopathy.  Cardiac MRI in 9/22 showed showed LVEF 33%, RVEF 30%, subendocardial LGE suggestive of coronary disease.  I reviewed echo this admission, EF looks 30-35% range to me with moderate RV dilation/moderate dysfunction. This may not be significantly different from 9/22 cMRI. Ischemic cardiomyopathy. He has refused ICD in the past. Off GDMT due to septic shock with hypotension and AKI. Creatinine trending down at 1.4 today, SBP 90s. Tolerated 4 drug regimen of GDMT prior to admission. He is not markedly volume overloaded.  - Start back on Lasix 40 mg daily.  - Start spironolactone 12.5 daily.  - Resume other GDMT as BP and renal function allow.  3. AKI: Baseline Scr 1.2-1.5, up to 3.27 on admit in setting of shock. Creatinine trending down, 1.4 currently.  4. CAD: S/p CABG in 2020. No chest pain.  - Continue aspirin and statin 5. Left lower extremity cellulitis: With associated septic shock, now resolved.  - Abx narrowed to cephalexin.   Length of Stay: 3  Marca Ancona, MD  07/28/2022, 9:24 AM  Advanced Heart Failure Team Pager [phone] (M-F; 7a - 5p)  Please contact [facility] for night-coverage after hours (5p -7a ) and weekends on [email or web address]

## 2022-07-29 LAB — URINALYSIS, ROUTINE W REFLEX MICROSCOPIC
Bacteria, UA: NONE SEEN
Bilirubin Urine: NEGATIVE
Glucose, UA: 150 mg/dL — AB
Ketones, ur: NEGATIVE mg/dL
Leukocytes,Ua: NEGATIVE
Nitrite: NEGATIVE
Protein, ur: 30 mg/dL — AB
Specific Gravity, Urine: 1.023 (ref 1.005–1.030)
pH: 5 (ref 5.0–8.0)

## 2022-07-29 LAB — CBC
HCT: 34.5 % — ABNORMAL LOW (ref 39.0–52.0)
Hemoglobin: 11 g/dL — ABNORMAL LOW (ref 13.0–17.0)
MCH: 28 pg (ref 26.0–34.0)
MCHC: 31.9 g/dL (ref 30.0–36.0)
MCV: 87.8 fL (ref 80.0–100.0)
Platelets: 177 10*3/uL (ref 150–400)
RBC: 3.93 MIL/uL — ABNORMAL LOW (ref 4.22–5.81)
RDW: 14.8 % (ref 11.5–15.5)
WBC: 6.2 10*3/uL (ref 4.0–10.5)
nRBC: 0 % (ref 0.0–0.2)

## 2022-07-29 LAB — BASIC METABOLIC PANEL
Anion gap: 8 (ref 5–15)
BUN: 35 mg/dL — ABNORMAL HIGH (ref 8–23)
CO2: 20 mmol/L — ABNORMAL LOW (ref 22–32)
Calcium: 7.6 mg/dL — ABNORMAL LOW (ref 8.9–10.3)
Chloride: 107 mmol/L (ref 98–111)
Creatinine, Ser: 1.16 mg/dL (ref 0.61–1.24)
GFR, Estimated: >60 mL/min (ref >60)
Glucose, Bld: 111 mg/dL — ABNORMAL HIGH (ref 70–99)
Potassium: 3.9 mmol/L (ref 3.5–5.1)
Sodium: 135 mmol/L (ref 135–145)

## 2022-07-29 LAB — C-REACTIVE PROTEIN: CRP: 9.3 mg/dL — ABNORMAL HIGH (ref <1.0)

## 2022-07-29 LAB — PROCALCITONIN: Procalcitonin: 0.21 ng/mL

## 2022-07-29 LAB — TSH: TSH: 1.336 u[IU]/mL (ref 0.350–4.500)

## 2022-07-29 LAB — MRSA NEXT GEN BY PCR, NASAL: MRSA by PCR Next Gen: NOT DETECTED

## 2022-07-29 LAB — MAGNESIUM: Magnesium: 2.3 mg/dL (ref 1.7–2.4)

## 2022-07-29 LAB — BRAIN NATRIURETIC PEPTIDE: B Natriuretic Peptide: 613.4 pg/mL — ABNORMAL HIGH (ref 0.0–100.0)

## 2022-07-29 LAB — CORTISOL: Cortisol, Plasma: 14.2 ug/dL

## 2022-07-29 MED ORDER — FUROSEMIDE 20 MG PO TABS
20.0000 mg | ORAL_TABLET | Freq: Every day | ORAL | Status: DC
  Administered 2022-07-30: 20 mg via ORAL
  Filled 2022-07-29: qty 1

## 2022-07-29 MED ORDER — DAPAGLIFLOZIN PROPANEDIOL 10 MG PO TABS
10.0000 mg | ORAL_TABLET | Freq: Every day | ORAL | Status: DC
  Administered 2022-07-29 – 2022-07-30 (×2): 10 mg via ORAL
  Filled 2022-07-29 (×2): qty 1

## 2022-07-29 MED ORDER — POTASSIUM CHLORIDE CRYS ER 20 MEQ PO TBCR
40.0000 meq | EXTENDED_RELEASE_TABLET | Freq: Once | ORAL | Status: AC
  Administered 2022-07-29: 40 meq via ORAL
  Filled 2022-07-29: qty 2

## 2022-07-29 MED ORDER — SPIRONOLACTONE 12.5 MG HALF TABLET
12.5000 mg | ORAL_TABLET | Freq: Every day | ORAL | Status: DC
  Administered 2022-07-29 – 2022-07-30 (×2): 12.5 mg via ORAL
  Filled 2022-07-29 (×2): qty 1

## 2022-07-29 NOTE — TOC Initial Note (Addendum)
Transition of Care [facility]) - Initial/Assessment Note    Patient Details  Name: Frank Love MRN: [id number] Date of Birth: [birth year]  [facility] of [facility]) CM/SW Contact:    Reva Bores, LCSWA Phone Number: 07/29/2022, 1:42 PM  Clinical Narrative:  CSW met with pt and his wife at the bedside. CSW completed the SDOH with them. Pt and wife stated that the only financial strain is operating with a fixed income, but all of their social needs are met. Pt stated that he has a walker and cane at home. Pt stated that he would like a blood pressure monitor if available. CSW called to inform pt where to retrieve item. TOC will continue to follow.            Expected Discharge Plan: Home/Self Care     Patient Goals and CMS Choice Patient states their goals for this hospitalization and ongoing recovery are:: return home          Expected Discharge Plan and Services       Living arrangements for the past 2 months: Mobile Home                                      Prior Living Arrangements/Services Living arrangements for the past 2 months: Mobile Home Lives with:: Spouse Patient language and need for interpreter reviewed:: Yes Do you feel safe going back to the place where you live?: Yes      Need for Family Participation in Patient Care: No (Comment) Care giver support system in place?: Yes (comment)   Criminal Activity/Legal Involvement Pertinent to Current Situation/Hospitalization: No - Comment as needed  Activities of Daily Living      Permission Sought/Granted                  Emotional Assessment Appearance:: Appears stated age Attitude/Demeanor/Rapport: Engaged Affect (typically observed): Appropriate Orientation: : Oriented to Self, Oriented to Place, Oriented to  Time, Oriented to Situation Alcohol / Substance Use: Never Used Psych Involvement: No (comment)  Admission diagnosis:  Cellulitis of left lower extremity [L03.116] Septic shock  (HCC) [A41.9, R65.21] Sepsis with acute renal failure and septic shock, due to unspecified organism, unspecified acute renal failure type (HCC) [A41.9, R65.21, N17.9] Patient Active Problem List   Diagnosis Date Noted   Septic shock (HCC) 07/25/2022   Bradycardia 03/30/2022   Hyperlipidemia 04/29/2021   Orthostatic hypotension    COVID-19 12/13/2019   Status post total replacement of right hip 03/08/2019   Primary osteoarthritis of right hip 02/08/2019   Stenosis of carotid artery 01/02/2019   Educated about COVID-19 virus infection 01/02/2019   Postoperative anemia due to acute blood loss 12/31/2018   Debility 12/21/2018   S/P CABG x 4 12/12/2018   Coronary artery disease 12/12/2018   Coronary artery disease involving native coronary artery of native heart without angina pectoris    Ischemic cardiomyopathy    Chronic systolic CHF (congestive heart failure) (HCC) 10/28/2018   Preop cardiovascular exam 10/28/2018   Abnormal EKG 10/28/2018   PCP:  Claiborne Rigg, NP Pharmacy:   [facility] - [facility] [address] [place] [address] Phone: [phone] Fax: [phone]  [facility] [place], [place] - [address] [place] [address] Phone: [phone] Fax: [phone]  [place] - [facility]  [address] [place] [address] Phone: [phone] Fax: [phone]  [facility] - [place], [place] - [address] [place] [address] Phone: [phone] Fax: [phone]     Social Determinants of Health (SDOH) Social History: SDOH Screenings   Food Insecurity: No Food Insecurity (07/29/2022)  Housing: Low Risk  (07/29/2022)  Transportation Needs: No Transportation Needs (07/29/2022)  Utilities: Not At Risk (07/29/2022)  Alcohol Screen: Low Risk  (07/29/2022)  Depression (PHQ2-9): Low Risk  (07/25/2022)  Financial Resource  Strain: High Risk (12/28/2021)  Physical Activity: Inactive (07/29/2022)  Stress: No Stress Concern Present (07/29/2022)  Tobacco Use: Low Risk  (07/25/2022)   SDOH Interventions: Food Insecurity Interventions: Intervention Not Indicated Housing Interventions: Intervention Not Indicated Transportation Interventions: Intervention Not Indicated Utilities Interventions: Intervention Not Indicated Alcohol Usage Interventions: Intervention Not Indicated (Score <7) Physical Activity Interventions: Intervention Not Indicated Stress Interventions: Intervention Not Indicated   Readmission Risk Interventions     No data to display

## 2022-07-29 NOTE — Plan of Care (Signed)
  Problem: Education: Goal: Knowledge of General Education information will improve Description: Including pain rating scale, medication(s)/side effects and non-pharmacologic comfort measures Outcome: Progressing   Problem: Health Behavior/Discharge Planning: Goal: Ability to manage health-related needs will improve Outcome: Progressing   Problem: Clinical Measurements: Goal: Ability to maintain clinical measurements within normal limits will improve Outcome: Progressing Goal: Will remain free from infection Outcome: Progressing Goal: Diagnostic test results will improve Outcome: Progressing   Problem: Elimination: Goal: Will not experience complications related to urinary retention Outcome: Progressing   Problem: Pain Managment: Goal: General experience of comfort will improve Outcome: Progressing   Problem: Safety: Goal: Ability to remain free from injury will improve Outcome: Progressing   Problem: Skin Integrity: Goal: Risk for impaired skin integrity will decrease Outcome: Progressing   

## 2022-07-29 NOTE — Evaluation (Signed)
Physical Therapy Evaluation Patient Details Name: Frank Love MRN: [id number] DOB: 03/02/1955 Today's Date: 07/29/2022  History of Present Illness  67 y/o M admitted to [facility] on 7/1 with generalized weakness and feeling poorly for a few days. Workup revealing LLE cellulitis and early sepsis with toxic encephalopathy. PMHx: CHF, CAD CABG x4, HLD  Clinical Impression  Pt presents today with impaired functional mobility due to LLE cellulitis. Pt reports he has experienced this before, and typically uses his RW until it resolves. At baseline, pt is independent with all mobility at home, denying any falls. Pt tolerated today's session well, able to perform transfers and ambulation with supervision and use of RW. Pt has antalgic gait but tolerates mobility well, no LOB noted, with good management of RW around obstacles in busy hallways. Pt declines attempting stairs today, reports he knows how to manage them, reviewed proper technique and pt verbalizing understanding. Encouraged pt to continue to mobilize during admission as he tolerates. Pt reports no concerns with mobility upon discharge, recommend return home, no current need for PT once home, acute PT will sign off.         Assistance Recommended at Discharge Intermittent Supervision/Assistance  If plan is discharge home, recommend the following:  Can travel by private vehicle  A little help with walking and/or transfers;Help with stairs or ramp for entrance;Assistance with cooking/housework        Equipment Recommendations None recommended by PT  Recommendations for Other Services       Functional Status Assessment Patient has had a recent decline in their functional status and demonstrates the ability to make significant improvements in function in a reasonable and predictable amount of time.     Precautions / Restrictions Precautions Precautions: Fall;Other (comment) Precaution Comments: LLE cellulitis Restrictions Weight Bearing  Restrictions: No      Mobility  Bed Mobility               General bed mobility comments: pt seated in chair upon arrival, ended session in chair. Pt reports he sleeps in his recliner at baseline    Transfers Overall transfer level: Needs assistance Equipment used: Rolling walker (2 wheels) Transfers: Sit to/from Stand Sit to Stand: Supervision           General transfer comment: supervision for safety and line management    Ambulation/Gait Ambulation/Gait assistance: Supervision Gait Distance (Feet): 150 Feet Assistive device: Rolling walker (2 wheels) Gait Pattern/deviations: Step-through pattern, Antalgic, Trunk flexed, Decreased step length - left, Decreased dorsiflexion - left Gait velocity: decreased     General Gait Details: antalgic gait on LLE with decreased L step length and foot clearance. Downward gaze intermittently. Supervision for safety and line management, pt with good obstacle negotiation and RW management  Stairs            Wheelchair Mobility     Tilt Bed    Modified Rankin (Stroke Patients Only)       Balance Overall balance assessment: Needs assistance Sitting-balance support: No upper extremity supported, Feet supported Sitting balance-Leahy Scale: Good     Standing balance support: Bilateral upper extremity supported, During functional activity, Reliant on assistive device for balance Standing balance-Leahy Scale: Poor Standing balance comment: reliant on RW due to pain with mobility                             Pertinent Vitals/Pain Pain Assessment Pain Assessment: 0-10 Pain Score: 7  Pain  Location: L foot Pain Descriptors / Indicators: Aching, Discomfort Pain Intervention(s): Limited activity within patient's tolerance, Monitored during session, Repositioned    Home Living Family/patient expects to be discharged to:: Private residence Living Arrangements: Spouse/significant other Available Help at  Discharge: Family;Available 24 hours/day Type of Home: Mobile home Home Access: Stairs to enter Entrance Stairs-Rails: Right;Left;Can reach both Entrance Stairs-Number of Steps: 4   Home Layout: One level Home Equipment: Agricultural consultant (2 wheels);Cane - single point Additional Comments: sleeps in chair    Prior Function Prior Level of Function : Independent/Modified Independent;Driving             Mobility Comments: independent, no AD, denies any falls ADLs Comments: independent, drives     Hand Dominance   Dominant Hand: Right    Extremity/Trunk Assessment   Upper Extremity Assessment Upper Extremity Assessment: Overall WFL for tasks assessed    Lower Extremity Assessment Lower Extremity Assessment: LLE deficits/detail LLE Deficits / Details: redness and swelling to LLE, able to move against gravity but no resistance applied due to pain, sensation intact    Cervical / Trunk Assessment Cervical / Trunk Assessment: Normal  Communication   Communication: No difficulties  Cognition Arousal/Alertness: Awake/alert Behavior During Therapy: WFL for tasks assessed/performed Overall Cognitive Status: Within Functional Limits for tasks assessed                                 General Comments: A&Ox4, pleasant throughout        General Comments General comments (skin integrity, edema, etc.): VSS on room air    Exercises     Assessment/Plan    PT Assessment Patient does not need any further PT services  PT Problem List         PT Treatment Interventions      PT Goals (Current goals can be found in the Care Plan section)  Acute Rehab PT Goals Patient Stated Goal: go home PT Goal Formulation: All assessment and education complete, DC therapy    Frequency       Co-evaluation               AM-PAC PT "6 Clicks" Mobility  Outcome Measure Help needed turning from your back to your side while in a flat bed without using bedrails?:  None Help needed moving from lying on your back to sitting on the side of a flat bed without using bedrails?: None Help needed moving to and from a bed to a chair (including a wheelchair)?: A Little Help needed standing up from a chair using your arms (e.g., wheelchair or bedside chair)?: A Little Help needed to walk in hospital room?: A Little Help needed climbing 3-5 steps with a railing? : A Little 6 Click Score: 20    End of Session Equipment Utilized During Treatment: Gait belt Activity Tolerance: Patient tolerated treatment well Patient left: in chair;with call bell/phone within reach Nurse Communication: Mobility status PT Visit Diagnosis: Other abnormalities of gait and mobility (R26.89)    Time: 4098-1191 PT Time Calculation (min) (ACUTE ONLY): 17 min   Charges:   PT Evaluation $PT Eval Moderate Complexity: 1 Mod   PT General Charges $$ ACUTE PT VISIT: 1 Visit         Lindalou Hose, PT DPT Acute Rehabilitation Services Office [phone]   Frank Love 07/29/2022, 2:02 PM

## 2022-07-29 NOTE — Care Management Important Message (Signed)
Important Message  Patient Details  Name: ROMIL GILLY MRN: [id number] Date of Birth: December 15, 1955   Medicare Important Message Given:  Yes     Kijuan Gallicchio Stefan Church 07/29/2022, 3:56 PM

## 2022-07-29 NOTE — Progress Notes (Addendum)
Patient ID: Frank Love, male   DOB: [birth year], 67 y.o.   MRN: [id number]     Advanced Heart Failure Rounding Note  PCP-Cardiologist: Rollene Rotunda, MD   Subjective:    No complaints, sitting up in chair. Denies CP/SOB. SCr 1.40>1.16   Objective:   Weight Range: 133.6 kg Body mass index is 37.82 kg/m.   Vital Signs:   Temp:  [97.6 F (36.4 C)-99.1 F (37.3 C)] 98 F (36.7 C) (07/05 0625) Pulse Rate:  [56-69] 68 (07/05 0625) Resp:  [14-19] 16 (07/05 0625) BP: (91-114)/(51-76) 114/63 (07/05 0625) SpO2:  [93 %-97 %] 94 % (07/05 0625) Weight:  [133.6 kg] 133.6 kg (07/05 0500) Last BM Date : 07/28/22  Weight change: Filed Weights   07/27/22 0500 07/28/22 0500 07/29/22 0500  Weight: 126.9 kg 126.9 kg 133.6 kg    Intake/Output:   Intake/Output Summary (Last 24 hours) at 07/29/2022 0926 Last data filed at 07/28/2022 1300 Gross per 24 hour  Intake 34742 ml  Output --  Net 12290 ml      Physical Exam   General:  well appearing.  No respiratory difficulty HEENT: normal Neck: supple. JVD flat. Carotids 2+ bilat; no bruits. No lymphadenopathy or thyromegaly appreciated. Cor: PMI nondisplaced. Regular rate & rhythm. No rubs, gallops or murmurs. Lungs: clear Abdomen: soft, nontender, nondistended. No hepatosplenomegaly. No bruits or masses. Good bowel sounds. Extremities: no cyanosis, clubbing, rash, LLE +2 edema +erythema and warm to touch Neuro: alert & oriented x 3, cranial nerves grossly intact. moves all 4 extremities w/o difficulty. Affect pleasant.  Telemetry   NSR 80s 5-20 PVCs/hr (Personally reviewed)    Labs    CBC Recent Labs    07/28/22 0204 07/29/22 0426  WBC 6.4 6.2  HGB 11.2* 11.0*  HCT 34.1* 34.5*  MCV 85.7 87.8  PLT 158 177   Basic Metabolic Panel Recent Labs    59/56/38 0204 07/29/22 0426  NA 137 135  K 3.6 3.9  CL 109 107  CO2 20* 20*  GLUCOSE 113* 111*  BUN 43* 35*  CREATININE 1.40* 1.16  CALCIUM 7.8* 7.6*  MG 2.4 2.3   PHOS 2.5  --    Liver Function Tests Recent Labs    07/27/22 1346  AST 29  ALT 30  ALKPHOS 77  BILITOT 0.9  PROT 6.3*  ALBUMIN 2.3*   No results for input(s): "LIPASE", "AMYLASE" in the last 72 hours. Cardiac Enzymes No results for input(s): "CKTOTAL", "CKMB", "CKMBINDEX", "TROPONINI" in the last 72 hours.  BNP: BNP (last 3 results) Recent Labs    03/17/22 1227 04/20/22 1141 07/29/22 0756  BNP 186.2* 193.3* 613.4*    ProBNP (last 3 results) No results for input(s): "PROBNP" in the last 8760 hours.   D-Dimer No results for input(s): "DDIMER" in the last 72 hours. Hemoglobin A1C No results for input(s): "HGBA1C" in the last 72 hours. Fasting Lipid Panel No results for input(s): "CHOL", "HDL", "LDLCALC", "TRIG", "CHOLHDL", "LDLDIRECT" in the last 72 hours. Thyroid Function Tests Recent Labs    07/29/22 0756  TSH 1.336   Other results:  Imaging   No results found.  Medications:   Scheduled Medications:  aspirin  81 mg Oral Daily   atorvastatin  40 mg Oral q1800   cephALEXin  500 mg Oral Q6H   Chlorhexidine Gluconate Cloth  6 each Topical Q0600   heparin  5,000 Units Subcutaneous Q8H   melatonin  3 mg Oral QHS    Infusions:  sodium chloride 250  mL (07/27/22 2200)    PRN Medications: acetaminophen, docusate sodium, polyethylene glycol  Assessment/Plan  1.  Shock: Felt to be primarily septic from left lower extremity cellulitis.  Improved with treatment of cellulitis, off NE with SBP 90s.  2. Acute on chronic biventricular heart failure: Ischemic cardiomyopathy.  Cardiac MRI in 9/22 showed showed LVEF 33%, RVEF 30%, subendocardial LGE suggestive of coronary disease.  I reviewed echo this admission, EF looks 30-35% range to me with moderate RV dilation/moderate dysfunction. This may not be significantly different from 9/22 cMRI. Ischemic cardiomyopathy. He has refused ICD in the past. Off GDMT due to septic shock with hypotension and AKI. Creatinine  trending down at 1.16 today, SBP 90s. Tolerated 4 drug regimen of GDMT prior to admission. He is not markedly volume overloaded.  - hold off on lasix for today as restarting Farxiga, volume appears stable.  - Restart spironolactone 12.5 daily.  - Restart Farxiga 10 mg daily  - Resume other GDMT as BP and renal function allow. (Was on Entresto 97/103 and coreg 3.125 mg BID) 3. AKI: Baseline Scr 1.2-1.5, up to 3.27 on admit in setting of shock. Creatinine trending down, 1.16 currently.  4. CAD: S/p CABG in 2020. No chest pain.  - Continue aspirin and statin 5. Left lower extremity cellulitis: With associated septic shock, now resolved.  - Abx narrowed to cephalexin.  6. PVCs - 5-20s today on tele - K 3.9 - waiting to add BB, BP soft - snores, needs OP sleep study  Length of Stay: 4  Alen Bleacher, NP  07/29/2022, 9:26 AM  Advanced Heart Failure Team Pager [phone] (M-F; 7a - 5p)  Please contact [facility] for night-coverage after hours (5p -7a ) and weekends on [email or web address]   Agree with the above NP note.   Creatinine trending down, 1.16 today.  SBP around 100.   General: NAD Neck: No JVD, no thyromegaly or thyroid nodule.  Lungs: Clear to auscultation bilaterally with normal respiratory effort. CV: Nondisplaced PMI.  Heart regular S1/S2, no S3/S4, no murmur.  No peripheral edema.   Abdomen: Soft, nontender, no hepatosplenomegaly, no distention.  Skin: Intact without lesions or rashes.  Neurologic: Alert and oriented x 3.  Psych: Normal affect. Extremities: No clubbing or cyanosis.  HEENT: Normal.   He is recovering from septic shock, BP improving.  Restart spironolactone 12.5 daily today and Farxiga 10 daily.    Follow BP, hopefully can restart Coreg tomorrow at low dose then low dose Entresto the next day.    Mobilize.   Marca Ancona 07/29/2022

## 2022-07-29 NOTE — Progress Notes (Signed)
PROGRESS NOTE                                                                                                                                                                                                             Patient Demographics:    Frank Love, is a 67 y.o. male, DOB - 06/25/1955, HYQ:[id number]  Outpatient Primary MD for the patient is Claiborne Rigg, NP    LOS - 4  Admit date - 07/25/2022    Chief Complaint  Patient presents with   Hypotension       Brief Narrative (HPI from H&P)   67 y.o. 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 He presented to [facility] ED 7/1 with generalized weakness and feeling poorly x 3 - 4 days.  His workup was consistent with left lower extremity cellulitis, early sepsis with toxic encephalopathy.  He was admitted by ICU team and transferred to my care on 07/29/2022 on day 4 of his hospital stay.   Subjective:    Keeland [place] today has, No headache, No chest pain, No abdominal pain - No Nausea, No new weakness tingling or numbness, no SOB   Assessment  & Plan :    Septic shock secondary to left lower extremity cellulitis.  Septic shock has resolved, sepsis pathophysiology has resolved, he chronically runs low blood pressures with systolic in the low 100s, he is stable, blood cultures negative, initially treated in ICU and required pressors, has finished IV antibiotics and now on oral Keflex.  MRSA nasal PCR negative.  Continue to monitor.  Lower extremity venous duplex negative, clinically improving and now symptom-free.  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  Cardiology team on board, appears compensated.  Dyslipidemia.  On statin.  Chronic hypotension.  Asymptomatic and stable.  Has SBPs in the low 100s.  AKI.  Due to sepsis.  Resolved.        Condition -  Fair  Family Communication  :  None  Code Status : Full  Consults  :  PCCM, Cards  PUD Prophylaxis :    Procedures  :     TTE - 1. LV not well seen abnormal septal motion ? moderate reduction in EF. Left ventricular ejection  fraction, by estimation, is unable to estimate%. The left ventricle has normal function. Left ventricular endocardial border not optimally defined to evaluate regional wall motion. Left ventricular diastolic parameters are indeterminate.  2. Not well seen but appears severely dilated with severe reduction in fucntion . Right ventricular systolic function was not well visualized. The right ventricular size is normal.  3. The mitral valve is abnormal. No evidence of mitral valve regurgitation. No evidence of mitral stenosis. Moderate mitral annular calcification.  4. The aortic valve is tricuspid. There is mild calcification of the aortic valve. Aortic valve regurgitation is not visualized. Aortic valve sclerosis is present, with no evidence of aortic valve stenosis  Bilateral lower extremity venous duplex.  No DVT.    Renal ultrasound.  Nonacute.      Disposition Plan  :    Status is: Inpatient  DVT Prophylaxis  :    heparin injection 5,000 Units Start: 07/25/22 2200 SCDs Start: 07/25/22 2134     Lab Results  Component Value Date   PLT 177 07/29/2022    Diet :  Diet Order             Diet Heart Room service appropriate? Yes; Fluid consistency: Thin  Diet effective now                    Inpatient Medications  Scheduled Meds:  aspirin  81 mg Oral Daily   atorvastatin  40 mg Oral q1800   cephALEXin  500 mg Oral Q6H   Chlorhexidine Gluconate Cloth  6 each Topical Q0600   dapagliflozin propanediol  10 mg Oral Daily   [START ON 07/30/2022] furosemide  20 mg Oral Daily   heparin  5,000 Units Subcutaneous Q8H   melatonin  3 mg Oral QHS   potassium chloride  40 mEq Oral Once   spironolactone  12.5 mg Oral Daily   Continuous Infusions: PRN  Meds:.acetaminophen, docusate sodium, polyethylene glycol    Objective:   Vitals:   07/28/22 2036 07/29/22 0020 07/29/22 0500 07/29/22 0625  BP: (!) 107/58 (!) 91/51  114/63  Pulse: 61 69  68  Resp: 18 18  16   Temp: 98.2 F (36.8 C) 98.2 F (36.8 C)  98 F (36.7 C)  TempSrc: Oral Oral  Oral  SpO2: 94% 94%  94%  Weight:   133.6 kg   Height:        Wt Readings from Last 3 Encounters:  07/29/22 133.6 kg  07/25/22 131.8 kg  06/15/22 135.4 kg     Intake/Output Summary (Last 24 hours) at 07/29/2022 1030 Last data filed at 07/28/2022 1300 Gross per 24 hour  Intake 14782 ml  Output --  Net 12290 ml     Physical Exam  Awake Alert, No new F.N deficits, Normal affect Frank Love,Frank Love Supple Neck, No JVD,   Symmetrical Chest wall movement, Good air movement bilaterally, CTAB RRR,No Gallops,Rubs or new Murmurs,  +ve B.Sounds, Abd Soft, No tenderness,   L.leg swollen and red, mild warmth.    Data Review:    Recent Labs  Lab 07/25/22 1720 07/26/22 0420 07/27/22 1346 07/28/22 0204 07/29/22 0426  WBC 9.7 10.3 6.4 6.4 6.2  HGB 13.8 13.4 12.6* 11.2* 11.0*  HCT 43.5 38.5* 38.6* 34.1* 34.5*  PLT 140* 135* 146* 158 177  MCV 87.9 82.8 86.0 85.7 87.8  MCH 27.9 28.8 28.1 28.1 28.0  MCHC 31.7 34.8 32.6 32.8 31.9  RDW 14.3 14.3 14.6 14.6 14.8  LYMPHSABS 0.5*  --   --   --   --  MONOABS 0.6  --   --   --   --   EOSABS 0.2  --   --   --   --   BASOSABS 0.0  --   --   --   --     Recent Labs  Lab 07/25/22 1720 07/25/22 1842 07/26/22 0156 07/26/22 0937 07/27/22 1346 07/28/22 0204 07/29/22 0426 07/29/22 0756  NA 132*  --  136 137 140 137 135  --   K 3.9  --  3.0* 3.6 3.2* 3.6 3.9  --   CL 100  --  103 109 107 109 107  --   CO2 16*  --  15* 16* 20* 20* 20*  --   ANIONGAP 16*  --  18* 12 13 8 8   --   GLUCOSE 139*  --  142* 168* 112* 113* 111*  --   BUN 102*  --  96* 82* 53* 43* 35*  --   CREATININE 3.27*  --  2.97* 2.51* 1.55* 1.40* 1.16  --   AST 44*  --   --   --  29   --   --   --   ALT 42  --   --   --  30  --   --   --   ALKPHOS 76  --   --   --  77  --   --   --   BILITOT 1.4*  --   --   --  0.9  --   --   --   ALBUMIN 2.9*  --   --   --  2.3*  --   --   --   CRP  --   --   --   --   --   --   --  9.3*  PROCALCITON  --   --   --   --   --   --   --  0.21  LATICACIDVEN 1.9 1.9  --   --   --   --   --   --   INR  --   --  1.2  --   --   --   --   --   TSH  --   --   --   --   --   --   --  1.336  BNP  --   --   --   --   --   --   --  613.4*  MG  --   --  2.6*  --   --  2.4 2.3  --   CALCIUM 8.0*  --  7.6* 7.9* 8.2* 7.8* 7.6*  --     Lab Results  Component Value Date   HGBA1C 6.2 (H) 03/08/2022    Recent Labs    07/29/22 0756  TSH 1.336     Micro Results Recent Results (from the past 240 hour(s))  Culture, blood (Routine x 2)     Status: None (Preliminary result)   Collection Time: 07/25/22  5:12 PM   Specimen: BLOOD LEFT HAND  Result Value Ref Range Status   Specimen Description BLOOD LEFT HAND  Final   Special Requests   Final    BOTTLES DRAWN AEROBIC AND ANAEROBIC Blood Culture adequate volume   Culture   Final    NO GROWTH 4 DAYS Performed at [facility], [address] [place], [place] [address]    Report Status  PENDING  Incomplete  Culture, blood (Routine x 2)     Status: None (Preliminary result)   Collection Time: 07/25/22  5:20 PM   Specimen: BLOOD RIGHT HAND  Result Value Ref Range Status   Specimen Description BLOOD RIGHT HAND  Final   Special Requests   Final    BOTTLES DRAWN AEROBIC AND ANAEROBIC Blood Culture results may not be optimal due to an inadequate volume of blood received in culture bottles   Culture   Final    NO GROWTH 4 DAYS Performed at [facility], [address] [place], [place] [address]    Report Status PENDING  Incomplete  MRSA Next Gen by PCR, Nasal     Status: None   Collection Time: 07/25/22 10:35 PM   Specimen: Nasal Mucosa; Nasal Swab  Result Value Ref Range Status   MRSA  by PCR Next Gen NOT DETECTED NOT DETECTED Final    Comment: (NOTE) The GeneXpert MRSA Assay (FDA approved for NASAL specimens only), is one component of a comprehensive MRSA colonization surveillance program. It is not intended to diagnose MRSA infection nor to guide or monitor treatment for MRSA infections. Test performance is not FDA approved in patients less than 38 years old. Performed at [facility], [address] [place], [place] [address]   MRSA Next Gen by PCR, Nasal     Status: None   Collection Time: 07/29/22  5:55 AM   Specimen: Nasal Mucosa; Nasal Swab  Result Value Ref Range Status   MRSA by PCR Next Gen NOT DETECTED NOT DETECTED Final    Comment: (NOTE) The GeneXpert MRSA Assay (FDA approved for NASAL specimens only), is one component of a comprehensive MRSA colonization surveillance program. It is not intended to diagnose MRSA infection nor to guide or monitor treatment for MRSA infections. Test performance is not FDA approved in patients less than 73 years old. Performed at [facility], [address], [place], [place] [address]     Radiology Reports ECHOCARDIOGRAM COMPLETE  Result Date: 07/26/2022    ECHOCARDIOGRAM REPORT   Patient Name:   CHRISTOF FEINBERG Date of Exam: 07/26/2022 Medical Rec #:  [id number]          Height:       74.0 in Accession #:    [id number]         Weight:       296.3 lb Date of Birth:  [birth year]          BSA:          2.569 m Patient Age:    67 years           BP:           81/41 mmHg Patient Gender: M                  HR:           61 bpm. Exam Location:  Inpatient Procedure: 2D Echo, Color Doppler, Cardiac Doppler and Intracardiac            Opacification Agent Indications:    CHF  History:        Patient has prior history of Echocardiogram examinations, most                 recent 04/03/2020. CHF and Cardiomyopathy, CAD, Prior CABG; Risk                 Factors:Dyslipidemia.  Sonographer:  Milbert Coulter Referring Phys: [id number]  Tomma Lightning  Sonographer Comments: Suboptimal parasternal window, suboptimal apical window, no subcostal window and patient is obese. Image acquisition challenging due to patient body habitus. IMPRESSIONS  1. LV not well seen abnormal septal motion ? moderate reduction in EF. Left ventricular ejection fraction, by estimation, is unable to estimate%. The left ventricle has normal function. Left ventricular endocardial border not optimally defined to evaluate regional wall motion. Left ventricular diastolic parameters are indeterminate.  2. Not well seen but appears severely dilated with severe reduction in fucntion . Right ventricular systolic function was not well visualized. The right ventricular size is normal.  3. The mitral valve is abnormal. No evidence of mitral valve regurgitation. No evidence of mitral stenosis. Moderate mitral annular calcification.  4. The aortic valve is tricuspid. There is mild calcification of the aortic valve. Aortic valve regurgitation is not visualized. Aortic valve sclerosis is present, with no evidence of aortic valve stenosis. FINDINGS  Left Ventricle: LV not well seen abnormal septal motion ? moderate reduction in EF. Left ventricular ejection fraction, by estimation, is unable to estimate%. The left ventricle has normal function. Left ventricular endocardial border not optimally defined to evaluate regional wall motion. Definity contrast agent was given IV to delineate the left ventricular endocardial borders. The left ventricular internal cavity size was normal in size. There is no left ventricular hypertrophy. Left ventricular  diastolic parameters are indeterminate. Right Ventricle: Not well seen but appears severely dilated with severe reduction in fucntion. The right ventricular size is normal. No increase in right ventricular wall thickness. Right ventricular systolic function was not well visualized. Left Atrium: Left atrial size was not well visualized. Right  Atrium: Right atrial size was not well visualized. Pericardium: There is no evidence of pericardial effusion. Mitral Valve: The mitral valve is abnormal. There is moderate thickening of the mitral valve leaflet(s). There is moderate calcification of the mitral valve leaflet(s). Moderate mitral annular calcification. No evidence of mitral valve regurgitation. No evidence of mitral valve stenosis. Tricuspid Valve: The tricuspid valve is normal in structure. Tricuspid valve regurgitation is mild . No evidence of tricuspid stenosis. Aortic Valve: The aortic valve is tricuspid. There is mild calcification of the aortic valve. Aortic valve regurgitation is not visualized. Aortic valve sclerosis is present, with no evidence of aortic valve stenosis. Aortic valve mean gradient measures 3.0 mmHg. Aortic valve peak gradient measures 5.3 mmHg. Pulmonic Valve: The pulmonic valve was normal in structure. Pulmonic valve regurgitation is mild. No evidence of pulmonic stenosis. Aorta: The aortic root is normal in size and structure. Venous: The inferior vena cava was not well visualized. IAS/Shunts: The interatrial septum was not assessed.  LEFT VENTRICLE PLAX 2D LVIDd:         5.80 cm Diastology LVIDs:         4.10 cm LV e' medial:    7.62 cm/s LV PW:         1.00 cm LV E/e' medial:  7.5 LV IVS:        1.00 cm LV e' lateral:   12.10 cm/s                        LV E/e' lateral: 4.7  RIGHT VENTRICLE RV S prime:     10.00 cm/s TAPSE (M-mode): 1.2 cm LEFT ATRIUM         Index LA diam:    4.80 cm 1.87 cm/m  AORTIC VALVE AV Vmax:  115.00 cm/s AV Vmean:          82.900 cm/s AV VTI:            0.247 m AV Peak Grad:      5.3 mmHg AV Mean Grad:      3.0 mmHg LVOT Vmax:         69.80 cm/s LVOT Vmean:        51.900 cm/s LVOT VTI:          0.158 m LVOT/AV VTI ratio: 0.64  AORTA Ao Root diam: 3.20 cm Ao Asc diam:  3.30 cm MITRAL VALVE               TRICUSPID VALVE MV Area (PHT): 2.93 cm    TR Peak grad:   23.6 mmHg MV Decel Time: 259  msec    TR Vmax:        243.00 cm/s MV E velocity: 57.40 cm/s MV A velocity: 57.40 cm/s  SHUNTS MV E/A ratio:  1.00        Systemic VTI: 0.16 m Charlton Haws MD Electronically signed by Charlton Haws MD Signature Date/Time: 07/26/2022/3:54:15 PM    Final    VAS [place] LOWER EXTREMITY VENOUS (DVT) (7a-7p)  Result Date: 07/26/2022  Lower Venous DVT Study Patient Name:  QUANTAY TRBOVICH  Date of Exam:   07/25/2022 Medical Rec #: [id number]           Accession #:    [id number] Date of Birth: November 26, 1955           Patient Gender: M Patient Age:   68 years Exam Location:  [facility] Procedure:      VAS [place] LOWER EXTREMITY VENOUS (DVT) Referring Phys: Alvino Blood --------------------------------------------------------------------------------  Indications: Swelling.  Limitations: Body habitus and poor ultrasound/tissue interface. Comparison Study: Previous study 03/09/22 negative. Performing Technologist: McKayla Maag RVT, [place]  Examination Guidelines: A complete evaluation includes B-mode imaging, spectral Doppler, color Doppler, and power Doppler as needed of all accessible portions of each vessel. Bilateral testing is considered an integral part of a complete examination. Limited examinations for reoccurring indications may be performed as noted. The reflux portion of the exam is performed with the patient in reverse Trendelenburg.  +-----+---------------+---------+-----------+----------+--------------+ RIGHTCompressibilityPhasicitySpontaneityPropertiesThrombus Aging +-----+---------------+---------+-----------+----------+--------------+ CFV  Full           Yes      Yes                                 +-----+---------------+---------+-----------+----------+--------------+ SFJ  Full                                                        +-----+---------------+---------+-----------+----------+--------------+   +---------+---------------+---------+-----------+----------+-------------------+ LEFT      CompressibilityPhasicitySpontaneityPropertiesThrombus Aging      +---------+---------------+---------+-----------+----------+-------------------+ CFV      Full           Yes      Yes                                      +---------+---------------+---------+-----------+----------+-------------------+ SFJ      Full                                                             +---------+---------------+---------+-----------+----------+-------------------+  FV Prox  Full                                                             +---------+---------------+---------+-----------+----------+-------------------+ FV Mid   Full                                                             +---------+---------------+---------+-----------+----------+-------------------+ FV DistalFull                                                             +---------+---------------+---------+-----------+----------+-------------------+ PFV      Full                                                             +---------+---------------+---------+-----------+----------+-------------------+ POP      Full           Yes      Yes                                      +---------+---------------+---------+-----------+----------+-------------------+ PTV      Full                                         Not well visualized +---------+---------------+---------+-----------+----------+-------------------+ PERO     Full                                         Not well visualized +---------+---------------+---------+-----------+----------+-------------------+     Summary: RIGHT: - No evidence of common femoral vein obstruction.  LEFT: - There is no evidence of deep vein thrombosis in the lower extremity. However, portions of this examination were limited- see technologist comments above.  - No cystic structure found in the popliteal fossa.  *See table(s) above for measurements and  observations. Electronically signed by Waverly Ferrari MD on 07/26/2022 at 8:33:52 AM.    Final    DG Chest Port 1 View  Result Date: 07/26/2022 CLINICAL DATA:  Respiratory failure EXAM: PORTABLE CHEST 1 VIEW COMPARISON:  07/25/2022 FINDINGS: Chronically enlarged heart with stable mediastinal contours. CABG. Mild atelectasis or scarring in the lower lungs. There is no edema, consolidation, effusion, or pneumothorax. IMPRESSION: No active disease. Electronically Signed   By: Tiburcio Pea M.D.   On: 07/26/2022 06:04   US RENAL  Result Date: 07/25/2022 CLINICAL DATA:  Acute renal injury EXAM: RENAL / URINARY TRACT ULTRASOUND COMPLETE COMPARISON:  None Available. FINDINGS: Right Kidney: Renal measurements: 10.6 x 5.5  x 4.8 cm. = volume: 148 mL. Cortical thinning is noted. No mass or hydronephrosis is noted. Left Kidney: Renal measurements: 12.2 x 6.0 x 5.8 cm. = volume: 222 mL. Cortical thinning is noted. No mass or hydronephrosis is noted. Bladder: Appears normal for degree of bladder distention. Other: None. IMPRESSION: Cortical thinning bilaterally.  No obstructive changes are noted. Electronically Signed   By: Alcide Clever M.D.   On: 07/25/2022 22:26   DG Chest Portable 1 View  Result Date: 07/25/2022 CLINICAL DATA:  hypotension EXAM: PORTABLE CHEST 1 VIEW COMPARISON:  Chest x-ray December 13, 2019. FINDINGS: Left basilar opacities. Possible small left pleural effusion. No visible pneumothorax. Enlarged cardiac silhouette. Median sternotomy and CABG. IMPRESSION: Left basilar opacities which could represent atelectasis, aspiration, and/or pneumonia. Possible small left pleural effusion. Dedicated PA and lateral radiographs could better assess if clinically warranted. Electronically Signed   By: Feliberto Harts M.D.   On: 07/25/2022 18:26      Signature  -   Susa Raring M.D on 07/29/2022 at 10:30 AM   -  To page go to [email or web address]

## 2022-07-30 ENCOUNTER — Other Ambulatory Visit (HOSPITAL_COMMUNITY): Payer: Self-pay

## 2022-07-30 LAB — BASIC METABOLIC PANEL
Anion gap: 7 (ref 5–15)
BUN: 32 mg/dL — ABNORMAL HIGH (ref 8–23)
CO2: 22 mmol/L (ref 22–32)
Calcium: 7.8 mg/dL — ABNORMAL LOW (ref 8.9–10.3)
Chloride: 108 mmol/L (ref 98–111)
Creatinine, Ser: 1.05 mg/dL (ref 0.61–1.24)
GFR, Estimated: >60 mL/min (ref >60)
Glucose, Bld: 110 mg/dL — ABNORMAL HIGH (ref 70–99)
Potassium: 4.3 mmol/L (ref 3.5–5.1)
Sodium: 137 mmol/L (ref 135–145)

## 2022-07-30 LAB — CBC WITH DIFFERENTIAL/PLATELET
Abs Immature Granulocytes: 0.27 10*3/uL — ABNORMAL HIGH (ref 0.00–0.07)
Basophils Absolute: 0 10*3/uL (ref 0.0–0.1)
Basophils Relative: 1 %
Eosinophils Absolute: 0.4 10*3/uL (ref 0.0–0.5)
Eosinophils Relative: 6 %
HCT: 36 % — ABNORMAL LOW (ref 39.0–52.0)
Hemoglobin: 11.5 g/dL — ABNORMAL LOW (ref 13.0–17.0)
Immature Granulocytes: 4 %
Lymphocytes Relative: 13 %
Lymphs Abs: 0.8 10*3/uL (ref 0.7–4.0)
MCH: 27.9 pg (ref 26.0–34.0)
MCHC: 31.9 g/dL (ref 30.0–36.0)
MCV: 87.4 fL (ref 80.0–100.0)
Monocytes Absolute: 0.4 10*3/uL (ref 0.1–1.0)
Monocytes Relative: 6 %
Neutro Abs: 4.7 10*3/uL (ref 1.7–7.7)
Neutrophils Relative %: 70 %
Platelets: 207 10*3/uL (ref 150–400)
RBC: 4.12 MIL/uL — ABNORMAL LOW (ref 4.22–5.81)
RDW: 14.8 % (ref 11.5–15.5)
WBC: 6.6 10*3/uL (ref 4.0–10.5)
nRBC: 0 % (ref 0.0–0.2)

## 2022-07-30 LAB — CULTURE, BLOOD (ROUTINE X 2)
Culture: NO GROWTH
Culture: NO GROWTH
Special Requests: ADEQUATE

## 2022-07-30 LAB — BRAIN NATRIURETIC PEPTIDE: B Natriuretic Peptide: 492.9 pg/mL — ABNORMAL HIGH (ref 0.0–100.0)

## 2022-07-30 LAB — C-REACTIVE PROTEIN: CRP: 7.5 mg/dL — ABNORMAL HIGH (ref <1.0)

## 2022-07-30 LAB — MAGNESIUM: Magnesium: 2.2 mg/dL (ref 1.7–2.4)

## 2022-07-30 LAB — PROCALCITONIN: Procalcitonin: 0.19 ng/mL

## 2022-07-30 MED ORDER — CARVEDILOL 3.125 MG PO TABS: 3.1250 mg | ORAL_TABLET | Freq: Two times a day (BID) | ORAL | 8 refills | Status: DC

## 2022-07-30 MED ORDER — CEPHALEXIN 500 MG PO CAPS
500.0000 mg | ORAL_CAPSULE | Freq: Four times a day (QID) | ORAL | 0 refills | Status: DC
  Filled 2022-07-30: qty 28, 7d supply, fill #0

## 2022-07-30 NOTE — Discharge Instructions (Addendum)
Resume your Coreg in 2 days, resume Entresto once your PCP checks your blood pressure and clears you to do so.    Keep your legs propped up when you are sitting  Follow with Primary MD Claiborne Rigg, NP in 7 days   Get CBC, CMP, Magnesium, 2 view Chest X ray -  checked next visit with your primary MD   Activity: As tolerated with Full fall precautions use walker/cane & assistance as needed  Disposition Home    Diet: Heart Healthy   Check your Weight same time everyday, if you gain over 2 pounds, or you develop in leg swelling, experience more shortness of breath or chest pain, call your Primary MD immediately. Follow Cardiac Low Salt Diet and 1.5 lit/day fluid restriction.  Special Instructions: If you have smoked or chewed Tobacco  in the last 2 yrs please stop smoking, stop any regular Alcohol  and or any Recreational drug use.  On your next visit with your primary care physician please Get Medicines reviewed and adjusted.  Please request your Prim.MD to go over all Hospital Tests and Procedure/Radiological results at the follow up, please get all Hospital records sent to your Prim MD by signing hospital release before you go home.  If you experience worsening of your admission symptoms, develop shortness of breath, life threatening emergency, suicidal or homicidal thoughts you must seek medical attention immediately by calling 911 or calling your MD immediately  if symptoms less severe.  You Must read complete instructions/literature along with all the possible adverse reactions/side effects for all the Medicines you take and that have been prescribed to you. Take any new Medicines after you have completely understood and accpet all the possible adverse reactions/side effects.

## 2022-07-30 NOTE — Progress Notes (Signed)
Nsg Discharge Note  Admit Date:  07/25/2022 Discharge date: 07/30/2022   Julieanne Manson Sperry to be D/C'd Home per MD order.  AVS completed. Patient/caregiver able to verbalize understanding.  Discharge Medication: Allergies as of 07/30/2022   No Known Allergies      Medication List     STOP taking these medications    Entresto 97-103 MG Generic drug: sacubitril-valsartan       TAKE these medications    aspirin EC 81 MG tablet Take 1 tablet (81 mg total) by mouth daily. Swallow whole. What changed:  when to take this additional instructions   atorvastatin 40 MG tablet Commonly known as: LIPITOR Take 1 tablet (40 mg total) by mouth daily at 6 PM.   carvedilol 3.125 MG tablet Commonly known as: Coreg Take 1 tablet (3.125 mg total) by mouth 2 (two) times daily with a meal. Start taking on: August 01, 2022 What changed: These instructions start on August 01, 2022. If you are unsure what to do until then, ask your doctor or other care provider.   cephALEXin 500 MG capsule Commonly known as: KEFLEX Take 1 capsule (500 mg total) by mouth every 6 (six) hours.   dapagliflozin propanediol 10 MG Tabs tablet Commonly known as: FARXIGA TAKE 1 TABLET (10 MG TOTAL) BY MOUTH DAILY BEFORE BREAKFAST.   furosemide 40 MG tablet Commonly known as: LASIX Take 1 tablet (40 mg total) by mouth daily.   nitroGLYCERIN 0.4 MG SL tablet Commonly known as: NITROSTAT Place 1 tablet (0.4 mg total) under the tongue every 5 (five) minutes as needed for chest pain. NO more than 3 pills--if you have the need to take 2 pills or more call MD   potassium chloride SA 20 MEQ tablet Commonly known as: KLOR-CON M Take 1 tablet (20 mEq total) by mouth as directed. Take 1 tablet only take when taking a extra lasix dose What changed: additional instructions   promethazine-dextromethorphan 6.25-15 MG/5ML syrup Commonly known as: PROMETHAZINE-DM Take 5 mLs by mouth 4 (four) times daily as needed for cough.    spironolactone 25 MG tablet Commonly known as: ALDACTONE Take 1 tablet (25 mg total) by mouth daily.   Vitamin D (Ergocalciferol) 1.25 MG (50000 UNIT) Caps capsule Commonly known as: DRISDOL Take 1 capsule (50,000 Units total) by mouth every 7 (seven) days.               Durable Medical Equipment  (From admission, onward)           Start     Ordered   07/30/22 0811  For home use only DME Walker rolling  Once       Comments: 5 wheel  Question Answer Comment  Walker: With 5 Inch Wheels   Patient needs a walker to treat with the following condition Weakness      07/30/22 0811            Discharge Assessment: Vitals:   07/30/22 0400 07/30/22 0925  BP: 101/62 (!) 101/57  Pulse:  63  Resp:  20  Temp: 98.3 F (36.8 C)   SpO2:  94%   Skin clean, dry and intact without evidence of skin break down, no evidence of skin tears noted. IV catheter discontinued intact. Site without signs and symptoms of complications - no redness or edema noted at insertion site, patient denies c/o pain - only slight tenderness at site.  Dressing with slight pressure applied.  D/c Instructions-Education: Discharge instructions given to patient/family with verbalized understanding.  D/c education completed with patient/family including follow up instructions, medication list, d/c activities limitations if indicated, with other d/c instructions as indicated by MD - patient able to verbalize understanding, all questions fully answered. Patient instructed to return to ED, call 911, or call MD for any changes in condition.  Patient escorted via WC, and D/C home via private auto.  Kizzie Bane, RN 07/30/2022 9:50 AM

## 2022-07-30 NOTE — Discharge Summary (Signed)
Frank Hauptmann Deroo Love:[id number] DOB: 02-27-1955 DOA: 07/25/2022  PCP: Claiborne Rigg, NP  Admit date: 07/25/2022  Discharge date: 07/30/2022  Admitted From: Home   Disposition:  Home   Recommendations for Outpatient Follow-up:   Follow up with PCP in 1-2 weeks  PCP Please obtain BMP/CBC, 2 view CXR in 1week,  (see Discharge instructions)   PCP Please follow up on the following pending results: Better blood pressure, BMP and magnesium levels closely.  Monitor left lower extremity cellulitis clinically.   Home Health: None   Equipment/Devices: None  Consultations: PCCM, Cards Discharge Condition: Stable    CODE STATUS: Full    Diet Recommendation: Heart Healthy strict 1.5 L fluid restriction per day.    Chief Complaint  Patient presents with   Hypotension     Brief history of present illness from the day of admission and additional interim summary     67 y.o. 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 He presented to [facility] ED 7/1 with generalized weakness and feeling poorly x 3 - 4 days.  His workup was consistent with left lower extremity cellulitis, early sepsis with toxic encephalopathy.  He was admitted by ICU team and transferred to my care on 07/29/2022 on day 4 of his hospital stay.                                                                  Hospital Course   Septic shock secondary to left lower extremity cellulitis.  Septic shock has resolved, sepsis pathophysiology has resolved, he chronically runs low blood pressures with systolic in the low 100s, he is stable, blood cultures negative, initially treated in ICU and required pressors, has finished IV antibiotics and now on oral Keflex.  MRSA nasal PCR negative.  Continue to  monitor.  Lower extremity venous duplex negative, clinically improving and now symptom-free.  Will be discharged on 1 more week of oral Keflex, requested to keep his legs propped up when in sitting position, follow-up with PCP in a week.   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  Cardiology team on board, appears compensated.  Home diuretic regimen continued, follow-up with PCP and cardiology in the outpatient setting postdischarge.   Dyslipidemia.  On statin.   Chronic hypotension.  Asymptomatic and stable.  Has SBPs in the low 100s.  Blood pressure is lower than normal but gradually improving, resume home dose diuretics, Coreg in the next 2 days and Entresto once he sees his PCP and has been cleared to do so.   AKI.  Due to sepsis.  Resolved.    Discharge diagnosis     Principal Problem:   Septic shock (HCC)  Discharge instructions    Discharge Instructions     Discharge instructions   Complete by: As directed    Resume your Coreg in 2 days, resume Entresto once your PCP checks your blood pressure and clears you to do so.    Keep your legs propped up when you are sitting  Follow with Primary MD Claiborne Rigg, NP in 7 days   Get CBC, CMP, Magnesium, 2 view Chest X ray -  checked next visit with your primary MD   Activity: As tolerated with Full fall precautions use walker/cane & assistance as needed  Disposition Home    Diet: Heart Healthy   Check your Weight same time everyday, if you gain over 2 pounds, or you develop in leg swelling, experience more shortness of breath or chest pain, call your Primary MD immediately. Follow Cardiac Low Salt Diet and 1.5 lit/day fluid restriction.  Special Instructions: If you have smoked or chewed Tobacco  in the last 2 yrs please stop smoking, stop any regular Alcohol  and or any Recreational drug use.  On your next visit with your primary care physician please Get Medicines reviewed and  adjusted.  Please request your Prim.MD to go over all Hospital Tests and Procedure/Radiological results at the follow up, please get all Hospital records sent to your Prim MD by signing hospital release before you go home.  If you experience worsening of your admission symptoms, develop shortness of breath, life threatening emergency, suicidal or homicidal thoughts you must seek medical attention immediately by calling 911 or calling your MD immediately  if symptoms less severe.  You Must read complete instructions/literature along with all the possible adverse reactions/side effects for all the Medicines you take and that have been prescribed to you. Take any new Medicines after you have completely understood and accpet all the possible adverse reactions/side effects.   Increase activity slowly   Complete by: As directed    No wound care   Complete by: As directed        Discharge Medications   Allergies as of 07/30/2022   No Known Allergies      Medication List     STOP taking these medications    Entresto 97-103 MG Generic drug: sacubitril-valsartan       TAKE these medications    aspirin EC 81 MG tablet Take 1 tablet (81 mg total) by mouth daily. Swallow whole. What changed:  when to take this additional instructions   atorvastatin 40 MG tablet Commonly known as: LIPITOR Take 1 tablet (40 mg total) by mouth daily at 6 PM.   carvedilol 3.125 MG tablet Commonly known as: Coreg Take 1 tablet (3.125 mg total) by mouth 2 (two) times daily with a meal. Start taking on: August 01, 2022 What changed: These instructions start on August 01, 2022. If you are unsure what to do until then, ask your doctor or other care provider.   cephALEXin 500 MG capsule Commonly known as: KEFLEX Take 1 capsule (500 mg total) by mouth every 6 (six) hours.   dapagliflozin propanediol 10 MG Tabs tablet Commonly known as: FARXIGA TAKE 1 TABLET (10 MG TOTAL) BY MOUTH DAILY BEFORE BREAKFAST.    furosemide 40 MG tablet Commonly known as: LASIX Take 1 tablet (40 mg total) by mouth daily.   nitroGLYCERIN 0.4 MG SL tablet Commonly known as: NITROSTAT Place 1 tablet (0.4 mg total) under the tongue every 5 (five) minutes as needed for chest pain. NO more than 3  pills--if you have the need to take 2 pills or more call MD   potassium chloride SA 20 MEQ tablet Commonly known as: KLOR-CON M Take 1 tablet (20 mEq total) by mouth as directed. Take 1 tablet only take when taking a extra lasix dose What changed: additional instructions   promethazine-dextromethorphan 6.25-15 MG/5ML syrup Commonly known as: PROMETHAZINE-DM Take 5 mLs by mouth 4 (four) times daily as needed for cough.   spironolactone 25 MG tablet Commonly known as: ALDACTONE Take 1 tablet (25 mg total) by mouth daily.   Vitamin D (Ergocalciferol) 1.25 MG (50000 UNIT) Caps capsule Commonly known as: DRISDOL Take 1 capsule (50,000 Units total) by mouth every 7 (seven) days.         Follow-up Information     Claiborne Rigg, NP. Schedule an appointment as soon as possible for a visit in 1 week(s).   Specialty: Nurse Practitioner Why: Also follow-up with your cardiologist within a week of discharge Contact information: [address] [place] [address] [phone]                 Major procedures and Radiology Reports - PLEASE review detailed and final reports thoroughly  -         ECHOCARDIOGRAM COMPLETE  Result Date: 07/26/2022    ECHOCARDIOGRAM REPORT   Patient Name:   SOM HUDA Date of Exam: 07/26/2022 Medical Rec #:  [id number]          Height:       74.0 in Accession #:    [id number]         Weight:       296.3 lb Date of Birth:  05/26/1955          BSA:          2.569 m Patient Age:    67 years           BP:           81/41 mmHg Patient Gender: M                  HR:           61 bpm. Exam Location:  Inpatient Procedure: 2D Echo, Color Doppler, Cardiac Doppler and Intracardiac             Opacification Agent Indications:    CHF  History:        Patient has prior history of Echocardiogram examinations, most                 recent 04/03/2020. CHF and Cardiomyopathy, CAD, Prior CABG; Risk                 Factors:Dyslipidemia.  Sonographer:    Milbert Coulter Referring Phys: [id number] Tomma Lightning  Sonographer Comments: Suboptimal parasternal window, suboptimal apical window, no subcostal window and patient is obese. Image acquisition challenging due to patient body habitus. IMPRESSIONS  1. LV not well seen abnormal septal motion ? moderate reduction in EF. Left ventricular ejection fraction, by estimation, is unable to estimate%. The left ventricle has normal function. Left ventricular endocardial border not optimally defined to evaluate regional wall motion. Left ventricular diastolic parameters are indeterminate.  2. Not well seen but appears severely dilated with severe reduction in fucntion . Right ventricular systolic function was not well visualized. The right ventricular size is normal.  3. The mitral valve is abnormal. No evidence of mitral valve regurgitation. No evidence of mitral stenosis. Moderate  mitral annular calcification.  4. The aortic valve is tricuspid. There is mild calcification of the aortic valve. Aortic valve regurgitation is not visualized. Aortic valve sclerosis is present, with no evidence of aortic valve stenosis. FINDINGS  Left Ventricle: LV not well seen abnormal septal motion ? moderate reduction in EF. Left ventricular ejection fraction, by estimation, is unable to estimate%. The left ventricle has normal function. Left ventricular endocardial border not optimally defined to evaluate regional wall motion. Definity contrast agent was given IV to delineate the left ventricular endocardial borders. The left ventricular internal cavity size was normal in size. There is no left ventricular hypertrophy. Left ventricular  diastolic parameters are indeterminate. Right  Ventricle: Not well seen but appears severely dilated with severe reduction in fucntion. The right ventricular size is normal. No increase in right ventricular wall thickness. Right ventricular systolic function was not well visualized. Left Atrium: Left atrial size was not well visualized. Right Atrium: Right atrial size was not well visualized. Pericardium: There is no evidence of pericardial effusion. Mitral Valve: The mitral valve is abnormal. There is moderate thickening of the mitral valve leaflet(s). There is moderate calcification of the mitral valve leaflet(s). Moderate mitral annular calcification. No evidence of mitral valve regurgitation. No evidence of mitral valve stenosis. Tricuspid Valve: The tricuspid valve is normal in structure. Tricuspid valve regurgitation is mild . No evidence of tricuspid stenosis. Aortic Valve: The aortic valve is tricuspid. There is mild calcification of the aortic valve. Aortic valve regurgitation is not visualized. Aortic valve sclerosis is present, with no evidence of aortic valve stenosis. Aortic valve mean gradient measures 3.0 mmHg. Aortic valve peak gradient measures 5.3 mmHg. Pulmonic Valve: The pulmonic valve was normal in structure. Pulmonic valve regurgitation is mild. No evidence of pulmonic stenosis. Aorta: The aortic root is normal in size and structure. Venous: The inferior vena cava was not well visualized. IAS/Shunts: The interatrial septum was not assessed.  LEFT VENTRICLE PLAX 2D LVIDd:         5.80 cm Diastology LVIDs:         4.10 cm LV e' medial:    7.62 cm/s LV PW:         1.00 cm LV E/e' medial:  7.5 LV IVS:        1.00 cm LV e' lateral:   12.10 cm/s                        LV E/e' lateral: 4.7  RIGHT VENTRICLE RV S prime:     10.00 cm/s TAPSE (M-mode): 1.2 cm LEFT ATRIUM         Index LA diam:    4.80 cm 1.87 cm/m  AORTIC VALVE AV Vmax:           115.00 cm/s AV Vmean:          82.900 cm/s AV VTI:            0.247 m AV Peak Grad:      5.3 mmHg AV Mean  Grad:      3.0 mmHg LVOT Vmax:         69.80 cm/s LVOT Vmean:        51.900 cm/s LVOT VTI:          0.158 m LVOT/AV VTI ratio: 0.64  AORTA Ao Root diam: 3.20 cm Ao Asc diam:  3.30 cm MITRAL VALVE  TRICUSPID VALVE MV Area (PHT): 2.93 cm    TR Peak grad:   23.6 mmHg MV Decel Time: 259 msec    TR Vmax:        243.00 cm/s MV E velocity: 57.40 cm/s MV A velocity: 57.40 cm/s  SHUNTS MV E/A ratio:  1.00        Systemic VTI: 0.16 m Charlton Haws MD Electronically signed by Charlton Haws MD Signature Date/Time: 07/26/2022/3:54:15 PM    Final    VAS [place] LOWER EXTREMITY VENOUS (DVT) (7a-7p)  Result Date: 07/26/2022  Lower Venous DVT Study Patient Name:  MELTON FINERAN  Date of Exam:   07/25/2022 Medical Rec #: [id number]           Accession #:    [id number] Date of Birth: [birth year]           Patient Gender: M Patient Age:   45 years Exam Location:  [facility] Procedure:      VAS [place] LOWER EXTREMITY VENOUS (DVT) Referring Phys: Alvino Blood --------------------------------------------------------------------------------  Indications: Swelling.  Limitations: Body habitus and poor ultrasound/tissue interface. Comparison Study: Previous study 03/09/22 negative. Performing Technologist: McKayla Maag RVT, [place]  Examination Guidelines: A complete evaluation includes B-mode imaging, spectral Doppler, color Doppler, and power Doppler as needed of all accessible portions of each vessel. Bilateral testing is considered an integral part of a complete examination. Limited examinations for reoccurring indications may be performed as noted. The reflux portion of the exam is performed with the patient in reverse Trendelenburg.  +-----+---------------+---------+-----------+----------+--------------+ RIGHTCompressibilityPhasicitySpontaneityPropertiesThrombus Aging +-----+---------------+---------+-----------+----------+--------------+ CFV  Full           Yes      Yes                                  +-----+---------------+---------+-----------+----------+--------------+ SFJ  Full                                                        +-----+---------------+---------+-----------+----------+--------------+   +---------+---------------+---------+-----------+----------+-------------------+ LEFT     CompressibilityPhasicitySpontaneityPropertiesThrombus Aging      +---------+---------------+---------+-----------+----------+-------------------+ CFV      Full           Yes      Yes                                      +---------+---------------+---------+-----------+----------+-------------------+ SFJ      Full                                                             +---------+---------------+---------+-----------+----------+-------------------+ FV Prox  Full                                                             +---------+---------------+---------+-----------+----------+-------------------+ FV Mid   Full                                                             +---------+---------------+---------+-----------+----------+-------------------+  FV DistalFull                                                             +---------+---------------+---------+-----------+----------+-------------------+ PFV      Full                                                             +---------+---------------+---------+-----------+----------+-------------------+ POP      Full           Yes      Yes                                      +---------+---------------+---------+-----------+----------+-------------------+ PTV      Full                                         Not well visualized +---------+---------------+---------+-----------+----------+-------------------+ PERO     Full                                         Not well visualized +---------+---------------+---------+-----------+----------+-------------------+     Summary: RIGHT: - No  evidence of common femoral vein obstruction.  LEFT: - There is no evidence of deep vein thrombosis in the lower extremity. However, portions of this examination were limited- see technologist comments above.  - No cystic structure found in the popliteal fossa.  *See table(s) above for measurements and observations. Electronically signed by Waverly Ferrari MD on 07/26/2022 at 8:33:52 AM.    Final    DG Chest Port 1 View  Result Date: 07/26/2022 CLINICAL DATA:  Respiratory failure EXAM: PORTABLE CHEST 1 VIEW COMPARISON:  07/25/2022 FINDINGS: Chronically enlarged heart with stable mediastinal contours. CABG. Mild atelectasis or scarring in the lower lungs. There is no edema, consolidation, effusion, or pneumothorax. IMPRESSION: No active disease. Electronically Signed   By: Tiburcio Pea M.D.   On: 07/26/2022 06:04   US RENAL  Result Date: 07/25/2022 CLINICAL DATA:  Acute renal injury EXAM: RENAL / URINARY TRACT ULTRASOUND COMPLETE COMPARISON:  None Available. FINDINGS: Right Kidney: Renal measurements: 10.6 x 5.5 x 4.8 cm. = volume: 148 mL. Cortical thinning is noted. No mass or hydronephrosis is noted. Left Kidney: Renal measurements: 12.2 x 6.0 x 5.8 cm. = volume: 222 mL. Cortical thinning is noted. No mass or hydronephrosis is noted. Bladder: Appears normal for degree of bladder distention. Other: None. IMPRESSION: Cortical thinning bilaterally.  No obstructive changes are noted. Electronically Signed   By: Alcide Clever M.D.   On: 07/25/2022 22:26   DG Chest Portable 1 View  Result Date: 07/25/2022 CLINICAL DATA:  hypotension EXAM: PORTABLE CHEST 1 VIEW COMPARISON:  Chest x-ray December 13, 2019. FINDINGS: Left basilar opacities. Possible small left pleural effusion. No visible pneumothorax. Enlarged cardiac silhouette. Median sternotomy and CABG. IMPRESSION: Left basilar opacities which could represent atelectasis, aspiration, and/or  pneumonia. Possible small left pleural effusion. Dedicated PA and  lateral radiographs could better assess if clinically warranted. Electronically Signed   By: Feliberto Harts M.D.   On: 07/25/2022 18:26    Micro Results    Recent Results (from the past 240 hour(s))  Culture, blood (Routine x 2)     Status: None   Collection Time: 07/25/22  5:12 PM   Specimen: BLOOD LEFT HAND  Result Value Ref Range Status   Specimen Description BLOOD LEFT HAND  Final   Special Requests   Final    BOTTLES DRAWN AEROBIC AND ANAEROBIC Blood Culture adequate volume   Culture   Final    NO GROWTH 5 DAYS Performed at [facility], [address] [place], [place] [address]    Report Status 07/30/2022 FINAL  Final  Culture, blood (Routine x 2)     Status: None   Collection Time: 07/25/22  5:20 PM   Specimen: BLOOD RIGHT HAND  Result Value Ref Range Status   Specimen Description BLOOD RIGHT HAND  Final   Special Requests   Final    BOTTLES DRAWN AEROBIC AND ANAEROBIC Blood Culture results may not be optimal due to an inadequate volume of blood received in culture bottles   Culture   Final    NO GROWTH 5 DAYS Performed at [facility], [address] [place], [place] [address]    Report Status 07/30/2022 FINAL  Final  MRSA Next Gen by PCR, Nasal     Status: None   Collection Time: 07/25/22 10:35 PM   Specimen: Nasal Mucosa; Nasal Swab  Result Value Ref Range Status   MRSA by PCR Next Gen NOT DETECTED NOT DETECTED Final    Comment: (NOTE) The GeneXpert MRSA Assay (FDA approved for NASAL specimens only), is one component of a comprehensive MRSA colonization surveillance program. It is not intended to diagnose MRSA infection nor to guide or monitor treatment for MRSA infections. Test performance is not FDA approved in patients less than 72 years old. Performed at [facility], [address], [place], [place] [address]   MRSA Next Gen by PCR, Nasal     Status: None   Collection Time: 07/29/22  5:55 AM   Specimen: Nasal Mucosa; Nasal Swab   Result Value Ref Range Status   MRSA by PCR Next Gen NOT DETECTED NOT DETECTED Final    Comment: (NOTE) The GeneXpert MRSA Assay (FDA approved for NASAL specimens only), is one component of a comprehensive MRSA colonization surveillance program. It is not intended to diagnose MRSA infection nor to guide or monitor treatment for MRSA infections. Test performance is not FDA approved in patients less than 45 years old. Performed at [facility], [address], [place], [place] [address]     Today   Subjective    Batu Kittles today has no headache,no chest abdominal pain,no new weakness tingling or numbness, feels much better wants to go home today.    Objective   Blood pressure 101/62, pulse 66, temperature 98.3 F (36.8 C), temperature source Oral, resp. rate 20, height 6\' 2"  (1.88 m), weight (!) 137 kg, SpO2 91 %.   Intake/Output Summary (Last 24 hours) at 07/30/2022 0808 Last data filed at 07/30/2022 0700 Gross per 24 hour  Intake 960 ml  Output --  Net 960 ml    Exam  Awake Alert, No new F.N deficits,    [place].AT,PERRAL Supple Neck,   Symmetrical Chest wall  movement, Good air movement bilaterally, CTAB RRR,No Gallops,   +ve B.Sounds, Abd Soft, Non tender,  Left lower extremity cellulitis improving   Data Review   Recent Labs  Lab 07/25/22 1720 07/26/22 0420 07/27/22 1346 07/28/22 0204 07/29/22 0426 07/30/22 0300  WBC 9.7 10.3 6.4 6.4 6.2 6.6  HGB 13.8 13.4 12.6* 11.2* 11.0* 11.5*  HCT 43.5 38.5* 38.6* 34.1* 34.5* 36.0*  PLT 140* 135* 146* 158 177 207  MCV 87.9 82.8 86.0 85.7 87.8 87.4  MCH 27.9 28.8 28.1 28.1 28.0 27.9  MCHC 31.7 34.8 32.6 32.8 31.9 31.9  RDW 14.3 14.3 14.6 14.6 14.8 14.8  LYMPHSABS 0.5*  --   --   --   --  0.8  MONOABS 0.6  --   --   --   --  0.4  EOSABS 0.2  --   --   --   --  0.4  BASOSABS 0.0  --   --   --   --  0.0    Recent Labs  Lab 07/25/22 1720 07/25/22 1842 07/26/22 0156 07/26/22 0937 07/27/22 1346  07/28/22 0204 07/29/22 0426 07/29/22 0756 07/30/22 0300  NA 132*  --  136 137 140 137 135  --  137  K 3.9  --  3.0* 3.6 3.2* 3.6 3.9  --  4.3  CL 100  --  103 109 107 109 107  --  108  CO2 16*  --  15* 16* 20* 20* 20*  --  22  ANIONGAP 16*  --  18* 12 13 8 8   --  7  GLUCOSE 139*  --  142* 168* 112* 113* 111*  --  110*  BUN 102*  --  96* 82* 53* 43* 35*  --  32*  CREATININE 3.27*  --  2.97* 2.51* 1.55* 1.40* 1.16  --  1.05  AST 44*  --   --   --  29  --   --   --   --   ALT 42  --   --   --  30  --   --   --   --   ALKPHOS 76  --   --   --  77  --   --   --   --   BILITOT 1.4*  --   --   --  0.9  --   --   --   --   ALBUMIN 2.9*  --   --   --  2.3*  --   --   --   --   CRP  --   --   --   --   --   --   --  9.3* 7.5*  PROCALCITON  --   --   --   --   --   --   --  0.21 0.19  LATICACIDVEN 1.9 1.9  --   --   --   --   --   --   --   INR  --   --  1.2  --   --   --   --   --   --   TSH  --   --   --   --   --   --   --  1.336  --   BNP  --   --   --   --   --   --   --  613.4* 492.9*  MG  --   --  2.6*  --   --  2.4 2.3  --  2.2  CALCIUM 8.0*  --  7.6* 7.9* 8.2* 7.8* 7.6*  --  7.8*    Total Time in preparing paper work, data evaluation and todays exam - 35 minutes  Signature  -    Susa Raring M.D on 07/30/2022 at 8:08 AM   -  To page go to [email or web address]

## 2022-08-01 ENCOUNTER — Telehealth: Payer: Self-pay

## 2022-08-01 NOTE — Transitions of Care (Post Inpatient/ED Visit) (Signed)
08/01/2022  Name: Frank Love MRN: [id number] DOB: 12-11-1955  Today's TOC FU Call Status: Today's TOC FU Call Status:: Successful TOC FU Call Competed TOC FU Call Complete Date: 08/01/22  Transition Care Management Follow-up Telephone Call Date of Discharge: 07/30/22 Discharge Facility: [facility]) Type of Discharge: Inpatient Admission Primary Inpatient Discharge Diagnosis:: Septic shock How have you been since you were released from the hospital?: Better Any questions or concerns?: No  Items Reviewed: Did you receive and understand the discharge instructions provided?: Yes Medications obtained,verified, and reconciled?: Yes (Medications Reviewed) Any new allergies since your discharge?: No Dietary orders reviewed?: Yes Type of Diet Ordered:: HEARTH HEALTHY STRICT 1.5 FLUID RESTRICTION PER DAY Do you have support at home?: Yes People in Home: spouse Name of Support/Comfort Primary Source: (Baylock,Sylvia) WIFE  Medications Reviewed Today: Medications Reviewed Today     Reviewed by Vic Blackbird, RN (Registered Nurse) on 08/01/22 at 1103  Med List Status: <None>   Medication Order Taking? Sig Documenting Provider Last Dose Status Informant  aspirin EC 81 MG tablet [id number] No Take 1 tablet (81 mg total) by mouth daily. Swallow whole.  Patient taking differently: Take 81 mg by mouth at bedtime.   Laurey Morale, MD Past Week Active Self, Pharmacy Records  atorvastatin (LIPITOR) 40 MG tablet [id number] No Take 1 tablet (40 mg total) by mouth daily at 6 PM. Clawson, Anderson Malta, FNP Past Week Active Self, Pharmacy Records  carvedilol (COREG) 3.125 MG tablet [id number]  Take 1 tablet (3.125 mg total) by mouth 2 (two) times daily with a meal. Leroy Sea, MD  Active   cephALEXin (KEFLEX) 500 MG capsule [id number]  Take 1 capsule (500 mg total) by mouth every 6 (six) hours. Leroy Sea, MD  Active   dapagliflozin propanediol (FARXIGA) 10 MG TABS tablet  [id number] No TAKE 1 TABLET (10 MG TOTAL) BY MOUTH DAILY BEFORE BREAKFAST. Laurey Morale, MD Past Week Active Self, Pharmacy Records  furosemide (LASIX) 40 MG tablet [id number] No Take 1 tablet (40 mg total) by mouth daily. Laurey Morale, MD Past Week Active Self, Pharmacy Records  nitroGLYCERIN (NITROSTAT) 0.4 MG SL tablet [id number] No Place 1 tablet (0.4 mg total) under the tongue every 5 (five) minutes as needed for chest pain. NO more than 3 pills--if you have the need to take 2 pills or more call MD Laurey Morale, MD Never Active Self, Pharmacy Records  potassium chloride SA (KLOR-CON M) 20 MEQ tablet [id number] No Take 1 tablet (20 mEq total) by mouth as directed. Take 1 tablet only take when taking a extra lasix dose  Patient taking differently: Take 20 mEq by mouth as directed. Take 20 mEq (1 tablet) by mouth if extra dose of furosemide is taken.   [place], Anderson Malta, FNP Unk Active Self, Pharmacy Records  promethazine-dextromethorphan (PROMETHAZINE-DM) 6.25-15 MG/5ML syrup [id number] No Take 5 mLs by mouth 4 (four) times daily as needed for cough.  Patient not taking: Reported on 07/28/2022   Claiborne Rigg, NP Not Taking Active Self, Pharmacy Records  spironolactone (ALDACTONE) 25 MG tablet [id number] No Take 1 tablet (25 mg total) by mouth daily. Laurey Morale, MD Past Week Active Self, Pharmacy Records  Vitamin D, Ergocalciferol, (DRISDOL) 1.25 MG (50000 UNIT) CAPS capsule [id number] No Take 1 capsule (50,000 Units total) by mouth every 7 (seven) days. Claiborne Rigg, NP Past Week Active Self, Pharmacy Records  Med List Note Archer Asa, [place] 06/21/22 1206): Patient  receives [place] from [employer] ([employer], [place]) Sherryll Burger is sent from [employer] ([employer])            Home Care and Equipment/Supplies: Were Home Health Services Ordered?: NA Any new equipment or medical supplies ordered?: No  Functional Questionnaire: Do you need assistance with  bathing/showering or dressing?: Yes (Wife help with ADL when needed) Do you need assistance with meal preparation?: Yes (Wife prepares meal) Do you need assistance with eating?: No Do you have difficulty maintaining continence: No Do you need assistance with getting out of bed/getting out of a chair/moving?: No Do you have difficulty managing or taking your medications?: No  Follow up appointments reviewed: PCP Follow-up appointment confirmed?: Yes Date of PCP follow-up appointment?: 08/08/22 Follow-up Provider: Dr. Risa Grill Specialist [facility] Follow-up appointment confirmed?: NA Do you need transportation to your follow-up appointment?: No Do you understand care options if your condition(s) worsen?: Yes-patient verbalized understanding    SIGNATURE: Elsie Lincoln, RN

## 2022-08-02 ENCOUNTER — Other Ambulatory Visit (HOSPITAL_COMMUNITY): Payer: Self-pay | Admitting: Nurse Practitioner

## 2022-08-02 ENCOUNTER — Other Ambulatory Visit: Payer: Self-pay

## 2022-08-02 ENCOUNTER — Telehealth (HOSPITAL_COMMUNITY): Payer: Self-pay

## 2022-08-02 ENCOUNTER — Other Ambulatory Visit (HOSPITAL_COMMUNITY): Payer: Self-pay

## 2022-08-02 MED ORDER — CARVEDILOL 3.125 MG PO TABS
3.1250 mg | ORAL_TABLET | Freq: Two times a day (BID) | ORAL | 3 refills | Status: DC
  Filled 2022-08-02: qty 60, 30d supply, fill #0
  Filled 2022-08-29: qty 60, 30d supply, fill #1
  Filled 2022-10-03: qty 60, 30d supply, fill #2
  Filled 2022-10-31: qty 60, 30d supply, fill #3
  Filled 2022-12-01: qty 60, 30d supply, fill #4
  Filled 2022-12-28: qty 60, 30d supply, fill #5
  Filled 2023-01-27: qty 60, 30d supply, fill #6
  Filled 2023-02-28: qty 60, 30d supply, fill #7
  Filled 2023-03-28: qty 60, 30d supply, fill #8
  Filled 2023-04-27: qty 60, 30d supply, fill #9
  Filled 2023-05-28: qty 60, 30d supply, fill #10
  Filled 2023-06-28: qty 60, 30d supply, fill #11

## 2022-08-02 NOTE — Telephone Encounter (Signed)
Requested medication (s) are due for refill today:no  Requested medication (s) are on the active medication list: yes  Last refill:  08/02/22 #180 3 RF  Future visit scheduled:yes  Notes to clinic:  this med ordered by another prescriber Prince Rome FNP   Requested Prescriptions  Pending Prescriptions Disp Refills   carvedilol (COREG) 3.125 MG tablet 60 tablet 8    Sig: Take 1 tablet (3.125 mg total) by mouth 2 (two) times daily with a meal.     There is no refill protocol information for this order

## 2022-08-02 NOTE — Telephone Encounter (Addendum)
Post hospital follow up arranged   ----- Message from Laurey Morale, MD sent at 07/30/2022  9:16 AM EDT ----- He will need post-hospital followup within a week or so.  Lots of meds stopped this admission.

## 2022-08-04 ENCOUNTER — Other Ambulatory Visit: Payer: Self-pay

## 2022-08-05 ENCOUNTER — Ambulatory Visit: Payer: Self-pay

## 2022-08-05 ENCOUNTER — Other Ambulatory Visit (HOSPITAL_COMMUNITY): Payer: Self-pay

## 2022-08-05 ENCOUNTER — Other Ambulatory Visit: Payer: Self-pay

## 2022-08-05 ENCOUNTER — Other Ambulatory Visit: Payer: Self-pay | Admitting: Nurse Practitioner

## 2022-08-05 MED ORDER — CEPHALEXIN 500 MG PO CAPS
500.0000 mg | ORAL_CAPSULE | Freq: Four times a day (QID) | ORAL | 0 refills | Status: DC
  Filled 2022-08-05: qty 12, 3d supply, fill #0

## 2022-08-05 NOTE — Telephone Encounter (Signed)
Spoke with patient . Verified name & DOB  Advised that 3 more day of Keflex sent to pharmacy.

## 2022-08-05 NOTE — Telephone Encounter (Signed)
Keflex extended for 3 days

## 2022-08-05 NOTE — Telephone Encounter (Signed)
Chief Complaint: Cellulitis  Symptoms: swelling and redness in the left leg Frequency: Onset last Saturday Pertinent Negatives: Patient denies pain  Disposition: [] ED /[] Urgent Care (no appt availability in office) / [x] Appointment(In office/virtual)/ []  [place] [facility]/ [] Home Care/ [] Refused Recommended Disposition /[] [place] [facility]/ []  Follow-up with PCP Additional Notes: Patient stated he will complete his course of antibiotics today and the cellulitis in his left leg does not appear to be completely gone. Patient states the leg is still red and swollen, stated it is about 35% better since starting the antibiotic. Patient would like to know if he needs a refill on his antibiotic? Patient request the antibiotic be sent to the community [facility] pharmacy today if needed since it will be closed over the weekend. Patient states he is going to pick up medicine from the pharmacy around 1600 and would like antibiotic sent if needed to be picked up at the same time. Patient is scheduled for appointment on Monday in the office. Advised patient that I would forward message to provider for recommendations at this time. Summary: continue abx?   Pt wants to know if he should continue the abx another dr prescribed for his cellulitis.  Pt states area is still swollen.  Pt would like to discuss his sx     Reason for Disposition  [1] Finished taking antibiotics AND [2] symptoms are BETTER but [3] not completely gone  Answer Assessment - Initial Assessment Questions 1. INFECTION: "What infection is the antibiotic being given for?"     Cellulitis in the left leg 2. ANTIBIOTIC: "What antibiotic are you taking" "How many times per day?"     Cephalexin 500mg  4 times daily  3. DURATION: "When was the antibiotic started?"     Last Saturday 4. MAIN CONCERN OR SYMPTOM:  "What is your main concern right now?"     I still have a lot of swelling 5. BETTER-SAME-WORSE: "Are you getting better, staying the  same, or getting worse compared to when you first started the antibiotics?" If getting worse, ask: "In what way?"      Yes it has gotten a little better maybe only 35% improvement 6. FEVER: "Do you have a fever?" If Yes, ask: "What is your temperature, how was it measured, and when did it start?"     No 7. SYMPTOMS: "Are there any other symptoms you're concerned about?" If Yes, ask: "When did it start?"     No 8. FOLLOW-UP APPOINTMENT: "Do you have a follow-up appointment with your doctor?"     Yes on Monday 08/08/22  Protocols used: Infection on Antibiotic Follow-up Call-[facility]

## 2022-08-08 ENCOUNTER — Encounter: Payer: Self-pay | Admitting: Family Medicine

## 2022-08-08 ENCOUNTER — Other Ambulatory Visit: Payer: Self-pay

## 2022-08-08 ENCOUNTER — Ambulatory Visit: Payer: Self-pay | Attending: Family Medicine | Admitting: Family Medicine

## 2022-08-08 VITALS — BP 111/57 | HR 60 | Temp 98.1°F | Ht 75.0 in | Wt 290.8 lb

## 2022-08-08 DIAGNOSIS — L03116 Cellulitis of left lower limb: Secondary | ICD-10-CM

## 2022-08-08 DIAGNOSIS — I5022 Chronic systolic (congestive) heart failure: Secondary | ICD-10-CM

## 2022-08-08 DIAGNOSIS — I872 Venous insufficiency (chronic) (peripheral): Secondary | ICD-10-CM

## 2022-08-08 MED ORDER — CEPHALEXIN 500 MG PO CAPS
500.0000 mg | ORAL_CAPSULE | Freq: Four times a day (QID) | ORAL | 0 refills | Status: AC
  Filled 2022-08-08: qty 12, 3d supply, fill #0

## 2022-08-08 NOTE — Progress Notes (Signed)
Subjective:  Patient ID: Frank Love, male    DOB: 27-May-1955  Age: 67 y.o. MRN: [id number]  CC: Hospitalization Follow-up   HPI Frank Love is a 67 y.o. year old male with a history of CHF, hypertension, chronic venous insufficiency. He had been referred from the clinic to the ED due to left leg cellulitis with suspicion for septic shock.  Hospital course involved ICU stay, treatment with antibiotics and pressors.  Doppler was negative he was subsequently discharged on Keflex. Echo was unable to estimate his EF but revealed severely dilated right ventricle.  Interval History: Discussed the use of AI scribe software for clinical note transcription with the patient, who gave verbal consent to proceed.   He reports that the leg is still 'tight as a drum' and 'slightly red,' with some weeping and skin peeling. He has been taking Lasix 40mg  daily for fluid management and has one day left of his antibiotic course (Keflex). He has been unable to use his compression stockings due to the swelling in the leg. He denies fever, chills, and changes in appetite or urination.  The patient also reports that he has not restarted his Sherryll Burger since his hospitalization due to concerns about low blood pressure. He is scheduled to see his cardiologist next week for further management.  Denies presence of dizziness, chest pain or dyspnea.       Past Medical History:  Diagnosis Date   Cancer (HCC)    penile cancer   CHF (congestive heart failure) (HCC)    Chronic pain of right knee    Coronary artery disease    Hypertension    Phimosis     Past Surgical History:  Procedure Laterality Date   CIRCUMCISION N/A 10/12/2018   Procedure: CIRCUMCISION ADULT;  Surgeon: Marcine Matar, MD;  Location: [facility];  Service: Urology;  Laterality: N/A;  45 mins   CO2 LASER APPLICATION N/A 04/29/2019   Procedure: CO2 LASER APPLICATION;  Surgeon: Marcine Matar, MD;  Location: [facility];  Service:  Urology;  Laterality: N/A;  30 MINS   COLONOSCOPY     CORONARY ARTERY BYPASS GRAFT N/A 12/12/2018   Procedure: CORONARY ARTERY BYPASS GRAFTING (CABG) x four, using bilateral internal mammary arteries and right leg greater saphenous vein harvested endoscopically;  Surgeon: Linden Dolin, MD;  Location: [facility] OR;  Service: Open Heart Surgery;  Laterality: N/A;   LEFT HEART CATH AND CORONARY ANGIOGRAPHY N/A 11/28/2018   Procedure: LEFT HEART CATH AND CORONARY ANGIOGRAPHY;  Surgeon: Kathleene Hazel, MD;  Location: [facility] INVASIVE CV LAB;  Service: Cardiovascular;  Laterality: N/A;   MEDIASTINAL EXPLORATION N/A 12/12/2018   Procedure: Mediastinal Re-Exploration after bypass graft;  Surgeon: Linden Dolin, MD;  Location: [facility] OR;  Service: Open Heart Surgery;  Laterality: N/A;   TEE WITHOUT CARDIOVERSION N/A 12/12/2018   Procedure: TRANSESOPHAGEAL ECHOCARDIOGRAM (TEE);  Surgeon: Linden Dolin, MD;  Location: [facility] OR;  Service: Open Heart Surgery;  Laterality: N/A;   TOOTH EXTRACTION     TOTAL HIP ARTHROPLASTY Right 03/08/2019   Procedure: RIGHT TOTAL HIP ARTHROPLASTY ANTERIOR APPROACH;  Surgeon: Kathryne Hitch, MD;  Location: [facility];  Service: Orthopedics;  Laterality: Right;    Family History  Problem Relation Age of Onset   Diabetes Mother    Heart disease Mother     Social History   Socioeconomic History   Marital status: Married    Spouse name: Not on file   Number of children: Not on file  Years of education: Not on file   Highest education level: Not on file  Occupational History   Not on file  Tobacco Use   Smoking status: Never   Smokeless tobacco: Never  Vaping Use   Vaping status: Never Used  Substance and Sexual Activity   Alcohol use: No    Alcohol/week: 0.0 standard drinks of alcohol   Drug use: No   Sexual activity: Yes  Other Topics Concern   Not on file  Social History Narrative   Lives with wife.     Social Determinants of Health   Financial  Resource Strain: High Risk (12/28/2021)   Overall Financial Resource Strain (CARDIA)    Difficulty of Paying Living Expenses: Hard  Food Insecurity: No Food Insecurity (07/29/2022)   Hunger Vital Sign    Worried About Running Out of Food in the Last Year: Never true    Ran Out of Food in the Last Year: Never true  Transportation Needs: No Transportation Needs (07/29/2022)   PRAPARE - Administrator, Civil Service (Medical): No    Lack of Transportation (Non-Medical): No  Physical Activity: Inactive (07/29/2022)   Exercise Vital Sign    Days of Exercise per Week: 0 days    Minutes of Exercise per Session: 0 min  Stress: No Stress Concern Present (07/29/2022)   [employer] of Occupational Health - Occupational Stress Questionnaire    Feeling of Stress : Not at all  Social Connections: Not on file    No Known Allergies  Outpatient Medications Prior to Visit  Medication Sig Dispense Refill   aspirin EC 81 MG tablet Take 1 tablet (81 mg total) by mouth daily. Swallow whole. (Patient taking differently: Take 81 mg by mouth at bedtime.) 90 tablet 3   atorvastatin (LIPITOR) 40 MG tablet Take 1 tablet (40 mg total) by mouth daily at 6 PM. 90 tablet 30   carvedilol (COREG) 3.125 MG tablet Take 1 tablet (3.125 mg total) by mouth 2 (two) times daily with a meal. 180 tablet 3   dapagliflozin propanediol (FARXIGA) 10 MG TABS tablet TAKE 1 TABLET (10 MG TOTAL) BY MOUTH DAILY BEFORE BREAKFAST. 90 tablet 3   furosemide (LASIX) 40 MG tablet Take 1 tablet (40 mg total) by mouth daily. 30 tablet 3   nitroGLYCERIN (NITROSTAT) 0.4 MG SL tablet Place 1 tablet (0.4 mg total) under the tongue every 5 (five) minutes as needed for chest pain. NO more than 3 pills--if you have the need to take 2 pills or more call MD 30 tablet 3   potassium chloride SA (KLOR-CON M) 20 MEQ tablet Take 1 tablet (20 mEq total) by mouth as directed. Take 1 tablet only take when taking a extra lasix dose (Patient taking  differently: Take 20 mEq by mouth as directed. Take 20 mEq (1 tablet) by mouth if extra dose of furosemide is taken.) 30 tablet 0   promethazine-dextromethorphan (PROMETHAZINE-DM) 6.25-15 MG/5ML syrup Take 5 mLs by mouth 4 (four) times daily as needed for cough. 240 mL 0   spironolactone (ALDACTONE) 25 MG tablet Take 1 tablet (25 mg total) by mouth daily. 30 tablet 6   Vitamin D, Ergocalciferol, (DRISDOL) 1.25 MG (50000 UNIT) CAPS capsule Take 1 capsule (50,000 Units total) by mouth every 7 (seven) days. 12 capsule 1   cephALEXin (KEFLEX) 500 MG capsule Take 1 capsule (500 mg total) by mouth every 6 (six) hours for 3 days. 12 capsule 0   No facility-administered medications prior to visit.  ROS Review of Systems  Constitutional:  Negative for activity change and appetite change.  HENT:  Negative for sinus pressure and sore throat.   Respiratory:  Negative for chest tightness, shortness of breath and wheezing.   Cardiovascular:  Positive for leg swelling. Negative for chest pain and palpitations.  Gastrointestinal:  Negative for abdominal distention, abdominal pain and constipation.  Genitourinary: Negative.   Musculoskeletal: Negative.   Skin:  Positive for color change.  Psychiatric/Behavioral:  Negative for behavioral problems and dysphoric mood.     Objective:  BP (!) 111/57   Pulse 60   Temp 98.1 F (36.7 C) (Oral)   Ht 6\' 3"  (1.905 m)   Wt 290 lb 12.8 oz (131.9 kg)   SpO2 96%   BMI 36.35 kg/m      08/08/2022    1:53 PM 07/30/2022    9:25 AM 07/30/2022    4:00 AM  BP/Weight  Systolic BP 111 101 101  Diastolic BP 57 57 62  Wt. (Lbs) 290.8    BMI 36.35 kg/m2        Physical Exam Constitutional:      Appearance: He is well-developed.  Neck:     Comments: No JVD Cardiovascular:     Rate and Rhythm: Normal rate.     Heart sounds: Normal heart sounds. No murmur heard. Pulmonary:     Effort: Pulmonary effort is normal.     Breath sounds: Normal breath sounds. No  wheezing or rales.  Chest:     Chest wall: No tenderness.  Abdominal:     General: Bowel sounds are normal. There is no distension.     Palpations: Abdomen is soft. There is no mass.     Tenderness: There is no abdominal tenderness.  Musculoskeletal:        General: Normal range of motion.     Right lower leg: Edema present.     Left lower leg: Edema (2+) present.     Comments: Erythema of left leg with dry scab on lateral half of dorsum.  Slight warmth present  Neurological:     Mental Status: He is alert and oriented to person, place, and time.  Psychiatric:        Mood and Affect: Mood normal.        Latest Ref Rng & Units 07/30/2022    3:00 AM 07/29/2022    4:26 AM 07/28/2022    2:04 AM  CMP  Glucose 70 - 99 mg/dL 295  284  132   BUN 8 - 23 mg/dL 32  35  43   Creatinine 0.61 - 1.24 mg/dL 4.40  1.02  7.25   Sodium 135 - 145 mmol/L 137  135  137   Potassium 3.5 - 5.1 mmol/L 4.3  3.9  3.6   Chloride 98 - 111 mmol/L 108  107  109   CO2 22 - 32 mmol/L 22  20  20    Calcium 8.9 - 10.3 mg/dL 7.8  7.6  7.8     Lipid Panel     Component Value Date/Time   CHOL 118 04/20/2022 1141   CHOL 182 08/03/2018 1024   TRIG 123 04/20/2022 1141   HDL 40 (L) 04/20/2022 1141   HDL 53 08/03/2018 1024   CHOLHDL 3.0 04/20/2022 1141   VLDL 25 04/20/2022 1141   LDLCALC 53 04/20/2022 1141   LDLCALC 116 (H) 08/03/2018 1024    CBC    Component Value Date/Time   WBC 6.6 07/30/2022 0300  RBC 4.12 (L) 07/30/2022 0300   HGB 11.5 (L) 07/30/2022 0300   HGB 13.0 03/08/2022 1635   HCT 36.0 (L) 07/30/2022 0300   HCT 39.7 03/08/2022 1635   PLT 207 07/30/2022 0300   PLT 251 03/08/2022 1635   MCV 87.4 07/30/2022 0300   MCV 85 03/08/2022 1635   MCH 27.9 07/30/2022 0300   MCHC 31.9 07/30/2022 0300   RDW 14.8 07/30/2022 0300   RDW 13.7 03/08/2022 1635   LYMPHSABS 0.8 07/30/2022 0300   LYMPHSABS 0.9 03/08/2022 1635   MONOABS 0.4 07/30/2022 0300   EOSABS 0.4 07/30/2022 0300   EOSABS 0.5 (H)  03/08/2022 1635   BASOSABS 0.0 07/30/2022 0300   BASOSABS 0.1 03/08/2022 1635    Lab Results  Component Value Date   HGBA1C 6.2 (H) 03/08/2022    Assessment & Plan:      Cellulitis of the Left Leg: Recent hospitalization for sepsis secondary to cellulitis. Currently on last day of Keflex. Reports tightness and redness in the left leg. Noted skin peeling but no weeping at this time. -Continue current dose of Lasix 40mg  daily for edema. -Start another course of Keflex to ensure complete resolution of infection. -Encourage use of compression stockings and elevation of the leg. -Apply moisturizer to the affected area.   Venous Insufficiency: History of venous insufficiency, which predisposes to recurrent cellulitis. Reports swelling in the left leg. -Continue current management strategies including leg elevation and compression stockings. -Consider follow-up with vascular doctor, Dr. Lemar Livings  Congestive Heart Failure: Stable with no acute exacerbation. Currently off Entresto due to low blood pressure. -Continue current medications. -Cardiology follow-up scheduled for next Monday to discuss medication adjustments, specifically the potential to restart Entresto.           Meds ordered this encounter  Medications   cephALEXin (KEFLEX) 500 MG capsule    Sig: Take 1 capsule (500 mg total) by mouth every 6 (six) hours for 3 days.    Dispense:  12 capsule    Refill:  0    Follow-up: Return in about 3 months (around 11/08/2022) for Medical conditions with PCP.       Hoy Register, MD, FAAFP. [facility] [place], [place] [phone]   08/08/2022, 5:25 PM

## 2022-08-08 NOTE — Patient Instructions (Signed)
VISIT SUMMARY:  During your visit, we discussed your recent hospitalization due to a severe infection (septic shock) caused by a skin infection (cellulitis) in your left leg. You mentioned that your leg is still swollen, slightly red, and has some fluid leakage and skin peeling. You have been taking a water pill (Lasix) to manage the fluid and are about to finish your antibiotic course ([place]). You also mentioned that you have not restarted your heart medication (Entresto) due to concerns about low blood pressure.  YOUR PLAN:  -CELLULITIS OF THE LEFT LEG: This is a skin infection that has caused your recent hospitalization. We will continue your current dose of Lasix for the swelling, start another course of antibiotics to ensure the infection is completely gone, and encourage you to use compression stockings and elevate your leg. Also, apply moisturizer to the affected area.  -VENOUS INSUFFICIENCY: This is a condition where the veins in your legs have trouble sending blood back to your heart. We will continue with the current management strategies, including leg elevation and compression stockings. We may consider a follow-up with a vascular doctor.  -CONGESTIVE HEART FAILURE: This is a condition where your heart doesn't pump blood as well as it should. We will continue with your current medications and have a follow-up scheduled with your cardiologist next Monday to discuss potential adjustments to your medications, specifically the possibility of restarting Entresto.  INSTRUCTIONS:  Please continue to take your Lasix as prescribed. Start another course of Keflex to ensure the infection in your leg is completely gone. Try to use your compression stockings and elevate your leg as much as possible. Apply moisturizer to the affected area of your leg. Continue with your current heart medications and remember to attend your cardiology appointment next Monday.

## 2022-08-09 LAB — BASIC METABOLIC PANEL
BUN/Creatinine Ratio: 21 (ref 10–24)
BUN: 28 mg/dL — ABNORMAL HIGH (ref 8–27)
CO2: 20 mmol/L (ref 20–29)
Calcium: 8.5 mg/dL — ABNORMAL LOW (ref 8.6–10.2)
Chloride: 104 mmol/L (ref 96–106)
Creatinine, Ser: 1.35 mg/dL — ABNORMAL HIGH (ref 0.76–1.27)
Glucose: 97 mg/dL (ref 70–99)
Potassium: 4.7 mmol/L (ref 3.5–5.2)
Sodium: 138 mmol/L (ref 134–144)
eGFR: 58 mL/min/{1.73_m2} — ABNORMAL LOW (ref >59)

## 2022-08-09 LAB — CBC WITH DIFFERENTIAL/PLATELET
Basophils Absolute: 0.1 10*3/uL (ref 0.0–0.2)
Basos: 2 %
EOS (ABSOLUTE): 0.3 10*3/uL (ref 0.0–0.4)
Eos: 5 %
Hematocrit: 36.6 % — ABNORMAL LOW (ref 37.5–51.0)
Hemoglobin: 11.9 g/dL — ABNORMAL LOW (ref 13.0–17.7)
Immature Grans (Abs): 0 10*3/uL (ref 0.0–0.1)
Immature Granulocytes: 1 %
Lymphocytes Absolute: 1.1 10*3/uL (ref 0.7–3.1)
Lymphs: 20 %
MCH: 27.8 pg (ref 26.6–33.0)
MCHC: 32.5 g/dL (ref 31.5–35.7)
MCV: 86 fL (ref 79–97)
Monocytes Absolute: 0.4 10*3/uL (ref 0.1–0.9)
Monocytes: 7 %
Neutrophils Absolute: 3.6 10*3/uL (ref 1.4–7.0)
Neutrophils: 65 %
Platelets: 286 10*3/uL (ref 150–450)
RBC: 4.28 x10E6/uL (ref 4.14–5.80)
RDW: 13.2 % (ref 11.6–15.4)
WBC: 5.5 10*3/uL (ref 3.4–10.8)

## 2022-08-11 ENCOUNTER — Other Ambulatory Visit (HOSPITAL_COMMUNITY): Payer: Self-pay

## 2022-08-11 ENCOUNTER — Other Ambulatory Visit (HOSPITAL_COMMUNITY): Payer: Self-pay | Admitting: Cardiology

## 2022-08-11 ENCOUNTER — Telehealth: Payer: Self-pay | Admitting: Nurse Practitioner

## 2022-08-11 MED ORDER — ATORVASTATIN CALCIUM 40 MG PO TABS
40.0000 mg | ORAL_TABLET | Freq: Every day | ORAL | 30 refills | Status: DC
  Filled 2022-08-11: qty 90, 90d supply, fill #0
  Filled 2022-11-17: qty 90, 90d supply, fill #1
  Filled 2023-01-27: qty 90, 90d supply, fill #2
  Filled 2023-02-28: qty 90, 90d supply, fill #3
  Filled 2023-03-28: qty 30, 30d supply, fill #3
  Filled 2023-05-28: qty 30, 30d supply, fill #4
  Filled 2023-06-28: qty 30, 30d supply, fill #5
  Filled 2023-07-25: qty 30, 30d supply, fill #6

## 2022-08-11 NOTE — Telephone Encounter (Signed)
The patient called in stating the antibiotic he is taking is not helping enough, He states it is still real tight above the knee near the thigh area and on top of the left foot. He has been using cephALEXin (KEFLEX) 500 MG capsule . He states he didn't know if this is just natural for it to take this long to heal and the infection to come out or not. If it is then he will need another prescription for the same meds as it is taking so long or for something even stronger. Please assist patient further as he uses   [facility] Phone: [phone]  Fax: [phone]

## 2022-08-11 NOTE — Telephone Encounter (Signed)
Call placed to patient unable to reach message left on VM.   

## 2022-08-11 NOTE — Telephone Encounter (Signed)
Spoke with patient . Verified name & DOB   Patient voiced that he is finishing up the last of his antibiotics today and his s/s have not improved that much. His leg still feels tight and he does not feel like it is healing. Patient voiced that he felt he needed more antibiotics. Patient is worried that because he is  completing antibiotic today without his s/s being totally resolved, that the infection will return and he will end up in the hospital again. Advised patient that even though he is completing the antibiotic today , they continue to work to kill his infection. Advised patient that his PCP will be made aware of his concerns.

## 2022-08-11 NOTE — Telephone Encounter (Signed)
He would need an office visit for evaluation. As I am out of town this week coming up he can be directed to the mobile unit, [place] or available provider at [facility] if any openings.

## 2022-08-12 ENCOUNTER — Other Ambulatory Visit (HOSPITAL_COMMUNITY): Payer: Self-pay

## 2022-08-12 NOTE — Telephone Encounter (Signed)
Spoke with patient . Verified name & DOB  Patient agreed to go to [facility] on Tuesday 08/16/2022

## 2022-08-15 ENCOUNTER — Other Ambulatory Visit: Payer: Self-pay

## 2022-08-15 ENCOUNTER — Encounter (HOSPITAL_COMMUNITY): Payer: Self-pay

## 2022-08-15 ENCOUNTER — Other Ambulatory Visit (HOSPITAL_COMMUNITY): Payer: Self-pay

## 2022-08-15 ENCOUNTER — Ambulatory Visit (HOSPITAL_BASED_OUTPATIENT_CLINIC_OR_DEPARTMENT_OTHER)
Admission: RE | Admit: 2022-08-15 | Discharge: 2022-08-15 | Disposition: A | Discharge status: Home / Self Care | Payer: Self-pay | Source: Ambulatory Visit | Attending: Family Medicine | Admitting: Family Medicine

## 2022-08-15 ENCOUNTER — Ambulatory Visit (HOSPITAL_COMMUNITY)
Admission: RE | Admit: 2022-08-15 | Discharge: 2022-08-15 | Disposition: A | Discharge status: Home / Self Care | Payer: Self-pay | Source: Ambulatory Visit | Attending: Family Medicine | Admitting: Family Medicine

## 2022-08-15 VITALS — BP 126/68 | HR 65 | Ht 74.0 in | Wt 290.4 lb

## 2022-08-15 DIAGNOSIS — Z7982 Long term (current) use of aspirin: Secondary | ICD-10-CM | POA: Insufficient documentation

## 2022-08-15 DIAGNOSIS — E669 Obesity, unspecified: Secondary | ICD-10-CM | POA: Insufficient documentation

## 2022-08-15 DIAGNOSIS — M7989 Other specified soft tissue disorders: Secondary | ICD-10-CM | POA: Insufficient documentation

## 2022-08-15 DIAGNOSIS — I2510 Atherosclerotic heart disease of native coronary artery without angina pectoris: Secondary | ICD-10-CM | POA: Insufficient documentation

## 2022-08-15 DIAGNOSIS — L03116 Cellulitis of left lower limb: Secondary | ICD-10-CM | POA: Insufficient documentation

## 2022-08-15 DIAGNOSIS — Z951 Presence of aortocoronary bypass graft: Secondary | ICD-10-CM | POA: Insufficient documentation

## 2022-08-15 DIAGNOSIS — I493 Ventricular premature depolarization: Secondary | ICD-10-CM

## 2022-08-15 DIAGNOSIS — R0602 Shortness of breath: Secondary | ICD-10-CM | POA: Insufficient documentation

## 2022-08-15 DIAGNOSIS — Z8616 Personal history of COVID-19: Secondary | ICD-10-CM | POA: Insufficient documentation

## 2022-08-15 DIAGNOSIS — R609 Edema, unspecified: Secondary | ICD-10-CM | POA: Insufficient documentation

## 2022-08-15 DIAGNOSIS — I110 Hypertensive heart disease with heart failure: Secondary | ICD-10-CM | POA: Insufficient documentation

## 2022-08-15 DIAGNOSIS — G4733 Obstructive sleep apnea (adult) (pediatric): Secondary | ICD-10-CM | POA: Insufficient documentation

## 2022-08-15 DIAGNOSIS — Z79899 Other long term (current) drug therapy: Secondary | ICD-10-CM | POA: Insufficient documentation

## 2022-08-15 DIAGNOSIS — I5022 Chronic systolic (congestive) heart failure: Secondary | ICD-10-CM | POA: Insufficient documentation

## 2022-08-15 DIAGNOSIS — Z8249 Family history of ischemic heart disease and other diseases of the circulatory system: Secondary | ICD-10-CM | POA: Insufficient documentation

## 2022-08-15 DIAGNOSIS — Z833 Family history of diabetes mellitus: Secondary | ICD-10-CM | POA: Insufficient documentation

## 2022-08-15 DIAGNOSIS — R0683 Snoring: Secondary | ICD-10-CM | POA: Insufficient documentation

## 2022-08-15 DIAGNOSIS — I872 Venous insufficiency (chronic) (peripheral): Secondary | ICD-10-CM | POA: Insufficient documentation

## 2022-08-15 DIAGNOSIS — I255 Ischemic cardiomyopathy: Secondary | ICD-10-CM | POA: Insufficient documentation

## 2022-08-15 DIAGNOSIS — R7989 Other specified abnormal findings of blood chemistry: Secondary | ICD-10-CM

## 2022-08-15 LAB — BASIC METABOLIC PANEL
Anion gap: 11 (ref 5–15)
BUN: 26 mg/dL — ABNORMAL HIGH (ref 8–23)
CO2: 24 mmol/L (ref 22–32)
Calcium: 8.8 mg/dL — ABNORMAL LOW (ref 8.9–10.3)
Chloride: 104 mmol/L (ref 98–111)
Creatinine, Ser: 1.38 mg/dL — ABNORMAL HIGH (ref 0.61–1.24)
GFR, Estimated: 56 mL/min — ABNORMAL LOW (ref >60)
Glucose, Bld: 113 mg/dL — ABNORMAL HIGH (ref 70–99)
Potassium: 3.8 mmol/L (ref 3.5–5.1)
Sodium: 139 mmol/L (ref 135–145)

## 2022-08-15 LAB — BRAIN NATRIURETIC PEPTIDE: B Natriuretic Peptide: 353.8 pg/mL — ABNORMAL HIGH (ref 0.0–100.0)

## 2022-08-15 MED ORDER — ENTRESTO 24-26 MG PO TABS: 1.0000 | ORAL_TABLET | Freq: Two times a day (BID) | ORAL | 11 refills | Status: DC

## 2022-08-15 NOTE — Progress Notes (Signed)
PCP:  Claiborne Rigg, NP  Cardiologist:  Rollene Rotunda, MD HF Cardiology: Dr Shirlee Latch    HPI: Frank Love is a 67 y.o. male with a history of HTN, LE edema, and elevated BNP who returns for followup of CHF and CAD.   He initially was seen by PCP in 5/20 with elevated BNP and was started on lasix with cardiology referral, lasix was subsequently increased to 40 mg bid due to ongoing LE edema. He underwent cardiac evaluation for right hip surgery in September with continued symptoms of LE edema on lasix 40 bid. Echo in 9/20 showed systolic HF with EF 30-35%. He was started on entresto, metoprolol at this time.   He underwent cath 11/20 which showed multivessel disease, had CABGx4 12/12/18.  Repeat echo done post-CABG 12/17/18 showed severely decreased right ventricular systolic function with severe enlargement in addition to small pericardial effusion, otherwise similar to previous. VQ scan was negative for PE. Transferred to CIR for rehab.   He had right THR done in 2/21 with no complications.   Echo repeated 03/2019, EF 35-40%, mild LVH, mild LV dilation, moderately decreased RV systolic function, normal RV size.   Admitted to [facility] 11/19-12/10/21 for COVID-19 viral pneumonia requiring up to 30 L of HFNC.  Received remdesivir x 5 days, steroid taper, tocilizumab/baricitinib. Hospitalization complicated by development of severe orthostatic hypotension and thrombocytopenia. Echo 12/14/19 with EF 45-50%. Heart Failure team consulted for on-going hypotension and bradycardia. Beta-blocker held, midodrine 15 mg tid started, and abdominal binder and TED hose applied. Patient discharged home off all GDMT medications due to hypotension and bradycardia.   Echo in 3/22 showed EF 35-40%, diffuse hypokinesis, moderately decreased RV systolic function with normal RV size.  Technically difficult study.  cMRI (9/22) showed LVEF 33%, RVEF 30%, subendocardial LGE suggestive of coronary disease.  Follow up  3/24, NYHA I-II and volume stable, REDs 33%. He had seen EP and declined ICD.Marland Kitchen  Admitted 7/24 with sepsis 2/2 LLE cellulitis. EF ~ 30-35% range (on Dr. Alford Highland read) with moderate RV dilation/moderate dysfunction. GDMT held with hypotension and AKI, transiently required NE for BP support. He was discharged home, weight 294 lbs.  Today he returns for post hospital HF follow up. Overall feeling fine. He has SOB walking > 1/4 mile, otherwise no issues. Has swelling in LLE with tightness in upper thigh. No fever or chills. Denies palpitations, abnormal bleeding, CP, dizziness, or PND/Orthopnea. Appetite ok. No fever or chills. Weight at home 291 pounds. Taking all medications.  ECG (personally reviewed): SB 59 bpm  Labs (2/21): K 4.7, creatinine 1.07, LDL 61 Labs (3/21): K 4.3, creatinine 1.05, digoxin 0.9 Labs (5/21): K 4.3, creatinine 1.25  Labs (12/21): K 4.2 => 3.5, creatinine 1.17 => 0.91, plts 60K Labs (2/22): LDL 113, HDL 40 Labs (3/22): K 3.9, creatinine 1.12, BNP 272 Labs (7/22): LDL 59, HDL 41, K 3.7, creatinine 2.95 Labs (1/23): K 3.8, creatinine 1.17 Labs (4/23): K 3.9, creatinine 1.32, LDL 48, HDL 33 Labs (2/24): K 4.3, creatinine 1.40 Labs (7/24): K 4.7, creatinine 1.35  PMH: 1. OA: s/p right THR.  2. HTN 3. Carotid stenosis: Carotid dopplers (11/20) with 40-59% RICA stenosis.  - Carotid dopplers (12/21)  right ICA 40-59% stenosis, left ICA mild stenosis.  - Carotid dopplers (1/23): RICA 40-59% - Carotid dopplers (2/24): RICA 40-59% 4. CAD: LHC in 11/20 with totally occluded OM, totally occluded mid LAD, totally occluded mid RCA.  - CABG 11/20 with LIMA-LAD, RIMA-PDA, SVG-OM1,  SVG-AM 5. Chronic systolic CHF: Ischemic cardiomyopathy.  - Echo (9/20): EF 30-35%, mildly decreased RV systolic function.  - Echo (11/20): EF 30-35%, severely dilated RV with severely decreased systolic function. - Echo (3/21): EF 35-40%, mild LV dilation with mild LVH, moderately decreased RV  systolic function with normal RV size, PASP 33 mmHg.   - Echo (11/21): LVEF improved to 45-50%. RV mildly reduced  - Echo (3/22): EF 35-40%, diffuse hypokinesis, moderately decreased RV systolic function with normal RV size.   - cMRI (9/22) showed LVEF 33%, RVEF 30%, subendocardial LGE suggestive of coronary disease. - Echo (7/24): EF looks 30-35% range on Dr. Alford Highland read, with moderate RV dilation/moderate dysfunction 6. V/Q scan negative for PE in 11/20.  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  8. COVID-19 11/21  Past Surgical History:  Procedure Laterality Date   CIRCUMCISION N/A 10/12/2018   Procedure: CIRCUMCISION ADULT;  Surgeon: Marcine Matar, MD;  Location: [facility];  Service: Urology;  Laterality: N/A;  45 mins   CO2 LASER APPLICATION N/A 04/29/2019   Procedure: CO2 LASER APPLICATION;  Surgeon: Marcine Matar, MD;  Location: [facility];  Service: Urology;  Laterality: N/A;  30 MINS   COLONOSCOPY     CORONARY ARTERY BYPASS GRAFT N/A 12/12/2018   Procedure: CORONARY ARTERY BYPASS GRAFTING (CABG) x four, using bilateral internal mammary arteries and right leg greater saphenous vein harvested endoscopically;  Surgeon: Linden Dolin, MD;  Location: [facility] OR;  Service: Open Heart Surgery;  Laterality: N/A;   LEFT HEART CATH AND CORONARY ANGIOGRAPHY N/A 11/28/2018   Procedure: LEFT HEART CATH AND CORONARY ANGIOGRAPHY;  Surgeon: Kathleene Hazel, MD;  Location: [facility] IN[facility];  Service: Cardiovascular;  Laterality: N/A;   MEDIASTINAL EXPLORATION N/A 12/12/2018   Procedure: Mediastinal Re-Exploration after bypass graft;  Surgeon: Linden Dolin, MD;  Location: [facility] OR;  Service: Open Heart Surgery;  Laterality: N/A;   TEE WITHOUT CARDIOVERSION N/A 12/12/2018   Procedure: TRANSESOPHAGEAL ECHOCARDIOGRAM (TEE);  Surgeon: Linden Dolin, MD;  [facility];  Service: Open Heart Surgery;  Laterality: N/A;   TOOTH EXTRACTION     TOTAL HIP ARTHROPLASTY Right 03/08/2019   Procedure:  RIGHT TOTAL HIP ARTHROPLASTY ANTERIOR APPROACH;  Surgeon: Kathryne Hitch, MD;  Location: [facility];  Service: Orthopedics;  Laterality: Right;   Current Outpatient Medications  Medication Sig Dispense Refill   aspirin EC 81 MG tablet Take 1 tablet (81 mg total) by mouth daily. Swallow whole. (Patient taking differently: Take 81 mg by mouth at bedtime.) 90 tablet 3   atorvastatin (LIPITOR) 40 MG tablet Take 1 tablet (40 mg total) by mouth daily at 6 PM. 90 tablet 30   carvedilol (COREG) 3.125 MG tablet Take 1 tablet (3.125 mg total) by mouth 2 (two) times daily with a meal. 180 tablet 3   dapagliflozin propanediol (FARXIGA) 10 MG TABS tablet TAKE 1 TABLET (10 MG TOTAL) BY MOUTH DAILY BEFORE BREAKFAST. 90 tablet 3   furosemide (LASIX) 40 MG tablet Take 1 tablet (40 mg total) by mouth daily. 30 tablet 3   nitroGLYCERIN (NITROSTAT) 0.4 MG SL tablet Place 1 tablet (0.4 mg total) under the tongue every 5 (five) minutes as needed for chest pain. NO more than 3 pills--if you have the need to take 2 pills or more call MD 30 tablet 3   potassium chloride SA (KLOR-CON M) 20 MEQ tablet Take 1 tablet (20 mEq total) by mouth as directed. Take 1 tablet only take when taking a extra lasix  dose (Patient taking differently: Take 20 mEq by mouth as directed. Take 20 mEq (1 tablet) by mouth if extra dose of furosemide is taken.) 30 tablet 0   spironolactone (ALDACTONE) 25 MG tablet Take 1 tablet (25 mg total) by mouth daily. 30 tablet 6   Vitamin D, Ergocalciferol, (DRISDOL) 1.25 MG (50000 UNIT) CAPS capsule Take 1 capsule (50,000 Units total) by mouth every 7 (seven) days. (Patient taking differently: Take 50,000 Units by mouth every 7 (seven) days. Pt takes every Thursday) 12 capsule 1   promethazine-dextromethorphan (PROMETHAZINE-DM) 6.25-15 MG/5ML syrup Take 5 mLs by mouth 4 (four) times daily as needed for cough. (Patient not taking: Reported on 08/15/2022) 240 mL 0   No current facility-administered  medications for this encounter.   Allergies:   Patient has no known allergies.   Social History:  The patient  reports that he has never smoked. He has never used smokeless tobacco. He reports that he does not drink alcohol and does not use drugs.   Family History:  The patient's family history includes Diabetes in his mother; Heart disease in his mother.   ROS:  Please see the history of present illness.   All other systems are personally reviewed and negative.   Wt Readings from Last 3 Encounters:  08/15/22 131.7 kg (290 lb 6.4 oz)  08/08/22 131.9 kg (290 lb 12.8 oz)  07/30/22 (!) 137 kg (302 lb 0.5 oz)   BP 126/68   Pulse 65   Ht 6\' 2"  (1.88 m)   Wt 131.7 kg (290 lb 6.4 oz)   SpO2 96%   BMI 37.29 kg/m    Physical Exam  General:  NAD. No resp difficulty, walked into clinic HEENT: Normal Neck: Supple. No JVD. Carotids 2+ bilat; no bruits. No lymphadenopathy or thryomegaly appreciated. Cor: PMI nondisplaced. Regular rate & rhythm. No rubs, gallops or murmurs. Lungs: Clear Abdomen: Soft, obese, nontender, nondistended. No hepatosplenomegaly. No bruits or masses. Good bowel sounds. Extremities: No cyanosis, clubbing, rash, 1-2+ LLE edema into thigh Neuro: Alert & oriented x 3, cranial nerves grossly intact. Moves all 4 extremities w/o difficulty. Affect pleasant.  Recent Labs: 07/27/2022: ALT 30 07/29/2022: TSH 1.336 07/30/2022: B Natriuretic Peptide 492.9; Magnesium 2.2 08/08/2022: BUN 28; Creatinine, Ser 1.35; Hemoglobin 11.9; Platelets 286; Potassium 4.7; Sodium 138  Personally reviewed   Assessment/Plan 1. Chronic biventricular heart failure: Ischemic cardiomyopathy.  Cardiac MRI in 9/22 showed showed LVEF 33%, RVEF 30%, subendocardial LGE suggestive of coronary disease.  Echo this admission, EF looks 30-35% range on Dr. Alford Highland read, with moderate RV dilation/moderate dysfunction. This may not be significantly different from 9/22 cMRI. Ischemic cardiomyopathy. He has refused ICD  in the past. GDMT recently limited by septic shock and AKI. Today, NYHA I-II. He is not volume overloaded by exam, LLE still with edema, suspect chronic venous insufficiency.  - Restart Entresto 24/26 mg bid. BMET/BNP today, repeat BMET in 10 days. - Continue Lasix 40 mg daily. - Continue 3.125 mg bid - Continue Farxiga 10 mg daily. - Continue spironolactone 25 mg daily.  - He is not interested in ICD. 2. H/x of AKI: Baseline Scr 1.2-1.5, up to 3.27 on admit in setting of shock.  - BMET today. 3. CAD: S/p CABG in 2020. No chest pain.  - Continue aspirin and statin 4. Left lower extremity cellulitis: With associated septic shock, now resolved. LE dopplers negative for DVT. No fever or chills. Of note, he follows with [facility] and has a history of chronic venous  insufficiency with large great saphenous vein with extensive reflux on the left.  - Continues with LLE edema into thigh, Ray says it has not changed much since discharge. - Check stat left LE dopplers 5. PVCs: None on ECG today. He snores. - Arrange home sleep study.   Follow up in 4 months with Dr. Shirlee Latch.  Signed, Jacklynn Ganong, FNP  08/15/2022   [facility] [address] Heart and Vascular [place] [address] [phone] (office) [phone] (fax)

## 2022-08-15 NOTE — Patient Instructions (Addendum)
Thank you for coming in today  If you had labs drawn today, any labs that are abnormal the clinic will call you No news is good news  Medications: RESTART Entresto 24/26 mg 1 tablet twice daily  You will have a doppler done on your left leg today    Follow up appointments: Your physician recommends that you return for lab work in: 10-14 days BMET  Your physician recommends that you schedule a follow-up appointment in:  4 months With Dr. Earlean Shawl will receive a reminder letter in the mail a few months in advance. If you don't receive a letter, please call our office to schedule the follow-up appointment.    Do the following things EVERYDAY: Weigh yourself in the morning before breakfast. Write it down and keep it in a log. Take your medicines as prescribed Eat low salt foods--Limit salt (sodium) to 2000 mg per day.  Stay as active as you can everyday Limit all fluids for the day to less than 2 liters   At the [facility], you and your health needs are our priority. As part of our continuing mission to provide you with exceptional heart care, we have created designated Provider Care Teams. These Care Teams include your primary Cardiologist (physician) and Advanced Practice Providers (APPs- Physician Assistants and Nurse Practitioners) who all work together to provide you with the care you need, when you need it.   You may see any of the following providers on your designated Care Team at your next follow up: Dr Arvilla Meres Dr Marca Ancona Dr. Marcos Eke, NP Robbie Lis, [place] [facility] [place], [place] Brynda Peon, NP Karle Plumber, PharmD   Please be sure to bring in all your medications bottles to every appointment.    Thank you for choosing [facility]  If you have any questions or concerns before your next appointment please send [place] a message through Ramos or call our office at  [phone].    TO LEAVE A MESSAGE FOR THE NURSE SELECT OPTION 2, PLEASE LEAVE A MESSAGE INCLUDING: YOUR NAME DATE OF BIRTH CALL BACK NUMBER REASON FOR CALL**this is important as we prioritize the call backs  YOU WILL RECEIVE A CALL BACK THE SAME DAY AS LONG AS YOU CALL BEFORE 4:00 PM

## 2022-08-16 ENCOUNTER — Telehealth (HOSPITAL_COMMUNITY): Payer: Self-pay | Admitting: Pharmacy Technician

## 2022-08-16 MED ORDER — ENTRESTO 24-26 MG PO TABS: 1.0000 | ORAL_TABLET | Freq: Two times a day (BID) | ORAL | 3 refills | Status: DC

## 2022-08-16 NOTE — Telephone Encounter (Signed)
Advanced Heart Failure Patient Advocate Encounter  Patient was recently started back on Entresto at the 24-26mg  and has current [employer] assistance approval. [employer] to make sure that the assistance application was not put on hold due to the patient being taken off the medication.   [employer], they do have the application listed as active but have 97-103mg  script on file. Sent 90 day RX (24-26mg ) request to Chantel (CMA) to send to [facility].  Called and spoke with the patient. He is concerned he will not get the shipment before he goes out of town for vacation. Will provide samples.  Archer Asa, CPhT

## 2022-08-16 NOTE — Addendum Note (Signed)
Addended by: Theresia Bough on: 08/16/2022 03:36 PM   Modules accepted: Orders

## 2022-08-16 NOTE — Telephone Encounter (Signed)
Opened in error

## 2022-08-16 NOTE — Telephone Encounter (Signed)
Medication Samples have been provided to the patient.  Drug name: Sherryll Burger       Strength: 24-26mg         Qty: 2 bottles  LOT: [id number]  Exp.Date: 08/2023  Dosing instructions: Take 1 tablet by mouth twice daily.   The patient has been instructed regarding the correct time, dose, and frequency of taking this medication, including desired effects and most common side effects.   [name] Kell Himmat Enberg 4:20 PM 08/16/2022

## 2022-08-29 ENCOUNTER — Other Ambulatory Visit: Payer: Self-pay

## 2022-08-29 ENCOUNTER — Other Ambulatory Visit (HOSPITAL_COMMUNITY): Payer: Self-pay | Admitting: Cardiology

## 2022-08-29 ENCOUNTER — Other Ambulatory Visit (HOSPITAL_COMMUNITY): Payer: Self-pay

## 2022-08-29 MED ORDER — FUROSEMIDE 40 MG PO TABS
40.0000 mg | ORAL_TABLET | Freq: Every day | ORAL | 4 refills | Status: DC
  Filled 2022-08-29 – 2022-08-30 (×2): qty 30, 30d supply, fill #0
  Filled 2022-10-03: qty 30, 30d supply, fill #1
  Filled 2022-12-01: qty 30, 30d supply, fill #2
  Filled 2022-12-28: qty 30, 30d supply, fill #3
  Filled 2023-02-28: qty 30, 30d supply, fill #4

## 2022-08-30 ENCOUNTER — Other Ambulatory Visit (HOSPITAL_COMMUNITY): Payer: Self-pay

## 2022-08-30 ENCOUNTER — Other Ambulatory Visit: Payer: Self-pay

## 2022-08-30 ENCOUNTER — Ambulatory Visit (HOSPITAL_COMMUNITY)
Admission: RE | Admit: 2022-08-30 | Discharge: 2022-08-30 | Disposition: A | Discharge status: Home / Self Care | Payer: Self-pay | Source: Ambulatory Visit | Attending: Cardiology | Admitting: Cardiology

## 2022-08-30 DIAGNOSIS — I5022 Chronic systolic (congestive) heart failure: Secondary | ICD-10-CM | POA: Insufficient documentation

## 2022-08-30 LAB — BASIC METABOLIC PANEL
Anion gap: 10 (ref 5–15)
BUN: 24 mg/dL — ABNORMAL HIGH (ref 8–23)
CO2: 22 mmol/L (ref 22–32)
Calcium: 8.4 mg/dL — ABNORMAL LOW (ref 8.9–10.3)
Chloride: 106 mmol/L (ref 98–111)
Creatinine, Ser: 1.23 mg/dL (ref 0.61–1.24)
GFR, Estimated: >60 mL/min (ref >60)
Glucose, Bld: 108 mg/dL — ABNORMAL HIGH (ref 70–99)
Potassium: 3.6 mmol/L (ref 3.5–5.1)
Sodium: 138 mmol/L (ref 135–145)

## 2022-09-11 ENCOUNTER — Encounter (HOSPITAL_BASED_OUTPATIENT_CLINIC_OR_DEPARTMENT_OTHER): Payer: Self-pay | Admitting: Cardiology

## 2022-09-16 ENCOUNTER — Encounter: Payer: Self-pay | Admitting: Nurse Practitioner

## 2022-09-16 ENCOUNTER — Other Ambulatory Visit: Payer: Self-pay

## 2022-09-16 ENCOUNTER — Ambulatory Visit: Payer: Self-pay | Attending: Nurse Practitioner | Admitting: Nurse Practitioner

## 2022-09-16 VITALS — BP 106/53 | HR 58 | Ht 74.0 in | Wt 292.6 lb

## 2022-09-16 DIAGNOSIS — I10 Essential (primary) hypertension: Secondary | ICD-10-CM

## 2022-09-16 DIAGNOSIS — E559 Vitamin D deficiency, unspecified: Secondary | ICD-10-CM

## 2022-09-16 MED ORDER — VITAMIN D (ERGOCALCIFEROL) 1.25 MG (50000 UNIT) PO CAPS
50000.0000 [IU] | ORAL_CAPSULE | ORAL | 1 refills | Status: AC
Start: 2022-09-16 — End: ?
  Filled 2022-09-16: qty 12, 84d supply, fill #0
  Filled 2022-12-01: qty 4, 28d supply, fill #0
  Filled 2023-01-27: qty 4, 28d supply, fill #1
  Filled 2023-02-28: qty 4, 28d supply, fill #2

## 2022-09-16 NOTE — Progress Notes (Signed)
Assessment & Plan:  Frank "Ray" was seen today for medical management of chronic issues.  Diagnoses and all orders for this visit:  Primary hypertension Continue all antihypertensives as prescribed.  Reminded to bring in blood pressure log for follow  up appointment.  RECOMMENDATIONS: DASH/Mediterranean Diets are healthier choices for HTN.    Vitamin D deficiency disease -     Vitamin D, Ergocalciferol, (DRISDOL) 1.25 MG (50000 UNIT) CAPS capsule; Take 1 capsule (50,000 Units total) by mouth every 7 (seven) days.    Patient has been counseled on age-appropriate routine health concerns for screening and prevention. These are reviewed and up-to-date. Referrals have been placed accordingly. Immunizations are up-to-date or declined.    Subjective:   Chief Complaint  Patient presents with   Medical Management of Chronic Issues   HPI Frank Love 67 y.o. male presents to office today for follow upt to HTN.  He also has a PMH of BLE edema, LLE cellulitis, CHF, CAD (he is being followed by the heart failure clinic and cardiology    Followed by vein and vascular for chronic venous insufficiency of the left lower extremity  His recent issues with LLE cellulitis are currently resolved. He does continue with mild LLE edema which is at baseline.   HTN Blood pressure on the lower side of normal today. He is asymptomatic.  BP Readings from Last 3 Encounters:  09/16/22 (!) 106/53  08/15/22 126/68  08/08/22 (!) 111/57     Hypocalcemia Taking calcium 1200 mg daily along with weekly vitamin D. Will recheck labs in a few weeks. .   Review of Systems  Constitutional:  Negative for fever, malaise/fatigue and weight loss.  HENT: Negative.  Negative for nosebleeds.   Eyes: Negative.  Negative for blurred vision, double vision and photophobia.  Respiratory: Negative.  Negative for cough and shortness of breath.   Cardiovascular:  Positive for leg swelling. Negative for chest pain  and palpitations.  Gastrointestinal: Negative.  Negative for heartburn, nausea and vomiting.  Musculoskeletal: Negative.  Negative for myalgias.  Skin:  Negative for itching and rash.  Neurological: Negative.  Negative for dizziness, focal weakness, seizures and headaches.  Psychiatric/Behavioral: Negative.  Negative for suicidal ideas.     Past Medical History:  Diagnosis Date   Cancer (HCC)    penile cancer   CHF (congestive heart failure) (HCC)    Chronic pain of right knee    Coronary artery disease    Hypertension    Phimosis     Past Surgical History:  Procedure Laterality Date   CIRCUMCISION N/A 10/12/2018   Procedure: CIRCUMCISION ADULT;  Surgeon: Marcine Matar, MD;  Location: [facility];  Service: Urology;  Laterality: N/A;  45 mins   CO2 LASER APPLICATION N/A 04/29/2019   Procedure: CO2 LASER APPLICATION;  Surgeon: Marcine Matar, MD;  Location: [facility];  Service: Urology;  Laterality: N/A;  30 MINS   COLONOSCOPY     CORONARY ARTERY BYPASS GRAFT N/A 12/12/2018   Procedure: CORONARY ARTERY BYPASS GRAFTING (CABG) x four, using bilateral internal mammary arteries and right leg greater saphenous vein harvested endoscopically;  Surgeon: Linden Dolin, MD;  Location: [facility] OR;  Service: Open Heart Surgery;  Laterality: N/A;   LEFT HEART CATH AND CORONARY ANGIOGRAPHY N/A 11/28/2018   Procedure: LEFT HEART CATH AND CORONARY ANGIOGRAPHY;  Surgeon: Kathleene Hazel, MD;  [facility];  Service: Cardiovascular;  Laterality: N/A;   MEDIASTINAL EXPLORATION N/A 12/12/2018   Procedure: Mediastinal Re-Exploration  after bypass graft;  Surgeon: Linden Dolin, MD;  Location: [facility] OR;  Service: Open Heart Surgery;  Laterality: N/A;   TEE WITHOUT CARDIOVERSION N/A 12/12/2018   Procedure: TRANSESOPHAGEAL ECHOCARDIOGRAM (TEE);  Surgeon: Linden Dolin, MD;  Location: [facility] OR;  Service: Open Heart Surgery;  Laterality: N/A;   TOOTH EXTRACTION     TOTAL HIP ARTHROPLASTY  Right 03/08/2019   Procedure: RIGHT TOTAL HIP ARTHROPLASTY ANTERIOR APPROACH;  Surgeon: Kathryne Hitch, MD;  Location: [facility];  Service: Orthopedics;  Laterality: Right;    Family History  Problem Relation Age of Onset   Diabetes Mother    Heart disease Mother     Social History Reviewed with no changes to be made today.   Outpatient Medications Prior to Visit  Medication Sig Dispense Refill   aspirin EC 81 MG tablet Take 1 tablet (81 mg total) by mouth daily. Swallow whole. (Patient taking differently: Take 81 mg by mouth at bedtime.) 90 tablet 3   atorvastatin (LIPITOR) 40 MG tablet Take 1 tablet (40 mg total) by mouth daily at 6 PM. 90 tablet 30   carvedilol (COREG) 3.125 MG tablet Take 1 tablet (3.125 mg total) by mouth 2 (two) times daily with a meal. 180 tablet 3   dapagliflozin propanediol (FARXIGA) 10 MG TABS tablet TAKE 1 TABLET (10 MG TOTAL) BY MOUTH DAILY BEFORE BREAKFAST. 90 tablet 3   furosemide (LASIX) 40 MG tablet Take 1 tablet (40 mg total) by mouth daily. 30 tablet 4   nitroGLYCERIN (NITROSTAT) 0.4 MG SL tablet Place 1 tablet (0.4 mg total) under the tongue every 5 (five) minutes as needed for chest pain. NO more than 3 pills--if you have the need to take 2 pills or more call MD 30 tablet 3   potassium chloride SA (KLOR-CON M) 20 MEQ tablet Take 1 tablet (20 mEq total) by mouth as directed. Take 1 tablet only take when taking a extra lasix dose (Patient taking differently: Take 20 mEq by mouth as directed. Take 20 mEq (1 tablet) by mouth if extra dose of furosemide is taken.) 30 tablet 0   sacubitril-valsartan (ENTRESTO) 24-26 MG Take 1 tablet by mouth 2 (two) times daily. 180 tablet 3   spironolactone (ALDACTONE) 25 MG tablet Take 1 tablet (25 mg total) by mouth daily. 30 tablet 6   Vitamin D, Ergocalciferol, (DRISDOL) 1.25 MG (50000 UNIT) CAPS capsule Take 1 capsule (50,000 Units total) by mouth every 7 (seven) days. (Patient taking differently: Take 50,000 Units by  mouth every 7 (seven) days. Pt takes every Thursday) 12 capsule 1   promethazine-dextromethorphan (PROMETHAZINE-DM) 6.25-15 MG/5ML syrup Take 5 mLs by mouth 4 (four) times daily as needed for cough. (Patient not taking: Reported on 08/15/2022) 240 mL 0   No facility-administered medications prior to visit.    No Known Allergies     Objective:    BP (!) 106/53 (BP Location: Left Arm, Patient Position: Sitting, Cuff Size: Normal)   Pulse (!) 58   Ht 6\' 2"  (1.88 m)   Wt 292 lb 9.6 oz (132.7 kg)   SpO2 95%   BMI 37.57 kg/m  Wt Readings from Last 3 Encounters:  09/16/22 292 lb 9.6 oz (132.7 kg)  08/15/22 290 lb 6.4 oz (131.7 kg)  08/08/22 290 lb 12.8 oz (131.9 kg)    Physical Exam Vitals and nursing note reviewed.  Constitutional:      Appearance: He is well-developed.  HENT:     Head: Normocephalic and atraumatic.  Cardiovascular:     Rate and Rhythm: Normal rate and regular rhythm.     Heart sounds: Normal heart sounds. No murmur heard.    No friction rub. No gallop.  Pulmonary:     Effort: Pulmonary effort is normal. No tachypnea or respiratory distress.     Breath sounds: Normal breath sounds. No decreased breath sounds, wheezing, rhonchi or rales.  Chest:     Chest wall: No tenderness.  Abdominal:     General: Bowel sounds are normal.     Palpations: Abdomen is soft.  Musculoskeletal:        General: Normal range of motion.     Cervical back: Normal range of motion.  Skin:    General: Skin is warm and dry.  Neurological:     Mental Status: He is alert and oriented to person, place, and time.     Coordination: Coordination normal.  Psychiatric:        Behavior: Behavior normal. Behavior is cooperative.        Thought Content: Thought content normal.        Judgment: Judgment normal.          Patient has been counseled extensively about nutrition and exercise as well as the importance of adherence with medications and regular follow-up. The patient was given  clear instructions to go to ER or return to medical center if symptoms don't improve, worsen or new problems develop. The patient verbalized understanding.   Follow-up: Return for 4 weeks lab appt. See me in 3 months.   Claiborne Rigg, FNP-BC [facility] [place], [place] [phone]   09/16/2022, 1:03 PM

## 2022-09-26 IMAGING — DX DG CHEST 1V PORT
1 series · 1 of 1 positions shown · non-contrast
Comparison: Radiograph 01/07/2019, CT 11/20/2018

CLINICAL DATA: Shortness of breath, suspicion of COVID

EXAM:
PORTABLE CHEST 1 VIEW

[chest ap]
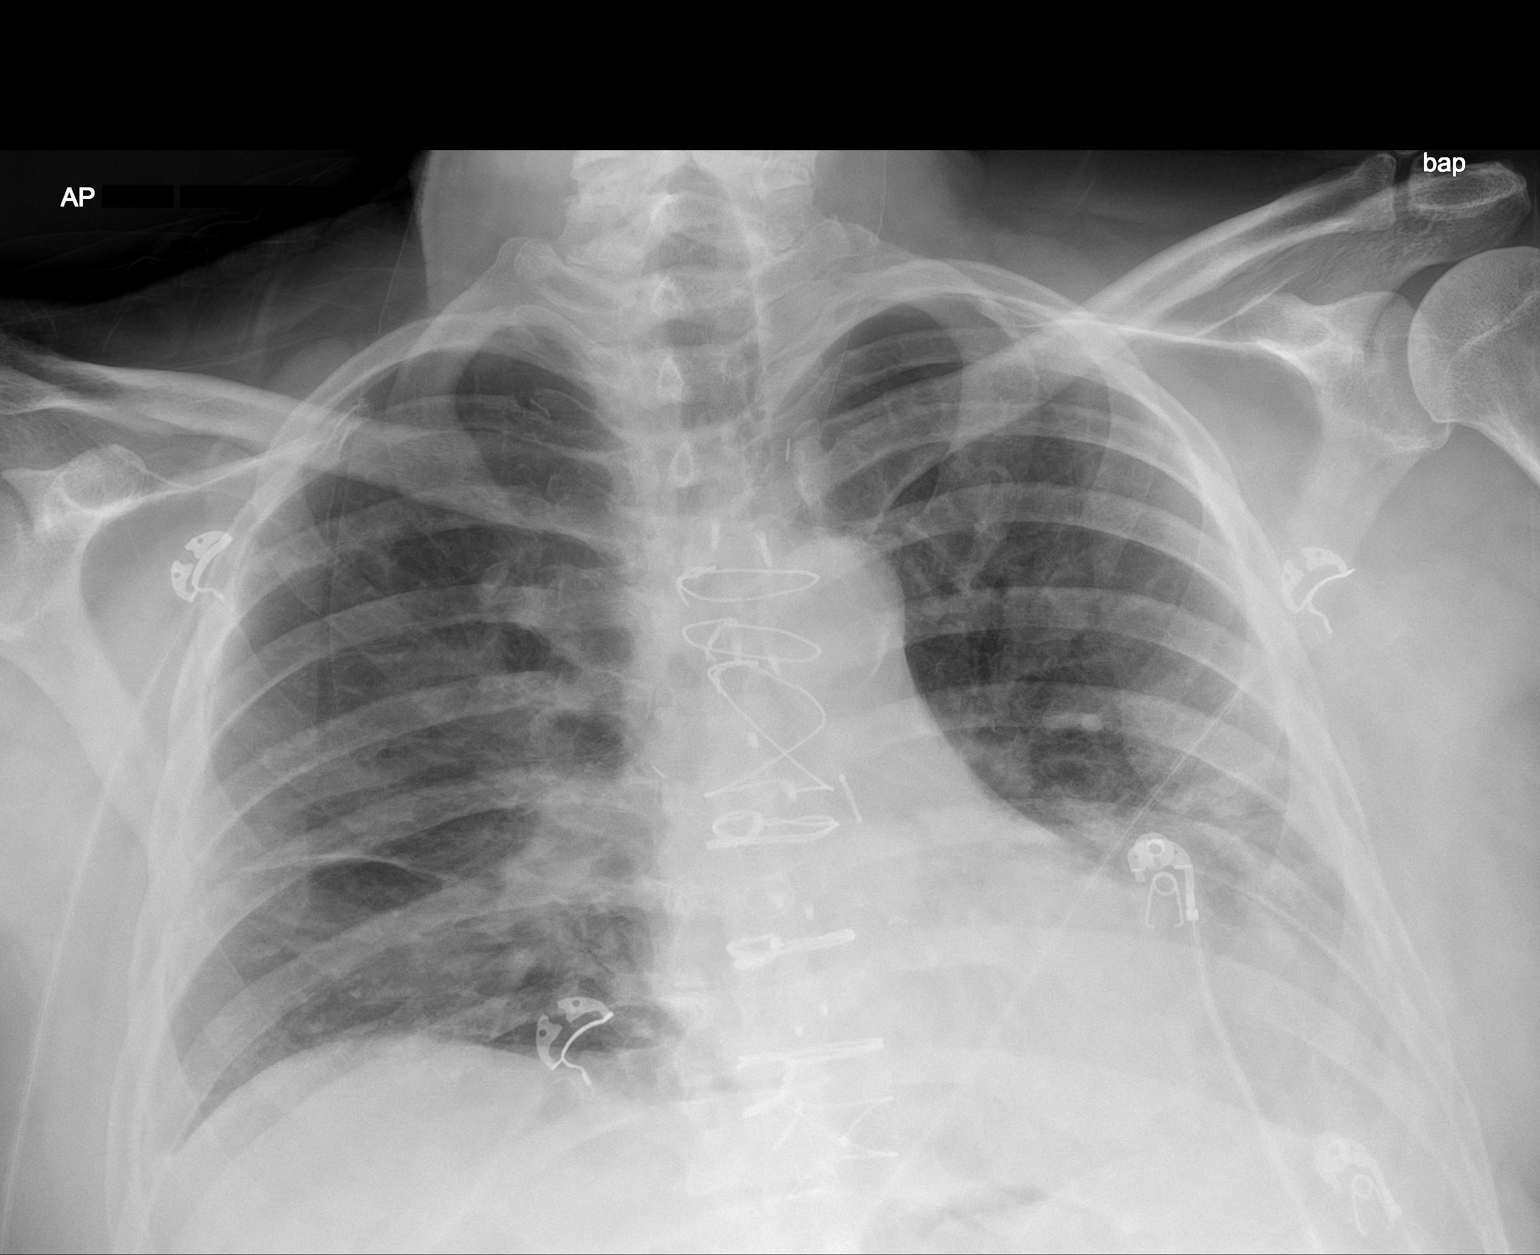

[1 of 1 positions shown; findings below may reference images not displayed]

FINDINGS: Heterogeneous consolidative and hazy opacity is present in a lower
lung and peripheral predominance most coalescent in the left lung
periphery. Some more bandlike opacities, possibly subsegmental
atelectatic changes or scarring. No pneumothorax. No visible
effusion though the costophrenic sulci are partially collimated.
Postsurgical changes related to prior CABG including intact and
aligned sternotomy wires and multiple surgical clips projecting over
the mediastinum. Telemetry leads overlie the chest. The aorta is
calcified. The remaining cardiomediastinal contours are
unremarkable. Degenerative changes are present in the imaged spine
and shoulders.
IMPRESSION: 1. Heterogeneous consolidative and hazy opacity in a lower lung and
peripheral predominance most coalescent in the left lung periphery,
compatible with a multifocal pneumonia including atypical viral
etiology in the appropriate clinical setting.
2. More bandlike opacities likely reflect subsegmental atelectatic
changes or scarring.

## 2022-10-03 ENCOUNTER — Other Ambulatory Visit (HOSPITAL_COMMUNITY): Payer: Self-pay

## 2022-10-03 ENCOUNTER — Other Ambulatory Visit (HOSPITAL_COMMUNITY): Payer: Self-pay | Admitting: Cardiology

## 2022-10-04 ENCOUNTER — Other Ambulatory Visit (HOSPITAL_COMMUNITY): Payer: Self-pay

## 2022-10-04 MED ORDER — SPIRONOLACTONE 25 MG PO TABS
25.0000 mg | ORAL_TABLET | Freq: Every day | ORAL | 6 refills | Status: DC
  Filled 2022-10-04: qty 30, 30d supply, fill #0

## 2022-10-05 ENCOUNTER — Other Ambulatory Visit (HOSPITAL_COMMUNITY): Payer: Self-pay

## 2022-10-05 MED ORDER — SPIRONOLACTONE 25 MG PO TABS
25.0000 mg | ORAL_TABLET | Freq: Every day | ORAL | 6 refills | Status: DC
  Filled 2022-10-05 – 2022-10-31 (×2): qty 30, 30d supply, fill #0
  Filled 2022-12-01: qty 30, 30d supply, fill #1
  Filled 2022-12-28: qty 30, 30d supply, fill #2
  Filled 2023-01-27: qty 30, 30d supply, fill #3
  Filled 2023-02-28: qty 30, 30d supply, fill #4
  Filled 2023-03-28: qty 30, 30d supply, fill #5
  Filled 2023-04-27: qty 30, 30d supply, fill #6

## 2022-10-05 NOTE — Addendum Note (Signed)
Addended by: Theresia Bough on: 10/05/2022 04:13 PM   Modules accepted: Orders

## 2022-10-14 ENCOUNTER — Other Ambulatory Visit: Payer: Self-pay

## 2022-10-31 ENCOUNTER — Other Ambulatory Visit: Payer: Self-pay

## 2022-10-31 ENCOUNTER — Other Ambulatory Visit (HOSPITAL_COMMUNITY): Payer: Self-pay

## 2022-11-02 ENCOUNTER — Other Ambulatory Visit: Payer: Self-pay

## 2022-11-04 ENCOUNTER — Other Ambulatory Visit: Payer: Self-pay

## 2022-11-04 ENCOUNTER — Other Ambulatory Visit (HOSPITAL_COMMUNITY): Payer: Self-pay

## 2022-11-04 ENCOUNTER — Ambulatory Visit: Payer: Self-pay | Attending: Nurse Practitioner | Admitting: Nurse Practitioner

## 2022-11-04 ENCOUNTER — Encounter: Payer: Self-pay | Admitting: Nurse Practitioner

## 2022-11-04 VITALS — BP 167/89 | HR 60 | Ht 74.0 in | Wt 292.0 lb

## 2022-11-04 DIAGNOSIS — M79601 Pain in right arm: Secondary | ICD-10-CM

## 2022-11-04 MED ORDER — DICLOFENAC SODIUM 1 % EX GEL
2.0000 g | Freq: Four times a day (QID) | CUTANEOUS | 1 refills | Status: DC
Start: 2022-11-04 — End: 2023-04-07
  Filled 2022-11-04: qty 100, 25d supply, fill #0
  Filled 2022-11-04: qty 200, 25d supply, fill #0
  Filled 2023-03-28: qty 100, 25d supply, fill #1

## 2022-11-04 NOTE — Progress Notes (Signed)
Assessment & Plan:  Frank "Love" was seen today for shoulder pain.  Diagnoses and all orders for this visit:  Right arm pain Follow up in 2 weeks if no improvement in pain -     diclofenac Sodium (VOLTAREN) 1 % GEL; Apply 2 g topically 4 (four) times daily.    Patient has been counseled on age-appropriate routine health concerns for screening and prevention. These are reviewed and up-to-date. Referrals have been placed accordingly. Immunizations are up-to-date or declined.    Subjective:   Chief Complaint  Patient presents with   Shoulder Pain    Right arm weakness.    HPI Frank Love 67 y.o. male presents to office today with right arm pain  Frank Love was working on putting curtains up in his garage port 1 week ago and accidentally tripped over a piece of carpet and fell on his right upper arm. He does have ROM of the arm but endorses pain with certain positions like putting the key in the ignition and twisting his wrist to do this causes pain to radiate up into the arm and shoulder. There is no visible bruising today and minimal pain was elicited with Passive adduction of right arm. There is no crepitus felt in the shoulder with passive ROM and he can lift the arm above shoulder height. Pain can get as high as 8/10 and lasts several seconds before it goes away on its own. Pain described as sharp and tightness.     Review of Systems  Constitutional: Negative.   Respiratory: Negative.    Cardiovascular: Negative.   Musculoskeletal:  Positive for falls, joint pain and myalgias. Negative for back pain and neck pain.  Neurological: Negative.   Endo/Heme/Allergies:  Does not bruise/bleed easily.  Psychiatric/Behavioral: Negative.      Past Medical History:  Diagnosis Date   Cancer (HCC)    penile cancer   CHF (congestive heart failure) (HCC)    Chronic pain of right knee    Coronary artery disease    Hypertension    Phimosis     Past Surgical History:   Procedure Laterality Date   CIRCUMCISION N/A 10/12/2018   Procedure: CIRCUMCISION ADULT;  Surgeon: Marcine Matar, MD;  Location: [facility];  Service: Urology;  Laterality: N/A;  45 mins   CO2 LASER APPLICATION N/A 04/29/2019   Procedure: CO2 LASER APPLICATION;  Surgeon: Marcine Matar, MD;  Location: [facility];  Service: Urology;  Laterality: N/A;  30 MINS   COLONOSCOPY     CORONARY ARTERY BYPASS GRAFT N/A 12/12/2018   Procedure: CORONARY ARTERY BYPASS GRAFTING (CABG) x four, using bilateral internal mammary arteries and right leg greater saphenous vein harvested endoscopically;  Surgeon: Linden Dolin, MD;  Location[facility];  Service: Open Heart Surgery;  Laterality: N/A;   LEFT HEART CATH AND CORONARY ANGIOGRAPHY N/A 11/28/2018   Procedure: LEFT HEART CATH AND CORONARY ANGIOGRAPHY;  Surgeon: Kathleene Hazel, MD;  [facility];  Service: Cardiovascular;  Laterality: N/A;   MEDIASTINAL EXPLORATION N/A 12/12/2018   Procedure: Mediastinal Re-Exploration after bypass graft;  Surgeon: Linden Dolin, MD;  [facility];  Service: Open Heart Surgery;  Laterality: N/A;   TEE WITHOUT CARDIOVERSION N/A 12/12/2018   Procedure: TRANSESOPHAGEAL ECHOCARDIOGRAM (TEE);  Surgeon: Linden Dolin, MD;  Location: [facility] OR;  Service: Open Heart Surgery;  Laterality: N/A;   TOOTH EXTRACTION     TOTAL HIP ARTHROPLASTY Right 03/08/2019   Procedure: RIGHT TOTAL HIP ARTHROPLASTY ANTERIOR  APPROACH;  Surgeon: Kathryne Hitch, MD;  [facility];  Service: Orthopedics;  Laterality: Right;    Family History  Problem Relation Age of Onset   Diabetes Mother    Heart disease Mother     Social History Reviewed with no changes to be made today.   Outpatient Medications Prior to Visit  Medication Sig Dispense Refill   aspirin EC 81 MG tablet Take 1 tablet (81 mg total) by mouth daily. Swallow whole. (Patient taking differently: Take 81 mg by mouth at bedtime.) 90 tablet 3    atorvastatin (LIPITOR) 40 MG tablet Take 1 tablet (40 mg total) by mouth daily at 6 PM. 90 tablet 30   carvedilol (COREG) 3.125 MG tablet Take 1 tablet (3.125 mg total) by mouth 2 (two) times daily with a meal. 180 tablet 3   dapagliflozin propanediol (FARXIGA) 10 MG TABS tablet TAKE 1 TABLET (10 MG TOTAL) BY MOUTH DAILY BEFORE BREAKFAST. 90 tablet 3   furosemide (LASIX) 40 MG tablet Take 1 tablet (40 mg total) by mouth daily. 30 tablet 4   nitroGLYCERIN (NITROSTAT) 0.4 MG SL tablet Place 1 tablet (0.4 mg total) under the tongue every 5 (five) minutes as needed for chest pain. NO more than 3 pills--if you have the need to take 2 pills or more call MD 30 tablet 3   potassium chloride SA (KLOR-CON M) 20 MEQ tablet Take 1 tablet (20 mEq total) by mouth as directed. Take 1 tablet only take when taking a extra lasix dose (Patient taking differently: Take 20 mEq by mouth as directed. Take 20 mEq (1 tablet) by mouth if extra dose of furosemide is taken.) 30 tablet 0   sacubitril-valsartan (ENTRESTO) 24-26 MG Take 1 tablet by mouth 2 (two) times daily. 180 tablet 3   spironolactone (ALDACTONE) 25 MG tablet Take 1 tablet (25 mg total) by mouth daily. 30 tablet 6   Vitamin D, Ergocalciferol, (DRISDOL) 1.25 MG (50000 UNIT) CAPS capsule Take 1 capsule (50,000 Units total) by mouth every 7 (seven) days. 12 capsule 1   No facility-administered medications prior to visit.    No Known Allergies     Objective:    BP (!) 167/89   Pulse 60   Ht 6\' 2"  (1.88 m)   Wt 292 lb (132.5 kg)   SpO2 98%   BMI 37.49 kg/m  Wt Readings from Last 3 Encounters:  11/04/22 292 lb (132.5 kg)  09/16/22 292 lb 9.6 oz (132.7 kg)  08/15/22 290 lb 6.4 oz (131.7 kg)    Physical Exam Vitals and nursing note reviewed.  Constitutional:      Appearance: He is well-developed.  HENT:     Head: Normocephalic and atraumatic.  Cardiovascular:     Rate and Rhythm: Normal rate and regular rhythm.     Heart sounds: Normal heart  sounds. No murmur heard.    No friction rub. No gallop.  Pulmonary:     Effort: Pulmonary effort is normal. No tachypnea or respiratory distress.     Breath sounds: Normal breath sounds. No decreased breath sounds, wheezing, rhonchi or rales.  Chest:     Chest wall: No tenderness.  Abdominal:     General: Bowel sounds are normal.  Musculoskeletal:        General: No deformity or edema.     Right shoulder: Tenderness present. No swelling, deformity, effusion, laceration, bony tenderness or crepitus. Decreased range of motion. Normal strength. Normal pulse.     Left shoulder: Normal.  Right upper arm: Tenderness present. No swelling, edema, deformity, lacerations or bony tenderness.     Left upper arm: Normal.     Cervical back: Normal range of motion.  Skin:    General: Skin is warm and dry.  Neurological:     Mental Status: He is alert and oriented to person, place, and time.     Coordination: Coordination normal.     Gait: Gait normal.  Psychiatric:        Mood and Affect: Mood and affect normal.        Speech: Speech normal.        Behavior: Behavior normal. Behavior is cooperative.        Thought Content: Thought content normal. Thought content does not include homicidal or suicidal plan.        Judgment: Judgment normal.          Patient has been counseled extensively about nutrition and exercise as well as the importance of adherence with medications and regular follow-up. The patient was given clear instructions to go to ER or return to medical center if symptoms don't improve, worsen or new problems develop. The patient verbalized understanding.   Follow-up: No follow-ups on file.   Claiborne Rigg, FNP-BC [facility] [place], [place] [phone]   11/04/2022, 5:38 PM

## 2022-11-14 ENCOUNTER — Other Ambulatory Visit (HOSPITAL_COMMUNITY): Payer: Self-pay

## 2022-11-17 ENCOUNTER — Other Ambulatory Visit (HOSPITAL_COMMUNITY): Payer: Self-pay

## 2022-12-01 ENCOUNTER — Other Ambulatory Visit: Payer: Self-pay

## 2022-12-01 ENCOUNTER — Other Ambulatory Visit (HOSPITAL_COMMUNITY): Payer: Self-pay

## 2022-12-13 ENCOUNTER — Telehealth (HOSPITAL_COMMUNITY): Payer: Self-pay | Admitting: Pharmacy Technician

## 2022-12-13 ENCOUNTER — Other Ambulatory Visit (HOSPITAL_COMMUNITY): Payer: Self-pay

## 2022-12-13 NOTE — Telephone Encounter (Signed)
Advanced Heart Failure Patient Advocate Encounter  Received notification that patient is only receiving a 30 day supply of Entresto. [employer], representative stated that the patient was sent that amount due to enrollment ending 01/24/23. The company has always in the past sent 90 days no matter the expiration. They have changed that this year and will only be sending out 30 day supplies for patients whose enrollment ends in December.

## 2022-12-14 ENCOUNTER — Telehealth (HOSPITAL_COMMUNITY): Payer: Self-pay | Admitting: Pharmacy Technician

## 2022-12-14 NOTE — Telephone Encounter (Signed)
Advanced Heart Failure Patient Advocate Encounter  Sent in [place] assistance renewal application and POI to [employer] via fax.   Will follow up.

## 2022-12-14 NOTE — Telephone Encounter (Signed)
Advanced Heart Failure Patient Advocate Encounter  Sent in [place] assistance renewal application to [employer] via fax.  Will follow up.

## 2022-12-19 ENCOUNTER — Ambulatory Visit: Payer: Self-pay | Admitting: Nurse Practitioner

## 2022-12-19 MED ORDER — DAPAGLIFLOZIN PROPANEDIOL 10 MG PO TABS: ORAL_TABLET | Freq: Every day | ORAL | 3 refills | Status: AC

## 2022-12-19 NOTE — Telephone Encounter (Signed)
Advanced Heart Failure Patient Advocate Encounter   Patient was approved to receive Entresto from [employer]  Effective dates: 12/14/22 through 01/24/24  Document scanned to chart.

## 2022-12-28 ENCOUNTER — Other Ambulatory Visit (HOSPITAL_COMMUNITY): Payer: Self-pay

## 2023-01-27 ENCOUNTER — Other Ambulatory Visit: Payer: Self-pay

## 2023-01-27 ENCOUNTER — Other Ambulatory Visit (HOSPITAL_COMMUNITY): Payer: Self-pay

## 2023-01-31 ENCOUNTER — Other Ambulatory Visit (HOSPITAL_COMMUNITY): Payer: Self-pay

## 2023-02-16 ENCOUNTER — Telehealth: Payer: Self-pay | Admitting: Cardiology

## 2023-02-16 DIAGNOSIS — I6523 Occlusion and stenosis of bilateral carotid arteries: Secondary | ICD-10-CM

## 2023-02-16 NOTE — Telephone Encounter (Signed)
Appt was made for pt on 03/10/23 for a Carotid Duplex without talking to him and he will be out of town. Order expires on 03/10/23. He wants to do test the first week of March. Will need a new order put in.

## 2023-02-16 NOTE — Telephone Encounter (Signed)
Noted  New order placed

## 2023-02-28 ENCOUNTER — Other Ambulatory Visit (HOSPITAL_COMMUNITY): Payer: Self-pay

## 2023-02-28 ENCOUNTER — Other Ambulatory Visit: Payer: Self-pay

## 2023-03-02 ENCOUNTER — Other Ambulatory Visit (HOSPITAL_COMMUNITY): Payer: Self-pay

## 2023-03-03 ENCOUNTER — Ambulatory Visit: Payer: Self-pay | Admitting: Nurse Practitioner

## 2023-03-10 ENCOUNTER — Encounter (HOSPITAL_COMMUNITY): Payer: Self-pay

## 2023-03-28 ENCOUNTER — Other Ambulatory Visit: Payer: Self-pay

## 2023-03-28 ENCOUNTER — Encounter (HOSPITAL_COMMUNITY): Payer: Self-pay

## 2023-03-28 ENCOUNTER — Other Ambulatory Visit: Payer: Self-pay | Admitting: Nurse Practitioner

## 2023-03-28 ENCOUNTER — Other Ambulatory Visit (HOSPITAL_COMMUNITY): Payer: Self-pay

## 2023-03-28 ENCOUNTER — Other Ambulatory Visit (HOSPITAL_COMMUNITY): Payer: Self-pay | Admitting: Cardiology

## 2023-03-28 DIAGNOSIS — E559 Vitamin D deficiency, unspecified: Secondary | ICD-10-CM

## 2023-03-29 ENCOUNTER — Other Ambulatory Visit (HOSPITAL_COMMUNITY): Payer: Self-pay

## 2023-03-29 MED ORDER — FUROSEMIDE 40 MG PO TABS
40.0000 mg | ORAL_TABLET | Freq: Every day | ORAL | 4 refills | Status: DC
  Filled 2023-03-29: qty 30, 30d supply, fill #0
  Filled 2023-04-27: qty 30, 30d supply, fill #1
  Filled 2023-05-28: qty 30, 30d supply, fill #2
  Filled 2023-06-28: qty 30, 30d supply, fill #3
  Filled 2023-08-27: qty 30, 30d supply, fill #4

## 2023-03-30 ENCOUNTER — Ambulatory Visit (HOSPITAL_COMMUNITY)
Admission: RE | Admit: 2023-03-30 | Payer: Self-pay | Source: Ambulatory Visit | Attending: Cardiology | Admitting: Cardiology

## 2023-04-07 ENCOUNTER — Ambulatory Visit: Payer: Self-pay | Attending: Nurse Practitioner | Admitting: Nurse Practitioner

## 2023-04-07 ENCOUNTER — Encounter: Payer: Self-pay | Admitting: Nurse Practitioner

## 2023-04-07 ENCOUNTER — Other Ambulatory Visit: Payer: Self-pay

## 2023-04-07 ENCOUNTER — Other Ambulatory Visit (HOSPITAL_COMMUNITY): Payer: Self-pay

## 2023-04-07 VITALS — BP 106/63 | HR 58 | Resp 19 | Ht 74.0 in | Wt 269.4 lb

## 2023-04-07 DIAGNOSIS — M25562 Pain in left knee: Secondary | ICD-10-CM

## 2023-04-07 DIAGNOSIS — D649 Anemia, unspecified: Secondary | ICD-10-CM

## 2023-04-07 DIAGNOSIS — E559 Vitamin D deficiency, unspecified: Secondary | ICD-10-CM

## 2023-04-07 DIAGNOSIS — I10 Essential (primary) hypertension: Secondary | ICD-10-CM

## 2023-04-07 MED ORDER — DICLOFENAC SODIUM 1 % EX GEL
2.0000 g | Freq: Four times a day (QID) | CUTANEOUS | 1 refills | Status: DC
  Filled 2023-04-07: qty 100, 13d supply, fill #0
  Filled 2023-04-07: qty 200, 30d supply, fill #0
  Filled 2023-04-27: qty 100, 13d supply, fill #1
  Filled 2023-05-14: qty 100, 13d supply, fill #2
  Filled 2023-05-28: qty 100, 13d supply, fill #3

## 2023-04-07 NOTE — Progress Notes (Signed)
 Assessment & Plan:  Frank "Ray" was seen today for medical management of chronic issues.  Diagnoses and all orders for this visit:  Primary hypertension -     CMP14+EGFR Continue all antihypertensives as prescribed.  Reminded to bring in blood pressure log for follow  up appointment.  RECOMMENDATIONS: DASH/Mediterranean Diets are healthier choices for HTN.    Left knee pain -     diclofenac Sodium (VOLTAREN) 1 % GEL; Apply 2 g topically 4 (four) times daily.  Vitamin D deficiency -     VITAMIN D 25 Hydroxy (Vit-D Deficiency, Fractures)  Anemia, unspecified type I am concerned about anemia. Will need colonoscopy if continues.  -     CBC with Differential    Patient has been counseled on age-appropriate routine health concerns for screening and prevention. These are reviewed and up-to-date. Referrals have been placed accordingly. Immunizations are up-to-date or declined.    Subjective:   Chief Complaint  Patient presents with   Medical Management of Chronic Issues    Frank Love 68 y.o. male presents to office today for follow up to HTN  He also has a PMH of BLE edema, LLE cellulitis, CHF, CAD (he is being followed by the heart failure clinic and cardiology    Followed by vein and vascular for chronic venous insufficiency of the left lower extremity  Notes left knee weakness over the past few weeks. Feels like his knee gives out sometimes. Pain has decreased since he started using voltaren gel. Denies any soft tissue swelling, falls or injury  HTN Blood pressure is well controlled. States he feels good today.  BP Readings from Last 3 Encounters:  04/07/23 106/63  11/04/22 (!) 167/89  09/16/22 (!) 106/53    Review of Systems  Constitutional:  Negative for fever, malaise/fatigue and weight loss.  HENT: Negative.  Negative for nosebleeds.   Eyes: Negative.  Negative for blurred vision, double vision and photophobia.  Respiratory: Negative.  Negative for cough  and shortness of breath.   Cardiovascular: Negative.  Negative for chest pain, palpitations and leg swelling.  Gastrointestinal: Negative.  Negative for heartburn, nausea and vomiting.  Musculoskeletal:  Positive for joint pain. Negative for myalgias.  Neurological: Negative.  Negative for dizziness, focal weakness, seizures and headaches.  Psychiatric/Behavioral: Negative.  Negative for suicidal ideas.     Past Medical History:  Diagnosis Date   Cancer (HCC)    penile cancer   CHF (congestive heart failure) (HCC)    Chronic pain of right knee    Coronary artery disease    Hypertension    Phimosis     Past Surgical History:  Procedure Laterality Date   CIRCUMCISION N/A 10/12/2018   Procedure: CIRCUMCISION ADULT;  Surgeon: Marcine Matar, MD;  Location: [facility];  Service: Urology;  Laterality: N/A;  45 mins   CO2 LASER APPLICATION N/A 04/29/2019   Procedure: CO2 LASER APPLICATION;  Surgeon: Marcine Matar, MD;  [facility]: [facility];  Service: Urology;  Laterality: N/A;  30 MINS   COLONOSCOPY     CORONARY ARTERY BYPASS GRAFT N/A 12/12/2018   Procedure: CORONARY ARTERY BYPASS GRAFTING (CABG) x four, using bilateral internal mammary arteries and right leg greater saphenous vein harvested endoscopically;  Surgeon: Linden Dolin, MD;  [facility]: [facility];  Service: Open Heart Surgery;  Laterality: N/A;   LEFT HEART CATH AND CORONARY ANGIOGRAPHY N/A 11/28/2018   Procedure: LEFT HEART CATH AND CORONARY ANGIOGRAPHY;  Surgeon: Kathleene Hazel, MD;  [facility];  Service: Cardiovascular;  Laterality: N/A;   MEDIASTINAL EXPLORATION N/A 12/12/2018   Procedure: Mediastinal Re-Exploration after bypass graft;  Surgeon: Linden Dolin, MD;  [facility]: [facility];  Service: Open Heart Surgery;  Laterality: N/A;   TEE WITHOUT CARDIOVERSION N/A 12/12/2018   Procedure: TRANSESOPHAGEAL ECHOCARDIOGRAM (TEE);  Surgeon: Linden Dolin, MD;  Location: [facility] OR;  Service: Open Heart  Surgery;  Laterality: N/A;   TOOTH EXTRACTION     TOTAL HIP ARTHROPLASTY Right 03/08/2019   Procedure: RIGHT TOTAL HIP ARTHROPLASTY ANTERIOR APPROACH;  Surgeon: Kathryne Hitch, MD;  [facility]: [facility];  Service: Orthopedics;  Laterality: Right;    Family History  Problem Relation Age of Onset   Diabetes Mother    Heart disease Mother     Social History Reviewed with no changes to be made today.   Outpatient Medications Prior to Visit  Medication Sig Dispense Refill   aspirin EC 81 MG tablet Take 1 tablet (81 mg total) by mouth daily. Swallow whole. (Patient taking differently: Take 81 mg by mouth at bedtime.) 90 tablet 3   atorvastatin (LIPITOR) 40 MG tablet Take 1 tablet (40 mg total) by mouth daily at 6 PM. 90 tablet 30   carvedilol (COREG) 3.125 MG tablet Take 1 tablet (3.125 mg total) by mouth 2 (two) times daily with a meal. 180 tablet 3   dapagliflozin propanediol (FARXIGA) 10 MG TABS tablet TAKE 1 TABLET (10 MG TOTAL) BY MOUTH DAILY BEFORE BREAKFAST. 90 tablet 3   furosemide (LASIX) 40 MG tablet Take 1 tablet (40 mg total) by mouth daily. 30 tablet 4   nitroGLYCERIN (NITROSTAT) 0.4 MG SL tablet Place 1 tablet (0.4 mg total) under the tongue every 5 (five) minutes as needed for chest pain. NO more than 3 pills--if you have the need to take 2 pills or more call MD 30 tablet 3   potassium chloride SA (KLOR-CON M) 20 MEQ tablet Take 1 tablet (20 mEq total) by mouth as directed. Take 1 tablet only take when taking a extra lasix dose (Patient taking differently: Take 20 mEq by mouth as directed. Take 20 mEq (1 tablet) by mouth if extra dose of furosemide is taken.) 30 tablet 0   sacubitril-valsartan (ENTRESTO) 24-26 MG Take 1 tablet by mouth 2 (two) times daily. 180 tablet 3   spironolactone (ALDACTONE) 25 MG tablet Take 1 tablet (25 mg total) by mouth daily. 30 tablet 6   Vitamin D, Ergocalciferol, (DRISDOL) 1.25 MG (50000 UNIT) CAPS capsule Take 1 capsule (50,000 Units total) by  mouth every 7 (seven) days. 12 capsule 1   diclofenac Sodium (VOLTAREN) 1 % GEL Apply 2 g topically 4 (four) times daily. 200 g 1   No facility-administered medications prior to visit.    No Known Allergies     Objective:    BP 106/63 (BP Location: Left Arm, Patient Position: Sitting, Cuff Size: Normal)   Pulse (!) 58   Resp 19   Ht 6\' 2"  (1.88 m)   Wt 269 lb 6.4 oz (122.2 kg)   SpO2 100%   BMI 34.59 kg/m  Wt Readings from Last 3 Encounters:  04/07/23 269 lb 6.4 oz (122.2 kg)  11/04/22 292 lb (132.5 kg)  09/16/22 292 lb 9.6 oz (132.7 kg)    Physical Exam Vitals and nursing note reviewed.  Constitutional:      Appearance: He is well-developed.  HENT:     Head: Normocephalic and atraumatic.  Cardiovascular:     Rate and  Rhythm: Normal rate and regular rhythm.     Heart sounds: Normal heart sounds. No murmur heard.    No friction rub. No gallop.  Pulmonary:     Effort: Pulmonary effort is normal. No tachypnea or respiratory distress.     Breath sounds: Normal breath sounds. No decreased breath sounds, wheezing, rhonchi or rales.  Chest:     Chest wall: No tenderness.  Abdominal:     General: Bowel sounds are normal.     Palpations: Abdomen is soft.  Musculoskeletal:        General: Normal range of motion.     Cervical back: Normal range of motion.     Comments: Uses cane to assist with mobility  Skin:    General: Skin is warm and dry.  Neurological:     Mental Status: He is alert and oriented to person, place, and time.     Coordination: Coordination normal.  Psychiatric:        Behavior: Behavior normal. Behavior is cooperative.        Thought Content: Thought content normal.        Judgment: Judgment normal.          Patient has been counseled extensively about nutrition and exercise as well as the importance of adherence with medications and regular follow-up. The patient was given clear instructions to go to ER or return to medical center if symptoms  don't improve, worsen or new problems develop. The patient verbalized understanding.   Follow-up: Return in about 3 months (around 07/08/2023).   Claiborne Rigg, FNP-BC [facility] [place], [place] [phone]   04/07/2023, 11:46 AM

## 2023-04-08 LAB — CMP14+EGFR
ALT: 16 IU/L (ref 0–44)
AST: 22 IU/L (ref 0–40)
Albumin: 4.2 g/dL (ref 3.9–4.9)
Alkaline Phosphatase: 82 IU/L (ref 44–121)
BUN/Creatinine Ratio: 23 (ref 10–24)
BUN: 34 mg/dL — ABNORMAL HIGH (ref 8–27)
Bilirubin Total: 0.9 mg/dL (ref 0.0–1.2)
CO2: 20 mmol/L (ref 20–29)
Calcium: 8.8 mg/dL (ref 8.6–10.2)
Chloride: 105 mmol/L (ref 96–106)
Creatinine, Ser: 1.48 mg/dL — ABNORMAL HIGH (ref 0.76–1.27)
Globulin, Total: 2.9 g/dL (ref 1.5–4.5)
Glucose: 104 mg/dL — ABNORMAL HIGH (ref 70–99)
Potassium: 4.4 mmol/L (ref 3.5–5.2)
Sodium: 141 mmol/L (ref 134–144)
Total Protein: 7.1 g/dL (ref 6.0–8.5)
eGFR: 52 mL/min/{1.73_m2} — ABNORMAL LOW (ref >59)

## 2023-04-08 LAB — CBC WITH DIFFERENTIAL/PLATELET
Basophils Absolute: 0.1 10*3/uL (ref 0.0–0.2)
Basos: 1 %
EOS (ABSOLUTE): 0.4 10*3/uL (ref 0.0–0.4)
Eos: 8 %
Hematocrit: 44 % (ref 37.5–51.0)
Hemoglobin: 14.4 g/dL (ref 13.0–17.7)
Immature Grans (Abs): 0 10*3/uL (ref 0.0–0.1)
Immature Granulocytes: 0 %
Lymphocytes Absolute: 1 10*3/uL (ref 0.7–3.1)
Lymphs: 19 %
MCH: 28.7 pg (ref 26.6–33.0)
MCHC: 32.7 g/dL (ref 31.5–35.7)
MCV: 88 fL (ref 79–97)
Monocytes Absolute: 0.4 10*3/uL (ref 0.1–0.9)
Monocytes: 7 %
Neutrophils Absolute: 3.5 10*3/uL (ref 1.4–7.0)
Neutrophils: 65 %
Platelets: 166 10*3/uL (ref 150–450)
RBC: 5.02 x10E6/uL (ref 4.14–5.80)
RDW: 13.6 % (ref 11.6–15.4)
WBC: 5.4 10*3/uL (ref 3.4–10.8)

## 2023-04-08 LAB — VITAMIN D 25 HYDROXY (VIT D DEFICIENCY, FRACTURES): Vit D, 25-Hydroxy: 48 ng/mL (ref 30.0–100.0)

## 2023-04-09 ENCOUNTER — Encounter: Payer: Self-pay | Admitting: Nurse Practitioner

## 2023-04-09 ENCOUNTER — Other Ambulatory Visit: Payer: Self-pay | Admitting: Nurse Practitioner

## 2023-04-09 DIAGNOSIS — R7989 Other specified abnormal findings of blood chemistry: Secondary | ICD-10-CM

## 2023-04-27 ENCOUNTER — Other Ambulatory Visit (HOSPITAL_COMMUNITY): Payer: Self-pay

## 2023-05-03 ENCOUNTER — Other Ambulatory Visit (HOSPITAL_COMMUNITY): Payer: Self-pay

## 2023-05-04 ENCOUNTER — Encounter: Payer: Self-pay | Admitting: Nurse Practitioner

## 2023-05-06 ENCOUNTER — Other Ambulatory Visit: Payer: Self-pay | Admitting: Nurse Practitioner

## 2023-05-06 DIAGNOSIS — G8929 Other chronic pain: Secondary | ICD-10-CM

## 2023-05-11 ENCOUNTER — Ambulatory Visit
Admission: RE | Admit: 2023-05-11 | Discharge: 2023-05-11 | Disposition: A | Discharge status: Home / Self Care | Payer: Self-pay | Source: Ambulatory Visit | Attending: Nurse Practitioner | Admitting: Nurse Practitioner

## 2023-05-11 DIAGNOSIS — G8929 Other chronic pain: Secondary | ICD-10-CM

## 2023-05-15 ENCOUNTER — Other Ambulatory Visit: Payer: Self-pay | Admitting: Nurse Practitioner

## 2023-05-15 ENCOUNTER — Other Ambulatory Visit (HOSPITAL_COMMUNITY): Payer: Self-pay

## 2023-05-15 ENCOUNTER — Encounter: Payer: Self-pay | Admitting: Nurse Practitioner

## 2023-05-15 DIAGNOSIS — G8929 Other chronic pain: Secondary | ICD-10-CM

## 2023-05-15 DIAGNOSIS — M1712 Unilateral primary osteoarthritis, left knee: Secondary | ICD-10-CM

## 2023-05-24 ENCOUNTER — Ambulatory Visit: Payer: Self-pay | Admitting: Orthopaedic Surgery

## 2023-05-28 ENCOUNTER — Other Ambulatory Visit (HOSPITAL_COMMUNITY): Payer: Self-pay | Admitting: Cardiology

## 2023-05-28 ENCOUNTER — Other Ambulatory Visit (HOSPITAL_COMMUNITY): Payer: Self-pay

## 2023-05-29 ENCOUNTER — Other Ambulatory Visit: Payer: Self-pay

## 2023-05-30 ENCOUNTER — Ambulatory Visit (INDEPENDENT_AMBULATORY_CARE_PROVIDER_SITE_OTHER): Payer: Self-pay | Admitting: Orthopaedic Surgery

## 2023-05-30 VITALS — Ht 73.0 in | Wt 302.0 lb

## 2023-05-30 DIAGNOSIS — M1712 Unilateral primary osteoarthritis, left knee: Secondary | ICD-10-CM

## 2023-05-30 NOTE — Progress Notes (Signed)
 Office Visit Note   Patient: Frank Love           Date of Birth: 08-Dec-1955           MRN: [id number] Visit Date: 05/30/2023              Requested by: Collins Dean, NP [address] [place],  [place] [address] PCP: Collins Dean, NP   Assessment & Plan: Visit Diagnoses:  1. Primary osteoarthritis of left knee     History of Present Illness Frank Love "Frank Love" is a 68 year old male who presents with left knee pain and buckling.  He experiences intermittent left knee pain for two months without preceding injury. He uses a cane to alleviate pressure but can walk without it. Episodes of knee buckling began suddenly, occurring twice in quick succession. The knee gives way slightly, causing concern. Morning stiffness is present, requiring time to mobilize. He has difficulty with stairs due to the knee issue. He is not taking medication for the knee and has not undergone prior treatments or diagnostic studies.  Physical Exam EXTREMITIES: Left knee extension full, flexion to 100 degrees. Left knee ligaments intact. SKIN: Venous stasis changes present.  Results RADIOLOGY Left knee X-Frank Love: Mild-to-moderate patellofemoral and medial compartment osteoarthritis.  Assessment and Plan Knee osteoarthritis Chronic left knee osteoarthritis with bone spurring and joint space narrowing. Ligaments intact. Emphasized weight loss to reduce knee pressure. - Provide knee strengthening exercise program. - Advise weight loss. - Offer cortisone injection if pain develops.  Follow-Up Instructions: No follow-ups on file.   Orders:  No orders of the defined types were placed in this encounter.  No orders of the defined types were placed in this encounter.   Subjective: Chief Complaint  Patient presents with   Left Knee - Pain     Review of Systems  Constitutional: Negative.   HENT: Negative.    Eyes: Negative.   Respiratory: Negative.    Cardiovascular:  Negative.   Gastrointestinal: Negative.   Endocrine: Negative.   Genitourinary: Negative.   Skin: Negative.   Allergic/Immunologic: Negative.   Neurological: Negative.   Hematological: Negative.   Psychiatric/Behavioral: Negative.    All other systems reviewed and are negative.    Objective: Vital Signs: Ht 6\' 1"  (1.854 m)   Wt (!) 302 lb (137 kg)   BMI 39.84 kg/m   Physical Exam Vitals and nursing note reviewed.  Constitutional:      Appearance: He is well-developed.  HENT:     Head: Normocephalic and atraumatic.  Eyes:     Pupils: Pupils are equal, round, and reactive to light.  Pulmonary:     Effort: Pulmonary effort is normal.  Abdominal:     Palpations: Abdomen is soft.  Musculoskeletal:        General: Normal range of motion.     Cervical back: Neck supple.  Skin:    General: Skin is warm.  Neurological:     Mental Status: He is alert and oriented to person, place, and time.  Psychiatric:        Behavior: Behavior normal.        Thought Content: Thought content normal.        Judgment: Judgment normal.    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   Coronary artery disease 12/12/2018   Coronary artery disease involving native coronary artery of native heart without angina pectoris    Ischemic cardiomyopathy    Chronic systolic CHF (congestive heart failure) (HCC) 10/28/2018   Preop cardiovascular exam 10/28/2018    Abnormal EKG 10/28/2018   Past Medical History:  Diagnosis Date   Cancer (HCC)    penile cancer   CHF (congestive heart failure) (HCC)    Chronic pain of right knee    Coronary artery disease    Hypertension    Phimosis     Family History  Problem Relation Age of Onset   Diabetes Mother    Heart disease Mother     Past Surgical History:  Procedure Laterality Date   CIRCUMCISION N/A 10/12/2018   Procedure: CIRCUMCISION ADULT;  Surgeon: Trent Frizzle, MD;  Location: [facility];  Service: Urology;  Laterality: N/A;  45 mins   CO2 LASER APPLICATION N/A 04/29/2019   Procedure: CO2 LASER APPLICATION;  Surgeon: Trent Frizzle, MD;  [facility]: [facility];  Service: Urology;  Laterality: N/A;  30 MINS   COLONOSCOPY     CORONARY ARTERY BYPASS GRAFT N/A 12/12/2018   Procedure: CORONARY ARTERY BYPASS GRAFTING (CABG) x four, using bilateral internal mammary arteries and right leg greater saphenous vein harvested endoscopically;  Surgeon: Rudine Cos, MD;  [facility];  Service: Open Heart Surgery;  Laterality: N/A;   LEFT HEART CATH AND CORONARY ANGIOGRAPHY N/A 11/28/2018   Procedure: LEFT HEART CATH AND CORONARY ANGIOGRAPHY;  Surgeon: Odie Benne, MD;  [facility];  Service: Cardiovascular;  Laterality: N/A;   MEDIASTINAL EXPLORATION N/A 12/12/2018   Procedure: Mediastinal Re-Exploration after bypass graft;  Surgeon: Rudine Cos, MD;  [facility];  Service: Open Heart Surgery;  Laterality: N/A;   TEE WITHOUT CARDIOVERSION N/A 12/12/2018   Procedure: TRANSESOPHAGEAL ECHOCARDIOGRAM (TEE);  Surgeon: Rudine Cos, MD;  Location: [facility] OR;  Service: Open Heart Surgery;  Laterality: N/A;   TOOTH EXTRACTION     TOTAL HIP ARTHROPLASTY Right 03/08/2019   Procedure: RIGHT TOTAL HIP ARTHROPLASTY ANTERIOR APPROACH;  Surgeon: Arnie Lao, MD;  Location: [facility];  Service: Orthopedics;  Laterality: Right;   Social History   Occupational History    Not on file  Tobacco Use   Smoking status: Never   Smokeless tobacco: Never  Vaping Use   Vaping status: Never Used  Substance and Sexual Activity   Alcohol use: No    Alcohol/week: 0.0 standard drinks of alcohol   Drug use: No   Sexual activity: Yes

## 2023-05-31 ENCOUNTER — Other Ambulatory Visit (HOSPITAL_COMMUNITY): Payer: Self-pay

## 2023-05-31 MED ORDER — SPIRONOLACTONE 25 MG PO TABS
25.0000 mg | ORAL_TABLET | Freq: Every day | ORAL | 6 refills | Status: DC
  Filled 2023-05-31: qty 30, 30d supply, fill #0
  Filled 2023-06-28: qty 30, 30d supply, fill #1
  Filled 2023-07-25: qty 30, 30d supply, fill #2
  Filled 2023-08-27: qty 30, 30d supply, fill #3
  Filled 2023-09-27: qty 30, 30d supply, fill #4
  Filled 2023-10-25: qty 30, 30d supply, fill #5
  Filled 2023-11-24: qty 30, 30d supply, fill #6

## 2023-06-01 ENCOUNTER — Other Ambulatory Visit (HOSPITAL_COMMUNITY): Payer: Self-pay

## 2023-06-20 ENCOUNTER — Ambulatory Visit: Payer: Self-pay | Admitting: Orthopaedic Surgery

## 2023-06-22 ENCOUNTER — Encounter: Payer: Self-pay | Admitting: Cardiology

## 2023-06-26 ENCOUNTER — Encounter (HOSPITAL_COMMUNITY): Payer: Self-pay

## 2023-06-28 ENCOUNTER — Other Ambulatory Visit: Payer: Self-pay | Admitting: Nurse Practitioner

## 2023-06-28 DIAGNOSIS — M25562 Pain in left knee: Secondary | ICD-10-CM

## 2023-06-29 ENCOUNTER — Other Ambulatory Visit: Payer: Self-pay

## 2023-06-29 ENCOUNTER — Other Ambulatory Visit (HOSPITAL_COMMUNITY): Payer: Self-pay

## 2023-06-29 MED ORDER — DICLOFENAC SODIUM 1 % EX GEL
2.0000 g | Freq: Four times a day (QID) | CUTANEOUS | 1 refills | Status: AC
  Filled 2023-06-29: qty 200, 30d supply, fill #0

## 2023-07-14 ENCOUNTER — Ambulatory Visit: Payer: Self-pay | Admitting: Nurse Practitioner

## 2023-07-24 ENCOUNTER — Telehealth: Payer: Self-pay

## 2023-07-24 NOTE — Telephone Encounter (Signed)
 Copied from CRM [id number]. Topic: General - Other >> Jul 24, 2023 10:29 AM Delon HERO wrote: Reason for CRM: Patient is calling to follow up on the paper work that he dropped off about 2-3 weeks ago to renew his handicap decal that expires today. Please advise

## 2023-07-24 NOTE — Telephone Encounter (Signed)
 Form completed and nurse will call patient for pick upt

## 2023-07-24 NOTE — Telephone Encounter (Signed)
Unable to reach patient by phone.  Voicemail left. 

## 2023-07-25 ENCOUNTER — Other Ambulatory Visit (HOSPITAL_COMMUNITY): Payer: Self-pay

## 2023-07-25 ENCOUNTER — Encounter (HOSPITAL_COMMUNITY): Payer: Self-pay

## 2023-07-25 ENCOUNTER — Other Ambulatory Visit: Payer: Self-pay

## 2023-07-25 ENCOUNTER — Other Ambulatory Visit (HOSPITAL_COMMUNITY): Payer: Self-pay | Admitting: Cardiology

## 2023-07-26 ENCOUNTER — Other Ambulatory Visit (HOSPITAL_COMMUNITY): Payer: Self-pay | Admitting: Cardiology

## 2023-07-26 ENCOUNTER — Other Ambulatory Visit (HOSPITAL_COMMUNITY): Payer: Self-pay

## 2023-07-26 ENCOUNTER — Encounter (HOSPITAL_COMMUNITY): Payer: Self-pay

## 2023-07-26 ENCOUNTER — Encounter: Payer: Self-pay | Admitting: Cardiology

## 2023-07-26 MED ORDER — CARVEDILOL 3.125 MG PO TABS
3.1250 mg | ORAL_TABLET | Freq: Two times a day (BID) | ORAL | 0 refills | Status: DC
  Filled 2023-07-26: qty 60, 30d supply, fill #0
  Filled 2023-08-27: qty 60, 30d supply, fill #1
  Filled 2023-09-27: qty 60, 30d supply, fill #2

## 2023-07-27 ENCOUNTER — Other Ambulatory Visit (HOSPITAL_COMMUNITY): Payer: Self-pay

## 2023-07-27 ENCOUNTER — Other Ambulatory Visit: Payer: Self-pay

## 2023-07-27 ENCOUNTER — Other Ambulatory Visit (HOSPITAL_COMMUNITY): Payer: Self-pay | Admitting: Cardiology

## 2023-07-27 MED ORDER — ATORVASTATIN CALCIUM 40 MG PO TABS
40.0000 mg | ORAL_TABLET | Freq: Every day | ORAL | 3 refills | Status: AC
  Filled 2023-07-27 – 2023-08-27 (×2): qty 90, 90d supply, fill #0
  Filled 2023-09-27 – 2023-11-24 (×2): qty 90, 90d supply, fill #1
  Filled 2023-11-30: qty 30, 30d supply, fill #1
  Filled 2023-12-26: qty 30, 30d supply, fill #2
  Filled 2024-02-07: qty 30, 30d supply, fill #3

## 2023-08-28 ENCOUNTER — Other Ambulatory Visit (HOSPITAL_COMMUNITY): Payer: Self-pay

## 2023-08-28 ENCOUNTER — Other Ambulatory Visit: Payer: Self-pay

## 2023-09-07 ENCOUNTER — Ambulatory Visit (HOSPITAL_COMMUNITY): Payer: Self-pay

## 2023-09-13 ENCOUNTER — Ambulatory Visit: Payer: Self-pay | Admitting: Nurse Practitioner

## 2023-09-27 ENCOUNTER — Other Ambulatory Visit: Payer: Self-pay

## 2023-09-27 ENCOUNTER — Other Ambulatory Visit (HOSPITAL_COMMUNITY): Payer: Self-pay

## 2023-09-29 ENCOUNTER — Other Ambulatory Visit (HOSPITAL_COMMUNITY): Payer: Self-pay

## 2023-10-17 ENCOUNTER — Encounter: Payer: Self-pay | Admitting: Cardiology

## 2023-10-17 ENCOUNTER — Other Ambulatory Visit (HOSPITAL_COMMUNITY): Payer: Self-pay | Admitting: Cardiology

## 2023-10-17 MED ORDER — SACUBITRIL-VALSARTAN 24-26 MG PO TABS: 1.0000 | ORAL_TABLET | Freq: Two times a day (BID) | ORAL | 3 refills | Status: AC

## 2023-10-17 NOTE — Telephone Encounter (Signed)
 Error

## 2023-10-19 ENCOUNTER — Telehealth: Payer: Self-pay

## 2023-10-19 ENCOUNTER — Telehealth: Payer: Self-pay | Admitting: Cardiology

## 2023-10-19 ENCOUNTER — Other Ambulatory Visit (HOSPITAL_COMMUNITY): Payer: Self-pay

## 2023-10-19 NOTE — Telephone Encounter (Signed)
 Patient wants to re-sign up for [employer] and wants advice on next steps.

## 2023-10-19 NOTE — Telephone Encounter (Signed)
 Someone from our team will be onsite again on 10/20/23 and will mail patient [employer] app with instructions.

## 2023-10-19 NOTE — Telephone Encounter (Addendum)
 Staff onsite again on 10/20/23 and can mail patient [employer] app with instructions.   App mailed to patient on 10/20/23

## 2023-10-20 ENCOUNTER — Ambulatory Visit (HOSPITAL_COMMUNITY): Payer: Self-pay

## 2023-10-25 ENCOUNTER — Other Ambulatory Visit (HOSPITAL_COMMUNITY): Payer: Self-pay | Admitting: Cardiology

## 2023-10-25 ENCOUNTER — Other Ambulatory Visit: Payer: Self-pay

## 2023-10-26 ENCOUNTER — Other Ambulatory Visit (HOSPITAL_COMMUNITY): Payer: Self-pay

## 2023-10-27 ENCOUNTER — Other Ambulatory Visit (HOSPITAL_COMMUNITY): Payer: Self-pay

## 2023-10-27 MED ORDER — CARVEDILOL 3.125 MG PO TABS
3.1250 mg | ORAL_TABLET | Freq: Two times a day (BID) | ORAL | 0 refills | Status: DC
  Filled 2023-10-27: qty 60, 30d supply, fill #0

## 2023-11-01 ENCOUNTER — Telehealth (HOSPITAL_COMMUNITY): Payer: Self-pay

## 2023-11-01 NOTE — Telephone Encounter (Signed)
 Advanced Heart Failure Patient Advocate Encounter  Received notification from [employer] that patient has been conditionally approved for 2026 but needs to submit updated authorization form. Contacted patient by phone, patient consents to advocate signature for authorization.  Re-enrollment form faxed on 11/01/2023.  Rachel DEL, CPhT Rx Patient Advocate Phone: [phone]

## 2023-11-06 NOTE — Telephone Encounter (Signed)
 Per separate encounter patient app has been sent to [employer]. [employer] is tracking app status, closing encounter.

## 2023-11-10 ENCOUNTER — Other Ambulatory Visit (HOSPITAL_COMMUNITY): Payer: Self-pay

## 2023-11-10 NOTE — Telephone Encounter (Signed)
 Patient was approved to receive Farxiga  from [employer] Effective 01/25/2024 to 01/23/2025

## 2023-11-24 ENCOUNTER — Other Ambulatory Visit (HOSPITAL_COMMUNITY): Payer: Self-pay | Admitting: Cardiology

## 2023-11-24 ENCOUNTER — Other Ambulatory Visit (HOSPITAL_COMMUNITY): Payer: Self-pay

## 2023-11-24 ENCOUNTER — Other Ambulatory Visit: Payer: Self-pay

## 2023-11-24 MED ORDER — CARVEDILOL 3.125 MG PO TABS
3.1250 mg | ORAL_TABLET | Freq: Two times a day (BID) | ORAL | 0 refills | Status: DC
  Filled 2023-11-24: qty 60, 30d supply, fill #0

## 2023-11-24 MED ORDER — FUROSEMIDE 40 MG PO TABS
40.0000 mg | ORAL_TABLET | Freq: Every day | ORAL | 0 refills | Status: DC
  Filled 2023-11-24: qty 30, 30d supply, fill #0

## 2023-11-27 ENCOUNTER — Other Ambulatory Visit (HOSPITAL_COMMUNITY): Payer: Self-pay

## 2023-11-27 ENCOUNTER — Encounter: Payer: Self-pay | Admitting: Radiology

## 2023-11-30 ENCOUNTER — Other Ambulatory Visit (HOSPITAL_COMMUNITY): Payer: Self-pay

## 2023-12-01 ENCOUNTER — Other Ambulatory Visit (HOSPITAL_COMMUNITY): Payer: Self-pay

## 2023-12-26 ENCOUNTER — Other Ambulatory Visit (HOSPITAL_COMMUNITY): Payer: Self-pay

## 2023-12-26 ENCOUNTER — Other Ambulatory Visit: Payer: Self-pay

## 2023-12-26 ENCOUNTER — Other Ambulatory Visit (HOSPITAL_COMMUNITY): Payer: Self-pay | Admitting: Cardiology

## 2023-12-27 ENCOUNTER — Other Ambulatory Visit (HOSPITAL_COMMUNITY): Payer: Self-pay

## 2023-12-27 MED ORDER — CARVEDILOL 3.125 MG PO TABS
3.1250 mg | ORAL_TABLET | Freq: Two times a day (BID) | ORAL | 0 refills | Status: DC
  Filled 2023-12-27: qty 60, 30d supply, fill #0

## 2023-12-27 MED ORDER — SPIRONOLACTONE 25 MG PO TABS
25.0000 mg | ORAL_TABLET | Freq: Every day | ORAL | 6 refills | Status: AC
  Filled 2023-12-27: qty 30, 30d supply, fill #0
  Filled 2024-01-25: qty 30, 30d supply, fill #1
  Filled 2024-02-23: qty 30, 30d supply, fill #2

## 2023-12-27 MED ORDER — FUROSEMIDE 40 MG PO TABS
40.0000 mg | ORAL_TABLET | Freq: Every day | ORAL | 0 refills | Status: DC
  Filled 2023-12-27: qty 30, 30d supply, fill #0

## 2023-12-28 ENCOUNTER — Other Ambulatory Visit (HOSPITAL_COMMUNITY): Payer: Self-pay

## 2024-01-08 ENCOUNTER — Ambulatory Visit: Payer: Self-pay | Admitting: Nurse Practitioner

## 2024-01-22 ENCOUNTER — Other Ambulatory Visit (HOSPITAL_COMMUNITY): Payer: Self-pay

## 2024-01-22 ENCOUNTER — Telehealth (HOSPITAL_COMMUNITY): Payer: Self-pay

## 2024-01-22 NOTE — Telephone Encounter (Signed)
 Advanced Heart Failure Patient Advocate Encounter  Patient contacted office with concerns regarding Entresto  refills. Spoke to patient by phone, pt is currently uninsured. Advised patient that the best pricing I can currently find is through [facility] (current pricing $22.25 for 60 tablets 30 days). Patient is agreeable to using this pharmacy for Sacubitril -Valsartan  going forward. I have messaged office staff to have a new rx sent in.  Rachel DEL, CPhT Rx Patient Advocate Phone: [phone]

## 2024-01-25 ENCOUNTER — Other Ambulatory Visit (HOSPITAL_COMMUNITY): Payer: Self-pay | Admitting: Cardiology

## 2024-01-26 ENCOUNTER — Other Ambulatory Visit: Payer: Self-pay

## 2024-01-26 ENCOUNTER — Other Ambulatory Visit (HOSPITAL_COMMUNITY): Payer: Self-pay

## 2024-01-26 MED ORDER — FUROSEMIDE 40 MG PO TABS
40.0000 mg | ORAL_TABLET | Freq: Every day | ORAL | 0 refills | Status: DC
  Filled 2024-01-26: qty 30, 30d supply, fill #0

## 2024-01-26 MED ORDER — CARVEDILOL 3.125 MG PO TABS
3.1250 mg | ORAL_TABLET | Freq: Two times a day (BID) | ORAL | 0 refills | Status: DC
  Filled 2024-01-26: qty 60, 30d supply, fill #0

## 2024-02-08 ENCOUNTER — Other Ambulatory Visit (HOSPITAL_COMMUNITY): Payer: Self-pay

## 2024-02-14 ENCOUNTER — Ambulatory Visit: Payer: Self-pay | Admitting: Nurse Practitioner

## 2024-02-23 ENCOUNTER — Other Ambulatory Visit (HOSPITAL_COMMUNITY): Payer: Self-pay

## 2024-02-23 ENCOUNTER — Other Ambulatory Visit (HOSPITAL_COMMUNITY): Payer: Self-pay | Admitting: Cardiology

## 2024-02-23 ENCOUNTER — Other Ambulatory Visit: Payer: Self-pay

## 2024-02-23 MED ORDER — CARVEDILOL 3.125 MG PO TABS
3.1250 mg | ORAL_TABLET | Freq: Two times a day (BID) | ORAL | 0 refills | Status: AC
  Filled 2024-02-23: qty 60, 30d supply, fill #0

## 2024-02-23 MED ORDER — FUROSEMIDE 40 MG PO TABS
40.0000 mg | ORAL_TABLET | Freq: Every day | ORAL | 0 refills | Status: AC
  Filled 2024-02-23: qty 30, 30d supply, fill #0
